# Patient Record
Sex: Female | Born: 1940 | Race: White | Hispanic: No | Smoking: Former smoker
Health system: Southern US, Community
[De-identification: ages and names within clinical notes are randomized; demographics above are authoritative.]

## PROBLEM LIST (undated history)

## (undated) DIAGNOSIS — Z9889 Other specified postprocedural states: Secondary | ICD-10-CM

## (undated) DIAGNOSIS — I951 Orthostatic hypotension: Secondary | ICD-10-CM

## (undated) DIAGNOSIS — I471 Supraventricular tachycardia, unspecified: Secondary | ICD-10-CM

## (undated) DIAGNOSIS — J449 Chronic obstructive pulmonary disease, unspecified: Secondary | ICD-10-CM

## (undated) DIAGNOSIS — M5410 Radiculopathy, site unspecified: Secondary | ICD-10-CM

## (undated) DIAGNOSIS — Z9289 Personal history of other medical treatment: Secondary | ICD-10-CM

## (undated) DIAGNOSIS — I10 Essential (primary) hypertension: Secondary | ICD-10-CM

## (undated) DIAGNOSIS — R51 Headache: Secondary | ICD-10-CM

## (undated) DIAGNOSIS — Z90710 Acquired absence of both cervix and uterus: Secondary | ICD-10-CM

## (undated) DIAGNOSIS — I671 Cerebral aneurysm, nonruptured: Secondary | ICD-10-CM

## (undated) DIAGNOSIS — R5383 Other fatigue: Secondary | ICD-10-CM

## (undated) DIAGNOSIS — I341 Nonrheumatic mitral (valve) prolapse: Secondary | ICD-10-CM

## (undated) DIAGNOSIS — F419 Anxiety disorder, unspecified: Secondary | ICD-10-CM

## (undated) HISTORY — DX: Chronic obstructive pulmonary disease, unspecified: J44.9

## (undated) HISTORY — PX: ABDOMINAL HYSTERECTOMY: SHX81

## (undated) HISTORY — DX: Essential (primary) hypertension: I10

## (undated) HISTORY — DX: Radiculopathy, site unspecified: M54.10

## (undated) HISTORY — DX: Supraventricular tachycardia: I47.1

## (undated) HISTORY — PX: NASAL SEPTUM SURGERY: SHX37

## (undated) HISTORY — DX: Nonrheumatic mitral (valve) prolapse: I34.1

## (undated) HISTORY — PX: CATARACT EXTRACTION: SUR2

## (undated) HISTORY — PX: CHOLECYSTECTOMY: SHX55

## (undated) HISTORY — DX: Acquired absence of both cervix and uterus: Z90.710

## (undated) HISTORY — PX: ROTATOR CUFF REPAIR: SHX139

## (undated) HISTORY — DX: Supraventricular tachycardia, unspecified: I47.10

## (undated) HISTORY — DX: Anxiety disorder, unspecified: F41.9

## (undated) HISTORY — PX: APPENDECTOMY: SHX54

## (undated) HISTORY — DX: Orthostatic hypotension: I95.1

## (undated) HISTORY — DX: Headache: R51

## (undated) HISTORY — DX: Personal history of other medical treatment: Z92.89

## (undated) HISTORY — DX: Other specified postprocedural states: Z98.890

## (undated) HISTORY — DX: Cerebral aneurysm, nonruptured: I67.1

## (undated) HISTORY — DX: Other fatigue: R53.83

---

## 1994-07-24 HISTORY — PX: CARDIAC CATHETERIZATION: SHX172

## 2000-08-23 ENCOUNTER — Ambulatory Visit (HOSPITAL_COMMUNITY): Admission: RE | Admit: 2000-08-23 | Discharge: 2000-08-23 | Payer: Self-pay | Admitting: Family Medicine

## 2001-05-25 ENCOUNTER — Encounter: Payer: Self-pay | Admitting: Emergency Medicine

## 2001-05-25 ENCOUNTER — Inpatient Hospital Stay (HOSPITAL_COMMUNITY): Admission: EM | Admit: 2001-05-25 | Discharge: 2001-05-26 | Payer: Self-pay | Admitting: Emergency Medicine

## 2002-04-14 ENCOUNTER — Encounter: Admission: RE | Admit: 2002-04-14 | Discharge: 2002-04-14 | Payer: Self-pay | Admitting: Orthopaedic Surgery

## 2002-04-14 ENCOUNTER — Encounter: Payer: Self-pay | Admitting: Orthopaedic Surgery

## 2003-05-18 ENCOUNTER — Ambulatory Visit (HOSPITAL_COMMUNITY): Admission: RE | Admit: 2003-05-18 | Discharge: 2003-05-18 | Payer: Self-pay | Admitting: Gastroenterology

## 2004-07-24 ENCOUNTER — Inpatient Hospital Stay (HOSPITAL_COMMUNITY): Admission: EM | Admit: 2004-07-24 | Discharge: 2004-07-25 | Payer: Self-pay | Admitting: Emergency Medicine

## 2006-10-13 ENCOUNTER — Emergency Department (HOSPITAL_COMMUNITY): Admission: EM | Admit: 2006-10-13 | Discharge: 2006-10-13 | Payer: Self-pay | Admitting: Emergency Medicine

## 2010-07-03 ENCOUNTER — Encounter: Admission: RE | Admit: 2010-07-03 | Discharge: 2010-07-03 | Payer: Self-pay | Admitting: Neurology

## 2011-01-09 NOTE — H&P (Signed)
[place]  Patient:    Barbara Hill, Barbara Hill Visit Number: [id number] MRN: [id number]          Service Type: MED Location: [phone] Attending Physician:  Devoria Albe Dictated by:   Marya Fossa, P.A. Admit Date:  05/25/2001   CC:         Richard A. Alanda Amass, M.D.   History and Physical  DATE OF BIRTH: 1941/02/14  [facility] MR#: [id number].  ADMISSION DIAGNOSES:  1. Chest pain, rule out myocardial infarction.  2. History of supraventricular tachycardia  3. Mitral valve prolapse.  4. History of normal coronary arteries, 1995.  5. Tobacco abuse.  6. Situational depression.  CHIEF COMPLAINT: Chest pain.  HISTORY OF PRESENT ILLNESS: This is a 70 year old widowed white female patient, with SVT and mitral valve prolapse and history of normal coronaries in 1995.  Around 8 a.m. today she developed substernal crushing chest pain through to her back and down to her waist.  No shortness of breath, nausea, or diaphoresis.  There was a warm sensation like indigestion and she tried Tums, Zantac, and Gas-X without relief.  This lasted about an hour.  She called EMS and was given one sublingual nitroglycerin with relief of her symptoms.  Her symptoms returned.  She was given a total of two more nitroglycerin and finally had relief.  She was at work when the symptoms started but she was not exerting herself at that time.  She has never felt this way before.  Of note, since Sunday she has been feeling like she is developing "flu-like symptoms," but has had no fever.  Her heart catheterization in 1995 was done secondary to new onset SVT/PAF.  ALLERGIES:  1. DARVOCET.  2. EPINEPHRINE.  MEDICATIONS:  1. Tambocor 100 mg b.i.d.  2. Toprol XL 50 mg q.d.  3. Premarin 0.9 mg q.d.  4. Prozac 20 mg q.d.  5. Allegra as needed.  6. No aspirin. (No reason).  PAST MEDICAL HISTORY:  1. SVT/PAF.  2. Catheterization December 1995,  with normal coronaries and normal LV.  3. A 2D echocardiogram in November 2001 with normal EF.  Mitral valve     prolapse, mild MR, left atrial enlargement, mild pulmonary hypertension.  4. Persantine Cardiolite August 2001 with no ischemia, EF normal.  5. SBE prophylaxis.  6. History of right rotator cuff repair, hysterectomy, appendectomy,     cholecystectomy.  FAMILY HISTORY: Mother died at age 67 of MI and TIA.  Father alive at age 34 and well.  She has three brothers who are alive and well.  One is deceased, he died four weeks ago of heart trouble.  One sister alive and well.  SOCIAL HISTORY: The patient is widowed x 4 weeks.  Her husband was disabled and sick for some time, and died approximately four weeks ago of heart disease.  She is the mother of two, grandmother of four.  She smokes over a pack a day, and lives with her 38 year old grandson.  She states she is coping with the death of her brother and husband, who died one day between each other.  She thinks she is still in shock.  REVIEW OF SYSTEMS: No fever.  Positive chills.  No cough.  Positive depression.  Positive indigestion, which has been worse lately.  No weight changes.  No melena or hematuria.  PHYSICAL EXAMINATION:  VITAL SIGNS: Blood pressure 121/48, pulse 62, respirations 24.  GENERAL: Alert and oriented x  3.  No acute distress.  HEENT: Normocephalic, atraumatic.  PERRL.  EOMI.  Nares patent.  Pharynx clear.  NECK: Supple without bruits or masses.  LUNGS: Clear to auscultation.  COR: Regular rate and rhythm without murmurs, rubs, or gallops.   Nontender to palpation.  ABDOMEN: Soft, nontender, nondistended.  Normoactive bowel sounds x 4 quadrants.  No HSM.  No bruits.  EXTREMITIES: Right femoral bruit, which is faint; 2+ femoral pulses; 2+ distal pulses; without edema.  NEUROLOGIC: Examination nonfocal.  LABORATORY DATA: EKG shows sinus rhythm with poor R wave progression in V leads.  Will  check old EKG.  Chest x-ray shows bibasilar atelectasis, no infiltrates, no CHF.  WBC 9.0, hemoglobin 13.0, platelets 339,000.  INR 1.1.  Potassium 3.6, BUN 12, creatinine 0.8, glucose 103.  Amylase 82, lipase 28.  UA negative.  CK 25, MB 0.3.  Troponin I less than 0.01.  IMPRESSION:  1. Chest pain, worrisome for angina.  2. Tobacco abuse.  3. History of supraventricular tachycardia.  4. Mitral valve prolapse.  5. Unknown lipid status.  6. Depression, situational.  7. Hypokalemia, borderline.  PLAN: Will admit the patient.  Will treat her with IV heparin, IV nitroglycerin, and IV fluids.  Beta-blocker, aspirin, Plavix.  Consider ACE inhibitor therapy, especially if she has blockages.  Will check a fasting lipid profile and follow serial cardiac enzymes.  I have discussed tobacco cessation and will start her on Wellbutrin.  Depression.  I have asked her if she would like to speak to somebody about her depression, and she states she is all right at this time.  She has some counselors at work that she is planning to see.  We will plan cardiac catheterization tomorrow. Dictated by:   Marya Fossa, P.A. Attending Physician:  Devoria Albe DD:  05/25/01 TD:  05/25/01 Job: [id number]

## 2011-01-09 NOTE — Discharge Summary (Signed)
NAMEANNELIES, COYT                ACCOUNT NO.:  [id number]   MEDICAL RECORD NO.:  [id number]          PATIENT TYPE:  INP   LOCATION:  3731                         FACILITY:  [facility]   PHYSICIAN:  Darlin Priestly, MD  DATE OF BIRTH:  [birth year]   DATE OF ADMISSION:  07/24/2004  DATE OF DISCHARGE:  07/25/2004                                 DISCHARGE SUMMARY   DISCHARGE DIAGNOSES:  1.  Chest pain, myocardial infarction ruled out.  2.  History of normal coronaries in October of 2002.  3.  Normal left ventricular function.  4.  Documented gastritis by prior endoscopy in September of 2004.  Seen by      Anselmo Rod, M.D., at that time.  5.  Smoking.  6.  History of supraventricular tachycardia treated with flecainide and      Toprol.   HISTORY OF PRESENT ILLNESS:  The patient is a 70 year old female followed by  Dr. Jenne Campus and Rema Fendt, F.N.P., with a history of SVT.  She was  admitted on July 24, 2004, with chest pain.  She apparently had not been  on a PPI.  She was recently started on an antibiotic for sinusitis.   HOSPITAL COURSE:  She was admitted to telemetry, started on heparin and  ruled out for an MI.  We feel that she can be discharged on July 25, 2004.  She has an outpatient Cardiolite study scheduled for next week,  Wednesday, July 30, 2004, at 12:30 p.m.  Helicobacter pylori was done  prior to discharge.  She also had a full TFT profile one prior to discharge.  Her TSH is 0.47.  Followup TFTs are pending.   DISCHARGE MEDICATIONS:  1.  Tambocor 100 mg twice a day.  2.  Toprol XL 50 mg a day.  3.  Amoxicillin 500 mg twice a day for six more days.  4.  Aspirin 81 mg a day.  5.  Protonix 40 mg twice a day for one month and then once a day.   LABORATORIES:  CK-MBs and troponins are negative.  INR is 1.3.  The TSH is  0.47.  Sodium 136, potassium 3.7, BUN 7, creatinine 0.6.  Liver functions  are normal.  Lipase is normal.  White count 6.3, hemoglobin  13.7, hematocrit  39.2, platelets 307.  The differential is unremarkable.  EKG revealed sinus  bradycardia with a rate of 55.  The QTC is 419.  CT of her chest revealed no  aortic dissection or aneurysm and small bilateral lung nodules most likely  granuloma.  CT of the abdomen showed no acute abnormality.   DISPOSITION:  The patient is discharged in stable condition.   FOLLOWUP:  Will follow up with Dr. Jenne Campus on Wednesday, August 06, 2004.  She will have a Cardiolite study done on Wednesday, July 30, 2004, at  12:30 p.m.      Antonieta Loveless  D:  07/25/2004  T:  07/26/2004  Job:  [id number]   cc:   Rema Fendt, F.N.P.  [facility]

## 2011-01-09 NOTE — Op Note (Signed)
NAME:  Barbara Hill, Barbara Hill                          ACCOUNT NO.:  [id number]   MEDICAL RECORD NO.:  [id number]                   PATIENT TYPE:  AMB   LOCATION:  ENDO                                 FACILITY:  [facility]   PHYSICIAN:  Anselmo Rod, M.D.               DATE OF BIRTH:  [birth year]   DATE OF PROCEDURE:  05/18/2003  DATE OF DISCHARGE:                                 OPERATIVE REPORT   PROCEDURE:  Screening colonoscopy.   ENDOSCOPIST:  Anselmo Rod, M.D.   INSTRUMENT USED:  Olympus video colonoscope.   INDICATIONS FOR PROCEDURE:  Abnormal weight loss with a family history of  colon cancer in a maternal aunt in a  70 year old white female rule out  colonic polyps, masses, etc.   PREPROCEDURE PREPARATION:  Informed consent was obtained from the patient  and the patient was  fasted for 8 hours prior to  the procedure and prepped  with a bottle of magnesium citrate and a gallon of GoLYTELY the  night prior  to the procedure.   PREPROCEDURE PHYSICAL:  The patient had stable vital signs. Neck supple.  Chest clear to auscultation, S1, S2 regular. Abdomen soft with normoactive  bowel sounds.   DESCRIPTION OF PROCEDURE:  The patient was placed in the left lateral  decubitus position and sedated with an additional 40  mg of Demerol and 4 mg  of Versed intravenously. Once sedation was adequate, the patient was  maintained on low flow oxygen and continuous cardiac monitoring.   The Olympus video colonoscope was advanced from the rectum to the cecum with  difficulty. The patient had a very tortuous colon. The patient's position  was changed from the left lateral  to the supine and the right lateral  position  with gentle application of abdominal  pressure to reach the cecal  base. No masses, polyps, erosions or ulcerations or diverticula were seen.  Small internal hemorrhoids were appreciated on retroflexion in the rectum.  The appendiceal orifice and ileocecal valve were  visualized and  photographed.   IMPRESSION:  1. Normal colonoscopy up to the cecum except for a tortuous colon.  2. Small nonbleeding internal hemorrhoids seen on retroflexion.  3. No masses or polyps seen, no evidence of diverticulosis.    RECOMMENDATIONS:  1. Continue a high fiber diet with liberal fluid intake.  2. Repeat colorectal cancer in the next 5 years unless the patient develops     any abnormal symptoms in the interim.  3. Outpatient follow up in the next 2 weeks for further workup of abnormal     weight loss.                                               Anselmo Rod, M.D.  JNM/MEDQ  D:  05/18/2003  T:  05/19/2003  Job:  [id number]   cc:   Teena Irani. Arlyce Dice, M.D.  [address]  [place]  [place] [address]  Fax: [phone]

## 2011-01-09 NOTE — Discharge Summary (Signed)
[place]. [facility]  Patient:    Barbara Hill, Barbara Hill Visit Number: [id number] MRN: [id number]          Service Type: MED Location: [phone] Attending Physician:  Berry, Jonathan [place] Dictated by:   Halford Decamp Delanna Ahmadi, R.N., N.P. Admit Date:  05/25/2001 Discharge Date: 05/26/2001   CC:         [facility]   Discharge Summary  HISTORY OF PRESENT ILLNESS:  Barbara Hill is a 70 year old white widowed female patient of Dr. Pearletha Furl. Alanda Amass, who came in to the hospital with an indigestion-type pain occurring at 8 oclock in the morning.  She took Tums and Zantac and Gas-X without any relief.  She had crushing substernal chest pain to her back and down to her waist.  She had no shortness of breath, no nausea, and no diaphoresis.  The duration lasted one hour.  She took a sublingual nitroglycerin by EMS and the pain was relieved.  She was given two more nitroglycerin with relief.  She apparently never had this pain in the past.  She did have a catheterization in 1995 where she had some SVT and the catheter was negative.  HOSPITAL COURSE:  She was admitted, put on IV heparin, IV nitroglycerin, a beta blocker, aspirin, and Plavix and she was seen by Dr. Madaline Savage who thought she should undergo cardiac catheterization.  This was performed on May 26, 2001 by Dr. Lennette Bihari.  Coronaries were clean.  She had no CAD.  She had mild MVP.  She had normal LV function.  Thus, it was decided that she could be discharged home later in the day.  Her discharge at this time is pending.  Also to note, she has had a lot of family stress.  Apparently, her husband died four weeks ago and a brother died the day after, which may be a playing symptomatology with her chest pain.  DISCHARGE MEDICATIONS: 1. Enteric-coated aspirin 325 mg 1 q.d. 2. Flecainide 100 mg b.i.d. 3. Prozac 20 mg 1 q.d. 4. Protonix 40 mg 1 q.d. 5. Toprol XL 50 mg 1  q.d.  LABORATORY DATA:  May 26, 2001, a hemoglobin was 11.6, hematocrit 34.2, wbcs 6.0, platelets were 274,000.  Sodium was 139, potassium was 3.5, glucose was 104, BUN was 7, creatinine was 0.6.  CK-MBs were negative x 3.  There is no chest x-ray in the chart at the time of this dictation.  The one on the computer says that she has bilateral basilar atelectasis, borderline cardiomegaly.  DISCHARGE DIAGNOSES: 1. Chest pain, not cardiac ischemia etiology with no cors by cardiac    catheterization May 26, 2001. 2. History of supraventricular tachycardia on flecainide. 3. Depression. 4. Grieving process. 5. Indigestion.  ACTIVITY:  Her activity at the time of discharge is to do no strenuous activity, no driving, no lifting for four days.  FOLLOW-UP:  She will follow up with Mancel Bale, P.A., at 10:20 on June 09, 2001. Dictated by:   Halford Decamp Delanna Ahmadi, R.N., N.P. Attending Physician:  Berry, Jonathan [place] DD:  05/26/01 TD:  05/26/01 Job: [id number]

## 2011-01-09 NOTE — Op Note (Signed)
NAME:  Barbara Hill, Barbara Hill                          ACCOUNT NO.:  [id number]   MEDICAL RECORD NO.:  [id number]                   PATIENT TYPE:  AMB   LOCATION:  ENDO                                 FACILITY:  [facility]   PHYSICIAN:  Anselmo Rod, M.D.               DATE OF BIRTH:  07-03-1941   DATE OF PROCEDURE:  05/18/2003  DATE OF DISCHARGE:                                 OPERATIVE REPORT   PROCEDURE:  Esophagogastroduodenoscopy.   ENDOSCOPIST:  Anselmo Rod, M.D.   INSTRUMENT USED:  Olympus video panendoscope.   INDICATIONS FOR PROCEDURE:  A 70 year old white female with a history of  abnormal weight loss, undergoing EGD to rule out peptic ulcer disease,  esophagitis, gastritis, etc. The patient has had a history of occasional  epigastric discomfort.   PREPROCEDURE PREPARATION:  Informed consent was obtained from the patient.  The patient was  fasted for 8 hours prior to the procedure and given Cipro  400 mg intravenously for MVP prophylaxis prior to the procedure.   PREPROCEDURE PHYSICAL:  The patient had stable vital signs, neck supple,  chest clear to auscultation, S1, S2 regular, abdomen soft with normal bowel  sounds.   DESCRIPTION OF PROCEDURE:  The patient was placed in the left lateral  decubitus position and sedated with 40 mg of Demerol, and 4 mg of Versed  intravenously. Once the patient was adequately sedated and maintained on low  flow oxygen and continuous cardiac monitoring, the Olympus video  panendoscope was advanced through the mouth piece over the tongue into the  esophagus under direct visualization.   The entire esophagus appeared normal with no evidence of rings, stricture,  masses, esophagitis or Barrett's mucosa. The scope was then advanced into  the stomach. Mild, diffuse gastritis was noted throughout the gastric mucosa  but no ulcers, erosions, masses or polyps were seen. Retroflexion in the  high cardia revealed  no abnormalities. The proximal  small bowel appeared  normal as well.   IMPRESSION:  Normal esophagogastroduodenoscopy except for mild diffuse  gastritis. No ulcers or masses seen.   RECOMMENDATIONS:  1. Trial of H2 blockers like Pepcid or Zantac has been recommended.  2. Avoid nonsteroidals including aspirin .  3.     Follow anti reflux measures.  4. Proceed with a colonoscopy at this time. Further  recommendations will be     made after the colonoscopy has been done.                                               Anselmo Rod, M.D.    JNM/MEDQ  D:  05/18/2003  T:  05/19/2003  Job:  [id number]   cc:   Teena Irani. Arlyce Dice, M.D.  [address]  [place]  [place] [address]  Fax: [phone]

## 2011-01-09 NOTE — Cardiovascular Report (Signed)
[place]. [facility]  Patient:    Barbara Hill, Barbara Hill Visit Number: [id number] MRN: [id number]          Service Type: MED Location: [phone] Attending Physician:  Berry, Jonathan [place] Dictated by:   Lennette Bihari, M.D., [facility] Proc. Date: 05/26/01 Admit Date:  05/25/2001   CC:         Richard A. Alanda Amass, M.D.  Cardiac Catheterization Lab  Janeece Riggers  [facility]   Cardiac Catheterization  PROCEDURE: 1. Left heart catheterization. 2. Cine coronary angiography. 3. Biplane cine left ventriculography. 4. Distal aortography.  CARDIOLOGIST:  Lennette Bihari, M.D.  INDICATIONS:  Barbara Hill is a 70 year old white female with a history of mitral valve prolapse and documented supraventricular tachycardia on Tambocor and Toprol.  She has ongoing tobacco use.  The patient was admitted to [facility] yesterday with a symptom complex worrisome for possible unstable angina.  She was started on anticoagulation and IV nitroglycerin.  She is now referred for definitive diagnostic catheterization.  HEMODYNAMIC DATA: 1. Central aortic pressure was 165/76. 2. Left ventricular pressure 165/24.  ANGIOGRAPHIC DATA:  Left main coronary artery was angiographically normal and bifurcated into an LAD and left circumflex system.  The LAD was angiographically normal and gave rise to a major proximal diagonal vessel and several small septal perforating arteries.  The circumflex vessel was angiographically normal and gave rise to a bifurcating obtuse marginal vessel.  The right coronary artery was angiographically normal and gave rise to a PDA and posterolateral vessel.  Biplane cine left ventriculography revealed normal LV function without focal segmental wall motion abnormalities.  There was a mild angiographic mitral valve prolapse.  Distal aortography did not demonstrate any significant aortoiliac  disease. There was no evidence for renal artery stenosis.  IMPRESSION: 1. Normal left ventricular function. 2. Mild angiographic mitral valve prolapse. 3. Normal coronary arteries. 4. Normal aortoiliac system. Dictated by:   Lennette Bihari, M.D., [facility] Attending Physician:  Berry, Jonathan [place] DD:  05/26/01 TD:  05/26/01 Job: [id number]

## 2011-04-17 ENCOUNTER — Encounter: Payer: Self-pay | Admitting: Internal Medicine

## 2011-04-20 ENCOUNTER — Encounter: Payer: Self-pay | Admitting: Internal Medicine

## 2011-04-20 ENCOUNTER — Ambulatory Visit (INDEPENDENT_AMBULATORY_CARE_PROVIDER_SITE_OTHER): Payer: Medicare PPO | Admitting: Internal Medicine

## 2011-04-20 DIAGNOSIS — I471 Supraventricular tachycardia: Secondary | ICD-10-CM

## 2011-04-20 DIAGNOSIS — I498 Other specified cardiac arrhythmias: Secondary | ICD-10-CM

## 2011-04-20 DIAGNOSIS — I059 Rheumatic mitral valve disease, unspecified: Secondary | ICD-10-CM

## 2011-04-20 DIAGNOSIS — I119 Hypertensive heart disease without heart failure: Secondary | ICD-10-CM

## 2011-04-20 DIAGNOSIS — F17200 Nicotine dependence, unspecified, uncomplicated: Secondary | ICD-10-CM

## 2011-04-20 MED ORDER — FLECAINIDE ACETATE 100 MG PO TABS: 100.0000 mg | ORAL_TABLET | Freq: Two times a day (BID) | ORAL | Status: DC

## 2011-04-20 NOTE — Patient Instructions (Signed)
Your physician has requested that you have a lexiscan myoview. For further information please visit [email or web address]. Please follow instruction sheet, as given.   

## 2011-04-20 NOTE — Progress Notes (Signed)
HPI Patient is a 70 year old who is referred for evaluation of palpitations.  Seen by Laurell Josephs.  Last seen in 2005. Has been on Tambocor rx'd by R. Alanda Amass.  Has done wonderfully.   Last spell of palpitations in 1996.    Occasaional beats a little fast if drinks caffeine.  Tries to avoid.  Last spell of it beating a little fast was over 1 year ago.  Shortlived. No chest pains.  Breathing ok.  Active.    Allergies  Allergen Reactions  . Biaxin   . Darvocet (Propoxyphene N-Acetaminophen)   . Doxycycline   . Epinephrine   . Penicillins   . Prednisone     Current Outpatient Prescriptions  Medication Sig Dispense Refill  . ALPRAZolam (XANAX) 0.5 MG tablet Take 0.5 mg by mouth 2 (two) times daily.       . butalbital-acetaminophen-caffeine (FIORICET WITH CODEINE) 50-325-40-30 MG per capsule Take 1 capsule by mouth every 4 (four) hours as needed.        . cyanocobalamin 1000 MCG tablet Take 100 mcg by mouth daily.        . fish oil-omega-3 fatty acids 1000 MG capsule Take 2 g by mouth daily.        . flecainide (TAMBOCOR) 100 MG tablet Take 100 mg by mouth 2 (two) times daily.        Marland Kitchen FLUoxetine (PROZAC) 40 MG capsule Take 40 mg by mouth daily.        Marland Kitchen lisinopril-hydrochlorothiazide (PRINZIDE,ZESTORETIC) 20-12.5 MG per tablet Take 1 tablet by mouth daily.        . metoprolol (TOPROL-XL) 50 MG 24 hr tablet Take 50 mg by mouth daily.        Marland Kitchen omeprazole (PRILOSEC) 40 MG capsule Take 40 mg by mouth daily.        . vitamin E 400 UNIT capsule Take 400 Units by mouth daily.          Past Medical History  Diagnosis Date  . Arrhythmia     palps  . Fatigue   . Dizziness   . Hypertension   . SVT (supraventricular tachycardia)   . Mitral valve prolapse   . Headache   . UTI (lower urinary tract infection)   . Anxiety   . COPD (chronic obstructive pulmonary disease)   . Radiculopathy     No past surgical history on file.  Family History  Problem Relation Age of Onset  . Heart  attack Other   . Asthma Other   . Heart failure Other   . Osteoporosis Other     History   Social History  . Marital Status: Divorced    Spouse Name: N/A    Number of Children: N/A  . Years of Education: N/A   Occupational History  . Not on file.   Social History Main Topics  . Smoking status: Current Everyday Smoker  . Smokeless tobacco: Not on file  . Alcohol Use:   . Drug Use:   . Sexually Active:    Other Topics Concern  . Not on file   Social History Narrative  . No narrative on file    Review of Systems:  All systems reviewed.  They are negative to the above problem except as previously stated. Still smoking   Trying to quit.   Less than a ppd. Dizziness with otitis media.  Picking up antibotics today. Having fevers and chills. Not dizzy prior.  Vital Signs: BP 92/60  Pulse 52  Ht 5\' 2"  (1.575 m)  Wt 130 lb (58.968 kg)  BMI 23.78 kg/m2  Physical Exam Patient a thin 70 yo in NAD  HEENT:  Normocephalic, atraumatic. EOMI, PERRLA.  Neck: JVP is normal. No thyromegaly. No bruits.  Lungs: clear to auscultation. No rales no wheezes.   Rhonchi that clear with cough. Heart: Regular rate and rhythm. Normal S1, S2. No S3.   No significant murmurs. PMI not displaced.  Abdomen:  Supple, nontender. Normal bowel sounds. No masses. No hepatomegaly.  Extremities:   Good distal pulses throughout. No lower extremity edema.  Musculoskeletal :moving all extremities.  Neuro:   alert and oriented x3.  CN II-XII grossly intact.  EKG: Sinus bradycardia.  59 bpm.  First degree AV block.   Assessment and Plan:

## 2011-04-21 DIAGNOSIS — I059 Rheumatic mitral valve disease, unspecified: Secondary | ICD-10-CM | POA: Insufficient documentation

## 2011-04-21 DIAGNOSIS — I471 Supraventricular tachycardia: Secondary | ICD-10-CM | POA: Insufficient documentation

## 2011-04-21 DIAGNOSIS — F17200 Nicotine dependence, unspecified, uncomplicated: Secondary | ICD-10-CM | POA: Insufficient documentation

## 2011-04-21 NOTE — Assessment & Plan Note (Signed)
No murmur or click on exam.  If has MVP must be intermitt and mild.  Will review outside records.

## 2011-04-21 NOTE — Assessment & Plan Note (Signed)
Patient doing well on current regimen.  I have asked her to sign a release of information to get records for [facility].  I have discussed with J Allred. Given her age I would recomm a stress myoview to rule out inducible ischemia.  Plan periodic f/u after.

## 2011-04-21 NOTE — Assessment & Plan Note (Signed)
Counselled on tobacco risks and quitting. Rec she have ABx filled

## 2011-04-29 ENCOUNTER — Encounter: Payer: Self-pay | Admitting: *Deleted

## 2011-05-04 ENCOUNTER — Encounter: Payer: Self-pay | Admitting: Internal Medicine

## 2011-05-06 ENCOUNTER — Other Ambulatory Visit (HOSPITAL_COMMUNITY): Payer: Self-pay | Admitting: Radiology

## 2011-05-07 ENCOUNTER — Ambulatory Visit (HOSPITAL_COMMUNITY): Payer: Medicare PPO | Attending: Internal Medicine | Admitting: Radiology

## 2011-05-07 VITALS — Ht 63.0 in | Wt 130.0 lb

## 2011-05-07 DIAGNOSIS — I10 Essential (primary) hypertension: Secondary | ICD-10-CM | POA: Insufficient documentation

## 2011-05-07 DIAGNOSIS — R002 Palpitations: Secondary | ICD-10-CM

## 2011-05-07 DIAGNOSIS — R0989 Other specified symptoms and signs involving the circulatory and respiratory systems: Secondary | ICD-10-CM

## 2011-05-07 MED ORDER — TECHNETIUM TC 99M TETROFOSMIN IV KIT
11.0000 | PACK | Freq: Once | INTRAVENOUS | Status: AC | PRN
  Administered 2011-05-07: 11 via INTRAVENOUS

## 2011-05-07 MED ORDER — ATROPINE SULFATE 0.1 MG/ML IJ SOLN
1.0000 mg | Freq: Once | INTRAMUSCULAR | Status: AC
  Administered 2011-05-07: 1 mg via INTRAVENOUS

## 2011-05-07 MED ORDER — SODIUM CHLORIDE 0.9 % IV SOLN
40.0000 ug/kg | Freq: Once | INTRAVENOUS | Status: AC
  Administered 2011-05-07: 40 ug/kg/min via INTRAVENOUS

## 2011-05-07 NOTE — Progress Notes (Signed)
[facility] SITE 3 NUCLEAR MED [address] [place] [address] [phone]  Cardiology Nuclear Med Study  ALYRIA KRACK is a 70 y.o. female [id number] 07-29-1941   Nuclear Med Background Indication for Stress Test:  Evaluation for Ischemia History:   Cardiac Risk Factors: Family History - CAD, Hypertension and Smoker  Symptoms:     Nuclear Pre-Procedure Caffeine/Decaff Intake:  5:00am NPO After: 11:30pm   Lungs:  *** IV 0.9% NS with Angio Cath:  20g  IV Site: R Antecubital  IV Started by:  Stanton Kidney, EMT-P  Chest Size (in):  34 Cup Size: B  Height: 5\' 3"  (1.6 m)  Weight:  130 lb (58.968 kg)  BMI:  Body mass index is 23.03 kg/(m^2). Tech Comments:  Toprol was taken yesterday pm . (Fioricet was taken this am @ 5am, per patient). Dobutamine infusion up to 40 mcg given with 1mg  Atropine, unable to achieve THR, patient symptomatic and infusion stopped.  Patient rescheduled for Lexiscan.    Nuclear Med Study 1 or 2 day study:   Stress Test Type:    Reading MD:   Order Authorizing Provider:  ***  Resting Radionuclide: Technetium 38m Tetrofosmin  Resting Radionuclide Dose: 11.0 mCi   Stress Radionuclide:  Technetium 38m Tetrofosmin  Stress Radionuclide Dose: 33.0 mCi           Stress Protocol Rest HR: *** Stress HR: ***  Rest BP: *** Stress BP: ***  Exercise Time (min):  METS:           Dose of Adenosine (mg):   Dose of Lexiscan:  mg  Dose of Atropine (mg):  Dose of Dobutamine:  mcg/kg/min (at max HR)  Stress Test Technologist:   Nuclear Technologist:  Domenic Polite, CNMT     Rest Procedure:  Myocardial perfusion imaging was performed at rest 45 minutes following the intravenous administration of Technetium 55m Tetrofosmin. Rest ECG:   Stress Procedure:   Stress ECG:   QPS Raw Data Images:   Stress Images:   Rest Images:   Subtraction (SDS):   Transient Ischemic Dilatation (Normal <1.22):  1.01 Lung/Heart Ratio (Normal <0.45):   0.31  Quantitative Gated Spect Images QGS EDV:  74 ml QGS ESV:  16 ml QGS cine images:   QGS EF: 78%  Impression Exercise Capacity:   BP Response:   Clinical Symptoms:   ECG Impression:   Comparison with Prior Nuclear Study:   Overall Impression:

## 2011-05-11 ENCOUNTER — Telehealth: Payer: Self-pay | Admitting: Internal Medicine

## 2011-05-11 NOTE — Telephone Encounter (Signed)
Pt has myoview scheduled for tomorrow, wants to talk to dr Tenny Craw

## 2011-05-12 ENCOUNTER — Other Ambulatory Visit (HOSPITAL_COMMUNITY): Payer: Medicare PPO | Admitting: Radiology

## 2011-05-12 NOTE — Telephone Encounter (Signed)
Mail box full. LM to call back on home phone.

## 2011-05-13 ENCOUNTER — Telehealth (HOSPITAL_COMMUNITY): Payer: Self-pay

## 2011-05-13 NOTE — Telephone Encounter (Signed)
Spoke with patient about rescheduling lexiscan myoview. The patient was changed from lexiscan to Dobutamine due to taking fioricet.The patient does not want Dobutamine again due to heart beating too hard, chest and neck pain, and weakness. Rescheduled [employer]Jeraline Marcinek,RN

## 2011-05-25 ENCOUNTER — Ambulatory Visit (HOSPITAL_COMMUNITY): Payer: Medicare PPO | Attending: Internal Medicine | Admitting: Radiology

## 2011-05-25 DIAGNOSIS — R0602 Shortness of breath: Secondary | ICD-10-CM

## 2011-05-25 DIAGNOSIS — R55 Syncope and collapse: Secondary | ICD-10-CM | POA: Insufficient documentation

## 2011-05-25 DIAGNOSIS — R0989 Other specified symptoms and signs involving the circulatory and respiratory systems: Secondary | ICD-10-CM

## 2011-05-25 DIAGNOSIS — R42 Dizziness and giddiness: Secondary | ICD-10-CM

## 2011-05-25 DIAGNOSIS — I10 Essential (primary) hypertension: Secondary | ICD-10-CM | POA: Insufficient documentation

## 2011-05-25 DIAGNOSIS — R9431 Abnormal electrocardiogram [ECG] [EKG]: Secondary | ICD-10-CM

## 2011-05-25 DIAGNOSIS — R5381 Other malaise: Secondary | ICD-10-CM | POA: Insufficient documentation

## 2011-05-25 DIAGNOSIS — Z8249 Family history of ischemic heart disease and other diseases of the circulatory system: Secondary | ICD-10-CM | POA: Insufficient documentation

## 2011-05-25 DIAGNOSIS — R0609 Other forms of dyspnea: Secondary | ICD-10-CM | POA: Insufficient documentation

## 2011-05-25 DIAGNOSIS — R0789 Other chest pain: Secondary | ICD-10-CM

## 2011-05-25 DIAGNOSIS — R002 Palpitations: Secondary | ICD-10-CM | POA: Insufficient documentation

## 2011-05-25 DIAGNOSIS — F17200 Nicotine dependence, unspecified, uncomplicated: Secondary | ICD-10-CM | POA: Insufficient documentation

## 2011-05-25 MED ORDER — REGADENOSON 0.4 MG/5ML IV SOLN
0.4000 mg | Freq: Once | INTRAVENOUS | Status: AC
  Administered 2011-05-25: 0.4 mg via INTRAVENOUS

## 2011-05-25 MED ORDER — TECHNETIUM TC 99M TETROFOSMIN IV KIT
33.0000 | PACK | Freq: Once | INTRAVENOUS | Status: AC | PRN
  Administered 2011-05-25: 33 via INTRAVENOUS

## 2011-05-25 NOTE — Progress Notes (Signed)
[facility] SITE 3 NUCLEAR MED [address] [place] [address] [phone]  Cardiology Nuclear Med Study  Barbara Hill is a 70 y.o. female [id number] [birth year]   Nuclear Med Background Indication for Stress Test:  Evaluation for Ischemia History:  COPD,02 Heart Catheterization:normal and 05 Myocardial Perfusion Study: normal per patient at [facility] Cardiac Risk Factors: Family History - CAD, Hypertension and Smoker  Symptoms:  Dizziness, DOE, Fatigue, Near Syncope and Palpitations   Nuclear Pre-Procedure Caffeine/Decaff Intake:  8:00pm NPO After: 7:00am   Lungs:  clear IV 0.9% NS with Angio Cath:  22g  IV Site: R Hand  IV Started by:  Doyne Keel, CNMT  Chest Size (in):  34 Cup Size: B  Height: 5\' 3"  (1.6 m)  Weight:  130 lb (58.968 kg)  BMI:  Body mass index is 23.03 kg/(m^2). Tech Comments: 05/07/11 Toprol was taken yesterday pm . (Fioricet was taken this am @ 5am, per patient). Dobutamine infusion up to 40 mcg given with 1mg  Atropine, unable to achieve THR, patient symptomatic and infusion stopped.  Patient rescheduled for Lexiscan. 05/25/11 Toprol held x 17 hrs.    Nuclear Med Study 1 or 2 day study: 1 day  Stress Test Type:  Lexiscan  Reading MD: Cassell Clement, MD  Order Authorizing Provider:  Gunnar Fusi Ross,MD  Resting Radionuclide: Technetium 73m Tetrofosmin  Resting Radionuclide Dose: 11.0 mCi-given 05/07/11   Stress Radionuclide:  Technetium 67m Tetrofosmin  Stress Radionuclide Dose: 33.0 mCi           Stress Protocol Rest HR: 54 Stress HR: 82  Rest BP: 171/85 Stress BP: 191/86  Exercise Time (min): n/a METS: n/a   Predicted Max HR: 151 bpm % Max HR: 54.3 bpm Rate Pressure Product: 95284   Dose of Adenosine (mg):  n/a Dose of Lexiscan: 0.4 mg  Dose of Atropine (mg): n/a Dose of Dobutamine: n/a mcg/kg/min (at max HR)  Stress Test Technologist: Cathlyn Parsons, RN  Nuclear Technologist:  Domenic Polite, CNMT     Rest Procedure:   Myocardial perfusion imaging was performed at rest 45 minutes following the intravenous administration of Technetium 75m Tetrofosmin. Rest ECG: Sinus Bradycardia with first degree AV block  Stress Procedure:  The patient received IV Lexiscan 0.4 mg over 15-seconds.  Technetium 45m Tetrofosmin injected at 30-seconds. Patient had chest pressure with infusion.  There were no significant changes with Lexiscan.  Quantitative spect images were obtained after a 45 minute delay. Stress ECG: No significant change from baseline ECG  QPS Raw Data Images:  Normal; no motion artifact; normal heart/lung ratio. Stress Images:  Normal homogeneous uptake in all areas of the myocardium. Rest Images:  Normal homogeneous uptake in all areas of the myocardium. Subtraction (SDS):  No evidence of ischemia. Transient Ischemic Dilatation (Normal <1.22):  1.01 Lung/Heart Ratio (Normal <0.45):  0.31  Quantitative Gated Spect Images QGS EDV:  74 ml QGS ESV:  16 ml QGS cine images:  NL LV Function; NL Wall Motion QGS EF: 78%  Impression Exercise Capacity:  Lexiscan with low level exercise. BP Response:  Normal blood pressure response. Clinical Symptoms:  Mild chest pain/dyspnea. ECG Impression:  No significant ST segment change suggestive of ischemia. Comparison with Prior Nuclear Study: No images to compare  Overall Impression:  Normal stress nuclear study.    Cassell Clement

## 2011-05-28 ENCOUNTER — Telehealth: Payer: Self-pay | Admitting: Internal Medicine

## 2011-05-28 NOTE — Telephone Encounter (Signed)
Pt calling for results of stress test °

## 2011-05-28 NOTE — Telephone Encounter (Signed)
Spoke with pt and gave her preliminary results of stress test. I told her we would call her back after Dr. Tenny Craw reviews.

## 2011-06-01 ENCOUNTER — Encounter: Payer: Self-pay | Admitting: Internal Medicine

## 2011-06-22 NOTE — Telephone Encounter (Signed)
Called patient and advised that Dr.Ross reviewed stress test and no changes are to be made.

## 2011-07-12 ENCOUNTER — Ambulatory Visit: Payer: Self-pay

## 2011-09-17 ENCOUNTER — Ambulatory Visit: Payer: Self-pay | Admitting: Internal Medicine

## 2012-02-21 ENCOUNTER — Emergency Department: Payer: Self-pay | Admitting: Emergency Medicine

## 2012-02-21 LAB — URINALYSIS, COMPLETE
Bilirubin,UR: NEGATIVE
Blood: NEGATIVE
Leukocyte Esterase: NEGATIVE
Nitrite: POSITIVE
Specific Gravity: 1.015 (ref 1.003–1.030)
Squamous Epithelial: 2
WBC UR: 1 /HPF (ref 0–5)

## 2012-09-07 ENCOUNTER — Emergency Department: Payer: Self-pay | Admitting: Emergency Medicine

## 2012-09-07 LAB — CBC
HCT: 37.8 % (ref 35.0–47.0)
MCH: 29.5 pg (ref 26.0–34.0)
MCHC: 32.6 g/dL (ref 32.0–36.0)
MCV: 91 fL (ref 80–100)
Platelet: 258 10*3/uL (ref 150–440)
RDW: 13.2 % (ref 11.5–14.5)
WBC: 12.1 10*3/uL — ABNORMAL HIGH (ref 3.6–11.0)

## 2012-09-08 LAB — COMPREHENSIVE METABOLIC PANEL
Alkaline Phosphatase: 95 U/L (ref 50–136)
Anion Gap: 7 (ref 7–16)
Calcium, Total: 8.5 mg/dL (ref 8.5–10.1)
Creatinine: 0.79 mg/dL (ref 0.60–1.30)
EGFR (African American): >60
Glucose: 105 mg/dL — ABNORMAL HIGH (ref 65–99)
Potassium: 3.6 mmol/L (ref 3.5–5.1)
SGOT(AST): 16 U/L (ref 15–37)
Sodium: 140 mmol/L (ref 136–145)
Total Protein: 7.4 g/dL (ref 6.4–8.2)

## 2012-09-08 LAB — URINALYSIS, COMPLETE
Bacteria: NONE SEEN
Bilirubin,UR: NEGATIVE
Ketone: NEGATIVE
Nitrite: NEGATIVE
Protein: NEGATIVE
Specific Gravity: 1.013 (ref 1.003–1.030)
Squamous Epithelial: <1

## 2012-09-08 LAB — CLOSTRIDIUM DIFFICILE BY PCR

## 2012-09-08 LAB — LIPASE, BLOOD: Lipase: 193 U/L (ref 73–393)

## 2013-05-22 ENCOUNTER — Encounter: Payer: Self-pay | Admitting: Internal Medicine

## 2013-06-19 ENCOUNTER — Ambulatory Visit: Payer: Medicare PPO | Admitting: Internal Medicine

## 2013-07-19 ENCOUNTER — Encounter: Payer: Self-pay | Admitting: *Deleted

## 2013-07-21 ENCOUNTER — Ambulatory Visit (INDEPENDENT_AMBULATORY_CARE_PROVIDER_SITE_OTHER): Payer: Medicare Other | Admitting: Internal Medicine

## 2013-07-21 ENCOUNTER — Encounter: Payer: Self-pay | Admitting: Internal Medicine

## 2013-07-21 VITALS — BP 165/71 | HR 54 | Ht 63.0 in | Wt 128.0 lb

## 2013-07-21 DIAGNOSIS — R002 Palpitations: Secondary | ICD-10-CM

## 2013-07-21 LAB — BASIC METABOLIC PANEL
BUN: 7 mg/dL (ref 6–23)
Creatinine, Ser: 0.8 mg/dL (ref 0.4–1.2)
GFR: 77.28 mL/min (ref >60.00)
Potassium: 3.9 mEq/L (ref 3.5–5.1)

## 2013-07-21 LAB — LIPID PANEL
Cholesterol: 199 mg/dL (ref 0–200)
VLDL: 20.8 mg/dL (ref 0.0–40.0)

## 2013-07-21 MED ORDER — LISINOPRIL-HYDROCHLOROTHIAZIDE 20-12.5 MG PO TABS: 0.5000 | ORAL_TABLET | Freq: Every day | ORAL | Status: DC

## 2013-07-21 NOTE — Patient Instructions (Addendum)
Your physician wants you to follow-up in: 1 YEAR WITH DR. Tenny Craw. You will receive a reminder letter in the mail two months in advance. If you don't receive a letter, please call our office to schedule the follow-up appointment.   LABS TODAY; BMET, LIPIDS ( FASTING)  DECREASE LISINOPRIL TO 1/2 TABLET DAILY

## 2013-07-21 NOTE — Progress Notes (Signed)
HPIHPI  Patient is a 72 year old who is referred for evaluation of palpitations. Seen by Laurell Josephs.  Has been on Tambocor rx'd by R. Alanda Amass.  Last spell of palpitations in 1996. Occasaional beats a little fast if drinks caffeine. Tries to avoid. Last spell of it beating a little fast was over 1 year ago. Shortlived.  No chest pains. Breathing ok. Active.  Last seen in 2012  Myoview was done  Normal  Since seen She has done well  No palpitations  No SOB NO CP Occasional diarrhea She is still smoking  Knows she has to quit.    She takes BP at home  90s to low 100s systolic  She feels bad.     Allergies  Allergen Reactions  . Clarithromycin   . Darvocet [Propoxyphene-Acetaminophen]   . Dobutamine Other (See Comments)    Heart beating hard with CP, neck pain, and weakness  . Doxycycline   . Epinephrine   . Penicillins   . Prednisone     Current Outpatient Prescriptions  Medication Sig Dispense Refill  . ALPRAZolam (XANAX) 0.5 MG tablet Take 0.5 mg by mouth 2 (two) times daily.       . butalbital-acetaminophen-caffeine (FIORICET WITH CODEINE) 50-325-40-30 MG per capsule Take 1 capsule by mouth every 4 (four) hours as needed.        . fish oil-omega-3 fatty acids 1000 MG capsule Take 2 g by mouth daily.        . flecainide (TAMBOCOR) 100 MG tablet Take 1 tablet (100 mg total) by mouth 2 (two) times daily.  60 tablet  6  . FLUoxetine (PROZAC) 40 MG capsule Take 40 mg by mouth daily.        . metoprolol (TOPROL-XL) 50 MG 24 hr tablet Take 50 mg by mouth daily.        Marland Kitchen omeprazole (PRILOSEC) 40 MG capsule Take 40 mg by mouth daily.        . vitamin B-12 (CYANOCOBALAMIN) 500 MCG tablet Take 500 mcg by mouth daily.      Marland Kitchen lisinopril-hydrochlorothiazide (PRINZIDE,ZESTORETIC) 20-12.5 MG per tablet Take 1 tablet by mouth daily.         No current facility-administered medications for this visit.    Past Medical History  Diagnosis Date  . Arrhythmia     palps  . Fatigue   .  Dizziness   . Hypertension   . SVT (supraventricular tachycardia)   . Mitral valve prolapse   . Headache(784.0)   . UTI (lower urinary tract infection)   . Anxiety   . COPD (chronic obstructive pulmonary disease)   . Radiculopathy   . H/O: hysterectomy     Past Surgical History  Procedure Laterality Date  . Cardiac catheterization  12/95  . Rotator cuff repair    . Appendectomy    . Cholecystectomy      Family History  Problem Relation Age of Onset  . Heart attack Other   . Asthma Other   . Heart failure Other   . Osteoporosis Other   . Heart attack Mother   . Heart Problems Brother     History   Social History  . Marital Status: Divorced    Spouse Name: N/A    Number of Children: N/A  . Years of Education: N/A   Occupational History  . Not on file.   Social History Main Topics  . Smoking status: Current Every Day Smoker  . Smokeless tobacco: Not on file  .  Alcohol Use:   . Drug Use:   . Sexual Activity:    Other Topics Concern  . Not on file   Social History Narrative  . No narrative on file    Review of Systems:  All systems reviewed.  They are negative to the above problem except as previously stated.  Vital Signs: BP 165/71  Pulse 54  Ht 5\' 3"  (1.6 m)  Wt 128 lb (58.06 kg)  BMI 22.68 kg/m2  Physical Exam Patient is in NAD HEENT:  Normocephalic, atraumatic. EOMI, PERRLA.  Neck: JVP is normal.  No bruits.  Lungs: clear to auscultation. No rales no wheezes.  Heart: Regular rate and rhythm. Normal S1, S2. No S3.   No significant murmurs. PMI not displaced.  Abdomen:  Supple, nontender. Normal bowel sounds. No masses. No hepatomegaly.  Extremities:   Good distal pulses throughout. No lower extremity edema.  Musculoskeletal :moving all extremities.  Neuro:   alert and oriented x3.  CN II-XII grossly intact.  EKG  SB 54 bpm.  First degree AV block 260 msec.  QTc 491 Assessment and Plan:  1.  Palpitations  Keep on current regimen for now  WIll  review with EP  2.  HTN  BP is increased  But it is low at home  Needs to have cuff calibrated  Since she is so symptomatic  I would recomm cutting lisinorpril in 1/2 to 5  Follow  3.  HL  Check lipids.   F

## 2013-07-28 ENCOUNTER — Telehealth: Payer: Self-pay | Admitting: Internal Medicine

## 2013-07-28 NOTE — Telephone Encounter (Signed)
New message ° ° ° ° °Returned a nurses call °

## 2013-07-28 NOTE — Telephone Encounter (Signed)
Spoke with pt, aware of lab results. 

## 2013-10-22 ENCOUNTER — Ambulatory Visit: Payer: Self-pay | Admitting: Internal Medicine

## 2013-10-22 ENCOUNTER — Observation Stay: Payer: Self-pay | Admitting: Student

## 2013-10-22 LAB — BASIC METABOLIC PANEL
Anion Gap: 4 — ABNORMAL LOW (ref 7–16)
BUN: 8 mg/dL (ref 7–18)
CHLORIDE: 104 mmol/L (ref 98–107)
Calcium, Total: 8.2 mg/dL — ABNORMAL LOW (ref 8.5–10.1)
Co2: 30 mmol/L (ref 21–32)
Creatinine: 0.75 mg/dL (ref 0.60–1.30)
GLUCOSE: 90 mg/dL (ref 65–99)
Osmolality: 274 (ref 275–301)
Potassium: 3.1 mmol/L — ABNORMAL LOW (ref 3.5–5.1)
SODIUM: 138 mmol/L (ref 136–145)

## 2013-10-22 LAB — CBC
HCT: 35.5 % (ref 35.0–47.0)
HGB: 11.7 g/dL — ABNORMAL LOW (ref 12.0–16.0)
MCH: 30.4 pg (ref 26.0–34.0)
MCHC: 33.1 g/dL (ref 32.0–36.0)
MCV: 92 fL (ref 80–100)
Platelet: 279 10*3/uL (ref 150–440)
RBC: 3.87 10*6/uL (ref 3.80–5.20)
RDW: 13.9 % (ref 11.5–14.5)
WBC: 7.8 10*3/uL (ref 3.6–11.0)

## 2013-10-22 LAB — CK-MB
CK-MB: 1.1 ng/mL (ref 0.5–3.6)
CK-MB: 0.9 ng/mL (ref 0.5–3.6)
CK-MB: 0.9 ng/mL (ref 0.5–3.6)

## 2013-10-22 LAB — TROPONIN I
TROPONIN-I: 0.04 ng/mL
Troponin-I: 0.04 ng/mL
Troponin-I: 0.05 ng/mL

## 2013-10-22 LAB — HEPATIC FUNCTION PANEL A (ARMC)
ALBUMIN: 3.1 g/dL — AB (ref 3.4–5.0)
ALK PHOS: 85 U/L
BILIRUBIN TOTAL: 0.1 mg/dL — AB (ref 0.2–1.0)
Bilirubin, Direct: <0.1 mg/dL (ref 0.00–0.20)
SGOT(AST): 22 U/L (ref 15–37)
SGPT (ALT): 15 U/L (ref 12–78)
Total Protein: 7.2 g/dL (ref 6.4–8.2)

## 2013-10-22 LAB — LIPASE, BLOOD: Lipase: 210 U/L (ref 73–393)

## 2013-10-22 LAB — PROTIME-INR
INR: 1.1
Prothrombin Time: 13.7 secs (ref 11.5–14.7)

## 2013-10-23 LAB — MAGNESIUM: MAGNESIUM: 1.7 mg/dL — AB

## 2013-10-23 LAB — CBC WITH DIFFERENTIAL/PLATELET
BASOS ABS: 0.1 10*3/uL (ref 0.0–0.1)
Basophil %: 1.4 %
EOS ABS: 0.3 10*3/uL (ref 0.0–0.7)
Eosinophil %: 4.1 %
HCT: 35 % (ref 35.0–47.0)
HGB: 11.7 g/dL — ABNORMAL LOW (ref 12.0–16.0)
Lymphocyte #: 2.4 10*3/uL (ref 1.0–3.6)
Lymphocyte %: 33.9 %
MCH: 30.9 pg (ref 26.0–34.0)
MCHC: 33.5 g/dL (ref 32.0–36.0)
MCV: 92 fL (ref 80–100)
Monocyte #: 0.7 x10 3/mm (ref 0.2–0.9)
Monocyte %: 9.2 %
NEUTROS ABS: 3.7 10*3/uL (ref 1.4–6.5)
NEUTROS PCT: 51.4 %
PLATELETS: 284 10*3/uL (ref 150–440)
RBC: 3.79 10*6/uL — ABNORMAL LOW (ref 3.80–5.20)
RDW: 14 % (ref 11.5–14.5)
WBC: 7.1 10*3/uL (ref 3.6–11.0)

## 2013-10-23 LAB — BASIC METABOLIC PANEL
ANION GAP: 5 — AB (ref 7–16)
BUN: 6 mg/dL — ABNORMAL LOW (ref 7–18)
CO2: 28 mmol/L (ref 21–32)
Calcium, Total: 8.8 mg/dL (ref 8.5–10.1)
Chloride: 107 mmol/L (ref 98–107)
Creatinine: 0.82 mg/dL (ref 0.60–1.30)
EGFR (Non-African Amer.): >60
Glucose: 87 mg/dL (ref 65–99)
Osmolality: 276 (ref 275–301)
POTASSIUM: 4.6 mmol/L (ref 3.5–5.1)
Sodium: 140 mmol/L (ref 136–145)

## 2013-10-25 LAB — PATHOLOGY REPORT

## 2014-04-09 ENCOUNTER — Emergency Department (HOSPITAL_COMMUNITY)
Admission: EM | Admit: 2014-04-09 | Discharge: 2014-04-09 | Disposition: A | Discharge status: Home / Self Care | Payer: Medicare Other | Attending: Emergency Medicine | Admitting: Emergency Medicine

## 2014-04-09 ENCOUNTER — Encounter (HOSPITAL_COMMUNITY): Payer: Self-pay | Admitting: Emergency Medicine

## 2014-04-09 DIAGNOSIS — F411 Generalized anxiety disorder: Secondary | ICD-10-CM | POA: Insufficient documentation

## 2014-04-09 DIAGNOSIS — R42 Dizziness and giddiness: Secondary | ICD-10-CM | POA: Diagnosis present

## 2014-04-09 DIAGNOSIS — Z880 Allergy status to penicillin: Secondary | ICD-10-CM | POA: Diagnosis not present

## 2014-04-09 DIAGNOSIS — R197 Diarrhea, unspecified: Secondary | ICD-10-CM | POA: Insufficient documentation

## 2014-04-09 DIAGNOSIS — I951 Orthostatic hypotension: Secondary | ICD-10-CM | POA: Diagnosis not present

## 2014-04-09 DIAGNOSIS — Z9089 Acquired absence of other organs: Secondary | ICD-10-CM | POA: Diagnosis not present

## 2014-04-09 DIAGNOSIS — F17200 Nicotine dependence, unspecified, uncomplicated: Secondary | ICD-10-CM | POA: Insufficient documentation

## 2014-04-09 DIAGNOSIS — Z8739 Personal history of other diseases of the musculoskeletal system and connective tissue: Secondary | ICD-10-CM | POA: Insufficient documentation

## 2014-04-09 DIAGNOSIS — R55 Syncope and collapse: Secondary | ICD-10-CM | POA: Insufficient documentation

## 2014-04-09 DIAGNOSIS — I10 Essential (primary) hypertension: Secondary | ICD-10-CM | POA: Insufficient documentation

## 2014-04-09 DIAGNOSIS — E860 Dehydration: Secondary | ICD-10-CM

## 2014-04-09 DIAGNOSIS — Z87440 Personal history of urinary (tract) infections: Secondary | ICD-10-CM | POA: Diagnosis not present

## 2014-04-09 DIAGNOSIS — Z79899 Other long term (current) drug therapy: Secondary | ICD-10-CM | POA: Insufficient documentation

## 2014-04-09 DIAGNOSIS — Z90710 Acquired absence of both cervix and uterus: Secondary | ICD-10-CM | POA: Diagnosis not present

## 2014-04-09 DIAGNOSIS — J449 Chronic obstructive pulmonary disease, unspecified: Secondary | ICD-10-CM | POA: Insufficient documentation

## 2014-04-09 DIAGNOSIS — Z9889 Other specified postprocedural states: Secondary | ICD-10-CM | POA: Diagnosis not present

## 2014-04-09 DIAGNOSIS — R630 Anorexia: Secondary | ICD-10-CM | POA: Diagnosis not present

## 2014-04-09 DIAGNOSIS — J4489 Other specified chronic obstructive pulmonary disease: Secondary | ICD-10-CM | POA: Insufficient documentation

## 2014-04-09 LAB — URINALYSIS, ROUTINE W REFLEX MICROSCOPIC
Bilirubin Urine: NEGATIVE
Glucose, UA: NEGATIVE mg/dL
Ketones, ur: NEGATIVE mg/dL
Leukocytes, UA: NEGATIVE
NITRITE: NEGATIVE
PH: 5 (ref 5.0–8.0)
Protein, ur: NEGATIVE mg/dL
SPECIFIC GRAVITY, URINE: 1.01 (ref 1.005–1.030)
UROBILINOGEN UA: 0.2 mg/dL (ref 0.0–1.0)

## 2014-04-09 LAB — URINE MICROSCOPIC-ADD ON

## 2014-04-09 LAB — BASIC METABOLIC PANEL
Anion gap: 14 (ref 5–15)
BUN: 14 mg/dL (ref 6–23)
CALCIUM: 9.8 mg/dL (ref 8.4–10.5)
CO2: 23 mEq/L (ref 19–32)
Chloride: 100 mEq/L (ref 96–112)
Creatinine, Ser: 1.12 mg/dL — ABNORMAL HIGH (ref 0.50–1.10)
GFR calc Af Amer: 55 mL/min — ABNORMAL LOW (ref >90)
GFR calc non Af Amer: 48 mL/min — ABNORMAL LOW (ref >90)
GLUCOSE: 109 mg/dL — AB (ref 70–99)
POTASSIUM: 4 meq/L (ref 3.7–5.3)
Sodium: 137 mEq/L (ref 137–147)

## 2014-04-09 LAB — CBC
HEMATOCRIT: 37.9 % (ref 36.0–46.0)
Hemoglobin: 13.1 g/dL (ref 12.0–15.0)
MCH: 30.2 pg (ref 26.0–34.0)
MCHC: 34.6 g/dL (ref 30.0–36.0)
MCV: 87.3 fL (ref 78.0–100.0)
PLATELETS: 387 10*3/uL (ref 150–400)
RBC: 4.34 MIL/uL (ref 3.87–5.11)
RDW: 12.3 % (ref 11.5–15.5)
WBC: 13.1 10*3/uL — AB (ref 4.0–10.5)

## 2014-04-09 LAB — I-STAT TROPONIN, ED: TROPONIN I, POC: 0 ng/mL (ref 0.00–0.08)

## 2014-04-09 MED ORDER — SODIUM CHLORIDE 0.9 % IV BOLUS (SEPSIS)
500.0000 mL | Freq: Once | INTRAVENOUS | Status: AC
  Administered 2014-04-09: 500 mL via INTRAVENOUS

## 2014-04-09 MED ORDER — SODIUM CHLORIDE 0.9 % IV BOLUS (SEPSIS)
1000.0000 mL | Freq: Once | INTRAVENOUS | Status: AC
  Administered 2014-04-09: 1000 mL via INTRAVENOUS

## 2014-04-09 MED ORDER — SODIUM CHLORIDE 0.9 % IV BOLUS (SEPSIS)
700.0000 mL | Freq: Once | INTRAVENOUS | Status: AC
  Administered 2014-04-09: 700 mL via INTRAVENOUS

## 2014-04-09 MED ORDER — SODIUM CHLORIDE 0.9 % IV SOLN
INTRAVENOUS | Status: DC
  Administered 2014-04-09: 13:00:00 via INTRAVENOUS

## 2014-04-09 MED ORDER — METOPROLOL SUCCINATE ER 25 MG PO TB24: 25.0000 mg | ORAL_TABLET | Freq: Every day | ORAL | Status: DC

## 2014-04-09 NOTE — ED Notes (Signed)
Pt given turkey sandwich

## 2014-04-09 NOTE — Discharge Instructions (Signed)
Dr Anne FuSkains, the cardiologist on call for Dr Tenny Crawoss wants you to stop the lisinopril/hydrochlorothiazide pills and to decrease your metoprolol to 25 mg a day. He is going to get you an appointment to be seen in Dr Charlott Rakesoss's office this week. Drink plenty of fluids and try to eat a regular diet. Return to the ED if you feel worse again.

## 2014-04-09 NOTE — ED Provider Notes (Addendum)
CSN: [id number]     Arrival date & time 04/09/14  1119 History   First MD Initiated Contact with Patient 04/09/14 1127     Chief Complaint  Patient presents with  . Dizziness     (Consider location/radiation/quality/duration/timing/severity/associated sxs/prior Treatment) HPI Patient reports her blood pressure has been hard to control the past 2-3 weeks. She states it will be high and it will be low. She saw her PCP last week and her labs were normal. She reports this morning she started having diarrhea which is a problem she's had for years. She states she's had about 5 episodes. She denies nausea, vomiting, or abdominal pain. She has a cough from postnasal drip but denies fever. She denies chest pain but her husband said she seemed short of breath earlier today. She states they were bringing her brother to the ED today to be seen and they had to walk a long way from the parking lot to get to the emergency department and she felt like she was going to pass out. She got very diaphoretic and her husband states however her color did not change. When she arrived in triage her blood pressure was 79/50. She states she felt like she was going to pass out. She states last night her blood pressure was 177/75. She normally takes her metoprolol at bedtime. However the past week her blood pressure has been low at night and she has not taken her lisinopril/hydrochlorothiazide until last night. She did not check her blood pressure this morning. She also reports decreased appetite for the past 3-4 weeks and then she has lost about 3 pounds.  PCP Dr Creta Levin at [facility] in [place] Cardiologist Dr Tenny Craw  Past Medical History  Diagnosis Date  . Arrhythmia     palps  . Fatigue   . Dizziness   . Hypertension   . SVT (supraventricular tachycardia)   . Mitral valve prolapse   . Headache(784.0)   . UTI (lower urinary tract infection)   . Anxiety   . COPD (chronic obstructive pulmonary disease)   .  Radiculopathy   . H/O: hysterectomy    Past Surgical History  Procedure Laterality Date  . Cardiac catheterization  12/95  . Rotator cuff repair    . Appendectomy    . Cholecystectomy     Family History  Problem Relation Age of Onset  . Heart attack Other   . Asthma Other   . Heart failure Other   . Osteoporosis Other   . Heart attack Mother   . Heart Problems Brother    History  Substance Use Topics  . Smoking status: Current Every Day Smoker  . Smokeless tobacco: Not on file  . Alcohol Use:    Lives at home Lives with spouse Smokes 1/2 - 1 ppd Denies ETOH  OB History   Grav Para Term Preterm Abortions TAB SAB Ect Mult Living                 Review of Systems  All other systems reviewed and are negative.     Allergies  Dobutamine; Epinephrine; Darvocet; Excedrin extra strength; Clarithromycin; Doxycycline; Penicillins; and Prednisone  Home Medications   Prior to Admission medications   Medication Sig Start Date End Date Taking? Authorizing Provider  ALPRAZolam [name]) 0.5 MG tablet Take 0.5 mg by mouth 3 (three) times daily.    Yes Historical Provider, MD  butalbital-acetaminophen-caffeine (FIORICET WITH CODEINE) 50-325-40-30 MG per capsule Take 1 capsule by mouth every 4 (four) hours  as needed for headache or migraine.    Yes Historical Provider, MD  cholecalciferol (VITAMIN D) 1000 UNITS tablet Take 1,000 Units by mouth daily.   Yes Historical Provider, MD  fexofenadine-pseudoephedrine (ALLEGRA-D) 60-120 MG per tablet Take 1 tablet by mouth daily as needed (for allergies).   Yes Historical Provider, MD  fish oil-omega-3 fatty acids 1000 MG capsule Take 1 g by mouth daily.    Yes Historical Provider, MD  flecainide (TAMBOCOR) 100 MG tablet Take 1 tablet (100 mg total) by mouth 2 (two) times daily. 04/20/11 04/09/14 Yes Pricilla Riffle, MD  FLUoxetine (PROZAC) 40 MG capsule Take 40 mg by mouth daily.     Yes Historical Provider, MD  lisinopril-hydrochlorothiazide  (PRINZIDE,ZESTORETIC) 10-12.5 MG per tablet Take 1 tablet by mouth daily.   Yes Historical Provider, MD  meclizine (ANTIVERT) 25 MG tablet Take 25 mg by mouth 3 (three) times daily. 04/04/14  Yes Historical Provider, MD  metoprolol (TOPROL-XL) 50 MG 24 hr tablet Take 50 mg by mouth daily.     Yes Historical Provider, MD  Multiple Vitamins-Minerals (MULTIVITAMIN PO) Take 1 tablet by mouth daily.   Yes Historical Provider, MD  omeprazole (PRILOSEC) 40 MG capsule Take 40 mg by mouth daily.     Yes Historical Provider, MD   ED Triage Vitals  Enc Vitals Group     BP 04/09/14 1124 79/50 mmHg     Pulse Rate 04/09/14 1124 81     Resp 04/09/14 1124 18     Temp 04/09/14 1124 98.1 F (36.7 C)     Temp src 04/09/14 1124 Oral     SpO2 04/09/14 1124 94 %     Weight 04/09/14 1124 126 lb (57.153 kg)     Height 04/09/14 1124 5\' 3"  (1.6 m)     Head Cir --      Peak Flow --      Pain Score --      Pain Loc --      Pain Edu? --      Excl. in GC? --      Vital signs normal except hypotension    12:17 Orthostatic Vital Signs   Orthostatic Lying - BP- Lying: 112/43 mmHg ; Pulse- Lying: 68  Orthostatic Sitting - BP- Sitting: 79/46 mmHg ; Pulse- Sitting: 62  Orthostatic Standing at 0 minutes - BP- Standing at 0 minutes: 64/39 mmHg (Patient started feeling very light headed while standing , she asked could she sit , patient was assisted back to sitting postion. Dr. Lynelle Doctor was informed.) ; Pulse- Standing at 0 minutes: 67      Physical Exam  Nursing note and vitals reviewed. Constitutional: She is oriented to person, place, and time.  Non-toxic appearance. She does not appear ill. No distress.  Frail elderly female, alert and cooperative, sitting up on her stretcher  HENT:  Head: Normocephalic and atraumatic.  Right Ear: External ear normal.  Left Ear: External ear normal.  Nose: Nose normal. No mucosal edema or rhinorrhea.  Mouth/Throat: Oropharynx is clear and moist and mucous membranes are  normal. No dental abscesses or uvula swelling.  Eyes: Conjunctivae and EOM are normal. Pupils are equal, round, and reactive to light.  Neck: Normal range of motion and full passive range of motion without pain. Neck supple.  Cardiovascular: Normal rate, regular rhythm and normal heart sounds.  Exam reveals no gallop and no friction rub.   No murmur heard. Pulmonary/Chest: Effort normal and breath sounds normal. No respiratory distress.  She has no wheezes. She has no rhonchi. She has no rales. She exhibits no tenderness and no crepitus.  Abdominal: Soft. Normal appearance and bowel sounds are normal. She exhibits no distension. There is no tenderness. There is no rebound and no guarding.  Musculoskeletal: Normal range of motion. She exhibits no edema and no tenderness.  Moves all extremities well.   Neurological: She is alert and oriented to person, place, and time. She has normal strength. No cranial nerve deficit.  Skin: Skin is warm, dry and intact. No rash noted. No erythema. No pallor.  Psychiatric: She has a normal mood and affect. Her speech is normal and behavior is normal. Her mood appears not anxious.    ED Course  Procedures (including critical care time)  Medications  0.9 %  sodium chloride infusion ( Intravenous Stopped 04/09/14 1621)  sodium chloride 0.9 % bolus 700 mL (0 mLs Intravenous Stopped 04/09/14 1325)  sodium chloride 0.9 % bolus 1,000 mL (0 mLs Intravenous Stopped 04/09/14 1621)  sodium chloride 0.9 % bolus 500 mL (500 mLs Intravenous Restarted 04/09/14 1621)   .  14:22 Orthostatic Vital Signs after first liter of NS Orthostatic Lying - BP- Lying: 137/53 mmHg ; Pulse- Lying: 69  Orthostatic Sitting - BP- Sitting: 119/64 mmHg ; Pulse- Sitting: 72  Orthostatic Standing at 0 minutes - BP- Standing at 0 minutes: 92/62 mmHg ; Pulse- Standing at 0 minutes: 77 Improved orthostasis after initial fluids. Pt feeling better, no urine output since she got the fluids.    12:35  Rosann Auerbach will have Dr Anne Fu call me back  14:43 Dr Anne Fu, suggests stopping the lisinopril/HCTZ, decrease her metoprolol to 25 mg a day and he will have her seen in the office soon.   15:58 Orthostatic Vital Signs JP Orthostatic Lying - BP- Lying: 129/52 mmHg ; Pulse- Lying: 70  Orthostatic Sitting - BP- Sitting: 129/56 mmHg ; Pulse- Sitting: 73  Orthostatic Standing at 0 minutes - BP- Standing at 0 minutes: 98/55 mmHg ; Pulse- Standing at 0 minutes: 75 Pt feeling less dizzy on standing, just starting to feel need to have UO. Will given 500 cc more bolus (has had almost 2000 cc).    Labs Review Results for orders placed during the hospital encounter of 04/09/14  CBC      Result Value Ref Range   WBC 13.1 (*) 4.0 - 10.5 K/uL   RBC 4.34  3.87 - 5.11 MIL/uL   Hemoglobin 13.1  12.0 - 15.0 g/dL   HCT 96.0  45.4 - 09.8 %   MCV 87.3  78.0 - 100.0 fL   MCH 30.2  26.0 - 34.0 pg   MCHC 34.6  30.0 - 36.0 g/dL   RDW 11.9  14.7 - 82.9 %   Platelets 387  150 - 400 K/uL  BASIC METABOLIC PANEL      Result Value Ref Range   Sodium 137  137 - 147 mEq/L   Potassium 4.0  3.7 - 5.3 mEq/L   Chloride 100  96 - 112 mEq/L   CO2 23  19 - 32 mEq/L   Glucose, Bld 109 (*) 70 - 99 mg/dL   BUN 14  6 - 23 mg/dL   Creatinine, Ser 5.62 (*) 0.50 - 1.10 mg/dL   Calcium 9.8  8.4 - 13.0 mg/dL   GFR calc non Af Amer 48 (*) >90 mL/min   GFR calc Af Amer 55 (*) >90 mL/min   Anion gap 14  5 - 15  URINALYSIS, ROUTINE W REFLEX MICROSCOPIC      Result Value Ref Range   Color, Urine YELLOW  YELLOW   APPearance CLEAR  CLEAR   Specific Gravity, Urine 1.010  1.005 - 1.030   pH 5.0  5.0 - 8.0   Glucose, UA NEGATIVE  NEGATIVE mg/dL   Hgb urine dipstick SMALL (*) NEGATIVE   Bilirubin Urine NEGATIVE  NEGATIVE   Ketones, ur NEGATIVE  NEGATIVE mg/dL   Protein, ur NEGATIVE  NEGATIVE mg/dL   Urobilinogen, UA 0.2  0.0 - 1.0 mg/dL   Nitrite NEGATIVE  NEGATIVE   Leukocytes, UA NEGATIVE  NEGATIVE  URINE MICROSCOPIC-ADD ON       Result Value Ref Range   Squamous Epithelial / LPF FEW (*) RARE   WBC, UA 0-2  <3 WBC/hpf   RBC / HPF 0-2  <3 RBC/hpf   Casts GRANULAR CAST (*) NEGATIVE   Urine-Other MUCOUS PRESENT    I-STAT TROPOININ, ED      Result Value Ref Range   Troponin i, poc 0.00  0.00 - 0.08 ng/mL   Comment 3            Laboratory interpretation all normal except leuykocytosis     Imaging Review No results found.   EKG Interpretation   Date/Time:  Monday April 09 2014 11:28:22 EDT Ventricular Rate:  80 PR Interval:  236 QRS Duration: 96 QT Interval:  428 QTC Calculation: 493 R Axis:   -72 Text Interpretation:  Sinus rhythm with 1st degree A-V block Left axis  deviation Pulmonary disease pattern Septal infarct , age undetermined  Electrode noise No significant change since last tracing 25 Jul 2004  Confirmed by Marcine Gadway  MD-I, Markala Sitts ([id number]) on 04/09/2014 11:48:29 AM      MDM   patient presents with uncontrolled blood pressure stating it varies from being too high to being too low. She also has had loss of appetite the last 3 weeks and today had 5 episodes of diarrhea. She presented hypotensive but responded to IV fluids. Of note she did not increase her heart rate and she said up and dropped her blood pressure because of her beta blockers. She also probably had underlying dehydration that got worse from her diarrhea. Patient improved with IV fluids. She is being discharged. She was discussed with cardiology who made recommendations on her blood pressure medications. They're going to follow her in the office later this week.    Final diagnoses:  Orthostatic hypotension  Near syncope  Diarrhea  Dehydration  Loss of appetite    New Prescriptions   METOPROLOL SUCCINATE (TOPROL-XL) 25 MG 24 HR TABLET    Take 1 tablet (25 mg total) by mouth daily.    Plan discharge  Devoria AlbeIva Alayssa Flinchum, MD, FACEP   CRITICAL CARE Performed by: Devoria AlbeKNAPP,Nickholas Goldston L Total critical care time: 33 min Critical care time was exclusive  of separately billable procedures and treating other patients. Critical care was necessary to treat or prevent imminent or life-threatening deterioration. Critical care was time spent personally by me on the following activities: development of treatment plan with patient and/or surrogate as well as nursing, discussions with consultants, evaluation of patient's response to treatment, examination of patient, obtaining history from patient or surrogate, ordering and performing treatments and interventions, ordering and review of laboratory studies, ordering and review of radiographic studies, pulse oximetry and re-evaluation of patient's condition.   Ward GivensIva L Stillman Buenger, MD 04/09/14 1638  Ward GivensIva L Kiwanna Spraker, MD 04/09/14 [id number]

## 2014-04-09 NOTE — ED Notes (Signed)
Pt reports being "swimmy headed" and dizzy x 2 weeks. bp is 79/50 at triage. Denies any pain. ekg done at triage and airway intact.

## 2014-04-20 ENCOUNTER — Ambulatory Visit: Payer: Medicare Other | Admitting: Internal Medicine

## 2014-05-10 NOTE — Progress Notes (Signed)
This encounter was created in error - please disregard.

## 2014-05-11 ENCOUNTER — Encounter: Payer: Medicare Other | Admitting: Internal Medicine

## 2014-06-15 ENCOUNTER — Ambulatory Visit: Payer: Medicare Other | Admitting: Internal Medicine

## 2014-07-13 ENCOUNTER — Ambulatory Visit (INDEPENDENT_AMBULATORY_CARE_PROVIDER_SITE_OTHER): Payer: Medicare Other | Admitting: Internal Medicine

## 2014-07-13 ENCOUNTER — Ambulatory Visit (INDEPENDENT_AMBULATORY_CARE_PROVIDER_SITE_OTHER): Payer: Medicare Other

## 2014-07-13 ENCOUNTER — Encounter: Payer: Self-pay | Admitting: Internal Medicine

## 2014-07-13 VITALS — BP 134/73 | HR 55 | Ht 63.0 in | Wt 127.1 lb

## 2014-07-13 DIAGNOSIS — I059 Rheumatic mitral valve disease, unspecified: Secondary | ICD-10-CM

## 2014-07-13 DIAGNOSIS — Z23 Encounter for immunization: Secondary | ICD-10-CM

## 2014-07-13 NOTE — Progress Notes (Signed)
HPIHPI  Patient is a 73 yo with a history of palpitations.  Previously followed by Barbara Hill.  I saw her in clinic once Hx myoview 2012 which was normal  l She was seen in the ER in August with dizziness Had diarrhea at time  Was orthostatic  Treated with IV fluids Since then she has felt very dizzy at times  Has to lean on things to keep up at times  She takes BP at home  90s to low 100s systolic  She feels bad.     Allergies  Allergen Reactions  . Dobutamine Other (See Comments)    Heart beating hard with CP, neck pain, and weakness  . Epinephrine Other (See Comments)    Fast heart beat  . Darvocet [Propoxyphene N-Acetaminophen] Nausea And Vomiting  . Excedrin Extra Strength [Aspirin-Acetaminophen-Caffeine] Nausea And Vomiting  . Clarithromycin Other (See Comments)    Yeast infection  . Doxycycline Other (See Comments)    Makes stomach hurt  . Penicillins Other (See Comments)    Yeast infection   . Prednisone Other (See Comments)    Shakes     Current Outpatient Prescriptions  Medication Sig Dispense Refill  . ALPRAZolam (XANAX) 0.5 MG tablet Take 0.5 mg by mouth 3 (three) times daily.     . butalbital-acetaminophen-caffeine (FIORICET, ESGIC) 50-325-40 MG per tablet   3  . cholecalciferol (VITAMIN D) 1000 UNITS tablet Take 1,000 Units by mouth daily.    . fexofenadine-pseudoephedrine (ALLEGRA-D) 60-120 MG per tablet Take 1 tablet by mouth daily as needed (for allergies).    . fish oil-omega-3 fatty acids 1000 MG capsule Take 1 g by mouth daily.     . flecainide (TAMBOCOR) 100 MG tablet Take 1 tablet (100 mg total) by mouth 2 (two) times daily. 60 tablet 6  . FLUoxetine (PROZAC) 40 MG capsule Take 40 mg by mouth daily.      Marland Kitchen. lisinopril-hydrochlorothiazide (PRINZIDE,ZESTORETIC) 10-12.5 MG per tablet Take 1 tablet by mouth daily.    . meclizine (ANTIVERT) 25 MG tablet Take 25 mg by mouth 3 (three) times daily.    . metoprolol (TOPROL-XL) 50 MG 24 hr tablet Take 50 mg by  mouth daily.      . Multiple Vitamins-Minerals (MULTIVITAMIN PO) Take 1 tablet by mouth daily.    Marland Kitchen. omeprazole (PRILOSEC) 40 MG capsule Take 40 mg by mouth daily.       No current facility-administered medications for this visit.    Past Medical History  Diagnosis Date  . Arrhythmia     palps  . Fatigue   . Dizziness   . Hypertension   . SVT (supraventricular tachycardia)   . Mitral valve prolapse   . Headache(784.0)   . UTI (lower urinary tract infection)   . Anxiety   . COPD (chronic obstructive pulmonary disease)   . Radiculopathy   . H/O: hysterectomy     Past Surgical History  Procedure Laterality Date  . Cardiac catheterization  12/95  . Rotator cuff repair    . Appendectomy    . Cholecystectomy      Family History  Problem Relation Age of Onset  . Heart attack Other   . Asthma Other   . Heart failure Other   . Osteoporosis Other   . Heart attack Mother   . Heart Problems Brother     History   Social History  . Marital Status: Divorced    Spouse Name: N/A    Number of Children: N/A  .  Years of Education: N/A   Occupational History  . Not on file.   Social History Main Topics  . Smoking status: Current Every Day Smoker  . Smokeless tobacco: Not on file  . Alcohol Use: Not on file  . Drug Use: Not on file  . Sexual Activity: Not on file   Other Topics Concern  . Not on file   Social History Narrative    Review of Systems:  All systems reviewed.  They are negative to the above problem except as previously stated.  Vital Signs: BP 140/80 mmHg  Pulse 61  Ht 5\' 3"  (1.6 m)  Wt 127 lb 1.9 oz (57.661 kg)  BMI 22.52 kg/m2  SpO2 99%  Physical Exam Patient is in NAD HEENT:  Normocephalic, atraumatic. EOMI, PERRLA.  Neck: JVP is normal.  No bruits.  Lungs: clear to auscultation. No rales no wheezes.  Heart: Regular rate and rhythm. Normal S1, S2. No S3.   No significant murmurs. PMI not displaced.  Abdomen:  Supple, nontender. Normal bowel  sounds. No masses. No hepatomegaly.  Extremities:   Good distal pulses throughout. No lower extremity edema.  Musculoskeletal :moving all extremities.  Neuro:   alert and oriented x3.  CN II-XII grossly intact.   Assessment and Plan:  1. Dizziness  Patients BP drops with standing  Would stop Prinizide and follow up in 2 wks  Continue other meds    2.  HTN   See above  3.  Palpitations.  Has been maintained on flecanide as initiated by [employer] [facility]  3.  HL  Need to follw  5.  Tob  Counselled on cessation    F

## 2014-07-13 NOTE — Patient Instructions (Addendum)
Your physician has recommended you make the following change in your medication: STOP Prinizide (Lisinopril HCT)  Your physician recommends that you schedule a follow-up appointment in: 2-3 WEEKS with Dr Tenny Crawoss for BP follow-up

## 2014-07-29 NOTE — Progress Notes (Signed)
HPIHPI  Patient is a 73 yo with a history of palpitations.  Previously followed by Jonette Eva [facility].  I saw her in clinic once Hx myoview 2012 which was normal  l She was seen in the ER in August with dizziness Had diarrhea at time  Was orthostatic  Treated with IV fluids Since then she has felt very dizzy at times  Has to lean on things to keep up at times  I saw there patinet on Nov 20 I cut back on her meds She still remains dizzy  No syncpe  Feels heart race  Weak    Allergies  Allergen Reactions  . Dobutamine Other (See Comments)    Heart beating hard with CP, neck pain, and weakness  . Epinephrine Other (See Comments)    Fast heart beat  . Darvocet [Propoxyphene N-Acetaminophen] Nausea And Vomiting  . Excedrin Extra Strength [Aspirin-Acetaminophen-Caffeine] Nausea And Vomiting  . Clarithromycin Other (See Comments)    Yeast infection  . Doxycycline Other (See Comments)    Makes stomach hurt  . Penicillins Other (See Comments)    Yeast infection   . Prednisone Other (See Comments)    Shakes     Current Outpatient Prescriptions  Medication Sig Dispense Refill  . ALPRAZolam (XANAX) 0.5 MG tablet Take 0.5 mg by mouth 3 (three) times daily.     . butalbital-acetaminophen-caffeine (FIORICET, ESGIC) 50-325-40 MG per tablet   3  . cholecalciferol (VITAMIN D) 1000 UNITS tablet Take 1,000 Units by mouth daily.    . fexofenadine-pseudoephedrine (ALLEGRA-D) 60-120 MG per tablet Take 1 tablet by mouth daily as needed (for allergies).    . fish oil-omega-3 fatty acids 1000 MG capsule Take 1 g by mouth daily.     Marland Kitchen. FLUoxetine (PROZAC) 40 MG capsule Take 40 mg by mouth daily.      . meclizine (ANTIVERT) 25 MG tablet Take 25 mg by mouth 3 (three) times daily.    . Multiple Vitamins-Minerals (MULTIVITAMIN PO) Take 1 tablet by mouth daily.    Marland Kitchen. omeprazole (PRILOSEC) 40 MG capsule Take 40 mg by mouth daily.      . flecainide (TAMBOCOR) 100 MG tablet Take 1 tablet (100 mg total) by mouth 2 (two)  times daily. 60 tablet 6  . metoprolol succinate (TOPROL XL) 25 MG 24 hr tablet Take 1 tablet (25 mg total) by mouth daily.    Marland Kitchen. pyridostigmine (MESTINON) 60 MG tablet Take 1 tablet (60 mg total) by mouth 2 (two) times daily. 60 tablet 6   No current facility-administered medications for this visit.    Past Medical History  Diagnosis Date  . Arrhythmia     palps  . Fatigue   . Dizziness   . Hypertension   . SVT (supraventricular tachycardia)   . Mitral valve prolapse   . Headache(784.0)   . UTI (lower urinary tract infection)   . Anxiety   . COPD (chronic obstructive pulmonary disease)   . Radiculopathy   . H/O: hysterectomy     Past Surgical History  Procedure Laterality Date  . Cardiac catheterization  12/95  . Rotator cuff repair    . Appendectomy    . Cholecystectomy      Family History  Problem Relation Age of Onset  . Heart attack Other   . Asthma Other   . Heart failure Other   . Osteoporosis Other   . Heart attack Mother   . Heart Problems Brother     History   Social History  .  Marital Status: Divorced    Spouse Name: N/A    Number of Children: N/A  . Years of Education: N/A   Occupational History  . Not on file.   Social History Main Topics  . Smoking status: Current Every Day Smoker  . Smokeless tobacco: Not on file  . Alcohol Use: Not on file  . Drug Use: Not on file  . Sexual Activity: Not on file   Other Topics Concern  . Not on file   Social History Narrative    Review of Systems:  All systems reviewed.  They are negative to the above problem except as previously stated.  Vital Signs: BP 150/84 mmHg  Pulse 61  Ht 5\' 3"  (1.6 m)  Wt 128 lb 9.6 oz (58.333 kg)  BMI 22.79 kg/m2 Orthostatics:  BP lying 150/84  P 61; Sitting:  120/80  P 66; Standing BP 86/58 P 68; Standing 3 min 90/58  P 54 I repeated with EKG machine  No ectopy  Pulse stayed in 60s   Physical Exam Patient is in NAD HEENT:  Normocephalic, atraumatic. EOMI,  PERRLA.  Neck: JVP is normal.  No bruits.  Lungs: clear to auscultation. No rales no wheezes.  Heart: Regular rate and rhythm. Normal S1, S2. No S3.   No significant murmurs. PMI not displaced.  Abdomen:  Supple, nontender. Normal bowel sounds. No masses. No hepatomegaly.  Extremities:   Good distal pulses throughout. No lower extremity edema.  Musculoskeletal :moving all extremities.  Neuro:   alert and oriented x3.  CN II-XII grossly intact.   Assessment and Plan:  1. Dizziness  Patient remains orthostatic  There is no pulse response  Consistent with autonomic failure   Reviewed with Odessa FlemingS Klein  Will Rx adominal binder and a trial of mestinon  Will [facility] to [facility]   2.  HTN   See above  3.  Palpitations.  Has been maintained on flecanide  No change for now.

## 2014-07-30 ENCOUNTER — Encounter: Payer: Self-pay | Admitting: Internal Medicine

## 2014-07-30 ENCOUNTER — Ambulatory Visit (INDEPENDENT_AMBULATORY_CARE_PROVIDER_SITE_OTHER): Payer: Medicare Other | Admitting: Internal Medicine

## 2014-07-30 VITALS — BP 150/84 | HR 61 | Ht 63.0 in | Wt 128.6 lb

## 2014-07-30 DIAGNOSIS — I951 Orthostatic hypotension: Secondary | ICD-10-CM

## 2014-07-30 MED ORDER — METOPROLOL SUCCINATE ER 25 MG PO TB24: 25.0000 mg | ORAL_TABLET | Freq: Every day | ORAL | Status: DC

## 2014-07-30 MED ORDER — PYRIDOSTIGMINE BROMIDE 60 MG PO TABS: 60.0000 mg | ORAL_TABLET | Freq: Two times a day (BID) | ORAL | Status: DC

## 2014-07-30 NOTE — Patient Instructions (Addendum)
Your physician has recommended you make the following change in your medication:  1.) mestinon 60 mg twice daily 2.) decrease Toprol XL to 25 mg  Your physician recommends that you return for lab work today (bmet, cortisol, cbc)

## 2014-07-31 LAB — CORTISOL: Cortisol, Plasma: 15.7 ug/dL

## 2014-07-31 LAB — BASIC METABOLIC PANEL
BUN: 13 mg/dL (ref 6–23)
CALCIUM: 9.4 mg/dL (ref 8.4–10.5)
CO2: 28 meq/L (ref 19–32)
Chloride: 95 mEq/L — ABNORMAL LOW (ref 96–112)
Creatinine, Ser: 1.1 mg/dL (ref 0.4–1.2)
GFR: 51.28 mL/min — ABNORMAL LOW (ref >60.00)
GLUCOSE: 93 mg/dL (ref 70–99)
Potassium: 4.9 mEq/L (ref 3.5–5.1)
Sodium: 131 mEq/L — ABNORMAL LOW (ref 135–145)

## 2014-07-31 LAB — CBC
HEMATOCRIT: 40.6 % (ref 36.0–46.0)
Hemoglobin: 13.4 g/dL (ref 12.0–15.0)
MCHC: 32.9 g/dL (ref 30.0–36.0)
MCV: 91.8 fl (ref 78.0–100.0)
Platelets: 356 10*3/uL (ref 150.0–400.0)
RBC: 4.43 Mil/uL (ref 3.87–5.11)
RDW: 12.6 % (ref 11.5–15.5)
WBC: 9.7 10*3/uL (ref 4.0–10.5)

## 2014-08-01 ENCOUNTER — Telehealth: Payer: Self-pay

## 2014-08-01 NOTE — Telephone Encounter (Signed)
Called patient to let her know her lab results. Per Dr. Tenny Crawoss, Cortisol normal CBC is normal, sodium is a little low. Make sure getting adequate salt. Patient had questions about her new medication Mestinon and was wondering about a future appointment. I told her that Mestinon was for peripheral nerve stimulation. Patient was concerned about her heart rate dropping too low, at present time her heart rate is in the 60's. Told patient I would refer her concerns about a future [facility].  Cindi CarbonPamela Pate RN

## 2014-08-01 NOTE — Telephone Encounter (Signed)
-----   Message from Dietrich PatesPaula Ross V, MD sent at 08/01/2014  8:34 AM EST ----- Cortisol normal  CBC is normal   Sodiume is a little low.  Make sure getting adequuate salt

## 2014-08-03 ENCOUNTER — Ambulatory Visit: Payer: Medicare Other | Admitting: Internal Medicine

## 2014-10-15 ENCOUNTER — Ambulatory Visit: Payer: Self-pay | Admitting: Internal Medicine

## 2014-10-22 ENCOUNTER — Ambulatory Visit (INDEPENDENT_AMBULATORY_CARE_PROVIDER_SITE_OTHER): Payer: Medicare HMO | Admitting: Internal Medicine

## 2014-10-22 ENCOUNTER — Encounter: Payer: Self-pay | Admitting: Internal Medicine

## 2014-10-22 VITALS — BP 114/78 | HR 63 | Ht 63.0 in | Wt 130.0 lb

## 2014-10-22 DIAGNOSIS — I10 Essential (primary) hypertension: Secondary | ICD-10-CM

## 2014-10-22 DIAGNOSIS — R42 Dizziness and giddiness: Secondary | ICD-10-CM

## 2014-10-22 MED ORDER — LISINOPRIL 5 MG PO TABS: 5.0000 mg | ORAL_TABLET | Freq: Every day | ORAL | Status: DC | PRN

## 2014-10-22 MED ORDER — PYRIDOSTIGMINE BROMIDE 60 MG PO TABS: 60.0000 mg | ORAL_TABLET | Freq: Two times a day (BID) | ORAL | Status: DC

## 2014-10-22 NOTE — Progress Notes (Signed)
Cardiology Office Note   Date:  10/22/2014   ID:  Barbara Hill, Barbara Hill 10-14-1940, MRN [id number]  PCP:  Lilia Argue  Cardiologist:   Dietrich Pates, MD   No chief complaint on file.     History of Present Illness: Barbara Hill is a 74 y.o. female with a history of palpitations   Previously followed by Barbara Hill. I saw her earlier this winter  [facility] also has a history of autonomic dysfunction.   When I saw her I recomm that she try mestinon  She did not know what this was for  Had it filled but did nto take it  Since seenshe has continued to have dizzy spells  No syncope  BP has been elevated at times  Husband has given her 1/2 of Lisnopril/HCTZ to take      Current Outpatient Prescriptions  Medication Sig Dispense Refill  . ALPRAZolam (XANAX) 0.5 MG tablet Take 0.5 mg by mouth 3 (three) times daily.     . butalbital-acetaminophen-caffeine (FIORICET, ESGIC) 50-325-40 MG per tablet   3  . cholecalciferol (VITAMIN D) 1000 UNITS tablet Take 1,000 Units by mouth daily.    . fexofenadine-pseudoephedrine (ALLEGRA-D) 60-120 MG per tablet Take 1 tablet by mouth daily as needed (for allergies).    . fish oil-omega-3 fatty acids 1000 MG capsule Take 1 g by mouth daily.     Marland Kitchen FLUoxetine (PROZAC) 40 MG capsule Take 40 mg by mouth daily.      . fluticasone (FLONASE) 50 MCG/ACT nasal spray   11  . meclizine (ANTIVERT) 25 MG tablet Take 25 mg by mouth 3 (three) times daily.    . metoprolol succinate (TOPROL XL) 25 MG 24 hr tablet Take 1 tablet (25 mg total) by mouth daily.    . Multiple Vitamins-Minerals (MULTIVITAMIN PO) Take 1 tablet by mouth daily.    Marland Kitchen omeprazole (PRILOSEC) 40 MG capsule Take 40 mg by mouth daily.      . flecainide (TAMBOCOR) 100 MG tablet Take 1 tablet (100 mg total) by mouth 2 (two) times daily. 60 tablet 6   No current facility-administered medications for this visit.    Allergies:   Dobutamine; Epinephrine; Darvocet; Excedrin extra strength;  Clarithromycin; Doxycycline; Penicillins; and Prednisone   Past Medical History  Diagnosis Date  . Arrhythmia     palps  . Fatigue   . Dizziness   . Hypertension   . SVT (supraventricular tachycardia)   . Mitral valve prolapse   . Headache(784.0)   . UTI (lower urinary tract infection)   . Anxiety   . COPD (chronic obstructive pulmonary disease)   . Radiculopathy   . H/O: hysterectomy     Past Surgical History  Procedure Laterality Date  . Cardiac catheterization  12/95  . Rotator cuff repair    . Appendectomy    . Cholecystectomy       Social History:  The patient  reports that she has been smoking.  She does not have any smokeless tobacco history on file.   Family History:  The patient's family history includes Asthma in her other; Heart Problems in her brother; Heart attack in her mother and other; Heart failure in her other; Osteoporosis in her other.    ROS:  Please see the history of present illness. All other systems are reviewed and  Negative to the above problem except as noted.    PHYSICAL EXAM: VS:  BP 114/78 mmHg  Pulse 63  Ht  (1.6  m)  Wt 130 lb (58.968 kg)  BMI 23.03 kg/m2   Orthostatics;  BP 155/78  P 56   ;  Sitting  122/71  P 59   Standing 98/72  P 62  3 min 87/54  P66 GEN: Thin 74 yo in no acute distress HEENT: normal Neck: no JVD, carotid bruits, or masses Cardiac: RRR; no murmurs, rubs, or gallops,no edema  Respiratory:  clear to auscultation bilaterally, normal work of breathing GI: soft, nontender, nondistended, + BS  No hepatomegaly  MS: no deformity Moving all extremities   Skin: warm and dry, no rash Neuro:  Strength and sensation are intact Psych: euthymic mood, full affect   EKG:  EKG is not ordered today.   Lipid Panel    Component Value Date/Time   CHOL 199 07/21/2013 1456   TRIG 104.0 07/21/2013 1456   HDL 63.60 07/21/2013 1456   CHOLHDL 3 07/21/2013 1456   VLDL 20.8 07/21/2013 1456   LDLCALC 115* 07/21/2013 1456       Wt Readings from Last 3 Encounters:  10/22/14 130 lb (58.968 kg)  07/30/14 128 lb 9.6 oz (58.333 kg)  07/13/14 127 lb 1.9 oz (57.661 kg)      ASSESSMENT AND PLAN:  1  Autonomic dysfunction  I would recomm that she try mestinon  Will call at end of wk  2.  HTN  Labile BP  Have given Rx for 5 mg lisinoprl prn  3.  Rhythm  [facility]  ( [facility])    Signed, Dietrich PatesPaula Gemayel Mascio, MD  10/22/2014 3:37 PM    [facility] [address], [place], [place]  [address] Phone: [phone]; Fax: [phone]

## 2014-10-22 NOTE — Patient Instructions (Signed)
Your physician has recommended you make the following change in your medication:  1.) lisinopril 5 mg --take one tablet daily as needed for blood pressure greater than 170/

## 2014-11-12 ENCOUNTER — Telehealth: Payer: Self-pay | Admitting: Internal Medicine

## 2014-11-12 NOTE — Telephone Encounter (Signed)
New Message        Pt calling stating that Dr. Tenny Crawoss was supposed to be finding pt another doctor for her bp issues. Pt states that she never heard anything from Dr. Tenny Crawoss after she saw her last month. Please call back and advise.

## 2014-11-14 NOTE — Telephone Encounter (Signed)
Follow up       Pt c/o BP issue: STAT if pt c/o blurred vision, one-sided weakness or slurred speech  1. What are your last 5 BP readings? 170/71 just now 2. Are you having any other symptoms (ex. Dizziness, headache, blurred vision, passed out)? no 3. What is your BP issue? bp is high.  Pt took a 5mg  lisinopril because her bp is high.  Please advise

## 2014-11-14 NOTE — Telephone Encounter (Signed)
Called patient got voicemail. It is after 5pm; pt cannot call back into the office until tomorrow.  Instructed on voicemail that I received her message and to relax/rest for rest of afternoon. Recheck BP around 8pm this evening. Call the office in the AM with reading, unless it  is higher than it is currently. In that case call the ON CALL provider for assistance tonight.  Forwarding to Dr. Tenny Crawoss to review.

## 2014-11-15 NOTE — Telephone Encounter (Signed)
Agree with recomm  Has lisinopril to take as needed  BP can be up and down

## 2014-11-20 NOTE — Telephone Encounter (Signed)
Patient woke this am with right eye bloodshot, thinks it was a vessel that broke. No vision changes. Had a headache this AM, "but headaches are nothing new to me"  9:30 --BP 190/77 took lisinopril 5 mg 11:30 -BP 182/ took 2nd dose of lisinopril and 1/2 xanax and 4 baby asa  Currently BP 170/71. Instructed pt to take her Toprol 25 mg (usually takes at bedtime)  Discussed with Dr. Eldridge DaceVaranasi (DOD)--no new orders.  States she could continue to take lisinopril in increments up to 3 xs daily as needed. Pt is aware I am forwarding to Dr. Tenny Crawoss for review.

## 2014-11-20 NOTE — Telephone Encounter (Signed)
° °  Pt c/o BP issue:  1. What are your last 5 BP readings? No readings  2. Are you having any other symptoms (ex. Dizziness, headache, blurred vision, passed out)? Pt states she woke up this morning and found that her eyes were filled with blood. She believes a vessedl behind her eye has busted.  3. What is your medication issue? No medication issue. Wants to know how to move forward.

## 2014-11-28 NOTE — Telephone Encounter (Signed)
Notified pt that Dr. Tenny Crawoss wants her to continue to take Lisinopril daily and to come in for BP check this week and bring her cuff with her.  She [facility] to eye Dr [facility].  States her (R) eye was bloodshot.  States the Dr. (can't remember her name) did xrays and put eye drops in her eye.  Told her that she had blood pooled in (R) eye and that a blood vessel had burst.  Did not give her any medications but told her that she would send Dr. Tenny Crawoss a copy of report and for her to get back in touch with Dr. Tenny Crawoss.  Set her up for a nurse visit 4/8 at 11:00 and for her to bring her BP cuff along with the BP readings. She verbalizes understanding and will come on Friday. Will forward to Dr. Tenny Crawoss for review.

## 2014-11-28 NOTE — Telephone Encounter (Signed)
Patient should take lisinopril daily Set up for BP check later this week or next Patient should bring cuff with her to confirm reliability

## 2014-12-03 NOTE — Telephone Encounter (Signed)
Patient cancelled nurse visit 4/8 and was rescheduled for today 4/11--cancelled--has the flu.  Currently rescheduled for BP check next Monday, 12/10/14.  Forwarding to Dr. Tenny Crawoss to inform.

## 2014-12-15 NOTE — Consult Note (Signed)
Pt seen and examined. Full consult to follow. Pt with epig pain on empty stomach, some odynophagia with food; no dysphagia;chronic heartburn; currently on nexium daily at home. Hx of ulcer disease in the past. Will plan EGD tomorrow. May check for H.pylori if ulcer present. Also, check for candidal esophagitis with hx of odynophagia. No oral thrush seen today. Will follow. Thanks.  Electronic Signatures: Lutricia Feilh, Anaeli Cornwall (MD)  (Signed on 01-Mar-15 10:44)  Authored  Last Updated: 01-Mar-15 10:44 by Lutricia Feilh, Jasiah Buntin (MD)

## 2014-12-15 NOTE — Discharge Summary (Signed)
PATIENT NAME:  Barbara Hill, Barbara Hill MR#:  [id number] DATE OF BIRTH:  09-15-1940  DATE OF ADMISSION:  10/22/2013 DATE OF DISCHARGE:  10/23/2013  CONSULTANTS: Dr. Bluford Kaufmannh from GI.   CHIEF COMPLAINT: Odynophagia, chest pain while swallowing.   PRIMARY CARE PHYSICIAN:  Gets followed up with [facility].   DISCHARGE DIAGNOSES: 1. Odynophagia with negative esophagogastroduodenoscopy, possible esophageal spasm.  2. History of chronic obstructive pulmonary disease.  3. Supraventricular ventricular tachycardia.  4. Hypertension.  5. Tobacco abuse.  6. History of anxiety.   DISCHARGE MEDICATIONS: Metoprolol succinate 50 mg once a day, Flecainide 100 mg every 12 hours, hydrochlorothiazide/lisinopril 25/20 mg 1 tab once a day, Xanax 0.5 mg 3 times a day as needed. Fioricet 1 tab 3 times a day as needed for headache, omeprazole 40 mg once a day,  tramadol 50 mg 1 to 2 tabs every six hours as needed, Flonase 50 mcg once a day.   DIET: Low sodium, low fat, low cholesterol.   ACTIVITY: As tolerated.   FOLLOWUP: Please follow with PCP and Dr. Bluford Kaufmannh within 1 to 2 weeks.   SIGNIFICANT LABS AND IMAGING: Troponins negative x 3 CK-MB negative x 3. Initial white count 7.8, hemoglobin 11.7. Platelets were 279. X-ray of the chest, one view, showing mild bibasilar atelectasis.  HISTORY OF PRESENT ILLNESS AND HOSPITAL COURSE: For full details of H and P, please see the dictation on 03/01 by Dr. Randol KernElgergawy, but briefly this is a 74 year old with chronic obstructive pulmonary disease, supraventricular tachycardia who comes in for odynophagia and chest pain while swallowing and some discomfort. She states that she has been having this worsening in the last week or so, but this has been ongoing for the past 6 to 8 months. She had negative troponins and was admitted to the hospitalist service. She was ruled out for acute coronary syndrome and GI was consulted. She was placed PPI for gastrointestinal prophylaxis and underwent  an EGD which per Dr. Bluford Kaufmannh was completely normal. Esophageal biopsies have been taken, and the patient might use Bentyl  as needed, if this is recurrent, but at this point, she has had no further episodes of this discomfort. It is possible that this is more of esophageal spasm or esophageal dysmotility and she should follow with GI for further work-up.   PHYSICAL EXAMINATION: VITAL SIGNS: On the day of discharge, temperature is 98.6, pulse rate 72, respiratory rate 18, blood pressure 132/72, oxygen saturation 94%.  GENERAL: The patient is a well-developed female lying in bed, in no obvious distress, talking in full sentences.  HEENT: Normocephalic, atraumatic. Moist mucous membranes.  NECK: Supple.  CARDIOVASCULAR: S1, S2 with no significant murmurs. LUNGS:  Clear without wheezing or rhonchi.  ABDOMEN: Soft, nontender.  EXTREMITIES: No pitting edema.  ____________________________ Krystal EatonShayiq Leone Putman, MD sa:sg D: 10/23/2013 16:41:00 ET T: 10/24/2013 10:27:24 ET JOB#: [id number]  cc: Serita ShellerErnest B. Maryellen PileEason, MD Krystal EatonShayiq Piera Downs, MD, <Dictator>   Krystal EatonSHAYIQ Tyjai Charbonnet MD ELECTRONICALLY SIGNED 10/27/2013 13:05

## 2014-12-15 NOTE — Consult Note (Signed)
PATIENT NAME:  Barbara Hill, Nayara M MR#:  [id number] DATE OF BIRTH:  10/24/1940  DATE OF CONSULTATION:  10/22/2013  REFERRING PHYSICIAN:   CONSULTING PHYSICIAN:  Ezzard StandingPaul Y. Bluford Kaufmannh, MD  REASON FOR REFERRAL: Odynophagia and chest pain.   DESCRIPTION: The patient is a 74 year old white female with a history of tobacco use and hypertension and COPD, who has been complaining of some chest pain with swallowing. She also has some epigastric pain on an empty stomach. She used to be on Prilosec daily but that was switched to Nexium daily, however, the patient still has symptoms as well. At times, the pain will go up to her throat from the epigastrium. There is no problem with dysphagia or heartburn symptoms. There is no cough and no shortness of breath. She does have a history reflux in the past. When she presented she took some baby aspirin at home, thinking this was cardiac, but the cardiac workup proved to be negative.   She recalls having a both upper and colonoscopy about 5 years ago. Upper endoscopy was normal then. Colonoscopy showed some diverticulosis.   PAST MEDICAL HISTORY: Notable for SVT, hypertension, COPD, anxiety.   PAST SURGICAL HISTORY:  Included a rotator cuff repair, appendectomy, cholecystectomy, cardiac catheterization and hysterectomy.   FAMILY HISTORY: Notable for coronary artery disease.   SOCIAL HISTORY: She still smokes 1/2 pack to 1 pack per day. There is no alcohol.   ALLERGIES: PREDNISONE AND EPINEPHRINE.   HOME MEDICATIONS: Include combination lisinopril/hydrochlorothiazide, alprazolam as needed, Fioricet, flecainide 100 mg twice a day, Prozac, metoprolol XL 50 mg daily, omeprazole 40 mg daily, B12 orally.   REVIEW OF SYSTEMS: There is no change from the initial review of symptoms that was dictated by the admitting doctor. Please refer to this.   PHYSICAL EXAMINATION: GENERAL: The patient is in no acute distress.  VITAL SIGNS: Stable.  HEENT: Normocephalic, atraumatic head.  Pupils are equally reactive. Throat was clear.  NECK: Supple.  CARDIAC: Regular rhythm and rate without murmurs.  LUNGS: Clear bilaterally.  ABDOMEN: Normoactive bowel sounds. There is a slight tenderness in the epigastrium. She has active bowel sounds. No hepatomegaly.  EXTREMITIES: No clubbing, cyanosis or edema.  NEUROLOGIC: Nonfocal.  SKIN: Negative.   LABORATORY, DIAGNOSTIC AND RADIOLOGICAL DATA: Hemoglobin 11.7. Potassium 3.1 sodium 138, glucose 90. INR is 1.1. Troponin level was 0.04. EKG was negative. Chest x-ray there was some atelectasis.   ASSESSMENT AND PLAN:  This is a patient with epigastric pain and some potential odynophagia. We need to think about reflux causes. Will plan on doing an upper endoscopy tomorrow. If there are ulcers present, then I may check for Helicobacter pylori  as well. Will also check for candidal esophagitis with her history of odynophagia and oral thrush in the past. I do not see any evidence of oral thrush today.   Thank you for the referral.   ____________________________ Ezzard StandingPaul Y. Bluford Kaufmannh, MD pyo:cs D: 10/23/2013 14:17:00 ET T: 10/23/2013 14:37:27 ET JOB#: [id number]  cc: Ezzard StandingPaul Y. Bluford Kaufmannh, MD, <Dictator> Ezzard StandingPAUL Y Berma Harts MD ELECTRONICALLY SIGNED 10/23/2013 19:05

## 2014-12-15 NOTE — Consult Note (Signed)
EGD completely normal. Esophageal bx taken to r/o esophagitis. Continue daily protonix. If sxs persist, consider bentyl prn since patient does have hx of anxiety. Will sign off. Pt can f/u with us in GI office if sxs persist. thanks.  Electronic Signatures: Lutricia Feilh, Prescilla Monger (MD)  (Signed on 02-Mar-15 14:19)  Authored  Last Updated: 02-Mar-15 14:19 by Lutricia Feilh, Janari Gagner (MD)

## 2014-12-15 NOTE — H&P (Signed)
PATIENT NAME:  Barbara Hill, Barbara Hill MR#:  [id number] DATE OF BIRTH:  [birth year]  DATE OF ADMISSION:  10/22/2013  REFERRING PHYSICIAN: Dr. Ina Kick.   PRIMARY CARE PHYSICIAN: Following at [facility].   PRIMARY CARDIOLOGIST: Dr. Tenny Craw at [facility].   CHIEF COMPLAINT: Odynophagia, chest pain while swallowing.   HISTORY OF PRESENT ILLNESS: This is a 74 year old female with past medical history of tobacco abuse, hypertension, SVT, COPD, who presents with complaint of chest pain during swallowing. The patient reports her symptoms started over the last week. Reports whenever she starts to swallow she has been having some chest discomfort and chest pain going all the way from throat all the way to her epigastric area. Reports it is mainly upon swallowing solids, but she denies any cough, any dysphagia or any aspiration symptoms. The patient took four baby aspirin at home, upon presentation there was a concern of acute coronary syndromes, so the patient was given subcutaneous Lovenox and had nitro paste started, which caused no change in her odynophagia. The patient's EKG did not have any changes from the last one compared with [facility] medical record. The patient reports that she had endoscopy and colonoscopy done before five years, reports endoscopy did not show any abnormalities. Colonoscopy only showed diverticulosis. Hospitalist service was requested to admit the patient for further evaluation.   PAST MEDICAL HISTORY: 1. Hypertension.  2. Supraventricular tachycardia.  3. Headache.  4. Chronic obstructive pulmonary disease.  5. Anxiety.   PAST SURGICAL HISTORY: 1. Cardiac catheter in 1995. 2. Rotator cuff repair.  3. Appendectomy.  4. Cholecystectomy.  5. Hysterectomy.   FAMILY HISTORY: Significant for coronary artery disease. Her father had an MI at the age of 54, otherwise, denies any coronary artery disease at young age in the family.   SOCIAL HISTORY: The patient smokes one half to  1 pack per day. No alcohol. No illicit drug use.   ALLERGIES:  1. EPINEPHRINE.  2. PREDNISONE.   HOME MEDICATIONS: 1. Lisinopril/hydrochlorothiazide 20/12.5, 1 tablet daily.  2. Alprazolam 0.5/2 times a day as needed.  3. Fioricet as needed.  4. Fish oil omega-3 fatty acid 1000 mg oral 2 grams daily. 5. Flecainide 100 mg oral 2 times a day. 6.  Prozac 40 mg oral daily.  7. Metoprolol XL 50 mg daily. 8. Omeprazole 40 mg daily.  9. B12 500 mcg oral daily.   REVIEW OF SYSTEMS:  CONSTITUTIONAL: Denies fever, chills, fatigue, weakness, weight gain, weight loss.   EYES: Denies blurry vision, double vision, inflammation, glaucoma.  ENT: Denies ear pain, hearing loss, epistaxis or discharge. RESPIRATORY: Denies cough, wheezing, hemoptysis, dyspnea.  CARDIOVASCULAR: Denies any edema, palpitations, syncope. Reports history of arrhythmia. Reports chest discomfort associated with swallowing.  GASTROINTESTINAL: Denies nausea, vomiting, diarrhea, abdominal pain, hematemesis, melena. Reports odynophagia.  GENITOURINARY: Denies dysuria, hematuria, renal colic.  ENDOCRINE: Denies polyuria, polydipsia, heat or cold intolerance.  HEMATOLOGY: Denies anemia, easy bruising, bleeding diathesis.  INTEGUMENTARY: Denies any acne, rash or skin lesion.  MUSCULOSKELETAL: Denies any joint effusion, erythema, any cramps, any gout.  NEUROLOGIC: Denies CVA, TIA, ataxia, vertigo, tremor.   PSYCHIATRIC: Denies insomnia. Reports history of anxiety and depression in the past.   PHYSICAL EXAMINATION: VITAL SIGNS: Pulse 51, respiratory rate 18, blood pressure 162/74, saturating 98% on room air.  GENERAL: Elderly female who looks comfortable in bed, in no apparent distress.  HEENT: Head atraumatic, normocephalic. Pupils equal, reactive to light. Pink conjunctivae. Anicteric sclerae. Moist oral mucosa.  NECK: Supple. No thyromegaly.  No JVD.  CHEST: Good air entry bilaterally. No wheezing, rales, rhonchi.   CARDIOVASCULAR: S1, S2 heard. No rubs, or gallops.  ABDOMEN: Soft, nontender, nondistended. Bowel sounds present.  EXTREMITIES: No edema. No clubbing. No cyanosis. Pedal pulses +2 bilaterally.  PSYCHIATRIC: Appropriate affect. Awake, alert x 3. Intact judgment and insight.  NEUROLOGIC: Cranial nerves grossly intact. Motor five out of five. No focal deficits.  SKIN: Normal skin turgor. Warm and dry.  MUSCULOSKELETAL: No joint effusion or erythema.   PERTINENT LABORATORIES: Glucose 90, BUN 8, creatinine 0.75 sodium 138, potassium 3.1, chloride 104, CO2 30, white blood cells 7.8, hemoglobin 11.7, hematocrit 35.5, platelets 279, INR 1.1. Troponin 0.04.   EKG showing normal sinus rhythm at 55 beats per minute. No significant changes from [facility].  Chest x-ray mild bibasilar atelectasis. Lungs otherwise clear.   ASSESSMENT AND PLAN: 1. Odynophagia. The patient presents with chest discomfort and tightness related to swallowing. This is more odynophagia more than cardiac chest pain. The patient will be kept on clear liquid diet. We will consult gastroenterology to see if she needs endoscopy given history of smoking, unclear if she has malignancy or not, out of concern of acute coronary syndrome  the patient was given one dose of Lovenox in the ED and she already took four baby aspirin at home. We will admit her to telemetry, continue to cycle her cardiac enzymes and follow the trend.  2. History of supraventricular tachycardia in the past. We will monitor the patient. We will continue her on flecainide and metoprolol. We will monitor on telemetry. We will correct her hypokalemia.  3. Hypokalemia. We will replace.  4. Hypertension: Blood pressure acceptable. Continue with home medicine.  5. Chronic obstructive pulmonary disease.  The patient has no wheezing at this point. We will have her on p.r.n. albuterol 6. Tobacco abuse: The patient was counseled.  7. Deep vein thrombosis  prophylaxis. The patient received one dose of Lovenox.. We will continue her on sequential compression device.  8. CODE STATUS: THE PATIENT REPORTS SHE IS A FULL CODE.   TOTAL TIME SPENT ON ADMISSION AND PATIENT CARE: 50 minutes.    ____________________________ Starleen Armsawood S. Lashe Oliveira, MD dse:sg D: 10/22/2013 04:24:06 ET T: 10/22/2013 06:53:58 ET JOB#: [id number]  cc: Starleen Armsawood S. Raiden Haydu, MD, <Dictator> Takiyah Bohnsack Teena IraniS Betsie Peckman MD ELECTRONICALLY SIGNED 10/24/2013 0:35

## 2014-12-22 ENCOUNTER — Telehealth: Payer: Self-pay | Admitting: Nurse Practitioner

## 2014-12-22 NOTE — Telephone Encounter (Signed)
   [name] called to report that her BP has been trending in the 180's and 190's this morning/early afternoon.  She has had labile bp in the past.  She took lisinopril 5 as usual this AM and then took another 5 mg around 10 AM when pressure was in 180's. She just repeated bp and it is now 197/74. She is asymptomatic but nervous and upset.  She has also taken a xanax.  I rec that she take another 5 mg of lisinopril now and repeat her BP later this afternoon.  Caller verbalized understanding and was grateful for the call back.  Barbara Duckinghristopher Jahking Lesser, NP 12/22/2014, 1:53 PM

## 2014-12-25 ENCOUNTER — Ambulatory Visit (INDEPENDENT_AMBULATORY_CARE_PROVIDER_SITE_OTHER): Payer: Medicare HMO | Admitting: *Deleted

## 2014-12-25 VITALS — Wt 130.8 lb

## 2014-12-25 DIAGNOSIS — Z0130 Encounter for examination of blood pressure without abnormal findings: Secondary | ICD-10-CM

## 2014-12-25 DIAGNOSIS — Z136 Encounter for screening for cardiovascular disorders: Secondary | ICD-10-CM | POA: Diagnosis not present

## 2014-12-25 NOTE — Progress Notes (Signed)
1.) Reason for visit: BP CHECK  2.) Name of MD requesting visit: Dr. Tenny Crawoss  3.) H&P: pt with labile blood pressures, was to bring home cuff today to correlate but pt forgot to bring.  4.) ROS related to problem: still having BPs elevated, but also BP drops with position changes and she is symptomatic.   Please review vitals for bp readings.  Pt was not laying during visit.--checked sitting, standing and standing at 3 min.  Please review phone note from 4/30.  Also review pt instructions for details.--pt has not started taking mestinon  5.) Assessment and Plan per MD: sent encounter to Dr. Tenny Crawoss for review.

## 2014-12-25 NOTE — Patient Instructions (Signed)
Check your blood pressure twice daily--in the morning and the evening Record your blood pressures. Use the lisinopril 5 mg as needed when your blood pressure is greater than 170. (as Dr. Tenny Craw had instructed previously) Record next to your blood pressure if you take lisinopril.  Continue all your other medications, including mestinon 60 mg twice daily, (as Dr. Tenny Craw had instructed previously)  As discussed, please take your blood pressure cuff to your doctor's office to compare readings as soon as possible to ensure your machine is accurate.  After you know your cuff is accurate, start recording the blood pressures.  Remember to change positions slowly and carefully because your blood pressure can drop with position changes.  Smoking Cessation Quitting smoking is important to your health and has many advantages. However, it is not always easy to quit since nicotine is a very addictive drug. Oftentimes, people try 3 times or more before being able to quit. This document explains the best ways for you to prepare to quit smoking. Quitting takes hard work and a lot of effort, but you can do it. ADVANTAGES OF QUITTING SMOKING  You will live longer, feel better, and live better.  Your body will feel the impact of quitting smoking almost immediately.  Within 20 minutes, blood pressure decreases. Your pulse returns to its normal level.  After 8 hours, carbon monoxide levels in the blood return to normal. Your oxygen level increases.  After 24 hours, the chance of having a heart attack starts to decrease. Your breath, hair, and body stop smelling like smoke.  After 48 hours, damaged nerve endings begin to recover. Your sense of taste and smell improve.  After 72 hours, the body is virtually free of nicotine. Your bronchial tubes relax and breathing becomes easier.  After 2 to 12 weeks, lungs can hold more air. Exercise becomes easier and circulation improves.  The risk of having a heart attack,  stroke, cancer, or lung disease is greatly reduced.  After 1 year, the risk of coronary heart disease is cut in half.  After 5 years, the risk of stroke falls to the same as a nonsmoker.  After 10 years, the risk of lung cancer is cut in half and the risk of other cancers decreases significantly.  After 15 years, the risk of coronary heart disease drops, usually to the level of a nonsmoker.  If you are pregnant, quitting smoking will improve your chances of having a healthy baby.  The people you live with, especially any children, will be healthier.  You will have extra money to spend on things other than cigarettes. QUESTIONS TO THINK ABOUT BEFORE ATTEMPTING TO QUIT You may want to talk about your answers with your health care provider.  Why do you want to quit?  If you tried to quit in the past, what helped and what did not?  What will be the most difficult situations for you after you quit? How will you plan to handle them?  Who can help you through the tough times? Your family? Friends? A health care provider?  What pleasures do you get from smoking? What ways can you still get pleasure if you quit? Here are some questions to ask your health care provider:  How can you help me to be successful at quitting?  What medicine do you think would be best for me and how should I take it?  What should I do if I need more help?  What is smoking withdrawal like? How can  I get information on withdrawal? GET READY  Set a quit date.  Change your environment by getting rid of all cigarettes, ashtrays, matches, and lighters in your home, car, or work. Do not let people smoke in your home.  Review your past attempts to quit. Think about what worked and what did not. GET SUPPORT AND ENCOURAGEMENT You have a better chance of being successful if you have help. You can get support in many ways.  Tell your family, friends, and coworkers that you are going to quit and need their support.  Ask them not to smoke around you.  Get individual, group, or telephone counseling and support. Programs are available at [facility] and health [facility]. Call your local health department for information about programs in your area.  Spiritual beliefs and practices may help some smokers quit.  Download a "quit meter" on your computer to keep track of quit statistics, such as how long you have gone without smoking, cigarettes not smoked, and money saved.  Get a self-help book about quitting smoking and staying off tobacco. LEARN NEW SKILLS AND BEHAVIORS  Distract yourself from urges to smoke. Talk to someone, go for a walk, or occupy your time with a task.  Change your normal routine. Take a different route to work. Drink tea instead of coffee. Eat breakfast in a different place.  Reduce your stress. Take a hot bath, exercise, or read a book.  Plan something enjoyable to do every day. Reward yourself for not smoking.  Explore interactive web-based programs that specialize in helping you quit. GET MEDICINE AND USE IT CORRECTLY Medicines can help you stop smoking and decrease the urge to smoke. Combining medicine with the above behavioral methods and support can greatly increase your chances of successfully quitting smoking.  Nicotine replacement therapy helps deliver nicotine to your body without the negative effects and risks of smoking. Nicotine replacement therapy includes nicotine gum, lozenges, inhalers, nasal sprays, and skin patches. Some may be available over-the-counter and others require a prescription.  Antidepressant medicine helps people abstain from smoking, but how this works is unknown. This medicine is available by prescription.  Nicotinic receptor partial agonist medicine simulates the effect of nicotine in your brain. This medicine is available by prescription. Ask your health care provider for advice about which medicines to use and how to use them based on your  health history. Your health care provider will tell you what side effects to look out for if you choose to be on a medicine or therapy. Carefully read the information on the package. Do not use any other product containing nicotine while using a nicotine replacement product.  RELAPSE OR DIFFICULT SITUATIONS Most relapses occur within the first 3 months after quitting. Do not be discouraged if you start smoking again. Remember, most people try several times before finally quitting. You may have symptoms of withdrawal because your body is used to nicotine. You may crave cigarettes, be irritable, feel very hungry, cough often, get headaches, or have difficulty concentrating. The withdrawal symptoms are only temporary. They are strongest when you first quit, but they will go away within 10-14 days. To reduce the chances of relapse, try to:  Avoid drinking alcohol. Drinking lowers your chances of successfully quitting.  Reduce the amount of caffeine you consume. Once you quit smoking, the amount of caffeine in your body increases and can give you symptoms, such as a rapid heartbeat, sweating, and anxiety.  Avoid smokers because they can make you want to  smoke.  Do not let weight gain distract you. Many smokers will gain weight when they quit, usually less than 10 pounds. Eat a healthy diet and stay active. You can always lose the weight gained after you quit.  Find ways to improve your mood other than smoking. FOR MORE INFORMATION  www.smokefree.gov  Document Released: 08/04/2001 Document Revised: 12/25/2013 Document Reviewed: 11/19/2011 [facility] Patient Information 2015 [place], [place]. This information is not intended to replace advice given to you by your health care provider. Make sure you discuss any questions you have with your health care provider.

## 2014-12-30 NOTE — Progress Notes (Signed)
Please get f/u appt for patient  Shee needs to bring BP cuff to clinic

## 2015-01-08 ENCOUNTER — Telehealth: Payer: Self-pay | Admitting: Internal Medicine

## 2015-01-08 NOTE — Telephone Encounter (Signed)
I spoke with the pt and made her aware that she needs to monitor her BP on a regular basis and keep a log of her readings for Dr Tenny Crawoss to review. The pt has a pending appointment on 01/24/15 with Dr Tenny Crawoss. I advised the pt to bring her BP cuff to appointment so that we can check it for accuracy. While speaking with the pt she states that she is frying chicken.  I made her aware that due to elevated BP she also needs decrease the salt in her diet.

## 2015-01-08 NOTE — Telephone Encounter (Signed)
New message      I called pt to schedule an bp appt for 6-2---[facility] [employer] [facility].  Pt want nurse to know tht her bp today was 196/??? Sitting and 145/70 standing.   She took two 5mg  lisinopril tablets and it was 150 something/????? She will keep her 6-2 appt and will bring her bp cuff with her

## 2015-01-17 ENCOUNTER — Telehealth: Payer: Self-pay | Admitting: Internal Medicine

## 2015-01-17 MED ORDER — LISINOPRIL 5 MG PO TABS: 5.0000 mg | ORAL_TABLET | Freq: Every day | ORAL | Status: DC

## 2015-01-17 NOTE — Telephone Encounter (Signed)
Patient reporting in that last night she had episode of high blood pressure that lasted throughout the evening. Readings as follows:  6:15p = 197/78  Took 1 tablet lisinopril  6:45p = 215/79  Took 2nd tablet lisinopril and 1 tablet xanax 6:55p = 203/85  Took 1 tablet ASA 7:20p = 191/86  Took 3rd tablet lisinopril 8:25p = 176/70  Tried to rest  This AM BP is currently 155/60. Patient states she felt anxious last night and almost called 911 because she felt "so bad and uneasy like I was going numb or something".  Provided education on appropriateness of calling 911 when she is experiencing cardiac symptoms, especially when also having elevated blood pressure readings. Reiterated that it is always better to call 911 and allow EMS to come out to check you out and help you decide whether you need to go to ED. Reviewed above information with Dr. Tenny Crawoss. Advisement rec'd for patient to maintain her prn doses of lisinopril, as is, but to add Lisinopril 5 mg daily to regimen. Patient to continue monitoring Blood Pressure and contact office on Monday to report how BP is with daily lisinopril. Patient is to keep June 2nd appt, as scheduled. Notified patient of all of above and she had me explain to her husband too so that they both understood directions on the Lisinopril and BP checks. Verbalized understanding and agreement. Patient will call back Monday with BP readings.

## 2015-01-17 NOTE — Telephone Encounter (Signed)
New message   Pt c/o BP issue: STAT if pt c/o blurred vision, one-sided weakness or slurred speech  1. What are your last 5 BP readings? 197/76, 215/79, 191/86, had taken lisinopril 176/70, 1 2. Are you having any other symptoms (ex. Dizziness, headache, blurred vision, passed out)? Felt like she was going to pass out and has had SOB , got numb all over.   3. What is your BP issue? High b/p

## 2015-01-18 ENCOUNTER — Emergency Department (HOSPITAL_COMMUNITY): Payer: Medicare HMO

## 2015-01-18 ENCOUNTER — Encounter (HOSPITAL_COMMUNITY): Payer: Self-pay | Admitting: *Deleted

## 2015-01-18 ENCOUNTER — Emergency Department (HOSPITAL_COMMUNITY)
Admission: EM | Admit: 2015-01-18 | Discharge: 2015-01-19 | Disposition: A | Discharge status: Home / Self Care | Payer: Medicare HMO | Attending: Emergency Medicine | Admitting: Emergency Medicine

## 2015-01-18 DIAGNOSIS — Z880 Allergy status to penicillin: Secondary | ICD-10-CM | POA: Insufficient documentation

## 2015-01-18 DIAGNOSIS — Z9889 Other specified postprocedural states: Secondary | ICD-10-CM | POA: Insufficient documentation

## 2015-01-18 DIAGNOSIS — Z79899 Other long term (current) drug therapy: Secondary | ICD-10-CM | POA: Diagnosis not present

## 2015-01-18 DIAGNOSIS — I471 Supraventricular tachycardia: Secondary | ICD-10-CM | POA: Diagnosis not present

## 2015-01-18 DIAGNOSIS — J449 Chronic obstructive pulmonary disease, unspecified: Secondary | ICD-10-CM | POA: Insufficient documentation

## 2015-01-18 DIAGNOSIS — I160 Hypertensive urgency: Secondary | ICD-10-CM

## 2015-01-18 DIAGNOSIS — Z87440 Personal history of urinary (tract) infections: Secondary | ICD-10-CM | POA: Diagnosis not present

## 2015-01-18 DIAGNOSIS — R110 Nausea: Secondary | ICD-10-CM | POA: Insufficient documentation

## 2015-01-18 DIAGNOSIS — Z720 Tobacco use: Secondary | ICD-10-CM | POA: Diagnosis not present

## 2015-01-18 DIAGNOSIS — I10 Essential (primary) hypertension: Secondary | ICD-10-CM | POA: Diagnosis not present

## 2015-01-18 DIAGNOSIS — R300 Dysuria: Secondary | ICD-10-CM | POA: Diagnosis not present

## 2015-01-18 DIAGNOSIS — F419 Anxiety disorder, unspecified: Secondary | ICD-10-CM | POA: Diagnosis not present

## 2015-01-18 LAB — URINALYSIS, ROUTINE W REFLEX MICROSCOPIC
BILIRUBIN URINE: NEGATIVE
GLUCOSE, UA: NEGATIVE mg/dL
Ketones, ur: NEGATIVE mg/dL
Leukocytes, UA: NEGATIVE
Nitrite: NEGATIVE
Protein, ur: NEGATIVE mg/dL
Specific Gravity, Urine: 1.004 — ABNORMAL LOW (ref 1.005–1.030)
Urobilinogen, UA: 0.2 mg/dL (ref 0.0–1.0)
pH: 6.5 (ref 5.0–8.0)

## 2015-01-18 LAB — CBC WITH DIFFERENTIAL/PLATELET
Basophils Absolute: 0 10*3/uL (ref 0.0–0.1)
Basophils Relative: 0 % (ref 0–1)
EOS ABS: 0.4 10*3/uL (ref 0.0–0.7)
Eosinophils Relative: 5 % (ref 0–5)
HCT: 35 % — ABNORMAL LOW (ref 36.0–46.0)
Hemoglobin: 11.3 g/dL — ABNORMAL LOW (ref 12.0–15.0)
LYMPHS ABS: 2.8 10*3/uL (ref 0.7–4.0)
Lymphocytes Relative: 34 % (ref 12–46)
MCH: 28.7 pg (ref 26.0–34.0)
MCHC: 32.3 g/dL (ref 30.0–36.0)
MCV: 88.8 fL (ref 78.0–100.0)
Monocytes Absolute: 0.9 10*3/uL (ref 0.1–1.0)
Monocytes Relative: 11 % (ref 3–12)
NEUTROS PCT: 50 % (ref 43–77)
Neutro Abs: 4.1 10*3/uL (ref 1.7–7.7)
PLATELETS: 326 10*3/uL (ref 150–400)
RBC: 3.94 MIL/uL (ref 3.87–5.11)
RDW: 13.2 % (ref 11.5–15.5)
WBC: 8.2 10*3/uL (ref 4.0–10.5)

## 2015-01-18 LAB — COMPREHENSIVE METABOLIC PANEL
ALBUMIN: 3.5 g/dL (ref 3.5–5.0)
ALT: 9 U/L — ABNORMAL LOW (ref 14–54)
ANION GAP: 11 (ref 5–15)
AST: 16 U/L (ref 15–41)
Alkaline Phosphatase: 73 U/L (ref 38–126)
BUN: 6 mg/dL (ref 6–20)
CO2: 28 mmol/L (ref 22–32)
Calcium: 9.3 mg/dL (ref 8.9–10.3)
Chloride: 101 mmol/L (ref 101–111)
Creatinine, Ser: 0.69 mg/dL (ref 0.44–1.00)
GFR calc Af Amer: >60 mL/min (ref >60)
GLUCOSE: 95 mg/dL (ref 65–99)
Potassium: 2.9 mmol/L — ABNORMAL LOW (ref 3.5–5.1)
Sodium: 140 mmol/L (ref 135–145)
Total Bilirubin: 0.7 mg/dL (ref 0.3–1.2)
Total Protein: 7.1 g/dL (ref 6.5–8.1)

## 2015-01-18 LAB — URINE MICROSCOPIC-ADD ON

## 2015-01-18 LAB — TROPONIN I: Troponin I: 0.03 ng/mL (ref <0.031)

## 2015-01-18 MED ORDER — METOPROLOL SUCCINATE ER 25 MG PO TB24
25.0000 mg | ORAL_TABLET | Freq: Every day | ORAL | Status: DC
  Administered 2015-01-18: 25 mg via ORAL
  Filled 2015-01-18: qty 1

## 2015-01-18 MED ORDER — FLECAINIDE ACETATE 100 MG PO TABS
100.0000 mg | ORAL_TABLET | Freq: Once | ORAL | Status: AC
  Administered 2015-01-18: 100 mg via ORAL
  Filled 2015-01-18: qty 1

## 2015-01-18 MED ORDER — POTASSIUM CHLORIDE CRYS ER 20 MEQ PO TBCR
40.0000 meq | EXTENDED_RELEASE_TABLET | Freq: Once | ORAL | Status: AC
  Administered 2015-01-18: 40 meq via ORAL
  Filled 2015-01-18: qty 2

## 2015-01-18 MED ORDER — CLONIDINE HCL 0.2 MG PO TABS
0.2000 mg | ORAL_TABLET | Freq: Once | ORAL | Status: AC
  Administered 2015-01-18: 0.2 mg via ORAL
  Filled 2015-01-18: qty 1

## 2015-01-18 NOTE — ED Notes (Signed)
Pt reports high blood pressure for a few days. Pt states she has intermittent full body numbness. Pt denies headache, seeing spots, blurry vision. Pt states she took extra blood pressure medication today. Pt denies any symptoms with her high blood pressure.

## 2015-01-18 NOTE — Telephone Encounter (Signed)
Called patient back. Patient complained of being extremely tired and not feeling well.  Informed patient that her BP is high and she needs to be evaluated at urgent care or ED. Informed patient that is she has any chest pain or signs and symptoms of a stroke to call 911. Informed patient of these signs and symptoms and what to watch for. Patient stated that she had some numbness in extremities yesterday and some this morning, but not at this present time. Patient seems to be worried about her BP and took 2 xanax already today. Patient also informed the office that she has taken up to at least three extra lisinopril 5 mg today. Patient agreed to plan and will head to urgent care or ED.

## 2015-01-18 NOTE — Telephone Encounter (Signed)
Pt c/o BP issue:  1. What are your last 5 BP readings? After taking Mg Lisinopril    @ 11am  133/61  @ 2:30pm 181/88  @ 3:10pm 195/81  @ 4P 179/79  @ 4:45 195/90  2. Are you having any other symptoms (ex. Dizziness, headache, blurred vision, passed out)? Feels Rotten. Just took another 5 mg of Lisinopril legs feel funny 3. What is your medication issue? Pt states that she was advised that if it got worse to call 911, however she doesn't feel as if it is that bad. Req a call back from the nurse to assist

## 2015-01-18 NOTE — ED Provider Notes (Signed)
CSN: [id number]     Arrival date & time 01/18/15  1850 History   First MD Initiated Contact with Patient 01/18/15 1900     Chief Complaint  Patient presents with  . Hypertension    (Consider location/radiation/quality/duration/timing/severity/associated sxs/prior Treatment) HPI Comments: Patient is a 74 year old female with a history of palpitations, hypertension, supraventricular tachycardia, mitral valve prolapse, anxiety, and COPD. She presents to the emergency department for further evaluation of high blood pressure. Patient states that she has had difficulty controlling her blood pressure since her regimen was changed in August 2015. She is a poor historian with regard to the number of changes her blood pressure regimen has undergone, but states that she currently takes 5 mg of Lisinopril QD, Flecainide BID, and 25 mg of Metoprolol QHS (prior to August, her Metoprolol dose was 50 mg daily and her Lisinopril dose was 20 mg QD). She has been noticing elevated blood pressure over the past 3 weeks. She states that she has had worsening nausea and lightheadedness as well. This has prompted her to take her blood pressure more frequently than normal. Patient also states, "I have the feeling of numbness in my whole body, but my blood felt hot". She also reports a fluttering sensation in her central chest which is sporadic without associated pain. Patient took  Lisinopril at 0720 today with additional  doses at 0845, 1430, and 1645, still with no improvement in her BP. BP on arrival 190/86. Patient denies LOC, vomiting, chest pain, shortness of breath, fever, extremity weakness, headache, and hematuria.  As an aside, patient c/o dysuria x "several weeks". She reports that her PCP put her on Keflex for this a few weeks ago.  PCP - Dr. Arlyce Dice Cardiologist - Dr. Tenny Craw  Patient is a 74 y.o. female presenting with hypertension. The history is provided by the patient. No language interpreter was used.   Hypertension Associated symptoms include fatigue and nausea. Pertinent negatives include no chest pain, fever, vomiting or weakness.    Past Medical History  Diagnosis Date  . Arrhythmia     palps  . Fatigue   . Dizziness   . Hypertension   . SVT (supraventricular tachycardia)   . Mitral valve prolapse   . Headache(784.0)   . UTI (lower urinary tract infection)   . Anxiety   . COPD (chronic obstructive pulmonary disease)   . Radiculopathy   . H/O: hysterectomy    Past Surgical History  Procedure Laterality Date  . Cardiac catheterization  12/95  . Rotator cuff repair    . Appendectomy    . Cholecystectomy     Family History  Problem Relation Age of Onset  . Heart attack Other   . Asthma Other   . Heart failure Other   . Osteoporosis Other   . Heart attack Mother   . Heart Problems Brother    History  Substance Use Topics  . Smoking status: Current Every Day Smoker  . Smokeless tobacco: Not on file  . Alcohol Use: Not on file   OB History    No data available      Review of Systems  Constitutional: Positive for fatigue. Negative for fever.  Respiratory: Negative for shortness of breath.   Cardiovascular: Positive for palpitations. Negative for chest pain.  Gastrointestinal: Positive for nausea. Negative for vomiting.  Genitourinary: Positive for dysuria.  Neurological: Positive for light-headedness. Negative for syncope and weakness.  All other systems reviewed and are negative.   Allergies  Dobutamine; Epinephrine; Darvocet;  Excedrin extra strength; Clarithromycin; Doxycycline; Penicillins; and Prednisone  Home Medications   Prior to Admission medications   Medication Sig Start Date End Date Taking? Authorizing Provider  ALPRAZolam [name]) 0.5 MG tablet Take 0.5 mg by mouth 3 (three) times daily.     Historical Provider, MD  amLODipine (NORVASC) 10 MG tablet Take 0.5 tablets (5 mg total) by mouth daily. 01/19/15   Antony Madura, PA-C   butalbital-acetaminophen-caffeine (FIORICET, ESGIC) [phone] MG per tablet  07/06/14   Historical Provider, MD  cholecalciferol (VITAMIN D) 1000 UNITS tablet Take 1,000 Units by mouth daily.    Historical Provider, MD  fexofenadine-pseudoephedrine (ALLEGRA-D) 60-120 MG per tablet Take 1 tablet by mouth daily as needed (for allergies).    Historical Provider, MD  fish oil-omega-3 fatty acids 1000 MG capsule Take 1 g by mouth daily.     Historical Provider, MD  flecainide (TAMBOCOR) 100 MG tablet Take 1 tablet (100 mg total) by mouth 2 (two) times daily. 04/20/11 07/13/14  Pricilla Riffle, MD  FLUoxetine (PROZAC) 40 MG capsule Take 40 mg by mouth daily.      Historical Provider, MD  fluticasone Aleda Grana) 50 MCG/ACT nasal spray  10/11/14   Historical Provider, MD  lisinopril (PRINIVIL,ZESTRIL) 10 MG tablet Take 2 tablets (20 mg total) by mouth daily. 01/19/15   Antony Madura, PA-C  meclizine (ANTIVERT) 25 MG tablet Take 25 mg by mouth 3 (three) times daily. 04/04/14   Historical Provider, MD  metoprolol succinate (TOPROL XL) 25 MG 24 hr tablet Take 1 tablet (25 mg total) by mouth daily. 07/30/14   Pricilla Riffle, MD  Multiple Vitamins-Minerals (MULTIVITAMIN PO) Take 1 tablet by mouth daily.    Historical Provider, MD  omeprazole (PRILOSEC) 40 MG capsule Take 40 mg by mouth daily.      Historical Provider, MD  pyridostigmine (MESTINON) 60 MG tablet Take 1 tablet (60 mg total) by mouth 2 (two) times daily. Patient not taking: Reported on 12/25/2014 10/22/14   Pricilla Riffle, MD   BP 177/69 mmHg  Pulse 57  Temp(Src) 98.7 F (37.1 C)  Resp 18  SpO2 95%   Physical Exam  Constitutional: She is oriented to person, place, and time. She appears well-developed and well-nourished. No distress.  Nontoxic/nonseptic appearing. Patient pleasant.  HENT:  Head: Normocephalic and atraumatic.  Eyes: Conjunctivae and EOM are normal. No scleral icterus.  Neck: Normal range of motion.  Cardiovascular: Normal rate, regular  rhythm and intact distal pulses.   Pulmonary/Chest: Effort normal. No respiratory distress. She has no wheezes. She has no rales.  Respirations even and unlabored  Abdominal: Soft. She exhibits no distension. There is no tenderness. There is no rebound and no guarding.  Soft, nontender  Musculoskeletal: Normal range of motion.  Neurological: She is alert and oriented to person, place, and time. She exhibits normal muscle tone. Coordination normal.  GCS 15. Speech is goal oriented. Patient moves extremities without ataxia.  Skin: Skin is warm and dry. No rash noted. She is not diaphoretic. No erythema. No pallor.  Psychiatric: She has a normal mood and affect. Her behavior is normal.  Nursing note and vitals reviewed.   ED Course  Procedures (including critical care time) Labs Review Labs Reviewed  COMPREHENSIVE METABOLIC PANEL - Abnormal; Notable for the following:    Potassium 2.9 (*)    ALT 9 (*)    All other components within normal limits  CBC WITH DIFFERENTIAL/PLATELET - Abnormal; Notable for the following:    Hemoglobin  11.3 (*)    HCT 35.0 (*)    All other components within normal limits  URINALYSIS, ROUTINE W REFLEX MICROSCOPIC (NOT AT [facility]) - Abnormal; Notable for the following:    Specific Gravity, Urine 1.004 (*)    Hgb urine dipstick SMALL (*)    All other components within normal limits  URINE CULTURE  URINE MICROSCOPIC-ADD ON  TROPONIN I    Imaging Review Dg Chest 2 View  01/18/2015   CLINICAL DATA:  Nausea and vomiting  EXAM: CHEST  2 VIEW  COMPARISON:  10/22/2013  FINDINGS: Cardiac shadow is within normal limits. The lungs are well aerated bilaterally. Minimal scarring is noted in the bases stable from the prior study. No acute bony abnormality is seen.  IMPRESSION: Minimal basilar scarring.   Electronically Signed   By: Alcide CleverMark  Lukens M.D.   On: 01/18/2015 21:07   Dg Lumbar Spine Complete  01/18/2015   CLINICAL DATA:  Low back pain, no known injury, initial  encounter  EXAM: LUMBAR SPINE - COMPLETE 4+ VIEW  COMPARISON:  None.  FINDINGS: Five lumbar type vertebral bodies are well visualized. Vertebral body height is well maintained. Mild osteophytic changes and facet hypertrophic changes are seen. Disc space narrowing is noted most marked at L2-3 but to a lesser degree at L3-4. No anterolisthesis is noted.  IMPRESSION: Degenerative changes without acute abnormality.   Electronically Signed   By: Alcide CleverMark  Lukens M.D.   On: 01/18/2015 20:46     EKG Interpretation   Date/Time:  Friday Jan 18 2015 19:50:51 EDT Ventricular Rate:  57 PR Interval:  226 QRS Duration: 106 QT Interval:  526 QTC Calculation: 511 R Axis:   -60 Text Interpretation:  Sinus bradycardia with 1st degree A-V block Left  axis deviation Incomplete left bundle branch block Nonspecific T wave  abnormality Prolonged QT Abnormal ECG Confirmed by Lincoln Brighamees, Liz [phone]([id number]) on  01/18/2015 8:07:06 PM      MDM   Final diagnoses:  Essential hypertension    74 year old female presents to the emergency department for further evaluation of hypertension. Patient denies associated chest pain, shortness of breath, headache, syncope, or visual changes. She reports vague complaints of nausea and fatigue. Blood pressure 200/82 on arrival. Patient has a nonischemic EKG today. Troponin is negative. Chest x-ray shows no mediastinal widening or other acute cardiopulmonary process. No evidence of kidney failure. No leukocytosis or anemia.  Symptoms treated in ED with clonidine. Blood pressure has improved to 177/69. I have a high suspicion that patient's hypertension is being worsened by her anxiety. Case discussed in passing with cardiology fellow, Dr. Loney Lohathore, who recommends increasing Lisinopril from 5mg  to 20mg . He also recommends adding 5mg  Amlodipine to daily regimen. Plan discussed with patient who verbalizes understanding. She has scheduled follow-up with her cardiologist on 01/24/2015. No indication for  further emergent workup or admission at this time. Patient stable for discharge with no unaddressed concerns. Return precautions given.   Filed Vitals:   01/18/15 2230 01/18/15 2300 01/18/15 2330 01/19/15 0000  BP: 206/89 187/101 191/88 177/69  Pulse: 57     Temp:      Resp: 17 19 12 18   SpO2: 95%        Antony MaduraKelly Alton Bouknight, PA-C 01/19/15 0026  Tilden FossaElizabeth Rees, MD 01/19/15 [phone]

## 2015-01-18 NOTE — ED Provider Notes (Signed)
Pt seen and medically screened in Pod F.  Initially placed in Pod F as Level 4 complaint, will be moved to main ED, level 603.    74 year old pt p/w uncontrolled hypertension for the past several weeks.  Has several measurements noted in her book from today, the highest was 185/90.  Had an episode of numbness in her bilateral lower extremities last night without weakness.  Over the past few weeks has developed lower back pain and dysuria.  Has also had increased episodes of palpitations over the past few days, hx SVT, arrhythmia.  Cardiologist Dr Tenny Crawoss.    Pt examined and appears stable to move to main ED for further evaluation and treatment.  CBC, CMP, UA, lumbar spine xray, EKG, cardiac monitoring ordered.   Other workup and evaluation deferred to provider in Main ED.    Trixie Dredgemily Keshawna Dix, PA-C 01/18/15 1932  Geoffery Lyonsouglas Delo, MD 01/19/15 [phone]

## 2015-01-19 MED ORDER — AMLODIPINE BESYLATE 10 MG PO TABS: 5.0000 mg | ORAL_TABLET | Freq: Every day | ORAL | Status: DC

## 2015-01-19 MED ORDER — LISINOPRIL 10 MG PO TABS: 20.0000 mg | ORAL_TABLET | Freq: Every day | ORAL | Status: DC

## 2015-01-19 NOTE — Discharge Instructions (Signed)
Take  of Amlodipine (Norvasc) and  Lisinopril in the morning. Continue taking  Metoprolol at nighttime. Take Xanax as prescribed to your for anxiety, or if you continue to notice high blood pressure. Follow up with Dr. Tenny Craw. Follow up with your primary care doctor as needed. Return to the ED as needed if symptoms worsen.  Hypertension Hypertension, commonly called high blood pressure, is when the force of blood pumping through your arteries is too strong. Your arteries are the blood vessels that carry blood from your heart throughout your body. A blood pressure reading consists of a higher number over a lower number, such as 110/72. The higher number (systolic) is the pressure inside your arteries when your heart pumps. The lower number (diastolic) is the pressure inside your arteries when your heart relaxes. Ideally you want your blood pressure below 120/80. Hypertension forces your heart to work harder to pump blood. Your arteries may become narrow or stiff. Having hypertension puts you at risk for heart disease, stroke, and other problems.  RISK FACTORS Some risk factors for high blood pressure are controllable. Others are not.  Risk factors you cannot control include:   Race. You may be at higher risk if you are African American.  Age. Risk increases with age.  Gender. Men are at higher risk than women before age 68 years. After age 30, women are at higher risk than men. Risk factors you can control include:  Not getting enough exercise or physical activity.  Being overweight.  Getting too much fat, sugar, calories, or salt in your diet.  Drinking too much alcohol. SIGNS AND SYMPTOMS Hypertension does not usually cause signs or symptoms. Extremely high blood pressure (hypertensive crisis) may cause headache, anxiety, shortness of breath, and nosebleed. DIAGNOSIS  To check if you have hypertension, your health care provider will measure your blood pressure while you are  seated, with your arm held at the level of your heart. It should be measured at least twice using the same arm. Certain conditions can cause a difference in blood pressure between your right and left arms. A blood pressure reading that is higher than normal on one occasion does not mean that you need treatment. If one blood pressure reading is high, ask your health care provider about having it checked again. TREATMENT  Treating high blood pressure includes making lifestyle changes and possibly taking medicine. Living a healthy lifestyle can help lower high blood pressure. You may need to change some of your habits. Lifestyle changes may include:  Following the DASH diet. This diet is high in fruits, vegetables, and whole grains. It is low in salt, red meat, and added sugars.  Getting at least 2 hours of brisk physical activity every week.  Losing weight if necessary.  Not smoking.  Limiting alcoholic beverages.  Learning ways to reduce stress. If lifestyle changes are not enough to get your blood pressure under control, your health care provider may prescribe medicine. You may need to take more than one. Work closely with your health care provider to understand the risks and benefits. HOME CARE INSTRUCTIONS  Have your blood pressure rechecked as directed by your health care provider.   Take medicines only as directed by your health care provider. Follow the directions carefully. Blood pressure medicines must be taken as prescribed. The medicine does not work as well when you skip doses. Skipping doses also puts you at risk for problems.   Do not smoke.   Monitor your blood pressure at home  as directed by your health care provider. SEEK MEDICAL CARE IF:   You think you are having a reaction to medicines taken.  You have recurrent headaches or feel dizzy.  You have swelling in your ankles.  You have trouble with your vision. SEEK IMMEDIATE MEDICAL CARE IF:  You develop a  severe headache or confusion.  You have unusual weakness, numbness, or feel faint.  You have severe chest or abdominal pain.  You vomit repeatedly.  You have trouble breathing. MAKE SURE YOU:   Understand these instructions.  Will watch your condition.  Will get help right away if you are not doing well or get worse. Document Released: 08/10/2005 Document Revised: 12/25/2013 Document Reviewed: 06/02/2013 [facility] Patient Information 2015 [place]. This information is not intended to replace advice given to you by your health care provider. Make sure you discuss any questions you have with your health care provider.

## 2015-01-20 LAB — URINE CULTURE
COLONY COUNT: NO GROWTH
CULTURE: NO GROWTH

## 2015-01-24 ENCOUNTER — Encounter: Payer: Self-pay | Admitting: *Deleted

## 2015-01-24 ENCOUNTER — Ambulatory Visit (INDEPENDENT_AMBULATORY_CARE_PROVIDER_SITE_OTHER): Payer: Medicare HMO | Admitting: Internal Medicine

## 2015-01-24 VITALS — BP 114/58 | HR 59 | Ht 63.0 in | Wt 131.0 lb

## 2015-01-24 DIAGNOSIS — I10 Essential (primary) hypertension: Secondary | ICD-10-CM | POA: Diagnosis not present

## 2015-01-24 NOTE — Patient Instructions (Signed)
Medication Instructions:  No changes today.  Labwork: BMET today  Testing/Procedures: None today  Follow-Up: Follow up with Dr Tenny Crawoss will be determined after testing has been done.

## 2015-01-24 NOTE — Progress Notes (Signed)
Cardiology Office Note   Date:  01/24/2015   ID:  [name], DOB Dec 15, 1940, MRN [id number]  PCP:  Lilia Argue  Cardiologist:   Dietrich Pates, MD   No chief complaint on file.     History of Present Illness: Barbara Hill is a 74 y.o. female with a history of  History of Present Illness: Barbara Hill is a 74 y.o. female with a history of palpitations Previously followed by Jonette Eva. I saw her earlier this winter  [facility] also has a history of autonomic dysfunction.  When I saw her I recomm that she try mestinon She did not know what this was for Had it filled but did nto take it  Since seenshe has continued to have dizzy spells No syncope BP has been elevated at times Husband has given her 1/2 of Lisnopril/HCTZ to take  I saw the pt in Feb  At that time I added mestinon.  Also added prn lisinopril (5 mg)    Patient called in with multiple high readings since  Have switched lisinoprl to 5 mg daily  On 5/27 went to ED>  In ER BP was 177/69  Lisinoprl was increased to 20 mg and amldipine was added 5 mg      Current Outpatient Prescriptions  Medication Sig Dispense Refill  . ALPRAZolam (XANAX) 0.5 MG tablet Take 0.5 mg by mouth 3 (three) times daily.     Marland Kitchen amLODipine (NORVASC) 10 MG tablet Take 0.5 tablets (5 mg total) by mouth daily. 15 tablet 0  . butalbital-acetaminophen-caffeine (FIORICET, ESGIC) 50-325-40 MG per tablet   3  . fexofenadine-pseudoephedrine (ALLEGRA-D) 60-120 MG per tablet Take 1 tablet by mouth daily as needed (for allergies).    . fish oil-omega-3 fatty acids 1000 MG capsule Take 1 g by mouth daily.     . flecainide (TAMBOCOR) 100 MG tablet Take 100 mg by mouth 2 (two) times daily.  1  . FLUoxetine (PROZAC) 40 MG capsule Take 40 mg by mouth daily.      . fluticasone (FLONASE) 50 MCG/ACT nasal spray   11  . lisinopril (PRINIVIL,ZESTRIL) 10 MG tablet Take 2 tablets (20 mg total) by mouth daily. (Patient taking differently: Take 10 mg by  mouth daily. ) 30 tablet 0  . loperamide (IMODIUM A-D) 2 MG tablet Take 2 mg by mouth daily as needed for diarrhea or loose stools.    . meclizine (ANTIVERT) 25 MG tablet Take 25 mg by mouth 3 (three) times daily.    . metoprolol succinate (TOPROL-XL) 50 MG 24 hr tablet Take 25 tablets by mouth at bedtime.     . Multiple Vitamins-Minerals (MULTIVITAMIN PO) Take 1 tablet by mouth daily.    Marland Kitchen omeprazole (PRILOSEC) 40 MG capsule Take 40 mg by mouth daily.      . Vitamin D, Ergocalciferol, (DRISDOL) 50000 UNITS CAPS capsule Take 1 capsule by mouth once a week.  0   No current facility-administered medications for this visit.    Allergies:   Dobutamine; Epinephrine; Darvocet; Excedrin extra strength; Clarithromycin; Doxycycline; Penicillins; and Prednisone   Past Medical History  Diagnosis Date  . Arrhythmia     palps  . Fatigue   . Dizziness   . Hypertension   . SVT (supraventricular tachycardia)   . Mitral valve prolapse   . Headache(784.0)   . UTI (lower urinary tract infection)   . Anxiety   . COPD (chronic obstructive pulmonary disease)   . Radiculopathy   .  H/O: hysterectomy     Past Surgical History  Procedure Laterality Date  . Cardiac catheterization  12/95  . Rotator cuff repair    . Appendectomy    . Cholecystectomy    . Abdominal hysterectomy       Social History:  The patient  reports that she has been smoking.  She does not have any smokeless tobacco history on file.   Family History:  The patient's family history includes Asthma in her other; CAD in an other family member; Heart Problems in her brother; Heart attack in her mother and other; Heart attack (age of onset: 6480) in her father; Heart failure in her other; Osteoporosis in her other; Transient ischemic attack in her mother.    ROS:  Please see the history of present illness. All other systems are reviewed and  Negative to the above problem except as noted.    PHYSICAL EXAM: VS:  BP 114/58 mmHg  Pulse  59  Ht 5\' 3"  (1.6 m)  Wt 131 lb (59.421 kg)  BMI 23.21 kg/m2  SpO2 95%  GEN:  Thin 74 yo , in no acute distress HEENT: normal Neck: no JVD, carotid bruits, or masses Cardiac: RRR; no murmurs, rubs, or gallops,no edema  Respiratory:  clear to auscultation bilaterally, normal work of breathing GI: soft, nontender, nondistended, + BS  No hepatomegaly  MS: no deformity Moving all extremities   Skin: warm and dry, no rash Neuro:  Strength and sensation are intact Psych: euthymic mood, full affect   EKG:  EKG is not  ordered today.   Lipid Panel    Component Value Date/Time   CHOL 199 07/21/2013 1456   TRIG 104.0 07/21/2013 1456   HDL 63.60 07/21/2013 1456   CHOLHDL 3 07/21/2013 1456   VLDL 20.8 07/21/2013 1456   LDLCALC 115* 07/21/2013 1456      Wt Readings from Last 3 Encounters:  01/24/15 131 lb (59.421 kg)  12/25/14 130 lb 12.8 oz (59.33 kg)  10/22/14 130 lb (58.968 kg)      ASSESSMENT AND PLAN: 1.  HTN  BP is better on current regimen  It is actually a little low  For her.   Symptom wise today she is not that symptomatic though she has been at home  [facility] (amazingly) even though BP in past has been very labile with standing.   I do not think she has started Mestinon  I need to confirm her meds with her   2.  Palpitations  Denies  Right now maintained on flecanide     F/U will be based on what Meds she is taking   Signed, Dietrich PatesPaula Emmarae Cowdery, MD  01/24/2015 4:05 PM    [facility] [address], [place], [place]  [address] Phone: [phone]; Fax: [phone]

## 2015-01-25 LAB — BASIC METABOLIC PANEL
BUN: 8 mg/dL (ref 6–23)
CO2: 29 meq/L (ref 19–32)
CREATININE: 0.98 mg/dL (ref 0.40–1.20)
Calcium: 9 mg/dL (ref 8.4–10.5)
Chloride: 101 mEq/L (ref 96–112)
GFR: 59.13 mL/min — ABNORMAL LOW (ref >60.00)
GLUCOSE: 86 mg/dL (ref 70–99)
Potassium: 3.7 mEq/L (ref 3.5–5.1)
Sodium: 135 mEq/L (ref 135–145)

## 2015-02-04 ENCOUNTER — Other Ambulatory Visit: Payer: Self-pay | Admitting: Internal Medicine

## 2015-02-11 ENCOUNTER — Telehealth: Payer: Self-pay | Admitting: Internal Medicine

## 2015-02-11 ENCOUNTER — Other Ambulatory Visit: Payer: Self-pay | Admitting: *Deleted

## 2015-02-11 MED ORDER — LISINOPRIL 10 MG PO TABS: 20.0000 mg | ORAL_TABLET | Freq: Every day | ORAL | Status: DC

## 2015-02-11 MED ORDER — AMLODIPINE BESYLATE 10 MG PO TABS: 10.0000 mg | ORAL_TABLET | Freq: Every day | ORAL | Status: DC

## 2015-02-11 NOTE — Telephone Encounter (Signed)
C/o blood pressure being elevated since 02/09/15 as documented by Stanton Kidney More.  Denies any chest pain, dyspnea, headache, visual changes, confused-foggy feeling or weakness.  Taking her medications as prescribed but stopped pyridostigmine 2 days ago do to diarrhea and upset stomach. Has been drinking a lot of Gatorade because they tell her she needs to drink more fluids.  Seen in ED on 01/18/15 for blood pressure being high with symptoms of numbness bilateral lower extremities.  Advised to change fluid type away from Gatorade.  Made an appointment for patient on 02/21/15 office visit. Patient will continue to monitor blood pressure twice a day and watch for any symptoms. Will route to her provider and nurse and follow up with APP for any instructions.   Discussed with LTyrone Sage NP/ Instructed patient to stop Allegra, increase Norvasc to 10 mg, stop salt and keep appointment

## 2015-02-11 NOTE — Telephone Encounter (Signed)
Pt c/o BP issue: STAT if pt c/o blurred vision, one-sided weakness or slurred speech  1. What are your last 5 BP readings?  6.20.16   3pm        161/69    6.19.16   6:30pm     185/76 6.18.16   4pm         197/76 6.18.16   8pm          183/65 6.18.16   11pm        166/60     2. Are you having any other symptoms (ex. Dizziness, headache, blurred vision, passed out)? Numb  3. What is your BP issue? High BP

## 2015-02-11 NOTE — Telephone Encounter (Signed)
Changed to 60/11 per pharm request.. Called and let pharm know, spoke to [place].Marland Kitchen

## 2015-02-14 ENCOUNTER — Telehealth: Payer: Self-pay | Admitting: Internal Medicine

## 2015-02-14 NOTE — Telephone Encounter (Signed)
New Message       Pt calling stating that she doesn't know if she can wait to come in on 02/21/15 for her appt. She thinks she needs to be seen today so she wants to know if she can come in today or if she needs to go to the ER. Pt states she had blood in stool 3x yesterday and diarrhea. Please call back and advise.

## 2015-02-14 NOTE — Telephone Encounter (Signed)
F/u   Pt called back stating she is waiting on a call from nurse.

## 2015-02-14 NOTE — Telephone Encounter (Signed)
I spoke with patient today (12:30 pm). "i was just heading to the ER, i need to know what to do about my stomach" States shes been having abdominal discomfort for a week or more, and yesterday there was blood in the toilet with 2 bowel movements yesterday.  Her BP today is 176/71, she has not taken the amlodipine yet today, and when she does she splits the 10 mg in half and takes it twice a day.  Advised patient to take the amlodipine 10mg  all at once, right now.   Advised her to contact her PCP, to discuss her abdominal pain/bleeding.  She verbalizes understanding and agreement with this plan. She will keep her already scheduled appointment with Dr. Tenny Craw on 6/30.

## 2015-02-15 ENCOUNTER — Encounter (HOSPITAL_COMMUNITY): Payer: Self-pay | Admitting: Physical Medicine and Rehabilitation

## 2015-02-15 ENCOUNTER — Emergency Department (HOSPITAL_COMMUNITY): Payer: Medicare HMO

## 2015-02-15 ENCOUNTER — Emergency Department (HOSPITAL_COMMUNITY)
Admission: EM | Admit: 2015-02-15 | Discharge: 2015-02-15 | Disposition: A | Discharge status: Home / Self Care | Payer: Medicare HMO | Attending: Emergency Medicine | Admitting: Emergency Medicine

## 2015-02-15 DIAGNOSIS — Z8739 Personal history of other diseases of the musculoskeletal system and connective tissue: Secondary | ICD-10-CM | POA: Insufficient documentation

## 2015-02-15 DIAGNOSIS — J449 Chronic obstructive pulmonary disease, unspecified: Secondary | ICD-10-CM | POA: Diagnosis not present

## 2015-02-15 DIAGNOSIS — R109 Unspecified abdominal pain: Secondary | ICD-10-CM | POA: Diagnosis present

## 2015-02-15 DIAGNOSIS — Z90710 Acquired absence of both cervix and uterus: Secondary | ICD-10-CM | POA: Diagnosis not present

## 2015-02-15 DIAGNOSIS — Z79899 Other long term (current) drug therapy: Secondary | ICD-10-CM | POA: Diagnosis not present

## 2015-02-15 DIAGNOSIS — Z880 Allergy status to penicillin: Secondary | ICD-10-CM | POA: Insufficient documentation

## 2015-02-15 DIAGNOSIS — I10 Essential (primary) hypertension: Secondary | ICD-10-CM | POA: Insufficient documentation

## 2015-02-15 DIAGNOSIS — Z9089 Acquired absence of other organs: Secondary | ICD-10-CM | POA: Diagnosis not present

## 2015-02-15 DIAGNOSIS — K625 Hemorrhage of anus and rectum: Secondary | ICD-10-CM | POA: Diagnosis not present

## 2015-02-15 DIAGNOSIS — Z720 Tobacco use: Secondary | ICD-10-CM | POA: Diagnosis not present

## 2015-02-15 DIAGNOSIS — Z87440 Personal history of urinary (tract) infections: Secondary | ICD-10-CM | POA: Diagnosis not present

## 2015-02-15 DIAGNOSIS — Z9889 Other specified postprocedural states: Secondary | ICD-10-CM | POA: Insufficient documentation

## 2015-02-15 DIAGNOSIS — Z9049 Acquired absence of other specified parts of digestive tract: Secondary | ICD-10-CM | POA: Insufficient documentation

## 2015-02-15 DIAGNOSIS — Z7951 Long term (current) use of inhaled steroids: Secondary | ICD-10-CM | POA: Diagnosis not present

## 2015-02-15 DIAGNOSIS — F419 Anxiety disorder, unspecified: Secondary | ICD-10-CM | POA: Diagnosis not present

## 2015-02-15 DIAGNOSIS — K529 Noninfective gastroenteritis and colitis, unspecified: Secondary | ICD-10-CM | POA: Insufficient documentation

## 2015-02-15 LAB — COMPREHENSIVE METABOLIC PANEL
ALT: 11 U/L — AB (ref 14–54)
AST: 19 U/L (ref 15–41)
Albumin: 3.7 g/dL (ref 3.5–5.0)
Alkaline Phosphatase: 81 U/L (ref 38–126)
Anion gap: 11 (ref 5–15)
BUN: <5 mg/dL — ABNORMAL LOW (ref 6–20)
CALCIUM: 9.7 mg/dL (ref 8.9–10.3)
CHLORIDE: 98 mmol/L — AB (ref 101–111)
CO2: 29 mmol/L (ref 22–32)
Creatinine, Ser: 0.76 mg/dL (ref 0.44–1.00)
GFR calc Af Amer: >60 mL/min (ref >60)
GFR calc non Af Amer: >60 mL/min (ref >60)
Glucose, Bld: 97 mg/dL (ref 65–99)
Potassium: 2.8 mmol/L — ABNORMAL LOW (ref 3.5–5.1)
SODIUM: 138 mmol/L (ref 135–145)
Total Bilirubin: 0.4 mg/dL (ref 0.3–1.2)
Total Protein: 7.7 g/dL (ref 6.5–8.1)

## 2015-02-15 LAB — CBC WITH DIFFERENTIAL/PLATELET
Basophils Absolute: 0 10*3/uL (ref 0.0–0.1)
Basophils Relative: 1 % (ref 0–1)
Eosinophils Absolute: 0.3 10*3/uL (ref 0.0–0.7)
Eosinophils Relative: 3 % (ref 0–5)
HCT: 40.2 % (ref 36.0–46.0)
Hemoglobin: 13.5 g/dL (ref 12.0–15.0)
LYMPHS ABS: 2.7 10*3/uL (ref 0.7–4.0)
LYMPHS PCT: 32 % (ref 12–46)
MCH: 29.3 pg (ref 26.0–34.0)
MCHC: 33.6 g/dL (ref 30.0–36.0)
MCV: 87.4 fL (ref 78.0–100.0)
MONOS PCT: 9 % (ref 3–12)
Monocytes Absolute: 0.7 10*3/uL (ref 0.1–1.0)
NEUTROS ABS: 4.7 10*3/uL (ref 1.7–7.7)
NEUTROS PCT: 55 % (ref 43–77)
Platelets: 350 10*3/uL (ref 150–400)
RBC: 4.6 MIL/uL (ref 3.87–5.11)
RDW: 13.1 % (ref 11.5–15.5)
WBC: 8.4 10*3/uL (ref 4.0–10.5)

## 2015-02-15 LAB — LIPASE, BLOOD: LIPASE: 37 U/L (ref 22–51)

## 2015-02-15 MED ORDER — IOHEXOL 300 MG/ML  SOLN: 25.0000 mL | Freq: Once | INTRAMUSCULAR | Status: DC | PRN

## 2015-02-15 MED ORDER — CIPROFLOXACIN HCL 500 MG PO TABS: 500.0000 mg | ORAL_TABLET | Freq: Two times a day (BID) | ORAL | Status: AC

## 2015-02-15 MED ORDER — METRONIDAZOLE IN NACL 5-0.79 MG/ML-% IV SOLN
500.0000 mg | Freq: Once | INTRAVENOUS | Status: AC
  Administered 2015-02-15: 500 mg via INTRAVENOUS
  Filled 2015-02-15: qty 100

## 2015-02-15 MED ORDER — POTASSIUM CHLORIDE CRYS ER 20 MEQ PO TBCR
60.0000 meq | EXTENDED_RELEASE_TABLET | Freq: Once | ORAL | Status: AC
  Administered 2015-02-15: 60 meq via ORAL
  Filled 2015-02-15: qty 3

## 2015-02-15 MED ORDER — CIPROFLOXACIN IN D5W 400 MG/200ML IV SOLN
400.0000 mg | Freq: Once | INTRAVENOUS | Status: AC
  Administered 2015-02-15: 400 mg via INTRAVENOUS
  Filled 2015-02-15: qty 200

## 2015-02-15 MED ORDER — IOHEXOL 300 MG/ML  SOLN
80.0000 mL | Freq: Once | INTRAMUSCULAR | Status: AC | PRN
  Administered 2015-02-15: 80 mL via INTRAVENOUS

## 2015-02-15 MED ORDER — SODIUM CHLORIDE 0.9 % IV BOLUS (SEPSIS)
500.0000 mL | Freq: Once | INTRAVENOUS | Status: AC
  Administered 2015-02-15: 500 mL via INTRAVENOUS

## 2015-02-15 NOTE — ED Notes (Signed)
Pt presents to department for evaluation of diffuse abdominal pain and rectal bleeding. Ongoing since Wednesday evening. 8/10 diffuse abdominal pain upon arrival to ED. Pt is alert and oriented x4.

## 2015-02-15 NOTE — ED Notes (Signed)
Patient transported to CT 

## 2015-02-15 NOTE — ED Provider Notes (Signed)
CSN: [id number]     Arrival date & time 02/15/15  1227 History   First MD Initiated Contact with Patient 02/15/15 1353     Chief Complaint  Patient presents with  . Rectal Bleeding  . Abdominal Pain     (Consider location/radiation/quality/duration/timing/severity/associated sxs/prior Treatment) HPI Patient presents with concern of abdominal pain and diarrhea, rectal bleeding.  Symptoms began about 2 days ago, initially with increased frequency of loose stool. Subsequent she developed blood in stool as well. Yesterday the patient saw primary care, was started on Flagyl. Over the past 24 hours patient has had reduced loose stool, but continues to pass blood with stool. Abdominal pain is diffuse, in the upper abdomen, sore, severe. There is no associated chest pain, dyspnea, lightheadedness, syncope, fever, chills. No history of diverticulitis or colitis.   Past Medical History  Diagnosis Date  . Arrhythmia     palps  . Fatigue   . Dizziness   . Hypertension   . SVT (supraventricular tachycardia)   . Mitral valve prolapse   . Headache(784.0)   . UTI (lower urinary tract infection)   . Anxiety   . COPD (chronic obstructive pulmonary disease)   . Radiculopathy   . H/O: hysterectomy    Past Surgical History  Procedure Laterality Date  . Cardiac catheterization  12/95  . Rotator cuff repair    . Appendectomy    . Cholecystectomy    . Abdominal hysterectomy     Family History  Problem Relation Age of Onset  . Heart attack Other   . Asthma Other   . Heart failure Other   . Osteoporosis Other   . Heart attack Mother   . Heart Problems Brother   . Transient ischemic attack Mother   . Heart attack Father 45  . CAD     History  Substance Use Topics  . Smoking status: Current Every Day Smoker  . Smokeless tobacco: Not on file  . Alcohol Use: No   OB History    No data available     Review of Systems  Constitutional:       Per HPI, otherwise negative  HENT:      Per HPI, otherwise negative  Respiratory:       Per HPI, otherwise negative  Cardiovascular:       Per HPI, otherwise negative  Gastrointestinal: Positive for nausea, abdominal pain and diarrhea. Negative for vomiting.  Endocrine:       Negative aside from HPI  Genitourinary:       Neg aside from HPI   Musculoskeletal:       Per HPI, otherwise negative  Skin: Negative.   Neurological: Negative for syncope.      Allergies  Dobutamine; Epinephrine; Darvocet; Excedrin extra strength; Clarithromycin; Doxycycline; Penicillins; and Prednisone  Home Medications   Prior to Admission medications   Medication Sig Start Date End Date Taking? Authorizing Provider  ALPRAZolam [name]) 0.5 MG tablet Take 0.5 mg by mouth 3 (three) times daily.     Historical Provider, MD  amLODipine (NORVASC) 10 MG tablet Take 1 tablet (10 mg total) by mouth daily. 02/11/15   Rosalio Macadamia, NP  butalbital-acetaminophen-caffeine (FIORICET, ESGIC) [phone] MG per tablet  07/06/14   Historical Provider, MD  fexofenadine-pseudoephedrine (ALLEGRA-D) 60-120 MG per tablet Take 1 tablet by mouth daily as needed (for allergies).    Historical Provider, MD  fish oil-omega-3 fatty acids 1000 MG capsule Take 1 g by mouth daily.     Historical  Provider, MD  flecainide (TAMBOCOR) 100 MG tablet Take 100 mg by mouth 2 (two) times daily. 01/07/15   Historical Provider, MD  FLUoxetine (PROZAC) 40 MG capsule Take 40 mg by mouth daily.      Historical Provider, MD  fluticasone Aleda Grana) 50 MCG/ACT nasal spray  10/11/14   Historical Provider, MD  lisinopril (PRINIVIL,ZESTRIL) 10 MG tablet Take 2 tablets (20 mg total) by mouth daily. 02/11/15   Pricilla Riffle, MD  loperamide (IMODIUM A-D) 2 MG tablet Take 2 mg by mouth daily as needed for diarrhea or loose stools.    Historical Provider, MD  meclizine (ANTIVERT) 25 MG tablet Take 25 mg by mouth 3 (three) times daily. 04/04/14   Historical Provider, MD  metoprolol succinate (TOPROL-XL)  50 MG 24 hr tablet Take 25 tablets by mouth at bedtime.  01/15/15   Historical Provider, MD  Multiple Vitamins-Minerals (MULTIVITAMIN PO) Take 1 tablet by mouth daily.    Historical Provider, MD  omeprazole (PRILOSEC) 40 MG capsule Take 40 mg by mouth daily.      Historical Provider, MD  Vitamin D, Ergocalciferol, (DRISDOL) 50000 UNITS CAPS capsule Take 1 capsule by mouth once a week. 10/29/14   Historical Provider, MD   BP 142/104 mmHg  Pulse 63  Temp(Src) 98.3 F (36.8 C) (Oral)  Resp 20  Ht  (1.6 m)  Wt 129 lb (58.514 kg)  BMI 22.86 kg/m2  SpO2 99% Physical Exam  Constitutional: She is oriented to person, place, and time. She appears well-developed and well-nourished. No distress.  HENT:  Head: Normocephalic and atraumatic.  Eyes: Conjunctivae and EOM are normal.  Cardiovascular: Normal rate and regular rhythm.   Pulmonary/Chest: Effort normal and breath sounds normal. No stridor. No respiratory distress.  Abdominal: She exhibits no distension.  Mild tenderness to palpation throughout the upper abdomen, no guarding, rebound  Musculoskeletal: She exhibits no edema.  Neurological: She is alert and oriented to person, place, and time. No cranial nerve deficit.  Skin: Skin is warm and dry.  Psychiatric: She has a normal mood and affect.  Nursing note and vitals reviewed.   ED Course  Procedures (including critical care time) Labs Review Labs Reviewed  COMPREHENSIVE METABOLIC PANEL - Abnormal; Notable for the following:    Potassium 2.8 (*)    Chloride 98 (*)    BUN <5 (*)    ALT 11 (*)    All other components within normal limits  CBC WITH DIFFERENTIAL/PLATELET  LIPASE, BLOOD    Imaging Review Ct Abdomen Pelvis W Contrast  02/15/2015   CLINICAL DATA:  Abdominal pain for several days with nausea and diarrhea  EXAM: CT ABDOMEN AND PELVIS WITH CONTRAST  TECHNIQUE: Multidetector CT imaging of the abdomen and pelvis was performed using the standard protocol following bolus  administration of intravenous contrast.  CONTRAST:  80mL OMNIPAQUE IOHEXOL 300 MG/ML  SOLN  COMPARISON:  09/08/2012  FINDINGS: The lung bases again demonstrate a small left lower lobe parenchymal nodule. It is stable in appearance from the prior exam and consistent with a benign etiology.  The gallbladder has been surgically removed. The liver, spleen, adrenal glands and pancreas are within normal limits. Kidneys demonstrate a normal enhancement pattern with normal excretion of contrast material. Scattered bilateral renal cysts are noted. No calculi or obstructive changes are seen.  The appendix is not visualized consistent with a prior surgical history. Scattered diverticular change of the colon is noted without significant diverticulitis. No free pelvic fluid is noted. The  osseous structures show degenerative change of the lumbar spine without acute abnormality.  IMPRESSION: Stable left lower lobe pulmonary nodule consistent with a benign etiology.  Diverticulosis without diverticulitis.  Renal cystic change without acute abnormality.   Electronically Signed   By: Alcide Clever M.D.   On: 02/15/2015 15:49   I reviewed the CT scan, agree with the interpretation.  Initial labs reassuring.   4:11 PM Patient awake, alert, blood pressure 150/90. We had a lengthy conversation about all findings, including the absence of diverticulitis, abscess, acute abdominal processes. Given the patient's ongoing discomfort, bloody stool, there is concern for colitis. Patient has no recent colonoscopy. Labs do not demonstrate acute drop in hemoglobin. No evidence for acute bacteremia or sepsis. Patient states that she can follow up with gastroenterology next week.  MDM  This patient presents with several days of abdominal pain, bloody stool. Notably, the patient has loose stool that has actually diminished since yesterday as she initiated Flagyl therapy. Patient's physical exam, description of symptoms suggests  colitis versus diverticulitis. No CT evidence for abscess or perforation. Given the patient's improvement with Flagyl, she'll continue this course medication, follow-up with gastroenterology next 3/5 days. Exposer return precautions provided.   Gerhard Munch, MD 02/15/15 [phone]

## 2015-02-15 NOTE — Discharge Instructions (Signed)
As discussed, today's evaluation for your abdominal pain and bloody stool suggests that you have colitis, or irritation of the large intestine. It is important to take all medication as directed, and be sure to follow-up with our gastroenterologists.  Return here for concerning changes.  As discussed, with continued passage of small amounts of blood is normal, as you recover from this illness. However if you develop new substantial bleeding, loss of consciousness, chest pain, dyspnea or other concerning changes, return here immediately.  Please take the previously prescribed Flagyl, as directed in addition to the newly prescribed Ciprofloxacin

## 2015-02-21 ENCOUNTER — Encounter (HOSPITAL_COMMUNITY): Payer: Self-pay | Admitting: Emergency Medicine

## 2015-02-21 ENCOUNTER — Ambulatory Visit: Payer: Self-pay | Admitting: Internal Medicine

## 2015-02-21 ENCOUNTER — Emergency Department (HOSPITAL_COMMUNITY): Payer: Medicare HMO

## 2015-02-21 ENCOUNTER — Emergency Department (HOSPITAL_COMMUNITY)
Admission: EM | Admit: 2015-02-21 | Discharge: 2015-02-21 | Disposition: A | Discharge status: Home / Self Care | Payer: Medicare HMO | Attending: Emergency Medicine | Admitting: Emergency Medicine

## 2015-02-21 DIAGNOSIS — Z880 Allergy status to penicillin: Secondary | ICD-10-CM | POA: Insufficient documentation

## 2015-02-21 DIAGNOSIS — Z720 Tobacco use: Secondary | ICD-10-CM | POA: Insufficient documentation

## 2015-02-21 DIAGNOSIS — F419 Anxiety disorder, unspecified: Secondary | ICD-10-CM | POA: Diagnosis not present

## 2015-02-21 DIAGNOSIS — J449 Chronic obstructive pulmonary disease, unspecified: Secondary | ICD-10-CM | POA: Insufficient documentation

## 2015-02-21 DIAGNOSIS — R1013 Epigastric pain: Secondary | ICD-10-CM | POA: Insufficient documentation

## 2015-02-21 DIAGNOSIS — Z9049 Acquired absence of other specified parts of digestive tract: Secondary | ICD-10-CM | POA: Insufficient documentation

## 2015-02-21 DIAGNOSIS — Z79899 Other long term (current) drug therapy: Secondary | ICD-10-CM | POA: Diagnosis not present

## 2015-02-21 DIAGNOSIS — Z8739 Personal history of other diseases of the musculoskeletal system and connective tissue: Secondary | ICD-10-CM | POA: Diagnosis not present

## 2015-02-21 DIAGNOSIS — I10 Essential (primary) hypertension: Secondary | ICD-10-CM | POA: Diagnosis not present

## 2015-02-21 DIAGNOSIS — Z9889 Other specified postprocedural states: Secondary | ICD-10-CM | POA: Insufficient documentation

## 2015-02-21 DIAGNOSIS — Z87440 Personal history of urinary (tract) infections: Secondary | ICD-10-CM | POA: Insufficient documentation

## 2015-02-21 DIAGNOSIS — Z90710 Acquired absence of both cervix and uterus: Secondary | ICD-10-CM | POA: Diagnosis not present

## 2015-02-21 DIAGNOSIS — R079 Chest pain, unspecified: Secondary | ICD-10-CM | POA: Diagnosis present

## 2015-02-21 LAB — COMPREHENSIVE METABOLIC PANEL
ALK PHOS: 81 U/L (ref 38–126)
ALT: 11 U/L — ABNORMAL LOW (ref 14–54)
AST: 21 U/L (ref 15–41)
Albumin: 3.8 g/dL (ref 3.5–5.0)
Anion gap: 9 (ref 5–15)
BUN: 6 mg/dL (ref 6–20)
CHLORIDE: 97 mmol/L — AB (ref 101–111)
CO2: 27 mmol/L (ref 22–32)
Calcium: 9.6 mg/dL (ref 8.9–10.3)
Creatinine, Ser: 1.02 mg/dL — ABNORMAL HIGH (ref 0.44–1.00)
GFR calc non Af Amer: 53 mL/min — ABNORMAL LOW (ref >60)
GLUCOSE: 108 mg/dL — AB (ref 65–99)
Potassium: 3.4 mmol/L — ABNORMAL LOW (ref 3.5–5.1)
Sodium: 133 mmol/L — ABNORMAL LOW (ref 135–145)
Total Bilirubin: 0.4 mg/dL (ref 0.3–1.2)
Total Protein: 8.1 g/dL (ref 6.5–8.1)

## 2015-02-21 LAB — CBC WITH DIFFERENTIAL/PLATELET
BASOS ABS: 0 10*3/uL (ref 0.0–0.1)
Basophils Relative: 1 % (ref 0–1)
Eosinophils Absolute: 0.3 10*3/uL (ref 0.0–0.7)
Eosinophils Relative: 4 % (ref 0–5)
HCT: 40.4 % (ref 36.0–46.0)
HEMOGLOBIN: 13.6 g/dL (ref 12.0–15.0)
Lymphocytes Relative: 25 % (ref 12–46)
Lymphs Abs: 2.1 10*3/uL (ref 0.7–4.0)
MCH: 29.5 pg (ref 26.0–34.0)
MCHC: 33.7 g/dL (ref 30.0–36.0)
MCV: 87.6 fL (ref 78.0–100.0)
MONO ABS: 0.7 10*3/uL (ref 0.1–1.0)
MONOS PCT: 8 % (ref 3–12)
Neutro Abs: 5.2 10*3/uL (ref 1.7–7.7)
Neutrophils Relative %: 62 % (ref 43–77)
Platelets: 338 10*3/uL (ref 150–400)
RBC: 4.61 MIL/uL (ref 3.87–5.11)
RDW: 13 % (ref 11.5–15.5)
WBC: 8.3 10*3/uL (ref 4.0–10.5)

## 2015-02-21 LAB — I-STAT TROPONIN, ED: Troponin i, poc: 0 ng/mL (ref 0.00–0.08)

## 2015-02-21 MED ORDER — RANITIDINE HCL 150 MG/10ML PO SYRP
150.0000 mg | ORAL_SOLUTION | Freq: Once | ORAL | Status: AC
  Administered 2015-02-21: 150 mg via ORAL
  Filled 2015-02-21: qty 10

## 2015-02-21 MED ORDER — GI COCKTAIL ~~LOC~~
30.0000 mL | Freq: Once | ORAL | Status: AC
  Administered 2015-02-21: 30 mL via ORAL
  Filled 2015-02-21: qty 30

## 2015-02-21 MED ORDER — SODIUM CHLORIDE 0.9 % IV BOLUS (SEPSIS)
1000.0000 mL | Freq: Once | INTRAVENOUS | Status: AC
  Administered 2015-02-21: 1000 mL via INTRAVENOUS

## 2015-02-21 MED ORDER — RANITIDINE HCL 150 MG PO TABS: 150.0000 mg | ORAL_TABLET | Freq: Two times a day (BID) | ORAL | Status: DC

## 2015-02-21 NOTE — ED Provider Notes (Signed)
CSN: [id number]     Arrival date & time 02/21/15  1644 History   First MD Initiated Contact with Patient 02/21/15 1835     Chief Complaint  Patient presents with  . Chest Pain     (Consider location/radiation/quality/duration/timing/severity/associated sxs/prior Treatment) HPI Barbara Hill is a 74 y.o. female history of SVT, mitral valve prolapse, comes in for evaluation of epigastric discomfort. This has been on going for the past several weeks. Patient states every time she eats she gets this same burning pain in her epigastrium. She reports trying to walk around the house to relieve the pain which sometimes helps, but does not fully resolve until she eats Tums or other antiacids. She denies any associated shortness of breath, diaphoresis. She does report becoming nauseated whenever she eats and experiences this discomfort. She denies ever experiencing this discomfort when she is walking or physically exerting herself. She does have a follow-up appointment with GI next Tuesday. She also reports follow-up with her cardiologist next week. No other aggravating or modifying factors.  Past Medical History  Diagnosis Date  . Arrhythmia     palps  . Fatigue   . Dizziness   . Hypertension   . SVT (supraventricular tachycardia)   . Mitral valve prolapse   . Headache(784.0)   . UTI (lower urinary tract infection)   . Anxiety   . COPD (chronic obstructive pulmonary disease)   . Radiculopathy   . H/O: hysterectomy    Past Surgical History  Procedure Laterality Date  . Cardiac catheterization  12/95  . Rotator cuff repair    . Appendectomy    . Cholecystectomy    . Abdominal hysterectomy     Family History  Problem Relation Age of Onset  . Heart attack Other   . Asthma Other   . Heart failure Other   . Osteoporosis Other   . Heart attack Mother   . Heart Problems Brother   . Transient ischemic attack Mother   . Heart attack Father 62  . CAD     History  Substance Use Topics   . Smoking status: Current Every Day Smoker  . Smokeless tobacco: Not on file  . Alcohol Use: No   OB History    No data available     Review of Systems A 10 point review of systems was completed and was negative except for pertinent positives and negatives as mentioned in the history of present illness     Allergies  Dobutamine; Epinephrine; Darvocet; Excedrin extra strength; Clarithromycin; Doxycycline; Penicillins; and Prednisone  Home Medications   Prior to Admission medications   Medication Sig Start Date End Date Taking? Authorizing Provider  acetaminophen (TYLENOL) 325 MG tablet Take 650 mg by mouth every 6 (six) hours as needed for mild pain, moderate pain or headache.   Yes Historical Provider, MD  ALPRAZolam [name]) 0.5 MG tablet Take 0.5 mg by mouth 3 (three) times daily.    Yes Historical Provider, MD  butalbital-acetaminophen-caffeine (FIORICET, ESGIC) 50-325-40 MG per tablet Take 1 tablet by mouth every 4 (four) hours as needed for headache or migraine.  07/06/14  Yes Historical Provider, MD  ciprofloxacin (CIPRO) 500 MG tablet Take 1 tablet (500 mg total) by mouth every 12 (twelve) hours. 02/15/15 02/22/15 Yes Gerhard Munch, MD  flecainide (TAMBOCOR) 100 MG tablet Take 100 mg by mouth 2 (two) times daily. 01/07/15  Yes Historical Provider, MD  FLUoxetine (PROZAC) 40 MG capsule Take 40 mg by mouth daily.  Yes Historical Provider, MD  fluticasone (FLONASE) 50 MCG/ACT nasal spray Place 1 spray into both nostrils daily as needed for allergies or rhinitis.  10/11/14  Yes Historical Provider, MD  lisinopril (PRINIVIL,ZESTRIL) 10 MG tablet Take 2 tablets (20 mg total) by mouth daily. Patient taking differently: Take 10 mg by mouth 2 (two) times daily.  02/11/15  Yes Pricilla Riffle, MD  loperamide (IMODIUM A-D) 2 MG tablet Take 2 mg by mouth 4 (four) times daily.    Yes Historical Provider, MD  meclizine (ANTIVERT) 25 MG tablet Take 12.5-25 mg by mouth 2 (two) times daily as  needed for dizziness or nausea.  04/04/14  Yes Historical Provider, MD  metoprolol succinate (TOPROL-XL) 50 MG 24 hr tablet Take 50 mg by mouth at bedtime.  01/15/15  Yes Historical Provider, MD  Multiple Vitamins-Minerals (MULTIVITAMIN PO) Take 1 tablet by mouth daily.   Yes Historical Provider, MD  omeprazole (PRILOSEC) 40 MG capsule Take 40 mg by mouth daily.     Yes Historical Provider, MD  Vitamin D, Ergocalciferol, (DRISDOL) 50000 UNITS CAPS capsule Take 1 capsule by mouth once a week. Take on Tuesdays 10/29/14  Yes Historical Provider, MD  amLODipine (NORVASC) 10 MG tablet Take 1 tablet (10 mg total) by mouth daily. Patient not taking: Reported on 02/21/2015 02/11/15   Rosalio Macadamia, NP  ranitidine (ZANTAC) 150 MG tablet Take 1 tablet (150 mg total) by mouth 2 (two) times daily. 02/21/15   Daisie Haft, PA-C   BP 168/73 mmHg  Pulse 63  Temp(Src) 98.3 F (36.8 C) (Oral)  Resp 18  Ht 5\' 3"  (1.6 m)  Wt 129 lb (58.514 kg)  BMI 22.86 kg/m2  SpO2 95% Physical Exam  Constitutional: She is oriented to person, place, and time. She appears well-developed and well-nourished.  HENT:  Head: Normocephalic and atraumatic.  Mouth/Throat: Oropharynx is clear and moist.  Eyes: Conjunctivae are normal. Pupils are equal, round, and reactive to light. Right eye exhibits no discharge. Left eye exhibits no discharge. No scleral icterus.  Neck: Neck supple.  Cardiovascular: Normal rate, regular rhythm and normal heart sounds.   Pulmonary/Chest: Effort normal and breath sounds normal. No respiratory distress. She has no wheezes. She has no rales.  Abdominal: Soft. There is no tenderness.  Musculoskeletal: She exhibits no tenderness.  Neurological: She is alert and oriented to person, place, and time.  Cranial Nerves II-XII grossly intact  Skin: Skin is warm and dry. No rash noted.  Psychiatric: She has a normal mood and affect.  Nursing note and vitals reviewed.   ED Course  Procedures (including  critical care time) Labs Review Labs Reviewed  COMPREHENSIVE METABOLIC PANEL - Abnormal; Notable for the following:    Sodium 133 (*)    Potassium 3.4 (*)    Chloride 97 (*)    Glucose, Bld 108 (*)    Creatinine, Ser 1.02 (*)    ALT 11 (*)    GFR calc non Af Amer 53 (*)    All other components within normal limits  CBC WITH DIFFERENTIAL/PLATELET  Rosezena Sensor, ED    Imaging Review Dg Chest 2 View  02/21/2015   CLINICAL DATA:  Mid chest pain and epigastric pain.  EXAM: CHEST  2 VIEW  COMPARISON:  01/18/2015  FINDINGS: The heart size and mediastinal contours are within normal limits. The lungs are hyperinflated but clear. Left basilar scarring noted. The visualized skeletal structures are unremarkable.  IMPRESSION: Hyperinflation and left basilar scarring.   Electronically  Signed   By: Signa Kellaylor  Stroud M.D.   On: 02/21/2015 19:25     EKG Interpretation None      ED ECG REPORT   Date: 02/21/2015  Rate: 67  Rhythm: normal sinus rhythm  QRS Axis: left  Intervals: PR prolonged  ST/T Wave abnormalities: normal  Conduction Disutrbances:first-degree A-V block   Narrative Interpretation:   Old EKG Reviewed: unchanged  I have personally reviewed the EKG tracing and agree with the computerized printout as noted.  Meds given in ED:  Medications  ranitidine (ZANTAC) 150 MG/10ML syrup 150 mg (not administered)  gi cocktail (Maalox,Lidocaine,Donnatal) (30 mLs Oral Given 02/21/15 1934)  sodium chloride 0.9 % bolus 1,000 mL (1,000 mLs Intravenous New Bag/Given 02/21/15 1955)    New Prescriptions   RANITIDINE (ZANTAC) 150 MG TABLET    Take 1 tablet (150 mg total) by mouth 2 (two) times daily.   Filed Vitals:   02/21/15 1957 02/21/15 2000 02/21/15 2015 02/21/15 2030  BP:  170/66 164/61 168/73  Pulse: 66 65 62 63  Temp:      TempSrc:      Resp: 16 14 18 18   Height:      Weight:      SpO2: 98% 96% 100% 95%    MDM  Vitals stable - WNL -afebrile Pt resting comfortably in ED.  reports she feels much better after administration of GI cocktail. PE--normal heart and lung exams. Physical exam is otherwise not concerning. Labwork--labs are noncontributory. Troponin negative. EKG is not concerning for any ischemic change.  Imaging-chest x-ray shows hyperinflation and left basilar scarring but no other cardiopulmonary pathology. I personally reviewed the imaging and agree with the results as interpreted by the radiologist.  DDX--patient presents with epigastric discomfort that she only experiences during eating and only relieved with walking and antiacids. Clinical presentation not consistent with ACS. Low suspicion for PE, mediastinitis, esophageal rupture. Symptoms likely secondary to reflux/GERD. Will initiate H2 RA and instruct patient to follow up with PCP. Also encouraged to maintain her appointment with her GI doctor next Tuesday.  I discussed all relevant lab findings and imaging results with pt and they verbalized understanding. Discussed f/u with PCP within 48 hrs and return precautions, pt very amenable to plan.  Final diagnoses:  Epigastric discomfort       Joycie PeekBenjamin Shahara Hartsfield, PA-C 02/21/15 2134  Mancel BaleElliott Wentz, MD 02/21/15 [phone]

## 2015-02-21 NOTE — ED Notes (Signed)
Pt c/o mid chest pain and epigastric pain onset 1 week ago.  Also c/o diarrhea.  Pt was seen here for same last week and has appt with gastro doctor but could not wait til then due to pain

## 2015-02-21 NOTE — Discharge Instructions (Signed)
You were evaluated in the ED today for your abdominal and chest discomfort. There is not appear to be an emergent cause her symptoms at this time. Your likely experiencing symptoms related to your reflux. Your exam, labs, EKG and chest x-ray are all reassuring. Please take your medications as prescribed to help with your symptoms. Please follow-up with PCP/gastroenterologist for further evaluation and management of your symptoms. Return to ED for worsening symptoms.

## 2015-02-21 NOTE — ED Notes (Signed)
Patient transported to X-ray 

## 2015-02-21 NOTE — ED Provider Notes (Signed)
  Face-to-face evaluation   History: She has had burning upper abdominal pain radiating to her chest for 1 week. It occurs on and off, and improves when she takes antiacids.  Physical exam: Alert, elderly female in mild distress. Abdomen has mild epigastric tenderness. There is no respiratory distress.  Medical screening examination/treatment/procedure(s) were conducted as a shared visit with non-physician practitioner(s) and myself.  I personally evaluated the patient during the encounter  Mancel BaleElliott Marabeth Melland, MD 02/23/15 [phone]

## 2015-04-26 ENCOUNTER — Ambulatory Visit: Payer: Self-pay | Admitting: Internal Medicine

## 2015-05-01 ENCOUNTER — Encounter: Payer: Self-pay | Admitting: Internal Medicine

## 2015-06-24 ENCOUNTER — Ambulatory Visit: Payer: Self-pay | Admitting: Internal Medicine

## 2015-09-06 ENCOUNTER — Encounter: Payer: Self-pay | Admitting: Internal Medicine

## 2015-09-06 ENCOUNTER — Ambulatory Visit (INDEPENDENT_AMBULATORY_CARE_PROVIDER_SITE_OTHER): Payer: Medicare HMO | Admitting: Internal Medicine

## 2015-09-06 VITALS — BP 144/88 | HR 62 | Ht 63.0 in | Wt 125.2 lb

## 2015-09-06 DIAGNOSIS — I471 Supraventricular tachycardia: Secondary | ICD-10-CM | POA: Diagnosis not present

## 2015-09-06 LAB — BASIC METABOLIC PANEL
BUN: 7 mg/dL (ref 7–25)
CALCIUM: 9.5 mg/dL (ref 8.6–10.4)
CO2: 30 mmol/L (ref 20–31)
Chloride: 101 mmol/L (ref 98–110)
Creat: 0.88 mg/dL (ref 0.60–0.93)
Glucose, Bld: 83 mg/dL (ref 65–99)
Potassium: 3.6 mmol/L (ref 3.5–5.3)
SODIUM: 140 mmol/L (ref 135–146)

## 2015-09-06 NOTE — Patient Instructions (Signed)
Medication Instructions:  Same-no changes  Labwork: BMET today   Testing/Procedures: None  Follow-Up: Your physician wants you to follow-up in: 7 months. You will receive a reminder letter in the mail two months in advance. If you don't receive a letter, please call our office to schedule the follow-up appointment.        If you need a refill on your cardiac medications before your next appointment, please call your pharmacy.

## 2015-09-06 NOTE — Progress Notes (Signed)
Cardiology Office Note   Date:  09/06/2015   ID:  Barbara HazelBrenda M Dambrosia, DOB 10/27/1940, MRN [id number]  PCP:  Lilia ArgueKAPLAN,KRISTEN, PA-C  Cardiologist:   Dietrich PatesPaula Modupe Shampine, MD    F/U of palpitations, dizziness and HTN  I saw her in June     History of Present Illness: Barbara Hill is a 75 y.o. female with a history of Palpitations  And dizziness and HTN   Since seen she denies syncope  Says dizziness is improved  Drinking more H2O   Still smoking    If she misses a flecanide she says her heart skips more      Current Outpatient Prescriptions  Medication Sig Dispense Refill  . acetaminophen (TYLENOL) 325 MG tablet Take 650 mg by mouth every 6 (six) hours as needed for mild pain, moderate pain or headache.    . ALPRAZolam (XANAX) 0.5 MG tablet Take 0.5 mg by mouth 3 (three) times daily.     Marland Kitchen. amLODipine (NORVASC) 10 MG tablet Take 1 tablet (10 mg total) by mouth daily. 30 tablet 0  . butalbital-acetaminophen-caffeine (FIORICET, ESGIC) 50-325-40 MG per tablet Take 1 tablet by mouth every 4 (four) hours as needed for headache or migraine.   3  . flecainide (TAMBOCOR) 100 MG tablet Take 100 mg by mouth 2 (two) times daily.  1  . FLUoxetine (PROZAC) 40 MG capsule Take 40 mg by mouth daily.      . fluticasone (FLONASE) 50 MCG/ACT nasal spray Place 1 spray into both nostrils daily as needed for allergies or rhinitis.   11  . lisinopril (PRINIVIL,ZESTRIL) 10 MG tablet Take 2 tablets (20 mg total) by mouth daily. (Patient taking differently: Take 10 mg by mouth 2 (two) times daily. ) 60 tablet 11  . meclizine (ANTIVERT) 25 MG tablet Take 12.5-25 mg by mouth 2 (two) times daily as needed for dizziness or nausea.     . metoprolol succinate (TOPROL-XL) 50 MG 24 hr tablet Take 50 mg by mouth at bedtime.     . Multiple Vitamins-Minerals (MULTIVITAMIN PO) Take 1 tablet by mouth daily.    Marland Kitchen. omeprazole (PRILOSEC) 40 MG capsule Take 40 mg by mouth daily.      . ranitidine (ZANTAC) 150 MG tablet Take 1 tablet (150  mg total) by mouth 2 (two) times daily. 60 tablet 0  . Vitamin D, Ergocalciferol, (DRISDOL) 50000 UNITS CAPS capsule Take 1 capsule by mouth once a week. Take on Tuesdays  0   No current facility-administered medications for this visit.    Allergies:   Dobutamine; Epinephrine; Darvocet; Excedrin extra strength; Clarithromycin; Doxycycline; Penicillins; and Prednisone   Past Medical History  Diagnosis Date  . Arrhythmia     palps  . Fatigue   . Dizziness   . Hypertension   . SVT (supraventricular tachycardia) (HCC)   . Mitral valve prolapse   . Headache(784.0)   . UTI (lower urinary tract infection)   . Anxiety   . COPD (chronic obstructive pulmonary disease) (HCC)   . Radiculopathy   . H/O: hysterectomy     Past Surgical History  Procedure Laterality Date  . Cardiac catheterization  12/95  . Rotator cuff repair    . Appendectomy    . Cholecystectomy    . Abdominal hysterectomy       Social History:  The patient  reports that she has been smoking.  She does not have any smokeless tobacco history on file. She reports that she does not drink alcohol.  Family History:  The patient's family history includes Asthma in her other; Heart Problems in her brother; Heart attack in her mother and other; Heart attack (age of onset: 67) in her father; Heart failure in her other; Osteoporosis in her other; Transient ischemic attack in her mother.    ROS:  Please see the history of present illness. All other systems are reviewed and  Negative to the above problem except as noted.    PHYSICAL EXAM: VS:  BP 144/88 mmHg  Pulse 62  Ht 5\' 3"  (1.6 m)  Wt 56.79 kg (125 lb 3.2 oz)  BMI 22.18 kg/m2  SpO2 96%  GEN: Well nourished, well developed, in no acute distress HEENT: normal Neck: no JVD, carotid bruits, or masses Cardiac: RRR; no murmurs, rubs, or gallops,no edema  Respiratory:  clear to auscultation bilaterally, normal work of breathing GI: soft, nontender, nondistended, + BS  No  hepatomegaly  MS: no deformity Moving all extremities   Skin: warm and dry, no rash Neuro:  Strength and sensation are intact Psych: euthymic mood, full affect   EKG:  EKG is not  ordered today.   Lipid Panel    Component Value Date/Time   CHOL 199 07/21/2013 1456   TRIG 104.0 07/21/2013 1456   HDL 63.60 07/21/2013 1456   CHOLHDL 3 07/21/2013 1456   VLDL 20.8 07/21/2013 1456   LDLCALC 115* 07/21/2013 1456      Wt Readings from Last 3 Encounters:  09/06/15 56.79 kg (125 lb 3.2 oz)  02/21/15 58.514 kg (129 lb)  02/15/15 58.514 kg (129 lb)      ASSESSMENT AND PLAN: 1  Dizziness  Improved with fluids  I have recomm taht she continue to keep up with fluid intake , increase salt intake    2  HTN  No change in meds     3  Palpitations  Keep on same regimen         Signed, Dietrich Pates, MD  09/06/2015 1:45 PM    [facility] [address] [place], [place], [place]  [address] Phone: [phone]; Fax: [phone]

## 2015-09-11 NOTE — Progress Notes (Signed)
Staff message sent to medical records for more information.

## 2015-10-23 ENCOUNTER — Telehealth: Payer: Self-pay | Admitting: Internal Medicine

## 2015-10-23 NOTE — Telephone Encounter (Signed)
Called stating she can't seem to get BP under control.  Sunday night was 202/ ; felt numb all over; lightheaded.  Yesterday BP was in the 190's.  She called her PCP, Dr. Arlyce Dice who started her on Norvasc 10 mg 1/2 tablet twice a day. She also is taking Lisinopril 10 mg BID; Metoprolol 25 mg BID.  This AM was 193/82; 2 hrs later 130/68; 2 hrs later 166/70.  States she is just scared she is going to have a stroke.  Advised that she should get back in touch with Dr. Arlyce Dice for BP control but she probably would want her to be on the added medication for several days.  Also advised not to take BP every two hours, take twice a day.  Also she has Xanax ordered and advised to try taking one of these pills to relax her.  She seems to be very anxious so tried to reassure her.  Advised that if Dr. Arlyce Dice wanted her to be seen by [place] then she can call us back but that Dr. Arlyce Dice can monitor her BP. She verbalizes understanding and seemed calmer after speaking with her.

## 2015-10-23 NOTE — Telephone Encounter (Signed)
New Message  Pt c/o BP issue:  1. What are your last 5 BP readings?  193/80 at 6 am  After taking medication it was 130/? Something thinking it was 68 After another 2 hours it was 163/71  2. Are you having any other symptoms (ex. Dizziness, headache, blurred vision, passed out)? Dizziness and headaches and she feels as if she will pass out. Pt is not sure how to get it stabilized

## 2016-02-27 ENCOUNTER — Ambulatory Visit (HOSPITAL_COMMUNITY)
Admission: EM | Admit: 2016-02-27 | Discharge: 2016-02-27 | Disposition: A | Discharge status: Home / Self Care | Payer: Medicare HMO

## 2016-03-04 NOTE — Progress Notes (Signed)
Cardiology Office Note:    Date:  03/04/2016   ID:  Barbara Hill, DOB 10/18/1940, MRN [id number]  PCP:  Lilia ArgueKAPLAN,KRISTEN, PA-C  Cardiologist:  Dr. Dietrich PatesPaula Ross   Electrophysiologist:  n/a  Referring MD: Richmond CampbellKaplan, Kristen W., PA-C   Chief Complaint  Patient presents with  . Follow-up    palpitations    History of Present Illness:     Barbara Hill is a 75 y.o. female with a hx of    Past Medical History  Diagnosis Date  . Arrhythmia     palps  . Fatigue   . Dizziness   . Hypertension   . SVT (supraventricular tachycardia) (HCC)   . Mitral valve prolapse   . Headache(784.0)   . UTI (lower urinary tract infection)   . Anxiety   . COPD (chronic obstructive pulmonary disease) (HCC)   . Radiculopathy   . H/O: hysterectomy     Past Surgical History  Procedure Laterality Date  . Cardiac catheterization  12/95  . Rotator cuff repair    . Appendectomy    . Cholecystectomy    . Abdominal hysterectomy      Current Medications: Outpatient Prescriptions Prior to Visit  Medication Sig Dispense Refill  . acetaminophen (TYLENOL) 325 MG tablet Take 650 mg by mouth every 6 (six) hours as needed for mild pain, moderate pain or headache.    . ALPRAZolam (XANAX) 0.5 MG tablet Take 0.5 mg by mouth 3 (three) times daily.     Marland Kitchen. amLODipine (NORVASC) 10 MG tablet Take 1 tablet (10 mg total) by mouth daily. 30 tablet 0  . butalbital-acetaminophen-caffeine (FIORICET, ESGIC) 50-325-40 MG per tablet Take 1 tablet by mouth every 4 (four) hours as needed for headache or migraine.   3  . flecainide (TAMBOCOR) 100 MG tablet Take 100 mg by mouth 2 (two) times daily.  1  . FLUoxetine (PROZAC) 40 MG capsule Take 40 mg by mouth daily.      . fluticasone (FLONASE) 50 MCG/ACT nasal spray Place 1 spray into both nostrils daily as needed for allergies or rhinitis.   11  . lisinopril (PRINIVIL,ZESTRIL) 10 MG tablet Take 2 tablets (20 mg total) by mouth daily. (Patient taking differently: Take 10 mg by  mouth 2 (two) times daily. ) 60 tablet 11  . meclizine (ANTIVERT) 25 MG tablet Take 12.5-25 mg by mouth 2 (two) times daily as needed for dizziness or nausea.     . metoprolol succinate (TOPROL-XL) 50 MG 24 hr tablet Take 50 mg by mouth at bedtime.     . Multiple Vitamins-Minerals (MULTIVITAMIN PO) Take 1 tablet by mouth daily.    Marland Kitchen. omeprazole (PRILOSEC) 40 MG capsule Take 40 mg by mouth daily.      . ranitidine (ZANTAC) 150 MG tablet Take 1 tablet (150 mg total) by mouth 2 (two) times daily. 60 tablet 0  . Vitamin D, Ergocalciferol, (DRISDOL) 50000 UNITS CAPS capsule Take 1 capsule by mouth once a week. Take on Tuesdays  0   No facility-administered medications prior to visit.      Allergies:   Dobutamine; Epinephrine; Darvocet; Excedrin extra strength; Clarithromycin; Doxycycline; Penicillins; and Prednisone   Social History   Social History  . Marital Status: Divorced    Spouse Name: N/A  . Number of Children: N/A  . Years of Education: N/A   Social History Main Topics  . Smoking status: Current Every Day Smoker  . Smokeless tobacco: Not on file  . Alcohol Use: No  .  Drug Use: Not on file  . Sexual Activity: Not on file   Other Topics Concern  . Not on file   Social History Narrative     Family History:  The patient's family history includes Asthma in her other; Heart Problems in her brother; Heart attack in her mother and other; Heart attack (age of onset: 19) in her father; Heart failure in her other; Osteoporosis in her other; Transient ischemic attack in her mother.   ROS:   Please see the history of present illness.    ROS All other systems reviewed and are negative.   Physical Exam:    VS:  There were no vitals taken for this visit.   Physical Exam  Wt Readings from Last 3 Encounters:  09/06/15 125 lb 3.2 oz (56.79 kg)  02/21/15 129 lb (58.514 kg)  02/15/15 129 lb (58.514 kg)      Studies/Labs Reviewed:     EKG:  EKG is  ordered today.  The ekg ordered  today demonstrates   Recent Labs: 09/06/2015: BUN 7; Creat 0.88; Potassium 3.6; Sodium 140   Recent Lipid Panel    Component Value Date/Time   CHOL 199 07/21/2013 1456   TRIG 104.0 07/21/2013 1456   HDL 63.60 07/21/2013 1456   CHOLHDL 3 07/21/2013 1456   VLDL 20.8 07/21/2013 1456   LDLCALC 115* 07/21/2013 1456    Additional studies/ records that were reviewed today include:    Myoview 10/12 Normal stress nuclear study.  LHC 10/02 Normal coronary arteries  ASSESSMENT:     No diagnosis found.  PLAN:     In order of problems listed above:  1.    Medication Adjustments/Labs and Tests Ordered: Current medicines are reviewed at length with the patient today.  Concerns regarding medicines are outlined above.  Medication changes, Labs and Tests ordered today are outlined in the Patient Instructions noted below. There are no Patient Instructions on file for this visit. Signed, Tereso Newcomer, PA-C  03/04/2016 4:55 PM    [facility] [address] [place], [place], [place]  [address] Phone: [phone]; Fax: [phone]     This encounter was created in error - please disregard.

## 2016-03-05 ENCOUNTER — Encounter: Payer: Self-pay | Admitting: Physician Assistant

## 2016-03-12 ENCOUNTER — Emergency Department (HOSPITAL_COMMUNITY)
Admission: EM | Admit: 2016-03-12 | Discharge: 2016-03-12 | Disposition: A | Discharge status: Home / Self Care | Payer: Medicare HMO | Attending: Dermatology | Admitting: Dermatology

## 2016-03-12 ENCOUNTER — Emergency Department (HOSPITAL_COMMUNITY): Payer: Medicare HMO

## 2016-03-12 ENCOUNTER — Encounter (HOSPITAL_COMMUNITY): Payer: Self-pay | Admitting: Emergency Medicine

## 2016-03-12 DIAGNOSIS — Z5321 Procedure and treatment not carried out due to patient leaving prior to being seen by health care provider: Secondary | ICD-10-CM | POA: Insufficient documentation

## 2016-03-12 DIAGNOSIS — M25511 Pain in right shoulder: Secondary | ICD-10-CM | POA: Diagnosis not present

## 2016-03-12 NOTE — ED Notes (Signed)
Pt. lost her balance and fell this evening at home , denies LOC /ambulatory , alert and oriented/respirations unlabored . Reports mild pain at right shoulder ,hypertensive at triage .

## 2016-03-12 NOTE — ED Notes (Signed)
Pt said that they needed to go home due to her and her husband not being able to drive well in the dark.

## 2016-03-30 NOTE — Progress Notes (Signed)
Cardiology Office Note:    Date:  03/31/2016   ID:  Barbara HazelBrenda M Gerwig, DOB 12/12/1940, MRN [id number]  PCP:  Lilia ArgueKAPLAN,KRISTEN, PA-C  Cardiologist:  Dr. Dietrich PatesPaula Ross   Electrophysiologist:  n/a  Referring MD: Richmond CampbellKaplan, Kristen W., PA-C   Chief Complaint  Patient presents with  . Follow-up  . Dizziness    History of Present Illness:    Barbara Hill is a 75 y.o. female with a hx of SVT, palpitations, dizziness and HTN. She has been on flecainide for years (started by previous cardiologist - Dr. Alanda AmassWeintraub). Last seen by Dr. Tenny Crawoss 1/17. She returns for follow-up. Over the past couple of months, she has had issues with dizziness. This seems to occur after standing. She also notes dyspnea with some activities. She continues to smoke. She denies chest pain. She denies orthopnea, PND or edema. She did fall on one occasion. She was dizzy prior to this. She denies frank syncope. Her palpitations are under control. As noted, she has been on flecainide for years. She has seen ENT. She was apparently told that she did not have vertigo. Brain MRI is pending.   Prior CV studies that were reviewed today include:    Myoview 10/12 Normal stress nuclear study.  LHC 10/02 Normal coronary arteries  Echo 11/01 EF 60%, mild anterior MVP with trivial MR, PASP 35-40 mmHg, LAE  Past Medical History:  Diagnosis Date  . Anxiety   . Arrhythmia    palps  . COPD (chronic obstructive pulmonary disease) (HCC)   . Dizziness   . Fatigue   . H/O: hysterectomy   . Headache(784.0)   . Hypertension   . Mitral valve prolapse   . Radiculopathy   . SVT (supraventricular tachycardia) (HCC)   . UTI (lower urinary tract infection)     Past Surgical History:  Procedure Laterality Date  . ABDOMINAL HYSTERECTOMY    . APPENDECTOMY    . CARDIAC CATHETERIZATION  12/95  . CHOLECYSTECTOMY    . ROTATOR CUFF REPAIR      Current Medications: Current Meds  Medication Sig  . acetaminophen (TYLENOL) 325 MG tablet Take 650  mg by mouth every 6 (six) hours as needed for mild pain, moderate pain or headache.  . ALPRAZolam (XANAX) 0.5 MG tablet Take 0.5 mg by mouth 3 (three) times daily.   Marland Kitchen. amLODipine (NORVASC) 10 MG tablet Take 1 tablet (10 mg total) by mouth daily.  . butalbital-acetaminophen-caffeine (FIORICET, ESGIC) 50-325-40 MG per tablet Take 1 tablet by mouth every 4 (four) hours as needed for headache or migraine.   . fexofenadine-pseudoephedrine (ALLEGRA-D) 60-120 MG 12 hr tablet Take 1 tablet by mouth daily.  . flecainide (TAMBOCOR) 100 MG tablet Take 1 tablet (100 mg total) by mouth 2 (two) times daily.  Marland Kitchen. FLUoxetine (PROZAC) 40 MG capsule Take 40 mg by mouth daily.    . fluticasone (FLONASE) 50 MCG/ACT nasal spray Place 1 spray into both nostrils daily as needed for allergies or rhinitis.   Marland Kitchen. lisinopril (PRINIVIL,ZESTRIL) 10 MG tablet Take 1 tablet (10 mg total) by mouth daily.  . meclizine (ANTIVERT) 25 MG tablet Take 12.5-25 mg by mouth 2 (two) times daily as needed for dizziness or nausea.   . metoprolol succinate (TOPROL-XL) 25 MG 24 hr tablet Take 1 tablet (25 mg total) by mouth at bedtime.  . Multiple Vitamins-Minerals (MULTIVITAMIN PO) Take 1 tablet by mouth daily.  Marland Kitchen. omeprazole (PRILOSEC) 40 MG capsule Take 40 mg by mouth daily.    .Marland Kitchen  ranitidine (ZANTAC) 150 MG tablet Take 1 tablet (150 mg total) by mouth 2 (two) times daily.  . Vitamin D, Ergocalciferol, (DRISDOL) 50000 UNITS CAPS capsule Take 1 capsule by mouth once a week. Take on Tuesdays  . [DISCONTINUED] flecainide (TAMBOCOR) 100 MG tablet Take 100 mg by mouth 2 (two) times daily.  . [DISCONTINUED] lisinopril (PRINIVIL,ZESTRIL) 10 MG tablet Take 10 mg by mouth 2 (two) times daily.  . [DISCONTINUED] metoprolol succinate (TOPROL-XL) 50 MG 24 hr tablet Take 50 mg by mouth at bedtime.        Allergies:   Dobutamine; Epinephrine; Darvocet [propoxyphene n-acetaminophen]; Excedrin extra strength [aspirin-acetaminophen-caffeine]; Propoxyphene;  Clarithromycin; Clarithromycin; Doxycycline; Penicillins; and Prednisone   Social History   Social History  . Marital status: Divorced    Spouse name: N/A  . Number of children: N/A  . Years of education: N/A   Social History Main Topics  . Smoking status: Current Every Day Smoker  . Smokeless tobacco: Current User  . Alcohol use No  . Drug use: Unknown  . Sexual activity: Not Asked   Other Topics Concern  . None   Social History Narrative  . None     Family History:  The patient's family history includes Asthma in her other; Heart Problems in her brother; Heart attack in her mother and other; Heart attack (age of onset: 69) in her father; Heart failure in her other; Osteoporosis in her other; Transient ischemic attack in her mother.   ROS:   Please see the history of present illness.    Review of Systems  Constitution: Positive for chills, malaise/fatigue and weight loss.  HENT: Positive for headaches.   Cardiovascular: Positive for irregular heartbeat.  Respiratory: Positive for cough.   Musculoskeletal: Positive for back pain.  Neurological: Positive for dizziness and loss of balance.  Psychiatric/Behavioral: Positive for depression.   All other systems reviewed and are negative.   EKGs/Labs/Other Test Reviewed:    EKG:  EKG is  ordered today.  The ekg ordered today demonstrates Sinus bradycardia, HR 55, LAD, IVCD, QRS 102 ms, QTC 470 ms, similar to prior tracings  Recent Labs: 09/06/2015: BUN 7; Creat 0.88; Potassium 3.6; Sodium 140   Recent Lipid Panel    Component Value Date/Time   CHOL 199 07/21/2013 1456   TRIG 104.0 07/21/2013 1456   HDL 63.60 07/21/2013 1456   CHOLHDL 3 07/21/2013 1456   VLDL 20.8 07/21/2013 1456   LDLCALC 115 (H) 07/21/2013 1456     Physical Exam:    VS:  BP 112/60   Pulse (!) 54   Ht 5\' 3"  (1.6 m)   Wt 117 lb 12.8 oz (53.4 kg)   BMI 20.87 kg/m     Orthostatic VS for the past 24 hrs (Last 3 readings):  BP- Lying Pulse-  Lying BP- Sitting Pulse- Sitting BP- Standing at 0 minutes Pulse- Standing at 0 minutes BP- Standing at 3 minutes Pulse- Standing at 3 minutes  03/31/16 1006 115/56 53 106/60 55 (!) 73/45 58 (!) 82/48 58    Wt Readings from Last 3 Encounters:  03/31/16 117 lb 12.8 oz (53.4 kg)  03/12/16 116 lb (52.6 kg)  09/06/15 125 lb 3.2 oz (56.8 kg)    Physical Exam  Constitutional: She is oriented to person, place, and time. She appears well-developed and well-nourished.  HENT:  Head: Normocephalic and atraumatic.  Eyes: No scleral icterus.  Neck: Normal range of motion. No JVD present.  Cardiovascular: Normal rate, regular rhythm, S1 normal and  S2 normal.  Exam reveals no gallop and no friction rub.   No murmur heard. Pulmonary/Chest: She has decreased breath sounds. She has no wheezes. She has no rhonchi. She has no rales.  Abdominal: Soft. There is no tenderness.  Musculoskeletal: She exhibits no edema.  Neurological: She is alert and oriented to person, place, and time.  Skin: Skin is warm and dry.  Psychiatric: She has a normal mood and affect.    ASSESSMENT:    1. Dizziness   2. Dyspnea   3. SVT (supraventricular tachycardia) (HCC)   4. Hypertension, essential   5. Orthostatic hypotension    PLAN:    In order of problems listed above:  1. Dizziness - She seems to describe orthostatic intolerance. She does describe a spinning sensation. However, ENT has told her that this is not vertigo. She does have a brain MRI pending. Her heart rate is somewhat low. Her blood pressure is somewhat low.  -  Check orthostatic vital signs today >> significant BP drop with standing (adjust medications as noted)  -  Decrease metoprolol succinate 25 mg daily  -  Decrease lisinopril to 10 mg daily  -  Obtain BMET, CBC, TSH  -  Consider reducing dose of flecainide if dizziness continues.  2. Dyspnea - This is likely related to ongoing tobacco abuse. She denies chest discomfort. ECG is unchanged. She  does have a history of mitral valve prolapse. She does not appear volume overloaded on exam. She had a normal cardiac catheterization in 2002 and low risk Myoview in 2012. She is certainly at risk for CAD given ongoing tobacco abuse.  -  Obtain echocardiogram  -  Check BMET, BNP  -  Adjust medications as noted  -  If dyspnea continues, consider nuclear stress test  3. SVT - Symptoms controlled on current therapy. She has been on this dose of flecainide for many years in combination with her other medications.  4. HTN - As noted blood pressure somewhat low. Adjust medications as outlined. Check labs as outlined.  5. Orthostatic hypotension - Reduce medications as noted above.  I have asked her to wear compression stockings and to be mindful of standing slowly to avoid rapid drop in her BP.  Check BMET, CBC today.     Medication Adjustments/Labs and Tests Ordered: Current medicines are reviewed at length with the patient today.  Concerns regarding medicines are outlined above.  Medication changes, Labs and Tests ordered today are outlined in the Patient Instructions noted below. Patient Instructions  Medication Instructions:  1. DECREASE TOPROL TO 25 MG DAILY 2. DECREASE LISINOPRIL TO 10 MG DAILY Labwork: 1. TODAY BMET, CBC, TSH, BNP Testing/Procedures: Your physician has requested that you have an echocardiogram. Echocardiography is a painless test that uses sound waves to create images of your heart. It provides your doctor with information about the size and shape of your heart and how well your heart's chambers and valves are working. This procedure takes approximately one hour. There are no restrictions for this procedure. Follow-Up: WITH Maebell Lyvers, [facility] ON 05/11/16 @ 9:45  Any Other Special Instructions Will Be Listed Below (If Applicable). 1. CHECK BLOOD PRESSURE DAILY FOR 2 WEEKS; AFTER 2 WEEKS PLEASE CALL OR SEND READINGS TO [place], PAC [phone] 2. PICK UP SOME OVER THE  COUNTER COMPRESSION STOCKINGS; TRY TO WEAR FROM THE TIME YOU WAKE UP UNTIL THE TIME YOU GO TO BED If you need a refill on your cardiac medications before your next appointment,  please call your pharmacy.  Signed, Tereso Newcomer, PA-C  03/31/2016 1:00 PM    [facility] [address] [place], [place], [place]  [address] Phone: [phone]; Fax: [phone]

## 2016-03-31 ENCOUNTER — Encounter: Payer: Self-pay | Admitting: Physician Assistant

## 2016-03-31 ENCOUNTER — Encounter (INDEPENDENT_AMBULATORY_CARE_PROVIDER_SITE_OTHER): Payer: Self-pay

## 2016-03-31 ENCOUNTER — Other Ambulatory Visit: Payer: Self-pay | Admitting: Otolaryngology

## 2016-03-31 ENCOUNTER — Telehealth: Payer: Self-pay | Admitting: *Deleted

## 2016-03-31 ENCOUNTER — Ambulatory Visit (INDEPENDENT_AMBULATORY_CARE_PROVIDER_SITE_OTHER): Payer: Medicare HMO | Admitting: Physician Assistant

## 2016-03-31 ENCOUNTER — Other Ambulatory Visit: Payer: Self-pay | Admitting: Physician Assistant

## 2016-03-31 VITALS — BP 112/60 | HR 54 | Ht 63.0 in | Wt 117.8 lb

## 2016-03-31 DIAGNOSIS — R0600 Dyspnea, unspecified: Secondary | ICD-10-CM | POA: Diagnosis not present

## 2016-03-31 DIAGNOSIS — I471 Supraventricular tachycardia: Secondary | ICD-10-CM

## 2016-03-31 DIAGNOSIS — R42 Dizziness and giddiness: Secondary | ICD-10-CM

## 2016-03-31 DIAGNOSIS — I10 Essential (primary) hypertension: Secondary | ICD-10-CM

## 2016-03-31 DIAGNOSIS — I951 Orthostatic hypotension: Secondary | ICD-10-CM

## 2016-03-31 LAB — CBC WITH DIFFERENTIAL/PLATELET
BASOS PCT: 0 %
Basophils Absolute: 0 cells/uL (ref 0–200)
Eosinophils Absolute: 372 cells/uL (ref 15–500)
Eosinophils Relative: 4 %
HEMATOCRIT: 36.8 % (ref 35.0–45.0)
HEMOGLOBIN: 12.2 g/dL (ref 11.7–15.5)
LYMPHS ABS: 3348 {cells}/uL (ref 850–3900)
Lymphocytes Relative: 36 %
MCH: 30.2 pg (ref 27.0–33.0)
MCHC: 33.2 g/dL (ref 32.0–36.0)
MCV: 91.1 fL (ref 80.0–100.0)
MONO ABS: 837 {cells}/uL (ref 200–950)
MPV: 9.3 fL (ref 7.5–12.5)
Monocytes Relative: 9 %
NEUTROS PCT: 51 %
Neutro Abs: 4743 cells/uL (ref 1500–7800)
Platelets: 362 10*3/uL (ref 140–400)
RBC: 4.04 MIL/uL (ref 3.80–5.10)
RDW: 13.2 % (ref 11.0–15.0)
WBC: 9.3 10*3/uL (ref 3.8–10.8)

## 2016-03-31 LAB — BASIC METABOLIC PANEL
BUN: 10 mg/dL (ref 7–25)
CO2: 28 mmol/L (ref 20–31)
Calcium: 9.1 mg/dL (ref 8.6–10.4)
Chloride: 101 mmol/L (ref 98–110)
Creat: 0.96 mg/dL — ABNORMAL HIGH (ref 0.60–0.93)
GLUCOSE: 93 mg/dL (ref 65–99)
POTASSIUM: 4.2 mmol/L (ref 3.5–5.3)
SODIUM: 136 mmol/L (ref 135–146)

## 2016-03-31 LAB — BRAIN NATRIURETIC PEPTIDE: Brain Natriuretic Peptide: 439.9 pg/mL — ABNORMAL HIGH (ref <100)

## 2016-03-31 LAB — TSH: TSH: 1.07 mIU/L

## 2016-03-31 MED ORDER — FLECAINIDE ACETATE 100 MG PO TABS: 100.0000 mg | ORAL_TABLET | Freq: Two times a day (BID) | ORAL | 11 refills | Status: DC

## 2016-03-31 MED ORDER — METOPROLOL SUCCINATE ER 25 MG PO TB24: 25.0000 mg | ORAL_TABLET | Freq: Every day | ORAL | 11 refills | Status: DC

## 2016-03-31 MED ORDER — LISINOPRIL 10 MG PO TABS: 10.0000 mg | ORAL_TABLET | Freq: Every day | ORAL | 11 refills | Status: DC

## 2016-03-31 NOTE — Patient Instructions (Addendum)
Medication Instructions:  1. DECREASE TOPROL TO 25 MG DAILY 2. DECREASE LISINOPRIL TO 10 MG DAILY Labwork: 1. TODAY BMET, CBC, TSH, BNP Testing/Procedures: Your physician has requested that you have an echocardiogram. Echocardiography is a painless test that uses sound waves to create images of your heart. It provides your doctor with information about the size and shape of your heart and how well your heart's chambers and valves are working. This procedure takes approximately one hour. There are no restrictions for this procedure. Follow-Up: WITH SCOTT WEAVER, [facility] ON 05/11/16 @ 9:45  Any Other Special Instructions Will Be Listed Below (If Applicable). 1. CHECK BLOOD PRESSURE DAILY FOR 2 WEEKS; AFTER 2 WEEKS PLEASE CALL OR SEND READINGS TO [facility], [facility] [phone] 2. PICK UP SOME OVER THE COUNTER COMPRESSION STOCKINGS; TRY TO WEAR FROM THE TIME YOU WAKE UP UNTIL THE TIME YOU GO TO BED If you need a refill on your cardiac medications before your next appointment, please call your pharmacy.

## 2016-03-31 NOTE — Telephone Encounter (Signed)
Pt notified of lab results and findings by phone with verbal understanding. Pt agreeable to weigh daily and call office [phone] if wt up 3 lb's x 1 day or breath becomes worse.

## 2016-04-09 ENCOUNTER — Emergency Department (HOSPITAL_COMMUNITY)
Admission: EM | Admit: 2016-04-09 | Discharge: 2016-04-09 | Disposition: A | Discharge status: Home / Self Care | Payer: Medicare HMO | Attending: Dermatology | Admitting: Dermatology

## 2016-04-09 ENCOUNTER — Encounter (HOSPITAL_COMMUNITY): Payer: Self-pay | Admitting: *Deleted

## 2016-04-09 ENCOUNTER — Emergency Department (HOSPITAL_COMMUNITY): Payer: Medicare HMO

## 2016-04-09 DIAGNOSIS — Y939 Activity, unspecified: Secondary | ICD-10-CM | POA: Insufficient documentation

## 2016-04-09 DIAGNOSIS — Y999 Unspecified external cause status: Secondary | ICD-10-CM | POA: Insufficient documentation

## 2016-04-09 DIAGNOSIS — Z5321 Procedure and treatment not carried out due to patient leaving prior to being seen by health care provider: Secondary | ICD-10-CM | POA: Diagnosis not present

## 2016-04-09 DIAGNOSIS — M25552 Pain in left hip: Secondary | ICD-10-CM | POA: Diagnosis not present

## 2016-04-09 DIAGNOSIS — M25551 Pain in right hip: Secondary | ICD-10-CM | POA: Diagnosis present

## 2016-04-09 DIAGNOSIS — Y929 Unspecified place or not applicable: Secondary | ICD-10-CM | POA: Diagnosis not present

## 2016-04-09 DIAGNOSIS — W19XXXA Unspecified fall, initial encounter: Secondary | ICD-10-CM | POA: Insufficient documentation

## 2016-04-09 NOTE — ED Triage Notes (Signed)
Pt reports falling one week ago and still having bilateral hip pain and difficulty ambulating.

## 2016-04-10 ENCOUNTER — Emergency Department (HOSPITAL_COMMUNITY): Payer: Medicare HMO

## 2016-04-10 ENCOUNTER — Observation Stay (HOSPITAL_COMMUNITY): Payer: Medicare HMO

## 2016-04-10 ENCOUNTER — Observation Stay (HOSPITAL_COMMUNITY)
Admission: EM | Admit: 2016-04-10 | Discharge: 2016-04-11 | Disposition: A | Discharge status: Home / Self Care | Payer: Medicare HMO | Attending: Oncology | Admitting: Oncology

## 2016-04-10 ENCOUNTER — Encounter (HOSPITAL_COMMUNITY): Payer: Self-pay

## 2016-04-10 DIAGNOSIS — R296 Repeated falls: Secondary | ICD-10-CM | POA: Insufficient documentation

## 2016-04-10 DIAGNOSIS — R269 Unspecified abnormalities of gait and mobility: Secondary | ICD-10-CM | POA: Insufficient documentation

## 2016-04-10 DIAGNOSIS — I440 Atrioventricular block, first degree: Secondary | ICD-10-CM

## 2016-04-10 DIAGNOSIS — F419 Anxiety disorder, unspecified: Secondary | ICD-10-CM | POA: Insufficient documentation

## 2016-04-10 DIAGNOSIS — Z880 Allergy status to penicillin: Secondary | ICD-10-CM | POA: Insufficient documentation

## 2016-04-10 DIAGNOSIS — Z66 Do not resuscitate: Secondary | ICD-10-CM | POA: Insufficient documentation

## 2016-04-10 DIAGNOSIS — W19XXXA Unspecified fall, initial encounter: Secondary | ICD-10-CM | POA: Diagnosis not present

## 2016-04-10 DIAGNOSIS — S300XXA Contusion of lower back and pelvis, initial encounter: Secondary | ICD-10-CM | POA: Diagnosis not present

## 2016-04-10 DIAGNOSIS — Z90710 Acquired absence of both cervix and uterus: Secondary | ICD-10-CM | POA: Insufficient documentation

## 2016-04-10 DIAGNOSIS — E876 Hypokalemia: Secondary | ICD-10-CM | POA: Diagnosis not present

## 2016-04-10 DIAGNOSIS — I10 Essential (primary) hypertension: Secondary | ICD-10-CM | POA: Diagnosis not present

## 2016-04-10 DIAGNOSIS — Z8249 Family history of ischemic heart disease and other diseases of the circulatory system: Secondary | ICD-10-CM | POA: Insufficient documentation

## 2016-04-10 DIAGNOSIS — N179 Acute kidney failure, unspecified: Secondary | ICD-10-CM | POA: Diagnosis not present

## 2016-04-10 DIAGNOSIS — I058 Other rheumatic mitral valve diseases: Secondary | ICD-10-CM | POA: Diagnosis not present

## 2016-04-10 DIAGNOSIS — M25552 Pain in left hip: Secondary | ICD-10-CM | POA: Insufficient documentation

## 2016-04-10 DIAGNOSIS — H9209 Otalgia, unspecified ear: Secondary | ICD-10-CM

## 2016-04-10 DIAGNOSIS — J449 Chronic obstructive pulmonary disease, unspecified: Secondary | ICD-10-CM | POA: Diagnosis not present

## 2016-04-10 DIAGNOSIS — I951 Orthostatic hypotension: Principal | ICD-10-CM

## 2016-04-10 DIAGNOSIS — R42 Dizziness and giddiness: Secondary | ICD-10-CM | POA: Diagnosis not present

## 2016-04-10 DIAGNOSIS — R55 Syncope and collapse: Secondary | ICD-10-CM | POA: Diagnosis present

## 2016-04-10 DIAGNOSIS — R51 Headache: Secondary | ICD-10-CM | POA: Diagnosis not present

## 2016-04-10 DIAGNOSIS — F17210 Nicotine dependence, cigarettes, uncomplicated: Secondary | ICD-10-CM | POA: Insufficient documentation

## 2016-04-10 DIAGNOSIS — M25551 Pain in right hip: Secondary | ICD-10-CM | POA: Insufficient documentation

## 2016-04-10 DIAGNOSIS — M5136 Other intervertebral disc degeneration, lumbar region: Secondary | ICD-10-CM | POA: Diagnosis not present

## 2016-04-10 DIAGNOSIS — B9620 Unspecified Escherichia coli [E. coli] as the cause of diseases classified elsewhere: Secondary | ICD-10-CM | POA: Diagnosis not present

## 2016-04-10 LAB — URINALYSIS, ROUTINE W REFLEX MICROSCOPIC
BILIRUBIN URINE: NEGATIVE
GLUCOSE, UA: NEGATIVE mg/dL
KETONES UR: NEGATIVE mg/dL
NITRITE: NEGATIVE
PROTEIN: NEGATIVE mg/dL
Specific Gravity, Urine: 1.015 (ref 1.005–1.030)
pH: 5.5 (ref 5.0–8.0)

## 2016-04-10 LAB — COMPREHENSIVE METABOLIC PANEL
ALK PHOS: 83 U/L (ref 38–126)
ALT: 7 U/L — ABNORMAL LOW (ref 14–54)
ANION GAP: 5 (ref 5–15)
AST: 15 U/L (ref 15–41)
Albumin: 3.4 g/dL — ABNORMAL LOW (ref 3.5–5.0)
BILIRUBIN TOTAL: 0.4 mg/dL (ref 0.3–1.2)
BUN: 7 mg/dL (ref 6–20)
CALCIUM: 9.3 mg/dL (ref 8.9–10.3)
CO2: 29 mmol/L (ref 22–32)
Chloride: 102 mmol/L (ref 101–111)
Creatinine, Ser: 1.02 mg/dL — ABNORMAL HIGH (ref 0.44–1.00)
GFR calc Af Amer: >60 mL/min (ref >60)
GFR, EST NON AFRICAN AMERICAN: 53 mL/min — AB (ref >60)
Glucose, Bld: 98 mg/dL (ref 65–99)
POTASSIUM: 3.4 mmol/L — AB (ref 3.5–5.1)
Sodium: 136 mmol/L (ref 135–145)
TOTAL PROTEIN: 7.2 g/dL (ref 6.5–8.1)

## 2016-04-10 LAB — CBC WITH DIFFERENTIAL/PLATELET
Basophils Absolute: 0 10*3/uL (ref 0.0–0.1)
Basophils Relative: 0 %
Eosinophils Absolute: 0.2 10*3/uL (ref 0.0–0.7)
Eosinophils Relative: 3 %
HEMATOCRIT: 36 % (ref 36.0–46.0)
Hemoglobin: 11.8 g/dL — ABNORMAL LOW (ref 12.0–15.0)
LYMPHS ABS: 2.5 10*3/uL (ref 0.7–4.0)
LYMPHS PCT: 32 %
MCH: 30.6 pg (ref 26.0–34.0)
MCHC: 32.8 g/dL (ref 30.0–36.0)
MCV: 93.5 fL (ref 78.0–100.0)
MONO ABS: 0.5 10*3/uL (ref 0.1–1.0)
MONOS PCT: 6 %
NEUTROS ABS: 4.6 10*3/uL (ref 1.7–7.7)
Neutrophils Relative %: 59 %
Platelets: 358 10*3/uL (ref 150–400)
RBC: 3.85 MIL/uL — ABNORMAL LOW (ref 3.87–5.11)
RDW: 12.9 % (ref 11.5–15.5)
WBC: 7.8 10*3/uL (ref 4.0–10.5)

## 2016-04-10 LAB — URINE MICROSCOPIC-ADD ON

## 2016-04-10 LAB — TROPONIN I

## 2016-04-10 MED ORDER — ACETAMINOPHEN 325 MG PO TABS
650.0000 mg | ORAL_TABLET | Freq: Four times a day (QID) | ORAL | Status: DC | PRN
  Administered 2016-04-10 – 2016-04-11 (×3): 650 mg via ORAL
  Filled 2016-04-10 (×3): qty 2

## 2016-04-10 MED ORDER — FLECAINIDE ACETATE 50 MG PO TABS
50.0000 mg | ORAL_TABLET | Freq: Two times a day (BID) | ORAL | Status: DC
  Administered 2016-04-10 – 2016-04-11 (×2): 50 mg via ORAL
  Filled 2016-04-10 (×3): qty 1

## 2016-04-10 MED ORDER — PANTOPRAZOLE SODIUM 40 MG PO TBEC
40.0000 mg | DELAYED_RELEASE_TABLET | Freq: Every day | ORAL | Status: DC
  Administered 2016-04-10 – 2016-04-11 (×2): 40 mg via ORAL
  Filled 2016-04-10 (×2): qty 1

## 2016-04-10 MED ORDER — ENOXAPARIN SODIUM 40 MG/0.4ML ~~LOC~~ SOLN
40.0000 mg | SUBCUTANEOUS | Status: DC
  Administered 2016-04-10: 40 mg via SUBCUTANEOUS
  Filled 2016-04-10: qty 0.4

## 2016-04-10 MED ORDER — FLUTICASONE PROPIONATE 50 MCG/ACT NA SUSP
1.0000 | Freq: Every day | NASAL | Status: DC | PRN
  Filled 2016-04-10: qty 16

## 2016-04-10 MED ORDER — GADOBENATE DIMEGLUMINE 529 MG/ML IV SOLN
10.0000 mL | Freq: Once | INTRAVENOUS | Status: AC
  Administered 2016-04-10: 10 mL via INTRAVENOUS

## 2016-04-10 MED ORDER — SODIUM CHLORIDE 0.9 % IV BOLUS (SEPSIS)
1000.0000 mL | Freq: Once | INTRAVENOUS | Status: AC
  Administered 2016-04-10: 1000 mL via INTRAVENOUS

## 2016-04-10 MED ORDER — SODIUM CHLORIDE 0.9 % IV SOLN: INTRAVENOUS | Status: DC

## 2016-04-10 MED ORDER — SENNOSIDES-DOCUSATE SODIUM 8.6-50 MG PO TABS: 1.0000 | ORAL_TABLET | Freq: Every evening | ORAL | Status: DC | PRN

## 2016-04-10 MED ORDER — SODIUM CHLORIDE 0.9% FLUSH
3.0000 mL | Freq: Two times a day (BID) | INTRAVENOUS | Status: DC
  Administered 2016-04-10 (×2): 3 mL via INTRAVENOUS

## 2016-04-10 MED ORDER — HYDROCODONE-ACETAMINOPHEN 5-325 MG PO TABS
1.0000 | ORAL_TABLET | Freq: Once | ORAL | Status: AC
  Administered 2016-04-10: 1 via ORAL
  Filled 2016-04-10: qty 1

## 2016-04-10 MED ORDER — POTASSIUM CHLORIDE CRYS ER 20 MEQ PO TBCR
20.0000 meq | EXTENDED_RELEASE_TABLET | Freq: Once | ORAL | Status: AC
  Administered 2016-04-10: 20 meq via ORAL
  Filled 2016-04-10: qty 1

## 2016-04-10 MED ORDER — LORATADINE 10 MG PO TABS
10.0000 mg | ORAL_TABLET | Freq: Every day | ORAL | Status: DC
  Administered 2016-04-10 – 2016-04-11 (×2): 10 mg via ORAL
  Filled 2016-04-10 (×2): qty 1

## 2016-04-10 MED ORDER — SODIUM CHLORIDE 0.9 % IV SOLN
INTRAVENOUS | Status: AC
  Administered 2016-04-10: 18:00:00 via INTRAVENOUS

## 2016-04-10 MED ORDER — ACETAMINOPHEN 650 MG RE SUPP: 650.0000 mg | Freq: Four times a day (QID) | RECTAL | Status: DC | PRN

## 2016-04-10 MED ORDER — ALPRAZOLAM 0.25 MG PO TABS
0.5000 mg | ORAL_TABLET | Freq: Three times a day (TID) | ORAL | Status: DC | PRN
  Administered 2016-04-11: 0.5 mg via ORAL
  Filled 2016-04-10: qty 2

## 2016-04-10 NOTE — H&P (Signed)
Date: 04/10/2016               Patient Name:  Barbara Hill MRN: [id number]  DOB: 1941/03/29 Age / Sex: 75 y.o., female   PCP: Richmond Campbell, PA-C         Medical Service: Internal Medicine Teaching Service         Attending Physician: Dr. Levert Feinstein, MD    First Contact: Dr. Eulah Pont  Pager: [phone]  Second Contact: Dr. Heywood Iles   Pager: [phone]       After Hours (After 5p/  First Contact Pager: [phone]  weekends / holidays): Second Contact Pager: [phone]   Chief Complaint: dizzy spells   History of Present Illness: Barbara Hill is a 75 y.o. female with a PMH of supraventricular tachycardia, COPD, HTN, Mitral valve prolapse, anxiety who presents with dizzy spells. Over the past 2 months Barbara Hill has been experiencing dizzy spells when she stands up too quickly. The most recent was last Thursday when she was sleeping on the couch, she stood up quickly and the next thing she remembers is feeling her head hit the floor. The next morning she woke up with a headache and pain right over the area where she had hit her head, she also had pain in her buttocks and back to the point where she didn't want to get out of bed. Since that point she has continued to get dizzy spells when she bends over and then straightens back up. She has low fluid intake- drinks sips of liquids throughout the day. She has lost weight, she states she weighed 132 lbs at the beginning of the summer and weighs 114 lbs now. She denies blurry vision, ringing in her ears, weakness, changes in urination, constipation or diarrhea, and chest pain.   Meds:  Prescriptions Prior to Admission  Medication Sig Dispense Refill Last Dose  . acetaminophen (TYLENOL) 325 MG tablet Take 650 mg by mouth every 6 (six) hours as needed for mild pain, moderate pain or headache.   Past Week at Unknown time  . acidophilus (RISAQUAD) CAPS capsule Take 1 capsule by mouth daily.   04/09/2016 at Unknown time  . ALPRAZolam  (XANAX) 0.5 MG tablet Take 0.5 mg by mouth 3 (three) times daily.    04/10/2016 at Unknown time  . amLODipine (NORVASC) 10 MG tablet Take 1 tablet (10 mg total) by mouth daily. (Patient taking differently: Take 5 mg by mouth daily. ) 30 tablet 0 04/09/2016 at Unknown time  . butalbital-acetaminophen-caffeine (FIORICET, ESGIC) 50-325-40 MG per tablet Take 1 tablet by mouth every 4 (four) hours as needed for headache or migraine.   3 04/10/2016 at Unknown time  . fexofenadine-pseudoephedrine (ALLEGRA-D) 60-120 MG 12 hr tablet Take 1 tablet by mouth daily.   04/09/2016 at Unknown time  . flecainide (TAMBOCOR) 100 MG tablet Take 1 tablet (100 mg total) by mouth 2 (two) times daily. 60 tablet 11 04/10/2016 at Unknown time  . FLUoxetine (PROZAC) 40 MG capsule Take 20 mg by mouth 2 (two) times daily.    04/10/2016 at Unknown time  . fluticasone (FLONASE) 50 MCG/ACT nasal spray Place 1 spray into both nostrils daily as needed for allergies or rhinitis.   11 Past Month at Unknown time  . lisinopril (PRINIVIL,ZESTRIL) 10 MG tablet Take 1 tablet (10 mg total) by mouth daily. 30 tablet 11 04/10/2016 at Unknown time  . meclizine (ANTIVERT) 25 MG tablet Take 12.5-25 mg by  mouth 2 (two) times daily as needed for dizziness or nausea.    Past Week at Unknown time  . metoprolol succinate (TOPROL-XL) 25 MG 24 hr tablet TAKE 1 TABLET BY MOUTH EVERY NIGHT AT BEDTIME 90 tablet 3 04/09/2016 at 8a  . Multiple Vitamins-Minerals (MULTIVITAMIN PO) Take 1 tablet by mouth daily.   Past Month at Unknown time  . omeprazole (PRILOSEC) 40 MG capsule Take 40 mg by mouth daily.     04/09/2016 at Unknown time  . ranitidine (ZANTAC) 150 MG tablet Take 1 tablet (150 mg total) by mouth 2 (two) times daily. 60 tablet 0 04/09/2016 at Unknown time  . Vitamin D, Ergocalciferol, (DRISDOL) 50000 UNITS CAPS capsule Take 1 capsule by mouth once a week. Take on Tuesdays  0 Past Week at Unknown time   Allergies: Allergies as of 04/10/2016 - Review Complete  04/10/2016  Allergen Reaction Noted  . Dobutamine Other (See Comments) 05/13/2011  . Epinephrine Other (See Comments) 04/20/2011  . Darvocet [propoxyphene n-acetaminophen] Nausea And Vomiting 04/20/2011  . Excedrin extra strength [aspirin-acetaminophen-caffeine] Nausea And Vomiting 04/09/2014  . Propoxyphene Nausea And Vomiting 04/20/2011  . Clarithromycin Other (See Comments) 04/20/2011  . Clarithromycin Other (See Comments) 04/20/2011  . Doxycycline Other (See Comments) 04/20/2011  . Penicillins Other (See Comments) 04/20/2011  . Prednisone Other (See Comments) 04/20/2011   Past Medical History:  Diagnosis Date  . Anxiety   . Arrhythmia    palps  . COPD (chronic obstructive pulmonary disease) (HCC)   . Dizziness   . Fatigue   . H/O: hysterectomy   . Headache(784.0)   . Hypertension   . Mitral valve prolapse   . Radiculopathy   . SVT (supraventricular tachycardia) (HCC)   . UTI (lower urinary tract infection)    Family History:  Mother- passed from an MI at 56 yo  Father- passed in an accident  Brother - TIA leading to permanent aphasia  Brother- Cancer Sister- multiple sclerosis   Social History:  She has smoked 1 pack of cigarettes per day for 50 years. Denies alcohol or illegal drug use.   Review of Systems: A complete ROS was negative except as per HPI.   Physical Exam: Vitals:   04/10/16 1500 04/10/16 1600 04/10/16 1736 04/10/16 2040  BP: 134/60 155/69 127/75 (!) 137/59  Pulse: 69 64 70 63  Resp: 18 17 16 17   Temp:   97.8 F (36.6 C) 97.5 F (36.4 C)  TempSrc:   Oral Oral  SpO2: 96% 97% 96% 96%  Weight:   118 lb 1.6 oz (53.6 kg)   Height:   5\' 3"  (1.6 m)    Physical Exam  Constitutional: She appears well-developed and well-nourished. No distress.  HENT:  Mouth/Throat: Mucous membranes are not pale and not dry.  Eyes: Conjunctivae and EOM are normal. Pupils are equal, round, and reactive to light.  Cardiovascular: Normal rate and regular rhythm.   No  murmur heard. Pulmonary/Chest: No respiratory distress. She has no wheezes.  Abdominal: Soft. She exhibits no distension. There is no tenderness. There is no guarding.  Musculoskeletal:  Paraspinal tenderness and muscle spasm over lumbar and sacral spine.   Neurological: She is alert. She has normal strength. No cranial nerve deficit or sensory deficit.  Reflex Scores:      Patellar reflexes are 2+ on the right side and 2+ on the left side. Skin:  Tenting of skin   Extremities: no peripheral edema, no calf pain   Labs: CBC:  Recent Labs  Lab 04/10/16 1030  WBC 7.8  NEUTROABS 4.6  HGB 11.8*  HCT 36.0  MCV 93.5  PLT 358   Basic Metabolic Panel:  Recent Labs Lab 04/10/16 1030  NA 136  K 3.4*  CL 102  CO2 29  GLUCOSE 98  BUN 7  CREATININE 1.02*  CALCIUM 9.3   Cardiac Enzymes:  Recent Labs Lab 04/10/16 1030  TROPONINI <0.03   Liver Function Tests:  Recent Labs Lab 04/10/16 1030  AST 15  ALT 7*  ALKPHOS 83  BILITOT 0.4  PROT 7.2  ALBUMIN 3.4*   Urine Studies: Urinalysis    Component Value Date/Time   COLORURINE YELLOW 04/10/2016 1200   APPEARANCEUR CLEAR 04/10/2016 1200   APPEARANCEUR Clear 09/08/2012 0030   LABSPEC 1.015 04/10/2016 1200   LABSPEC 1.013 09/08/2012 0030   PHURINE 5.5 04/10/2016 1200   GLUCOSEU NEGATIVE 04/10/2016 1200   GLUCOSEU Negative 09/08/2012 0030   HGBUR TRACE (A) 04/10/2016 1200   BILIRUBINUR NEGATIVE 04/10/2016 1200   BILIRUBINUR Negative 09/08/2012 0030   KETONESUR NEGATIVE 04/10/2016 1200   PROTEINUR NEGATIVE 04/10/2016 1200   UROBILINOGEN 0.2 01/18/2015 2006   NITRITE NEGATIVE 04/10/2016 1200   LEUKOCYTESUR SMALL (A) 04/10/2016 1200   LEUKOCYTESUR Negative 09/08/2012 0030  Bacteria- FEW  WBC 6-30  EKG: EKG: Normal sinus rhythm, 1st degree block, L axis deviation    Imaging: Dg Lumbar Spine Complete  Result Date: 04/10/2016 CLINICAL DATA:  Post fall last week with persistent generalized low back pain. EXAM:  LUMBAR SPINE - COMPLETE 4+ VIEW COMPARISON:  CT abdomen pelvis - 02/15/2015 FINDINGS: There are 5 non rib-bearing lumbar type vertebral bodies. There is mild straightening of the expected lumbar lordosis. No anterolisthesis or retrolisthesis. No definite pars defects. Lumbar vertebral body heights are preserved. Mild to moderate multilevel lumbar spine DDD, worse at L2-L3 with disc space height loss, endplate irregularity and sclerosis, similar to the 02/15/2015 examination. Limited visualization of the bilateral SI joints is normal. Calcified atherosclerotic plaque within the abdominal aorta. Post cholecystectomy. Regional bowel gas pattern is normal. IMPRESSION: 1. Mild straightening of the expected lumbar lordosis, nonspecific though could be seen in the setting of muscle spasm. Otherwise, no acute findings. 2. Mild-to-moderate multilevel lumbar spine DDD, worse at L2-L3, similar to the 01/2015 examination. 3.  Aortic Atherosclerosis (ICD10-170.0) Electronically Signed   By: Simonne Come M.D.   On: 04/10/2016 11:21   Ct Head Wo Contrast  Result Date: 04/10/2016 CLINICAL DATA:  Continued dizziness and multiple falls for the past 2 months. History hypertension. EXAM: CT HEAD WITHOUT CONTRAST TECHNIQUE: Contiguous axial images were obtained from the base of the skull through the vertex without intravenous contrast. COMPARISON:  None. FINDINGS: Brain: Mild, likely age-appropriate atrophy with sulcal prominence. Scattered minimal periventricular hypodensities compatible microvascular ischemic disease. The gray-white differentiation is otherwise well maintained without CT evidence of acute large territory infarct. No intraparenchymal or extra-axial mass or hemorrhage. Normal size a configuration of the ventricles and basilar cisterns. No midline shift. Vascular: Intracranial atherosclerosis Skull: No displaced calvarial fracture. Sinuses/Orbits: Limited visualization the paranasal sinuses and mastoid air cells is  normal. No air-fluid levels. Other: Regional soft tissues appear normal. IMPRESSION: Mild, likely age-appropriate atrophy and microvascular ischemic disease without acute intracranial process. Electronically Signed   By: Simonne Come M.D.   On: 04/10/2016 11:19   Mr Laqueta Jean ZO Contrast  Result Date: 04/10/2016 CLINICAL DATA:  Dizziness and multiple falls. Symptoms for 2 months. Otalgia. EXAM: MRI HEAD WITHOUT AND WITH CONTRAST TECHNIQUE:  Multiplanar, multiecho pulse sequences of the brain and surrounding structures were obtained without and with intravenous contrast. CONTRAST:  10mL MULTIHANCE GADOBENATE DIMEGLUMINE 529 MG/ML IV SOLN COMPARISON:  04/10/2016. FINDINGS: No evidence for acute infarction, hemorrhage, mass lesion, hydrocephalus, or extra-axial fluid. Moderate cerebral and cerebellar atrophy. Mild to moderate subcortical and periventricular T2 and FLAIR hyperintensities, likely chronic microvascular ischemic change. Flow voids are maintained throughout the carotid, basilar, and vertebral arteries. There are no areas of chronic hemorrhage. Pituitary, pineal, and cerebellar tonsils unremarkable. No upper cervical lesions. Visualized calvarium, skull base, and upper cervical osseous structures unremarkable. Scalp and extracranial soft tissues, orbits, sinuses, and mastoids show no acute process. Post infusion, no abnormal enhancement of the brain or meninges. At the RIGHT MCA bifurcation, there appears to be a 6 mm saccular aneurysm, incompletely evaluated on non MRA study. No Associated subarachnoid blood on CT nor on MR, the therefore likely unruptured. IMPRESSION: Mild to moderate atrophy and small vessel disease. No acute intracranial findings. 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 MRA intracranial could be performed after for 24 hours but cannot be performed RIGHT away due to intravascular blood pool of gadolinium. CTA intracranial could be performed at any time but is not necessarily  indicated on emergent basis. Electronically Signed   By: Elsie StainJohn T Curnes M.D.   On: 04/10/2016 17:39   Dg Hips Bilat With Pelvis 3-4 Views  Result Date: 04/09/2016 CLINICAL DATA:  Larey SeatFell 1 week ago, hip pain, both hips hurt EXAM: DG HIP (WITH OR WITHOUT PELVIS) 3-4V BILAT COMPARISON:  None FINDINGS: Osseous demineralization. Symmetric SI joints. RIGHT hip joint space fairly well preserved. LEFT hip joint space narrowing and spur formation. No acute fracture, dislocation, or bone destruction. Sacral foramina appear symmetric. Facet degenerative changes lower lumbar spine. Scattered atherosclerotic calcifications. IMPRESSION: Osseous demineralization with degenerative changes of LEFT hip joint. No definite acute bony abnormalities. Electronically Signed   By: Ulyses SouthwardMark  Boles M.D.   On: 04/09/2016 15:49   CT Head Mild, likely age-appropriate atrophy and microvascular ischemic disease without acute intracranial process. Xray Lumbar: Mild straightening of the expected lumbar lordosis, nonspecific though could be seen in the setting of muscle spasm. Otherwise, no acute findings. Mild-to-moderate multilevel lumbar spine DDD, worse at L2-L3, similar to the 01/2015 examination. Xray Hip: Osseous demineralization with degenerative changes of LEFT hip joint. No definite acute bony abnormalities.  Assessment & Plan by Problem: Barbara Hill is a 75 y.o. female with PMH orthostatic hypotension, anxiety, HTN, Mitral Valve prolapse, and supraventricular tachycardia. Presented today with dizziness, falls, and orthostatic hypotension.   Active Problems:   Syncope  1. Syncope - Orthostatic hypotension 123/65 supine to 77/52 standing, Negative CT head. ENT said she does not have vertigo. Cardiology decreased her metoprolol and lisinopril Rx. Held amlodipine and metoprolol for now, will monitor for SVT and may reduce flecainide is she remains dizzy.  -follow up echo   2. Back pain- Xrays are negative for fracture or  acute process. She has muscle spasm on exam. Ordered tylenol PRN for now and will continue to monitor.   3. AKI  Elevation in SCr from baseline of 0.8-1.02 and abnormal urinalysis.  -follow up urine culture   4. Hypokalemia- repleated with potassium chloride 20 mEq - follow up BMET  F Normal saline  E Hypokalemia N regular diet   DVT Ppx Lovenox  Code Status DNR   Dispo: Admit patient to Observation with expected length of stay less than 2 midnights.  Signed: Coralee Northina  Obie DredgeBlum, MD 04/10/2016, 10:17 PM  Pager: [phone]

## 2016-04-10 NOTE — ED Triage Notes (Signed)
Pt presents with continued dizziness and multiple falls x 2 months.  Pt reports since last fall was 1 week ago, pt reports getting up from sofa, and woke up on the floor.  Pt was seen by PCP, has MRI scheduled on head next Thursday.  Pt reports she was orthostatic at Dr.s office with BP in 70s when she stood.  Pt c/o pain to coccyx area.

## 2016-04-10 NOTE — ED Provider Notes (Signed)
[facility]PT Provider Note   CSN: [id number] Arrival date & time: 04/10/16  [id number]     History   Chief Complaint Chief Complaint  Patient presents with  . Fall    HPI Barbara Hill is a 75 y.o. female.  Patient complains of low back pain after a fall. She states she's had recurrent falls over the past 2 months due to dizziness when standing up. She was started on Midrin by her PCP without effect. She endorses her prodrome of dizziness and room spinning and lightheadedness when she tries to stand up. Last fall was one week ago syncopal episode. Workup on the floor. She is scheduled for an MRI of her brain. She denies any chest pain or shortness of breath. She denies any nausea, vomiting or diarrhea. She denies any focal weakness, numbness or tingling. No incontinence. She reports she was orthostatic in the office last week. She complains of pain to her low back now. No focal weakness, numbness or tingling. No headache or neck pain now.   The history is provided by the patient and a relative.  Fall  Pertinent negatives include no chest pain, no abdominal pain, no headaches and no shortness of breath.    Past Medical History:  Diagnosis Date  . Anxiety   . Arrhythmia    palps  . COPD (chronic obstructive pulmonary disease) (HCC)   . Dizziness   . Fatigue   . H/O: hysterectomy   . Headache(784.0)   . Hypertension   . Mitral valve prolapse   . Radiculopathy   . SVT (supraventricular tachycardia) (HCC)   . UTI (lower urinary tract infection)     Patient Active Problem List   Diagnosis Date Noted  . SVT (supraventricular tachycardia) (HCC) 04/21/2011  . 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 Tobacco use disorder 04/21/2011    Past Surgical History:  Procedure Laterality Date  . ABDOMINAL HYSTERECTOMY    . APPENDECTOMY    . CARDIAC CATHETERIZATION  12/95  . CHOLECYSTECTOMY    . ROTATOR CUFF REPAIR      OB History    No data available       Home Medications     Prior to Admission medications   Medication Sig Start Date End Date Taking? Authorizing Provider  acetaminophen (TYLENOL) 325 MG tablet Take 650 mg by mouth every 6 (six) hours as needed for mild pain, moderate pain or headache.    Historical Provider, MD  ALPRAZolam [name](XANAX) 0.5 MG tablet Take 0.5 mg by mouth 3 (three) times daily.     Historical Provider, MD  amLODipine (NORVASC) 10 MG tablet Take 1 tablet (10 mg total) by mouth daily. 02/11/15   Rosalio MacadamiaLori C Gerhardt, NP  butalbital-acetaminophen-caffeine (FIORICET, ESGIC) [phone] MG per tablet Take 1 tablet by mouth every 4 (four) hours as needed for headache or migraine.  07/06/14   Historical Provider, MD  fexofenadine-pseudoephedrine (ALLEGRA-D) 60-120 MG 12 hr tablet Take 1 tablet by mouth daily.    Historical Provider, MD  flecainide (TAMBOCOR) 100 MG tablet Take 1 tablet (100 mg total) by mouth 2 (two) times daily. 03/31/16   Beatrice LecherScott T Weaver, PA-C  FLUoxetine (PROZAC) 40 MG capsule Take 40 mg by mouth daily.      Historical Provider, MD  fluticasone (FLONASE) 50 MCG/ACT nasal spray Place 1 spray into both nostrils daily as needed for allergies or rhinitis.  10/11/14   Historical Provider, MD  lisinopril (PRINIVIL,ZESTRIL) 10 MG tablet Take 1 tablet (10 mg total) by  mouth daily. 03/31/16   Beatrice LecherScott T Weaver, PA-C  meclizine (ANTIVERT) 25 MG tablet Take 12.5-25 mg by mouth 2 (two) times daily as needed for dizziness or nausea.  04/04/14   Historical Provider, MD  metoprolol succinate (TOPROL-XL) 25 MG 24 hr tablet TAKE 1 TABLET BY MOUTH EVERY NIGHT AT BEDTIME 04/01/16   Beatrice LecherScott T Weaver, PA-C  Multiple Vitamins-Minerals (MULTIVITAMIN PO) Take 1 tablet by mouth daily.    Historical Provider, MD  omeprazole (PRILOSEC) 40 MG capsule Take 40 mg by mouth daily.      Historical Provider, MD  ranitidine (ZANTAC) 150 MG tablet Take 1 tablet (150 mg total) by mouth 2 (two) times daily. 02/21/15   Joycie PeekBenjamin Cartner, PA-C  Vitamin D, Ergocalciferol, (DRISDOL) 50000 UNITS  CAPS capsule Take 1 capsule by mouth once a week. Take on Tuesdays 10/29/14   Historical Provider, MD    Family History Family History  Problem Relation Age of Onset  . Heart attack Mother   . Transient ischemic attack Mother   . Heart attack Father 2780  . Heart attack Other   . Asthma Other   . Heart failure Other   . Osteoporosis Other   . Heart Problems Brother   . CAD      Social History Social History  Substance Use Topics  . Smoking status: Current Every Day Smoker  . Smokeless tobacco: Current User  . Alcohol use No     Allergies   Dobutamine; Epinephrine; Darvocet [propoxyphene n-acetaminophen]; Excedrin extra strength [aspirin-acetaminophen-caffeine]; Propoxyphene; Clarithromycin; Clarithromycin; Doxycycline; Penicillins; and Prednisone   Review of Systems Review of Systems  Constitutional: Positive for fatigue. Negative for activity change, appetite change and fever.  HENT: Negative for congestion and postnasal drip.   Respiratory: Negative for cough, chest tightness and shortness of breath.   Cardiovascular: Negative for chest pain and leg swelling.  Gastrointestinal: Negative for abdominal pain, nausea and vomiting.  Genitourinary: Negative for dysuria and hematuria.  Musculoskeletal: Positive for arthralgias, back pain and myalgias.  Skin: Negative for rash.  Neurological: Positive for dizziness, weakness and light-headedness. Negative for headaches.  A complete 10 system review of systems was obtained and all systems are negative except as noted in the HPI and PMH.    Physical Exam Updated Vital Signs BP 101/66   Pulse 66   Temp 98.4 F (36.9 C) (Oral)   Resp 14   SpO2 98%   Physical Exam  Constitutional: She is oriented to person, place, and time. She appears well-developed and well-nourished. No distress.  HENT:  Head: Normocephalic and atraumatic.  Mouth/Throat: Oropharynx is clear and moist. No oropharyngeal exudate.  Eyes: Conjunctivae and EOM  are normal. Pupils are equal, round, and reactive to light.  Neck: Normal range of motion. Neck supple.  No C spine pain.  Cardiovascular: Normal rate, regular rhythm, normal heart sounds and intact distal pulses.   No murmur heard. Pulmonary/Chest: Effort normal and breath sounds normal. No respiratory distress.  Abdominal: Soft. There is no tenderness. There is no rebound and no guarding.  Musculoskeletal: Normal range of motion. She exhibits tenderness. She exhibits no edema.  TTP Lumbar spine without stepoff FROM hips without pain.  Neurological: She is alert and oriented to person, place, and time. No cranial nerve deficit. She exhibits normal muscle tone. Coordination normal.   5/5 strength throughout. CN 2-12 intact.Equal grip strength.  No ataxia on finger to nose, no nystagmus. Positive Romberg, gait not tested  Skin: Skin is warm.  Psychiatric: She  has a normal mood and affect. Her behavior is normal.  Nursing note and vitals reviewed.    ED Treatments / Results  Labs (all labs ordered are listed, but only abnormal results are displayed) Labs Reviewed  CBC WITH DIFFERENTIAL/PLATELET - Abnormal; Notable for the following:       Result Value   RBC 3.85 (*)    Hemoglobin 11.8 (*)    All other components within normal limits  COMPREHENSIVE METABOLIC PANEL - Abnormal; Notable for the following:    Potassium 3.4 (*)    Creatinine, Ser 1.02 (*)    Albumin 3.4 (*)    ALT 7 (*)    GFR calc non Af Amer 53 (*)    All other components within normal limits  URINALYSIS, ROUTINE W REFLEX MICROSCOPIC (NOT AT [facility]) - Abnormal; Notable for the following:    Hgb urine dipstick TRACE (*)    Leukocytes, UA SMALL (*)    All other components within normal limits  URINE MICROSCOPIC-ADD ON - Abnormal; Notable for the following:    Squamous Epithelial / LPF 0-5 (*)    Bacteria, UA FEW (*)    All other components within normal limits  URINE CULTURE  TROPONIN I  BASIC METABOLIC PANEL    CBC    EKG  EKG Interpretation  Date/Time:  Friday April 10 2016 10:01:12 EDT Ventricular Rate:  66 PR Interval:    QRS Duration: 110 QT Interval:  462 QTC Calculation: 485 R Axis:   -59 Text Interpretation:  Sinus rhythm Prolonged PR interval Left anterior fascicular block Anterior infarct, old No significant change was found Confirmed by Manus Gunning  MD, Keishawn Rajewski [phone]) on 04/10/2016 10:05:00 AM       Radiology Dg Hips Bilat With Pelvis 3-4 Views  Result Date: 04/09/2016 CLINICAL DATA:  Larey Seat 1 week ago, hip pain, both hips hurt EXAM: DG HIP (WITH OR WITHOUT PELVIS) 3-4V BILAT COMPARISON:  None FINDINGS: Osseous demineralization. Symmetric SI joints. RIGHT hip joint space fairly well preserved. LEFT hip joint space narrowing and spur formation. No acute fracture, dislocation, or bone destruction. Sacral foramina appear symmetric. Facet degenerative changes lower lumbar spine. Scattered atherosclerotic calcifications. IMPRESSION: Osseous demineralization with degenerative changes of LEFT hip joint. No definite acute bony abnormalities. Electronically Signed   By: Ulyses Southward M.D.   On: 04/09/2016 15:49    Procedures Procedures (including critical care time)  Medications Ordered in ED Medications  sodium chloride 0.9 % bolus 1,000 mL (1,000 mLs Intravenous New Bag/Given 04/10/16 1031)     Initial Impression / Assessment and Plan / ED Course  I have reviewed the triage vital signs and the nursing notes.  Pertinent labs & imaging results that were available during my care of the patient were reviewed by me and considered in my medical decision making (see chart for details).  Clinical Course   Patient presents with low back pain and head pain after fall 1 week ago. Has had recurrent falls due to dizziness and syncopal episodes and lightheadedness. Nonfocal neurological exam now but becomes dizzy with standing with positive Romberg.  She is profoundly orthostatic with blood  pressure 77 systolic when she stands up. IVF and PO fluids given.  Patient labs are reassuring. Urinalysis is negative. EKG is unchanged. CT head negative. LS xray negative.  Plan admission for recurrent syncope and severe orthostasis.  D/w Dr. Isabella Bowens. Final Clinical Impressions(s) / ED Diagnoses   Final diagnoses:  Syncope, unspecified syncope type  Orthostatic hypotension  Lumbar contusion,  initial encounter  Otalgia    New Prescriptions New Prescriptions   No medications on file     Glynn Octave, MD 04/10/16 1723

## 2016-04-10 NOTE — ED Notes (Signed)
Ambulated patient to bathroom and back to bed; tolerated well.

## 2016-04-11 ENCOUNTER — Observation Stay (HOSPITAL_BASED_OUTPATIENT_CLINIC_OR_DEPARTMENT_OTHER): Payer: Medicare HMO

## 2016-04-11 DIAGNOSIS — S300XXA Contusion of lower back and pelvis, initial encounter: Secondary | ICD-10-CM

## 2016-04-11 DIAGNOSIS — R42 Dizziness and giddiness: Secondary | ICD-10-CM

## 2016-04-11 DIAGNOSIS — F17210 Nicotine dependence, cigarettes, uncomplicated: Secondary | ICD-10-CM

## 2016-04-11 DIAGNOSIS — I10 Essential (primary) hypertension: Secondary | ICD-10-CM | POA: Diagnosis not present

## 2016-04-11 DIAGNOSIS — N179 Acute kidney failure, unspecified: Secondary | ICD-10-CM | POA: Diagnosis not present

## 2016-04-11 DIAGNOSIS — I951 Orthostatic hypotension: Secondary | ICD-10-CM

## 2016-04-11 DIAGNOSIS — R55 Syncope and collapse: Secondary | ICD-10-CM | POA: Diagnosis not present

## 2016-04-11 DIAGNOSIS — E876 Hypokalemia: Secondary | ICD-10-CM

## 2016-04-11 DIAGNOSIS — I471 Supraventricular tachycardia: Secondary | ICD-10-CM | POA: Diagnosis not present

## 2016-04-11 DIAGNOSIS — I440 Atrioventricular block, first degree: Secondary | ICD-10-CM | POA: Diagnosis not present

## 2016-04-11 DIAGNOSIS — M545 Low back pain: Secondary | ICD-10-CM

## 2016-04-11 LAB — ECHOCARDIOGRAM COMPLETE
HEIGHTINCHES: 63 in
Weight: 1910.4 oz

## 2016-04-11 LAB — BASIC METABOLIC PANEL
Anion gap: 6 (ref 5–15)
BUN: <5 mg/dL — ABNORMAL LOW (ref 6–20)
CALCIUM: 7.8 mg/dL — AB (ref 8.9–10.3)
CHLORIDE: 107 mmol/L (ref 101–111)
CO2: 26 mmol/L (ref 22–32)
Creatinine, Ser: 0.73 mg/dL (ref 0.44–1.00)
GFR calc Af Amer: >60 mL/min (ref >60)
Glucose, Bld: 91 mg/dL (ref 65–99)
POTASSIUM: 2.8 mmol/L — AB (ref 3.5–5.1)
Sodium: 139 mmol/L (ref 135–145)

## 2016-04-11 LAB — CBC
HEMATOCRIT: 30.4 % — AB (ref 36.0–46.0)
HEMOGLOBIN: 9.7 g/dL — AB (ref 12.0–15.0)
MCH: 29.7 pg (ref 26.0–34.0)
MCHC: 31.9 g/dL (ref 30.0–36.0)
MCV: 93 fL (ref 78.0–100.0)
Platelets: 311 10*3/uL (ref 150–400)
RBC: 3.27 MIL/uL — AB (ref 3.87–5.11)
RDW: 12.9 % (ref 11.5–15.5)
WBC: 6.5 10*3/uL (ref 4.0–10.5)

## 2016-04-11 LAB — MAGNESIUM: MAGNESIUM: 1.3 mg/dL — AB (ref 1.7–2.4)

## 2016-04-11 LAB — GLUCOSE, CAPILLARY: GLUCOSE-CAPILLARY: 99 mg/dL (ref 65–99)

## 2016-04-11 MED ORDER — POTASSIUM CHLORIDE CRYS ER 20 MEQ PO TBCR
40.0000 meq | EXTENDED_RELEASE_TABLET | Freq: Two times a day (BID) | ORAL | Status: DC
  Administered 2016-04-11: 40 meq via ORAL
  Filled 2016-04-11: qty 2

## 2016-04-11 NOTE — Progress Notes (Signed)
Subjective: Today [name] states she is feeling good and ready to go home. She tells us that her orthostatic blood pressures where improved this morning and she feels like the fluids have helped her to feel better. We counseled her on taking time to stand up from a seated or lying position and wearing tight stockings.   Objective:  Vital signs in last 24 hours: Vitals:   04/10/16 1736 04/10/16 2040 04/11/16 0020 04/11/16 0545  BP: 127/75 (!) 137/59 (!) 155/56   Pulse: 70 63 (!) 58 62  Resp: 16 17 18 18   Temp: 97.8 F (36.6 C) 97.5 F (36.4 C) 97.6 F (36.4 C) 98.4 F (36.9 C)  TempSrc: Oral Oral Oral Oral  SpO2: 96% 96% 95% 96%  Weight: 118 lb 1.6 oz (53.6 kg)   119 lb 6.4 oz (54.2 kg)  Height: 5\' 3"  (1.6 m)      Physical Exam  Constitutional: She appears well-developed and well-nourished. No distress.  Eyes: EOM are normal. Pupils are equal, round, and reactive to light.  Cardiovascular: Normal rate and regular rhythm.   Pulmonary/Chest: She has no wheezes. She has no rales.  Abdominal: Bowel sounds are normal.  Neurological: She is alert. No cranial nerve deficit.  Extremities: knee high TED stalkings, no peripheral edema, no calf tenderness   Labs: CBC:  Recent Labs Lab 04/10/16 1030 04/11/16 0221  WBC 7.8 6.5  NEUTROABS 4.6  --   HGB 11.8* 9.7*  HCT 36.0 30.4*  MCV 93.5 93.0  PLT 358 311   Metabolic Panel:  Recent Labs Lab 04/10/16 1030 04/11/16 0221  NA 136 139  K 3.4* 2.8*  CL 102 107  CO2 29 26  GLUCOSE 98 91  BUN 7 <5*  CREATININE 1.02* 0.73  CALCIUM 9.3 7.8*  ALT 7*  --   ALKPHOS 83  --   BILITOT 0.4  --   PROT 7.2  --   ALBUMIN 3.4*  --   BG:  Recent Labs Lab 04/11/16 0531  GLUCAP 99    Imaging: CT Head 8/18: Mild, likely age-appropriate atrophy and microvascular ischemic disease without acute intracranial process. Xray Lumbar 8/18: Mild straightening of the expected lumbar lordosis, nonspecific though could be seen in the setting  of muscle spasm. Otherwise, no acute findings. Mild-to-moderate multilevel lumbar spine DDD, worse at L2-L3, similar to the 01/2015 examination. Xray Hip 8/18: Osseous demineralization with degenerative changes of LEFT hip joint. No definite acute bony abnormalities Echo 8/19 EF 60-65%, mild LVH, no wall motion abnormality.   Medications:   Scheduled Medications: . enoxaparin (LOVENOX) injection  40 mg Subcutaneous Q24H  . flecainide  50 mg Oral BID  . loratadine  10 mg Oral Daily  . pantoprazole  40 mg Oral Daily  . sodium chloride flush  3 mL Intravenous Q12H   PRN Medications: acetaminophen **OR** acetaminophen, ALPRAZolam, fluticasone, senna-docusate  Assessment/Plan: Pt is a 75 y.o. yo female with a PMHx of orthostatic hypotension, anxiety, HTN, Mitral Valve prolapse, and supraventricular tachycardia who was admitted on 04/10/2016 with symptoms of dizziness and falls which was determined to be secondary to orthostatic hypotension and vestibular deficit.   Active Problems:   Syncope  Syncope-  Cardiology consult recommended discharge on home medication regiment. Discontinuation of Flecanide should only be considered if orthostatics improve but she continues to experience dizziness. Physical therapy evaluated and  recommended vestibular PT.  PT recommended outpatient Vestibular rehab.   3. AKI  Elevation in SCr from baseline of 0.8-1.02  4. Abnormal Urinalysis with few bacteria, small leukocytes and squamous epithelium -will follow up urine culture  Dispo: Anticipated discharge in approximately today   LOS: 0 days   Eulah PontNina Eithel Ryall, MD 04/11/2016, 6:52 AM Pager: [phone]

## 2016-04-11 NOTE — Progress Notes (Signed)
  Echocardiogram 2D Echocardiogram has been performed.  Barbara Hill, Barbara Hill M 04/11/2016, 2:08 PM

## 2016-04-11 NOTE — Care Management Note (Signed)
Case Management Note  Patient Details  Name: Barbara Hill MRN: [id number] Date of Birth: 12/03/1940  Subjective/Objective:                  Fall Action/Plan: Discharge  planning Expected Discharge Date:                  Expected Discharge Plan:  Home/Self Care  In-House Referral:     Discharge planning Services  CM Consult  Post Acute Care Choice:  NA Choice offered to:  NA  DME Arranged:    DME Agency:  NA  HH Arranged:  NA HH Agency:     Status of Service:  Completed, signed off  If discussed at Long Length of Stay Meetings, dates discussed:    Additional Comments: CM met with pt in room to give her and her spouse directions and [facility].  Per MD order, CM has faxed referral for [facility] Rehab [facility] with request they call pt to schedule an appointment. No other CM needs were communicated. ,  Christine, RN 04/11/2016, 4:16 PM  

## 2016-04-11 NOTE — Consult Note (Signed)
CARDIOLOGY CONSULT NOTE     Primary Care Physician: Lilia ArgueKAPLAN,KRISTEN, PA-C Referring Physician:  Dr Cyndie ChimeGranfortuna  Admit Date: 04/10/2016  Reason for consultation:  Orthostatic hypotension with dizziness  Barbara Hill is a 75 y.o. female with a h/o prior SVT and HTN who presents with worsening orthostasis.  She was recently seen in the cardiology clinic by Tereso NewcomerScott Weaver.  Her medicines were adjusted at that time.  She continues to have worsening symptoms of postural dizziness.  [facility] she is found to have profound orthostasis.  She has chronic SOB also. She has had prior SVT for which she was placed on flecainide by Dr Alanda AmassWeintraub.  She has done well for years without sustained events.  Today, she denies symptoms of palpitations, chest pain, orthopnea, PND, lower extremity edema, syncope, or other neurologic sequela. The patient is tolerating medications without difficulties and is otherwise without complaint today.   Past Medical History:  Diagnosis Date  . Anxiety   . Arrhythmia    palps  . COPD (chronic obstructive pulmonary disease) (HCC)   . Dizziness   . Fatigue   . H/O: hysterectomy   . Headache(784.0)   . Hypertension   . Mitral valve prolapse   . Radiculopathy   . SVT (supraventricular tachycardia) (HCC)   . UTI (lower urinary tract infection)    Past Surgical History:  Procedure Laterality Date  . ABDOMINAL HYSTERECTOMY    . APPENDECTOMY    . CARDIAC CATHETERIZATION  12/95  . CHOLECYSTECTOMY    . ROTATOR CUFF REPAIR      . enoxaparin (LOVENOX) injection  40 mg Subcutaneous Q24H  . flecainide  50 mg Oral BID  . loratadine  10 mg Oral Daily  . pantoprazole  40 mg Oral Daily  . potassium chloride  40 mEq Oral BID WC  . sodium chloride flush  3 mL Intravenous Q12H      Allergies  Allergen Reactions  . Dobutamine Other (See Comments)    Heart beating hard with CP, neck pain, and weakness  . Epinephrine Other (See Comments)    Fast heart  beat  . Darvocet [Propoxyphene N-Acetaminophen] Nausea And Vomiting  . Excedrin Extra Strength [Aspirin-Acetaminophen-Caffeine] Nausea And Vomiting  . Propoxyphene Nausea And Vomiting  . Clarithromycin Other (See Comments)    Yeast infection  . Clarithromycin Other (See Comments)    Yeast infection  . Doxycycline Other (See Comments)    Makes stomach hurt  . Penicillins Other (See Comments)    Yeast infection   . Prednisone Other (See Comments)    Shakes     Social History   Social History  . Marital status: Divorced    Spouse name: N/A  . Number of children: N/A  . Years of education: N/A   Occupational History  . Not on file.   Social History Main Topics  . Smoking status: Current Every Day Smoker  . Smokeless tobacco: Current User  . Alcohol use No  . Drug use: Unknown  . Sexual activity: Not on file   Other Topics Concern  . Not on file   Social History Narrative  . No narrative on file    Family History  Problem Relation Age of Onset  . Heart attack Mother   . Transient ischemic attack Mother   . Heart attack Father 7980  . Heart attack Other   . Asthma Other   . Heart failure Other   . Osteoporosis Other   . Heart  Problems Brother   . CAD      ROS- All systems are reviewed and negative except as per the HPI above  Physical Exam: Telemetry: sinus rhythm Vitals:   04/10/16 2040 04/11/16 0020 04/11/16 0545 04/11/16 1215  BP: (!) 137/59 (!) 155/56  (!) 178/69  Pulse: 63 (!) 58 62 66  Resp: 17 18 18 20   Temp: 97.5 F (36.4 C) 97.6 F (36.4 C) 98.4 F (36.9 C) 98.9 F (37.2 C)  TempSrc: Oral Oral Oral Oral  SpO2: 96% 95% 96% 100%  Weight:   119 lb 6.4 oz (54.2 kg)   Height:        GEN- The patient is elderly and thin appearing, alert and oriented x 3 today.   Head- normocephalic, atraumatic Eyes-  Sclera clear, conjunctiva pink Ears- hearing intact Oropharynx- clear with dry MM Neck- supple, no JVP Lymph- no cervical  lymphadenopathy Lungs- Clear to ausculation bilaterally, normal work of breathing Heart- Regular rate and rhythm, no murmurs, rubs or gallops, PMI not laterally displaced GI- soft, NT, ND, + BS  Extremities- no clubbing, cyanosis, or edema MS- no significant deformity or atrophy Skin- no rash or lesion Psych- euthymic mood, full affect Neuro- strength and sensation are intact  EKG: reveals sinus rhythm 66 bp, first degree AV block, LAHB  Labs:   Lab Results  Component Value Date   WBC 6.5 04/11/2016   HGB 9.7 (L) 04/11/2016   HCT 30.4 (L) 04/11/2016   MCV 93.0 04/11/2016   PLT 311 04/11/2016    Recent Labs Lab 04/10/16 1030 04/11/16 0221  NA 136 139  K 3.4* 2.8*  CL 102 107  CO2 29 26  BUN 7 <5*  CREATININE 1.02* 0.73  CALCIUM 9.3 7.8*  PROT 7.2  --   BILITOT 0.4  --   ALKPHOS 83  --   ALT 7*  --   AST 15  --   GLUCOSE 98 91   Lab Results  Component Value Date   CKMB 0.9 10/22/2013   TROPONINI <0.03 04/10/2016    Lab Results  Component Value Date   CHOL 199 07/21/2013   Lab Results  Component Value Date   HDL 63.60 07/21/2013   Lab Results  Component Value Date   LDLCALC 115 (H) 07/21/2013   Lab Results  Component Value Date   TRIG 104.0 07/21/2013   Lab Results  Component Value Date   CHOLHDL 3 07/21/2013   No results found for: LDLDIRECT    Echo:  Preserved EF, no significant structural abnormality  ASSESSMENT AND PLAN:    1. Orthostatic hypotension Her dizziness appears to be due to orthostasis.  She was demonstrated to be markedly dehydrated upon initial evaluation and was also orthostatic in the office recently. I would advise IV hydration at this time.  Support hose may also be helpful.  As she is typically hypertensive, I would not advise midodrine or florinef at this time.  Typically dizziness associated with flecainide is not associated with orthostasis.  If however despite optimization of volume she remains orthostatic, would  consider stopping flecainide at that time.  2. SVT Well controlled Could consider stopping flecainide as above if needed   Hillis RangeJames Edelmira Gallogly, MD 04/11/2016  3:32 PM

## 2016-04-11 NOTE — Progress Notes (Signed)
Pt d/c'd home with friend. D/c instructions given to and d/w pt. Pt verbalizes understanding

## 2016-04-11 NOTE — Discharge Instructions (Signed)
Thank you for trusting us with your medical care!  You were hospitalized for dizziness (syncope) and treated with rehydration and stopping some of your heart medications.   Please take note of the following changes to your medications: none  To make sure you are getting better, please make it to the follow-up appointments listed on the first page.  If you have any questions, please call [phone].

## 2016-04-11 NOTE — Progress Notes (Signed)
Physical Therapy Vestibular Assessment    04/11/16 1241  Vestibular Assessment  General Observation Patient reports dizziness as lightheaded, off balance feeling.  Occurs after sitting up for about 30 seconds, and with ambulation after about 15 seconds.  Head turns and leaning forward also cause dizziness.  Symptom Behavior  Type of Dizziness Imbalance  Frequency of Dizziness throughout day  Duration of Dizziness 30-60 seconds  Aggravating Factors Turning head quickly;Turning body quickly;Supine to sit;Sit to stand  Relieving Factors Head stationary;Slow movements  Occulomotor Exam  Occulomotor Alignment Normal  Spontaneous Absent  Gaze-induced Absent  Head shaking Horizontal Comment  Head Shaking Vertical Absent  Smooth Pursuits Intact  Saccades Intact  Comment With horizontal head shaking, no nystagmus, but patient reports dizziness.  Vestibulo-Occular Reflex  VOR 1 Head Only (x 1 viewing) Noted positive head thrust to right - repeatable x3.  Auditory  Comments No hearing loss  Positional Testing  Horizontal Canal Testing Horizontal Canal Right;Horizontal Canal Left  Horizontal Canal Right  Horizontal Canal Right Duration Negative  Horizontal Canal Left  Horizontal Canal Left Duration Negative  Cognition  Cognition Orientation Level Oriented x 4  Barbara HurtSusan H. Renaldo Hill, PT, [facility] Acute Rehab Services Pager [phone]

## 2016-04-11 NOTE — Progress Notes (Signed)
Physical Therapy Treatment Patient Details Name: Barbara Hill MRN: [id number] DOB: 08/11/1941 Today's Date: 04/11/2016    History of Present Illness Patient is a 75 yo female admitted 04/10/16 following syncope with fall, hitting head and back.  Patient c/o dizziness.   PMH:  migraines, SVT, COPD, HTN, MVP, anxiety, dizzy spells    PT Comments    Provided patient with education on x1 exercises to be done horizontally and vertically 3x/day to address Rt hypofunction.  Continue to recommend f/u OP PT for vestibular rehab and balance training.  Follow Up Recommendations  Supervision for mobility/OOB;Outpatient PT (OP PT for Vestibular Rehab)     Equipment Recommendations  None recommended by PT    Recommendations for Other Services       Precautions / Restrictions Precautions Precautions: Fall Restrictions Weight Bearing Restrictions: No    Mobility  Bed Mobility Overal bed mobility: Independent             General bed mobility comments: Patient sat EOB for x1 exercises  Transfers          Ambulation/Gait   Stairs            Wheelchair Mobility    Modified Rankin (Stroke Patients Only)       Balance Overall balance assessment: Needs assistance Sitting-balance support: No upper extremity supported;Feet supported Sitting balance-Leahy Scale: Good Sitting balance - Comments: Minimizes head movement and bending forward - fear of dizziness                      Cognition Arousal/Alertness: Awake/alert Behavior During Therapy: WFL for tasks assessed/performed Overall Cognitive Status: Within Functional Limits for tasks assessed                      Exercises Other Exercises Other Exercises: Vestibular Exercises:  Instructed patient and significant other on x1 exercises, horizontal and vertical.  Patient able to perform.  Instructed to perform 3x/day at home.    General Comments        Pertinent Vitals/Pain Pain Assessment:  0-10 Pain Score: 3  Pain Location: Headache Pain Descriptors / Indicators: Headache Pain Intervention(s): Monitored during session    Home Living Family/patient expects to be discharged to:: Private residence Living Arrangements: Spouse/significant other Available Help at Discharge: Family;Available 24 hours/day (Significant other) Type of Home: House Home Access: Stairs to enter Entrance Stairs-Rails: None Home Layout: One level Home Equipment: Environmental consultantWalker - 2 wheels;Shower seat      Prior Function Level of Independence: Independent      Comments: Drives   PT Goals (current goals can now be found in the care plan section) Acute Rehab PT Goals Patient Stated Goal: To stop the dizziness PT Goal Formulation: With patient Time For Goal Achievement: 04/18/16 Potential to Achieve Goals: Good Progress towards PT goals: Progressing toward goals    Frequency  Min 4X/week    PT Plan Current plan remains appropriate    Co-evaluation             End of Session   Activity Tolerance: Other (comment) (Limited by dizziness) Patient left: in bed;with call bell/phone within reach;with family/visitor present (sitting EOB)     Time: [id number] PT Time Calculation (min) (ACUTE ONLY): 13 min  Charges:  $Therapeutic Exercise: 8-22 mins                    G Codes:  Functional Assessment Tool Used: Clinical judgement Functional Limitation: Mobility: Walking  and moving around Mobility: Walking and Moving Around Goal Status [phone](G8979): 0 percent impaired, limited or restricted Mobility: Walking and Moving Around Discharge Status [phone](G8980): At least 1 percent but less than 20 percent impaired, limited or restricted   Vena AustriaDavis, Beyounce Dickens H 04/11/2016, 2:47 PM Durenda HurtSusan H. Renaldo Fiddleravis, PT, [facility] Acute Rehab Services Pager [phone]

## 2016-04-11 NOTE — Evaluation (Signed)
Physical Therapy Evaluation Patient Details Name: Barbara Hill MRN: [id number] DOB: 03/07/1941 Today's Date: 04/11/2016   History of Present Illness  Patient is a 75 yo female admitted 04/10/16 following syncope with fall, hitting head and back.  Patient c/o dizziness.   PMH:  migraines, SVT, COPD, HTN, MVP, anxiety, dizzy spells    Clinical Impression  Patient presents with problems listed below.  Patient with dizziness impacting balance and mobility.  Patient had positive VOR head thrust to right, indicating unilateral hypofunction.  See Vestibular Assessment note below.  Will return for treatment session this pm.  Recommend f/u OP PT for vestibular rehab and balance training.    Follow Up Recommendations Supervision for mobility/OOB;Outpatient PT (for Vestibular Rehab)    Equipment Recommendations  None recommended by PT    Recommendations for Other Services       Precautions / Restrictions Precautions Precautions: Fall Restrictions Weight Bearing Restrictions: No      Mobility  Bed Mobility Overal bed mobility: Independent                Transfers Overall transfer level: Needs assistance Equipment used: None Transfers: Sit to/from Stand Sit to Stand: Supervision         General transfer comment: Supervision for safety only.  No dizziness on initial stance.  Ambulation/Gait Ambulation/Gait assistance: Supervision Ambulation Distance (Feet): 80 Feet Assistive device: None Gait Pattern/deviations: Step-through pattern;Decreased stride length;Drifts right/left Gait velocity: decreased Gait velocity interpretation: Below normal speed for age/gender General Gait Details: Patient with slow gait speed, with slight imbalance noted during turns.  Patient reports slight dizziness with turns and at end of gait.  Stairs            Wheelchair Mobility    Modified Rankin (Stroke Patients Only)       Balance Overall balance assessment: Needs  assistance Sitting-balance support: No upper extremity supported;Feet supported Sitting balance-Leahy Scale: Good Sitting balance - Comments: Minimizes head movement and bending forward - fear of dizziness   Standing balance support: No upper extremity supported Standing balance-Leahy Scale: Good Standing balance comment: Slight imbalance with gait.  Dizziness after ambulating 70'.                             Pertinent Vitals/Pain Pain Assessment: 0-10 Pain Score: 7  Pain Location: Headache Pain Descriptors / Indicators: Headache Pain Intervention(s): Monitored during session;Premedicated before session;Repositioned    Home Living Family/patient expects to be discharged to:: Private residence Living Arrangements: Spouse/significant other Available Help at Discharge: Family;Available 24 hours/day (Significant other) Type of Home: House Home Access: Stairs to enter Entrance Stairs-Rails: None Entrance Stairs-Number of Steps: 3 Home Layout: One level Home Equipment: Walker - 2 wheels;Shower seat      Prior Function Level of Independence: Independent         Comments: Drives     Hand Dominance        Extremity/Trunk Assessment   Upper Extremity Assessment: Overall WFL for tasks assessed           Lower Extremity Assessment: Overall WFL for tasks assessed         Communication   Communication: No difficulties  Cognition Arousal/Alertness: Awake/alert Behavior During Therapy: WFL for tasks assessed/performed Overall Cognitive Status: Within Functional Limits for tasks assessed                      General Comments  Exercises        Assessment/Plan    PT Assessment Patient needs continued PT services  PT Diagnosis Abnormality of gait (Dizziness)   PT Problem List Decreased activity tolerance;Decreased balance;Decreased mobility;Decreased knowledge of precautions  PT Treatment Interventions Gait training;Stair  training;Functional mobility training;Therapeutic activities;Therapeutic exercise;Balance training;Patient/family education   PT Goals (Current goals can be found in the Care Plan section) Acute Rehab PT Goals Patient Stated Goal: To stop the dizziness PT Goal Formulation: With patient Time For Goal Achievement: 04/18/16 Potential to Achieve Goals: Good    Frequency Min 4X/week   Barriers to discharge        Co-evaluation               End of Session   Activity Tolerance: Patient limited by pain (Limited by dizziness) Patient left: in bed;with call bell/phone within reach (sitting EOB for lunch) Nurse Communication: Mobility status    Functional Assessment Tool Used: Clinical judgement Functional Limitation: Mobility: Walking and moving around Mobility: Walking and Moving Around Current Status ([id number](G8978): At least 1 percent but less than 20 percent impaired, limited or restricted Mobility: Walking and Moving Around Goal Status [phone](G8979): 0 percent impaired, limited or restricted Mobility: Walking and Moving Around Discharge Status [phone](G8980): At least 1 percent but less than 20 percent impaired, limited or restricted    Time: 1215-1241 PT Time Calculation (min) (ACUTE ONLY): 26 min   Charges:   PT Evaluation $PT Eval Moderate Complexity: 1 Procedure PT Treatments $Therapeutic Activity: 8-22 mins   PT G Codes:   PT G-Codes **NOT FOR INPATIENT CLASS** Functional Assessment Tool Used: Clinical judgement Functional Limitation: Mobility: Walking and moving around Mobility: Walking and Moving Around Current Status ([id number](G8978): At least 1 percent but less than 20 percent impaired, limited or restricted Mobility: Walking and Moving Around Goal Status [phone](G8979): 0 percent impaired, limited or restricted Mobility: Walking and Moving Around Discharge Status [phone](G8980): At least 1 percent but less than 20 percent impaired, limited or restricted    Vena AustriaDavis, Abrianna Sidman H 04/11/2016, 2:32 PM Durenda HurtSusan H.  Renaldo Fiddleravis, PT, [facility] Acute Rehab Services Pager [phone]

## 2016-04-11 NOTE — Discharge Summary (Signed)
Name: Barbara Hill MRN: [id number] DOB: 01/01/1941 75 y.o. PCP: Richmond CampbellKristen W. Kaplan, PA-C  Date of Admission: 04/10/2016  9:51 AM Date of Discharge: 04/11/2016 Attending Physician: Dr Cephas DarbyJames Granfortuna   Discharge Diagnosis: 1. Syncope  Positive orthostatics on admission which improved with IV fluids    PT found vestibular abnormalities  2. Back pain   Lumbar and hip xrays negative for fracture or acute process   Exam found muscle spasm, likely related to her recent falls 3. Ecoli bacteriuria   Some urgency but denies dysuria Active Problems:   Syncope   Lumbar contusion   Orthostatic hypotension   First degree AV block   Hypokalemia  Discharge Medications:   Medication List    TAKE these medications   acetaminophen 325 MG tablet Commonly known as:  TYLENOL Take 650 mg by mouth every 6 (six) hours as needed for mild pain, moderate pain or headache.   acidophilus Caps capsule Take 1 capsule by mouth daily.   ALPRAZolam 0.5 MG tablet Commonly known as:  XANAX Take 0.5 mg by mouth 3 (three) times daily.   amLODipine 10 MG tablet Commonly known as:  NORVASC Take 1 tablet (10 mg total) by mouth daily. What changed:  how much to take   butalbital-acetaminophen-caffeine 50-325-40 MG tablet Commonly known as:  FIORICET, ESGIC Take 1 tablet by mouth every 4 (four) hours as needed for headache or migraine.   fexofenadine-pseudoephedrine 60-120 MG 12 hr tablet Commonly known as:  ALLEGRA-D Take 1 tablet by mouth daily.   flecainide 100 MG tablet Commonly known as:  TAMBOCOR Take 1 tablet (100 mg total) by mouth 2 (two) times daily.   FLUoxetine 40 MG capsule Commonly known as:  PROZAC Take 20 mg by mouth 2 (two) times daily.   fluticasone 50 MCG/ACT nasal spray Commonly known as:  FLONASE Place 1 spray into both nostrils daily as needed for allergies or rhinitis.   lisinopril 10 MG tablet Commonly known as:  PRINIVIL,ZESTRIL Take 1 tablet (10 mg total) by mouth  daily.   meclizine 25 MG tablet Commonly known as:  ANTIVERT Take 12.5-25 mg by mouth 2 (two) times daily as needed for dizziness or nausea.   metoprolol succinate 25 MG 24 hr tablet Commonly known as:  TOPROL-XL TAKE 1 TABLET BY MOUTH EVERY NIGHT AT BEDTIME   MULTIVITAMIN PO Take 1 tablet by mouth daily.   omeprazole 40 MG capsule Commonly known as:  PRILOSEC Take 40 mg by mouth daily.   ranitidine 150 MG tablet Commonly known as:  ZANTAC Take 1 tablet (150 mg total) by mouth 2 (two) times daily.   Vitamin D (Ergocalciferol) 50000 units Caps capsule Commonly known as:  DRISDOL Take 1 capsule by mouth once a week. Take on Tuesdays      Disposition and follow-up:   Barbara Hill [facility].  At the hospital follow up visit please address:  1.  Syncope - Has she had follow up with vestibular PT? Recheck orthostatic BP. Can any of her blood pressure medications be lowered? Reinforce hydration, compression stockings, and rising slowly from a seated position.   Ecoli bacteriuria- Check for signs symptoms of UTI Hypokalemia- recheck BMET  2.  Labs / imaging needed at time of follow-up: BMET   3.  Pending labs/ test needing follow-up: none  Follow-up Appointments: Follow-up Information    Dietrich PatesPaula Ross, MD. Call today.   Specialty:  Cardiology Why:  they will  call you will an appointment for sometime next week  Contact information: [address] [place] [address] [phone]        [facility] .   Specialty:  Rehabilitation Why:  you will receive a call Monday to schedule your Vestibular Rehab Contact information: [address] [id number] [place] [phone]          Hospital Course by problem list: Active Problems:   Syncope   Lumbar contusion   Orthostatic hypotension   First degree AV block    Hypokalemia   Barbara Hill is a 75 y.o. female with a PMH of supraventricular tachycardia, COPD, HTN, Mitral valve prolapse, anxiety who presents with dizzy spells. Over the past 2 months Barbara Hill has been experiencing dizzy spells when she stands up too quickly. The most recent was last Thursday when she was sleeping on the couch, she stood up quickly and the next thing she remembers is feeling her head hit the floor. The next morning she woke up with a headache and pain right over the area where she had hit her head, she also had pain in her buttocks and back to the point where she didn't want to get out of bed. Since that point she has continued to get dizzy spells when she bends over and then straightens back up. Cardiology had recently decreased her metoprolol and lisinopril prescriptions when she was found to have orthostatic hypotension in their office.   Syncope - Orthostatic hypotension 123/65 supine to 77/52 standing in the ED. CT head was negative for acute bleed. For the admission mlodipine and metoprolol were held and Flecainide 100 mg was reduced to 50 mg. Physical therapy evaluation revealed a need for vestibular PT.   Back pain- Xrays were negative for fracture or acute process. She had muscle spasm on exam and was given tylenol.   AKI  Slight elevation in SCr 1.02 found on admission. Chart review showed baseline SCr  0.8-1.02. Her SCr improved following IV fluid administration so it may have been related to dehydration.   Hypokalemia- K 3.1 found on admission so she was given potassium chloride 20 mEq.   Ecoli bacteriuria Urine cultures resulted with Ecoli after Barbara Hill had left the hospital. She had mentioned some increased urgency recently on admission. A follow up call was placed she denied any dysuria or fever and opted to monitor herself for any symptoms and contact her primary care doctor if she started to notice anything new.   Discharge Vitals:   BP (!) 178/69 (BP  Location: Right Arm)   Pulse 66   Temp 98.9 F (37.2 C) (Oral)   Resp 20   Ht 5\' 3"  (1.6 m)   Wt 119 lb 6.4 oz (54.2 kg) Comment: scale a  SpO2 100%   BMI 21.15 kg/m   Pertinent Labs, Studies, and Procedures: Potassium 8/18 3.4 > 8/19 2.8  EKG: 1st degree A-V block  Procedures Performed:  Dg Lumbar Spine Complete  Result Date: 04/10/2016 CLINICAL DATA:  Post fall last week with persistent generalized low back pain. EXAM: LUMBAR SPINE - COMPLETE 4+ VIEW COMPARISON:  CT abdomen pelvis - 02/15/2015 FINDINGS: There are 5 non rib-bearing lumbar type vertebral bodies. There is mild straightening of the expected lumbar lordosis. No anterolisthesis or retrolisthesis. No definite pars defects. Lumbar vertebral body heights are preserved. Mild to moderate multilevel lumbar spine DDD, worse at L2-L3 with disc space height loss, endplate  irregularity and sclerosis, similar to the 02/15/2015 examination. Limited visualization of the bilateral SI joints is normal. Calcified atherosclerotic plaque within the abdominal aorta. Post cholecystectomy. Regional bowel gas pattern is normal. IMPRESSION: 1. Mild straightening of the expected lumbar lordosis, nonspecific though could be seen in the setting of muscle spasm. Otherwise, no acute findings. 2. Mild-to-moderate multilevel lumbar spine DDD, worse at L2-L3, similar to the 01/2015 examination. 3.  Aortic Atherosclerosis (ICD10-170.0) Electronically Signed   By: Simonne Come M.D.   On: 04/10/2016 11:21   Ct Head Wo Contrast  Result Date: 04/10/2016 CLINICAL DATA:  Continued dizziness and multiple falls for the past 2 months. History hypertension. EXAM: CT HEAD WITHOUT CONTRAST TECHNIQUE: Contiguous axial images were obtained from the base of the skull through the vertex without intravenous contrast. COMPARISON:  None. FINDINGS: Brain: Mild, likely age-appropriate atrophy with sulcal prominence. Scattered minimal periventricular hypodensities compatible  microvascular ischemic disease. The gray-white differentiation is otherwise well maintained without CT evidence of acute large territory infarct. No intraparenchymal or extra-axial mass or hemorrhage. Normal size a configuration of the ventricles and basilar cisterns. No midline shift. Vascular: Intracranial atherosclerosis Skull: No displaced calvarial fracture. Sinuses/Orbits: Limited visualization the paranasal sinuses and mastoid air cells is normal. No air-fluid levels. Other: Regional soft tissues appear normal. IMPRESSION: Mild, likely age-appropriate atrophy and microvascular ischemic disease without acute intracranial process. Electronically Signed   By: Simonne Come M.D.   On: 04/10/2016 11:19   Mr Laqueta Jean ZD Contrast  Result Date: 04/10/2016 CLINICAL DATA:  Dizziness and multiple falls. Symptoms for 2 months. Otalgia. EXAM: MRI HEAD WITHOUT AND WITH CONTRAST TECHNIQUE: Multiplanar, multiecho pulse sequences of the brain and surrounding structures were obtained without and with intravenous contrast. CONTRAST:  10mL MULTIHANCE GADOBENATE DIMEGLUMINE 529 MG/ML IV SOLN COMPARISON:  04/10/2016. FINDINGS: No evidence for acute infarction, hemorrhage, mass lesion, hydrocephalus, or extra-axial fluid. Moderate cerebral and cerebellar atrophy. Mild to moderate subcortical and periventricular T2 and FLAIR hyperintensities, likely chronic microvascular ischemic change. Flow voids are maintained throughout the carotid, basilar, and vertebral arteries. There are no areas of chronic hemorrhage. Pituitary, pineal, and cerebellar tonsils unremarkable. No upper cervical lesions. Visualized calvarium, skull base, and upper cervical osseous structures unremarkable. Scalp and extracranial soft tissues, orbits, sinuses, and mastoids show no acute process. Post infusion, no abnormal enhancement of the brain or meninges. At the RIGHT MCA bifurcation, there appears to be a 6 mm saccular aneurysm, incompletely evaluated on non  MRA study. No Associated subarachnoid blood on CT nor on MR, the therefore likely unruptured. IMPRESSION: Mild to moderate atrophy and small vessel disease. No acute intracranial findings. 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 MRA intracranial could be performed after for 24 hours but cannot be performed RIGHT away due to intravascular blood pool of gadolinium. CTA intracranial could be performed at any time but is not necessarily indicated on emergent basis. Electronically Signed   By: Elsie Stain M.D.   On: 04/10/2016 17:39   Dg Hips Bilat With Pelvis 3-4 Views  Result Date: 04/09/2016 CLINICAL DATA:  Larey Seat 1 week ago, hip pain, both hips hurt EXAM: DG HIP (WITH OR WITHOUT PELVIS) 3-4V BILAT COMPARISON:  None FINDINGS: Osseous demineralization. Symmetric SI joints. RIGHT hip joint space fairly well preserved. LEFT hip joint space narrowing and spur formation. No acute fracture, dislocation, or bone destruction. Sacral foramina appear symmetric. Facet degenerative changes lower lumbar spine. Scattered atherosclerotic calcifications. IMPRESSION: Osseous demineralization with degenerative changes of LEFT hip joint. No definite  acute bony abnormalities. Electronically Signed   By: Ulyses Southward M.D.   On: 04/09/2016 15:49   2D Echo 04/13/16: Mild LVH with LVEF 60-65%. Grade 2 diastolic dysfunction with   increased LV filling pressure. Mild mitral regurgitation. Left   atrial enlargement. Mild aortic regurgitation. Trivial tricuspid   regurgitation.  Consultations: Cardiology  Treatment Team:  Hillis Range, MD  Discharge Instructions: Discharge Instructions    Call MD for:  difficulty breathing, headache or visual disturbances    Complete by:  As directed   Call MD for:  extreme fatigue    Complete by:  As directed   Call MD for:  persistant dizziness or light-headedness    Complete by:  As directed   Diet - low sodium heart healthy    Complete by:  As directed   Increase activity slowly     Complete by:  As directed      Signed: Eulah Pont, MD 04/13/2016, 2:27 PM   Pager: [phone]

## 2016-04-12 LAB — URINE CULTURE: Culture: 40000 — AB

## 2016-04-15 ENCOUNTER — Ambulatory Visit: Payer: Medicare HMO | Attending: Oncology | Admitting: Physical Therapy

## 2016-04-15 ENCOUNTER — Encounter: Payer: Self-pay | Admitting: Physical Therapy

## 2016-04-15 ENCOUNTER — Inpatient Hospital Stay: Admission: RE | Admit: 2016-04-15 | Payer: Self-pay | Source: Ambulatory Visit

## 2016-04-15 DIAGNOSIS — R2681 Unsteadiness on feet: Secondary | ICD-10-CM | POA: Insufficient documentation

## 2016-04-15 DIAGNOSIS — H8111 Benign paroxysmal vertigo, right ear: Secondary | ICD-10-CM | POA: Insufficient documentation

## 2016-04-15 DIAGNOSIS — R2689 Other abnormalities of gait and mobility: Secondary | ICD-10-CM | POA: Insufficient documentation

## 2016-04-15 DIAGNOSIS — R42 Dizziness and giddiness: Secondary | ICD-10-CM | POA: Diagnosis present

## 2016-04-15 NOTE — Therapy (Signed)
[facility] [address] [place], [place], [address] Phone: [phone]   Fax:  [phone]  Physical Therapy Evaluation  Patient Details  Name: Barbara Hill MRN: [id number] Date of Birth: 16-Mar-1941 Referring Provider: Richmond Campbell, PA-C (referred by hospitalist; pt requesting to send notes to PCP)  Encounter Date: 04/15/2016      PT End of Session - 04/15/16 1308    Visit Number 1   Number of Visits 7  eval + 6 visits   Date for PT Re-Evaluation 05/15/16   Authorization Type [employer] MCR   Authorization Time Period G Codes required   PT Start Time 1106   PT Stop Time 1157   PT Time Calculation (min) 51 min   Activity Tolerance Patient tolerated treatment well   Behavior During Therapy [facility] for tasks assessed/performed      Past Medical History:  Diagnosis Date  . Anxiety   . Arrhythmia    palps  . COPD (chronic obstructive pulmonary disease) (HCC)   . Dizziness   . Fatigue   . H/O: hysterectomy   . Headache(784.0)   . Hypertension   . Mitral valve prolapse   . Radiculopathy   . SVT (supraventricular tachycardia) (HCC)   . UTI (lower urinary tract infection)     Past Surgical History:  Procedure Laterality Date  . ABDOMINAL HYSTERECTOMY    . APPENDECTOMY    . CARDIAC CATHETERIZATION  12/95  . CHOLECYSTECTOMY    . ROTATOR CUFF REPAIR      There were no vitals filed for this visit.       Subjective Assessment - 04/15/16 1115    Subjective "I was lying on the couch, went to get up.Marland KitchenMarland KitchenI got twirled around and went backwards. My head hit the floor so bad. Barbara Hill wanted me to go to the hospital, but I didn't want to." Pt reports she had fallen 2 prior times prior to this. Pt was hospitalized from 8/18-8/19/17; determined to have orthostatic hypotension. PT also noted (+) R Head Thrust Test, per documentation.    Patient is accompained by: Family member  spouse, Barbara Hill   Pertinent History PMH  significant for: orthostatic hypotension, syncope, SVT, mitral valve disorder, anxiety, arrhythmia, COPD, lumbar contusion   Patient Stated Goals "I want to get back to normal...walking without feeling so dizzy."   Currently in Pain? No/denies            [facility] PT Assessment - 04/15/16 0001      Assessment   Medical Diagnosis orthostatic hypotension with dizziness   Referring Provider Barbara Campbell, PA-C  referred by hospitalist; pt requesting to send notes to PCP   Onset Date/Surgical Date 04/10/16     Precautions   Precautions Fall     Restrictions   Weight Bearing Restrictions No     Balance Screen   Has the patient fallen in the past 6 months Yes   How many times? 3   Has the patient had a decrease in activity level because of a fear of falling?  Yes   Is the patient reluctant to leave their home because of a fear of falling?  Yes     Home Environment   Living Environment Private residence   Living Arrangements Spouse/significant other   Type of Home House   Home Access Stairs to enter   Entrance Stairs-Number of Steps 2   Entrance Stairs-Rails None   Home Layout One level   Home Equipment [employer] -  2 wheels     Prior Function   Level of Independence Independent   Vocation Retired   Leisure Likes to listen to blue grass music     Cognition   Overall Cognitive Status Within Functional Limits for tasks assessed            Vestibular Assessment - 04/15/16 0001      Vestibular Assessment   General Observation Per acute PT assessment, (+) R Head Thrust Test during recent hospital admission.      Symptom Behavior   Type of Dizziness Imbalance   Frequency of Dizziness daily; every day since pt sustained fall   Duration of Dizziness seconds; constant while pt moving head/body   Aggravating Factors Turning head quickly;Turning body quickly;Supine to sit;Sit to stand   Relieving Factors Head stationary;Slow movements     Occulomotor Exam   Occulomotor  Alignment Normal   Spontaneous Absent   Gaze-induced Absent   Smooth Pursuits Intact   Saccades Intact   Comment (+) and symptomatic R Head Thrust Test     Vestibulo-Occular Reflex   VOR 1 Head Only (x 1 viewing) Difficulty maintaining gaze on visual target during VOR with horizontal head movement; reports blurred vision and dizziness   Comment Convergence appears WNL.     Positional Testing   Dix-Hallpike Dix-Hallpike Right   Sidelying Test Sidelying Right;Sidelying Left     Dix-Hallpike Right   Dix-Hallpike Right Duration Approx. 30 seconds   Dix-Hallpike Right Symptoms Upbeat, right rotatory nystagmus     Sidelying Right   Sidelying Right Duration Approx 25-30 seconds   Sidelying Right Symptoms Upbeat, right rotatory nystagmus     Sidelying Left   Sidelying Left Duration NA   Sidelying Left Symptoms No nystagmus               [facility] Adult PT Treatment/Exercise - 04/15/16 0001      Transfers   Transfers Sit to Stand;Stand to Sit   Sit to Stand 5: Supervision   Stand to Sit 5: Supervision     Ambulation/Gait   Ambulation/Gait Yes   Ambulation/Gait Assistance 4: Min guard;4: Min assist   Ambulation/Gait Assistance Details Required min guard-min A (single HHA of spouse/therapist) due to pt staggering to R/L during gait   Ambulation Distance (Feet) 160 Feet   Assistive device None   Gait Pattern --  minimal head movement; turns en bloc; staggering   Ambulation Surface Level;Indoor   Gait Comments Noted improved gait stability following treatment today; however, pt did require close (S) to min guard for linear gait, even post-session.         Vestibular Treatment/Exercise - 04/15/16 0001      Vestibular Treatment/Exercise   Vestibular Treatment Provided Canalith Repositioning   Canalith Repositioning Epley Manuever Right      EPLEY MANUEVER RIGHT   Number of Reps  1   Overall Response Improved Symptoms   Response Details  Following R Epley, reassessment of  R Sidelying Test reveals no nystagmus, asymptomatic               PT Education - 04/15/16 1306    Education provided Yes   Education Details PT eval findings, goals, and POC. Explained BPPV, treatment, and what to expect after this session. Per pt/spouse questions, explained postural hypotension, including prevention/management (compression garments, postural adjustment) and strategies for fall prevention.   Person(s) Educated Patient;Spouse   Methods Explanation;Demonstration   Comprehension Verbalized understanding  PT Short Term Goals - 04/15/16 1738      PT SHORT TERM GOAL #1   Title STG's = LTG's           PT Long Term Goals - 04/15/16 1738      PT LONG TERM GOAL #1   Title Positional vertigo testing will be negative to indicate resolved BPPV.   (Target: 05/13/16)     PT LONG TERM GOAL #2   Title Pt will independently perform vestibular HEP to maximize functional gains made in PT.  (05/13/16)     PT LONG TERM GOAL #3   Title Pt will decrease DHI score from 60 to < / = 50 to indicate significant decrease in pt-perceived disability due to dizziness.  (05/13/16)     PT LONG TERM GOAL #4   Title Assess strength and balance, if indicated, after vertigo clears.  (05/13/16)               Plan - 04/15/16 1309    Clinical Impression Statement Pt is a 75 y/o F referred to vestibular PT to address functional impairments associated with dizziness, orthostatic hypotension. PMH significant for: orthostatic hypotension, syncope, SVT, mitral valve disorder, anxiety, arrhythmia, COPD, lumbar contusion. PT evaluation reveals the following: gait instability; R Dix-Hallpike with R upbeating, torsional nystagmus x30 seconds; (+) and symptomatic R Head Thrust Test. Based on findings, unable to rule out R posterior canalithiasis, vestibular hypofunction. Treated with R Epley Maneuver x1, after which R Sidelying Test was (-) and asymptomatic. Pt will benefit from skilled  vestibular PT 2x/week for 2 weeks followed by 1x/week for 2 weeks to address said impairments.     Rehab Potential Good   PT Frequency Other (comment)  2x/week for 2 weeks, then 1x/week for 2 weeks   PT Duration Other (comment)  see above   PT Treatment/Interventions ADLs/Self Care Home Management;Canalith Repostioning;DME Instruction;Gait training;Stair training;Patient/family education;Therapeutic activities;Neuromuscular re-education;Functional mobility training;Balance training;Therapeutic exercise;Manual techniques;Vestibular   PT Next Visit Plan Reassess for BPPV (R PC) and treat prn. Initiate HEP for motion sensitivity and gaze, if indicated. [facility] out [facility].   Consulted and Agree with Plan of Care Patient;Family member/caregiver   Family Member Consulted spouse, Bethann BerkshireJohnny      Patient will benefit from skilled therapeutic intervention in order to improve the following deficits and impairments:  Abnormal gait, Decreased balance, Dizziness  Visit Diagnosis: BPPV (benign paroxysmal positional vertigo), right - Plan: PT plan of care cert/re-cert  Dizziness and giddiness - Plan: PT plan of care cert/re-cert  Other abnormalities of gait and mobility - Plan: PT plan of care cert/re-cert  Unsteadiness on feet - Plan: PT plan of care cert/re-cert      G-Codes - 04/15/16 1741    Functional Assessment Tool Used DHI = 68   Functional Limitation Self care   Self Care Current Status (I6270(G8987) At least 60 percent but less than 80 percent impaired, limited or restricted   Self Care Goal Status (J5009(G8988) At least 40 percent but less than 60 percent impaired, limited or restricted       Problem List Patient Active Problem List   Diagnosis Date Noted  . Lumbar contusion   . Orthostatic hypotension   . First degree AV block   . Hypokalemia   . Syncope 04/10/2016  . Paroxysmal SVT (supraventricular tachycardia) (HCC) 04/21/2011  . 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 Tobacco use disorder  04/21/2011    Jorje GuildBlair Nastashia Gallo, PT, DPT [facility] [phone]  [address] [place], [place], [address] Phone: [phone]   Fax:  [phone] 04/15/16, 5:46 PM  Name: Barbara Hill MRN: [id number] Date of Birth: 06/22/1941

## 2016-04-15 NOTE — Progress Notes (Signed)
]      ROS ] 

## 2016-04-16 ENCOUNTER — Ambulatory Visit (INDEPENDENT_AMBULATORY_CARE_PROVIDER_SITE_OTHER): Payer: Medicare HMO | Admitting: Physician Assistant

## 2016-04-16 ENCOUNTER — Encounter: Payer: Self-pay | Admitting: Physician Assistant

## 2016-04-16 VITALS — BP 136/78 | HR 67 | Ht 63.0 in | Wt 115.0 lb

## 2016-04-16 DIAGNOSIS — I471 Supraventricular tachycardia: Secondary | ICD-10-CM

## 2016-04-16 DIAGNOSIS — E876 Hypokalemia: Secondary | ICD-10-CM

## 2016-04-16 DIAGNOSIS — I10 Essential (primary) hypertension: Secondary | ICD-10-CM

## 2016-04-16 DIAGNOSIS — M5136 Other intervertebral disc degeneration, lumbar region: Secondary | ICD-10-CM

## 2016-04-16 DIAGNOSIS — R0600 Dyspnea, unspecified: Secondary | ICD-10-CM

## 2016-04-16 DIAGNOSIS — I951 Orthostatic hypotension: Secondary | ICD-10-CM | POA: Diagnosis not present

## 2016-04-16 MED ORDER — AMLODIPINE BESYLATE 10 MG PO TABS: 10.0000 mg | ORAL_TABLET | Freq: Every day | ORAL | 6 refills | Status: DC

## 2016-04-16 NOTE — Patient Instructions (Signed)
Medication Instructions:   START TAKING AMLODIPINE 10 MG ONCE A DAY   If you need a refill on your cardiac medications before your next appointment, please call your pharmacy.  Labwork: NONE ORDER TODAY    Testing/Procedures: NONE ORDER TODAY    Follow-Up:  WITH DR ROSS IN 3  MONTHS    Any Other Special Instructions Will Be Listed Below (If Applicable).

## 2016-04-16 NOTE — Progress Notes (Signed)
Cardiology Office Note    Date:  04/16/2016   ID:  Barbara Hill, DOB 04/03/1941, MRN [id number]  PCP:  Lilia ArgueKAPLAN,KRISTEN, PA-C  Cardiologist:  Dr. Dietrich PatesPaula Ross   Electrophysiologist: Dr. Johney FrameAllred  Chief Complaint: Hospital follow up   History of Present Illness:   Barbara Hill is a 75 y.o. female with a hx of SVT, palpitations, dizziness, COPD, HTN and recent admission for syncope who presented for hospital follow up.   She has had prior SVT for which she was placed on flecainide by Dr Alanda AmassWeintraub.  She has done well for years without sustained events. She was recently seen in the cardiology clinic 03/31/16 by Tereso NewcomerScott Weaver for dizziness and noted to be orthostatic intolerance.  ENT did not felt vertigo. Her medicines were adjusted at that time. Dyspnea felt related to tobacco abuse.   She presented to ER 04/10/16 with syncope and found to be profound orthostasis. She was demonstrated to be markedly dehydrated upon initial evaluation. The patient was seen by Dr. Johney FrameAllred who recommended IV hydration and support stocking. She is hypertensive typically and did not recommended midodrine or florinef. Typically dizziness associated with flecainide is not associated with orthostasis.  If however despite optimization of volume she remains orthostatic, would consider stopping flecainide at that time. 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 Also treated for Ecoli Bacteriuria. Her symptoms improved with IV hydration and discharged.   Today she presented for follow up. Yesterday she had vestibular PT session. Initially she felt dizziness but her symptoms improved later. No syncope, chest pain, palpitations, melena, blood in his stool or urine.    Past Medical History:  Diagnosis Date  . Anxiety   . Arrhythmia    palps  . COPD (chronic obstructive pulmonary disease) (HCC)   . Dizziness   . Fatigue   . H/O: hysterectomy   . Headache(784.0)   .  Hypertension   . Mitral valve prolapse   . Radiculopathy   . SVT (supraventricular tachycardia) (HCC)   . UTI (lower urinary tract infection)     Past Surgical History:  Procedure Laterality Date  . ABDOMINAL HYSTERECTOMY    . APPENDECTOMY    . CARDIAC CATHETERIZATION  12/95  . CHOLECYSTECTOMY    . ROTATOR CUFF REPAIR      Current Medications: Prior to Admission medications   Medication Sig Start Date End Date Taking? Authorizing Provider  amLODipine (NORVASC) 10 MG tablet Take 10 mg by mouth daily. Pt takes 5 mg daily   Yes Historical Provider, MD  acetaminophen (TYLENOL) 325 MG tablet Take 650 mg by mouth every 6 (six) hours as needed for mild pain, moderate pain or headache.    Historical Provider, MD  acidophilus (RISAQUAD) CAPS capsule Take 1 capsule by mouth daily.    Historical Provider, MD  ALPRAZolam [name](XANAX) 0.5 MG tablet Take 0.5 mg by mouth 3 (three) times daily.     Historical Provider, MD  butalbital-acetaminophen-caffeine (FIORICET, ESGIC) 50-325-40 MG per tablet Take 1 tablet by mouth every 4 (four) hours as needed for headache or migraine.  07/06/14   Historical Provider, MD  fexofenadine-pseudoephedrine (ALLEGRA-D) 60-120 MG 12 hr tablet Take 1 tablet by mouth daily.    Historical Provider, MD  flecainide (TAMBOCOR) 100 MG tablet Take 1 tablet (100 mg total) by mouth 2 (two) times daily. 03/31/16   Beatrice LecherScott T Weaver, PA-C  FLUoxetine (PROZAC) 40 MG capsule Take 20 mg by mouth 2 (two) times  daily.     Historical Provider, MD  fluticasone (FLONASE) 50 MCG/ACT nasal spray Place 1 spray into both nostrils daily as needed for allergies or rhinitis.  10/11/14   Historical Provider, MD  lisinopril (PRINIVIL,ZESTRIL) 10 MG tablet Take 1 tablet (10 mg total) by mouth daily. 03/31/16   Beatrice Lecher, PA-C  meclizine (ANTIVERT) 25 MG tablet Take 12.5-25 mg by mouth 2 (two) times daily as needed for dizziness or nausea.  04/04/14   Historical Provider, MD  metoprolol succinate (TOPROL-XL)  25 MG 24 hr tablet TAKE 1 TABLET BY MOUTH EVERY NIGHT AT BEDTIME 04/01/16   Beatrice Lecher, PA-C  Multiple Vitamins-Minerals (MULTIVITAMIN PO) Take 1 tablet by mouth daily.    Historical Provider, MD  omeprazole (PRILOSEC) 40 MG capsule Take 40 mg by mouth daily.      Historical Provider, MD  Vitamin D, Ergocalciferol, (DRISDOL) 50000 UNITS CAPS capsule Take 1 capsule by mouth once a week. Take on Tuesdays 10/29/14   Historical Provider, MD    Allergies:   Dobutamine; Epinephrine; Darvocet [propoxyphene n-acetaminophen]; Excedrin extra strength [aspirin-acetaminophen-caffeine]; Propoxyphene; Clarithromycin; Clarithromycin; Doxycycline; Penicillins; and Prednisone   Social History   Social History  . Marital status: Widowed    Spouse name: N/A  . Number of children: N/A  . Years of education: N/A   Social History Main Topics  . Smoking status: Current Every Day Smoker  . Smokeless tobacco: Current User  . Alcohol use No  . Drug use: Unknown  . Sexual activity: Not on file   Other Topics Concern  . Not on file   Social History Narrative  . No narrative on file     Family History:  The patient's family history includes Asthma in her other; Heart Problems in her brother; Heart attack in her mother and other; Heart attack (age of onset: 67) in her father; Heart failure in her other; Osteoporosis in her other; Transient ischemic attack in her mother.   ROS:   Please see the history of present illness.    ROS All other systems reviewed and are negative.   PHYSICAL EXAM:   VS:  There were no vitals taken for this visit.   GEN: Well nourished, well developed, in no acute distress  HEENT: normal  Neck: no JVD, carotid bruits, or masses Cardiac: RRR; no murmurs, rubs, or gallops,no edema  Respiratory:  clear to auscultation bilaterally, normal work of breathing GI: soft, nontender, nondistended, + BS MS: no deformity or atrophy  Skin: warm and dry, no rash Neuro:  Alert and Oriented x  3, Strength and sensation are intact Psych: euthymic mood, full affect  Wt Readings from Last 3 Encounters:  04/11/16 119 lb 6.4 oz (54.2 kg)  04/09/16 117 lb (53.1 kg)  03/31/16 117 lb 12.8 oz (53.4 kg)      Studies/Labs Reviewed:   EKG:  EKG is not ordered today.    Recent Labs: 03/31/2016: Brain Natriuretic Peptide 439.9; TSH 1.07 04/10/2016: ALT 7 04/11/2016: BUN <5; Creatinine, Ser 0.73; Hemoglobin 9.7; Magnesium 1.3; Platelets 311; Potassium 2.8; Sodium 139   Lipid Panel    Component Value Date/Time   CHOL 199 07/21/2013 1456   TRIG 104.0 07/21/2013 1456   HDL 63.60 07/21/2013 1456   CHOLHDL 3 07/21/2013 1456   VLDL 20.8 07/21/2013 1456   LDLCALC 115 (H) 07/21/2013 1456    Additional studies/ records that were reviewed today include:   Echocardiogram: 04/11/16 LV EF: 60% -   65%  -------------------------------------------------------------------  Indications:      Syncope 780.2.  ------------------------------------------------------------------- History:   PMH:  Supraventricular Tachycardia.  Mitral valve prolapse.  Chronic obstructive pulmonary disease.  Risk factors: Hypertension.  ------------------------------------------------------------------- Study Conclusions  - Left ventricle: The cavity size was normal. Wall thickness was   increased in a pattern of mild LVH. Systolic function was normal.   The estimated ejection fraction was in the range of 60% to 65%.   Wall motion was normal; there were no regional wall motion   abnormalities. Features are consistent with a pseudonormal left   ventricular filling pattern, with concomitant abnormal relaxation   and increased filling pressure (grade 2 diastolic dysfunction). - Aortic valve: There was mild regurgitation. - Mitral valve: There was mild regurgitation. - Left atrium: The atrium was mildly dilated. - Right atrium: Central venous pressure (est): 3 mm Hg. - Tricuspid valve: There was trivial  regurgitation. - Pulmonary arteries: Systolic pressure could not be accurately   estimated. - Pericardium, extracardiac: There was no pericardial effusion.  Impressions:  - Mild LVH with LVEF 60-65%. Grade 2 diastolic dysfunction with   increased LV filling pressure. Mild mitral regurgitation. Left   atrial enlargement. Mild aortic regurgitation. Trivial tricuspid   regurgitation.    ASSESSMENT & PLAN:   1. Orthostatic hypotension with dizziness - leading to recent syncope. Symptoms improved with IV hydration during admission. Continue vestibular PT as dizziness improved. Continue stocking.   2.  SVT - Could consider stopping flecainide if persistent dizziness as advised by Dr. Johney FrameAllred  3. Dyspnea - Advised smoking cessation. She is willing to quit. Will let us know if required medication assistant.  Echo 04/11/16 showed LV Ef of 60-65%,  Mild LVH with LVEF 60-65%. Grade 2 diastolic dysfunction with ncreased LV filling pressure. Mild mitral regurgitation. Left atrial enlargement. Mild aortic regurgitation. Trivial tricuspid regurgitation. Consider Myoview if ongoing dyspnea. No chest pain.   4. Incidental finding of 6 mm right MCA bifurcation aneurysm. - Per PCP  5. Multilevel lumber DDD - Per PCP  6. Hypokalemia - During admission. She states that she will follow up BMET during PCP office next week.   7. HTN - Stable. Continue current regimen.   Medication Adjustments/Labs and Tests Ordered: Current medicines are reviewed at length with the patient today.  Concerns regarding medicines are outlined above.  Medication changes, Labs and Tests ordered today are listed in the Patient Instructions below. There are no Patient Instructions on file for this visit.   Lorelei PontSigned, Yuliza Cara, [place]PA  04/16/2016 10:35 AM    [facility] [address], [place], [place]  [address] Phone: [phone]; Fax: [phone]

## 2016-04-20 ENCOUNTER — Ambulatory Visit: Payer: Medicare HMO | Admitting: Physical Therapy

## 2016-04-20 DIAGNOSIS — H8111 Benign paroxysmal vertigo, right ear: Secondary | ICD-10-CM | POA: Diagnosis not present

## 2016-04-20 DIAGNOSIS — R2689 Other abnormalities of gait and mobility: Secondary | ICD-10-CM

## 2016-04-20 NOTE — Therapy (Signed)
[facility] [address] [place], [place], [address] Phone: [phone]   Fax:  [phone]  Physical Therapy Treatment and Discharge Summary  Patient Details  Name: Barbara Hill MRN: [id number] Date of Birth: 04/02/1941 Referring Provider: Aletha Halim, PA-C (referred by hospitalist; pt requesting to send notes to PCP)  Encounter Date: 04/20/2016      PT End of Session - 04/20/16 0907    Visit Number 2   Number of Visits 7   Date for PT Re-Evaluation 05/15/16   Authorization Type [employer] [facility]   Authorization Time Period G Codes required   PT Start Time 0815   PT Stop Time 0853   PT Time Calculation (min) 38 min   Activity Tolerance Patient tolerated treatment well   Behavior During Therapy [facility] for tasks assessed/performed      Past Medical History:  Diagnosis Date  . Anxiety   . Arrhythmia    palps  . COPD (chronic obstructive pulmonary disease) ([place])   . Dizziness   . Fatigue   . H/O: hysterectomy   . Headache(784.0)   . Hypertension   . Mitral valve prolapse   . Radiculopathy   . SVT (supraventricular tachycardia) ([place])   . UTI (lower urinary tract infection)     Past Surgical History:  Procedure Laterality Date  . ABDOMINAL HYSTERECTOMY    . APPENDECTOMY    . CARDIAC CATHETERIZATION  12/95  . CHOLECYSTECTOMY    . ROTATOR CUFF REPAIR      There were no vitals filed for this visit.      Subjective Assessment - 04/20/16 0818    Subjective "I feel a hundred percent better. I can't tell you how much this has helped me." Upon further questioning, pt reports "a few dizzy spells" over the weekend, which pt attributes to having "gotten up too fast". Pt saw cardiologist Friday. Pt reports provider recently made her aware of incidental finding of cerebral aneurysm. Pt with many questions about this.   Patient is accompained by: Family member  spouse, [name]   Pertinent History PMH significant for:  orthostatic hypotension, syncope, SVT, mitral valve disorder, anxiety, arrhythmia, COPD, lumbar contusion   Patient Stated Goals "I want to get back to normal...walking without feeling so dizzy."   Currently in Pain? No/denies                Vestibular Assessment - 04/20/16 0001      Occulomotor Exam   Comment (+) R Head Thrust Test     Positional Testing   Dix-Hallpike Dix-Hallpike Right   Sidelying Test Sidelying Right     Dix-Hallpike Right   Dix-Hallpike Right Duration NA   Dix-Hallpike Right Symptoms No nystagmus     Sidelying Right   Sidelying Right Duration NA   Sidelying Right Symptoms No nystagmus     Sidelying Left   Sidelying Left Duration NA   Sidelying Left Symptoms No nystagmus                 [facility] Adult PT Treatment/Exercise - 04/20/16 0001      Transfers   Transfers Sit to Stand;Stand to Sit   Sit to Stand 6: Modified independent (Device/Increase time)   Stand to Sit 6: Modified independent (Device/Increase time)     Ambulation/Gait   Ambulation/Gait Yes   Ambulation/Gait Assistance 6: Modified independent (Device/Increase time)   Ambulation Distance (Feet) 100 Feet  x2   Assistive device None   Gait Pattern  Within Functional Limits   Ambulation Surface Level;Indoor         Vestibular Treatment/Exercise - 04/20/16 0001      Vestibular Treatment/Exercise   Vestibular Treatment Provided Habituation;Gaze   Habituation Exercises Nestor Lewandowsky   Gaze Exercises X1 Viewing Horizontal;X1 Viewing Vertical     Nestor Lewandowsky   Number of Reps  2   Symptom Description  With PT verbal/demo cueing, pt gave effective return demo of Nestor Lewandowsky for habituation.      X1 Viewing Horizontal   Foot Position standing; feet shoulder width apart   Reps 2   Comments 3 x30-second trials with cueing for technique, speed/amplitude of head movement     X1 Viewing Vertical   Foot Position standing; feet shoulder width apart   Reps 1   Comments  30 seconds; cueing as described above.            Balance Exercises - 04/20/16 0902      Balance Exercises: Standing   Standing Eyes Closed Wide (BOA);Head turns;Foam/compliant surface;5 reps;Other (comment)  1 pillow; horiz, vert head turns w/o postural instability           PT Education - 04/20/16 0905    Education provided Yes   Education Details PT goals, findings, progress, and DC plan. HEP for gaze stabilization. Educated pt on safe self-management of BPPV in future. Emphasized situations in which to seek immediate medical attention rather than attempting to self-manage. To address pt questions regarding incidental finding of cerebral aneurysm, recommended pt contact MD who ordered imaging to discuss if further workup is indicated.    Person(s) Educated Patient;Spouse   Methods Explanation;Demonstration;Handout;Verbal cues   Comprehension Verbalized understanding;Returned demonstration          PT Short Term Goals - 04/15/16 1738      PT SHORT TERM GOAL #1   Title STG's = LTG's           PT Long Term Goals - 04/20/16 0908      PT LONG TERM GOAL #1   Title Positional vertigo testing will be negative to indicate resolved BPPV.   (Target: 05/13/16)   Baseline Met 8/28.   Status Achieved     PT LONG TERM GOAL #2   Title Pt will independently perform vestibular HEP to maximize functional gains made in PT.  (05/13/16)   Baseline Met 8/28.   Status Achieved     PT LONG TERM GOAL #3   Title Pt will decrease DHI score from 60 to < / = 50 to indicate significant decrease in pt-perceived disability due to dizziness.  (05/13/16)   Baseline 8/28: DHI score = 18   Status Achieved     PT LONG TERM GOAL #4   Title Assess strength and balance, if indicated, after vertigo clears.  (05/13/16)   Baseline Met 8/28.   Status Achieved               Plan - 04/20/16 0908    Clinical Impression Statement Positional testing (-) and asymptomatic and all goals met. Pt  reports no further dizziness or disequilibrium and exhibits significantly improved gait stability. Pt will therefore be discharged from outpatient PT at this time. Pt/spouse verbalized understanding and were in full agreement with DC plan.    Consulted and Agree with Plan of Care Patient;Family member/caregiver   Family Member Consulted spouse, Charlotte Crumb      Patient will benefit from skilled therapeutic intervention in order to improve the following deficits and  impairments:     Visit Diagnosis: Other abnormalities of gait and mobility       G-Codes - 05/07/16 0900    Functional Assessment Tool Used DHI = 18   Functional Limitation Self care   Self Care Goal Status (D0301) At least 40 percent but less than 60 percent impaired, limited or restricted   Self Care Discharge Status [phone]) At least 1 percent but less than 20 percent impaired, limited or restricted      Problem List Patient Active Problem List   Diagnosis Date Noted  . Lumbar contusion   . Orthostatic hypotension   . First degree AV block   . Hypokalemia   . Syncope 04/10/2016  . Paroxysmal SVT (supraventricular tachycardia) ([place]) 08-May-2011  . Mitral valve disorder May 08, 2011  . Tobacco use disorder 05-08-11    PHYSICAL THERAPY DISCHARGE SUMMARY  Visits from Start of Care: 2  Current functional level related to goals / functional outcomes: See above.   Remaining deficits: See above. (+) Head Thrust Test, impaired VOR   Education / Equipment: See Pt Instructions above.  Plan: Patient agrees to discharge.  Patient goals were met. Patient is being discharged due to meeting the stated rehab goals.  ?????        Billie Ruddy, PT, DPT [facility] [address] [place], [place], [address] Phone: [phone]   Fax:  [phone] 07-May-2016, 9:11 AM  Name: Barbara Hill MRN: [id number] Date of Birth: 18-Mar-1941

## 2016-04-20 NOTE — Patient Instructions (Signed)
  Gaze Stabilization: Tip Card 1.Target must remain in focus, not blurry, and appear stationary while head is in motion. 2.Perform exercises with small head movements (45 to either side of midline). 3.Increase speed of head motion so long as target is in focus. 4.If you wear eyeglasses, be sure you can see target through lens (therapist will give specific instructions for bifocal / progressive lenses). 5.These exercises may provoke dizziness or nausea. Work through these symptoms. If too dizzy, slow head movement slightly. Rest between each exercise. 6.Exercises demand concentration; avoid distractions. 7.For safety, perform standing exercises close to a counter, wall, corner, or next to someone.  Copyright  VHI. All rights reserved.  Gaze Stabilization: Standing Feet Apart   Feet shoulder width apart, keeping eyes on target ("A") on wall 3 feet away, tilt head down slightly and move head side to side for 30 seconds. Repeat while moving head up and down for 30 seconds. Do 2 sessions per day.   Steward DroneBrenda, you can try this exercise if you feel like you have positional vertigo in the future. If you ever experience vertigo that is constant, occurs at rest, or does not change with head/body movement (getting in and out of bed), seek immediate medical attention.  Tip Card 1.The goal of habituation training is to assist in decreasing symptoms of vertigo, dizziness, or nausea provoked by specific head and body motions. 2.These exercises may initially increase symptoms; however, be persistent and work through symptoms. With repetition and time, the exercises will assist in reducing or eliminating symptoms. 3.Exercises should be stopped and discussed with the therapist if you experience any of the following: - Sudden change or fluctuation in hearing - New onset of ringing in the ears, or increase in current intensity - Any fluid discharge from the ear - Severe pain in neck or back - Extreme nausea      Sit to Side-Lying   Sit on edge of bed. Lie down onto the right side and hold until dizziness stops, plus 20 seconds.  Return to sitting and wait until dizziness stops, plus 20 seconds.  Repeat to the left side. Repeat sequence 5 times per session. Do 2 sessions per day. Copyright  VHI. All rights reserved.

## 2016-04-23 ENCOUNTER — Other Ambulatory Visit: Payer: Self-pay | Admitting: Family Medicine

## 2016-04-23 DIAGNOSIS — I671 Cerebral aneurysm, nonruptured: Secondary | ICD-10-CM

## 2016-04-23 DIAGNOSIS — R519 Headache, unspecified: Secondary | ICD-10-CM

## 2016-04-23 DIAGNOSIS — R51 Headache: Secondary | ICD-10-CM

## 2016-04-23 DIAGNOSIS — R42 Dizziness and giddiness: Secondary | ICD-10-CM

## 2016-04-24 ENCOUNTER — Encounter: Payer: Self-pay | Admitting: Physical Therapy

## 2016-04-30 ENCOUNTER — Telehealth: Payer: Self-pay | Admitting: Physician Assistant

## 2016-04-30 NOTE — Telephone Encounter (Signed)
New message     Pt c/o medication issue:  1. Name of Medication: amlodipine   2. How are you currently taking this medication (dosage and times per day)? 10mg    1x day  3. Are you having a reaction (difficulty breathing--STAT)? No  4. What is your medication issue? Pt verbalized that its dropping her bp to 80/50

## 2016-05-01 MED ORDER — AMLODIPINE BESYLATE 10 MG PO TABS: 10.0000 mg | ORAL_TABLET | Freq: Every day | ORAL | 6 refills | Status: DC

## 2016-05-01 NOTE — Telephone Encounter (Signed)
Patient reports feeling much better today.  BP better.  She cut her amlodipine in half to 5 mg once a day.  She will continue to record her BPs and let us know if it gets too high or continues to be low.  She is appreciative for the call back.  Forwarding to Dr. Tenny Crawoss to inform.   She is scheduled in clinic on 07/24/16.

## 2016-05-06 ENCOUNTER — Ambulatory Visit
Admission: RE | Admit: 2016-05-06 | Discharge: 2016-05-06 | Disposition: A | Discharge status: Home / Self Care | Payer: Medicare HMO | Source: Ambulatory Visit | Attending: Family Medicine | Admitting: Family Medicine

## 2016-05-06 ENCOUNTER — Other Ambulatory Visit (HOSPITAL_COMMUNITY): Payer: Self-pay

## 2016-05-06 ENCOUNTER — Telehealth: Payer: Self-pay | Admitting: Internal Medicine

## 2016-05-06 ENCOUNTER — Ambulatory Visit: Payer: Medicare HMO | Admitting: Rehabilitative and Restorative Service Providers"

## 2016-05-06 DIAGNOSIS — R51 Headache: Secondary | ICD-10-CM

## 2016-05-06 DIAGNOSIS — R519 Headache, unspecified: Secondary | ICD-10-CM

## 2016-05-06 DIAGNOSIS — R42 Dizziness and giddiness: Secondary | ICD-10-CM

## 2016-05-06 DIAGNOSIS — I671 Cerebral aneurysm, nonruptured: Secondary | ICD-10-CM

## 2016-05-06 MED ORDER — GADOBENATE DIMEGLUMINE 529 MG/ML IV SOLN
10.0000 mL | Freq: Once | INTRAVENOUS | Status: AC | PRN
  Administered 2016-05-06: 10 mL via INTRAVENOUS

## 2016-05-06 NOTE — Telephone Encounter (Signed)
New message ° ° ° ° ° ° °Pt returning nurse call  °

## 2016-05-07 NOTE — Telephone Encounter (Signed)
Called patient to see if she needed anything, I did not call her yesterday but received this message.   Patient stated she was returning a call to [phone]. [facility].

## 2016-05-11 ENCOUNTER — Ambulatory Visit: Payer: Self-pay | Admitting: Physician Assistant

## 2016-05-11 ENCOUNTER — Encounter: Payer: Self-pay | Admitting: Physical Therapy

## 2016-05-13 ENCOUNTER — Encounter: Payer: Self-pay | Admitting: Physical Therapy

## 2016-05-18 ENCOUNTER — Encounter: Payer: Self-pay | Admitting: Physical Therapy

## 2016-05-25 ENCOUNTER — Encounter: Payer: Self-pay | Admitting: Physical Therapy

## 2016-05-27 ENCOUNTER — Ambulatory Visit (INDEPENDENT_AMBULATORY_CARE_PROVIDER_SITE_OTHER): Payer: Medicare HMO | Admitting: Neurology

## 2016-05-27 ENCOUNTER — Encounter: Payer: Self-pay | Admitting: Neurology

## 2016-05-27 VITALS — BP 127/67 | HR 61 | Ht 63.0 in | Wt 115.4 lb

## 2016-05-27 DIAGNOSIS — R42 Dizziness and giddiness: Secondary | ICD-10-CM

## 2016-05-27 DIAGNOSIS — I671 Cerebral aneurysm, nonruptured: Secondary | ICD-10-CM

## 2016-05-27 NOTE — Patient Instructions (Addendum)
Remember to drink plenty of fluid, eat healthy meals and do not skip any meals. Try to eat protein with a every meal and eat a healthy snack such as fruit or nuts in between meals. Try to keep a regular sleep-wake schedule and try to exercise daily, particularly in the form of walking, 20-30 minutes a day, if you can.   As far as diagnostic testing: Referral for aneurysm  I would like to see you back as needed, sooner if we need to. Please call us with any interim questions, concerns, problems, updates or refill requests.   Our phone number is [phone]. We also have an after hours call service for urgent matters and there is a physician on-call for urgent questions. For any emergencies you know to call 911 or go to the nearest emergency room  Orthostatic Hypotension Orthostatic hypotension is a sudden drop in blood pressure. It happens when you quickly stand up from a seated or lying position. You may feel dizzy or light-headed. This can last for just a few seconds or for up to a few minutes. It is usually not a serious problem. However, if this happens frequently or gets worse, it can be a sign of something more serious. CAUSES  Different things can cause orthostatic hypotension, including:   Loss of body fluids (dehydration).  Medicines that lower blood pressure.  Sudden changes in posture, such as standing up quickly after you have been sitting or lying down.  Taking too much of your medicine. SIGNS AND SYMPTOMS   Light-headedness or dizziness.   Fainting or near-fainting.   A fast heart rate.   Weakness.   Feeling tired (fatigue).  DIAGNOSIS  Your health care provider may do several things to help diagnose your condition and identify the cause. These may include:   Taking a medical history and doing a physical exam.  Checking your blood pressure. Your health care provider will check your blood pressure when you are:  Lying down.  Sitting.  Standing.  Using  tilt table testing. In this test, you lie down on a table that moves from a lying position to a standing position. You will be strapped onto the table. This test monitors your blood pressure and heart rate when you are in different positions. TREATMENT  Treatment will vary depending on the cause. Possible treatments include:   Changing the dosage of your medicines.  Wearing compression stockings on your lower legs.  Standing up slowly after sitting or lying down.  Eating more salt.  Eating frequent, small meals.  In some cases, getting IV fluids.  Taking medicine to enhance fluid retention. HOME CARE INSTRUCTIONS  Only take over-the-counter or prescription medicines as directed by your health care provider.  Follow your health care provider's instructions for changing the dosage of your current medicines.  Do not stop or adjust your medicine on your own.  Stand up slowly after sitting or lying down. This allows your body to adjust to the different position.  Wear compression stockings as directed.  Eat extra salt as directed.  Do not add extra salt to your diet unless directed to by your health care provider.  Eat frequent, small meals.  Avoid standing suddenly after eating.  Avoid hot showers or excessive heat as directed by your health care provider.  Keep all follow-up appointments. SEEK MEDICAL CARE IF:  You continue to feel dizzy or light-headed after standing.  You feel groggy or confused.  You feel cold, clammy, or sick to your  stomach (nauseous).  You have blurred vision.  You feel short of breath. SEEK IMMEDIATE MEDICAL CARE IF:   You faint after standing.  You have chest pain.  You have difficulty breathing.   You lose feeling or movement in your arms or legs.   You have slurred speech or difficulty talking, or you are unable to talk.  MAKE SURE YOU:   Understand these instructions.  Will watch your condition.  Will get help right  away if you are not doing well or get worse.   This information is not intended to replace advice given to you by your health care provider. Make sure you discuss any questions you have with your health care provider.   Document Released: 07/31/2002 Document Revised: 08/15/2013 Document Reviewed: 06/02/2013 Elsevier Interactive Patient Education 2016 [employer]    Cerebral Aneurysm An aneurysm is the bulging or ballooning out of part of the weakened wall of a vein or artery. An aneurysm in the vein or artery of the brain is called a brain aneurysm, or cerebral aneurysm.  Aneurysms are a risk to your health because they may leak or rupture. Once the aneurysm leaks or ruptures, bleeding occurs. If the bleeding occurs within the brain tissue, the condition is called an intracerebral hemorrhage. An intracerebral hemorrhage can result in a hemorrhagic stroke. If the bleeding occurs in the area between the brain and the thin tissues that cover the brain, the condition is called a subarachnoid hemorrhage. This increases the pressure on the brain and causes some areas of the brain to not get the necessary blood flow. The blood from the ruptured aneurysm collects and presses on the surrounding brain tissue. A subarachnoid hemorrhage can cause a stroke. A ruptured cerebral aneurysm is a medical emergency. This can cause permanent damage and loss of brain function. CAUSES A cerebral aneurysm is caused when a weakened part of the blood vessel expands. The blood vessel expands due to the constant pressure from the flow of blood through the weakened blood vessel. Usually the aneurysm expands slowly. As the weakened aneurysm expands, the walls of the aneurysm become weaker. Aneurysms may be associated with diseases that weaken and damage the walls of your blood vessels or blood vessels that develop abnormally. Some known causes for cerebral aneurysms are:  Head trauma.  Infection.  Use of "recreational  drugs" such as cocaine or amphetamines. RISK FACTORS People at risk for a cerebral aneurysm or hemorrhagic stroke usually have one or more risk factors, which include:  Having high blood pressure (hypertension).  Abusing alcohol.  Having abnormal blood vessels present since birth.  Having certain bleeding disorders, such as hemophilia, sickle cell disease, or liver disease.  Taking blood thinners (anticoagulants).  Smoking.  Having a family history of aneurysm. SIGNS AND SYMPTOMS  The signs and symptoms of an unruptured cerebral aneurysm will partly depend on its size and rate of growth. A small, unchanging aneurysm generally does not produce symptoms. A larger aneurysm that is steadily growing can increase pressure on the brain or nerves. That increased pressure from the unruptured cerebral aneurysm can cause:  A headache.  Problems with your vision.  Numbness or weakness in an arm or leg.  Problems with memory.  Problems speaking.  Seizures. If an aneurysm leaks or bursts, it can cause a stroke and be life-threatening. Symptoms may include:  A sudden, severe headache with no known cause. The headache is often described as the worst headache ever experienced.  Nausea or vomiting, especially  when combined with other symptoms such as a headache.  Sudden weakness or numbness of the face, arm, or leg, especially on one side of the body.  Sudden trouble walking or difficulty moving arms or legs.  Sudden confusion.  Sudden personality changes.  Trouble speaking (aphasia) or understanding.  Difficulty swallowing.  Sudden trouble seeing in one or both eyes.  Double vision.  Dizziness.  Loss of balance or coordination.  Intolerance to light.  Stiff neck. DIAGNOSIS  Your health care provider may use one of the following tests to diagnose your aneurysm:  Computed tomographic angiography (CTA). This test uses dye and a scanner to produce images of your blood  vessels.  Magnetic resonance angiography (MRA). This test uses an MRI machine to produce images of your blood vessels.  Digital subtraction angiography (DSA). This test uses dye and X-rays to take images of your blood vessels. Your health care provider may use this test to help determine the best course of treatment. TREATMENT  Unruptured Aneurysms Treatment is complex when an aneurysm is found and it is not causing problems. Treatment is very individualized, as each case is different. Many things must be considered, such as the size and exact location of your aneurysm, your age, your overall health, and your feelings and preferences. Small aneurysms in certain locations of the brain have a very low chance of bleeding or rupturing. These small aneurysms may not be treated. However, depending on the size and location of the aneurysm, treatments may be recommended and include:  Coiling. During this procedure, a catheter is inserted and advanced through a blood vessel. Once the catheter reaches the aneurysm, tiny coils are used to block blood flow into the aneurysm.  Surgical clipping. During surgery, a clip is placed at the base of the aneurysm. The clip prevents blood from continuing to enter the aneurysm.  Flow diversion. This procedure is used to divert blood flow around the aneurysm. Ruptured Aneurysms Immediate emergency surgery may be needed to help prevent damage to the brain and to reduce the risk of rebleeding. Timing of treatment is an important factor in the prevention of complications. Successful early treatment of a ruptured aneurysm (within the first 3 days of a bleed) helps to prevent rebleeding and blood vessel spasm. In some cases, there may be a reason to treat later (10-14 days after a rupture). Many things are considered when making this decision, and each case is handled individually. HOME CARE INSTRUCTIONS  Take medicines only as instructed by your health care provider.  Eat  healthy foods. It is recommended that you eat 5 or more servings of fruits and vegetables each day. Foods may need to be a special consistency (soft or pureed), or small bites may need to be taken if you have had a ruptured aneurysm or stroke. Certain dietary changes may be advised to address high blood pressure, high cholesterol, diabetes, or obesity.  Food choices that are low in salt (sodium), saturated fat, trans fat, and cholesterol are recommended to manage high blood pressure.  Food choices that are high in fiber and low in saturated fat, trans fat, and cholesterol are recommended to control cholesterol levels.  Controlling carbohydrate and sugar intake is recommended to manage diabetes.  Reducing calorie intake and making food choices that are low in sodium, saturated fat, trans fat, and cholesterol are recommended to manage obesity.  Maintain a healthy weight.  Stay physically active. It is recommended that you get at least 30 minutes of activity on  most or all days.  Do not smoke.  Limit alcohol use. Moderate alcohol use is considered to be:  No more than 2 drinks each day for men.  No more than 1 drink each day for nonpregnant women.  Stop drug abuse.  A safe home environment is important to reduce the risk of falls. Your health care provider may arrange for specialists to evaluate your home. Having grab bars in the bedroom and bathroom is often important. Your health care provider may arrange for special equipment to be used at home, such as raised toilets and a seat for the shower.  Physical, occupational, and speech therapy. Ongoing therapy may be needed to maximize your recovery after a ruptured aneurysm or stroke. If you have been advised to use a walker or a cane, use it at all times. Be sure to keep your therapy appointments.  Follow all instructions for follow-up with your health care provider. This is very important. This includes any referrals, physical therapy,  rehabilitation, and laboratory tests. Proper follow-up may prevent an aneurysm rupture or a stroke. SEEK IMMEDIATE MEDICAL CARE IF:  You have a sudden, severe headache with no known cause.  You have sudden nausea or vomiting with a severe headache.  You have sudden weakness or numbness of the face, arm, or leg, especially on one side of the body.  You have sudden trouble walking or difficulty moving arms or legs.  You have sudden confusion.  You have trouble speaking or understanding.  You have sudden trouble seeing in one or both eyes.  You have a sudden loss of balance or coordination.  You have a stiff neck.  You have difficulty breathing.  You have a partial or total loss of consciousness. Any of these symptoms may represent a serious problem that is an emergency. Do not wait to see if the symptoms will go away. Get medical help at once. Call your local emergency services (911 in [place] Do not drive yourself to the hospital.   This information is not intended to replace advice given to you by your health care provider. Make sure you discuss any questions you have with your health care provider.   Document Released: 05/02/2002 Document Revised: 08/31/2014 Document Reviewed: 01/26/2013 Elsevier Interactive Patient Education [employer]

## 2016-05-27 NOTE — Progress Notes (Signed)
[facility]    Provider:  Dr Lucia GaskinsAhern Referring Provider: Richmond CampbellKaplan, Kristen W., PA-C Primary Care Physician:  Lilia ArgueKAPLAN,KRISTEN, PA-C  CC: Orthostatic   HPI:  Barbara Hill is a 75 y.o. female here as a referral from Dr. Arlyce DiceKaplan for vertigo and aneurysm. Past medical history of migraine and atrial fibrillation. She has COPD and continues to smoke, she smells heavily of smoke today. She gets dizzy when she stands up. She has been diagnosed with orthostatic hypotension. She only drinks 2 glasses of water a day the rest is soda. She does not like water. She has been told to drink more water and she has not. She has been diagnosed with orthostatic hypotension as recently as August when she was inpatient(Orthostatic hypotension 123/65 supine to 77/52 standing in the ED). She gets dizzy when she stands up. She stood up quickly and she got dizzy and her head hurt and she fell in the past. Always on standing especially when too quick. Denies vertigo.  The dizziness has been ongoing for years at least several years.. She denies room spinning. Always when she stands up. No room spinning. Does not happen in bed when she rolls over. Not when sitting or standing. She is not wearing her compression stockings. Dizziness happens daily. Yesterday drank 3 glasses of water with improvement. She drinks pepsi during the day 2 and then 2 glasses of water at most. She is also here to discuss aneurysm seen on imaging recently.   Reviewed notes, labs and imaging from outside physicians, which showed:  Personally reviewed MRI brain and MRA head images and agree with the following:   TSH 1.07 nml,  : MRI brain: Mild to moderate atrophy and small vessel disease. No acute intracranial findings.  MRA NECK FINDINGS  Branching pattern of the brachiocephalic vessels from the arch is normal. No origin stenoses. Both common carotid arteries are widely patent to the bifurcation. No carotid bifurcation  atherosclerotic disease. No stenosis or irregularity. Both cervical internal carotid arteries are normal.  Both vertebral arteries are patent, but show some atherosclerotic disease. 30-50% stenosis of both vertebral artery origins and mild atherosclerotic irregularity of the proximal vertebral arteries. Both vertebral arteries are patent through the neck to the basilar.  MRA HEAD FINDINGS  Both internal carotid arteries are widely patent through the skullbase. The anterior and middle cerebral vessels are patent bilaterally without proximal stenosis. There is an aneurysm at the right MCA bifurcation measuring up to 8 mm in diameter. No other anterior circulation aneurysm.  Both vertebral arteries are patent to the basilar. No basilar stenosis. Posterior circulation branch vessels are patent. No posterior circulation aneurysm.  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 No other intracranial aneurysm.  No carotid bifurcation atherosclerotic disease.  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   BUN 7, creatinine 0.73, TSH 1.30 March 2016. Review of Systems: Patient complains of symptoms per HPI as well as the following symptoms: Headache, dizziness, depression, decreased energy, disinterest in activities. Pertinent negatives per HPI. All others negative.   Social History   Social History  . Marital status: Widowed    Spouse name: N/A  . Number of children: 2  . Years of education: 12   Occupational History  . Retired    Social History Main Topics  . Smoking status: Current Every Day Smoker    Packs/day: 0.50  . Smokeless tobacco: Current User  . Alcohol use  No  . Drug use: No  . Sexual activity: Not on file   Other Topics Concern  . Not on file   Social History Narrative   Lives with significant other, [name]    Caffeine use: none    Family History  Problem  Relation Age of Onset  . Heart attack Mother   . Transient ischemic attack Mother   . Heart attack Father 36  . Heart attack Other   . Asthma Other   . Heart failure Other   . Osteoporosis Other   . Heart Problems Brother   . CAD      Past Medical History:  Diagnosis Date  . Anxiety   . Arrhythmia    palps  . COPD (chronic obstructive pulmonary disease) (HCC)   . Dizziness   . Fatigue   . H/O: hysterectomy   . Headache(784.0)   . Hypertension   . Mitral valve prolapse   . Radiculopathy   . SVT (supraventricular tachycardia) (HCC)   . UTI (lower urinary tract infection)     Past Surgical History:  Procedure Laterality Date  . ABDOMINAL HYSTERECTOMY    . APPENDECTOMY    . CARDIAC CATHETERIZATION  12/95  . CHOLECYSTECTOMY    . ROTATOR CUFF REPAIR      Current Outpatient Prescriptions  Medication Sig Dispense Refill  . acetaminophen (TYLENOL) 325 MG tablet Take 650 mg by mouth every 6 (six) hours as needed for mild pain, moderate pain or headache.    Marland Kitchen acidophilus (RISAQUAD) CAPS capsule Take 1 capsule by mouth daily.    Marland Kitchen ALPRAZolam (XANAX) 0.5 MG tablet Take 0.5 mg by mouth 3 (three) times daily.     Marland Kitchen amLODipine (NORVASC) 10 MG tablet Take 1 tablet (10 mg total) by mouth daily. 30 tablet 6  . butalbital-acetaminophen-caffeine (FIORICET, ESGIC) 50-325-40 MG per tablet Take 1 tablet by mouth every 4 (four) hours as needed for headache or migraine.   3  . fexofenadine-pseudoephedrine (ALLEGRA-D) 60-120 MG 12 hr tablet Take 1 tablet by mouth daily.    . flecainide (TAMBOCOR) 100 MG tablet Take 1 tablet (100 mg total) by mouth 2 (two) times daily. 60 tablet 11  . FLUoxetine (PROZAC) 40 MG capsule Take 20 mg by mouth 2 (two) times daily.     . fluticasone (FLONASE) 50 MCG/ACT nasal spray Place 1 spray into both nostrils daily as needed for allergies or rhinitis.   11  . lisinopril (PRINIVIL,ZESTRIL) 10 MG tablet Take 1 tablet (10 mg total) by mouth daily. 30 tablet 11  .  meclizine (ANTIVERT) 25 MG tablet Take 12.5-25 mg by mouth 2 (two) times daily as needed for dizziness or nausea.     . metoprolol succinate (TOPROL-XL) 25 MG 24 hr tablet TAKE 1 TABLET BY MOUTH EVERY NIGHT AT BEDTIME 90 tablet 3  . Multiple Vitamins-Minerals (MULTIVITAMIN PO) Take 1 tablet by mouth daily.    Marland Kitchen omeprazole (PRILOSEC) 40 MG capsule Take 40 mg by mouth daily.      . Vitamin D, Ergocalciferol, (DRISDOL) 50000 UNITS CAPS capsule Take 1 capsule by mouth once a week. Take on Tuesdays  0   No current facility-administered medications for this visit.     Allergies as of 05/27/2016 - Review Complete 05/27/2016  Allergen Reaction Noted  . Dobutamine Other (See Comments) 05/13/2011  . Epinephrine Other (See Comments) 04/20/2011  . Darvocet [propoxyphene n-acetaminophen] Nausea And Vomiting 04/20/2011  . Excedrin extra strength [aspirin-acetaminophen-caffeine] Nausea And Vomiting 04/09/2014  . Propoxyphene  Nausea And Vomiting 04/20/2011  . Clarithromycin Other (See Comments) 04/20/2011  . Clarithromycin Other (See Comments) 04/20/2011  . Doxycycline Other (See Comments) 04/20/2011  . Penicillins Other (See Comments) 04/20/2011  . Prednisone Other (See Comments) 04/20/2011    Vitals: BP 127/67 (BP Location: Right Arm, Patient Position: Sitting, Cuff Size: Normal)   Pulse 61   Ht 5\' 3"  (1.6 m)   Wt 115 lb 6.4 oz (52.3 kg)   BMI 20.44 kg/m  Last Weight:  Wt Readings from Last 1 Encounters:  05/27/16 115 lb 6.4 oz (52.3 kg)   Last Height:   Ht Readings from Last 1 Encounters:  05/27/16 5\' 3"  (1.6 m)   Physical exam: Exam: Gen: NAD, conversant, thin , poorly groomed smells heavily of smoke                    CV: RRR, no MRG. No Carotid Bruits. No peripheral edema, warm, nontender Eyes: Conjunctivae clear without exudates or hemorrhage  Neuro: Detailed Neurologic Exam  Speech:    Speech is normal; fluent and spontaneous with normal comprehension.  Cognition:    The  patient is oriented to person, place, and time;     recent and remote memory intact;     language fluent;     normal attention, concentration,     fund of knowledge Cranial Nerves:    The pupils are equal, round, and reactive to light. Attempted fundoscopic exam coul dnot visualize due to small pupils. . Visual fields are full to finger confrontation. Extraocular movements are intact. Trigeminal sensation is intact and the muscles of mastication are normal. The face is symmetric. The palate elevates in the midline. Hearing intact. Voice is normal. Shoulder shrug is normal. The tongue has normal motion without fasciculations.   Coordination:    Normal finger to nose and heel to shin.   Gait:    Heel-toe and tandem gait are normal.   Motor Observation:    no involuntary movements noted. Tone:    Normal muscle tone.    Posture:    Posture is normal. normal erect    Strength:    Strength is V/V in the upper and lower limbs.      Sensation: intact to LT     Reflex Exam:  DTR's:    Deep tendon reflexes in the upper and lower extremities are symmetrical bilaterally.   Toes:    The toes are downgoing bilaterally.   Clonus:    Clonus is absent.       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 Completed vestibular rehabilitation.   Aneurysm: 8mm. Needs to evaluated by an interventional radiologist. Will refer to Dr. Titus Dubin Dizziness: Orthostatic and vestibular. She was recently discharged with orthostatic hypotension in August from hospital and follows with pcp and has a cardiologist  (Orthostatic hypotension 123/65 supine to 77/52 standing). Dizziness only when standing. Not when sitting or laying. She felt better yesterday after drinking 3 glasses of water but she continues not hydrate enough, she is not wearing her compression stockings. Reinforced hydration, compression  stockings, and rising slowly from a seated position. (there is not much more we can do for her dizziness in neurology, mri of the brain was unremarkable and she has completed vestibular therapy. If she continues to have dizziness and vestibular causes thought to be contributing she needs repeat vestibular therapy or a specialty vestibular clinic)  Sarina Ill, MD  [facility] [address] [place], [place] [address]  Phone [phone] Fax [phone]

## 2016-05-28 ENCOUNTER — Other Ambulatory Visit (HOSPITAL_COMMUNITY): Payer: Self-pay | Admitting: Interventional Radiology

## 2016-05-28 DIAGNOSIS — I671 Cerebral aneurysm, nonruptured: Secondary | ICD-10-CM

## 2016-06-02 ENCOUNTER — Ambulatory Visit (HOSPITAL_COMMUNITY)
Admission: RE | Admit: 2016-06-02 | Discharge: 2016-06-02 | Disposition: A | Discharge status: Home / Self Care | Payer: Medicare HMO | Source: Ambulatory Visit | Attending: Interventional Radiology | Admitting: Interventional Radiology

## 2016-06-02 DIAGNOSIS — I671 Cerebral aneurysm, nonruptured: Secondary | ICD-10-CM

## 2016-06-02 HISTORY — PX: IR GENERIC HISTORICAL: IMG1180011

## 2016-06-04 ENCOUNTER — Encounter (HOSPITAL_COMMUNITY): Payer: Self-pay | Admitting: Interventional Radiology

## 2016-06-05 ENCOUNTER — Other Ambulatory Visit (HOSPITAL_COMMUNITY): Payer: Self-pay | Admitting: Interventional Radiology

## 2016-06-05 DIAGNOSIS — I671 Cerebral aneurysm, nonruptured: Secondary | ICD-10-CM

## 2016-06-19 NOTE — Pre-Procedure Instructions (Signed)
    Barbara Hill  06/19/2016      [id number] - [place], [place] - [address] [place] [address] Phone: [phone] Fax: [phone]  [facility] - [place], [place] - [address] [facility] [address] [place] [address] Phone: [phone] Fax: [phone]    Your procedure is scheduled on 06/29/16.  [facility] 630 [facility].M.  Call this number if you have problems the morning of surgery:  [phone]   Remember:  Do not eat food or drink liquids after midnight.  Take these medicines the morning of surgery with A SIP OF WATER --tylenol,xanax,amlodipine,prozac,metoprolol,prilosec   Do not wear jewelry, make-up or nail polish.  Do not wear lotions, powders, or perfumes, or deoderant.  Do not shave 48 hours prior to surgery.  Men may shave face and neck.  Do not bring valuables to the hospital.  [facility] is not responsible for any belongings or valuables.  Contacts, dentures or bridgework may not be worn into surgery.  Leave your suitcase in the car.  After surgery it may be brought to your room.  For patients admitted to the hospital, discharge time will be determined by your treatment team.  Patients discharged the day of surgery will not be allowed to drive home.   Name and phone number of your driver:    Special instructions:    Please read over the following fact sheets that you were given.

## 2016-06-22 ENCOUNTER — Encounter (HOSPITAL_COMMUNITY): Payer: Self-pay

## 2016-06-22 ENCOUNTER — Other Ambulatory Visit: Payer: Self-pay | Admitting: Radiology

## 2016-06-22 ENCOUNTER — Encounter (HOSPITAL_COMMUNITY)
Admission: RE | Admit: 2016-06-22 | Discharge: 2016-06-22 | Disposition: A | Discharge status: Home / Self Care | Payer: Medicare HMO | Source: Ambulatory Visit | Attending: Interventional Radiology | Admitting: Interventional Radiology

## 2016-06-22 DIAGNOSIS — Z01812 Encounter for preprocedural laboratory examination: Secondary | ICD-10-CM | POA: Diagnosis not present

## 2016-06-22 DIAGNOSIS — Z01818 Encounter for other preprocedural examination: Secondary | ICD-10-CM | POA: Diagnosis not present

## 2016-06-22 DIAGNOSIS — I671 Cerebral aneurysm, nonruptured: Secondary | ICD-10-CM | POA: Diagnosis not present

## 2016-06-22 LAB — COMPREHENSIVE METABOLIC PANEL
ALBUMIN: 3.9 g/dL (ref 3.5–5.0)
ALK PHOS: 67 U/L (ref 38–126)
ALT: 12 U/L — ABNORMAL LOW (ref 14–54)
AST: 21 U/L (ref 15–41)
Anion gap: 9 (ref 5–15)
BILIRUBIN TOTAL: 0.5 mg/dL (ref 0.3–1.2)
BUN: 9 mg/dL (ref 6–20)
CALCIUM: 9.8 mg/dL (ref 8.9–10.3)
CO2: 26 mmol/L (ref 22–32)
Chloride: 102 mmol/L (ref 101–111)
Creatinine, Ser: 0.93 mg/dL (ref 0.44–1.00)
GFR calc Af Amer: >60 mL/min (ref >60)
GFR calc non Af Amer: 59 mL/min — ABNORMAL LOW (ref >60)
GLUCOSE: 85 mg/dL (ref 65–99)
Potassium: 3.5 mmol/L (ref 3.5–5.1)
SODIUM: 137 mmol/L (ref 135–145)
TOTAL PROTEIN: 7.3 g/dL (ref 6.5–8.1)

## 2016-06-22 LAB — CBC WITH DIFFERENTIAL/PLATELET
BASOS ABS: 0 10*3/uL (ref 0.0–0.1)
BASOS PCT: 0 %
Eosinophils Absolute: 0.4 10*3/uL (ref 0.0–0.7)
Eosinophils Relative: 5 %
HEMATOCRIT: 39 % (ref 36.0–46.0)
HEMOGLOBIN: 12.7 g/dL (ref 12.0–15.0)
Lymphocytes Relative: 36 %
Lymphs Abs: 2.9 10*3/uL (ref 0.7–4.0)
MCH: 30.1 pg (ref 26.0–34.0)
MCHC: 32.6 g/dL (ref 30.0–36.0)
MCV: 92.4 fL (ref 78.0–100.0)
Monocytes Absolute: 0.7 10*3/uL (ref 0.1–1.0)
Monocytes Relative: 9 %
NEUTROS ABS: 4 10*3/uL (ref 1.7–7.7)
NEUTROS PCT: 50 %
Platelets: 332 10*3/uL (ref 150–400)
RBC: 4.22 MIL/uL (ref 3.87–5.11)
RDW: 13 % (ref 11.5–15.5)
WBC: 8 10*3/uL (ref 4.0–10.5)

## 2016-06-22 LAB — APTT: APTT: 31 s (ref 24–36)

## 2016-06-22 LAB — PROTIME-INR
INR: 1.1
Prothrombin Time: 14.2 seconds (ref 11.4–15.2)

## 2016-06-23 ENCOUNTER — Other Ambulatory Visit: Payer: Self-pay | Admitting: Radiology

## 2016-06-26 ENCOUNTER — Telehealth (HOSPITAL_COMMUNITY): Payer: Self-pay

## 2016-06-26 NOTE — Telephone Encounter (Signed)
Called to remind pt of procedure on 06/29/16. Left message. AW

## 2016-06-29 ENCOUNTER — Ambulatory Visit (HOSPITAL_COMMUNITY): Payer: Medicare HMO | Admitting: Certified Registered"

## 2016-06-29 ENCOUNTER — Inpatient Hospital Stay (HOSPITAL_COMMUNITY)
Admission: AD | Admit: 2016-06-29 | Discharge: 2016-07-01 | DRG: 024 | Disposition: A | Discharge status: Home / Self Care | Payer: Medicare HMO | Source: Ambulatory Visit | Attending: Interventional Radiology | Admitting: Interventional Radiology

## 2016-06-29 ENCOUNTER — Ambulatory Visit (HOSPITAL_COMMUNITY)
Admission: RE | Admit: 2016-06-29 | Discharge: 2016-06-29 | Disposition: A | Discharge status: Home / Self Care | Payer: Medicare HMO | Source: Ambulatory Visit | Attending: Interventional Radiology | Admitting: Interventional Radiology

## 2016-06-29 ENCOUNTER — Encounter (HOSPITAL_COMMUNITY): Payer: Self-pay | Admitting: *Deleted

## 2016-06-29 ENCOUNTER — Encounter (HOSPITAL_COMMUNITY)
Admission: AD | Disposition: A | Discharge status: Home / Self Care | Payer: Self-pay | Source: Ambulatory Visit | Attending: Interventional Radiology

## 2016-06-29 DIAGNOSIS — F419 Anxiety disorder, unspecified: Secondary | ICD-10-CM | POA: Diagnosis present

## 2016-06-29 DIAGNOSIS — Z9181 History of falling: Secondary | ICD-10-CM

## 2016-06-29 DIAGNOSIS — D62 Acute posthemorrhagic anemia: Secondary | ICD-10-CM | POA: Diagnosis not present

## 2016-06-29 DIAGNOSIS — I10 Essential (primary) hypertension: Secondary | ICD-10-CM | POA: Diagnosis present

## 2016-06-29 DIAGNOSIS — F17200 Nicotine dependence, unspecified, uncomplicated: Secondary | ICD-10-CM | POA: Diagnosis present

## 2016-06-29 DIAGNOSIS — I671 Cerebral aneurysm, nonruptured: Secondary | ICD-10-CM | POA: Diagnosis present

## 2016-06-29 DIAGNOSIS — R29898 Other symptoms and signs involving the musculoskeletal system: Secondary | ICD-10-CM

## 2016-06-29 DIAGNOSIS — I639 Cerebral infarction, unspecified: Secondary | ICD-10-CM

## 2016-06-29 DIAGNOSIS — Z79899 Other long term (current) drug therapy: Secondary | ICD-10-CM

## 2016-06-29 DIAGNOSIS — I6340 Cerebral infarction due to embolism of unspecified cerebral artery: Secondary | ICD-10-CM | POA: Insufficient documentation

## 2016-06-29 DIAGNOSIS — I63411 Cerebral infarction due to embolism of right middle cerebral artery: Secondary | ICD-10-CM

## 2016-06-29 DIAGNOSIS — I341 Nonrheumatic mitral (valve) prolapse: Secondary | ICD-10-CM | POA: Diagnosis present

## 2016-06-29 DIAGNOSIS — J449 Chronic obstructive pulmonary disease, unspecified: Secondary | ICD-10-CM | POA: Diagnosis present

## 2016-06-29 DIAGNOSIS — R42 Dizziness and giddiness: Secondary | ICD-10-CM | POA: Diagnosis present

## 2016-06-29 DIAGNOSIS — I959 Hypotension, unspecified: Secondary | ICD-10-CM | POA: Diagnosis present

## 2016-06-29 DIAGNOSIS — M79609 Pain in unspecified limb: Secondary | ICD-10-CM | POA: Diagnosis not present

## 2016-06-29 DIAGNOSIS — I951 Orthostatic hypotension: Secondary | ICD-10-CM | POA: Diagnosis not present

## 2016-06-29 HISTORY — PX: IR GENERIC HISTORICAL: IMG1180011

## 2016-06-29 HISTORY — PX: RADIOLOGY WITH ANESTHESIA: SHX6223

## 2016-06-29 LAB — POCT ACTIVATED CLOTTING TIME
ACTIVATED CLOTTING TIME: 219 s
ACTIVATED CLOTTING TIME: 252 s
Activated Clotting Time: 208 seconds

## 2016-06-29 LAB — PLATELET INHIBITION P2Y12: Platelet Function  P2Y12: 127 [PRU] — ABNORMAL LOW (ref 194–418)

## 2016-06-29 LAB — MRSA PCR SCREENING: MRSA BY PCR: NEGATIVE

## 2016-06-29 SURGERY — RADIOLOGY WITH ANESTHESIA
Anesthesia: General

## 2016-06-29 MED ORDER — VANCOMYCIN HCL IN DEXTROSE 1-5 GM/200ML-% IV SOLN
INTRAVENOUS | Status: AC
  Filled 2016-06-29: qty 200

## 2016-06-29 MED ORDER — EPTIFIBATIDE 20 MG/10ML IV SOLN
INTRAVENOUS | Status: AC
  Filled 2016-06-29: qty 10

## 2016-06-29 MED ORDER — CLOPIDOGREL BISULFATE 75 MG PO TABS
75.0000 mg | ORAL_TABLET | Freq: Every day | ORAL | Status: DC
  Administered 2016-06-30 – 2016-07-01 (×2): 75 mg via ORAL
  Filled 2016-06-29 (×2): qty 1

## 2016-06-29 MED ORDER — LACTATED RINGERS IV SOLN: INTRAVENOUS | Status: DC

## 2016-06-29 MED ORDER — SODIUM CHLORIDE 0.9 % IV SOLN
INTRAVENOUS | Status: DC
  Administered 2016-06-29 (×2): via INTRAVENOUS

## 2016-06-29 MED ORDER — RISAQUAD PO CAPS
1.0000 | ORAL_CAPSULE | Freq: Every day | ORAL | Status: DC
  Administered 2016-06-30 – 2016-07-01 (×2): 1 via ORAL
  Filled 2016-06-29 (×3): qty 1

## 2016-06-29 MED ORDER — CLOPIDOGREL BISULFATE 75 MG PO TABS
ORAL_TABLET | ORAL | Status: AC
  Filled 2016-06-29: qty 1

## 2016-06-29 MED ORDER — NITROGLYCERIN 1 MG/10 ML FOR IR/CATH LAB
INTRA_ARTERIAL | Status: AC
  Filled 2016-06-29: qty 10

## 2016-06-29 MED ORDER — IOPAMIDOL (ISOVUE-300) INJECTION 61%
INTRAVENOUS | Status: AC
  Administered 2016-06-29: 20 mL
  Filled 2016-06-29: qty 100

## 2016-06-29 MED ORDER — ABCIXIMAB 2 MG/ML IV SOLN
4.0000 mg | INTRAVENOUS | Status: AC
  Administered 2016-06-29: 4 mg via INTRAVENOUS
  Filled 2016-06-29: qty 5

## 2016-06-29 MED ORDER — EPHEDRINE SULFATE 50 MG/ML IJ SOLN
INTRAMUSCULAR | Status: DC | PRN
  Administered 2016-06-29 (×2): 5 mg via INTRAVENOUS

## 2016-06-29 MED ORDER — FLECAINIDE ACETATE 100 MG PO TABS
100.0000 mg | ORAL_TABLET | Freq: Two times a day (BID) | ORAL | Status: DC
  Administered 2016-06-29 – 2016-07-01 (×4): 100 mg via ORAL
  Filled 2016-06-29 (×5): qty 1

## 2016-06-29 MED ORDER — ROCURONIUM BROMIDE 100 MG/10ML IV SOLN
INTRAVENOUS | Status: DC | PRN
  Administered 2016-06-29: 50 mg via INTRAVENOUS
  Administered 2016-06-29: 10 mg via INTRAVENOUS

## 2016-06-29 MED ORDER — FLUOXETINE HCL 20 MG PO CAPS
20.0000 mg | ORAL_CAPSULE | Freq: Two times a day (BID) | ORAL | Status: DC
  Administered 2016-06-29 – 2016-07-01 (×4): 20 mg via ORAL
  Filled 2016-06-29 (×4): qty 1

## 2016-06-29 MED ORDER — ALPRAZOLAM 0.25 MG PO TABS
0.2500 mg | ORAL_TABLET | Freq: Three times a day (TID) | ORAL | Status: DC
  Administered 2016-06-29 – 2016-07-01 (×5): 0.5 mg via ORAL
  Filled 2016-06-29: qty 1
  Filled 2016-06-29: qty 2
  Filled 2016-06-29: qty 1
  Filled 2016-06-29 (×3): qty 2

## 2016-06-29 MED ORDER — PANTOPRAZOLE SODIUM 40 MG PO TBEC
40.0000 mg | DELAYED_RELEASE_TABLET | Freq: Every day | ORAL | Status: DC
  Administered 2016-06-30 – 2016-07-01 (×2): 40 mg via ORAL
  Filled 2016-06-29 (×2): qty 1

## 2016-06-29 MED ORDER — LISINOPRIL 10 MG PO TABS
10.0000 mg | ORAL_TABLET | Freq: Every day | ORAL | Status: DC
  Administered 2016-06-30 – 2016-07-01 (×2): 10 mg via ORAL
  Filled 2016-06-29 (×2): qty 1

## 2016-06-29 MED ORDER — AMLODIPINE BESYLATE 10 MG PO TABS
10.0000 mg | ORAL_TABLET | Freq: Every day | ORAL | Status: DC
  Administered 2016-06-30 – 2016-07-01 (×2): 10 mg via ORAL
  Filled 2016-06-29 (×2): qty 1

## 2016-06-29 MED ORDER — SODIUM CHLORIDE 0.9 % IV BOLUS (SEPSIS)
250.0000 mL | Freq: Once | INTRAVENOUS | Status: AC
  Administered 2016-06-29: 250 mL via INTRAVENOUS

## 2016-06-29 MED ORDER — SODIUM CHLORIDE 0.9 % IV SOLN
INTRAVENOUS | Status: DC
  Administered 2016-06-29 – 2016-07-01 (×4): via INTRAVENOUS

## 2016-06-29 MED ORDER — MIDAZOLAM HCL 5 MG/5ML IJ SOLN
INTRAMUSCULAR | Status: DC | PRN
  Administered 2016-06-29: 0.5 mg via INTRAVENOUS

## 2016-06-29 MED ORDER — VANCOMYCIN HCL 1000 MG IV SOLR
1000.0000 mg | INTRAVENOUS | Status: AC
  Administered 2016-06-29: 1000 mg via INTRAVENOUS
  Filled 2016-06-29: qty 1000

## 2016-06-29 MED ORDER — METOPROLOL SUCCINATE ER 25 MG PO TB24: 25.0000 mg | ORAL_TABLET | Freq: Every day | ORAL | Status: DC

## 2016-06-29 MED ORDER — IOPAMIDOL (ISOVUE-300) INJECTION 61%
INTRAVENOUS | Status: AC
  Administered 2016-06-29: 100 mL
  Filled 2016-06-29: qty 150

## 2016-06-29 MED ORDER — LIDOCAINE HCL 1 % IJ SOLN
INTRAMUSCULAR | Status: DC | PRN
Start: 2016-06-29 — End: 2016-06-30
  Administered 2016-06-29: 8 mL

## 2016-06-29 MED ORDER — ARTIFICIAL TEARS OP OINT
TOPICAL_OINTMENT | OPHTHALMIC | Status: DC | PRN
  Administered 2016-06-29: 1 via OPHTHALMIC

## 2016-06-29 MED ORDER — HEPARIN (PORCINE) IN NACL 100-0.45 UNIT/ML-% IJ SOLN
INTRAMUSCULAR | Status: AC
  Filled 2016-06-29: qty 250

## 2016-06-29 MED ORDER — EPTIFIBATIDE 20 MG/10ML IV SOLN
INTRAVENOUS | Status: DC | PRN
  Administered 2016-06-29 (×7): 1.8 mg via INTRAVENOUS

## 2016-06-29 MED ORDER — SUGAMMADEX SODIUM 200 MG/2ML IV SOLN
INTRAVENOUS | Status: DC | PRN
  Administered 2016-06-29: 150 mg via INTRAVENOUS

## 2016-06-29 MED ORDER — ONDANSETRON HCL 4 MG/2ML IJ SOLN
INTRAMUSCULAR | Status: DC | PRN
  Administered 2016-06-29: 4 mg via INTRAVENOUS

## 2016-06-29 MED ORDER — PHENYLEPHRINE HCL 10 MG/ML IJ SOLN
INTRAVENOUS | Status: DC | PRN
  Administered 2016-06-29: 40 ug/min via INTRAVENOUS

## 2016-06-29 MED ORDER — BOOST PO LIQD
237.0000 mL | Freq: Every day | ORAL | Status: DC
  Administered 2016-06-29 – 2016-07-01 (×2): 237 mL via ORAL
  Filled 2016-06-29 (×4): qty 237

## 2016-06-29 MED ORDER — LORATADINE 10 MG PO TABS
10.0000 mg | ORAL_TABLET | Freq: Every day | ORAL | Status: DC
  Administered 2016-06-29 – 2016-07-01 (×3): 10 mg via ORAL
  Filled 2016-06-29 (×3): qty 1

## 2016-06-29 MED ORDER — VITAMIN D 1000 UNITS PO TABS
1000.0000 [IU] | ORAL_TABLET | Freq: Every day | ORAL | Status: DC
  Administered 2016-06-30 – 2016-07-01 (×2): 1000 [IU] via ORAL
  Filled 2016-06-29 (×3): qty 1

## 2016-06-29 MED ORDER — PROTAMINE SULFATE 10 MG/ML IV SOLN
INTRAVENOUS | Status: DC | PRN
  Administered 2016-06-29: 5 mg via INTRAVENOUS

## 2016-06-29 MED ORDER — LIDOCAINE HCL (CARDIAC) 20 MG/ML IV SOLN
INTRAVENOUS | Status: DC | PRN
  Administered 2016-06-29: 100 mg via INTRAVENOUS

## 2016-06-29 MED ORDER — NICARDIPINE HCL IN NACL 20-0.86 MG/200ML-% IV SOLN: 5.0000 mg/h | INTRAVENOUS | Status: DC

## 2016-06-29 MED ORDER — BUTALBITAL-APAP-CAFFEINE 50-325-40 MG PO TABS
1.0000 | ORAL_TABLET | ORAL | Status: DC | PRN
  Administered 2016-06-29 – 2016-06-30 (×2): 2 via ORAL
  Administered 2016-06-30 – 2016-07-01 (×2): 1 via ORAL
  Administered 2016-07-01: 2 via ORAL
  Filled 2016-06-29: qty 2
  Filled 2016-06-29 (×2): qty 1
  Filled 2016-06-29 (×2): qty 2

## 2016-06-29 MED ORDER — HEPARIN (PORCINE) IN NACL 100-0.45 UNIT/ML-% IJ SOLN
500.0000 [IU]/h | INTRAMUSCULAR | Status: DC
  Filled 2016-06-29: qty 250

## 2016-06-29 MED ORDER — CLOPIDOGREL BISULFATE 75 MG PO TABS
75.0000 mg | ORAL_TABLET | ORAL | Status: AC
  Administered 2016-06-29: 75 mg via ORAL

## 2016-06-29 MED ORDER — LIDOCAINE HCL 1 % IJ SOLN
INTRAMUSCULAR | Status: AC
  Filled 2016-06-29: qty 20

## 2016-06-29 MED ORDER — ONDANSETRON HCL 4 MG/2ML IJ SOLN: 4.0000 mg | Freq: Once | INTRAMUSCULAR | Status: DC | PRN

## 2016-06-29 MED ORDER — HYDROMORPHONE HCL 1 MG/ML IJ SOLN
0.2500 mg | INTRAMUSCULAR | Status: DC | PRN
  Administered 2016-06-29: 0.5 mg via INTRAVENOUS

## 2016-06-29 MED ORDER — MECLIZINE HCL 12.5 MG PO TABS
12.5000 mg | ORAL_TABLET | Freq: Two times a day (BID) | ORAL | Status: DC | PRN
  Filled 2016-06-29: qty 2

## 2016-06-29 MED ORDER — PHENYLEPHRINE HCL 10 MG/ML IJ SOLN
INTRAMUSCULAR | Status: DC | PRN
  Administered 2016-06-29 (×3): 40 ug via INTRAVENOUS

## 2016-06-29 MED ORDER — OXYCODONE HCL 5 MG PO TABS: 5.0000 mg | ORAL_TABLET | Freq: Once | ORAL | Status: DC | PRN

## 2016-06-29 MED ORDER — FENTANYL CITRATE (PF) 100 MCG/2ML IJ SOLN
INTRAMUSCULAR | Status: DC | PRN
  Administered 2016-06-29: 25 ug via INTRAVENOUS
  Administered 2016-06-29: 175 ug via INTRAVENOUS

## 2016-06-29 MED ORDER — ONDANSETRON HCL 4 MG/2ML IJ SOLN: 4.0000 mg | Freq: Four times a day (QID) | INTRAMUSCULAR | Status: DC | PRN

## 2016-06-29 MED ORDER — HEPARIN (PORCINE) IN NACL 100-0.45 UNIT/ML-% IJ SOLN
500.0000 [IU]/h | INTRAMUSCULAR | Status: DC
  Administered 2016-06-29: 500 [IU]/h via INTRAVENOUS
  Filled 2016-06-29: qty 250

## 2016-06-29 MED ORDER — HEPARIN SODIUM (PORCINE) 1000 UNIT/ML IJ SOLN
INTRAMUSCULAR | Status: DC | PRN
  Administered 2016-06-29: 3000 [IU] via INTRAVENOUS
  Administered 2016-06-29: 1000 [IU] via INTRAVENOUS

## 2016-06-29 MED ORDER — HYDROMORPHONE HCL 2 MG/ML IJ SOLN
INTRAMUSCULAR | Status: AC
  Filled 2016-06-29: qty 1

## 2016-06-29 MED ORDER — OXYCODONE HCL 5 MG/5ML PO SOLN: 5.0000 mg | Freq: Once | ORAL | Status: DC | PRN

## 2016-06-29 MED ORDER — CEFAZOLIN SODIUM-DEXTROSE 2-4 GM/100ML-% IV SOLN
INTRAVENOUS | Status: AC
  Filled 2016-06-29: qty 100

## 2016-06-29 MED ORDER — NIMODIPINE 30 MG PO CAPS: 0.0000 mg | ORAL_CAPSULE | ORAL | Status: DC

## 2016-06-29 MED ORDER — SODIUM CHLORIDE 0.9 % IJ SOLN
INTRAVENOUS | Status: DC | PRN
  Administered 2016-06-29 (×2): 25 ug via INTRA_ARTERIAL

## 2016-06-29 MED ORDER — FLUTICASONE PROPIONATE 50 MCG/ACT NA SUSP: 1.0000 | Freq: Every day | NASAL | Status: DC | PRN

## 2016-06-29 MED ORDER — ASPIRIN 325 MG PO TABS
325.0000 mg | ORAL_TABLET | Freq: Every day | ORAL | Status: DC
  Administered 2016-06-30 – 2016-07-01 (×2): 325 mg via ORAL
  Filled 2016-06-29 (×2): qty 1

## 2016-06-29 MED ORDER — PROPOFOL 10 MG/ML IV BOLUS
INTRAVENOUS | Status: DC | PRN
Start: 2016-06-29 — End: 2016-06-29
  Administered 2016-06-29: 120 mg via INTRAVENOUS

## 2016-06-29 NOTE — Progress Notes (Signed)
ANTICOAGULATION CONSULT NOTE - Initial Consult  Pharmacy Consult for Heparin Indication: VTE prophylaxis  Allergies  Allergen Reactions  . Dobutamine Other (See Comments)    Heart beating hard with CP, neck pain, and weakness  . Darvocet [Propoxyphene N-Acetaminophen] Nausea And Vomiting  . Excedrin Extra Strength [Aspirin-Acetaminophen-Caffeine] Nausea And Vomiting  . Clarithromycin Other (See Comments)    Yeast infection  . Doxycycline Other (See Comments)    Makes stomach hurt  . Penicillins Other (See Comments)    Yeast infection  Has patient had a PCN reaction causing immediate rash, facial/tongue/throat swelling, SOB or lightheadedness with hypotension: No Has patient had a PCN reaction causing severe rash involving mucus membranes or skin necrosis: No Has patient had a PCN reaction that required hospitalization No Has patient had a PCN reaction occurring within the last 10 years: No If all of the above answers are "NO", then may proceed with Cephalosporin use.   . Prednisone Other (See Comments)    Shakes   . Propoxyphene Nausea And Vomiting   Patient Measurements: Weight: 115 lb (52.2 kg) Heparin Dosing Weight: 52.2 kg  Vital Signs: Temp: 98 F (36.7 C) (11/06 1426) Temp Source: Oral (11/06 0707) BP: 149/45 (11/06 0707) Pulse Rate: 61 (11/06 1545)  Labs: No results for input(s): HGB, HCT, PLT, APTT, LABPROT, INR, HEPARINUNFRC, HEPRLOWMOCWT, CREATININE, CKTOTAL, CKMB, TROPONINI in the last 72 hours.  Estimated Creatinine Clearance: 43.7 mL/min (by C-G formula based on SCr of 0.93 mg/dL).  Medical History: Past Medical History:  Diagnosis Date  . Anxiety   . Arrhythmia    palps  . COPD (chronic obstructive pulmonary disease) (HCC)   . Dizziness   . Fatigue   . H/O: hysterectomy   . Headache(784.0)   . Hypertension   . Mitral valve prolapse   . Radiculopathy   . SVT (supraventricular tachycardia) (HCC)   . UTI (lower urinary tract infection)     Medications:  Prescriptions Prior to Admission  Medication Sig Dispense Refill Last Dose  . acetaminophen (TYLENOL) 650 MG CR tablet Take 1,300 mg by mouth every 8 (eight) hours as needed for pain.     Marland Kitchen. acidophilus (RISAQUAD) CAPS capsule Take 1 capsule by mouth daily.   Taking  . ALPRAZolam (XANAX) 0.5 MG tablet Take 0.25-0.5 mg by mouth 3 (three) times daily.    Taking  . amLODipine (NORVASC) 10 MG tablet Take 1 tablet (10 mg total) by mouth daily. 30 tablet 6 Taking  . butalbital-acetaminophen-caffeine (FIORICET, ESGIC) 50-325-40 MG per tablet Take 1-2 tablets by mouth every 4 (four) hours as needed for headache or migraine.   3 Taking  . cholecalciferol (VITAMIN D) 1000 units tablet Take 1,000 Units by mouth daily.     . fexofenadine-pseudoephedrine (ALLEGRA-D) 60-120 MG 12 hr tablet Take 1 tablet by mouth daily as needed (allergies).    Taking  . flecainide (TAMBOCOR) 100 MG tablet Take 1 tablet (100 mg total) by mouth 2 (two) times daily. 60 tablet 11 Taking  . FLUoxetine (PROZAC) 20 MG capsule Take 20 mg by mouth 2 (two) times daily.  11   . fluticasone (FLONASE) 50 MCG/ACT nasal spray Place 1 spray into both nostrils daily as needed for allergies or rhinitis.   11 Taking  . lactose free nutrition (BOOST) LIQD Take 237 mLs by mouth daily.     Marland Kitchen. lisinopril (PRINIVIL,ZESTRIL) 10 MG tablet Take 1 tablet (10 mg total) by mouth daily. 30 tablet 11 Taking  . meclizine (ANTIVERT) 25 MG tablet  Take 12.5-25 mg by mouth 2 (two) times daily as needed for dizziness.    Taking  . metoprolol succinate (TOPROL-XL) 25 MG 24 hr tablet TAKE 1 TABLET BY MOUTH EVERY NIGHT AT BEDTIME 90 tablet 3 Taking  . omeprazole (PRILOSEC) 40 MG capsule Take 40 mg by mouth daily.     Taking   Assessment: Barbara Hill is a 75 y.o. female who underwent cerebral arteriogram with embolization of right MCA aneurysm by Dr. Corliss Skainseveshwar. Pharmacy has been consulted to [facility] procedure. No  anticoagulants prior to admission. HgB 12.7 and Plt 332.  Goal of Therapy:  Heparin level 0.1-0.25 units/ml Monitor platelets by anticoagulation protocol: Yes   Plan:  Start heparin infusion at 500 units/hr Check anti-Xa level in 8 hours and daily while on heparin Continue to monitor H&H and platelets  Norberta Stobaugh L Leodan Bolyard 06/29/2016,3:56 PM

## 2016-06-29 NOTE — Progress Notes (Signed)
Referring Physician(s): Dr Azell DerA Ahern  Supervising Physician: Julieanne Cottoneveshwar, Sanjeev  Patient Status:  [facility]  Chief Complaint:  R MCA aneurysm Pipeline stent placement  Subjective:  CEREBRAL ANGIOGRAM [[id number] (Custom)]       S/p RT Vert artery angiogram and RT Common carotid arteriogram,follwed by  endovascular treateatment of wide neck RT MCA aneurysm with pipeline flex flow diverter      Doing well post op Moving all 4s No complaints Speech and vision without issue  Allergies: Dobutamine; Darvocet [propoxyphene n-acetaminophen]; Excedrin extra strength [aspirin-acetaminophen-caffeine]; Clarithromycin; Doxycycline; Penicillins; Prednisone; and Propoxyphene  Medications: Prior to Admission medications   Medication Sig Start Date End Date Taking? Authorizing Provider  acetaminophen (TYLENOL) 650 MG CR tablet Take 1,300 mg by mouth every 8 (eight) hours as needed for pain.   Yes Historical Provider, MD  acidophilus (RISAQUAD) CAPS capsule Take 1 capsule by mouth daily.   Yes Historical Provider, MD  ALPRAZolam [name](XANAX) 0.5 MG tablet Take 0.25-0.5 mg by mouth 3 (three) times daily.    Yes Historical Provider, MD  amLODipine (NORVASC) 10 MG tablet Take 1 tablet (10 mg total) by mouth daily. 05/01/16 07/30/16 Yes Pricilla RifflePaula V Ross, MD  butalbital-acetaminophen-caffeine (FIORICET, ESGIC) [phone] MG per tablet Take 1-2 tablets by mouth every 4 (four) hours as needed for headache or migraine.  07/06/14  Yes Historical Provider, MD  cholecalciferol (VITAMIN D) 1000 units tablet Take 1,000 Units by mouth daily.   Yes Historical Provider, MD  fexofenadine-pseudoephedrine (ALLEGRA-D) 60-120 MG 12 hr tablet Take 1 tablet by mouth daily as needed (allergies).    Yes Historical Provider, MD  flecainide (TAMBOCOR) 100 MG tablet Take 1 tablet (100 mg total) by mouth 2 (two) times daily. 03/31/16  Yes Scott T Alben SpittleWeaver, PA-C  FLUoxetine (PROZAC) 20 MG capsule Take 20 mg by mouth 2 (two) times daily.  06/07/16  Yes Historical Provider, MD  fluticasone (FLONASE) 50 MCG/ACT nasal spray Place 1 spray into both nostrils daily as needed for allergies or rhinitis.  10/11/14  Yes Historical Provider, MD  lactose free nutrition (BOOST) LIQD Take 237 mLs by mouth daily.   Yes Historical Provider, MD  lisinopril (PRINIVIL,ZESTRIL) 10 MG tablet Take 1 tablet (10 mg total) by mouth daily. 03/31/16  Yes Scott Moishe Spice Weaver, PA-C  meclizine (ANTIVERT) 25 MG tablet Take 12.5-25 mg by mouth 2 (two) times daily as needed for dizziness.  04/04/14  Yes Historical Provider, MD  metoprolol succinate (TOPROL-XL) 25 MG 24 hr tablet TAKE 1 TABLET BY MOUTH EVERY NIGHT AT BEDTIME 04/01/16  Yes Scott T Weaver, PA-C  omeprazole (PRILOSEC) 40 MG capsule Take 40 mg by mouth daily.     Yes Historical Provider, MD     Vital Signs: BP (!) 149/45   Pulse (!) 59   Temp 98 F (36.7 C)   Resp 14   Wt 115 lb (52.2 kg)   SpO2 94%   BMI 20.37 kg/m   Physical Exam  HENT:  Head: Atraumatic.  Face symmetrical Smile = Tongue midline  Eyes: EOM are normal.  Neck: Neck supple.  Abdominal: Soft.  Musculoskeletal: Normal range of motion.  Neurological: She is alert.  Skin: Skin is warm and dry.  Rt groin NT no bleeding no hematoma Rtt foot 2+ pulses  Nursing note and vitals reviewed.   Imaging: No results found.  Labs:  CBC:  Recent Labs  03/31/16 1036 04/10/16 1030 04/11/16 0221 06/22/16 0949  WBC 9.3 7.8 6.5 8.0  HGB 12.2  11.8* 9.7* 12.7  HCT 36.8 36.0 30.4* 39.0  PLT 362 358 311 332    COAGS:  Recent Labs  06/22/16 0949  INR 1.10  APTT 31    BMP:  Recent Labs  03/31/16 1036 04/10/16 1030 04/11/16 0221 06/22/16 0949  NA 136 136 139 137  K 4.2 3.4* 2.8* 3.5  CL 101 102 107 102  CO2 28 29 26 26   GLUCOSE 93 98 91 85  BUN 10 7 <5* 9  CALCIUM 9.1 9.3 7.8* 9.8  CREATININE 0.96* 1.02* 0.73 0.93  GFRNONAA  --  53* >60 59*  GFRAA  --  >60 >60 >60    LIVER FUNCTION TESTS:  Recent Labs   04/10/16 1030 06/22/16 0949  BILITOT 0.4 0.5  AST 15 21  ALT 7* 12*  ALKPHOS 83 67  PROT 7.2 7.3  ALBUMIN 3.4* 3.9    Assessment and Plan:  Right middle cerebral artery aneurysm embolization Pipeline stent Admit to ICU - Neuro Plan for dc in am if stable  Electronically Signed: Curtina Grills A 06/29/2016, 3:38 PM   I spent a total of 15 Minutes at the the patient's bedside AND on the patient's hospital floor or unit, greater than 50% of which was counseling/coordinating care for R MCA aneurysm embo

## 2016-06-29 NOTE — Sedation Documentation (Signed)
Right femoral artery accessed

## 2016-06-29 NOTE — Anesthesia Postprocedure Evaluation (Signed)
Anesthesia Post Note  Patient: Barbara Hill  Procedure(s) Performed: Procedure(s) (LRB): EMBOLIZATION (N/A)  Patient location during evaluation: PACU Anesthesia Type: General Level of consciousness: awake, awake and alert and oriented Pain management: pain level controlled Vital Signs Assessment: post-procedure vital signs reviewed and stable Respiratory status: spontaneous breathing, nonlabored ventilation and respiratory function stable Cardiovascular status: blood pressure returned to baseline Anesthetic complications: no    Last Vitals:  Vitals:   06/29/16 1515 06/29/16 1545  BP:    Pulse: (!) 59 61  Resp: 14 17  Temp:      Last Pain:  Vitals:   06/29/16 0707  TempSrc: Oral                 Annetta Deiss COKER

## 2016-06-29 NOTE — Anesthesia Preprocedure Evaluation (Signed)
Anesthesia Evaluation  Patient identified by MRN, date of birth, ID band Patient awake    Reviewed: Allergy & Precautions, NPO status , Patient's Chart, lab work & pertinent test results  Airway Mallampati: I  TM Distance: >3 FB Neck ROM: Full    Dental   Pulmonary COPD, Current Smoker,    Pulmonary exam normal        Cardiovascular hypertension, Normal cardiovascular exam     Neuro/Psych Anxiety    GI/Hepatic   Endo/Other    Renal/GU      Musculoskeletal   Abdominal   Peds  Hematology   Anesthesia Other Findings   Reproductive/Obstetrics                             Anesthesia Physical Anesthesia Plan  ASA: III  Anesthesia Plan: MAC   Post-op Pain Management:    Induction: Intravenous  Airway Management Planned: Simple Face Mask  Additional Equipment: Arterial line  Intra-op Plan:   Post-operative Plan:   Informed Consent: I have reviewed the patients History and Physical, chart, labs and discussed the procedure including the risks, benefits and alternatives for the proposed anesthesia with the patient or authorized representative who has indicated his/her understanding and acceptance.     Plan Discussed with: CRNA and Surgeon  Anesthesia Plan Comments:         Anesthesia Quick Evaluation

## 2016-06-29 NOTE — Procedures (Signed)
S/p RT Vert artery angiogram and RT Common carotid arteriogram,follwed by  endovascular treateatment of wide neck RT MCA aneurysm with pipeline flex flow diverter.

## 2016-06-29 NOTE — Anesthesia Procedure Notes (Signed)
Procedure Name: Intubation Date/Time: 06/29/2016 11:20 AM Performed by: Gaylene Brooks Pre-anesthesia Checklist: Patient identified, Emergency Drugs available, Suction available and Patient being monitored Patient Re-evaluated:Patient Re-evaluated prior to inductionOxygen Delivery Method: Circle System Utilized Preoxygenation: Pre-oxygenation with 100% oxygen Intubation Type: IV induction Ventilation: Mask ventilation without difficulty Laryngoscope Size: Miller and 2 Grade View: Grade I Tube type: Oral Tube size: 7.0 mm Number of attempts: 1 Airway Equipment and Method: Stylet and LTA kit utilized Placement Confirmation: ETT inserted through vocal cords under direct vision,  positive ETCO2 and breath sounds checked- equal and bilateral Secured at: 21 cm Tube secured with: Tape Dental Injury: Teeth and Oropharynx as per pre-operative assessment

## 2016-06-29 NOTE — Transfer of Care (Signed)
Immediate Anesthesia Transfer of Care Note  Patient: Barbara Hill  Procedure(s) Performed: Procedure(s): EMBOLIZATION (N/A)  Patient Location: PACU  Anesthesia Type:General  Level of Consciousness: awake, alert  and oriented  Airway & Oxygen Therapy: Patient Spontanous Breathing and Patient connected to face mask oxygen  Post-op Assessment: Report given to RN, Post -op Vital signs reviewed and stable and Patient moving all extremities X 4  Post vital signs: Reviewed and stable  Last Vitals:  Vitals:   06/29/16 0707  BP: (!) 149/45  Pulse: 62  Resp: 18  Temp: 36.7 C    Last Pain:  Vitals:   06/29/16 0707  TempSrc: Oral         Complications: No apparent anesthesia complications

## 2016-06-29 NOTE — Sedation Documentation (Signed)
Family in, Dr Corliss Skainseveshwar explained findings and possible treatment options.  Agreeable to proceed with embolization.  Anesthesia preparing for general anesthesia.  Patient and family agreeable.

## 2016-06-29 NOTE — H&P (Signed)
Chief Complaint: Patient was seen in consultation today for cerebral arteriogram with Right middle cerebral artery aneurysm embolization at the request of Dr Naomie DeanAntonia Ahern  Referring Physician(s): Dr Frances MaywoodAntonia Ahern [facility]  Supervising Physician: Julieanne Cottoneveshwar, Sanjeev  [facility]  History of Present Illness: Barbara Hill is a 75 y.o. 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 No other intracranial aneurysm. No carotid bifurcation atherosclerotic disease. 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  Referred to Dr Corliss Skainseveshwar for evaluation and possible treatment Now scheduled for cerebral arteriogram with possible embolization of R MCA aneurysm   Past Medical History:  Diagnosis Date  . Anxiety   . Arrhythmia    palps  . COPD (chronic obstructive pulmonary disease) (HCC)   . Dizziness   . Fatigue   . H/O: hysterectomy   . Headache(784.0)   . Hypertension   . Mitral valve prolapse   . Radiculopathy   . SVT (supraventricular tachycardia) (HCC)   . UTI (lower urinary tract infection)     Past Surgical History:  Procedure Laterality Date  . ABDOMINAL HYSTERECTOMY    . APPENDECTOMY    . CARDIAC CATHETERIZATION  12/95  . CHOLECYSTECTOMY    . IR GENERIC HISTORICAL  06/02/2016   IR RADIOLOGIST EVAL & MGMT 06/02/2016 [facility]-INTERV RAD  . NASAL SEPTUM SURGERY    . ROTATOR CUFF REPAIR      Allergies: Dobutamine; Darvocet [propoxyphene n-acetaminophen]; Excedrin extra strength [aspirin-acetaminophen-caffeine];  Clarithromycin; Doxycycline; Penicillins; Prednisone; and Propoxyphene  Medications: Prior to Admission medications   Medication Sig Start Date End Date Taking? Authorizing Provider  acetaminophen (TYLENOL) 650 MG CR tablet Take 1,300 mg by mouth every 8 (eight) hours as needed for pain.   Yes Historical Provider, MD  acidophilus (RISAQUAD) CAPS capsule Take 1 capsule by mouth daily.   Yes Historical Provider, MD  ALPRAZolam [name](XANAX) 0.5 MG tablet Take 0.25-0.5 mg by mouth 3 (three) times daily.    Yes Historical Provider, MD  amLODipine (NORVASC) 10 MG tablet Take 1 tablet (10 mg total) by mouth daily. 05/01/16 07/30/16 Yes Pricilla RifflePaula V Ross, MD  butalbital-acetaminophen-caffeine (FIORICET, ESGIC) [phone] MG per tablet Take 1-2 tablets by mouth every 4 (four) hours as needed for headache or migraine.  07/06/14  Yes Historical Provider, MD  cholecalciferol (VITAMIN D) 1000 units tablet Take 1,000 Units by mouth daily.   Yes Historical Provider, MD  fexofenadine-pseudoephedrine (ALLEGRA-D) 60-120 MG 12 hr tablet Take 1 tablet by mouth daily as needed (allergies).    Yes Historical Provider, MD  flecainide (TAMBOCOR) 100 MG tablet Take 1 tablet (100 mg total) by mouth 2 (two) times daily. 03/31/16  Yes Scott T Alben SpittleWeaver, PA-C  FLUoxetine (PROZAC) 20 MG capsule Take 20 mg by mouth 2 (two) times daily. 06/07/16  Yes Historical Provider, MD  fluticasone (FLONASE) 50 MCG/ACT nasal spray Place 1 spray into both nostrils daily as needed for allergies or rhinitis.  10/11/14  Yes Historical Provider, MD  lactose free nutrition (BOOST) LIQD Take 237 mLs by mouth daily.  Yes Historical Provider, MD  lisinopril (PRINIVIL,ZESTRIL) 10 MG tablet Take 1 tablet (10 mg total) by mouth daily. 03/31/16  Yes Scott Moishe Spice, PA-C  meclizine (ANTIVERT) 25 MG tablet Take 12.5-25 mg by mouth 2 (two) times daily as needed for dizziness.  04/04/14  Yes Historical Provider, MD  metoprolol succinate (TOPROL-XL) 25 MG 24 hr tablet TAKE 1  TABLET BY MOUTH EVERY NIGHT AT BEDTIME 04/01/16  Yes Scott T Weaver, PA-C  omeprazole (PRILOSEC) 40 MG capsule Take 40 mg by mouth daily.     Yes Historical Provider, MD     Family History  Problem Relation Age of Onset  . Heart attack Mother   . Transient ischemic attack Mother   . Heart attack Father 38  . Heart attack Other   . Asthma Other   . Heart failure Other   . Osteoporosis Other   . Heart Problems Brother   . CAD      Social History   Social History  . Marital status: Widowed    Spouse name: N/A  . Number of children: 2  . Years of education: 12   Occupational History  . Retired    Social History Main Topics  . Smoking status: Current Every Day Smoker    Packs/day: 1.00    Years: 50.00  . Smokeless tobacco: Current User  . Alcohol use No  . Drug use: No  . Sexual activity: Not Asked   Other Topics Concern  . None   Social History Narrative   Lives with significant other, [name]    Caffeine use: none     Review of Systems: A 12 point ROS discussed and pertinent positives are indicated in the HPI above.  All other systems are negative.  Review of Systems  Constitutional: Positive for activity change and fatigue. Negative for appetite change and fever.  HENT: Negative for tinnitus, trouble swallowing and voice change.   Eyes: Negative for visual disturbance.  Respiratory: Positive for cough.   Cardiovascular: Negative for chest pain.  Gastrointestinal: Negative for abdominal pain.  Neurological: Positive for dizziness, weakness and headaches. Negative for tremors, seizures, syncope, facial asymmetry, speech difficulty, light-headedness and numbness.  Psychiatric/Behavioral: Negative for behavioral problems and confusion.    Vital Signs: BP (!) 149/45   Pulse 62   Temp 98.1 F (36.7 C) (Oral)   Resp 18   Wt 115 lb (52.2 kg)   SpO2 100%   BMI 20.37 kg/m   Physical Exam  Constitutional: She is oriented to person, place, and time.   Thin/frail Groggy from Xanax po this am  HENT:  Head: Atraumatic.  Eyes: EOM are normal.  Neck: Neck supple.  Cardiovascular: Normal rate, regular rhythm and normal heart sounds.   No murmur heard. Pulmonary/Chest: Effort normal and breath sounds normal. She has no wheezes.  Abdominal: Soft. Bowel sounds are normal. There is no tenderness.  Musculoskeletal: Normal range of motion.  Neurological: She is alert and oriented to person, place, and time.  Skin: Skin is warm and dry.  Psychiatric: She has a normal mood and affect. Her behavior is normal. Judgment and thought content normal.  Nursing note and vitals reviewed.   Mallampati Score:  MD Evaluation Airway: WNL Heart: WNL Abdomen: WNL Chest/ Lungs: WNL ASA  Classification: 3 Mallampati/Airway Score: Two  Imaging: Ir Radiologist Eval & Mgmt  Result Date: 06/04/2016 EXAM: NEW PATIENT OFFICE VISIT CHIEF COMPLAINT: Worsening headaches.  History of a recent fall. Current Pain Level: 1-10 HISTORY OF  PRESENT ILLNESS: The patient is a 75 year old right-handed lady who has been referred for evaluation of management of a recently discovered intracranial aneurysm. The patient is accompanied by a close friend of hers. Basically the patient reports that she fell in August of this year and seemingly hit her head. This was not associated with loss of consciousness. The patient did not have any seizures. The patient subsequently had a workup which entailed a CT of the brain, an MRI MRA of the brain and neck. The MRI MRA of the brain demonstrated the presence of an 8 mm right middle cerebral artery region intracranial aneurysm. During her workup at that time she also had symptoms of dizziness and vertigo. This reportedly has been attributed to orthostatic hypotension. However, the patient did not volunteer these symptoms during this interview. She denies any visual symptoms of diplopia, amaurosis fugax, blurring vision, hearing difficulties,  tinnitus, swallowing difficulties. She also denies any motor, sensory, speech or coordination or station and gait abnormalities. She denies any recent seizures or loss of consciousness spells. Past Medical History: Significant for anxiety, arrhythmias especially palpitations. History of COPD on account of her smoking history. Past history of hysterectomy, headaches as mentioned above. She also carries a history of hypertension, mitral valve prolapse, supraventricular tachycardia and a UTI. Past Surgical History: Abdominal hysterectomy, appendectomy, cardiac catheterization, cholecystectomy and rotator cuff repair. Current Medications: Acetaminophen as needed for headache, acidophilus, alprazolam, amlodipine, Fioricet, Allegra D, flecainide, Prozac, Flonase, lisinopril, meclizine, metoprolol, multi vitamins, Prilosec, vitamin-D, ergocalciferol. Allergies: Patient reports some form of allergy to the following medications: Dobutamine. Epinephrine causes severe palpitations and SVT. Darvocet causes nausea, anxiety and vomiting. Excedrin extra strength causes nausea and vomiting. Propoxyphene also causes nausea and vomiting. Clarithromycin, doxycycline, penicillin which causes nausea and vomiting. Prednisone which causes shaking and insomnia. Social History: Patient is widowed, has two children alive and well. Patient smokes up to a pack of cigarettes per day and has done so for many years. She denies drinking alcohol or using illicit chemicals. Family History: Mother deceased at age 74 with MI, however, had heart problems. A brother died of gastric cancer and involvement of the bile duct at age 74. One sister had multiple sclerosis. Mom also had a stroke. Questionable history of ruptured intracranial aneurysm in her grandmother. REVIEW OF SYSTEMS: Essentially negative for pathologic symptomatology except as mentioned above. PHYSICAL EXAMINATION: Appears in no acute distress.  Affect normal. Neurologically grossly  intact. ASSESSMENT AND PLAN: The patient's recent MRI MRA of brain was reviewed and discussed. This reveals the probability of an approximately 8 mm right middle cerebral artery bifurcation region aneurysm. The natural history of unruptured intracranial aneurysms was reviewed in detail. The risks of rupture of 1-2% per year with increased potential risks in females, smokers, hypertension, hyperlipidemia and also in patients with family history of intracranial aneurysms. The attendant morbidity and mortality with rupture were all reviewed in detail. Options discussed regarding treatment were those of elimination of the aneurysm from the circulation by endovascular treatment, versus consideration of surgical clipping with craniotomy, versus continued close surveillance with MRI MRA of the brain at 6 months to yearly intervals. The patient has expressed her desire to have her aneurysm treated endovascularly. The procedure, the risks, the benefits were all reviewed in detail. The endovascular options would be dictated by the actual angioarchitecture of the aneurysm at the time of formal catheter angiography of the cerebral vessels. Potential options may include primary coiling versus coiling with stent assistance, versus placement of a  flow diverter device. The patient would have to be started on aspirin and Plavix approximately 7 days prior to the procedure. A diagnostic catheter angiogram would be performed with anesthesia ready to put her under general anesthesia for the aneurysm to be treated endovascularly at the same setting. Risk of thromboembolic stroke of 1-2 percent, a remote possibility of intra procedural rupture with need for emergent neurosurgical intervention and death were all discussed in detail. Questions were answered to their satisfaction. Patient would like to proceed with the diagnostic catheter angiogram with intent to treat at the same setting. This will be arranged in the first week of November  probably November 6. The patient leaves with good understanding and agreement with the above management plan. She has been asked to call should she have any concerns or questions. Electronically Signed   By: [name] M.D.   On: 06/02/2016 18:41    Labs:  CBC:  Recent Labs  03/31/16 1036 04/10/16 1030 04/11/16 0221 06/22/16 0949  WBC 9.3 7.8 6.5 8.0  HGB 12.2 11.8* 9.7* 12.7  HCT 36.8 36.0 30.4* 39.0  PLT 362 358 311 332    COAGS:  Recent Labs  06/22/16 0949  INR 1.10  APTT 31    BMP:  Recent Labs  03/31/16 1036 04/10/16 1030 04/11/16 0221 06/22/16 0949  NA 136 136 139 137  K 4.2 3.4* 2.8* 3.5  CL 101 102 107 102  CO2 28 29 26 26   GLUCOSE 93 98 91 85  BUN 10 7 <5* 9  CALCIUM 9.1 9.3 7.8* 9.8  CREATININE 0.96* 1.02* 0.73 0.93  GFRNONAA  --  53* >60 59*  GFRAA  --  >60 >60 >60    LIVER FUNCTION TESTS:  Recent Labs  04/10/16 1030 06/22/16 0949  BILITOT 0.4 0.5  AST 15 21  ALT 7* 12*  ALKPHOS 83 67  PROT 7.2 7.3  ALBUMIN 3.4* 3.9    TUMOR MARKERS: No results for input(s): AFPTM, CEA, CA199, CHROMGRNA in the last 8760 hours.  Assessment and Plan:  Right middle cerebral artery aneurysm Pt with dizziness and hx of falls at home Now scheduled for cerebral arteriogram with possible embolization of aneurysm Risks and Benefits discussed with the patient including, but not limited to bleeding, infection, vascular injury, contrast induced renal failure, stroke or even death. All of the patient's questions were answered, patient is agreeable to proceed. Consent signed and in chart. Pt and daughter aware if intervention is performed, pt will be admitted into Neuro ICU overnight. Plan for discharge in am  Thank you for this interesting consult.  I greatly enjoyed meeting Barbara Hill and look forward to participating in their care.  A copy of this report was sent to the requesting provider on this date.  Electronically Signed: Anishka Bushard  A 06/29/2016, 8:19 AM   I spent a total of  30 Minutes   in face to face in clinical consultation, greater than 50% of which was counseling/coordinating care for cerebral arteriogram with poss R MCA aneurysm embolization

## 2016-06-29 NOTE — Sedation Documentation (Signed)
Procedure resumed.

## 2016-06-29 NOTE — Progress Notes (Addendum)
Dr. Corliss Skainseveshwar called to check on pt. Updated on Pt's vitals (dayshift and nightshift) by A-line and cuff. Informed MD on dampen A-line and off readings. No change in pt neuro status or groin status. Informed MD of Left arm soreness from Aline area. Discussed medications administered. Order to give 250cc NS bolus x1 for BP and keep cuff SBP>100. If BP gets below limit, to stimulate pt to raise BP. Will continue to monitor.  ~9604~2345: MD called back. Completed neuro assessment with MD on phone. No change in neuro status. Pt continues to c/o Left wrist to forearm soreness from A-Line placement and unable to move as well as Right arm. No complaints of numbness or tingling. No further orders. Will continue to monitor closely.

## 2016-06-29 NOTE — Sedation Documentation (Signed)
29F Exoseal deployed. H Falls,IR Tech holding pressure to right groin at this time. Site is unremarkable at this time.

## 2016-06-30 ENCOUNTER — Inpatient Hospital Stay (HOSPITAL_COMMUNITY): Payer: Medicare HMO

## 2016-06-30 ENCOUNTER — Encounter (HOSPITAL_COMMUNITY): Payer: Self-pay | Admitting: Radiology

## 2016-06-30 DIAGNOSIS — F17200 Nicotine dependence, unspecified, uncomplicated: Secondary | ICD-10-CM

## 2016-06-30 DIAGNOSIS — M79609 Pain in unspecified limb: Secondary | ICD-10-CM

## 2016-06-30 DIAGNOSIS — I671 Cerebral aneurysm, nonruptured: Secondary | ICD-10-CM

## 2016-06-30 DIAGNOSIS — D62 Acute posthemorrhagic anemia: Secondary | ICD-10-CM

## 2016-06-30 DIAGNOSIS — I951 Orthostatic hypotension: Secondary | ICD-10-CM

## 2016-06-30 LAB — CBC WITH DIFFERENTIAL/PLATELET
BASOS ABS: 0 10*3/uL (ref 0.0–0.1)
BASOS PCT: 0 %
EOS PCT: 1 %
Eosinophils Absolute: 0.1 10*3/uL (ref 0.0–0.7)
HEMATOCRIT: 19.7 % — AB (ref 36.0–46.0)
Hemoglobin: 6.5 g/dL — CL (ref 12.0–15.0)
Lymphocytes Relative: 27 %
Lymphs Abs: 2.6 10*3/uL (ref 0.7–4.0)
MCH: 29.8 pg (ref 26.0–34.0)
MCHC: 33 g/dL (ref 30.0–36.0)
MCV: 90.4 fL (ref 78.0–100.0)
MONO ABS: 0.5 10*3/uL (ref 0.1–1.0)
MONOS PCT: 5 %
NEUTROS ABS: 6.3 10*3/uL (ref 1.7–7.7)
Neutrophils Relative %: 66 %
PLATELETS: 205 10*3/uL (ref 150–400)
RBC: 2.18 MIL/uL — ABNORMAL LOW (ref 3.87–5.11)
RDW: 13 % (ref 11.5–15.5)
WBC: 9.6 10*3/uL (ref 4.0–10.5)

## 2016-06-30 LAB — POCT I-STAT, CHEM 8
BUN: 8 mg/dL (ref 6–20)
CHLORIDE: 111 mmol/L (ref 101–111)
CREATININE: 0.7 mg/dL (ref 0.44–1.00)
Calcium, Ion: 1.13 mmol/L — ABNORMAL LOW (ref 1.15–1.40)
GLUCOSE: 106 mg/dL — AB (ref 65–99)
HCT: 18 % — ABNORMAL LOW (ref 36.0–46.0)
Hemoglobin: 6.1 g/dL — CL (ref 12.0–15.0)
POTASSIUM: 4 mmol/L (ref 3.5–5.1)
Sodium: 141 mmol/L (ref 135–145)
TCO2: 22 mmol/L (ref 0–100)

## 2016-06-30 LAB — BASIC METABOLIC PANEL
Anion gap: 5 (ref 5–15)
BUN: 8 mg/dL (ref 6–20)
CALCIUM: 7.5 mg/dL — AB (ref 8.9–10.3)
CO2: 21 mmol/L — AB (ref 22–32)
CREATININE: 0.75 mg/dL (ref 0.44–1.00)
Chloride: 113 mmol/L — ABNORMAL HIGH (ref 101–111)
GFR calc Af Amer: >60 mL/min (ref >60)
GLUCOSE: 103 mg/dL — AB (ref 65–99)
Potassium: 3.9 mmol/L (ref 3.5–5.1)
Sodium: 139 mmol/L (ref 135–145)

## 2016-06-30 LAB — CBC
HCT: 19.7 % — ABNORMAL LOW (ref 36.0–46.0)
HEMOGLOBIN: 6.6 g/dL — AB (ref 12.0–15.0)
MCH: 30.4 pg (ref 26.0–34.0)
MCHC: 33.5 g/dL (ref 30.0–36.0)
MCV: 90.8 fL (ref 78.0–100.0)
PLATELETS: 202 10*3/uL (ref 150–400)
RBC: 2.17 MIL/uL — AB (ref 3.87–5.11)
RDW: 13 % (ref 11.5–15.5)
WBC: 9.5 10*3/uL (ref 4.0–10.5)

## 2016-06-30 LAB — GLUCOSE, CAPILLARY
GLUCOSE-CAPILLARY: 82 mg/dL (ref 65–99)
GLUCOSE-CAPILLARY: 94 mg/dL (ref 65–99)

## 2016-06-30 LAB — ABO/RH: ABO/RH(D): AB POS

## 2016-06-30 LAB — LIPID PANEL
CHOLESTEROL: 97 mg/dL (ref 0–200)
HDL: 33 mg/dL — AB (ref >40)
LDL Cholesterol: 45 mg/dL (ref 0–99)
TRIGLYCERIDES: 97 mg/dL (ref <150)
Total CHOL/HDL Ratio: 2.9 RATIO
VLDL: 19 mg/dL (ref 0–40)

## 2016-06-30 LAB — PREPARE RBC (CROSSMATCH)

## 2016-06-30 LAB — HEPARIN LEVEL (UNFRACTIONATED): Heparin Unfractionated: 0.24 IU/mL — ABNORMAL LOW (ref 0.30–0.70)

## 2016-06-30 LAB — HEMOGLOBIN AND HEMATOCRIT, BLOOD
HEMATOCRIT: 24.6 % — AB (ref 36.0–46.0)
Hemoglobin: 8.2 g/dL — ABNORMAL LOW (ref 12.0–15.0)

## 2016-06-30 LAB — PLATELET INHIBITION P2Y12: Platelet Function  P2Y12: 146 [PRU] — ABNORMAL LOW (ref 194–418)

## 2016-06-30 MED ORDER — CLOPIDOGREL BISULFATE 75 MG PO TABS
75.0000 mg | ORAL_TABLET | Freq: Once | ORAL | Status: AC
  Administered 2016-06-30: 75 mg via ORAL
  Filled 2016-06-30: qty 1

## 2016-06-30 MED ORDER — SODIUM CHLORIDE 0.9 % IV BOLUS (SEPSIS)
250.0000 mL | Freq: Once | INTRAVENOUS | Status: AC
  Administered 2016-06-30: 250 mL via INTRAVENOUS

## 2016-06-30 MED ORDER — IOPAMIDOL (ISOVUE-370) INJECTION 76%
INTRAVENOUS | Status: AC
  Administered 2016-06-30: 50 mL
  Filled 2016-06-30: qty 50

## 2016-06-30 MED ORDER — SODIUM CHLORIDE 0.9 % IV SOLN
Freq: Once | INTRAVENOUS | Status: AC
  Administered 2016-06-30: 06:00:00 via INTRAVENOUS

## 2016-06-30 MED ORDER — CLOPIDOGREL BISULFATE 75 MG PO TABS: 75.0000 mg | ORAL_TABLET | Freq: Every day | ORAL | Status: DC

## 2016-06-30 NOTE — Progress Notes (Signed)
ANTICOAGULATION CONSULT NOTE - Follow-up Consult  Pharmacy Consult for Heparin Indication: s/p R MCA aneurysm embolization  Allergies  Allergen Reactions  . Dobutamine Other (See Comments)    Heart beating hard with CP, neck pain, and weakness  . Darvocet [Propoxyphene N-Acetaminophen] Nausea And Vomiting  . Excedrin Extra Strength [Aspirin-Acetaminophen-Caffeine] Nausea And Vomiting  . Clarithromycin Other (See Comments)    Yeast infection  . Doxycycline Other (See Comments)    Makes stomach hurt  . Penicillins Other (See Comments)    Yeast infection  Has patient had a PCN reaction causing immediate rash, facial/tongue/throat swelling, SOB or lightheadedness with hypotension: No Has patient had a PCN reaction causing severe rash involving mucus membranes or skin necrosis: No Has patient had a PCN reaction that required hospitalization No Has patient had a PCN reaction occurring within the last 10 years: No If all of the above answers are "NO", then may proceed with Cephalosporin use.   . Prednisone Other (See Comments)    Shakes   . Propoxyphene Nausea And Vomiting   Patient Measurements: Height: 5\' 3"  (160 cm) Weight: 132 lb 7.9 oz (60.1 kg) IBW/kg (Calculated) : 52.4  Vital Signs: Temp: 97.8 F (36.6 C) (11/07 0000) Temp Source: Oral (11/07 0000) BP: 100/48 (11/06 2300) Pulse Rate: 66 (11/06 2300)  Labs:  Recent Labs  06/30/16 0030  HEPARINUNFRC 0.24*    Estimated Creatinine Clearance: 43.9 mL/min (by C-G formula based on SCr of 0.93 mg/dL).   Assessment: Barbara Hill is a 75 y.o. female who underwent cerebral arteriogram with embolization of right MCA aneurysm by Dr. Corliss Skainseveshwar. Pt on heparin following procedure. Heparin level 0.24 (therapeutic) on gtt at 500 units/hr. No bleeding noted. Heparin to be off at 0700.  Goal of Therapy:  Heparin level 0.1-0.25 units/ml Monitor platelets by anticoagulation protocol: Yes   Plan:  Continue heparin at 500  units/hr  Heparin off at 0700 - no further levels needed  Christoper Fabianaron Napoleon Monacelli, PharmD, BCPS Clinical pharmacist, pager [phone] 06/30/2016,1:32 AM

## 2016-06-30 NOTE — Progress Notes (Signed)
CRITICAL VALUE ALERT  Critical value received:  Hemoglobin: 6.6  Date of notification:  06/30/2016  Time of notification:  0415  Critical value read back:No.  Nurse who received alert:  Fransisco HertzJones, Arshawn Valdez D, RN  MD notified (1st page):  Dr. Corliss Skainseveshwar  Time of first page:  [phone]  MD notified (2nd page):  Time of second page:  Responding MD:  Dr. Corliss Skainseveshwar  Time MD responded:  [phone]  Critical value called at 0320 of Hemoglobin 6.5. Reordered CBC to recheck value.  Dr. Corliss Skainseveshwar notified of code stroke cancel, CT (-) per Dr. Otelia LimesLindzen, vitals, and Dr. Otelia LimesLindzen recommendation of increasing Normal saline to 13425ml/hr to help support BP.  Order to type and screen pt, transfuse 1unit PRBC, and increase NS to 90cc/hr.  Pt updated, will continue to monitor.

## 2016-06-30 NOTE — Progress Notes (Signed)
**  Preliminary report by tech**  Right arterial pseudoaneurysm surveillance.  The study was technically limited due to edema, depth of vessels, patient pain tolerance, and acoustic shadow.  There is no evidence of pseudoaneurysm involving the right common femoral artery, and superficial femoral artery. There is evidence of low velocity unidirectional collateral flow of unknown etiology in the right femoral artery in the area of the groin.   06/30/16 3:11 PM Olen CordialGreg Shanea Karney RVT

## 2016-06-30 NOTE — Progress Notes (Signed)
~   0138: Noted BP to continue to run soft after 250cc NS bolus. In to stimulate and assess pt. Noted Left side slightly weaker than previous assessment. BP re-taken, noted to be 96/46. Pt only has complaint of slight pain to Left 2nd digit. She also states slight weakness to left side. No other deficits noticed.  MD called, notified of RN's concerns of BP. Instructed to call Code stroke on pt and administer 250cc NS bolus for BP of 96/46.  Code stroke called.  Dr. Otelia LimesLindzen called. Updated on pt's hospital course, assessment, vitals, and Dr. Fatima Sangereveshwar's orders.  Order for CT head, CT angi of head and neck and will meet pt and RN in CT scanner. Noted pt's NIH to be 0. No complaints. In CT, left side appears to be back to baseline strength (beginning of shift).

## 2016-06-30 NOTE — Progress Notes (Signed)
Foley catheter removed at 0836. Pt due to void by 1430. Will continue to monitor.Kimberla Driskill, [place] ScrapeSarah E, RN

## 2016-06-30 NOTE — Progress Notes (Signed)
Patient ID: Barbara Hill, female   DOB: 12/10/1940, 75 y.o.   MRN: [id number] INR . Asmptomatic. Stable clinically. No new symptoms of weakness or speech difficulties. Eating.Ambulated with assistence. VS stable BP 130s/70s HR 70s SR. Plan .advance diet.Encourage oral liquids. IV saline to continue. Will give extra x1 plavix 75 mgFatima Sanger.. S. Tychelle Purkey MD

## 2016-06-30 NOTE — Progress Notes (Signed)
STROKE TEAM PROGRESS NOTE   HISTORY OF PRESENT ILLNESS (per record) Barbara Hill is an 75 y.o. female who initially presented for headache and bilateral hip pain following fall at home approximately 3 months ago. Her falls were attributed to dizziness and of note, BPs have been low during this admission. Work up of pain included MRI brain, which revealed a right MCA bifurcation aneurysm, 8 mm diameter. 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 She was referred to Dr. Corliss Skainseveshwar for possible endovascular treatment. On 11/6 she underwent a right vertebral artery angiogram and right common carotid arteriogram, followed by endovascular treatment of wide neck right MCA aneurysm with pipeline flex flow diverter. Will be on ASA and Plavix. Post-procedure on heparin drip. Early this AM 06/24/2016, she experienced mild left sided weakness in the context of low BP. Code stroke was initiated. Patient was not administered IV t-PA.    SUBJECTIVE (INTERVAL HISTORY) Pt RN is at bedside. She converse well. However, she complains of left shoulder pain which makes her left arm weak. She denies any should pain before procedure yesterday. Overnight has episodes of left arm weakness but was told resolved. She has pipeline stent yesterday for right MCA aneurysm.    OBJECTIVE Temp:  [97.8 F (36.6 C)-98.5 F (36.9 C)] 98.2 F (36.8 C) (11/07 0800) Pulse Rate:  [58-69] 69 (11/07 0815) Cardiac Rhythm: Sinus bradycardia (11/07 0000) Resp:  [7-21] 21 (11/07 0815) BP: (70-132)/(42-87) 132/53 (11/07 0800) SpO2:  [85 %-99 %] 97 % (11/07 0815) Arterial Line BP: (79-163)/(37-74) 161/57 (11/07 0815) Weight:  [60.1 kg (132 lb 7.9 oz)] 60.1 kg (132 lb 7.9 oz) (11/06 1745)  CBC:  Recent Labs Lab 06/30/16 0230 06/30/16 0320  WBC 9.6 9.5  NEUTROABS 6.3  --   HGB 6.5* 6.6*  HCT 19.7* 19.7*  MCV 90.4 90.8  PLT 205 202    Basic Metabolic Panel:  Recent Labs Lab 06/30/16 0230  NA 139  K 3.9  CL 113*  CO2 21*   GLUCOSE 103*  BUN 8  CREATININE 0.75  CALCIUM 7.5*   Lipid Panel     Component Value Date/Time   CHOL 199 07/21/2013 1456   TRIG 104.0 07/21/2013 1456   HDL 63.60 07/21/2013 1456   CHOLHDL 3 07/21/2013 1456   VLDL 20.8 07/21/2013 1456   LDLCALC 115 (H) 07/21/2013 1456     IMAGING I have personally reviewed the radiological images below and agree with the radiology interpretations.  Ct Angio Head W Or Wo Contrast Ct Angio Neck W Or Wo Contrast 06/30/2016 1. No intracranial arterial occlusion. 2. Flow diverting stent within the distal M1 segment right MCA. Persistent opacification of the MCA bifurcation aneurysm sac, which is not unexpected at this time. 3. Normal cervical carotid and vertebral arteries. 4. Moderate right subclavian artery stenosis, distal to the right vertebral artery origin, measuring approximately 60%.   Ct Head Code Stroke Wo Contrast` 06/30/2016  1. No acute intracranial abnormality. Status post placement of right MCA stent. 2. ASPECTS is 10.   MRI pending    PHYSICAL EXAM  Temp:  [97.8 F (36.6 C)-98.5 F (36.9 C)] 97.9 F (36.6 C) (11/07 1200) Pulse Rate:  [58-71] 66 (11/07 1600) Resp:  [7-21] 18 (11/07 1600) BP: (70-162)/(42-127) 132/53 (11/07 1600) SpO2:  [85 %-98 %] 96 % (11/07 1600) Arterial Line BP: (79-163)/(37-74) 131/60 (11/07 0900) Weight:  [132 lb 7.9 oz (60.1 kg)] 132 lb 7.9 oz (60.1 kg) (11/06 1745)  General - Well nourished, well developed,  in no apparent distress.  Ophthalmologic - Fundi not visualized due to noncooperation.  Cardiovascular - Regular rate and rhythm.  Mental Status -  Level of arousal and orientation to time, place, and person were intact. Language including expression, naming, repetition, comprehension was assessed and found intact. Fund of Knowledge was assessed and was intact.  Cranial Nerves II - XII - II - Visual field intact OU. III, IV, VI - Extraocular movements intact. V - Facial sensation intact  bilaterally. VII - Facial movement intact bilaterally. VIII - Hearing & vestibular intact bilaterally. X - Palate elevates symmetrically. XI - Chin turning & shoulder shrug intact bilaterally. XII - Tongue protrusion intact.  Motor Strength - The patient's strength was normal in all extremities except left UE proximal 4-/5 due to left shoulder pain as per pt and pronator drift was present on the left.  Bulk was normal and fasciculations were absent.   Motor Tone - Muscle tone was assessed at the neck and appendages and was normal.  Reflexes - The patient's reflexes were 1+ in all extremities and she had no pathological reflexes.  Sensory - Light touch, temperature/pinprick were assessed and were symmetrical.    Coordination - The patient had normal movements in the hands with no ataxia or dysmetria.  Tremor was absent.  Gait and Station - deferred.   ASSESSMENT/PLAN Ms. Barbara HazelBrenda M Mormile is a 75 y.o. 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 Post op, developed transient L sided weakness in setting of hypotension.   R brain TIA in setting of hypotension and blood loss s/p R MCA pipeline stent placement.   Resultant - left arm weakness due to left shoulder pain  CTA head & neck - No LVO, flow diverting stent in R M1 MCA, right MCA aneurysm  MRI  pending  TTE EF 60-65% in 03/2016 for fall work up  LDL 45  SCDs for VTE prophylaxis  Diet Heart Room service appropriate? Yes; Fluid consistency: Thin  aspirin 81 mg daily and clopidogrel 75 mg daily prior to admission, treated with IV heparin post op, now on aspirin 325 mg daily and clopidogrel 75 mg daily. Continue DAPT on discharge.  Patient counseled to be compliant with her antithrombotic medications  Ongoing aggressive stroke risk factor management  Therapy recommendations:  pending   Disposition:  pending   Hypotension Hx orthostatic hypotension  As low as 70/45  Improved  this am at 132/53  Need close follow up with PCP  Acute blood loss anemia may contribute to hypotension  Acute blood loss anemia  Hgb 12.7->6.6->8.2  Related to procedure  S/p PRBC  Tobacco abuse  Current smoker  Smoking cessation counseling provided  Pt is willing to quit  Other Stroke Risk Factors  Advanced age  Hx of SVT  Other Active Problems    Hospital day # 1  This patient is critically ill due to aneurysm s/p pipeline stent, hypotension, acute blood loss, severe anemia and at significant risk of neurological worsening, death form recurrent stroke, shock, cerebral hemorrhage. This patient's care requires constant monitoring of vital signs, hemodynamics, respiratory and cardiac monitoring, review of multiple databases, neurological assessment, discussion with family, other specialists and medical decision making of high complexity. I spent 35 minutes of neurocritical care time in the care of this patient.  Marvel PlanJindong Jiselle Sheu, MD PhD Stroke Neurology 06/30/2016 4:50 PM    To contact Stroke Continuity provider, please refer to [email or web address]. After hours, contact General Neurology

## 2016-06-30 NOTE — H&P (Signed)
NEURO HOSPITALIST CONSULT NOTE   Requestig physician: Dr. Titus Dubin   Reason for Consult: Acute onset of left sided weakness   History obtained from:   Patient and Chart     HPI:                                                                                                                                          Barbara Hill is an 75 y.o. female who initially presented for headache and bilateral hip pain following fall at home approximately 3 months ago. Her falls were attributed to dizziness and of note, BPs have been low during this admission. Work up of pain included MRI brain, which revealed a right MCA bifurcation aneurysm, 8 mm diameter. 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 She was referred to Dr. [name] for possible endovascular treatment. On 11/6 she underwent a right vertebral artery angiogram and right common carotid arteriogram, followed by endovascular treatment of wide neck right MCA aneurysm with pipeline flex flow diverter. Will be on ASA and Plavix starting tomorrow. Currently on post-procedure heparin drip. Early this AM, she experienced mild left sided weakness in the context of low BP. Code stroke was initiated.   Past Medical History:  Diagnosis Date  . Anxiety   . Arrhythmia    palps  . COPD (chronic obstructive pulmonary disease) (HCC)   . Dizziness   . Fatigue   . H/O: hysterectomy   . Headache(784.0)   . Hypertension   . Mitral valve prolapse   . Radiculopathy   . SVT (supraventricular tachycardia) (HCC)   . UTI (lower urinary tract infection)     Past Surgical History:  Procedure Laterality Date  . ABDOMINAL HYSTERECTOMY    . APPENDECTOMY    . CARDIAC CATHETERIZATION  12/95  . CHOLECYSTECTOMY    . IR GENERIC HISTORICAL  06/02/2016   IR RADIOLOGIST EVAL & MGMT 06/02/2016 [facility]-INTER[facility] RAD  . NASAL SEPTUM SURGERY    . ROTATOR CUFF REPAIR      Family History  Problem Relation Age of Onset  . Heart attack Mother    . Transient ischemic attack Mother   . Heart attack Father 49  . Heart attack Other   . Asthma Other   . Heart failure Other   . Osteoporosis Other   . Heart Problems Brother   . CAD      Social History:  reports that she has been smoking.  She has a 50.00 pack-year smoking history. She uses smokeless tobacco. She reports that she does not drink alcohol or use drugs. Correction: She states that she does not use smokeless tobacco.   Allergies  Allergen Reactions  . Dobutamine Other (See Comments)    Heart beating hard with CP, neck pain, and weakness  .  Darvocet [Propoxyphene N-Acetaminophen] Nausea And Vomiting  . Excedrin Extra Strength [Aspirin-Acetaminophen-Caffeine] Nausea And Vomiting  . Clarithromycin Other (See Comments)    Yeast infection  . Doxycycline Other (See Comments)    Makes stomach hurt  . Penicillins Other (See Comments)    Yeast infection  Has patient had a PCN reaction causing immediate rash, facial/tongue/throat swelling, SOB or lightheadedness with hypotension: No Has patient had a PCN reaction causing severe rash involving mucus membranes or skin necrosis: No Has patient had a PCN reaction that required hospitalization No Has patient had a PCN reaction occurring within the last 10 years: No If all of the above answers are "NO", then may proceed with Cephalosporin use.   . Prednisone Other (See Comments)    Shakes   . Propoxyphene Nausea And Vomiting    MEDICATIONS:                                                                                                                     No current facility-administered medications on file prior to encounter.    Current Outpatient Prescriptions on File Prior to Encounter  Medication Sig Dispense Refill  . acidophilus (RISAQUAD) CAPS capsule Take 1 capsule by mouth daily.    Marland Kitchen ALPRAZolam (XANAX) 0.5 MG tablet Take 0.5 mg by mouth 3 (three) times daily.     Marland Kitchen amLODipine (NORVASC) 10 MG tablet Take 1 tablet  (10 mg total) by mouth daily. 30 tablet 6  . butalbital-acetaminophen-caffeine (FIORICET, ESGIC) 50-325-40 MG per tablet Take 1-2 tablets by mouth every 4 (four) hours as needed for headache or migraine.   3  . fexofenadine-pseudoephedrine (ALLEGRA-D) 60-120 MG 12 hr tablet Take 1 tablet by mouth daily as needed (allergies).     . flecainide (TAMBOCOR) 100 MG tablet Take 1 tablet (100 mg total) by mouth 2 (two) times daily. 60 tablet 11  . fluticasone (FLONASE) 50 MCG/ACT nasal spray Place 1 spray into both nostrils daily as needed for allergies or rhinitis.   11  . lisinopril (PRINIVIL,ZESTRIL) 10 MG tablet Take 1 tablet (10 mg total) by mouth daily. (Patient taking differently: Take 10 mg by mouth 2 (two) times daily. ) 30 tablet 11  . metoprolol succinate (TOPROL-XL) 25 MG 24 hr tablet TAKE 1 TABLET BY MOUTH EVERY NIGHT AT BEDTIME 90 tablet 3  . omeprazole (PRILOSEC) 40 MG capsule Take 40 mg by mouth daily.         Current Facility-Administered Medications:  .  0.9 %  sodium chloride infusion, , Intravenous, Continuous, [name], MD, Last Rate: 75 mL/hr at 06/30/16 0009 .  acidophilus (RISAQUAD) capsule 1 capsule, 1 capsule, Oral, Daily, Ralene Muskrat, PA-C .  ALPRAZolam [name]) tablet 0.25-0.5 mg, 0.25-0.5 mg, Oral, TID, Ralene Muskrat, PA-C, 0.5 mg at 06/29/16 2158 .  amLODipine (NORVASC) tablet 10 mg, 10 mg, Oral, Daily, Ralene Muskrat, PA-C .  aspirin tablet 325 mg, 325 mg, Oral, Q breakfast, [name], MD .  butalbital-acetaminophen-caffeine (FIORICET, ESGIC) 50-325-40 MG per tablet  1-2 tablet, 1-2 tablet, Oral, Q4H PRN, Ralene MuskratPamela Turpin, PA-C, 2 tablet at 06/29/16 2027 .  cholecalciferol (VITAMIN D) tablet 1,000 Units, 1,000 Units, Oral, Daily, Ralene MuskratPamela Turpin, PA-C .  clopidogrel (PLAVIX) tablet 75 mg, 75 mg, Oral, Q breakfast, Julieanne CottonSanjeev Deveshwar, MD .  flecainide (TAMBOCOR) tablet 100 mg, 100 mg, Oral, BID, Ralene MuskratPamela Turpin, PA-C, 100 mg at 06/29/16 2158 .  FLUoxetine (PROZAC)  capsule 20 mg, 20 mg, Oral, BID, Ralene MuskratPamela Turpin, PA-C, 20 mg at 06/29/16 2158 .  fluticasone (FLONASE) 50 MCG/ACT nasal spray 1 spray, 1 spray, Each Nare, Daily PRN, Ralene MuskratPamela Turpin, PA-C .  heparin 100-0.45 UNIT/ML-% infusion, , , ,  .  heparin ADULT infusion 100 units/mL (25000 units/25050mL sodium chloride 0.45%), 500 Units/hr, Intravenous, Continuous **AND** heparin per pharmacy consult, , , Until Discontinued, Julieanne CottonSanjeev Deveshwar, MD .  HYDROmorphone (DILAUDID) 2 MG/ML injection, , , ,  .  lactated ringers infusion, , Intravenous, Continuous, Arta BruceKevin Ossey, MD .  lactose free nutrition (Boost) liquid 237 mL, 237 mL, Oral, Daily, Ralene MuskratPamela Turpin, PA-C, 237 mL at 06/29/16 2200 .  lisinopril (PRINIVIL,ZESTRIL) tablet 10 mg, 10 mg, Oral, Daily, Ralene MuskratPamela Turpin, PA-C .  loratadine (CLARITIN) tablet 10 mg, 10 mg, Oral, Daily, Ralene MuskratPamela Turpin, PA-C, 10 mg at 06/29/16 2158 .  meclizine (ANTIVERT) tablet 12.5-25 mg, 12.5-25 mg, Oral, BID PRN, Ralene MuskratPamela Turpin, PA-C .  metoprolol succinate (TOPROL-XL) 24 hr tablet 25 mg, 25 mg, Oral, QHS, Ralene MuskratPamela Turpin, PA-C .  nicardipine (CARDENE) 20mg  in 0.86% saline 200ml IV infusion (0.1 mg/ml), 5-15 mg/hr, Intravenous, Continuous, Julieanne CottonSanjeev Deveshwar, MD .  niMODipine (NIMOTOP) capsule 0-60 mg, 0-60 mg, Oral, 60 min Pre-Op, Ralene MuskratPamela Turpin, PA-C .  ondansetron (ZOFRAN) injection 4 mg, 4 mg, Intravenous, Q6H PRN, Julieanne CottonSanjeev Deveshwar, MD .  pantoprazole (PROTONIX) EC tablet 40 mg, 40 mg, Oral, Daily, Ralene MuskratPamela Turpin, PA-C .  sodium chloride 0.9 % bolus 250 mL, 250 mL, Intravenous, Once, Sanjeev Deveshwar, MD .  sodium chloride 0.9 % bolus 250 mL, 250 mL, Intravenous, Once, Julieanne CottonSanjeev Deveshwar, MD  Facility-Administered Medications Ordered in Other Encounters:  .  0.9 %  sodium chloride infusion, , Intravenous, Continuous, Ralene MuskratPamela Turpin, PA-C .  eptifibatide (INTEGRILIN) injection, , , PRN, Julieanne CottonSanjeev Deveshwar, MD, 1.8 mg at 06/29/16 1312 .  lidocaine (XYLOCAINE) 1 % (with pres) injection, , ,  PRN, Julieanne CottonSanjeev Deveshwar, MD, 8 mL at 06/29/16 1234 .  nitroGLYCERIN 25 mcg in sodium chloride 0.9 % 59.71 mL (25 mcg/mL) syringe, , Intra-arterial, PRN, Julieanne CottonSanjeev Deveshwar, MD, 25 mcg at 06/29/16 1240   ROS:                                                                                                                                       History obtained from patient. Some left foot pain today. Denies weakness currently.    Blood pressure (!) 96/46, pulse 65, temperature 97.8 F (36.6 C), temperature source Oral, resp. rate  13, height 5\' 3"  (1.6 m), weight 60.1 kg (132 lb 7.9 oz), SpO2 97 %.   Neurologic Examination:                                                                                                      HEENT-  Normocephalic, atraumatic. Lungs- No gross wheezes, respirations unlabored.  Extremities- Warm and well-perfused  Neurological Examination Mental Status: Alert, oriented, thought content appropriate.  Speech fluent without evidence of aphasia.  Able to follow all commands without difficulty. Cranial Nerves: II: Visual fields grossly normal, pupils equal, round, reactive to light III,IV, VI: ptosis not present, extra-ocular motions intact bilaterally with saccadic visual pursuits noted. V,VII: smile symmetric, facial light touch sensation normal bilaterally VIII: hearing intact to conversation bilaterally IX,X: Some hoarseness noted.  XI: Symmetric XII: midline tongue extension Motor:  Right : Upper extremity   5/5    Left:     Upper extremity   5/5  Lower extremity   5/5     Lower extremity   5/5 Tone and bulk:normal tone throughout. No asymmetry noted.  Sensory: Light touch intact throughout, bilaterally without extinction.  Deep Tendon Reflexes: 2+ and symmetric throughout Cerebellar: Finger tapping is rhythmic without evidence for ataxia.  Gait: Not assessed in context of acute stroke protocol.    Lab Results: Basic Metabolic Panel: No results for  input(s): NA, K, CL, CO2, GLUCOSE, BUN, CREATININE, CALCIUM, MG, PHOS in the last 168 hours.  Liver Function Tests: No results for input(s): AST, ALT, ALKPHOS, BILITOT, PROT, ALBUMIN in the last 168 hours. No results for input(s): LIPASE, AMYLASE in the last 168 hours. No results for input(s): AMMONIA in the last 168 hours.  CBC: No results for input(s): WBC, NEUTROABS, HGB, HCT, MCV, PLT in the last 168 hours.  Cardiac Enzymes: No results for input(s): CKTOTAL, CKMB, CKMBINDEX, TROPONINI in the last 168 hours.  Lipid Panel: No results for input(s): CHOL, TRIG, HDL, CHOLHDL, VLDL, LDLCALC in the last 168 hours.  CBG: No results for input(s): GLUCAP in the last 168 hours.  Microbiology: Results for orders placed or performed during the hospital encounter of 06/29/16  MRSA PCR Screening     Status: None   Collection Time: 06/29/16  6:27 PM  Result Value Ref Range Status   MRSA by PCR NEGATIVE NEGATIVE Final    Comment:        The GeneXpert MRSA Assay (FDA approved for NASAL specimens only), is one component of a comprehensive MRSA colonization surveillance program. It is not intended to diagnose MRSA infection nor to guide or monitor treatment for MRSA infections.     Coagulation Studies: No results for input(s): LABPROT, INR in the last 72 hours.  Imaging: No results found.  Assessment: 1. Acute onset of mild left sided weakness, now resolved. CT head negative for acute changes. CTA shows no LVO; right MCA aneurysm fills with contrast and is seen adjacent to the recently deployed pipeline flex flow diverter.  2. Status post endovascular treatment of wide neck right MCA aneurysm with pipeline flex flow diverter. 3.  Recurrent hypotension. Her transient left sided weakness could have been secondary to hypoperfusion within the right MCA/ACA watershed territory. Her weakness resolved during transport to CT, which correlated with improvement of her blood pressure.  Plan: 1.  Maintain BP with a SBP goal of 110-130. Fluid boluses and IVNS infusion are recommended.  2. Continue IV heparin.  3. Frequent neuro checks.   06/30/2016, 1:52 AM

## 2016-06-30 NOTE — Progress Notes (Signed)
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 Authorizing Provider  acetaminophen (TYLENOL) 650 MG CR tablet Take 1,300 mg by mouth every 8 (eight) hours as needed for pain.   Yes Historical Provider, MD  acidophilus (RISAQUAD) CAPS capsule Take 1 capsule by mouth daily.   Yes Historical Provider, MD  ALPRAZolam [name]) 0.5 MG tablet Take 0.5 mg by mouth 3 (three) times daily.    Yes Historical Provider, MD  amLODipine (NORVASC) 10 MG tablet Take 1 tablet (10 mg total) by mouth daily. 05/01/16 07/30/16 Yes Pricilla Riffle, MD  aspirin EC 81 MG tablet Take 81 mg by mouth daily.   Yes Historical Provider, MD  butalbital-acetaminophen-caffeine (FIORICET, ESGIC) 50-325-40 MG per tablet Take 1-2 tablets by mouth every 4 (four) hours as needed for headache or migraine.  07/06/14  Yes Historical Provider, MD  cholecalciferol (VITAMIN D) 1000 units tablet Take 1,000 Units by mouth daily.   Yes Historical Provider, MD  clopidogrel (PLAVIX) 75 MG tablet Take 75 mg by mouth daily. 06/18/16  Yes Historical Provider, MD  fexofenadine-pseudoephedrine (ALLEGRA-D) 60-120 MG 12 hr tablet Take 1 tablet by mouth daily as needed (allergies).    Yes Historical Provider, MD  flecainide (TAMBOCOR) 100 MG tablet Take 1 tablet (100 mg total) by mouth 2 (two) times daily. 03/31/16  Yes  Scott T Alben Spittle, PA-C  FLUoxetine (PROZAC) 20 MG capsule Take 20 mg by mouth 2 (two) times daily. 06/07/16  Yes Historical Provider, MD  fluticasone (FLONASE) 50 MCG/ACT nasal spray Place 1 spray into both nostrils daily as needed for allergies or rhinitis.  10/11/14  Yes Historical Provider, MD  lactose free nutrition (BOOST) LIQD Take 237 mLs by mouth daily.   Yes Historical Provider, MD  lisinopril (PRINIVIL,ZESTRIL) 10 MG tablet Take 1 tablet (10 mg total) by mouth daily. Patient taking differently: Take 10 mg by mouth 2 (two) times daily.  03/31/16  Yes Scott Moishe Spice, PA-C  meclizine (ANTIVERT) 12.5 MG tablet Take 12.5 mg by mouth 3 (three) times daily as needed for dizziness.   Yes Historical Provider, MD  metoprolol succinate (TOPROL-XL) 25 MG 24 hr tablet TAKE 1 TABLET BY MOUTH EVERY NIGHT AT BEDTIME 04/01/16  Yes Scott T Weaver, PA-C  omeprazole (PRILOSEC) 40 MG capsule Take 40 mg by mouth daily.     Yes Historical Provider, MD     Vital Signs: BP (!) 132/53   Pulse 69   Temp 98.2 F (36.8 C) (Oral)   Resp (!) 21   Ht 5\' 3"  (1.6 m)   Wt 132 lb 7.9 oz (60.1 kg)   SpO2 97%   BMI 23.47 kg/m   Physical Exam  Constitutional: She is oriented to person, place, and time.  HENT:  Tongue midline Face symmetrical Smile =  Eyes: EOM are normal.  Abdominal: Soft. Bowel sounds are normal.  Musculoskeletal: Normal range of motion. She exhibits tenderness.  Rt groin tender to palpate No hematoma appreciated  No bleeding Rt foot 2+ pulses  Neurological: She is alert and oriented to person, place, and time.  Skin: Skin is warm and dry.  Psychiatric: She has a normal mood and affect. Her behavior is normal. Judgment and thought content normal.  Nursing note and vitals reviewed.   Imaging: Ct Angio Head W Or Wo Contrast  Result Date: 06/30/2016 CLINICAL DATA:  Status post endovascular stent placement for exclusion of right MCA aneurysm. Left-sided deficits. EXAM: CT ANGIOGRAPHY HEAD AND  NECK TECHNIQUE: Multidetector CT imaging of the head and neck was performed using the standard protocol during bolus administration of intravenous contrast. Multiplanar CT image reconstructions and MIPs were obtained to evaluate the vascular anatomy. Carotid stenosis measurements (when applicable) are obtained utilizing NASCET criteria, using the distal internal carotid diameter as the denominator. CONTRAST:  50 mL Isovue 350 COMPARISON:  None. FINDINGS: CTA NECK FINDINGS Aortic arch: Normal three-vessel branch pattern. No dissection or aneurysm. There is atherosclerotic plaque narrowing both proximal subclavian arteries, resulting N approximately 60% stenosis on the right. Right carotid system: No evidence of dissection, stenosis (50% or greater) or occlusion. Left carotid system: No evidence of dissection, stenosis (50% or greater) or occlusion. Vertebral arteries: Both vertebral artery origins are widely patent. The vertebral system is left dominant. Both vertebral arteries are normal to their confluence with the basilar artery. Skeleton: No bony spinal canal stenosis. Multilevel facet hypertrophy. Other neck: Negative Upper chest: Mild biapical emphysema. Review of the MIP images confirms the above findings CTA HEAD FINDINGS Anterior circulation: --Intracranial internal carotid arteries: Mild atherosclerotic calcification of the internal carotid arteries at the skullbase. Otherwise normal. --Middle cerebral arteries: There is a flow-diverting endovascular stent device within the right MCA, at the site of the bifurcation aneurysm. There is persistent opacification of the aneurysm sac, which measures 7 x 5 mm. It is difficult to assess the lumen of the stent, however, there is patency of the vessels immediately proximal and distal to the stent. The left MCA is normal. --Anterior cerebral arteries: Normal. --Posterior communicating arteries: A small P-comm is present on the right. None is seen on the left.  Posterior circulation: --Posterior cerebral arteries: Normal. --Superior cerebellar arteries: Normal. --Basilar artery: Normal. --Anterior inferior cerebellar arteries: Normal. --Posterior inferior cerebellar arteries: Normal. Venous sinuses: As permitted by contrast timing, patent. Anatomic variants: None Delayed phase: No abnormal intracranial enhancement. Review of the MIP images confirms the above findings IMPRESSION: 1. No intracranial arterial occlusion. 2. Flow diverting stent within the distal M1 segment right MCA. Persistent opacification of the MCA bifurcation aneurysm sac, which is not unexpected at this time. 3. Normal cervical carotid and vertebral arteries. 4. Moderate right subclavian artery stenosis, distal to the right vertebral artery origin, measuring approximately 60%. These results were called by telephone at the time of interpretation on 06/30/2016 at 3:19 am to Dr. Caryl PinaERIC LINDZEN , who verbally acknowledged these results. Electronically Signed   By: Deatra RobinsonKevin  Herman M.D.   On: 06/30/2016 03:29   Ct Angio Neck W Or Wo Contrast  Result Date: 06/30/2016 CLINICAL DATA:  Status post endovascular stent placement for exclusion of right MCA aneurysm. Left-sided deficits. EXAM: CT ANGIOGRAPHY HEAD AND NECK TECHNIQUE: Multidetector CT imaging of the head and neck was performed using the standard protocol during bolus administration of intravenous contrast. Multiplanar CT image reconstructions and MIPs were obtained to evaluate the vascular anatomy. Carotid stenosis measurements (when applicable) are obtained utilizing NASCET criteria, using the distal internal carotid diameter as the denominator. CONTRAST:  50 mL Isovue 350 COMPARISON:  None. FINDINGS: CTA NECK FINDINGS Aortic arch: Normal three-vessel branch pattern. No dissection or aneurysm. There is atherosclerotic plaque narrowing both proximal subclavian arteries, resulting N approximately 60% stenosis on the right. Right carotid system: No evidence  of dissection, stenosis (50% or greater) or occlusion. Left carotid system: No evidence of dissection, stenosis (50% or greater) or occlusion. Vertebral arteries: Both vertebral artery origins are widely patent. The vertebral system is left dominant. Both vertebral arteries are normal to their confluence with the basilar artery. Skeleton: No bony spinal canal stenosis. Multilevel facet hypertrophy. Other neck: Negative Upper chest: Mild biapical emphysema. Review of the MIP images confirms the above findings CTA HEAD FINDINGS Anterior circulation: --Intracranial internal carotid arteries: Mild atherosclerotic calcification of the internal carotid arteries at the skullbase. Otherwise normal. --Middle cerebral arteries: There is a flow-diverting endovascular stent device within the right MCA, at the site of the bifurcation aneurysm. There is persistent opacification of the aneurysm sac, which measures 7 x 5 mm. It is difficult to assess the lumen of the stent, however, there is patency of the vessels immediately proximal and distal to the stent. The left MCA is normal. --Anterior cerebral arteries: Normal. --Posterior communicating arteries: A small P-comm is present on the right. None is seen on the left. Posterior circulation: --Posterior cerebral arteries: Normal. --Superior cerebellar arteries: Normal. --Basilar artery: Normal. --Anterior inferior cerebellar arteries: Normal. --Posterior inferior cerebellar arteries: Normal. Venous sinuses: As permitted by contrast timing, patent. Anatomic variants: None Delayed phase: No abnormal intracranial enhancement. Review of the MIP images confirms the above findings IMPRESSION: 1. No intracranial arterial occlusion. 2. Flow diverting stent within the distal M1 segment right MCA. Persistent opacification of the MCA bifurcation aneurysm sac, which is not unexpected at this time. 3. Normal cervical carotid and vertebral arteries. 4. Moderate right subclavian artery stenosis,  distal to the right vertebral artery origin, measuring approximately 60%. These results were called by telephone at the time of interpretation on 06/30/2016 at 3:19 am to Dr. Caryl PinaERIC LINDZEN , who verbally acknowledged these results. Electronically Signed   By: Deatra RobinsonKevin  Herman M.D.   On: 06/30/2016 03:29   Ct Head Code Stroke Wo Contrast`  Result Date: 06/30/2016 CLINICAL DATA:  Code stroke. Left-sided weakness. Status post placement of right MCA stent. EXAM: CT HEAD WITHOUT CONTRAST TECHNIQUE: Contiguous axial images were obtained from the base of the skull through the vertex without intravenous contrast. COMPARISON:  Head CT 04/10/2016 and cerebral angiogram 06/29/2016 FINDINGS: Brain: No mass lesion, intraparenchymal hemorrhage or extra-axial collection. No evidence of acute cortical infarct. Brain parenchyma and CSF-containing spaces are normal for age. Vascular: There is a right MCA stent present. Skull: Normal visualized skull base, calvarium and extracranial soft tissues. Sinuses/Orbits: No sinus fluid levels or advanced mucosal thickening. No mastoid effusion. Normal orbits. ASPECTS [facility]([place] Stroke Program Early CT Score) - Ganglionic level infarction (caudate, lentiform nuclei, internal capsule, insula, M1-M3 cortex): Set - Supraganglionic infarction (M4-M6 cortex): 3 Total score (0-10 with 10 being normal): 10 IMPRESSION: 1. No acute intracranial abnormality. Status post placement of right MCA stent. 2. ASPECTS is 10. These results were called by telephone at the time of interpretation on 06/30/2016 at 2:25 am to Dr. Caryl PinaERIC LINDZEN , who verbally acknowledged these results. Electronically Signed   By: Deatra RobinsonKevin  Herman M.D.   On: 06/30/2016 02:25    Labs:  CBC:  Recent Labs  04/11/16 0221 06/22/16 0949 06/30/16 0230 06/30/16 0237 06/30/16 0320  WBC 6.5 8.0  9.6  --  9.5  HGB 9.7* 12.7 6.5* 6.1* 6.6*  HCT 30.4* 39.0 19.7* 18.0* 19.7*  PLT 311 332 205  --  202    COAGS:  Recent Labs   06/22/16 0949  INR 1.10  APTT 31    BMP:  Recent Labs  04/10/16 1030 04/11/16 0221 06/22/16 0949 06/30/16 0230 06/30/16 0237  NA 136 139 137 139 141  K 3.4* 2.8* 3.5 3.9 4.0  CL 102 107 102 113* 111  CO2 29 26 26  21*  --   GLUCOSE 98 91 85 103* 106*  BUN 7 <5* 9 8 8   CALCIUM 9.3 7.8* 9.8 7.5*  --   CREATININE 1.02* 0.73 0.93 0.75 0.70  GFRNONAA 53* >60 59* >60  --   GFRAA >60 >60 >60 >60  --     LIVER FUNCTION TESTS:  Recent Labs  04/10/16 1030 06/22/16 0949  BILITOT 0.4 0.5  AST 15 21  ALT 7* 12*  ALKPHOS 83 67  PROT 7.2 7.3  ALBUMIN 3.4* 3.9    Assessment and Plan:  R MCA aneurysm flow diverter stent placed 11/6 in IR Rt groin tenderness this am---US doppler ordered  Hg 6.6---1 unit tx RBCs Check P2y12 Check cbc We will recheck later today    Electronically Signed: Johnie Makki A 06/30/2016, 9:09 AM   I spent a total of 15 Minutes at the the patient's bedside AND on the patient's [facility] floor or unit, greater than 50% of which was counseling/coordinating care for R MCA embolization

## 2016-07-01 ENCOUNTER — Inpatient Hospital Stay (HOSPITAL_COMMUNITY): Payer: Medicare HMO

## 2016-07-01 DIAGNOSIS — I6340 Cerebral infarction due to embolism of unspecified cerebral artery: Secondary | ICD-10-CM | POA: Insufficient documentation

## 2016-07-01 DIAGNOSIS — I63411 Cerebral infarction due to embolism of right middle cerebral artery: Principal | ICD-10-CM

## 2016-07-01 LAB — TYPE AND SCREEN
ABO/RH(D): AB POS
ANTIBODY SCREEN: NEGATIVE
Unit division: 0

## 2016-07-01 NOTE — Progress Notes (Signed)
Patient ID: Barbara Hill, female   DOB: 09/15/1940, 75 y.o.   MRN: [id number] INR . Clinically stable. Devoid of any complaints. Eating  And ambulating. C/O RT groin soreness distal pulses intact. Plan. D/C to home with instructions to stop smoking,and maintain hydration. Avoid stooping,bending or lifting more than 10 lbs for 2 weeks. Patient expressed understaing. Fatima SangerS. Fairy Ashlock MD

## 2016-07-01 NOTE — Progress Notes (Signed)
STROKE TEAM PROGRESS NOTE   SUBJECTIVE (INTERVAL HISTORY) Pt RN is at bedside. She is doing well. Still has left shoulder sore, likely due to the fall. Her MRI showed right MCA several punctate infarcts, likely due to procedure related. Plan to d/c home today.     OBJECTIVE Temp:  [97.8 F (36.6 C)-100.2 F (37.9 C)] 98.3 F (36.8 C) (11/08 0800) Pulse Rate:  [62-73] 70 (11/08 1000) Cardiac Rhythm: Normal sinus rhythm (11/08 0800) Resp:  [13-18] 18 (11/08 1000) BP: (103-163)/(44-92) 132/55 (11/08 1000) SpO2:  [93 %-100 %] 98 % (11/08 1000)  CBC:  Recent Labs Lab 06/30/16 0230  06/30/16 0320 06/30/16 0955  WBC 9.6  --  9.5  --   NEUTROABS 6.3  --   --   --   HGB 6.5*  < > 6.6* 8.2*  HCT 19.7*  < > 19.7* 24.6*  MCV 90.4  --  90.8  --   PLT 205  --  202  --   < > = values in this interval not displayed.  Basic Metabolic Panel:   Recent Labs Lab 06/30/16 0230 06/30/16 0237  NA 139 141  K 3.9 4.0  CL 113* 111  CO2 21*  --   GLUCOSE 103* 106*  BUN 8 8  CREATININE 0.75 0.70  CALCIUM 7.5*  --    Lipid Panel     Component Value Date/Time   CHOL 97 06/30/2016 0230   TRIG 97 06/30/2016 0230   HDL 33 (L) 06/30/2016 0230   CHOLHDL 2.9 06/30/2016 0230   VLDL 19 06/30/2016 0230   LDLCALC 45 06/30/2016 0230     IMAGING I have personally reviewed the radiological images below and agree with the radiology interpretations.  Ct Angio Head W Or Wo Contrast Ct Angio Neck W Or Wo Contrast 06/30/2016 1. No intracranial arterial occlusion. 2. Flow diverting stent within the distal M1 segment right MCA. Persistent opacification of the MCA bifurcation aneurysm sac, which is not unexpected at this time. 3. Normal cervical carotid and vertebral arteries. 4. Moderate right subclavian artery stenosis, distal to the right vertebral artery origin, measuring approximately 60%.   Ct Head Code Stroke Wo Contrast` 06/30/2016  1. No acute intracranial abnormality. Status post placement of  right MCA stent. 2. ASPECTS is 10.   Mr Brain Wo Contrast 07/01/2016 IMPRESSION: 1. Multiple (approximately 10) punctate foci of acute ischemia scattered within the right MCA distribution. 2. No acute hemorrhage or mass effect. 3. Flow diverting endovascular stent within the right MCA, at the site of treated aneurysm.   TTE 04/11/16 - Left ventricle: The cavity size was normal. Wall thickness was   increased in a pattern of mild LVH. Systolic function was normal.   The estimated ejection fraction was in the range of 60% to 65%.   Wall motion was normal; there were no regional wall motion   abnormalities. Features are consistent with a pseudonormal left   ventricular filling pattern, with concomitant abnormal relaxation   and increased filling pressure (grade 2 diastolic dysfunction). - Aortic valve: There was mild regurgitation. - Mitral valve: There was mild regurgitation. - Left atrium: The atrium was mildly dilated. - Right atrium: Central venous pressure (est): 3 mm Hg. - Tricuspid valve: There was trivial regurgitation. - Pulmonary arteries: Systolic pressure could not be accurately   estimated. - Pericardium, extracardiac: There was no pericardial effusion. Impressions: - Mild LVH with LVEF 60-65%. Grade 2 diastolic dysfunction with   increased LV filling pressure. Mild  mitral regurgitation. Left   atrial enlargement. Mild aortic regurgitation. Trivial tricuspid   regurgitation.   PHYSICAL EXAM  Temp:  [97.8 F (36.6 C)-100.2 F (37.9 C)] 98.3 F (36.8 C) (11/08 0800) Pulse Rate:  [62-73] 70 (11/08 1000) Resp:  [13-18] 18 (11/08 1000) BP: (103-163)/(44-92) 132/55 (11/08 1000) SpO2:  [93 %-100 %] 98 % (11/08 1000)  General - Well nourished, well developed, in no apparent distress.  Ophthalmologic - Fundi not visualized due to noncooperation.  Cardiovascular - Regular rate and rhythm.  Mental Status -  Level of arousal and orientation to time, place, and person were  intact. Language including expression, naming, repetition, comprehension was assessed and found intact. Fund of Knowledge was assessed and was intact.  Cranial Nerves II - XII - II - Visual field intact OU. III, IV, VI - Extraocular movements intact. V - Facial sensation intact bilaterally. VII - Facial movement intact bilaterally. VIII - Hearing & vestibular intact bilaterally. X - Palate elevates symmetrically. XI - Chin turning & shoulder shrug intact bilaterally. XII - Tongue protrusion intact.  Motor Strength - The patient's strength was normal in all extremities except left UE proximal 4-/5 due to left shoulder pain as per pt and pronator drift was present on the left.  Bulk was normal and fasciculations were absent.   Motor Tone - Muscle tone was assessed at the neck and appendages and was normal.  Reflexes - The patient's reflexes were 1+ in all extremities and she had no pathological reflexes.  Sensory - Light touch, temperature/pinprick were assessed and were symmetrical.    Coordination - The patient had normal movements in the hands with no ataxia or dysmetria.  Tremor was absent.  Gait and Station - deferred.   ASSESSMENT/PLAN Barbara Hill is a 75 y.o. 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 Post op, developed transient L sided weakness in setting of hypotension.   Stroke: right MCA punctate infarcts s/p R MCA pipeline stent placement, likely related to procedure. However, hypotension due to blood loss is also possible.  Resultant - left arm weakness due to left shoulder pain after fall  CTA head & neck - No LVO, flow diverting stent in R M1 MCA, right MCA aneurysm  MRI  Right MCA scattered punctate infarcts likely due to procedure  TTE EF 60-65% in 03/2016 for fall work up  LDL 45  SCDs for VTE prophylaxis Diet Heart Room service appropriate? Yes; Fluid consistency: Thin  aspirin 81 mg daily and  clopidogrel 75 mg daily prior to admission, treated with IV heparin post op, now on aspirin 325 mg daily and clopidogrel 75 mg daily. Continue DAPT on discharge.  Patient counseled to be compliant with her antithrombotic medications  Ongoing aggressive stroke risk factor management  Disposition:  Likely home today  Hypotension Hx orthostatic hypotension  As low as 70/45  Improved yesterday and today, BP in am 132/55  Need close follow up with PCP  Acute blood loss anemia may contribute to hypotension  Acute blood loss anemia  Hgb 12.7->6.6->8.2  Related to procedure  S/p PRBC  Tobacco abuse  Current smoker  Smoking cessation counseling provided  Pt is willing to quit  Other Stroke Risk Factors  Advanced age  Hx of SVT  Other Active Problems    Hospital day # 2  Neurology will sign off. Please call with questions. No neuro follow up needed. Thanks for the consult.   Marvel PlanJindong Jaqua Ching, MD  PhD Stroke Neurology 07/01/2016 10:34 AM    To contact Stroke Continuity provider, please refer to [email or web address]. After hours, contact General Neurology

## 2016-07-01 NOTE — Progress Notes (Signed)
Patient walked around the unit twice with standby assist. She is voiding and doing well. Patient was educated on smoking cessation. Will continue to monitor. Barbara Hill, [place] ScrapeSarah E, RN

## 2016-07-01 NOTE — Progress Notes (Signed)
Discharge paperwork discussed with patient and her fiance. Belongings packed up IV's removed. Pt voided before leaving. Waiting for wheelchair to escort pt out. Meryle Pugmire, [place] ScrapeSarah E, RN

## 2016-07-01 NOTE — Discharge Summary (Signed)
Patient ID: Barbara Hill MRN: [id number] DOB/AGE: 04-06-1941 75 y.o.  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 Work up of headache and back/hip pain included MRI Brain. 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 Referred to Dr [name] and procedure of embolization with pipeline flow diverter stent was scheduled for 06/29/16. Procedure was performed without complication. 11/6 pm; Developed minimal Left sided weakness; hypotension. Code Stroke called. Evaluated by Neurology and CTA performed and No New Stroke identified. 11/7 MRI: Ischemia noted; no hemorrhage and stent present. Today pt doing quite well. Eating well; slept well. Denies headache; denies speech or vision issues. Has urinated; +BM Dr [name] has seen and evaluated pt.   Consults: neurology                    Dr Marvel Plan  Significant Diagnostic Studies: Cerebral arteriogram  Treatments:  CEREBRAL ANGIOGRAM [[id number] (Custom)]       S/p RT Vert artery angiogram and RT Common carotid arteriogram,follwed by  endovascular treateatment of wide neck RT MCA aneurysm with pipeline flex flow diverter.       Discharge Exam: Blood pressure (!) 132/55, pulse 70, temperature 98.3 F (36.8 C), temperature source Oral, resp. rate 18, height 5\' 3"  (1.6 m), weight 132 lb 7.9 oz (60.1 kg), SpO2 98 %.  PE: A/O Pleasant Face symmetrical Smile = Tongue midline Puffs cheeks = Heart RRR Lungs: CTA Abd: soft, +BS, NT Extr: FROM; ambulating in hall Rt groin: tender at site; no hematoma ++ ecchymosis from site to inner knee Rt groin  doppler 11/7: Summary: There is no evidence of pseudoaneurysm involving the right common femoral artery, and superficial femoral artery. There is evidence of low velocity unidirectional collateral flow of unknown etiology in the right femoral artery in the area of the groin. Rt foot 2+ pulses  Results for orders placed or performed during the hospital encounter of 06/29/16  MRSA PCR Screening  Result Value Ref Range   MRSA by PCR NEGATIVE NEGATIVE  Platelet inhibition p2y12 (not at [facility])  Result Value Ref Range   Platelet Function  P2Y12 127 (L) 194 - 418 PRU  Heparin level (unfractionated)  Result Value Ref Range   Heparin Unfractionated 0.24 (L) 0.30 - 0.70 IU/mL  Basic metabolic panel  Result Value Ref Range   Sodium 139 135 - 145 mmol/L   Potassium 3.9 3.5 - 5.1 mmol/L   Chloride 113 (H) 101 - 111 mmol/L   CO2 21 (L) 22 - 32 mmol/L   Glucose, Bld 103 (H) 65 - 99 mg/dL   BUN 8 6 - 20 mg/dL   Creatinine, Ser 1.30 0.44 - 1.00 mg/dL   Calcium 7.5 (L) 8.9 - 10.3 mg/dL   GFR calc non Af Amer >60 >60 mL/min   GFR calc Af Amer >60 >60 mL/min   Anion gap 5 5 - 15  CBC WITH DIFFERENTIAL  Result Value Ref Range   WBC 9.6 4.0 - 10.5 K/uL   RBC 2.18 (L) 3.87 - 5.11 MIL/uL   Hemoglobin 6.5 (LL) 12.0 - 15.0 g/dL   HCT 86.5 (L) 78.4 - 69.6 %   MCV 90.4 78.0 - 100.0 fL  MCH 29.8 26.0 - 34.0 pg   MCHC 33.0 30.0 - 36.0 g/dL   RDW 91.413.0 78.211.5 - 95.615.5 %   Platelets 205 150 - 400 K/uL   Neutrophils Relative % 66 %   Neutro Abs 6.3 1.7 - 7.7 K/uL   Lymphocytes Relative 27 %   Lymphs Abs 2.6 0.7 - 4.0 K/uL   Monocytes Relative 5 %   Monocytes Absolute 0.5 0.1 - 1.0 K/uL   Eosinophils Relative 1 %   Eosinophils Absolute 0.1 0.0 - 0.7 K/uL   Basophils Relative 0 %   Basophils Absolute 0.0 0.0 - 0.1 K/uL  CBC  Result Value Ref Range   WBC 9.5 4.0 - 10.5 K/uL   RBC 2.17 (L) 3.87 - 5.11 MIL/uL   Hemoglobin 6.6 (LL) 12.0 - 15.0 g/dL   HCT 21.319.7 (L) 08.636.0 - 57.846.0 %   MCV 90.8 78.0 - 100.0  fL   MCH 30.4 26.0 - 34.0 pg   MCHC 33.5 30.0 - 36.0 g/dL   RDW 46.913.0 62.911.5 - 52.815.5 %   Platelets 202 150 - 400 K/uL  Lipid panel  Result Value Ref Range   Cholesterol 97 0 - 200 mg/dL   Triglycerides 97 <413<150 mg/dL   HDL 33 (L) >24>40 mg/dL   Total CHOL/HDL Ratio 2.9 RATIO   VLDL 19 0 - 40 mg/dL   LDL Cholesterol 45 0 - 99 mg/dL  Platelet inhibition M0N02p2y12 (Not at [facility])  Result Value Ref Range   Platelet Function  P2Y12 146 (L) 194 - 418 PRU  Hemoglobin and hematocrit, blood  Result Value Ref Range   Hemoglobin 8.2 (L) 12.0 - 15.0 g/dL   HCT 72.524.6 (L) 36.636.0 - 44.046.0 %  Glucose, capillary  Result Value Ref Range   Glucose-Capillary 82 65 - 99 mg/dL   Comment 1 Notify RN    Comment 2 Document in Chart   Glucose, capillary  Result Value Ref Range   Glucose-Capillary 94 65 - 99 mg/dL  I-STAT, chem 8  Result Value Ref Range   Sodium 141 135 - 145 mmol/L   Potassium 4.0 3.5 - 5.1 mmol/L   Chloride 111 101 - 111 mmol/L   BUN 8 6 - 20 mg/dL   Creatinine, Ser 3.470.70 0.44 - 1.00 mg/dL   Glucose, Bld 425106 (H) 65 - 99 mg/dL   Calcium, Ion 9.561.13 (L) 1.15 - 1.40 mmol/L   TCO2 22 0 - 100 mmol/L   Hemoglobin 6.1 (LL) 12.0 - 15.0 g/dL   HCT 38.718.0 (L) 56.436.0 - 33.246.0 %   Comment NOTIFIED PHYSICIAN   [facility] and screen [facility]  Result Value Ref Range   ABO/RH(D) AB POS    Antibody Screen NEG    Sample Expiration 07/03/2016    Unit Number [id number]    Blood Component Type RED CELLS,LR    Unit division 00    Status of Unit ISSUED,FINAL    Transfusion Status OK TO TRANSFUSE    Crossmatch Result Compatible   Prepare RBC  Result Value Ref Range   Order Confirmation ORDER PROCESSED BY BLOOD BANK   ABO/Rh  Result Value Ref Range   ABO/RH(D) AB POS      Disposition: R Middle cerebral artery embolization 11/6 in IR with Dr Corliss Skainseveshwar Ready for discharge to home today Dr Corliss Skainseveshwar  2 week follow up appt---will hear from scheduler for appt time and date Continue all home meds ASA  81 mg daily; Plavix 75 mg daily She has good  understanding of discharge instructions    Discharge Instructions    Call MD for:  difficulty breathing, headache or visual disturbances    Complete by:  As directed    Call MD for:  extreme fatigue    Complete by:  As directed    Call MD for:  hives    Complete by:  As directed    Call MD for:  persistant dizziness or light-headedness    Complete by:  As directed    Call MD for:  persistant nausea and vomiting    Complete by:  As directed    Call MD for:  redness, tenderness, or signs of infection (pain, swelling, redness, odor or green/yellow discharge around incision site)    Complete by:  As directed    Call MD for:  severe uncontrolled pain    Complete by:  As directed    Call MD for:  temperature >100.4    Complete by:  As directed    Diet - low sodium heart healthy    Complete by:  As directed    Discharge instructions    Complete by:  As directed    2 week follow up with Dr Corliss Skainseveshwar - Scheduler will call pt with time and date; call [phone] if questions or concerns; continue all meds---ASA and Plavix daily   Discharge wound care:    Complete by:  As directed    May shower today; keep clean band aid on Rt groin site daily for 1 week   Driving Restrictions    Complete by:  As directed    No driving x 2 weeks   Increase activity slowly    Complete by:  As directed    Lifting restrictions    Complete by:  As directed    No lifting over 10 lbs x 2 weeks   Other Restrictions    Complete by:  As directed    STOP SMOKING       Medication List    TAKE these medications   acetaminophen 650 MG CR tablet Commonly known as:  TYLENOL Take 1,300 mg by mouth every 8 (eight) hours as needed for pain.   acidophilus Caps capsule Take 1 capsule by mouth daily.   ALPRAZolam 0.5 MG tablet Commonly known as:  XANAX Take 0.5 mg by mouth 3 (three) times daily.   amLODipine 10 MG tablet Commonly known as:  NORVASC Take 1  tablet (10 mg total) by mouth daily.   aspirin EC 81 MG tablet Take 81 mg by mouth daily.   butalbital-acetaminophen-caffeine 50-325-40 MG tablet Commonly known as:  FIORICET, ESGIC Take 1-2 tablets by mouth every 4 (four) hours as needed for headache or migraine.   cholecalciferol 1000 units tablet Commonly known as:  VITAMIN D Take 1,000 Units by mouth daily.   clopidogrel 75 MG tablet Commonly known as:  PLAVIX Take 75 mg by mouth daily.   fexofenadine-pseudoephedrine 60-120 MG 12 hr tablet Commonly known as:  ALLEGRA-D Take 1 tablet by mouth daily as needed (allergies).   flecainide 100 MG tablet Commonly known as:  TAMBOCOR Take 1 tablet (100 mg total) by mouth 2 (two) times daily.   FLUoxetine 20 MG capsule Commonly known as:  PROZAC Take 20 mg by mouth 2 (two) times daily.   fluticasone 50 MCG/ACT nasal spray Commonly known as:  FLONASE Place 1 spray into both nostrils daily as needed for allergies or rhinitis.   lactose free nutrition Liqd Take 237 mLs by mouth  daily.   lisinopril 10 MG tablet Commonly known as:  PRINIVIL,ZESTRIL Take 1 tablet (10 mg total) by mouth daily. What changed:  when to take this   meclizine 12.5 MG tablet Commonly known as:  ANTIVERT Take 12.5 mg by mouth 3 (three) times daily as needed for dizziness.   metoprolol succinate 25 MG 24 hr tablet Commonly known as:  TOPROL-XL TAKE 1 TABLET BY MOUTH EVERY NIGHT AT BEDTIME   omeprazole 40 MG capsule Commonly known as:  PRILOSEC Take 40 mg by mouth daily.      Follow-up Information    DEVESHWAR, Grandville Silos, MD Follow up.   Specialty:  Interventional Radiology Why:  scheduler will call pt with time and date of 2 week appt; call [phone] if questions or concerns Contact information: [address] [place] [address] [phone]            Electronically Signed: Ralene Muskrat A 07/01/2016, 10:40 AM   I have spent Greater Than 30 Minutes discharging  [name].

## 2016-07-02 ENCOUNTER — Telehealth (HOSPITAL_COMMUNITY): Payer: Self-pay | Admitting: Radiology

## 2016-07-02 NOTE — Telephone Encounter (Signed)
Returned pt's call, left VM for her to call back. JM

## 2016-07-03 ENCOUNTER — Encounter (HOSPITAL_COMMUNITY): Payer: Self-pay | Admitting: Interventional Radiology

## 2016-07-15 ENCOUNTER — Encounter: Payer: Self-pay | Admitting: *Deleted

## 2016-07-23 NOTE — Progress Notes (Signed)
Cardiology Office Note   Date:  07/30/2016   ID:  Barbara HazelBrenda M Devoto, DOB 01/07/1941, MRN [id number]  PCP:  Lilia ArgueKAPLAN,KRISTEN, PA-C  Cardiologist:   Dietrich PatesPaula Vineet Kinney, MD   F/U of dizziness and syncope   History of Present Illness: Barbara Hill is a 75 y.o. female with a history of SVT, palpitations, dizziness with orthostatic intolerance COPD, HTN  Syncope admission in summer 2017  (dehydrated).    She saw V Bhagat in AUg 2017  Since seen she continues to be dizzy at times and near syncope  Says she is trying to drink fluids   She has had loose bowels today   Outpatient Medications Prior to Visit  Medication Sig Dispense Refill  . acetaminophen (TYLENOL) 650 MG CR tablet Take 1,300 mg by mouth every 8 (eight) hours as needed for pain.    Marland Kitchen. acidophilus (RISAQUAD) CAPS capsule Take 1 capsule by mouth daily.    Marland Kitchen. ALPRAZolam (XANAX) 0.5 MG tablet Take 0.5 mg by mouth 3 (three) times daily.     Marland Kitchen. amLODipine (NORVASC) 10 MG tablet Take 1 tablet (10 mg total) by mouth daily. 30 tablet 6  . aspirin EC 81 MG tablet Take 81 mg by mouth daily.    . butalbital-acetaminophen-caffeine (FIORICET, ESGIC) 50-325-40 MG per tablet Take 1-2 tablets by mouth every 4 (four) hours as needed for headache or migraine.   3  . cholecalciferol (VITAMIN D) 1000 units tablet Take 1,000 Units by mouth daily.    . clopidogrel (PLAVIX) 75 MG tablet Take 75 mg by mouth daily.  3  . fexofenadine-pseudoephedrine (ALLEGRA-D) 60-120 MG 12 hr tablet Take 1 tablet by mouth daily as needed (allergies).     . flecainide (TAMBOCOR) 100 MG tablet Take 1 tablet (100 mg total) by mouth 2 (two) times daily. 60 tablet 11  . FLUoxetine (PROZAC) 20 MG capsule Take 20 mg by mouth 2 (two) times daily.  11  . fluticasone (FLONASE) 50 MCG/ACT nasal spray Place 1 spray into both nostrils daily as needed for allergies or rhinitis.   11  . lactose free nutrition (BOOST) LIQD Take 237 mLs by mouth daily.    Marland Kitchen. lisinopril (PRINIVIL,ZESTRIL) 10 MG  tablet Take 1 tablet (10 mg total) by mouth daily. (Patient taking differently: Take 10 mg by mouth 2 (two) times daily. ) 30 tablet 11  . meclizine (ANTIVERT) 12.5 MG tablet Take 12.5 mg by mouth 3 (three) times daily as needed for dizziness.    . metoprolol succinate (TOPROL-XL) 25 MG 24 hr tablet TAKE 1 TABLET BY MOUTH EVERY NIGHT AT BEDTIME 90 tablet 3  . omeprazole (PRILOSEC) 40 MG capsule Take 40 mg by mouth daily.       No facility-administered medications prior to visit.      Allergies:   Dobutamine; Darvocet [propoxyphene n-acetaminophen]; Excedrin extra strength [aspirin-acetaminophen-caffeine]; Clarithromycin; Doxycycline; Penicillins; Prednisone; and Propoxyphene   Past Medical History:  Diagnosis Date  . Anxiety   . Arrhythmia    palps  . COPD (chronic obstructive pulmonary disease) (HCC)   . Dizziness   . Fatigue   . H/O: hysterectomy   . Headache(784.0)   . Hypertension   . Mitral valve prolapse   . Radiculopathy   . SVT (supraventricular tachycardia) (HCC)   . UTI (lower urinary tract infection)     Past Surgical History:  Procedure Laterality Date  . ABDOMINAL HYSTERECTOMY    . APPENDECTOMY    . CARDIAC CATHETERIZATION  12/95  . CHOLECYSTECTOMY    .  IR GENERIC HISTORICAL  06/02/2016   IR RADIOLOGIST EVAL & MGMT 06/02/2016 [facility]  . IR GENERIC HISTORICAL  06/29/2016   IR ANGIO INTRA EXTRACRAN SEL INTERNAL CAROTID UNI R MOD SED 06/29/2016 Julieanne CottonSanjeev Deveshwar, MD [facility]  . IR GENERIC HISTORICAL  06/29/2016   IR ANGIOGRAM FOLLOW UP STUDY 06/29/2016 Julieanne CottonSanjeev Deveshwar, MD [facility]  . IR GENERIC HISTORICAL  06/29/2016   IR NEURO EACH ADD'L AFTER BASIC UNI RIGHT (MS) 06/29/2016 Julieanne CottonSanjeev Deveshwar, MD [facility]  . IR GENERIC HISTORICAL  06/29/2016   IR 3D INDEPENDENT WKST 06/29/2016 Julieanne CottonSanjeev Deveshwar, MD [facility]  . IR GENERIC HISTORICAL  06/29/2016   IR TRANSCATH/EMBOLIZ 06/29/2016 Julieanne CottonSanjeev Deveshwar, MD [facility]  . IR GENERIC HISTORICAL   06/29/2016   IR ANGIO VERTEBRAL SEL SUBCLAVIAN INNOMINATE UNI R MOD SED 06/29/2016 Julieanne CottonSanjeev Deveshwar, MD [facility]  . IR GENERIC HISTORICAL  07/28/2016   IR RADIOLOGIST EVAL & MGMT 07/28/2016 [facility]  . NASAL SEPTUM SURGERY    . RADIOLOGY WITH ANESTHESIA N/A 06/29/2016   Procedure: EMBOLIZATION;  Surgeon: Julieanne CottonSanjeev Deveshwar, MD;  Location: [facility];  Service: Radiology;  Laterality: N/A;  . ROTATOR CUFF REPAIR       Social History:  The patient  reports that she has been smoking.  She has a 50.00 pack-year smoking history. She uses smokeless tobacco. She reports that she does not drink alcohol or use drugs.   Family History:  The patient's family history includes Asthma in her other; Heart Problems in her brother; Heart attack in her mother and other; Heart attack (age of onset: 4080) in her father; Heart failure in her other; Osteoporosis in her other; Transient ischemic attack in her mother.    ROS:  Please see the history of present illness. All other systems are reviewed and  Negative to the above problem except as noted.    PHYSICAL EXAM: VS:  BP 138/84   Pulse 63   Ht 5\' 3"  (1.6 m)   Wt 115 lb 6.4 oz (52.3 kg)   SpO2 96%   BMI 20.44 kg/m   GEN: Well nourished, well developed, in no acute distress  HEENT: normal  Neck: no JVD, carotid bruits, or masses Cardiac: RRR; no murmurs, rubs, or gallops,no edema  Respiratory:  clear to auscultation bilaterally, normal work of breathing GI: soft, nontender, nondistended, + BS  No hepatomegaly  MS: no deformity Moving all extremities   Skin: warm and dry, no rash Neuro:  Strength and sensation are intact Psych: euthymic mood, full affect   EKG:  EKG is ordered today.   Lipid Panel    Component Value Date/Time   CHOL 188 07/24/2016 1125   TRIG 120 07/24/2016 1125   HDL 65 07/24/2016 1125   CHOLHDL 2.9 07/24/2016 1125   VLDL 24 07/24/2016 1125   LDLCALC 99 07/24/2016 1125      Wt Readings from Last 3 Encounters:    07/24/16 115 lb 6.4 oz (52.3 kg)  06/29/16 132 lb 7.9 oz (60.1 kg)  06/22/16 115 lb 9.6 oz (52.4 kg)      ASSESSMENT AND PLAN:  1  Dizziness/ syncope  Encouraged her to stay hydrated Will check labs today  2.  Palpitations  Pt denies signif     Current medicines are reviewed at length with the patient today.  The patient does not have concerns regarding medicines.  Signed, Dietrich PatesPaula Adreanne Yono, MD  07/30/2016 11:54 PM    [facility] [address]  [address], [place], [place]  [address] Phone: [phone]; Fax: [phone]

## 2016-07-24 ENCOUNTER — Ambulatory Visit (INDEPENDENT_AMBULATORY_CARE_PROVIDER_SITE_OTHER): Payer: Medicare HMO | Admitting: Internal Medicine

## 2016-07-24 ENCOUNTER — Encounter: Payer: Self-pay | Admitting: Internal Medicine

## 2016-07-24 ENCOUNTER — Encounter (INDEPENDENT_AMBULATORY_CARE_PROVIDER_SITE_OTHER): Payer: Self-pay

## 2016-07-24 VITALS — BP 138/84 | HR 63 | Ht 63.0 in | Wt 115.4 lb

## 2016-07-24 DIAGNOSIS — I059 Rheumatic mitral valve disease, unspecified: Secondary | ICD-10-CM

## 2016-07-24 DIAGNOSIS — I951 Orthostatic hypotension: Secondary | ICD-10-CM

## 2016-07-24 LAB — CBC
HCT: 38.8 % (ref 35.0–45.0)
HEMOGLOBIN: 12.9 g/dL (ref 11.7–15.5)
MCH: 31 pg (ref 27.0–33.0)
MCHC: 33.2 g/dL (ref 32.0–36.0)
MCV: 93.3 fL (ref 80.0–100.0)
MPV: 10.2 fL (ref 7.5–12.5)
PLATELETS: 347 10*3/uL (ref 140–400)
RBC: 4.16 MIL/uL (ref 3.80–5.10)
RDW: 15.3 % — ABNORMAL HIGH (ref 11.0–15.0)
WBC: 9.2 10*3/uL (ref 3.8–10.8)

## 2016-07-24 LAB — LIPID PANEL
Cholesterol: 188 mg/dL (ref <200)
HDL: 65 mg/dL (ref >50)
LDL CALC: 99 mg/dL (ref <100)
TRIGLYCERIDES: 120 mg/dL (ref <150)
Total CHOL/HDL Ratio: 2.9 Ratio (ref <5.0)
VLDL: 24 mg/dL (ref <30)

## 2016-07-24 LAB — BASIC METABOLIC PANEL
BUN: 9 mg/dL (ref 7–25)
CALCIUM: 9.2 mg/dL (ref 8.6–10.4)
CHLORIDE: 97 mmol/L — AB (ref 98–110)
CO2: 31 mmol/L (ref 20–31)
Creat: 0.66 mg/dL (ref 0.60–0.93)
GLUCOSE: 130 mg/dL — AB (ref 65–99)
POTASSIUM: 3.2 mmol/L — AB (ref 3.5–5.3)
SODIUM: 138 mmol/L (ref 135–146)

## 2016-07-24 NOTE — Patient Instructions (Signed)
Your physician recommends that you continue on your current medications as directed. Please refer to the Current Medication list given to you today. Your physician recommends that you return for lab work today BMET, CBC, LIPIDS

## 2016-07-28 ENCOUNTER — Ambulatory Visit (HOSPITAL_COMMUNITY)
Admission: RE | Admit: 2016-07-28 | Discharge: 2016-07-28 | Disposition: A | Discharge status: Home / Self Care | Payer: Medicare HMO | Source: Ambulatory Visit | Attending: Radiology | Admitting: Radiology

## 2016-07-28 DIAGNOSIS — I63411 Cerebral infarction due to embolism of right middle cerebral artery: Secondary | ICD-10-CM

## 2016-07-28 DIAGNOSIS — F17200 Nicotine dependence, unspecified, uncomplicated: Secondary | ICD-10-CM

## 2016-07-28 HISTORY — PX: IR GENERIC HISTORICAL: IMG1180011

## 2016-07-29 ENCOUNTER — Telehealth: Payer: Self-pay | Admitting: Internal Medicine

## 2016-07-29 ENCOUNTER — Encounter (HOSPITAL_COMMUNITY): Payer: Self-pay | Admitting: Interventional Radiology

## 2016-07-29 DIAGNOSIS — E876 Hypokalemia: Secondary | ICD-10-CM

## 2016-07-29 NOTE — Telephone Encounter (Signed)
NEw Message  Pt call requesting to speak with RN about lab results. Please call back to discuss

## 2016-07-29 NOTE — Telephone Encounter (Signed)
Notes Recorded by Pricilla RifflePaula V Ross, MD on 07/28/2016 at 9:29 AM EST CBC is normal  Potassium is a little low Eat bananas, orange juice, Drink something like gatorade  Lipids are excellent Repeat BMET in 1 month   Notified the pt of her lab results and recommendations per Dr Tenny Crawoss, as mentioned above.  Provided pt education on how to obtain K through her diet, by eating fruits and drinking gatorade.  Scheduled the pt a lab appt for one month out to reassess a bmet on 08/28/16.  Pt verbalized understanding and agrees with this plan.

## 2016-08-03 ENCOUNTER — Telehealth: Payer: Self-pay | Admitting: Internal Medicine

## 2016-08-03 NOTE — Telephone Encounter (Signed)
New message ° °Pt is returning call  ° °Please call back °

## 2016-08-03 NOTE — Telephone Encounter (Signed)
Routing to triage

## 2016-08-06 NOTE — Telephone Encounter (Signed)
Call was for lab work.  Pt has already been informed.

## 2016-08-28 ENCOUNTER — Other Ambulatory Visit: Payer: Self-pay

## 2016-09-07 ENCOUNTER — Other Ambulatory Visit: Payer: Medicare HMO

## 2016-09-10 NOTE — Progress Notes (Signed)
Please make sure pt has f/u in Feb

## 2016-09-29 ENCOUNTER — Other Ambulatory Visit (HOSPITAL_COMMUNITY): Payer: Self-pay | Admitting: Interventional Radiology

## 2016-09-29 DIAGNOSIS — I771 Stricture of artery: Secondary | ICD-10-CM

## 2016-09-30 NOTE — Progress Notes (Signed)
Called and scheduled patient with Dr. Tenny Crawoss on 10/26/16 3:15 pm.

## 2016-10-08 ENCOUNTER — Ambulatory Visit (HOSPITAL_COMMUNITY): Payer: Medicare HMO

## 2016-10-08 ENCOUNTER — Ambulatory Visit (HOSPITAL_COMMUNITY)
Admission: RE | Admit: 2016-10-08 | Discharge: 2016-10-08 | Disposition: A | Discharge status: Home / Self Care | Payer: Medicare HMO | Source: Ambulatory Visit | Attending: Interventional Radiology | Admitting: Interventional Radiology

## 2016-10-15 ENCOUNTER — Encounter: Payer: Self-pay | Admitting: Internal Medicine

## 2016-10-19 ENCOUNTER — Other Ambulatory Visit (HOSPITAL_COMMUNITY): Payer: Self-pay | Admitting: Interventional Radiology

## 2016-10-20 ENCOUNTER — Other Ambulatory Visit: Payer: Self-pay

## 2016-10-20 ENCOUNTER — Other Ambulatory Visit (HOSPITAL_COMMUNITY): Payer: Self-pay | Admitting: Interventional Radiology

## 2016-10-20 ENCOUNTER — Inpatient Hospital Stay: Admission: RE | Admit: 2016-10-20 | Payer: Self-pay | Source: Ambulatory Visit

## 2016-10-25 NOTE — Progress Notes (Signed)
Cardiology Office Note   Date:  11/03/2016   ID:  Barbara Hill, DOB 05/30/1941, MRN [id number]  PCP:  Lilia ArgueKAPLAN,KRISTEN, PA-C  Cardiologist:   Dietrich PatesPaula Tyne Banta, MD   F/U of dizziness     History of Present Illness: Barbara Hill is a 76 y.o. female with a history of orthostatic hypotension, SVT, palpitatsoin, COPDand HTN  She had a syncopal spell in summer 2017 (dehydrated)  I saw her in December     She still has signif problems with dzziness  Has had syncopal spells   Got weak  Very weak, tired  In [employer] st[employer]   Happened twice  Last was a couple months ago      No outpatient prescriptions have been marked as taking for the 10/26/16 encounter (Office Visit) with Pricilla RifflePaula Kitt Ledet V, MD.     Allergies:   Dobutamine; Epinephrine; Darvocet [propoxyphene n-acetaminophen]; Excedrin extra strength [aspirin-acetaminophen-caffeine]; Clarithromycin; Doxycycline; Penicillins; Prednisone; and Propoxyphene   Past Medical History:  Diagnosis Date  . Anxiety   . Arrhythmia    palps  . COPD (chronic obstructive pulmonary disease) (HCC)   . Dizziness   . Fatigue   . H/O: hysterectomy   . Headache(784.0)   . Hypertension   . Mitral valve prolapse   . Radiculopathy   . SVT (supraventricular tachycardia) (HCC)   . UTI (lower urinary tract infection)     Past Surgical History:  Procedure Laterality Date  . ABDOMINAL HYSTERECTOMY    . APPENDECTOMY    . CARDIAC CATHETERIZATION  12/95  . CHOLECYSTECTOMY    . IR GENERIC HISTORICAL  06/02/2016   IR RADIOLOGIST EVAL & MGMT 06/02/2016 [facility]  . IR GENERIC HISTORICAL  06/29/2016   IR ANGIO INTRA EXTRACRAN SEL INTERNAL CAROTID UNI R MOD SED 06/29/2016 Julieanne CottonSanjeev Deveshwar, MD [facility]  . IR GENERIC HISTORICAL  06/29/2016   IR ANGIOGRAM FOLLOW UP STUDY 06/29/2016 Julieanne CottonSanjeev Deveshwar, MD [facility]  . IR GENERIC HISTORICAL  06/29/2016   IR NEURO EACH ADD'L AFTER BASIC UNI RIGHT (MS) 06/29/2016 Julieanne CottonSanjeev Deveshwar, MD [facility]  . IR GENERIC  HISTORICAL  06/29/2016   IR 3D INDEPENDENT WKST 06/29/2016 Julieanne CottonSanjeev Deveshwar, MD [facility]  . IR GENERIC HISTORICAL  06/29/2016   IR TRANSCATH/EMBOLIZ 06/29/2016 Julieanne CottonSanjeev Deveshwar, MD [facility]  . IR GENERIC HISTORICAL  06/29/2016   IR ANGIO VERTEBRAL SEL SUBCLAVIAN INNOMINATE UNI R MOD SED 06/29/2016 Julieanne CottonSanjeev Deveshwar, MD [facility]  . IR GENERIC HISTORICAL  07/28/2016   IR RADIOLOGIST EVAL & MGMT 07/28/2016 [facility]  . NASAL SEPTUM SURGERY    . RADIOLOGY WITH ANESTHESIA N/A 06/29/2016   Procedure: EMBOLIZATION;  Surgeon: Julieanne CottonSanjeev Deveshwar, MD;  Location: [facility];  Service: Radiology;  Laterality: N/A;  . ROTATOR CUFF REPAIR       Social History:  The patient  reports that she has been smoking.  She has a 50.00 pack-year smoking history. She uses smokeless tobacco. She reports that she does not drink alcohol or use drugs.   Family History:  The patient's family history includes Asthma in her other; Heart Problems in her brother; Heart attack in her mother and other; Heart attack (age of onset: 9780) in her father; Heart failure in her other; Osteoporosis in her other; Transient ischemic attack in her mother.    ROS:  Please see the history of present illness. All other systems are reviewed and  Negative to the above problem except as noted.    PHYSICAL EXAM: VS:  There  were no vitals taken for this visit.  GEN: Well nourished, well developed, in no acute distress  HEENT: normal  Neck: no JVD, carotid bruits, or masses Cardiac: RRR; no murmurs, rubs, or gallops,no edema  Respiratory:  clear to auscultation bilaterally, normal work of breathing GI: soft, nontender, nondistended, + BS  No hepatomegaly  MS: no deformity Moving all extremities   Skin: warm and dry, no rash Neuro:  Strength and sensation are intact Psych: euthymic mood, full affect   EKG:  EKG is not  ordered today.   Lipid Panel    Component Value Date/Time   CHOL 188 07/24/2016 1125   TRIG 120 07/24/2016  1125   HDL 65 07/24/2016 1125   CHOLHDL 2.9 07/24/2016 1125   VLDL 24 07/24/2016 1125   LDLCALC 99 07/24/2016 1125      Wt Readings from Last 3 Encounters:  07/24/16 115 lb 6.4 oz (52.3 kg)  06/29/16 132 lb 7.9 oz (60.1 kg)  06/22/16 115 lb 9.6 oz (52.4 kg)      ASSESSMENT AND PLAN:  1  Dizziness/orthostasis  Pt's BP dropped with standing  Had to sit For now I would recomm stopping amlodipine  Let BP run up  Need to see back in a couple wks for recheck  Stay hydrated  2.  HTN  As noted above     Current medicines are reviewed at length with the patient today.  The patient does not have concerns regarding medicines.  Signed, Dietrich Pates, MD  11/03/2016 2:30 PM    [facility] [address] [place], [place], [place]  [address] Phone: [phone]; Fax: [phone]

## 2016-10-26 ENCOUNTER — Ambulatory Visit (INDEPENDENT_AMBULATORY_CARE_PROVIDER_SITE_OTHER): Payer: Medicare HMO | Admitting: Internal Medicine

## 2016-10-26 DIAGNOSIS — I471 Supraventricular tachycardia: Secondary | ICD-10-CM | POA: Diagnosis not present

## 2016-10-26 DIAGNOSIS — R42 Dizziness and giddiness: Secondary | ICD-10-CM

## 2016-10-26 DIAGNOSIS — I10 Essential (primary) hypertension: Secondary | ICD-10-CM | POA: Diagnosis not present

## 2016-10-26 NOTE — Patient Instructions (Signed)
Your physician has recommended you make the following change in your medication:  1.) stop amlodipine 10 mg  Your physician recommends that you schedule a follow-up appointment in: 2 weeks with APP.

## 2016-11-03 ENCOUNTER — Other Ambulatory Visit (HOSPITAL_COMMUNITY): Payer: Self-pay | Admitting: Interventional Radiology

## 2016-11-03 DIAGNOSIS — I771 Stricture of artery: Secondary | ICD-10-CM

## 2016-11-03 DIAGNOSIS — R42 Dizziness and giddiness: Secondary | ICD-10-CM

## 2016-11-06 ENCOUNTER — Other Ambulatory Visit (HOSPITAL_COMMUNITY): Payer: Self-pay | Admitting: Interventional Radiology

## 2016-11-06 DIAGNOSIS — I771 Stricture of artery: Secondary | ICD-10-CM

## 2016-11-11 ENCOUNTER — Ambulatory Visit (HOSPITAL_COMMUNITY): Admission: RE | Admit: 2016-11-11 | Payer: Medicare HMO | Source: Ambulatory Visit

## 2016-11-11 ENCOUNTER — Other Ambulatory Visit: Payer: Self-pay | Admitting: Radiology

## 2016-11-11 ENCOUNTER — Other Ambulatory Visit: Payer: Self-pay | Admitting: General Surgery

## 2016-11-12 ENCOUNTER — Encounter (HOSPITAL_COMMUNITY): Payer: Self-pay

## 2016-11-12 ENCOUNTER — Ambulatory Visit: Payer: Self-pay | Admitting: Physician Assistant

## 2016-11-12 ENCOUNTER — Other Ambulatory Visit (HOSPITAL_COMMUNITY): Payer: Self-pay | Admitting: Interventional Radiology

## 2016-11-12 ENCOUNTER — Ambulatory Visit (HOSPITAL_COMMUNITY)
Admission: RE | Admit: 2016-11-12 | Discharge: 2016-11-12 | Disposition: A | Discharge status: Home / Self Care | Payer: Medicare HMO | Source: Ambulatory Visit | Attending: Interventional Radiology | Admitting: Interventional Radiology

## 2016-11-12 DIAGNOSIS — I10 Essential (primary) hypertension: Secondary | ICD-10-CM | POA: Insufficient documentation

## 2016-11-12 DIAGNOSIS — R42 Dizziness and giddiness: Secondary | ICD-10-CM | POA: Insufficient documentation

## 2016-11-12 DIAGNOSIS — I471 Supraventricular tachycardia: Secondary | ICD-10-CM | POA: Diagnosis not present

## 2016-11-12 DIAGNOSIS — Z8249 Family history of ischemic heart disease and other diseases of the circulatory system: Secondary | ICD-10-CM | POA: Insufficient documentation

## 2016-11-12 DIAGNOSIS — Z7951 Long term (current) use of inhaled steroids: Secondary | ICD-10-CM | POA: Diagnosis not present

## 2016-11-12 DIAGNOSIS — Z7902 Long term (current) use of antithrombotics/antiplatelets: Secondary | ICD-10-CM | POA: Insufficient documentation

## 2016-11-12 DIAGNOSIS — Z7982 Long term (current) use of aspirin: Secondary | ICD-10-CM | POA: Insufficient documentation

## 2016-11-12 DIAGNOSIS — Z8774 Personal history of (corrected) congenital malformations of heart and circulatory system: Secondary | ICD-10-CM | POA: Insufficient documentation

## 2016-11-12 DIAGNOSIS — F419 Anxiety disorder, unspecified: Secondary | ICD-10-CM | POA: Insufficient documentation

## 2016-11-12 DIAGNOSIS — Z880 Allergy status to penicillin: Secondary | ICD-10-CM | POA: Insufficient documentation

## 2016-11-12 DIAGNOSIS — J449 Chronic obstructive pulmonary disease, unspecified: Secondary | ICD-10-CM | POA: Diagnosis not present

## 2016-11-12 DIAGNOSIS — Z87440 Personal history of urinary (tract) infections: Secondary | ICD-10-CM | POA: Diagnosis not present

## 2016-11-12 DIAGNOSIS — F17210 Nicotine dependence, cigarettes, uncomplicated: Secondary | ICD-10-CM | POA: Diagnosis not present

## 2016-11-12 DIAGNOSIS — I341 Nonrheumatic mitral (valve) prolapse: Secondary | ICD-10-CM | POA: Diagnosis not present

## 2016-11-12 DIAGNOSIS — I771 Stricture of artery: Secondary | ICD-10-CM

## 2016-11-12 DIAGNOSIS — I6501 Occlusion and stenosis of right vertebral artery: Secondary | ICD-10-CM | POA: Insufficient documentation

## 2016-11-12 HISTORY — PX: IR GENERIC HISTORICAL: IMG1180011

## 2016-11-12 LAB — BASIC METABOLIC PANEL
Anion gap: 9 (ref 5–15)
BUN: 8 mg/dL (ref 6–20)
CALCIUM: 9.6 mg/dL (ref 8.9–10.3)
CO2: 25 mmol/L (ref 22–32)
Chloride: 101 mmol/L (ref 101–111)
Creatinine, Ser: 1 mg/dL (ref 0.44–1.00)
GFR calc Af Amer: >60 mL/min (ref >60)
GFR calc non Af Amer: 54 mL/min — ABNORMAL LOW (ref >60)
GLUCOSE: 91 mg/dL (ref 65–99)
Potassium: 4.6 mmol/L (ref 3.5–5.1)
Sodium: 135 mmol/L (ref 135–145)

## 2016-11-12 LAB — PROTIME-INR
INR: 1.11
PROTHROMBIN TIME: 14.4 s (ref 11.4–15.2)

## 2016-11-12 LAB — CBC
HCT: 36.4 % (ref 36.0–46.0)
Hemoglobin: 11.9 g/dL — ABNORMAL LOW (ref 12.0–15.0)
MCH: 29.2 pg (ref 26.0–34.0)
MCHC: 32.7 g/dL (ref 30.0–36.0)
MCV: 89.4 fL (ref 78.0–100.0)
Platelets: 319 10*3/uL (ref 150–400)
RBC: 4.07 MIL/uL (ref 3.87–5.11)
RDW: 13 % (ref 11.5–15.5)
WBC: 7.3 10*3/uL (ref 4.0–10.5)

## 2016-11-12 LAB — APTT: APTT: 30 s (ref 24–36)

## 2016-11-12 MED ORDER — LIDOCAINE HCL (PF) 1 % IJ SOLN
INTRAMUSCULAR | Status: AC
  Filled 2016-11-12: qty 30

## 2016-11-12 MED ORDER — SODIUM CHLORIDE 0.9 % IV SOLN
Freq: Once | INTRAVENOUS | Status: AC
  Administered 2016-11-12: 10:00:00 via INTRAVENOUS

## 2016-11-12 MED ORDER — MIDAZOLAM HCL 2 MG/2ML IJ SOLN
INTRAMUSCULAR | Status: AC | PRN
  Administered 2016-11-12: 0.5 mg via INTRAVENOUS

## 2016-11-12 MED ORDER — HEPARIN SODIUM (PORCINE) 1000 UNIT/ML IJ SOLN
INTRAMUSCULAR | Status: AC | PRN
  Administered 2016-11-12: 1000 [IU] via INTRAVENOUS

## 2016-11-12 MED ORDER — IOPAMIDOL (ISOVUE-300) INJECTION 61%
INTRAVENOUS | Status: AC
  Administered 2016-11-12: 80 mL
  Filled 2016-11-12: qty 150

## 2016-11-12 MED ORDER — SODIUM CHLORIDE 0.9 % IV SOLN: INTRAVENOUS | Status: AC

## 2016-11-12 MED ORDER — MIDAZOLAM HCL 2 MG/2ML IJ SOLN
INTRAMUSCULAR | Status: AC
  Filled 2016-11-12: qty 2

## 2016-11-12 MED ORDER — LIDOCAINE HCL 1 % IJ SOLN
INTRAMUSCULAR | Status: AC | PRN
  Administered 2016-11-12: 10 mL

## 2016-11-12 MED ORDER — HEPARIN SODIUM (PORCINE) 1000 UNIT/ML IJ SOLN
INTRAMUSCULAR | Status: AC
  Filled 2016-11-12: qty 2

## 2016-11-12 MED ORDER — FENTANYL CITRATE (PF) 100 MCG/2ML IJ SOLN
INTRAMUSCULAR | Status: AC
  Filled 2016-11-12: qty 2

## 2016-11-12 MED ORDER — FENTANYL CITRATE (PF) 100 MCG/2ML IJ SOLN
INTRAMUSCULAR | Status: AC | PRN
  Administered 2016-11-12 (×2): 12.5 ug via INTRAVENOUS

## 2016-11-12 NOTE — Progress Notes (Signed)
[facility]. Pulses checked and WDL. Groin checked with RN and line was marked of the bleed shadow during report. IR tech aware.

## 2016-11-12 NOTE — H&P (Signed)
Chief Complaint: Patient was seen in consultation today for cerebral arteriogram at the request of Dr Azell Der  Referring Physician(s): Dr Azell Der  Supervising Physician: [name]  Patient Status: [facility] - Out-pt  History of Present Illness: Barbara Hill is a 76 y.o. 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  Pt VERY drowsy from 0.5 mg Xanax this am before coming to hospital   Past Medical History:  Diagnosis Date  . Anxiety   . Arrhythmia    palps  . COPD (chronic obstructive pulmonary disease) (HCC)   . Dizziness   . Fatigue   . H/O: hysterectomy   . Headache(784.0)   . Hypertension   . Mitral valve prolapse   . Radiculopathy   . SVT (supraventricular tachycardia) (HCC)   . UTI (lower urinary tract infection)     Past Surgical History:  Procedure Laterality Date  . ABDOMINAL HYSTERECTOMY    . APPENDECTOMY    . CARDIAC CATHETERIZATION  12/95  . CHOLECYSTECTOMY    . IR GENERIC HISTORICAL  06/02/2016   IR RADIOLOGIST EVAL & MGMT 06/02/2016 [facility]  . IR GENERIC HISTORICAL  06/29/2016   IR ANGIO INTRA EXTRACRAN SEL INTERNAL CAROTID UNI R MOD SED 06/29/2016 [name], MD [facility]  . IR GENERIC HISTORICAL  06/29/2016   IR ANGIOGRAM FOLLOW UP STUDY 06/29/2016 [name], MD [facility]  . IR GENERIC HISTORICAL  06/29/2016   IR NEURO EACH ADD'L AFTER BASIC UNI RIGHT (MS) 06/29/2016 [name], MD [facility]  . IR GENERIC HISTORICAL  06/29/2016   IR 3D INDEPENDENT WKST 06/29/2016 [name], MD  [facility]  . IR GENERIC HISTORICAL  06/29/2016   IR TRANSCATH/EMBOLIZ 06/29/2016 [name], MD [facility]  . IR GENERIC HISTORICAL  06/29/2016   IR ANGIO VERTEBRAL SEL SUBCLAVIAN INNOMINATE UNI R MOD SED 06/29/2016 [name], MD [facility]  . IR GENERIC HISTORICAL  07/28/2016   IR RADIOLOGIST EVAL & MGMT 07/28/2016 [facility]  . NASAL SEPTUM SURGERY    . RADIOLOGY WITH ANESTHESIA N/A 06/29/2016   Procedure: EMBOLIZATION;  Surgeon: [name], MD;  Location: [facility] OR;  Service: Radiology;  Laterality: N/A;  . ROTATOR CUFF REPAIR      Allergies: Dobutamine; Epinephrine; Darvocet [propoxyphene n-acetaminophen]; Excedrin extra strength [aspirin-acetaminophen-caffeine]; Clarithromycin; Doxycycline; Penicillins; Prednisone; and Propoxyphene  Medications: Prior to Admission medications   Medication Sig Start Date End Date Taking? Authorizing Provider  acidophilus (RISAQUAD) CAPS capsule Take 1 capsule by mouth daily.   Yes Historical Provider, MD  ALPRAZolam [name]) 0.5 MG tablet Take 0.5 mg by mouth 3 (three) times daily.    Yes Historical Provider, MD  aspirin EC 81 MG tablet Take 81 mg by mouth daily.   Yes Historical Provider, MD  butalbital-acetaminophen-caffeine (FIORICET, ESGIC) 50-325-40 MG per tablet Take 1-2 tablets by mouth every 4 (four) hours as needed for headache or migraine.  07/06/14  Yes Historical Provider, MD  cholecalciferol (VITAMIN D) 1000 units tablet Take 1,000 Units by mouth daily.   Yes Historical Provider, MD  clopidogrel (PLAVIX) 75 MG tablet Take 75 mg by  mouth daily. 06/18/16  Yes Historical Provider, MD  fexofenadine-pseudoephedrine (ALLEGRA-D) 60-120 MG 12 hr tablet Take 1 tablet by mouth daily as needed (allergies).    Yes Historical Provider, MD  flecainide (TAMBOCOR) 100 MG tablet Take 1 tablet (100 mg total) by mouth 2 (two) times daily. 03/31/16  Yes Scott T Alben Spittle, PA-C  FLUoxetine (PROZAC) 20 MG capsule Take 20 mg by mouth 2 (two)  times daily. 06/07/16  Yes Historical Provider, MD  fluticasone (FLONASE) 50 MCG/ACT nasal spray Place 1 spray into both nostrils daily as needed for allergies or rhinitis.  10/11/14  Yes Historical Provider, MD  lactose free nutrition (BOOST) LIQD Take 237 mLs by mouth daily as needed.    Yes Historical Provider, MD  lisinopril (PRINIVIL,ZESTRIL) 10 MG tablet Take 1 tablet (10 mg total) by mouth daily. 03/31/16  Yes Scott Moishe Spice, PA-C  meclizine (ANTIVERT) 12.5 MG tablet Take 12.5 mg by mouth 3 (three) times daily as needed for dizziness.   Yes Historical Provider, MD  metoprolol succinate (TOPROL-XL) 25 MG 24 hr tablet TAKE 1 TABLET BY MOUTH EVERY NIGHT AT BEDTIME 04/01/16  Yes Scott T Weaver, PA-C  omeprazole (PRILOSEC) 40 MG capsule Take 40 mg by mouth daily.     Yes Historical Provider, MD  potassium chloride SA (K-DUR,KLOR-CON) 20 MEQ tablet Take 1 tablet by mouth daily. 08/04/16  Yes Historical Provider, MD  acetaminophen (TYLENOL) 650 MG CR tablet Take 1,300 mg by mouth every 8 (eight) hours as needed for pain.    Historical Provider, MD     Family History  Problem Relation Age of Onset  . Heart attack Mother   . Transient ischemic attack Mother   . Heart attack Father 38  . Heart attack Other   . Asthma Other   . Heart failure Other   . Osteoporosis Other   . Heart Problems Brother   . CAD      Social History   Social History  . Marital status: Widowed    Spouse name: N/A  . Number of children: 2  . Years of education: 12   Occupational History  . Retired    Social History Main Topics  . Smoking status: Current Every Day Smoker    Packs/day: 1.00    Years: 50.00  . Smokeless tobacco: Current User  . Alcohol use No  . Drug use: No  . Sexual activity: Not Asked   Other Topics Concern  . None   Social History Narrative   Lives with significant other, [name]    Caffeine use: none    Review of Systems: A 12 point ROS discussed and pertinent positives are indicated  in the HPI above.  All other systems are negative.  Review of Systems  Constitutional: Positive for fatigue. Negative for activity change, appetite change and fever.  HENT: Negative for tinnitus.   Eyes: Negative for visual disturbance.  Respiratory: Positive for cough. Negative for shortness of breath.   Cardiovascular: Negative for chest pain.  Musculoskeletal: Negative for gait problem.  Neurological: Positive for dizziness, syncope, weakness and light-headedness. Negative for tremors, seizures, facial asymmetry, speech difficulty, numbness and headaches.  Psychiatric/Behavioral: Negative for behavioral problems and confusion.    Vital Signs: BP (!) 103/48   Pulse 64   Temp 98.1 F (36.7 C)   Resp 18   Ht 5\' 3"  (1.6 m)   Wt 114 lb (51.7 kg)   SpO2 94%   BMI 20.19 kg/m   Physical Exam  Constitutional:  She is oriented to person, place, and time.  Very thin  HENT:  Head: Atraumatic.  Eyes: EOM are normal.  Neck: Neck supple.  Cardiovascular:  No murmur heard. Irregular  Pulmonary/Chest: Effort normal and breath sounds normal. She has no wheezes.  Abdominal: Soft. Bowel sounds are normal. There is no tenderness.  Musculoskeletal: Normal range of motion.  Moves all 4s Follows all commands  Neurological: She is alert and oriented to person, place, and time.  Skin: Skin is warm and dry.  Psychiatric: She has a normal mood and affect. Her behavior is normal. Judgment and thought content normal.  Pt answers all questions correctly ; appropriately But so drowsy from po Xanax from home Unable to keep patient's attention during consent discussion Daughter signed consent with pts permission  Nursing note and vitals reviewed.   Mallampati Score:  MD Evaluation Airway: WNL Heart: WNL Abdomen: WNL Chest/ Lungs: WNL ASA  Classification: 3 Mallampati/Airway Score: One  Imaging: No results found.  Labs:  CBC:  Recent Labs  06/30/16 0230  06/30/16 0320  06/30/16 0955 07/24/16 1125 11/12/16 0953  WBC 9.6  --  9.5  --  9.2 7.3  HGB 6.5*  < > 6.6* 8.2* 12.9 11.9*  HCT 19.7*  < > 19.7* 24.6* 38.8 36.4  PLT 205  --  202  --  347 319  < > = values in this interval not displayed.  COAGS:  Recent Labs  06/22/16 0949  INR 1.10  APTT 31    BMP:  Recent Labs  04/10/16 1030 04/11/16 0221 06/22/16 0949 06/30/16 0230 06/30/16 0237 07/24/16 1125  NA 136 139 137 139 141 138  K 3.4* 2.8* 3.5 3.9 4.0 3.2*  CL 102 107 102 113* 111 97*  CO2 29 26 26  21*  --  31  GLUCOSE 98 91 85 103* 106* 130*  BUN 7 <5* 9 8 8 9   CALCIUM 9.3 7.8* 9.8 7.5*  --  9.2  CREATININE 1.02* 0.73 0.93 0.75 0.70 0.66  GFRNONAA 53* >60 59* >60  --   --   GFRAA >60 >60 >60 >60  --   --     LIVER FUNCTION TESTS:  Recent Labs  04/10/16 1030 06/22/16 0949  BILITOT 0.4 0.5  AST 15 21  ALT 7* 12*  ALKPHOS 83 67  PROT 7.2 7.3  ALBUMIN 3.4* 3.9    TUMOR MARKERS: No results for input(s): AFPTM, CEA, CA199, CHROMGRNA in the last 8760 hours.  Assessment and Plan:  Hx R MCA aneurysm pipeline stent 06/2016 Increasing dizziness Now scheduled for cerebral arteriogram for evaluation Risks and Benefits discussed with the patient including, but not limited to bleeding, infection, vascular injury, contrast induced renal failure, stroke or even death. All of the patient's questions were answered, patient is agreeable to proceed. Consent signed and in chart.  Thank you for this interesting consult.  I greatly enjoyed meeting Barbara Hill and look forward to participating in their care.  A copy of this report was sent to the requesting provider on this date.  Electronically Signed: Ralene MuskratURPIN,Teea Ducey A 11/12/2016, 10:53 AM   I spent a total of  30 Minutes   in face to face in clinical consultation, greater than 50% of which was counseling/coordinating care for cerebral arteriogram

## 2016-11-12 NOTE — Discharge Instructions (Signed)
Angiogram, Care After °This sheet gives you information about how to care for yourself after your procedure. Your health care provider may also give you more specific instructions. If you have problems or questions, contact your health care provider. °What can I expect after the procedure? °After the procedure, it is common to have bruising and tenderness at the catheter insertion area. °Follow these instructions at home: °Insertion site care  °· Follow instructions from your health care provider about how to take care of your insertion site. Make sure you: °¨ Wash your hands with soap and water before you change your bandage (dressing). If soap and water are not available, use hand sanitizer. °¨ Change your dressing as told by your health care provider. °¨ Leave stitches (sutures), skin glue, or adhesive strips in place. These skin closures may need to stay in place for 2 weeks or longer. If adhesive strip edges start to loosen and curl up, you may trim the loose edges. Do not remove adhesive strips completely unless your health care provider tells you to do that. °· Do not take baths, swim, or use a hot tub until your health care provider approves. °· You may shower 24-48 hours after the procedure or as told by your health care provider. °¨ Gently wash the site with plain soap and water. °¨ Pat the area dry with a clean towel. °¨ Do not rub the site. This may cause bleeding. °· Do not apply powder or lotion to the site. Keep the site clean and dry. °· Check your insertion site every day for signs of infection. Check for: °¨ Redness, swelling, or pain. °¨ Fluid or blood. °¨ Warmth. °¨ Pus or a bad smell. °Activity  °· Rest as told by your health care provider, usually for 1-2 days. °· Do not lift anything that is heavier than 10 lbs. (4.5 kg) or as told by your health care provider. °· Do not drive for 24 hours if you were given a medicine to help you relax (sedative). °· Do not drive or use heavy machinery while  taking prescription pain medicine. °General instructions  °· Return to your normal activities as told by your health care provider, usually in about a week. Ask your health care provider what activities are safe for you. °· If the catheter site starts bleeding, lie flat and put pressure on the site. If the bleeding does not stop, get help right away. This is a medical emergency. °· Drink enough fluid to keep your urine clear or pale yellow. This helps flush the contrast dye from your body. °· Take over-the-counter and prescription medicines only as told by your health care provider. °· Keep all follow-up visits as told by your health care provider. This is important. °Contact a health care provider if: °· You have a fever or chills. °· You have redness, swelling, or pain around your insertion site. °· You have fluid or blood coming from your insertion site. °· The insertion site feels warm to the touch. °· You have pus or a bad smell coming from your insertion site. °· You have bruising around the insertion site. °· You notice blood collecting in the tissue around the catheter site (hematoma). The hematoma may be painful to the touch. °Get help right away if: °· You have severe pain at the catheter insertion area. °· The catheter insertion area swells very fast. °· The catheter insertion area is bleeding, and the bleeding does not stop when you hold steady pressure on   the area. °· The area near or just beyond the catheter insertion site becomes pale, cool, tingly, or numb. °These symptoms may represent a serious problem that is an emergency. Do not wait to see if the symptoms will go away. Get medical help right away. Call your local emergency services (911 in the [place] Do not drive yourself to the hospital. °Summary °· After the procedure, it is common to have bruising and tenderness at the catheter insertion area. °· After the procedure, it is important to rest and drink plenty of fluids. °· Do not take baths,  swim, or use a hot tub until your health care provider says it is okay to do so. You may shower 24-48 hours after the procedure or as told by your health care provider. °· If the catheter site starts bleeding, lie flat and put pressure on the site. If the bleeding does not stop, get help right away. This is a medical emergency. °This information is not intended to replace advice given to you by your health care provider. Make sure you discuss any questions you have with your health care provider. °Document Released: 02/26/2005 Document Revised: 07/15/2016 Document Reviewed: 07/15/2016 °Elsevier Interactive Patient Education © 2017 [employer] °Moderate Conscious Sedation, Adult, Care After °These instructions provide you with information about caring for yourself after your procedure. Your health care provider may also give you more specific instructions. Your treatment has been planned according to current medical practices, but problems sometimes occur. Call your health care provider if you have any problems or questions after your procedure. °What can I expect after the procedure? °After your procedure, it is common: °· To feel sleepy for several hours. °· To feel clumsy and have poor balance for several hours. °· To have poor judgment for several hours. °· To vomit if you eat too soon. °Follow these instructions at home: °For at least 24 hours after the procedure:  ° °· Do not: °¨ Participate in activities where you could fall or become injured. °¨ Drive. °¨ Use heavy machinery. °¨ Drink alcohol. °¨ Take sleeping pills or medicines that cause drowsiness. °¨ Make important decisions or sign legal documents. °¨ Take care of children on your own. °· Rest. °Eating and drinking  °· Follow the diet recommended by your health care provider. °· If you vomit: °¨ Drink water, juice, or soup when you can drink without vomiting. °¨ Make sure you have little or no nausea before eating solid foods. °General instructions   °· Have a responsible adult stay with you until you are awake and alert. °· Take over-the-counter and prescription medicines only as told by your health care provider. °· If you smoke, do not smoke without supervision. °· Keep all follow-up visits as told by your health care provider. This is important. °Contact a health care provider if: °· You keep feeling nauseous or you keep vomiting. °· You feel light-headed. °· You develop a rash. °· You have a fever. °Get help right away if: °· You have trouble breathing. °This information is not intended to replace advice given to you by your health care provider. Make sure you discuss any questions you have with your health care provider. °Document Released: 05/31/2013 Document Revised: 01/13/2016 Document Reviewed: 11/30/2015 °Elsevier Interactive Patient Education © 2017 [employer] ° °

## 2016-11-12 NOTE — Procedures (Signed)
S/P 4 vessek cerebral arteriogram RT CFA approach. Findings. 1.completely obliterated previously embolized rt MCA aneurysm with pipeline flex flow diverter. 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

## 2016-11-12 NOTE — Progress Notes (Signed)
Bleeding increased at marked sites on dsg.  Dsg removed. Site bleeding slow oozing. Pressure held x Oozing slower but continues. IR notified. Chris IR tech up to room. Pressure held and pressure dsg applied. Pt stable and has no complaints.

## 2016-11-16 ENCOUNTER — Encounter (HOSPITAL_COMMUNITY): Payer: Self-pay | Admitting: Interventional Radiology

## 2016-11-25 ENCOUNTER — Encounter: Payer: Self-pay | Admitting: Physician Assistant

## 2016-11-25 NOTE — Progress Notes (Signed)
Cardiology Office Note    Date:  11/26/2016  ID:  MASHA ORBACH, DOB 09/14/1940, MRN [id number] PCP:  Lilia Argue  Cardiologist:  Tenny Craw   Chief Complaint: f/u dizziness  History of Present Illness:  Barbara Hill is a 76 y.o. female with history of orthostatic hypotension, SVT, palpitations, COPD, HTN, MVP, cerebral aneurysm pipeline stent 2017 who is here to f/u dizziness and syncope. She has been on flecainide for years for her SVT, started by previous cardiologist Dr. Alanda Amass. She has history of syncopal spell in summer 2017 when dehydrated. More recently she saw Dr. Tenny Craw for two episodes of dizziness and syncope, the last occuring several months prior. She was noted to be orthostatic in the office. Amlodipine was stopped. Last echo 03/2016: EF 60-65%, grade 2 DD, mild LVH, mild AI/MR/LAE. 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 Do not see further recommendations regarding this but the patient states she was told she would have a f/u MRI in 6months.  She presents back to clinic continuing to report dizziness upon standing. The typical scenario is that she stands up and begins walking then feels woozy within a few seconds and has to sit down. No recurrent syncope. No CP, palpitations, SOB.  Orthostatics were quite positive today in clinic: Lying 120/54, pulse 57 Sitting 111/61, pulse 60 Standing 18m 70/48, 64/48, pulse 64 Standing 67m 68/50, 69/48, pulse 65    Past Medical History:  Diagnosis Date  . Anxiety   . Arrhythmia    palps  . Brain aneurysm    pipeline stent 2017   . COPD (chronic obstructive pulmonary disease) (HCC)   . Dizziness   . Fatigue   . H/O: hysterectomy   . Headache(784.0)   . Hypertension   . Mitral valve prolapse   .  Orthostasis   . Radiculopathy   . SVT (supraventricular tachycardia) (HCC)   . UTI (lower urinary tract infection)     Past Surgical History:  Procedure Laterality Date  . ABDOMINAL HYSTERECTOMY    . APPENDECTOMY    . CARDIAC CATHETERIZATION  12/95  . CHOLECYSTECTOMY    . IR GENERIC HISTORICAL  06/02/2016   IR RADIOLOGIST EVAL & MGMT 06/02/2016 [facility]  . IR GENERIC HISTORICAL  06/29/2016   IR ANGIO INTRA EXTRACRAN SEL INTERNAL CAROTID UNI R MOD SED 06/29/2016 [name], MD [facility]  . IR GENERIC HISTORICAL  06/29/2016   IR ANGIOGRAM FOLLOW UP STUDY 06/29/2016 [name], MD [facility]  . IR GENERIC HISTORICAL  06/29/2016   IR NEURO EACH ADD'L AFTER BASIC UNI RIGHT (MS) 06/29/2016 [name], MD [facility]  . IR GENERIC HISTORICAL  06/29/2016   IR 3D INDEPENDENT WKST 06/29/2016 [name], MD [facility]  . IR GENERIC HISTORICAL  06/29/2016   IR TRANSCATH/EMBOLIZ 06/29/2016 [name], MD [facility]  . IR GENERIC HISTORICAL  06/29/2016   IR ANGIO VERTEBRAL SEL SUBCLAVIAN INNOMINATE UNI R MOD SED 06/29/2016 [name], MD [facility]  . IR GENERIC HISTORICAL  07/28/2016   IR RADIOLOGIST EVAL & MGMT 07/28/2016 [facility]  . IR GENERIC HISTORICAL  11/12/2016   IR ANGIO INTRA EXTRACRAN SEL COM CAROTID INNOMINATE BILAT MOD SED 11/12/2016 [name], MD [facility]  . IR GENERIC HISTORICAL  11/12/2016  IR ANGIO VERTEBRAL SEL VERTEBRAL UNI L MOD SED 11/12/2016 [name], MD [facility]  . IR GENERIC HISTORICAL  11/12/2016   IR ANGIO VERTEBRAL SEL SUBCLAVIAN INNOMINATE UNI R MOD SED 11/12/2016 [name], MD [facility]  . NASAL SEPTUM SURGERY    . RADIOLOGY WITH ANESTHESIA N/A 06/29/2016   Procedure: EMBOLIZATION;  Surgeon: [name], MD;  Location: [facility];  Service: Radiology;  Laterality: N/A;  . ROTATOR CUFF REPAIR      Current Medications: Current Outpatient Prescriptions  Medication Sig  Dispense Refill  . acetaminophen (TYLENOL) 650 MG CR tablet Take 1,300 mg by mouth every 8 (eight) hours as needed for pain.    Marland Kitchen acidophilus (RISAQUAD) CAPS capsule Take 1 capsule by mouth daily.    Marland Kitchen ALPRAZolam (XANAX) 0.5 MG tablet Take 0.5 mg by mouth 3 (three) times daily.     Marland Kitchen aspirin EC 81 MG tablet Take 81 mg by mouth daily.    . butalbital-acetaminophen-caffeine (FIORICET, ESGIC) 50-325-40 MG per tablet Take 1-2 tablets by mouth every 4 (four) hours as needed for headache or migraine.   3  . cholecalciferol (VITAMIN D) 1000 units tablet Take 1,000 Units by mouth daily.    . clopidogrel (PLAVIX) 75 MG tablet Take 75 mg by mouth daily.  3  . fexofenadine-pseudoephedrine (ALLEGRA-D) 60-120 MG 12 hr tablet Take 1 tablet by mouth daily as needed (allergies).     . flecainide (TAMBOCOR) 100 MG tablet Take 1 tablet (100 mg total) by mouth 2 (two) times daily. 60 tablet 11  . FLUoxetine (PROZAC) 20 MG capsule Take 20 mg by mouth 2 (two) times daily.  11  . fluticasone (FLONASE) 50 MCG/ACT nasal spray Place 1 spray into both nostrils daily as needed for allergies or rhinitis.   11  . lactose free nutrition (BOOST) LIQD Take 237 mLs by mouth daily as needed (nutrional supplement).     Marland Kitchen lisinopril (PRINIVIL,ZESTRIL) 10 MG tablet Take 1 tablet (10 mg total) by mouth daily. 30 tablet 11  . metoprolol succinate (TOPROL-XL) 25 MG 24 hr tablet TAKE 1 TABLET BY MOUTH EVERY NIGHT AT BEDTIME 90 tablet 3  . omeprazole (PRILOSEC) 40 MG capsule Take 40 mg by mouth daily.      . potassium chloride SA (K-DUR,KLOR-CON) 20 MEQ tablet Take 1 tablet by mouth daily.  0   No current facility-administered medications for this visit.      Allergies:   Dobutamine; Epinephrine; Darvocet [propoxyphene n-acetaminophen]; Excedrin extra strength [aspirin-acetaminophen-caffeine]; Clarithromycin; Doxycycline; Penicillins; Prednisone; and Propoxyphene   Social History   Social History  . Marital status: Widowed     Spouse name: N/A  . Number of children: 2  . Years of education: 12   Occupational History  . Retired    Social History Main Topics  . Smoking status: Current Every Day Smoker    Packs/day: 1.00    Years: 50.00  . Smokeless tobacco: Current User  . Alcohol use No  . Drug use: No  . Sexual activity: Not Asked   Other Topics Concern  . None   Social History Narrative   Lives with significant other, [name]    Caffeine use: none     Family History:  Family History  Problem Relation Age of Onset  . Heart attack Mother   . Transient ischemic attack Mother   . Heart attack Father 57  . Heart attack Other   . Asthma Other   . Heart failure Other   .  Osteoporosis Other   . Heart Problems Brother   . CAD      ROS:   Please see the history of present illness. Has experienced 3lb weight loss recently, unintentional. All other systems are reviewed and otherwise negative.    PHYSICAL EXAM:   VS:  BP 140/90   Pulse 60   Ht  (1.6 m)   Wt 111 lb (50.3 kg)   SpO2 98%   BMI 19.66 kg/m   BMI: Body mass index is 19.66 kg/m. GEN: Well developed thin WF, in no acute distress  HEENT: normocephalic, atraumatic Neck: no JVD, carotid bruits, or masses Cardiac: RRR; no murmurs, rubs, or gallops, no edema  Respiratory:  clear to auscultation bilaterally, normal work of breathing GI: soft, nontender, nondistended, + BS MS: no deformity or atrophy  Skin: warm and dry, no rash Neuro:  Alert and Oriented x 3, Strength and sensation are intact, follows commands Psych: euthymic mood, full affect  Wt Readings from Last 3 Encounters:  11/26/16 111 lb (50.3 kg)  11/12/16 114 lb (51.7 kg)  07/24/16 115 lb 6.4 oz (52.3 kg)      Studies/Labs Reviewed:   EKG:  EKG was not ordered today  Recent Labs: 03/31/2016: Brain Natriuretic Peptide 439.9; TSH 1.07 04/11/2016: Magnesium 1.3 06/22/2016: ALT 12 11/12/2016: BUN 8; Creatinine, Ser 1.00; Hemoglobin 11.9; Platelets 319; Potassium  4.6; Sodium 135   Lipid Panel    Component Value Date/Time   CHOL 188 07/24/2016 1125   TRIG 120 07/24/2016 1125   HDL 65 07/24/2016 1125   CHOLHDL 2.9 07/24/2016 1125   VLDL 24 07/24/2016 1125   LDLCALC 99 07/24/2016 1125    Additional studies/ records that were reviewed today include: Summarized above.    ASSESSMENT & PLAN:   1. Dizziness/syncope with continued orthostatic hypotension - will stop lisinopril. She admits she does not drink much fluid at all. Although she has had diastolic dysfunction on prior echo, she has no evidence of clinical heart failure on exam. I've encouraged her to drink 64oz of non-caffeinated fluid per day (water, Gatorade, Propel). She uses salt freely in her diet already. I have advised she wear compression hose daily and to put them on before she even gets out of bed in the AM. She thinks she has some at home that she was previously advised to wear. If not she will pick some up today. Orthostatic precautions reviewed (isometric exercises, standing in one place before moving forward, etc). Next step would be dropping metoprolol dose to 12.5mg  daily but so far the combination of BB/flecainide has afforded her good control of her SVT. Will have her come back in 1 week for a recheck. If BP still dropping upon standing, would consider abdominal binder, reduction of beta blocker dose or addition of low-dose midodrine. 2. HTN - may need to tolerate higher level of blood pressure to prevent such pronounced fall. 3. H/o SVT - quiescent on flecainide/metoprolol. 4. Weight loss - I have also asked her to f/u with her PCP for unintentional weight loss.  Disposition: F/u with APP in 1 week.    Medication Adjustments/Labs and Tests Ordered: Current medicines are reviewed at length with the patient today.  Concerns regarding medicines are outlined above. Medication changes, Labs and Tests ordered today are summarized above and listed in the Patient Instructions  accessible in Encounters.   Thomasene Mohair PA-C  11/26/2016 10:05 AM    [facility] [address] [place], [place],  [place]  [address] Phone: [phone]; Fax: [phone]

## 2016-11-26 ENCOUNTER — Ambulatory Visit (INDEPENDENT_AMBULATORY_CARE_PROVIDER_SITE_OTHER): Payer: Medicare HMO | Admitting: Physician Assistant

## 2016-11-26 ENCOUNTER — Encounter: Payer: Self-pay | Admitting: Physician Assistant

## 2016-11-26 VITALS — BP 140/90 | HR 60 | Ht 63.0 in | Wt 111.0 lb

## 2016-11-26 DIAGNOSIS — I471 Supraventricular tachycardia: Secondary | ICD-10-CM

## 2016-11-26 DIAGNOSIS — I10 Essential (primary) hypertension: Secondary | ICD-10-CM | POA: Diagnosis not present

## 2016-11-26 DIAGNOSIS — R634 Abnormal weight loss: Secondary | ICD-10-CM

## 2016-11-26 DIAGNOSIS — R55 Syncope and collapse: Secondary | ICD-10-CM | POA: Diagnosis not present

## 2016-11-26 DIAGNOSIS — I951 Orthostatic hypotension: Secondary | ICD-10-CM

## 2016-11-26 DIAGNOSIS — R42 Dizziness and giddiness: Secondary | ICD-10-CM | POA: Diagnosis not present

## 2016-11-26 NOTE — Patient Instructions (Addendum)
Medication Instructions:  Your physician has recommended you make the following change in your medication:  1.  STOP the Lisinopril   Labwork: None ordered  Testing/Procedures: None ordered  Follow-Up: Your physician recommends that you schedule a follow-up appointment in: 1 WEEK WITH DAYNA DUNN, PA-C, VIN BHAGHAT, PA-C, OR SCOTT WEAVER, PA-C   Any Other Special Instructions Will Be Listed Below (If Applicable). 1.  WEAR YOUR COMPRESSION HOSE 2.  INCREASE YOUR NON-CAFFEINATED FLUID INTAKE TO 64 OUNCES A DAY  If you need a refill on your cardiac medications before your next appointment, please call your pharmacy.

## 2016-12-01 ENCOUNTER — Encounter: Payer: Self-pay | Admitting: Physician Assistant

## 2016-12-02 ENCOUNTER — Encounter: Payer: Self-pay | Admitting: Physician Assistant

## 2016-12-02 ENCOUNTER — Ambulatory Visit: Payer: Self-pay | Admitting: Physician Assistant

## 2016-12-02 ENCOUNTER — Ambulatory Visit (INDEPENDENT_AMBULATORY_CARE_PROVIDER_SITE_OTHER): Payer: Medicare HMO | Admitting: Physician Assistant

## 2016-12-02 VITALS — Ht 63.0 in | Wt 113.0 lb

## 2016-12-02 DIAGNOSIS — I951 Orthostatic hypotension: Secondary | ICD-10-CM | POA: Diagnosis not present

## 2016-12-02 DIAGNOSIS — F17200 Nicotine dependence, unspecified, uncomplicated: Secondary | ICD-10-CM | POA: Diagnosis not present

## 2016-12-02 DIAGNOSIS — I471 Supraventricular tachycardia: Secondary | ICD-10-CM | POA: Diagnosis not present

## 2016-12-02 NOTE — Patient Instructions (Addendum)
Medication Instructions:  No changes. Call if you have more dizziness with standing so that we can start a new medication.   Labwork: None   Testing/Procedures: None   Follow-Up: [employer], [facility] 01/11/17 @ 10:45 SAME DAY DR. Tenny Craw IS IN THE OFFICE  Any Other Special Instructions Will Be Listed Below (If Applicable).  If you need a refill on your cardiac medications before your next appointment, please call your pharmacy.

## 2016-12-02 NOTE — Progress Notes (Signed)
Cardiology Office Note:    Date:  12/02/2016   ID:  Barbara Hill, DOB 1940/12/11, MRN [id number]  PCP:  Lilia Argue  Cardiologist:  Dr. Dietrich Pates   Electrophysiologist:  n/a  Referring MD: Richmond Campbell., PA-C    Chief Complaint  Patient presents with  . Follow-up    Orthostatic Hypotension    History of Present Illness:    Barbara Hill is a 76 y.o. female with a hx of SVT, palpitations, dizziness, HTN, Orthostatic hypotension. She has been on flecainide for years (started by previous cardiologist - Dr. Alanda Amass). 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   She was seen by Dr. Tenny Craw in March with significant symptomatically orthostatic hypotension. Amlodipine was stopped.  She then saw Ronie Spies, PA-C 11/26/16 in follow up.  Blood pressure continued to drop significantly from lying to standing (120/54 >> 68/50). Lisinopril was stopped.   She returns for close follow-up. She is here today with her husband. Over the past several days, she has felt the best she is felt in several years. She denies dizziness, syncope or near syncope. She denies palpitations. She denies chest pain, significant dyspnea, orthopnea, PND or edema. She had a lot of hip pain when she got out of the car today and felt like she was going to fall but denies dizziness.    Prior CV studies:   The following studies were reviewed today:  Echo 04/11/16 Mild LVH, EF 60-65, normal wall motion, grade 2 diastolic dysfunction, mild AI, mild MR, mild LAE  Myoview 10/12 Normal stress nuclear study.   LHC 10/02 Normal coronary arteries   Echo 11/01 EF 60%, mild anterior MVP with trivial MR, PASP 35-40 mmHg, LAE   Past Medical History:  Diagnosis Date  . Anxiety   . Brain aneurysm    pipeline stent 2017   . COPD (chronic obstructive pulmonary  disease) (HCC)   . Fatigue   . H/O: hysterectomy   . Headache(784.0)   . History of cardiac catheterization    LHC 10/02: Normal coronary arteries  . History of nuclear stress test    Myoview 10/12: Normal stress nuclear study.  . Hypertension   . Mitral valve prolapse    Echo 8/17:Mild LVH, EF 60-65, normal wall motion, grade 2 diastolic dysfunction, mild AI, mild MR, mild LAE // echo 11/01: EF 60%, mild anterior MVP with trivial MR, PASP 35-40 mmHg, LAE  . Orthostasis   . PSVT (paroxysmal supraventricular tachycardia) (HCC)    palps  . Radiculopathy     Past Surgical History:  Procedure Laterality Date  . ABDOMINAL HYSTERECTOMY    . APPENDECTOMY    . CARDIAC CATHETERIZATION  12/95  . CHOLECYSTECTOMY    . IR GENERIC HISTORICAL  06/02/2016   IR RADIOLOGIST EVAL & MGMT 06/02/2016 [facility]  . IR GENERIC HISTORICAL  06/29/2016   IR ANGIO INTRA EXTRACRAN SEL INTERNAL CAROTID UNI R MOD SED 06/29/2016 [name], MD [facility]  . IR GENERIC HISTORICAL  06/29/2016   IR ANGIOGRAM FOLLOW UP STUDY 06/29/2016 [name], MD [facility]  . IR GENERIC HISTORICAL  06/29/2016   IR NEURO EACH ADD'L AFTER BASIC UNI RIGHT (MS) 06/29/2016 [name], MD [facility]  . IR GENERIC HISTORICAL  06/29/2016   IR 3D INDEPENDENT WKST 06/29/2016 [name], MD [facility]  . IR GENERIC HISTORICAL  06/29/2016   IR TRANSCATH/EMBOLIZ 06/29/2016 [name], MD [facility]  . IR GENERIC HISTORICAL  06/29/2016   IR ANGIO VERTEBRAL SEL SUBCLAVIAN INNOMINATE UNI R MOD SED 06/29/2016 [name], MD [facility]  . IR GENERIC HISTORICAL  07/28/2016   IR RADIOLOGIST EVAL & MGMT 07/28/2016 [facility]  . IR GENERIC HISTORICAL  11/12/2016   IR ANGIO INTRA EXTRACRAN SEL COM CAROTID INNOMINATE BILAT MOD SED 11/12/2016 [name], MD [facility]  . IR GENERIC HISTORICAL  11/12/2016   IR ANGIO VERTEBRAL SEL VERTEBRAL UNI L MOD SED 11/12/2016 [name], MD  [facility]  . IR GENERIC HISTORICAL  11/12/2016   IR ANGIO VERTEBRAL SEL SUBCLAVIAN INNOMINATE UNI R MOD SED 11/12/2016 [name], MD [facility]  . NASAL SEPTUM SURGERY    . RADIOLOGY WITH ANESTHESIA N/A 06/29/2016   Procedure: EMBOLIZATION;  Surgeon: [name], MD;  Location[facility];  Service: Radiology;  Laterality: N/A;  . ROTATOR CUFF REPAIR      Current Medications: Current Meds  Medication Sig  . acetaminophen (TYLENOL) 650 MG CR tablet Take 1,300 mg by mouth every 8 (eight) hours as needed for pain.  Marland Kitchen acidophilus (RISAQUAD) CAPS capsule Take 1 capsule by mouth daily.  Marland Kitchen ALPRAZolam (XANAX) 0.5 MG tablet Take 0.5 mg by mouth 3 (three) times daily.   Marland Kitchen aspirin EC 81 MG tablet Take 81 mg by mouth daily.  . butalbital-acetaminophen-caffeine (FIORICET, ESGIC) 50-325-40 MG per tablet Take 1-2 tablets by mouth every 4 (four) hours as needed for headache or migraine.   . cholecalciferol (VITAMIN D) 1000 units tablet Take 1,000 Units by mouth daily.  . clopidogrel (PLAVIX) 75 MG tablet Take 75 mg by mouth daily.  . fexofenadine-pseudoephedrine (ALLEGRA-D) 60-120 MG 12 hr tablet Take 1 tablet by mouth daily as needed (allergies).   . flecainide (TAMBOCOR) 100 MG tablet Take 1 tablet (100 mg total) by mouth 2 (two) times daily.  Marland Kitchen FLUoxetine (PROZAC) 20 MG capsule Take 20 mg by mouth 2 (two) times daily.  . fluticasone (FLONASE) 50 MCG/ACT nasal spray Place 1 spray into both nostrils daily as needed for allergies or rhinitis.   Marland Kitchen lactose free nutrition (BOOST) LIQD Take 237 mLs by mouth daily as needed (nutrional supplement).   . metoprolol succinate (TOPROL-XL) 25 MG 24 hr tablet TAKE 1 TABLET BY MOUTH EVERY NIGHT AT BEDTIME  . omeprazole (PRILOSEC) 40 MG capsule Take 40 mg by mouth daily.    . potassium chloride SA (K-DUR,KLOR-CON) 20 MEQ tablet Take 1 tablet by mouth daily.     Allergies:   Dobutamine; Epinephrine; Darvocet [propoxyphene n-acetaminophen]; Excedrin extra  strength [aspirin-acetaminophen-caffeine]; Clarithromycin; Doxycycline; Penicillins; Prednisone; and Propoxyphene   Social History   Social History  . Marital status: Widowed    Spouse name: N/A  . Number of children: 2  . Years of education: 12   Occupational History  . Retired    Social History Main Topics  . Smoking status: Current Every Day Smoker    Packs/day: 1.00    Years: 50.00  . Smokeless tobacco: Current User  . Alcohol use No  . Drug use: No  . Sexual activity: Not Asked   Other Topics Concern  . None   Social History Narrative   Lives with significant other, [name]    Caffeine use: none     Family History  Problem Relation Age of Onset  . Heart attack Mother   . Transient ischemic attack Mother   . Heart attack  Father 49  . Heart attack Other   . Asthma Other   . Heart failure Other   . Osteoporosis Other   . Heart Problems Brother   . CAD       ROS:   Please see the history of present illness.    Review of Systems  Cardiovascular: Negative for syncope.  Musculoskeletal: Positive for back pain.  Genitourinary: Positive for incomplete emptying.  Neurological: Positive for headaches and loss of balance.  Psychiatric/Behavioral: The patient is nervous/anxious.    All other systems reviewed and are negative.   EKGs/Labs/Other Test Reviewed:    EKG:  EKG is  ordered today.  The ekg ordered today demonstrates NSR, HR 64, first-degree AV block, PR 258 ms, left axis deviation, nonspecific ST-T wave changes, QTc 499 ms, no change from prior tracings  Recent Labs: 03/31/2016: Brain Natriuretic Peptide 439.9; TSH 1.07 04/11/2016: Magnesium 1.3 06/22/2016: ALT 12 11/12/2016: BUN 8; Creatinine, Ser 1.00; Hemoglobin 11.9; Platelets 319; Potassium 4.6; Sodium 135   Recent Lipid Panel    Component Value Date/Time   CHOL 188 07/24/2016 1125   TRIG 120 07/24/2016 1125   HDL 65 07/24/2016 1125   CHOLHDL 2.9 07/24/2016 1125   VLDL 24 07/24/2016 1125    LDLCALC 99 07/24/2016 1125     Physical Exam:    VS:  Ht  (1.6 m)   Wt 113 lb (51.3 kg)   BMI 20.02 kg/m     Orthostatic VS for the past 24 hrs (Last 3 readings):  BP- Lying Pulse- Lying BP- Sitting Pulse- Sitting BP- Standing at 0 minutes Pulse- Standing at 0 minutes BP- Standing at 3 minutes Pulse- Standing at 3 minutes  12/02/16 1115 163/79 60 144/73 60 133/65 61 110/66 62    Wt Readings from Last 3 Encounters:  12/02/16 113 lb (51.3 kg)  11/26/16 111 lb (50.3 kg)  11/12/16 114 lb (51.7 kg)    Physical Exam  Constitutional: She is oriented to person, place, and time. She appears well-developed. No distress.  Thin, frail-appearing  HENT:  Head: Normocephalic and atraumatic.  Eyes: No scleral icterus.  Neck: Normal range of motion. No JVD present.  Cardiovascular: Normal rate, regular rhythm, S1 normal, S2 normal and normal heart sounds.   No murmur heard. Pulmonary/Chest: She has decreased breath sounds. She has no wheezes. She has no rhonchi. She has no rales.  Abdominal: Soft. There is no tenderness.  Musculoskeletal: She exhibits no edema.  Neurological: She is alert and oriented to person, place, and time.  Skin: Skin is warm and dry.  Psychiatric: She has a normal mood and affect.    ASSESSMENT:    1. Orthostatic hypotension   2. Paroxysmal SVT (supraventricular tachycardia) (HCC)   3. Tobacco use disorder    PLAN:    In order of problems listed above:  1. Orthostatic hypotension - She still has a significant blood pressure drop from lying to standing with supine hypertension. However, she is currently not symptomatic. At this point, I am not convinced that we need to place her on medical therapy. I think conservative measures should be continued for now with compression stockings, liberalization of salt in her diet and keeping the head of her bed elevated. Considerations for medical therapy include midodrine or pyridostigmine.  If she has orthostatic  intolerance over the next couple of weeks, she will contact our office to initiate therapy. Otherwise, we will see her back in 4-6 weeks.  2. Paroxysmal SVT (supraventricular tachycardia) (  HCC) -  Quiescent on Flecainide, beta-blocker.   3. Tobacco use disorder - I have recommended cessation.   Dispo:  Return in about 4 weeks (around 12/30/2016) for w/ Dr. Tenny Craw or Tereso Newcomer, PA-C, Close Follow Up.   Medication Adjustments/Labs and Tests Ordered: Current medicines are reviewed at length with the patient today.  Concerns regarding medicines are outlined above.  Medication changes, Labs and Tests ordered today are outlined in the Patient Instructions noted below. Patient Instructions  Medication Instructions:  No changes. Call if you have more dizziness with standing so that we can start a new medication.   Labwork: None   Testing/Procedures: None   Follow-Up: [employer], [facility] 01/11/17 @ 10:45 SAME DAY DR. Tenny Craw IS IN THE OFFICE  Any Other Special Instructions Will Be Listed Below (If Applicable).  If you need a refill on your cardiac medications before your next appointment, please call your pharmacy.   Signed, Tereso Newcomer, PA-C  12/02/2016 4:45 PM    [facility] [address] [place], [place], [place]  [address] Phone: [phone]; Fax: [phone]

## 2016-12-24 ENCOUNTER — Encounter: Payer: Self-pay | Admitting: *Deleted

## 2017-01-11 ENCOUNTER — Ambulatory Visit: Payer: Self-pay | Admitting: Physician Assistant

## 2017-01-20 ENCOUNTER — Ambulatory Visit: Payer: Self-pay | Admitting: Physician Assistant

## 2017-01-26 ENCOUNTER — Ambulatory Visit: Payer: Self-pay | Admitting: Physician Assistant

## 2017-02-09 ENCOUNTER — Ambulatory Visit: Payer: Self-pay | Admitting: Physician Assistant

## 2017-02-12 ENCOUNTER — Ambulatory Visit (INDEPENDENT_AMBULATORY_CARE_PROVIDER_SITE_OTHER): Payer: Medicare HMO | Admitting: Physician Assistant

## 2017-02-12 ENCOUNTER — Encounter: Payer: Self-pay | Admitting: Physician Assistant

## 2017-02-12 VITALS — Ht 63.0 in | Wt 110.8 lb

## 2017-02-12 DIAGNOSIS — I471 Supraventricular tachycardia: Secondary | ICD-10-CM | POA: Diagnosis not present

## 2017-02-12 DIAGNOSIS — F17200 Nicotine dependence, unspecified, uncomplicated: Secondary | ICD-10-CM

## 2017-02-12 DIAGNOSIS — I951 Orthostatic hypotension: Secondary | ICD-10-CM

## 2017-02-12 MED ORDER — PYRIDOSTIGMINE BROMIDE 60 MG PO TABS: 30.0000 mg | ORAL_TABLET | Freq: Three times a day (TID) | ORAL | 11 refills | Status: DC

## 2017-02-12 NOTE — Progress Notes (Signed)
Cardiology Office Note:    Date:  02/12/2017   ID:  Barbara Hill, DOB 1940/11/24, MRN [id number]  PCP:  Richmond Campbell., PA-C  Cardiologist:  Dr. Dietrich Pates    Referring MD: Richmond Campbell., PA-C   Chief Complaint  Patient presents with  . Follow-up    orthostatic hypotension    History of Present Illness:    Barbara Hill is a 76 y.o. female with a hx of SVT, palpitations, dizziness, HTN, rthostatic hypotension. She has been on flecainide for years (started by previous cardiologist - Dr. Alanda Amass). 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  She has been seen recently with symptomatic orthostatic hypotension.  Her Amlodipine and Lisinopril were stopped.  She was seen in follow up 4/18 and was feeling much better.   Barbara Hill returns for follow up  She is here with her husband.  She is still having dizzy spells and feels like she will fall.  This happens only when standing. She denies syncope.  She has not been c/w using compression stockings.  She thinks she feels better when using them.  She was here with her husband yesterday to get a Holter placed.  She tells me she almost fell and had to get the RN help her sit down.  She denies orthopnea, paroxysmal nocturnal dyspnea, edema.  She denies chest pain.  Prior CV studies:   The following studies were reviewed today:  Echo 04/11/16 Mild LVH, EF 60-65, normal wall motion, grade 2 diastolic dysfunction, mild AI, mild MR, mild LAE  Myoview 10/12 Normal stress nuclear study.  LHC 10/02 Normal coronary arteries  Echo 11/01 EF 60%, mild anterior MVP with trivial MR, PASP 35-40 mmHg, LAE  Past Medical History:  Diagnosis Date  . Anxiety   . Brain aneurysm    pipeline stent 2017   . COPD (chronic obstructive pulmonary disease) (HCC)   . Fatigue   . H/O: hysterectomy   .  Headache(784.0)   . History of cardiac catheterization    LHC 10/02: Normal coronary arteries  . History of nuclear stress test    Myoview 10/12: Normal stress nuclear study.  . Hypertension   . Mitral valve prolapse    Echo 8/17:Mild LVH, EF 60-65, normal wall motion, grade 2 diastolic dysfunction, mild AI, mild MR, mild LAE // echo 11/01: EF 60%, mild anterior MVP with trivial MR, PASP 35-40 mmHg, LAE  . Orthostasis   . PSVT (paroxysmal supraventricular tachycardia) (HCC)    palps  . Radiculopathy     Past Surgical History:  Procedure Laterality Date  . ABDOMINAL HYSTERECTOMY    . APPENDECTOMY    . CARDIAC CATHETERIZATION  12/95  . CHOLECYSTECTOMY    . IR GENERIC HISTORICAL  06/02/2016   IR RADIOLOGIST EVAL & MGMT 06/02/2016 [facility]  . IR GENERIC HISTORICAL  06/29/2016   IR ANGIO INTRA EXTRACRAN SEL INTERNAL CAROTID UNI R MOD SED 06/29/2016 [name], MD [facility]  . IR GENERIC HISTORICAL  06/29/2016   IR ANGIOGRAM FOLLOW UP STUDY 06/29/2016 [name], MD [facility]  . IR GENERIC HISTORICAL  06/29/2016   IR NEURO EACH ADD'L AFTER BASIC UNI RIGHT (MS) 06/29/2016 [name], MD [facility]  . IR GENERIC HISTORICAL  06/29/2016   IR 3D INDEPENDENT WKST 06/29/2016 [name], MD [facility]  . IR GENERIC HISTORICAL  06/29/2016   IR TRANSCATH/EMBOLIZ 06/29/2016 [name], MD [facility]  . IR GENERIC HISTORICAL  06/29/2016   IR ANGIO VERTEBRAL SEL SUBCLAVIAN INNOMINATE UNI R MOD SED 06/29/2016 [name], MD [facility]  . IR GENERIC HISTORICAL  07/28/2016   IR RADIOLOGIST EVAL & MGMT 07/28/2016 [facility]  . IR GENERIC HISTORICAL  11/12/2016   IR ANGIO INTRA EXTRACRAN SEL COM CAROTID INNOMINATE BILAT MOD SED 11/12/2016 [name], MD [facility]  . IR GENERIC HISTORICAL  11/12/2016   IR ANGIO VERTEBRAL SEL VERTEBRAL UNI L MOD SED 11/12/2016 [name], MD [facility]  . IR GENERIC HISTORICAL  11/12/2016   IR  ANGIO VERTEBRAL SEL SUBCLAVIAN INNOMINATE UNI R MOD SED 11/12/2016 [name], MD [facility]  . NASAL SEPTUM SURGERY    . RADIOLOGY WITH ANESTHESIA N/A 06/29/2016   Procedure: EMBOLIZATION;  Surgeon: [name], MD;  Location: [facility];  Service: Radiology;  Laterality: N/A;  . ROTATOR CUFF REPAIR      Current Medications: Current Meds  Medication Sig  . acetaminophen (TYLENOL) 650 MG CR tablet Take 1,300 mg by mouth every 8 (eight) hours as needed for pain.  Marland Kitchen acidophilus (RISAQUAD) CAPS capsule Take 1 capsule by mouth daily.  Marland Kitchen ALPRAZolam (XANAX) 0.5 MG tablet Take 0.5 mg by mouth 3 (three) times daily.   Marland Kitchen aspirin EC 81 MG tablet Take 81 mg by mouth daily.  . butalbital-acetaminophen-caffeine (FIORICET, ESGIC) 50-325-40 MG per tablet Take 1-2 tablets by mouth every 4 (four) hours as needed for headache or migraine.   . cholecalciferol (VITAMIN D) 1000 units tablet Take 1,000 Units by mouth daily.  . clopidogrel (PLAVIX) 75 MG tablet Take 75 mg by mouth daily.  . fexofenadine-pseudoephedrine (ALLEGRA-D) 60-120 MG 12 hr tablet Take 1 tablet by mouth daily as needed (allergies).   . flecainide (TAMBOCOR) 100 MG tablet Take 1 tablet (100 mg total) by mouth 2 (two) times daily.  Marland Kitchen FLUoxetine (PROZAC) 20 MG capsule Take 20 mg by mouth 2 (two) times daily.  . fluticasone (FLONASE) 50 MCG/ACT nasal spray Place 1 spray into both nostrils daily as needed for allergies or rhinitis.   Marland Kitchen lactose free nutrition (BOOST) LIQD Take 237 mLs by mouth daily as needed (nutrional supplement).   . metoprolol succinate (TOPROL-XL) 25 MG 24 hr tablet TAKE 1 TABLET BY MOUTH EVERY NIGHT AT BEDTIME  . omeprazole (PRILOSEC) 40 MG capsule Take 40 mg by mouth daily.    . potassium chloride SA (K-DUR,KLOR-CON) 20 MEQ tablet Take 1 tablet by mouth daily.     Allergies:   Dobutamine; Epinephrine; Darvocet [propoxyphene n-acetaminophen]; Excedrin extra strength [aspirin-acetaminophen-caffeine];  Clarithromycin; Doxycycline; Penicillins; Prednisone; and Propoxyphene   Social History   Social History  . Marital status: Widowed    Spouse name: N/A  . Number of children: 2  . Years of education: 12   Occupational History  . Retired    Social History Main Topics  . Smoking status: Current Every Day Smoker    Packs/day: 1.00    Years: 50.00  . Smokeless tobacco: Current User  . Alcohol use No  . Drug use: No  . Sexual activity: Not Asked   Other Topics Concern  . None   Social History Narrative   Lives with significant other, [name]    Caffeine use: none     Family Hx: The patient's family history includes Asthma in her other; Heart Problems in her brother; Heart attack in her mother and other; Heart attack (  age of onset: 8280) in her father; Heart failure in her other; Osteoporosis in her other; Transient ischemic attack in her mother.  ROS:   Please see the history of present illness.    ROS All other systems reviewed and are negative.   EKGs/Labs/Other Test Reviewed:    EKG:  EKG is not ordered today.  The ekg ordered today demonstrates n/a  Recent Labs: 03/31/2016: Brain Natriuretic Peptide 439.9; TSH 1.07 04/11/2016: Magnesium 1.3 06/22/2016: ALT 12 11/12/2016: BUN 8; Creatinine, Ser 1.00; Hemoglobin 11.9; Platelets 319; Potassium 4.6; Sodium 135   Recent Lipid Panel Lab Results  Component Value Date/Time   CHOL 188 07/24/2016 11:25 AM   TRIG 120 07/24/2016 11:25 AM   HDL 65 07/24/2016 11:25 AM   CHOLHDL 2.9 07/24/2016 11:25 AM   LDLCALC 99 07/24/2016 11:25 AM    Physical Exam:    VS:  Ht 5\' 3"  (1.6 m)   Wt 110 lb 12.8 oz (50.3 kg)   BMI 19.63 kg/m     Orthostatic VS for the past 24 hrs (Last 3 readings):  BP- Lying Pulse- Lying BP- Sitting Pulse- Sitting BP- Standing at 0 minutes Pulse- Standing at 0 minutes BP- Standing at 3 minutes Pulse- Standing at 3 minutes  02/12/17 1209 148/72 54 128/68 53 (!) 82/49 57 (!) 89/52 59    Wt Readings from  Last 3 Encounters:  02/12/17 110 lb 12.8 oz (50.3 kg)  12/02/16 113 lb (51.3 kg)  11/26/16 111 lb (50.3 kg)     Physical Exam  Constitutional: She is oriented to person, place, and time. She appears well-developed and well-nourished.  HENT:  Head: Normocephalic and atraumatic.  Eyes: No scleral icterus.  Neck: No JVD present.  Cardiovascular: Normal rate and regular rhythm.   No murmur heard. Pulmonary/Chest: Effort normal. She has no rales.  Abdominal: There is no tenderness.  Musculoskeletal: She exhibits no edema.  Neurological: She is alert and oriented to person, place, and time.  Skin: Skin is warm and dry.  Psychiatric: She has a normal mood and affect.    ASSESSMENT:    1. Orthostatic hypotension   2. Paroxysmal SVT (supraventricular tachycardia) (HCC)   3. Tobacco use disorder    PLAN:    In order of problems listed above:  1. Orthostatic hypotension She continues to have issues with dizziness with standing.  Her BP continues to drop significantly from lying to standing.  I reviewed her case with Dr. Dietrich PatesPaula Ross today.  We considered midodrine.  However, to prevent supine HTN, we have decided to try Pyridostigmine to attenuate her BP drop.    -  Pyridostigmine 30 mg Three times a day   -  Continue compression stockings.   -  FU ~ 1 month   2. Paroxysmal SVT (supraventricular tachycardia) (HCC) - No recurrence. Continue Flecainide, Metoprolol succinate.   3. Tobacco use disorder - She continues to smoke.   Dispo:  Return in about 1 month (around 03/14/2017) for Close Follow Up, w/ Dr. Tenny Crawoss, or Tereso NewcomerScott Nikoletta Varma, PA-C.   Medication Adjustments/Labs and Tests Ordered: Current medicines are reviewed at length with the patient today.  Concerns regarding medicines are outlined above.  Orders/Tests:  No orders of the defined types were placed in this encounter.  Medication changes: Meds ordered this encounter  Medications  . pyridostigmine (MESTINON) 60 MG tablet     Sig: Take 0.5 tablets (30 mg total) by mouth 3 (three) times daily.    Dispense:  45 tablet  Refill:  11    Order Specific Question:   Supervising Provider    Answer:   END, CHRISTOPHER [[id number]]   Signed, Tereso Newcomer, PA-C  02/12/2017 12:45 PM    [facility] [address] [place], [place], [place]  [address] Phone: [phone]; Fax: [phone]

## 2017-02-12 NOTE — Patient Instructions (Addendum)
Medication Instructions:  1. START PYRIDOSTIGMINE 60 MG TABLET WITH THE DIRECTIONS ON BOTTLE TO TAKE 1/2 TABLET 3 TIMES A DAY  Labwork: NONE ORDERED  Testing/Procedures: NONE ORDERED  Follow-Up: 03/05/17 @ 9:45 WITH SCOTT WEAVER, PAC SAME DAY DR. Tenny CrawOSS IS IN THE OFFICE.  Any Other Special Instructions Will Be Listed Below (If Applicable).     If you need a refill on your cardiac medications before your next appointment, please call your pharmacy.

## 2017-02-19 ENCOUNTER — Telehealth: Payer: Self-pay | Admitting: Physician Assistant

## 2017-02-19 NOTE — Telephone Encounter (Signed)
Called pt back  She is having short lived spells of palpitations   Lies down  Takes 1/2 Xanax and better Says her dizziness is much better since seen in cinic by [name] and meds changed  I recomm she try 1/2 metoprolol in AM   Continue other meds  Call if gets worse

## 2017-02-19 NOTE — Telephone Encounter (Signed)
Pt calling saying her hear is out of rhythm. This call should had been routed to Triage. I will route message to Triage for  Further evaluation of pt's symptoms.

## 2017-02-19 NOTE — Telephone Encounter (Signed)
Returned call to patient-patient reports she is having palpitations x 1 week.   Reports she has had multiple episodes this week, very short but is making her very tired.  Reports she feels as if her heart is "skipping" beats and speeds up. Reports episode earlier today, reports she took 1/2 xanex and laid down and it resolved quickly.  Denies dizziness, lightheadedness, CP, SOB.  Only reports fatigue after episodes.   Reports HR reading of 60 with "20 skipped beats".   No BP readings to record.  Reports she had appt on 7/13 but wanted to make sure she didn't need to move this up.   Reports before brain surgery she was taking metoprolol 50mg  BID and now is taking it once daily.  Wondering if this should be BID again.  Still taking flecainide 100mg  BID.   Not taking Allegra D (listed on med list).  Advised to continue to monitor, will route to Dr. Tenny Crawoss for recommendations.    Aware if she becomes symptomatic to go to ER to be evaluated.  Patient aware and verbalized understanding.

## 2017-02-19 NOTE — Telephone Encounter (Signed)
New message    Pt is calling. She states that her heart is been out of rhythm. She said she feels tired. Please call.

## 2017-02-26 ENCOUNTER — Other Ambulatory Visit: Payer: Self-pay | Admitting: Physician Assistant

## 2017-02-26 DIAGNOSIS — I10 Essential (primary) hypertension: Secondary | ICD-10-CM

## 2017-02-26 DIAGNOSIS — I471 Supraventricular tachycardia: Secondary | ICD-10-CM

## 2017-03-02 NOTE — Telephone Encounter (Signed)
I called patient to verify medications since she reported she was taking Toprol 50 daily but her medicine list indicates Toprol 25 mg.  Patient's bottle is 25 mg (she checked during the call). She takes 25 mg at bedtime, and was instructed by Dr. Tenny Crawoss to take 12.5 every AM, but her palpitations were so bothersome that she just started taking a whole tablet in AM too.   So currently she takes Toprol XL 25 mg BID.  Also, she stopped the Mestinon "4-5 days ago" because her blood pressure one day was 184/60s.  She was sitting down and did not take any readings standing up.  I reviewed with her why she is on this medication and advised to restart it today and take as directed until she comes for her appointment on 7/13.  She wears the compression stockings intermittently for short periods because they are too hot.   Advised if she checks any more blood pressures at home--to take one sitting and then stand up quietly for 1-2 minutes and take another one and bring those readings with her.  Pt verbalizes understanding and agreement with this plan.

## 2017-03-02 NOTE — Telephone Encounter (Signed)
I tried to reach pt tonight though no answer or machine to lmom. Called to go over recommendations per Norma FredricksonLori Gerhardt, NP. Recommendations are for pt to continue on Toprol 25 mg BID as well as she is in agreement with the restart of Mestinon, monitor BP sitting and standing and to bring BP readings to her appt with Tereso NewcomerScott Weaver, PA 03/05/17.

## 2017-03-02 NOTE — Telephone Encounter (Signed)
Reviewed for Tereso NewcomerScott Weaver, PA  Would stay on the BID dosing of Toprol. Agree with recommendations to restart Mestinon and monitor BP sitting and standing and bring in for review. Needs to keep her follow up with Scott.

## 2017-03-03 ENCOUNTER — Telehealth: Payer: Self-pay | Admitting: Internal Medicine

## 2017-03-03 NOTE — Telephone Encounter (Signed)
Patient called to report that she took the medicines like I advised --Toprol in the am and has taken mestinon as prescribed.    Her blood pressure sitting down was 194/75, she stood and after 2 min the BP was 109/52.  I advised to continue with this same medication schedule tomorrow and check BP the same way and bring all readings to appointment on Friday.  She is in agreement.

## 2017-03-03 NOTE — Telephone Encounter (Signed)
Follow Up:   Pt said she talked tou you yesterday,she says she need to ask you another question please.

## 2017-03-05 ENCOUNTER — Ambulatory Visit (INDEPENDENT_AMBULATORY_CARE_PROVIDER_SITE_OTHER): Payer: Medicare HMO | Admitting: Physician Assistant

## 2017-03-05 ENCOUNTER — Encounter: Payer: Self-pay | Admitting: Physician Assistant

## 2017-03-05 VITALS — Ht 63.0 in | Wt 113.0 lb

## 2017-03-05 DIAGNOSIS — I10 Essential (primary) hypertension: Secondary | ICD-10-CM

## 2017-03-05 DIAGNOSIS — F17200 Nicotine dependence, unspecified, uncomplicated: Secondary | ICD-10-CM | POA: Diagnosis not present

## 2017-03-05 DIAGNOSIS — I951 Orthostatic hypotension: Secondary | ICD-10-CM | POA: Diagnosis not present

## 2017-03-05 DIAGNOSIS — I471 Supraventricular tachycardia: Secondary | ICD-10-CM

## 2017-03-05 MED ORDER — LOSARTAN POTASSIUM 25 MG PO TABS: 25.0000 mg | ORAL_TABLET | Freq: Every day | ORAL | 3 refills | Status: DC

## 2017-03-05 NOTE — Patient Instructions (Signed)
Medication Instructions:  Your physician has recommended you make the following change in your medication:  1. Decrease toprol (25 mg ) daily. 2. Start Losartan ( 25 mg ) daily. Sent in today to patient's requested pharmacy.    Labwork: Your physician recommends that you return for lab work on July 26 between 8-5.   Testing/Procedures: -None  Follow-Up: Your physician recommends that you keep your scheduled  follow-up appointment with Dr. Tenny Crawoss.    Any Other Special Instructions Will Be Listed Below (If Applicable).     If you need a refill on your cardiac medications before your next appointment, please call your pharmacy.

## 2017-03-05 NOTE — Progress Notes (Addendum)
Cardiology Office Note:    Date:  03/05/2017   ID:  Barbara Hill, DOB 11/07/1940, MRN [id number]  PCP:  Richmond Campbell., PA-C  Cardiologist:  Dr. Dietrich Pates    Referring MD: Richmond Campbell., PA-C   Chief Complaint  Patient presents with  . Follow-up    Orthostatic hypotension    History of Present Illness:    Barbara Hill is a 76 y.o. female with a hx of SVT, palpitations, dizziness, HTN, rthostatic hypotension. She has been on flecainide for years (started by previous cardiologist - Dr. Alanda Amass). 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  She has been seen recently with symptomatic orthostatic hypotension.  Her Amlodipine and Lisinopril were stopped.  She was last seen in 6/18.  She was still having symptomatic orthostatic hypotension.  Dr. Tenny Craw and I put her on Pyridostigmine to help attenuate her BP drop with standing.   [name] returns for follow-up with her significant other [employer] [facility]).  She denies any further dizziness. She denies chest pain or shortness of breath. She does have occasional palpitations. These are fairly brief but seem to be more frequent recently. She denies syncope. She continues to smoke.  Prior CV studies:   The following studies were reviewed today:  Echo 04/11/16 Mild LVH, EF 60-65, normal wall motion, grade 2 diastolic dysfunction, mild AI, mild MR, mild LAE   Myoview 10/12 Normal stress nuclear study.   LHC 10/02 Normal coronary arteries   Echo 11/01 EF 60%, mild anterior MVP with trivial MR, PASP 35-40 mmHg, LAE  Past Medical History:  Diagnosis Date  . Anxiety   . Brain aneurysm    pipeline stent 2017   . COPD (chronic obstructive pulmonary disease) (HCC)   . Fatigue   . H/O: hysterectomy   . Headache(784.0)   . History of cardiac catheterization    LHC 10/02: Normal  coronary arteries  . History of nuclear stress test    Myoview 10/12: Normal stress nuclear study.  . Hypertension   . Mitral valve prolapse    Echo 8/17:Mild LVH, EF 60-65, normal wall motion, grade 2 diastolic dysfunction, mild AI, mild MR, mild LAE // echo 11/01: EF 60%, mild anterior MVP with trivial MR, PASP 35-40 mmHg, LAE  . Orthostasis   . PSVT (paroxysmal supraventricular tachycardia) (HCC)    palps  . Radiculopathy     Past Surgical History:  Procedure Laterality Date  . ABDOMINAL HYSTERECTOMY    . APPENDECTOMY    . CARDIAC CATHETERIZATION  12/95  . CHOLECYSTECTOMY    . IR GENERIC HISTORICAL  06/02/2016   IR RADIOLOGIST EVAL & MGMT 06/02/2016 [facility]  . IR GENERIC HISTORICAL  06/29/2016   IR ANGIO INTRA EXTRACRAN SEL INTERNAL CAROTID UNI R MOD SED 06/29/2016 [name], MD [facility]  . IR GENERIC HISTORICAL  06/29/2016   IR ANGIOGRAM FOLLOW UP STUDY 06/29/2016 [name], MD [facility]  . IR GENERIC HISTORICAL  06/29/2016   IR NEURO EACH ADD'L AFTER BASIC UNI RIGHT (MS) 06/29/2016 [name], MD [facility]  . IR GENERIC HISTORICAL  06/29/2016   IR 3D INDEPENDENT WKST 06/29/2016 [name], MD [facility]  . IR GENERIC HISTORICAL  06/29/2016   IR TRANSCATH/EMBOLIZ 06/29/2016 [name], MD [facility]  . IR GENERIC HISTORICAL  06/29/2016   IR ANGIO VERTEBRAL SEL SUBCLAVIAN INNOMINATE  UNI R MOD SED 06/29/2016 [name], MD [facility]  . IR GENERIC HISTORICAL  07/28/2016   IR RADIOLOGIST EVAL & MGMT 07/28/2016 [facility]  . IR GENERIC HISTORICAL  11/12/2016   IR ANGIO INTRA EXTRACRAN SEL COM CAROTID INNOMINATE BILAT MOD SED 11/12/2016 [name], MD [facility]  . IR GENERIC HISTORICAL  11/12/2016   IR ANGIO VERTEBRAL SEL VERTEBRAL UNI L MOD SED 11/12/2016 [name], MD [facility]  . IR GENERIC HISTORICAL  11/12/2016   IR ANGIO VERTEBRAL SEL SUBCLAVIAN INNOMINATE UNI R MOD SED 11/12/2016 [name], MD [facility]  . NASAL SEPTUM SURGERY    . RADIOLOGY WITH ANESTHESIA N/A 06/29/2016   Procedure: EMBOLIZATION;  Surgeon: [name], MD;  Location: [facility];  Service: Radiology;  Laterality: N/A;  . ROTATOR CUFF REPAIR      Current Medications: Current Meds  Medication Sig  . acetaminophen (TYLENOL) 650 MG CR tablet Take 1,300 mg by mouth every 8 (eight) hours as needed for pain.  Marland Kitchen acidophilus (RISAQUAD) CAPS capsule Take 1 capsule by mouth daily.  Marland Kitchen ALPRAZolam (XANAX) 0.5 MG tablet Take 0.5 mg by mouth 3 (three) times daily.   Marland Kitchen aspirin EC 81 MG tablet Take 81 mg by mouth daily.  . butalbital-acetaminophen-caffeine (FIORICET, ESGIC) 50-325-40 MG per tablet Take 1-2 tablets by mouth every 4 (four) hours as needed for headache or migraine.   . cholecalciferol (VITAMIN D) 1000 units tablet Take 1,000 Units by mouth daily.  . clopidogrel (PLAVIX) 75 MG tablet Take 75 mg by mouth daily.  . fexofenadine-pseudoephedrine (ALLEGRA-D) 60-120 MG 12 hr tablet Take 1 tablet by mouth daily as needed (allergies).   . flecainide (TAMBOCOR) 100 MG tablet Take 1 tablet (100 mg total) by mouth 2 (two) times daily.  Marland Kitchen FLUoxetine (PROZAC) 20 MG capsule Take 20 mg by mouth 2 (two) times daily.  . fluticasone (FLONASE) 50 MCG/ACT nasal spray Place 1 spray into both nostrils daily as needed for allergies or rhinitis.   Marland Kitchen lactose free nutrition (BOOST) LIQD Take 237 mLs by mouth daily as needed (nutrional supplement).   . metoprolol succinate (TOPROL-XL) 25 MG 24 hr tablet Take 25 mg by mouth daily.  Marland Kitchen omeprazole (PRILOSEC) 40 MG capsule Take 40 mg by mouth daily.    . potassium chloride SA (K-DUR,KLOR-CON) 20 MEQ tablet Take 1 tablet by mouth daily.  Marland Kitchen pyridostigmine (MESTINON) 60 MG tablet Take 0.5 tablets (30 mg total) by mouth 3 (three) times daily.     Allergies:   Dobutamine; Epinephrine; Darvocet [propoxyphene n-acetaminophen]; Excedrin extra strength [aspirin-acetaminophen-caffeine];  Clarithromycin; Doxycycline; Penicillins; Prednisone; and Propoxyphene   Social History   Social History  . Marital status: Widowed    Spouse name: N/A  . Number of children: 2  . Years of education: 12   Occupational History  . Retired    Social History Main Topics  . Smoking status: Current Every Day Smoker    Packs/day: 1.00    Years: 50.00  . Smokeless tobacco: Current User  . Alcohol use No  . Drug use: No  . Sexual activity: Not Asked   Other Topics Concern  . None   Social History Narrative   Lives with significant other, [name]    Caffeine use: none     Family Hx: The patient's family history includes Asthma in her other; CAD in her unknown relative; Heart Problems in her brother; Heart attack in her mother and other; Heart attack (age of onset: 62) in  her father; Heart failure in her other; Osteoporosis in her other; Transient ischemic attack in her mother.  ROS:   Please see the history of present illness.    Review of Systems  Cardiovascular: Positive for irregular heartbeat.  Musculoskeletal: Positive for back pain.  Gastrointestinal: Positive for constipation.  Neurological: Positive for headaches.   All other systems reviewed and are negative.   EKGs/Labs/Other Test Reviewed:    EKG:  EKG is  ordered today.  The ekg ordered today demonstrates Sinus bradycardia, HR 50, left axis deviation, IVCD, QTc 486  Recent Labs: 03/31/2016: Brain Natriuretic Peptide 439.9; TSH 1.07 04/11/2016: Magnesium 1.3 06/22/2016: ALT 12 11/12/2016: BUN 8; Creatinine, Ser 1.00; Hemoglobin 11.9; Platelets 319; Potassium 4.6; Sodium 135   Recent Lipid Panel Lab Results  Component Value Date/Time   CHOL 188 07/24/2016 11:25 AM   TRIG 120 07/24/2016 11:25 AM   HDL 65 07/24/2016 11:25 AM   CHOLHDL 2.9 07/24/2016 11:25 AM   LDLCALC 99 07/24/2016 11:25 AM    Physical Exam:    VS:  Ht 5\' 3"  (1.6 m)   Wt 113 lb (51.3 kg)   BMI 20.02 kg/m    Orthostatic VS for the past 24  hrs (Last 3 readings):  BP- Lying Pulse- Lying BP- Sitting Pulse- Sitting BP- Standing at 0 minutes Pulse- Standing at 0 minutes BP- Standing at 3 minutes Pulse- Standing at 3 minutes  03/05/17 1050 167/78 50 186/79 (!) 49 172/71 (!) 49 159/71 (!) 49     Wt Readings from Last 3 Encounters:  03/05/17 113 lb (51.3 kg)  02/12/17 110 lb 12.8 oz (50.3 kg)  12/02/16 113 lb (51.3 kg)     Physical Exam  Constitutional: She is oriented to person, place, and time. She appears well-developed and well-nourished. No distress.  HENT:  Head: Normocephalic and atraumatic.  Eyes: No scleral icterus.  Neck: No JVD present.  Cardiovascular: Normal rate, regular rhythm and normal heart sounds.   No murmur heard. Pulmonary/Chest: She has no wheezes. She has no rales.  Abdominal: There is no tenderness.  Musculoskeletal: She exhibits no edema (compression stockings in place).  Neurological: She is alert and oriented to person, place, and time.  Skin: Skin is warm and dry.  Psychiatric: She has a normal mood and affect.    ASSESSMENT:    1. Orthostatic hypotension   2. Essential hypertension   3. Paroxysmal SVT (supraventricular tachycardia) (HCC)   4. Tobacco use disorder    PLAN:    In order of problems listed above:  1. Orthostatic hypotension Improved. She does not have significant blood pressure drop from lying to standing today on orthostatic vital signs. Continue pyridostigmine, compression stockings.   2. Essential hypertension -  Her blood pressure is now uncontrolled. She does admit to dietary indiscretion with salt.  I have asked her to cut back on her salt.  I d/w Dr. Tenny Craw today. We adjust her BP regimen to keep her HTN controlled.   -  Start Losartan 25 mg QD  -  BMET in 2 weeks.   3. Paroxysmal SVT (supraventricular tachycardia) (HCC) -  She has had some symptoms of palpitations recently.  But, these were not sustained.  She is bradycardic which is likely worsened by the  Pyridostigmine.  I will decrease her Toprol to 25 mg QD b/c of her bradycardia.    4. Tobacco use disorder - I again recommended cessation.   Dispo:  Return in about 4 weeks (around 04/02/2017) for  Close Follow Up, w/ Dr. Tenny Crawoss, or Tereso NewcomerScott Bora Broner, PA-C.   Medication Adjustments/Labs and Tests Ordered: Current medicines are reviewed at length with the patient today.  Concerns regarding medicines are outlined above.  Tests Ordered: Orders Placed This Encounter  Procedures  . Basic Metabolic Panel (BMET)  . EKG 12-Lead   Medication Changes: Meds ordered this encounter  Medications  . losartan (COZAAR) 25 MG tablet    Sig: Take 1 tablet (25 mg total) by mouth daily.    Dispense:  30 tablet    Refill:  3    Signed, Tereso NewcomerScott Delmer Kowalski, PA-C  03/05/2017 1:25 PM    [facility] [address], [place], [place]  [address] Phone: [phone]; Fax: [phone]

## 2017-03-07 IMAGING — MR MR MRA HEAD W/O CM
4 series · 27 of 48 positions shown · IV contrast (multihance)
Comparison: MRI 04/10/2016

CLINICAL DATA: Recent fall with head trauma. Incidental MCA
aneurysm detected.

EXAM:
MRA NECK WITHOUT AND WITH CONTRAST
MRA HEAD WITHOUT CONTRAST
TECHNIQUE: Multiplanar and multiecho pulse sequences of the neck were obtained
without and with intravenous contrast. Angiographic images of the
neck were obtained using MRA technique without and with intravenous
contast.; Angiographic images of the Circle of Willis were obtained
using MRA technique without intravenous contrast.
CONTRAST:  10mL MULTIHANCE GADOBENATE DIMEGLUMINE 529 MG/ML IV SOLN

[Series 3: tof_3d_multi-slab · axial · 0.7mm · 0.35mm/px · z∈[-44,+39]mm · 9 of 136 slices shown]
[im 8/136]
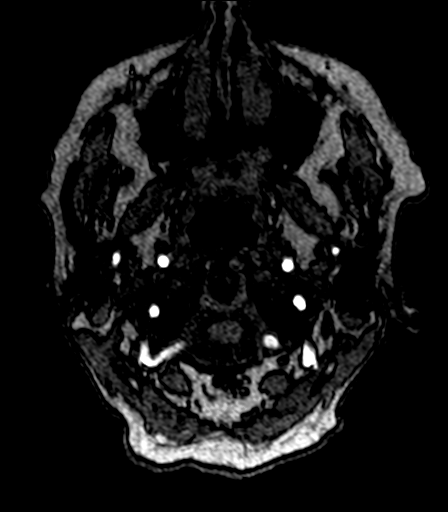
[im 23/136]
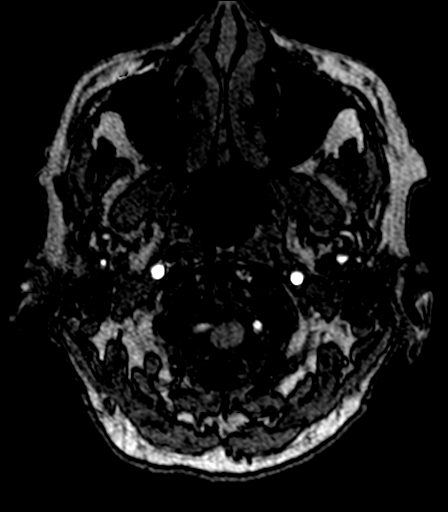
[im 38/136]
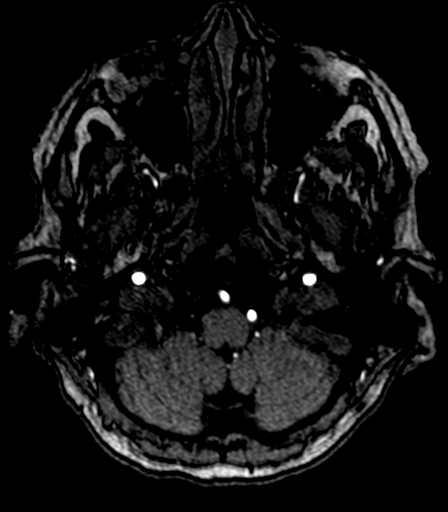
[im 61/136]
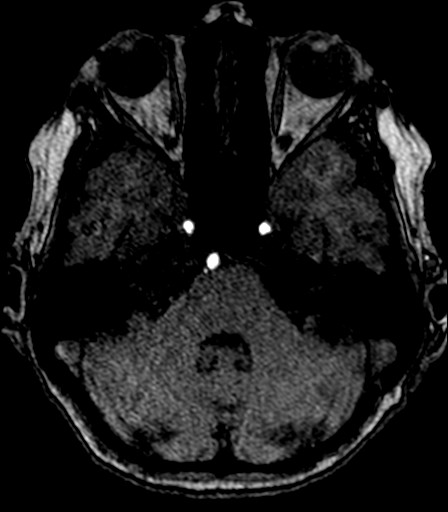
[im 68/136]
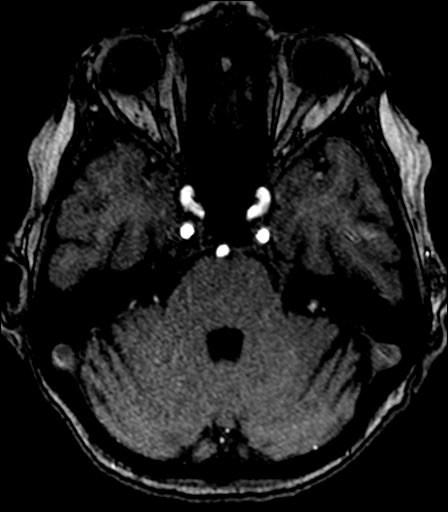
[im 76/136]
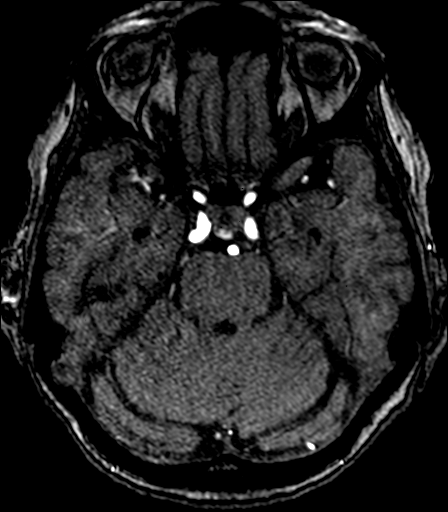
[im 98/136]
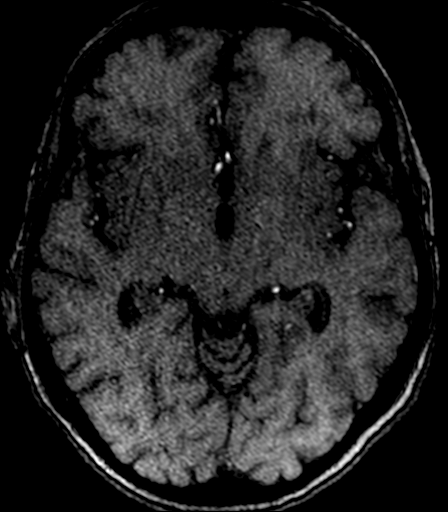
[im 113/136]
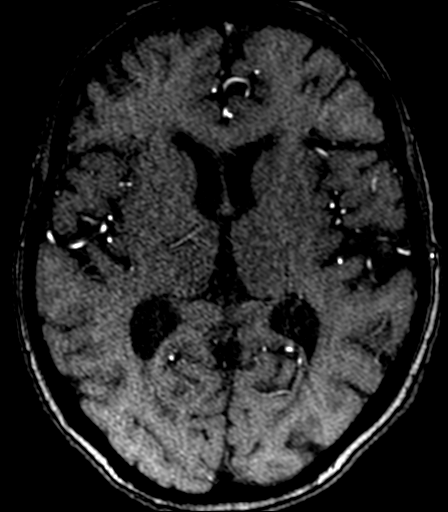
[im 128/136]
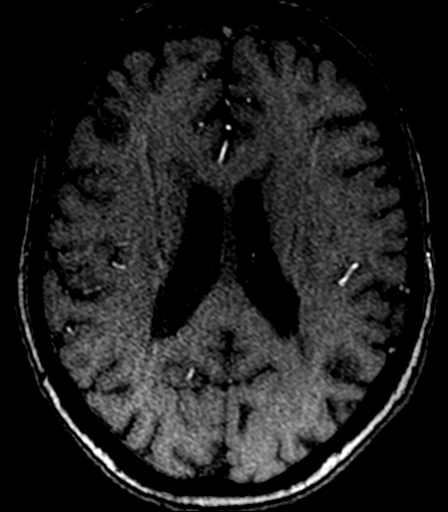

[Series 10: fl_tof_2d · axial · 3.0mm · 0.39mm/px · z∈[-142,-43]mm · 7 of 50 slices shown]
[im 1/50]
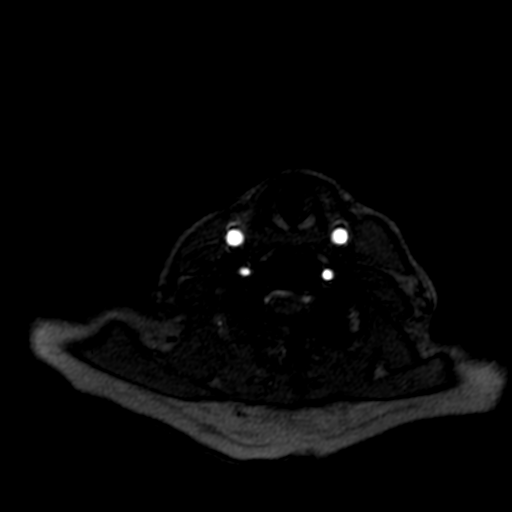
[im 9/50]
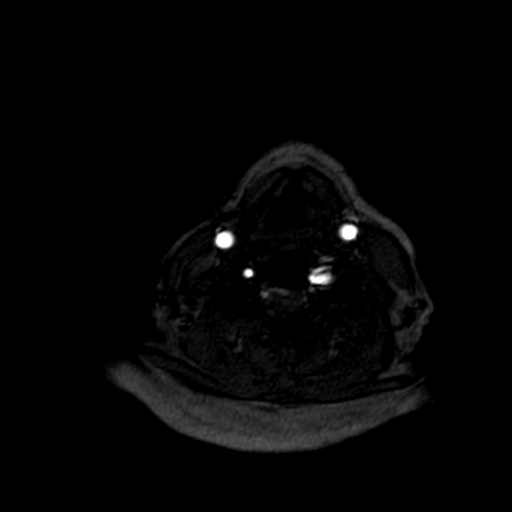
[im 17/50]
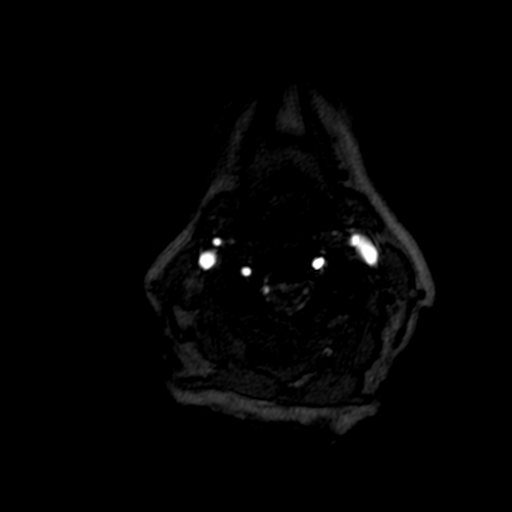
[im 25/50]
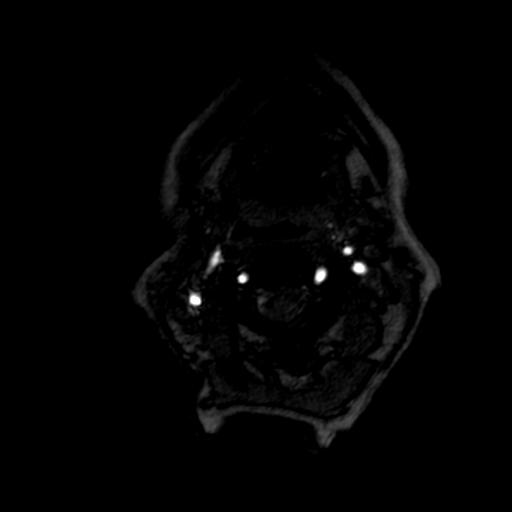
[im 33/50]
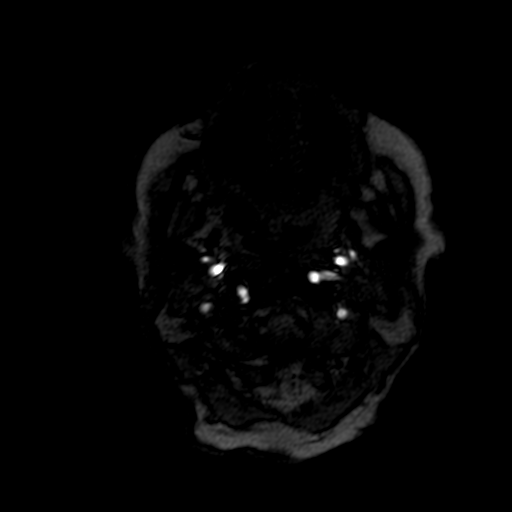
[im 41/50]
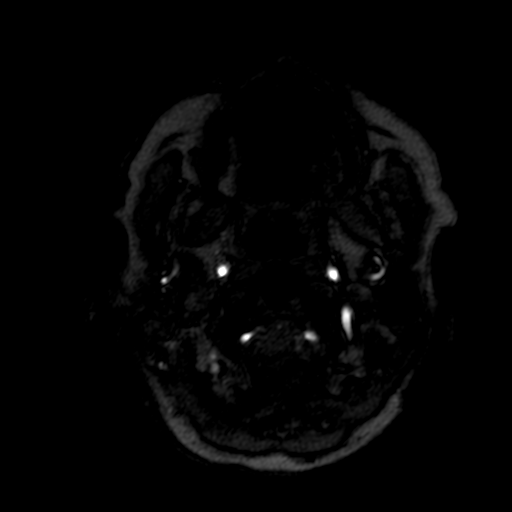
[im 50/50]
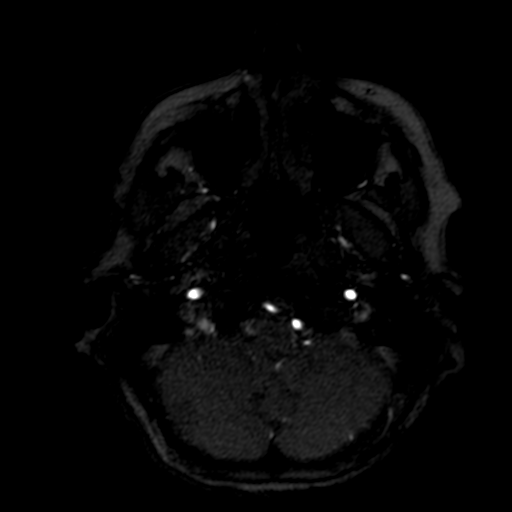

[Series 16: (id)_tt=1.0s · coronal · 0.8mm · 0.78mm/px · 8 of 79 slices shown]
[im 1/79]
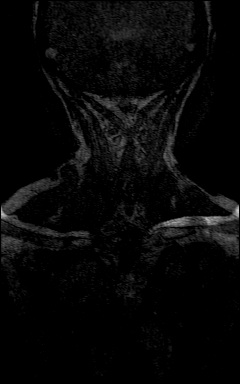
[im 8/79]
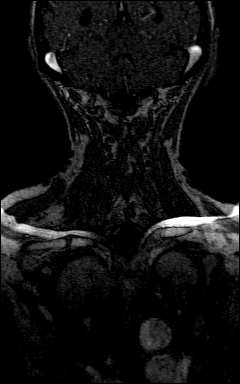
[im 16/79]
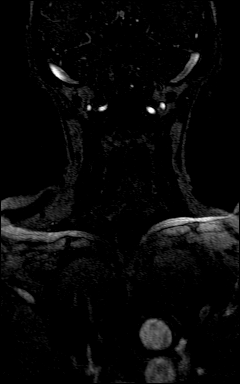
[im 24/79]
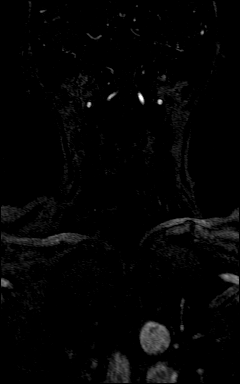
[im 32/79]
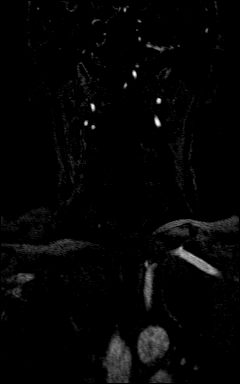
[im 40/79]
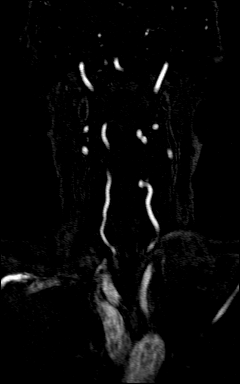
[im 47/79]
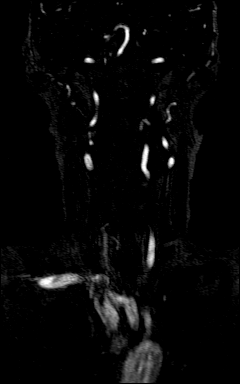
[im 71/79]
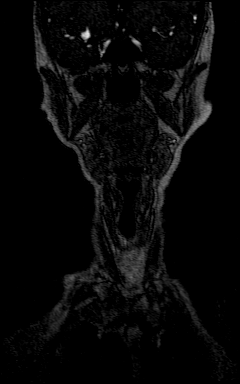

[Series 17: (id)_tt=1.0s_sub · coronal · 0.8mm · 0.78mm/px · 3 of 82 slices shown]
[im 9/82]
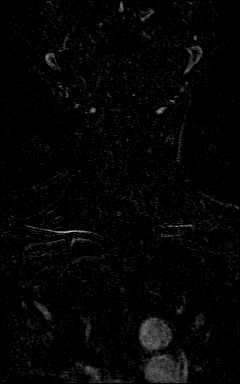
[im 41/82]
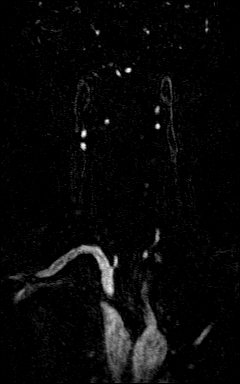
[im 73/82]
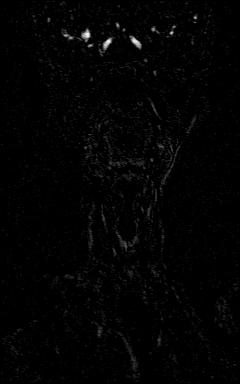

[27 of 48 positions shown; findings below may reference images not displayed]

FINDINGS: MRA NECK FINDINGS

Branching pattern of the brachiocephalic vessels from the arch is
normal. No origin stenoses. Both common carotid arteries are widely
patent to the bifurcation. No carotid bifurcation atherosclerotic
disease. No stenosis or irregularity. Both cervical internal carotid
arteries are normal.

Both vertebral arteries are patent, but show some atherosclerotic
disease. 30-50% stenosis of both vertebral artery origins and mild
atherosclerotic irregularity of the proximal vertebral arteries.
Both vertebral arteries are patent through the neck to the basilar.

MRA HEAD FINDINGS

Both internal carotid arteries are widely patent through the
skullbase. The anterior and middle cerebral vessels are patent
bilaterally without proximal stenosis. There is an aneurysm at the
right MCA bifurcation measuring up to 8 mm in diameter. No other
anterior circulation aneurysm.

Both vertebral arteries are patent to the basilar. No basilar
stenosis. Posterior circulation branch vessels are patent. No
posterior circulation aneurysm.
IMPRESSION: Study confirms the presence of an 8 mm aneurysm at the right MCA
bifurcation. No other intracranial aneurysm.

No carotid bifurcation atherosclerotic disease.

Atherosclerotic change affecting the proximal basilar arteries with
maximal stenosis 30-50%.

## 2017-03-09 NOTE — Telephone Encounter (Signed)
Pt saw Tereso NewcomerScott Weaver, [place] 03/05/17. PA decreased Toprol to 25 mg daily and started Losartan 25 mg daily, with BMET to be done on 7/26. Pt was advised to keep her f/u with DR. Ross as already scheduled.

## 2017-03-10 ENCOUNTER — Telehealth: Payer: Self-pay | Admitting: Internal Medicine

## 2017-03-10 ENCOUNTER — Encounter: Payer: Self-pay | Admitting: Internal Medicine

## 2017-03-10 DIAGNOSIS — I471 Supraventricular tachycardia: Secondary | ICD-10-CM

## 2017-03-10 DIAGNOSIS — R002 Palpitations: Secondary | ICD-10-CM

## 2017-03-10 NOTE — Telephone Encounter (Signed)
Patient c/o palpitations that were worse when she got up this morning but now has calmed down. Patient said that she took her medications and the palpitations improved. Patient said after she took 1/2 tablet of her xanax, the palpitations stopped. Palpitations are better now than when she came in to be seen on 03/05/17. No c/o dizziness, chest pain or sob. Patient is asking if she needed to wait until August to be seen for her palpitations. Nurse advised patient that since her symptoms had improved that she didn't need a sooner appointment. Patient encouraged to take her medications on time daily and that she should use her xanax as prescribed. Patient encouraged to refrain from caffeine products, reduce any stressors and stop smoking. Patient informed that this message would be sent to her provider for advice and if he felt she needed an earlier appointment that she would be contacted. Patient verbalized understanding of plan.

## 2017-03-10 NOTE — Telephone Encounter (Signed)
New message   Patient c/o Palpitations:  High priority if patient c/o lightheadedness and shortness of breath.  1. How long have you been having palpitations? About a week  2. Are you currently experiencing lightheadedness and shortness of breath? no  3. Have you checked your BP and heart rate? (document readings) no  4. Are you experiencing any other symptoms? No but pt can feel heart jumping

## 2017-03-10 NOTE — Telephone Encounter (Signed)
Let's have her get a 48 Hour Holter. Tereso NewcomerScott Khrista Braun, PA-C    03/10/2017 3:31 PM

## 2017-03-10 NOTE — Telephone Encounter (Signed)
[name] (significant other) was informed and verbalized understanding of plan.

## 2017-03-16 ENCOUNTER — Other Ambulatory Visit: Payer: Self-pay | Admitting: Physician Assistant

## 2017-03-16 MED ORDER — METOPROLOL SUCCINATE ER 25 MG PO TB24: 25.0000 mg | ORAL_TABLET | Freq: Every day | ORAL | 11 refills | Status: DC

## 2017-03-18 ENCOUNTER — Telehealth: Payer: Self-pay | Admitting: Physician Assistant

## 2017-03-18 ENCOUNTER — Other Ambulatory Visit: Payer: Medicare HMO | Admitting: *Deleted

## 2017-03-18 ENCOUNTER — Telehealth: Payer: Self-pay | Admitting: *Deleted

## 2017-03-18 DIAGNOSIS — I10 Essential (primary) hypertension: Secondary | ICD-10-CM

## 2017-03-18 NOTE — Telephone Encounter (Signed)
Pt here for lab while here c/o heart skipping around has not taken metoprolol for couple of days thought was not suppose to take .  Reviewed last office note appears metoprolol was decreased to  25 mg and start losartan 25 mg pt aware scripts are available for pick up at pharmacy heart rate was 72. Instructed pt to take med this is why heart feels irreg pt agrees and will take med on way home [place]

## 2017-03-18 NOTE — Telephone Encounter (Signed)
Called pt to inform her per LOV 03/05/17, with Tereso NewcomerScott Weaver, PA, that pt's Metoprolol was decrease to pt taking 1 tablet daily and that her medication was at her pharmacy, ready to be picked up and if she has any other problems, questions or concerns to call the office. Pt verbalized understanding.

## 2017-03-19 ENCOUNTER — Other Ambulatory Visit: Payer: Self-pay | Admitting: *Deleted

## 2017-03-19 DIAGNOSIS — E876 Hypokalemia: Secondary | ICD-10-CM

## 2017-03-19 LAB — BASIC METABOLIC PANEL
BUN/Creatinine Ratio: 9 — ABNORMAL LOW (ref 12–28)
BUN: 8 mg/dL (ref 8–27)
CALCIUM: 8.7 mg/dL (ref 8.7–10.3)
CHLORIDE: 97 mmol/L (ref 96–106)
CO2: 30 mmol/L — AB (ref 20–29)
Creatinine, Ser: 0.86 mg/dL (ref 0.57–1.00)
GFR calc Af Amer: 76 mL/min/{1.73_m2} (ref >59)
GFR calc non Af Amer: 66 mL/min/{1.73_m2} (ref >59)
GLUCOSE: 96 mg/dL (ref 65–99)
POTASSIUM: 2.6 mmol/L — AB (ref 3.5–5.2)
Sodium: 142 mmol/L (ref 134–144)

## 2017-03-19 MED ORDER — POTASSIUM CHLORIDE CRYS ER 20 MEQ PO TBCR: EXTENDED_RELEASE_TABLET | ORAL | 11 refills | Status: DC

## 2017-03-19 NOTE — Telephone Encounter (Signed)
Agree If palpitations continue, she should call.  We will need to have her wear a Holter monitor at that point. Tereso NewcomerScott Tashai Catino, PA-C    03/19/2017 8:29 AM

## 2017-03-19 NOTE — Telephone Encounter (Signed)
Spoke with pt feels much better today has appt for 48 hour holter on Thursday 03-25-17 ./cy

## 2017-04-12 ENCOUNTER — Other Ambulatory Visit: Payer: Self-pay | Admitting: Physician Assistant

## 2017-04-12 DIAGNOSIS — I471 Supraventricular tachycardia: Secondary | ICD-10-CM

## 2017-04-16 ENCOUNTER — Ambulatory Visit (INDEPENDENT_AMBULATORY_CARE_PROVIDER_SITE_OTHER): Payer: Medicare HMO

## 2017-04-16 ENCOUNTER — Ambulatory Visit: Payer: Self-pay | Admitting: Internal Medicine

## 2017-04-16 DIAGNOSIS — I471 Supraventricular tachycardia: Secondary | ICD-10-CM | POA: Diagnosis not present

## 2017-04-16 DIAGNOSIS — R002 Palpitations: Secondary | ICD-10-CM

## 2017-05-03 ENCOUNTER — Ambulatory Visit (INDEPENDENT_AMBULATORY_CARE_PROVIDER_SITE_OTHER): Payer: Medicare HMO | Admitting: Internal Medicine

## 2017-05-03 ENCOUNTER — Encounter: Payer: Self-pay | Admitting: Internal Medicine

## 2017-05-03 VITALS — BP 152/84 | HR 63 | Ht 63.0 in | Wt 108.0 lb

## 2017-05-03 DIAGNOSIS — I10 Essential (primary) hypertension: Secondary | ICD-10-CM

## 2017-05-03 DIAGNOSIS — R002 Palpitations: Secondary | ICD-10-CM | POA: Diagnosis not present

## 2017-05-03 DIAGNOSIS — I951 Orthostatic hypotension: Secondary | ICD-10-CM

## 2017-05-03 DIAGNOSIS — I471 Supraventricular tachycardia: Secondary | ICD-10-CM

## 2017-05-03 NOTE — Progress Notes (Signed)
Cardiology Office Note   Date:  05/03/2017   ID:  Barbara Hill, DOB Nov 08, 1940, MRN [id number]  PCP:  Richmond Campbell., PA-C  Cardiologist:   Dietrich Pates, MD   F/U of orthostasis and HTN    History of Present Illness: Barbara Hill is a 76 y.o. female with a history of orthostatic intolerance and hypertension, SVT, palpitations.  Syncopal spell in 2017  I asw her in March  Since the she has been seen by Wende Mott several times   She was lasat seen on 7/13  Losartan started  (25 mg)  Since seen she says that she has been doing pretty good  Has some dizzines but not bad  No syncope   BPs at home in 150s       Current Meds  Medication Sig  . acetaminophen (TYLENOL) 650 MG CR tablet Take 1,300 mg by mouth every 8 (eight) hours as needed for pain.  Marland Kitchen acidophilus (RISAQUAD) CAPS capsule Take 1 capsule by mouth daily.  Marland Kitchen ALPRAZolam (XANAX) 0.5 MG tablet Take 0.5 mg by mouth 3 (three) times daily.   Marland Kitchen aspirin EC 81 MG tablet Take 81 mg by mouth daily.  . butalbital-acetaminophen-caffeine (FIORICET, ESGIC) 50-325-40 MG per tablet Take 1-2 tablets by mouth every 4 (four) hours as needed for headache or migraine.   . cholecalciferol (VITAMIN D) 1000 units tablet Take 1,000 Units by mouth daily.  . clopidogrel (PLAVIX) 75 MG tablet Take 75 mg by mouth daily.  . fexofenadine-pseudoephedrine (ALLEGRA-D) 60-120 MG 12 hr tablet Take 1 tablet by mouth daily as needed (allergies).   . flecainide (TAMBOCOR) 100 MG tablet TAKE 1 TABLET BY MOUTH TWICE A DAY  . FLUoxetine (PROZAC) 20 MG capsule Take 20 mg by mouth 2 (two) times daily.  . fluticasone (FLONASE) 50 MCG/ACT nasal spray Place 1 spray into both nostrils daily as needed for allergies or rhinitis.   Marland Kitchen lactose free nutrition (BOOST) LIQD Take 237 mLs by mouth daily as needed (nutrional supplement).   . losartan (COZAAR) 25 MG tablet Take 1 tablet (25 mg total) by mouth daily.  . metoprolol succinate (TOPROL-XL) 25 MG 24 hr tablet Take 1  tablet (25 mg total) by mouth daily.  Marland Kitchen omeprazole (PRILOSEC) 40 MG capsule Take 40 mg by mouth daily.    . potassium chloride SA (K-DUR,KLOR-CON) 20 MEQ tablet Take  60 meq today and then 40 meq there after  . pyridostigmine (MESTINON) 60 MG tablet Take 0.5 tablets (30 mg total) by mouth 3 (three) times daily.     Allergies:   Dobutamine; Epinephrine; Darvocet [propoxyphene n-acetaminophen]; Excedrin extra strength [aspirin-acetaminophen-caffeine]; Clarithromycin; Doxycycline; Penicillins; Prednisone; and Propoxyphene   Past Medical History:  Diagnosis Date  . Anxiety   . Brain aneurysm    pipeline stent 2017   . COPD (chronic obstructive pulmonary disease) (HCC)   . Fatigue   . H/O: hysterectomy   . Headache(784.0)   . History of cardiac catheterization    LHC 10/02: Normal coronary arteries  . History of nuclear stress test    Myoview 10/12: Normal stress nuclear study.  . Hypertension   . Mitral valve prolapse    Echo 8/17:Mild LVH, EF 60-65, normal wall motion, grade 2 diastolic dysfunction, mild AI, mild MR, mild LAE // echo 11/01: EF 60%, mild anterior MVP with trivial MR, PASP 35-40 mmHg, LAE  . Orthostasis   . PSVT (paroxysmal supraventricular tachycardia) (HCC)    palps  . Radiculopathy  Past Surgical History:  Procedure Laterality Date  . ABDOMINAL HYSTERECTOMY    . APPENDECTOMY    . CARDIAC CATHETERIZATION  12/95  . CHOLECYSTECTOMY    . IR GENERIC HISTORICAL  06/02/2016   IR RADIOLOGIST EVAL & MGMT 06/02/2016 [facility]  . IR GENERIC HISTORICAL  06/29/2016   IR ANGIO INTRA EXTRACRAN SEL INTERNAL CAROTID UNI R MOD SED 06/29/2016 Julieanne CottonSanjeev Deveshwar, MD [facility]  . IR GENERIC HISTORICAL  06/29/2016   IR ANGIOGRAM FOLLOW UP STUDY 06/29/2016 Julieanne CottonSanjeev Deveshwar, MD [facility]  . IR GENERIC HISTORICAL  06/29/2016   IR NEURO EACH ADD'L AFTER BASIC UNI RIGHT (MS) 06/29/2016 Julieanne CottonSanjeev Deveshwar, MD [facility]  . IR GENERIC HISTORICAL  06/29/2016   IR 3D  INDEPENDENT WKST 06/29/2016 Julieanne CottonSanjeev Deveshwar, MD [facility]  . IR GENERIC HISTORICAL  06/29/2016   IR TRANSCATH/EMBOLIZ 06/29/2016 Julieanne CottonSanjeev Deveshwar, MD [facility]  . IR GENERIC HISTORICAL  06/29/2016   IR ANGIO VERTEBRAL SEL SUBCLAVIAN INNOMINATE UNI R MOD SED 06/29/2016 Julieanne CottonSanjeev Deveshwar, MD [facility]  . IR GENERIC HISTORICAL  07/28/2016   IR RADIOLOGIST EVAL & MGMT 07/28/2016 [facility]  . IR GENERIC HISTORICAL  11/12/2016   IR ANGIO INTRA EXTRACRAN SEL COM CAROTID INNOMINATE BILAT MOD SED 11/12/2016 Julieanne CottonSanjeev Deveshwar, MD [facility]  . IR GENERIC HISTORICAL  11/12/2016   IR ANGIO VERTEBRAL SEL VERTEBRAL UNI L MOD SED 11/12/2016 Julieanne CottonSanjeev Deveshwar, MD [facility]  . IR GENERIC HISTORICAL  11/12/2016   IR ANGIO VERTEBRAL SEL SUBCLAVIAN INNOMINATE UNI R MOD SED 11/12/2016 Julieanne CottonSanjeev Deveshwar, MD [facility]  . NASAL SEPTUM SURGERY    . RADIOLOGY WITH ANESTHESIA N/A 06/29/2016   Procedure: EMBOLIZATION;  Surgeon: Julieanne CottonSanjeev Deveshwar, MD;  Location: [facility];  Service: Radiology;  Laterality: N/A;  . ROTATOR CUFF REPAIR       Social History:  The patient  reports that she has been smoking.  She has a 50.00 pack-year smoking history. She uses smokeless tobacco. She reports that she does not drink alcohol or use drugs.   Family History:  The patient's family history includes Asthma in her other; CAD in her unknown relative; Heart Problems in her brother; Heart attack in her mother and other; Heart attack (age of onset: 6780) in her father; Heart failure in her other; Osteoporosis in her other; Transient ischemic attack in her mother.    ROS:  Please see the history of present illness. All other systems are reviewed and  Negative to the above problem except as noted.    PHYSICAL EXAM: VS:  BP (!) 152/84   Pulse 63   Ht 5\' 3"  (1.6 m)   Wt 108 lb (49 kg)   SpO2 97%   BMI 19.13 kg/m   GEN: Well nourished, well developed, in no acute distress  HEENT: normal  Neck: no JVD, carotid bruits, or  masses Cardiac: RRR; no murmurs, rubs, or gallops,no edema  Respiratory:  clear to auscultation bilaterally, normal work of breathing GI: soft, nontender, nondistended, + BS  No hepatomegaly  MS: no deformity Moving all extremities   Skin: warm and dry, no rash Neuro:  Strength and sensation are intact Psych: euthymic mood, full affect   EKG:  EKG is not ordered today.   Lipid Panel    Component Value Date/Time   CHOL 188 07/24/2016 1125   TRIG 120 07/24/2016 1125   HDL 65 07/24/2016 1125   CHOLHDL 2.9 07/24/2016 1125   VLDL 24 07/24/2016 1125   LDLCALC 99 07/24/2016  1125      Wt Readings from Last 3 Encounters:  05/03/17 108 lb (49 kg)  03/05/17 113 lb (51.3 kg)  02/12/17 110 lb 12.8 oz (50.3 kg)      ASSESSMENT AND PLAN:  1  HTN  BP is a little high in 150s  But with orthostatic intolerance I would not push lower  Keep on current regimen  Check BMET    2  Orthostatic intolerance  Symptoms improved signif on mestinon and backing down antihypertensives   Continue to follow  She is trying to drink more fluids which has helped  3  Palpitations  The pt has been on flecanide for years  Initiated by Jonette Eva  She says that without it she has many more   Dose is a little high  WIll get trough level and may adust    F/U later this winter   Current medicines are reviewed at length with the patient today.  The patient does not have concerns regarding medicines.  Signed, Dietrich Pates, MD  05/03/2017 9:33 AM    [facility] [address] [place], [place], [place]  [address] Phone: [phone]; Fax: [phone]

## 2017-05-03 NOTE — Patient Instructions (Addendum)
Your physician recommends that you continue on your current medications as directed. Please refer to the Current Medication list given to you today.  Your physician recommends that you return for lab work today (BMET) and flecainide level.  Come around 4 pm for this blood work.  Please call the day before you come in so we can schedule an appointment.  Please bring your holter monitor with you that day so it can be processed.  Your physician wants you to follow-up in: January 2019 with Dr. Tenny Crawoss. You will receive a reminder letter in the mail two months in advance. If you don't receive a letter, please call our office to schedule the follow-up appointment.

## 2017-05-10 ENCOUNTER — Other Ambulatory Visit: Payer: Medicare HMO

## 2017-05-12 ENCOUNTER — Telehealth: Payer: Self-pay | Admitting: *Deleted

## 2017-05-12 NOTE — Telephone Encounter (Signed)
Called to request holter monitor to be returned to our office. Informed patient 48 hour holter was applied on 04/16/17 and due back at our office 04/18/17.  Patient states she was going to return it a [place] appointment, however, he was hospitalized.  Patient does not drive.  Patient has an appointment in [place] on 05/18/17 for a CT scan, which she will need to arrange a ride for.  She stated she will return the monitor to our office on that date.

## 2017-05-18 ENCOUNTER — Ambulatory Visit (HOSPITAL_COMMUNITY): Payer: Medicare HMO

## 2017-05-31 ENCOUNTER — Ambulatory Visit (HOSPITAL_COMMUNITY)
Admission: RE | Admit: 2017-05-31 | Discharge: 2017-05-31 | Disposition: A | Discharge status: Home / Self Care | Payer: Medicare HMO | Source: Ambulatory Visit | Attending: Interventional Radiology | Admitting: Interventional Radiology

## 2017-05-31 DIAGNOSIS — Q2739 Arteriovenous malformation, other site: Secondary | ICD-10-CM | POA: Diagnosis not present

## 2017-05-31 DIAGNOSIS — I771 Stricture of artery: Secondary | ICD-10-CM

## 2017-05-31 DIAGNOSIS — I708 Atherosclerosis of other arteries: Secondary | ICD-10-CM | POA: Diagnosis not present

## 2017-05-31 LAB — POCT I-STAT CREATININE: CREATININE: 0.9 mg/dL (ref 0.44–1.00)

## 2017-05-31 MED ORDER — IOPAMIDOL (ISOVUE-370) INJECTION 76%: 50.0000 mL | Freq: Once | INTRAVENOUS | Status: DC | PRN

## 2017-06-08 ENCOUNTER — Telehealth (HOSPITAL_COMMUNITY): Payer: Self-pay

## 2017-06-08 NOTE — Telephone Encounter (Signed)
Pt agreed to f/u in 6 months with cta head and neck. AW 

## 2017-06-22 ENCOUNTER — Telehealth (HOSPITAL_COMMUNITY): Payer: Self-pay | Admitting: *Deleted

## 2017-06-22 NOTE — Telephone Encounter (Signed)
Per Beckey DowningPam Turpin PA-C called refill in for Plavix 75mg  1 tab daily qty#30.  Last P2y12 06/30/16 166.  Will discuss with Dr. Corliss Skainseveshwar.  LM for pharmacy that included refill and callback #

## 2017-06-28 ENCOUNTER — Other Ambulatory Visit: Payer: Self-pay | Admitting: Physician Assistant

## 2017-06-28 DIAGNOSIS — I10 Essential (primary) hypertension: Secondary | ICD-10-CM

## 2017-07-20 ENCOUNTER — Encounter: Payer: Self-pay | Admitting: Internal Medicine

## 2017-07-27 ENCOUNTER — Ambulatory Visit: Payer: Medicare HMO | Admitting: Physician Assistant

## 2017-07-28 NOTE — Progress Notes (Signed)
Cardiology Office Note    Date:  07/29/2017   ID:  Barbara Hill, DOB 1940/10/06, MRN [id number]  PCP:  Richmond Campbell., PA-C  Cardiologist: Dr. Tenny Craw  Chief Complaint: Palpitations   History of Present Illness:   Barbara Hill is a 76 y.o. female  with a hx of SVT, palpitations, dizziness, HTN, orthostatic hypotension and COPD presents for follow up.   She has been on flecainide for years (started by previous cardiologist - Dr. Alanda Amass). 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   48 hours holter monitor showed sinus rhythm, Avg HR of 60 without significant pause.   Last seen by Dr. Tenny Craw 05/03/17.  Patient had an episode of palpitation about a week ago.  This was her first episode in many years.  Felt like intermittent skipping however, similar to prior SVT episodes.  At that day, patient had at least half a box of chocolate.  No caffeinated drink.  She does have a history of chocolate/caffeine use palpitation.  Currently smokes half a pack of cigarettes per day.  No chest pain, shortness of breath, orthopnea, PND, syncope (recent), dizziness or melena.  No alcohol intake.  Past Medical History:  Diagnosis Date  . Anxiety   . Brain aneurysm    pipeline stent 2017   . COPD (chronic obstructive pulmonary disease) (HCC)   . Fatigue   . H/O: hysterectomy   . Headache(784.0)   . History of cardiac catheterization    LHC 10/02: Normal coronary arteries  . History of nuclear stress test    Myoview 10/12: Normal stress nuclear study.  . Hypertension   . Mitral valve prolapse    Echo 8/17:Mild LVH, EF 60-65, normal wall motion, grade 2 diastolic dysfunction, mild AI, mild MR, mild LAE // echo 11/01: EF 60%, mild anterior MVP with trivial MR, PASP 35-40 mmHg, LAE  . Orthostasis   . PSVT (paroxysmal supraventricular tachycardia)  (HCC)    palps  . Radiculopathy     Past Surgical History:  Procedure Laterality Date  . ABDOMINAL HYSTERECTOMY    . APPENDECTOMY    . CARDIAC CATHETERIZATION  12/95  . CHOLECYSTECTOMY    . IR GENERIC HISTORICAL  06/02/2016   IR RADIOLOGIST EVAL & MGMT 06/02/2016 [facility]  . IR GENERIC HISTORICAL  06/29/2016   IR ANGIO INTRA EXTRACRAN SEL INTERNAL CAROTID UNI R MOD SED 06/29/2016 [name], MD [facility]  . IR GENERIC HISTORICAL  06/29/2016   IR ANGIOGRAM FOLLOW UP STUDY 06/29/2016 [name], MD [facility]  . IR GENERIC HISTORICAL  06/29/2016   IR NEURO EACH ADD'L AFTER BASIC UNI RIGHT (MS) 06/29/2016 [name], MD [facility]  . IR GENERIC HISTORICAL  06/29/2016   IR 3D INDEPENDENT WKST 06/29/2016 [name], MD [facility]  . IR GENERIC HISTORICAL  06/29/2016   IR TRANSCATH/EMBOLIZ 06/29/2016 [name], MD [facility]  . IR GENERIC HISTORICAL  06/29/2016   IR ANGIO VERTEBRAL SEL SUBCLAVIAN INNOMINATE UNI R MOD SED 06/29/2016 [name], MD [facility]  . IR GENERIC HISTORICAL  07/28/2016   IR RADIOLOGIST EVAL & MGMT 07/28/2016 [facility]  . IR GENERIC HISTORICAL  11/12/2016   IR ANGIO INTRA EXTRACRAN SEL COM CAROTID INNOMINATE BILAT MOD SED 11/12/2016 [name], MD [facility]  . IR GENERIC HISTORICAL  11/12/2016   IR ANGIO VERTEBRAL SEL VERTEBRAL  UNI L MOD SED 11/12/2016 Julieanne CottonSanjeev Deveshwar, MD [facility]  . IR GENERIC HISTORICAL  11/12/2016   IR ANGIO VERTEBRAL SEL SUBCLAVIAN INNOMINATE UNI R MOD SED 11/12/2016 Julieanne CottonSanjeev Deveshwar, MD [facility]  . NASAL SEPTUM SURGERY    . RADIOLOGY WITH ANESTHESIA N/A 06/29/2016   Procedure: EMBOLIZATION;  Surgeon: Julieanne CottonSanjeev Deveshwar, MD;  Location[facility];  Service: Radiology;  Laterality: N/A;  . ROTATOR CUFF REPAIR      Current Medications: Prior to Admission medications   Medication Sig Start Date End Date Taking? Authorizing Provider  acetaminophen (TYLENOL) 650 MG CR tablet  Take 1,300 mg by mouth every 8 (eight) hours as needed for pain.    [provider]  acidophilus (RISAQUAD) CAPS capsule Take 1 capsule by mouth daily.    [provider]  ALPRAZolam [name](XANAX) 0.5 MG tablet Take 0.5 mg by mouth 3 (three) times daily.     [provider]  aspirin EC 81 MG tablet Take 81 mg by mouth daily.    [provider]  butalbital-acetaminophen-caffeine (FIORICET, ESGIC) 50-325-40 MG per tablet Take 1-2 tablets by mouth every 4 (four) hours as needed for headache or migraine.  07/06/14   [provider]  cholecalciferol (VITAMIN D) 1000 units tablet Take 1,000 Units by mouth daily.    [provider]  clopidogrel (PLAVIX) 75 MG tablet Take 75 mg by mouth daily. 06/18/16   [provider]  fexofenadine-pseudoephedrine (ALLEGRA-D) 60-120 MG 12 hr tablet Take 1 tablet by mouth daily as needed (allergies).     [provider]  flecainide (TAMBOCOR) 100 MG tablet TAKE 1 TABLET BY MOUTH TWICE A DAY 04/12/17   Tereso NewcomerWeaver, Scott T, PA-C  FLUoxetine (PROZAC) 20 MG capsule Take 20 mg by mouth 2 (two) times daily. 06/07/16   [provider]  fluticasone (FLONASE) 50 MCG/ACT nasal spray Place 1 spray into both nostrils daily as needed for allergies or rhinitis.  10/11/14   [provider]  lactose free nutrition (BOOST) LIQD Take 237 mLs by mouth daily as needed (nutrional supplement).     [provider]  losartan (COZAAR) 25 MG tablet TAKE 1 TABLET BY MOUTH EVERY DAY 06/29/17   Tereso NewcomerWeaver, Scott T, PA-C  metoprolol succinate (TOPROL-XL) 25 MG 24 hr tablet Take 1 tablet (25 mg total) by mouth daily. 03/16/17   Tereso NewcomerWeaver, Scott T, PA-C  omeprazole (PRILOSEC) 40 MG capsule Take 40 mg by mouth daily.      [provider]  potassium chloride SA (K-DUR,KLOR-CON) 20 MEQ tablet Take  60 meq today and then 40 meq there after 03/19/17   Tereso NewcomerWeaver, Scott T, PA-C  pyridostigmine (MESTINON) 60 MG tablet Take 0.5  tablets (30 mg total) by mouth 3 (three) times daily. 02/12/17 02/12/18  Tereso NewcomerWeaver, Scott T, PA-C    Allergies:   Dobutamine; Epinephrine; Darvocet [propoxyphene n-acetaminophen]; Excedrin extra strength [aspirin-acetaminophen-caffeine]; Clarithromycin; Doxycycline; Penicillins; Prednisone; and Propoxyphene   Social History   Socioeconomic History  . Marital status: Widowed    Spouse name: None  . Number of children: 2  . Years of education: 8012  . Highest education level: None  Social Needs  . Financial resource strain: None  . Food insecurity - worry: None  . Food insecurity - inability: None  . Transportation needs - medical: None  . Transportation needs - non-medical: None  Occupational History  . Occupation: Retired  Tobacco Use  . Smoking status: Current Every Day Smoker    Packs/day: 1.00  Years: 50.00    Pack years: 50.00  . Smokeless tobacco: Current User  Substance and Sexual Activity  . Alcohol use: No  . Drug use: No  . Sexual activity: None  Other Topics Concern  . None  Social History Narrative   Lives with significant other, [name]    Caffeine use: none     Family History:  The patient's family history includes Asthma in her other; CAD in her unknown relative; Heart Problems in her brother; Heart attack in her mother and other; Heart attack (age of onset: 48) in her father; Heart failure in her other; Osteoporosis in her other; Transient ischemic attack in her mother.   ROS:   Please see the history of present illness.    ROS All other systems reviewed and are negative.   PHYSICAL EXAM:   VS:  BP (!) 160/90   Pulse (!) 53   Ht 5\' 3"  (1.6 m)   Wt 109 lb (49.4 kg)   BMI 19.31 kg/m    GEN: Well nourished, well developed, in no acute distress  HEENT: normal  Neck: no JVD, carotid bruits, or masses Cardiac: RRR; no murmurs, rubs, or gallops,no edema  Respiratory:  clear to auscultation bilaterally, normal work of breathing GI: soft, nontender,  nondistended, + BS MS: no deformity or atrophy  Skin: warm and dry, no rash Neuro:  Alert and Oriented x 3, Strength and sensation are intact Psych: euthymic mood, full affect  Wt Readings from Last 3 Encounters:  07/29/17 109 lb (49.4 kg)  05/03/17 108 lb (49 kg)  03/05/17 113 lb (51.3 kg)      Studies/Labs Reviewed:   EKG:  EKG is ordered today.  The ekg ordered today demonstrates sinus rhythm at rate of 53 bpm  Recent Labs: 11/12/2016: Hemoglobin 11.9; Platelets 319 03/18/2017: BUN 8; Potassium 2.6; Sodium 142 05/31/2017: Creatinine, Ser 0.90   Lipid Panel    Component Value Date/Time   CHOL 188 07/24/2016 1125   TRIG 120 07/24/2016 1125   HDL 65 07/24/2016 1125   CHOLHDL 2.9 07/24/2016 1125   VLDL 24 07/24/2016 1125   LDLCALC 99 07/24/2016 1125    Additional studies/ records that were reviewed today include:   48 hours holter 04/16/17 SInus rhythm  Rates 49 to 75 bpm  Average HR 60 bpm   Rare PAC No significant pauses  Echo 04/11/16 Mild LVH, EF 60-65, normal wall motion, grade 2 diastolic dysfunction, mild AI, mild MR, mild LAE  Myoview 10/12 Normal stress nuclear study.  LHC 10/02 Normal coronary arteries  Echo 11/01 EF 60%, mild anterior MVP with trivial MR, PASP 35-40 mmHg, LAE  ASSESSMENT & PLAN:    1. Palpitation -Triggered by excessive chocolate intake.  Advised to avoid any caffeinated products.  Intermittent "Skipping" while episode.  However, similar to prior SVT.  Discussed 30-day monitor to rule out atrial fibrillation.  However patient wants to delay for now.  Agreed.  She will abstain from caffeine intake.  If recurrent symptoms consider monitor for further evaluation.  2.  SVT -As above.  On flecainide therapy.  Check electrolytes today.  3.  Hypertension with history of orthostatic hypotension -Stable.  No dizziness.  No change.  4. Tobacco abuse - Advised cessation. Education given.   Medication Adjustments/Labs and Tests  Ordered: Current medicines are reviewed at length with the patient today.  Concerns regarding medicines are outlined above.  Medication changes, Labs and Tests ordered today are listed in the Patient Instructions below.  Patient Instructions  Medication Instructions: Your physician recommends that you continue on your current medications as directed. Please refer to the Current Medication list given to you today.  Labwork: Your physician has recommended that you have lab work today: BMET and Magnesium Panel  Procedures/Testing: None Ordered  Follow-Up: Your physician recommends that you keep your scheduled follow up with Dr. Tenny Crawoss.  If you need a refill on your cardiac medications before your next appointment, please call your pharmacy.      Barbara PontSigned, Jianna Drabik, [place]  07/29/2017 9:44 AM    [facility] [address], [place], [place]  [address] Phone: [phone]; Fax: [phone]

## 2017-07-29 ENCOUNTER — Ambulatory Visit: Payer: Medicare HMO | Admitting: Physician Assistant

## 2017-07-29 ENCOUNTER — Encounter (INDEPENDENT_AMBULATORY_CARE_PROVIDER_SITE_OTHER): Payer: Self-pay

## 2017-07-29 ENCOUNTER — Encounter: Payer: Self-pay | Admitting: Physician Assistant

## 2017-07-29 ENCOUNTER — Other Ambulatory Visit: Payer: Self-pay | Admitting: Physician Assistant

## 2017-07-29 VITALS — BP 160/90 | HR 53 | Ht 63.0 in | Wt 109.0 lb

## 2017-07-29 DIAGNOSIS — Z79899 Other long term (current) drug therapy: Secondary | ICD-10-CM | POA: Diagnosis not present

## 2017-07-29 DIAGNOSIS — I10 Essential (primary) hypertension: Secondary | ICD-10-CM | POA: Diagnosis not present

## 2017-07-29 DIAGNOSIS — F17200 Nicotine dependence, unspecified, uncomplicated: Secondary | ICD-10-CM | POA: Diagnosis not present

## 2017-07-29 DIAGNOSIS — I471 Supraventricular tachycardia, unspecified: Secondary | ICD-10-CM

## 2017-07-29 DIAGNOSIS — I951 Orthostatic hypotension: Secondary | ICD-10-CM

## 2017-07-29 DIAGNOSIS — R002 Palpitations: Secondary | ICD-10-CM

## 2017-07-29 LAB — BASIC METABOLIC PANEL
BUN/Creatinine Ratio: 11 — ABNORMAL LOW (ref 12–28)
BUN: 10 mg/dL (ref 8–27)
CALCIUM: 9.1 mg/dL (ref 8.7–10.3)
CHLORIDE: 100 mmol/L (ref 96–106)
CO2: 27 mmol/L (ref 20–29)
Creatinine, Ser: 0.95 mg/dL (ref 0.57–1.00)
GFR calc Af Amer: 68 mL/min/{1.73_m2} (ref >59)
GFR calc non Af Amer: 59 mL/min/{1.73_m2} — ABNORMAL LOW (ref >59)
GLUCOSE: 87 mg/dL (ref 65–99)
POTASSIUM: 3.6 mmol/L (ref 3.5–5.2)
SODIUM: 140 mmol/L (ref 134–144)

## 2017-07-29 LAB — MAGNESIUM: MAGNESIUM: 1.3 mg/dL — AB (ref 1.6–2.3)

## 2017-07-29 NOTE — Progress Notes (Signed)
Done tc 07/29/17

## 2017-07-29 NOTE — Patient Instructions (Addendum)
Medication Instructions: Your physician recommends that you continue on your current medications as directed. Please refer to the Current Medication list given to you today.  Labwork: Your physician has recommended that you have lab work today: BMET and Magnesium Panel  Procedures/Testing: None Ordered  Follow-Up: Your physician recommends that you keep your scheduled follow up with Dr. Tenny Crawoss.  If you need a refill on your cardiac medications before your next appointment, please call your pharmacy.

## 2017-09-23 ENCOUNTER — Ambulatory Visit: Payer: Medicare HMO | Admitting: Internal Medicine

## 2017-10-13 ENCOUNTER — Other Ambulatory Visit: Payer: Self-pay | Admitting: Internal Medicine

## 2017-10-13 DIAGNOSIS — I471 Supraventricular tachycardia: Secondary | ICD-10-CM

## 2017-10-13 MED ORDER — METOPROLOL SUCCINATE ER 25 MG PO TB24: 25.0000 mg | ORAL_TABLET | Freq: Every day | ORAL | 3 refills | Status: DC

## 2017-10-13 MED ORDER — FLECAINIDE ACETATE 100 MG PO TABS: 100.0000 mg | ORAL_TABLET | Freq: Two times a day (BID) | ORAL | 3 refills | Status: DC

## 2017-10-21 ENCOUNTER — Ambulatory Visit: Payer: Self-pay | Admitting: Internal Medicine

## 2017-10-21 ENCOUNTER — Ambulatory Visit: Payer: Medicare HMO | Admitting: Internal Medicine

## 2017-10-21 ENCOUNTER — Encounter: Payer: Self-pay | Admitting: Internal Medicine

## 2017-10-21 DIAGNOSIS — I10 Essential (primary) hypertension: Secondary | ICD-10-CM

## 2017-10-21 DIAGNOSIS — I471 Supraventricular tachycardia, unspecified: Secondary | ICD-10-CM

## 2017-10-21 DIAGNOSIS — R002 Palpitations: Secondary | ICD-10-CM | POA: Diagnosis not present

## 2017-10-21 DIAGNOSIS — I951 Orthostatic hypotension: Secondary | ICD-10-CM | POA: Diagnosis not present

## 2017-10-21 MED ORDER — FLECAINIDE ACETATE 50 MG PO TABS: 75.0000 mg | ORAL_TABLET | Freq: Two times a day (BID) | ORAL | 11 refills | Status: DC

## 2017-10-21 MED ORDER — FLECAINIDE ACETATE 50 MG PO TABS: 50.0000 mg | ORAL_TABLET | Freq: Two times a day (BID) | ORAL | 3 refills | Status: DC

## 2017-10-21 NOTE — Patient Instructions (Signed)
Your physician has recommended you make the following change in your medication:  1.) decrease flecainide to 75 mg twice a day  Your physician wants you to follow-up in: August, 2019 with Dr. Tenny Crawoss. You will receive a reminder letter in the mail two months in advance. If you don't receive a letter, please call our office to schedule the follow-up appointment.

## 2017-10-21 NOTE — Progress Notes (Signed)
Cardiology Office Note   Date:  10/21/2017   ID:  Barbara, Hill 1941/03/25, MRN [id number]  PCP:  Richmond Campbell., PA-C  Cardiologist:   Dietrich Pates, MD   F/U of orthostasis and HTN    History of Present Illness: Barbara Hill is a 77 y.o. female with a history of orthostatic intolerance and hypertension, SVT, palpitations.  Syncopal spell in 2017  I asw her in March  Since the she has been seen by Wende Mott several times    Since I saw her she has been seen by B Bhagat   Patien says dizziness comes and goes  Not as bad   Usually when gts up quick  1 to 2 x per day When she is not having spells she feels OK   Denies palpitations    Recovrering for URI    Current Meds  Medication Sig  . acetaminophen (TYLENOL) 650 MG CR tablet Take 1,300 mg by mouth every 8 (eight) hours as needed for pain.  Marland Kitchen acidophilus (RISAQUAD) CAPS capsule Take 1 capsule by mouth daily.  Marland Kitchen ALPRAZolam (XANAX) 0.5 MG tablet Take 0.5 mg by mouth 3 (three) times daily.   Marland Kitchen aspirin EC 81 MG tablet Take 81 mg by mouth daily.  . butalbital-acetaminophen-caffeine (FIORICET, ESGIC) 50-325-40 MG per tablet Take 1-2 tablets by mouth every 4 (four) hours as needed for headache or migraine.   . cholecalciferol (VITAMIN D) 1000 units tablet Take 1,000 Units by mouth daily.  . clopidogrel (PLAVIX) 75 MG tablet Take 75 mg by mouth daily.  . flecainide (TAMBOCOR) 100 MG tablet Take 1 tablet (100 mg total) by mouth 2 (two) times daily.  Marland Kitchen FLUoxetine (PROZAC) 20 MG capsule Take 20 mg by mouth 2 (two) times daily.  . fluticasone (FLONASE) 50 MCG/ACT nasal spray Place 1 spray into both nostrils daily as needed for allergies or rhinitis.   Marland Kitchen lactose free nutrition (BOOST) LIQD Take 237 mLs by mouth daily as needed (nutrional supplement).   . losartan (COZAAR) 25 MG tablet TAKE 1 TABLET BY MOUTH EVERY DAY  . metoprolol succinate (TOPROL-XL) 25 MG 24 hr tablet Take 1 tablet (25 mg total) by mouth daily.  Marland Kitchen omeprazole  (PRILOSEC) 40 MG capsule Take 40 mg by mouth daily.    . potassium chloride SA (K-DUR,KLOR-CON) 20 MEQ tablet Take  60 meq today and then 40 meq there after     Allergies:   Dobutamine; Epinephrine; Darvocet [propoxyphene n-acetaminophen]; Excedrin extra strength [aspirin-acetaminophen-caffeine]; Clarithromycin; Doxycycline; Penicillins; Prednisone; and Propoxyphene   Past Medical History:  Diagnosis Date  . Anxiety   . Brain aneurysm    pipeline stent 2017   . COPD (chronic obstructive pulmonary disease) (HCC)   . Fatigue   . H/O: hysterectomy   . Headache(784.0)   . History of cardiac catheterization    LHC 10/02: Normal coronary arteries  . History of nuclear stress test    Myoview 10/12: Normal stress nuclear study.  . Hypertension   . Mitral valve prolapse    Echo 8/17:Mild LVH, EF 60-65, normal wall motion, grade 2 diastolic dysfunction, mild AI, mild MR, mild LAE // echo 11/01: EF 60%, mild anterior MVP with trivial MR, PASP 35-40 mmHg, LAE  . Orthostasis   . PSVT (paroxysmal supraventricular tachycardia) (HCC)    palps  . Radiculopathy     Past Surgical History:  Procedure Laterality Date  . ABDOMINAL HYSTERECTOMY    . APPENDECTOMY    . CARDIAC  CATHETERIZATION  12/95  . CHOLECYSTECTOMY    . IR GENERIC HISTORICAL  06/02/2016   IR RADIOLOGIST EVAL & MGMT 06/02/2016 [facility]  . IR GENERIC HISTORICAL  06/29/2016   IR ANGIO INTRA EXTRACRAN SEL INTERNAL CAROTID UNI R MOD SED 06/29/2016 Barbara Cotton, MD [facility]  . IR GENERIC HISTORICAL  06/29/2016   IR ANGIOGRAM FOLLOW UP STUDY 06/29/2016 Barbara Cotton, MD [facility]  . IR GENERIC HISTORICAL  06/29/2016   IR NEURO EACH ADD'L AFTER BASIC UNI RIGHT (MS) 06/29/2016 Barbara Cotton, MD [facility]  . IR GENERIC HISTORICAL  06/29/2016   IR 3D INDEPENDENT WKST 06/29/2016 Barbara Cotton, MD [facility]  . IR GENERIC HISTORICAL  06/29/2016   IR TRANSCATH/EMBOLIZ 06/29/2016 Barbara Cotton, MD [facility]  . IR GENERIC HISTORICAL  06/29/2016   IR ANGIO VERTEBRAL SEL SUBCLAVIAN INNOMINATE UNI R MOD SED 06/29/2016 Barbara Cotton, MD [facility]  . IR GENERIC HISTORICAL  07/28/2016   IR RADIOLOGIST EVAL & MGMT 07/28/2016 [facility]  . IR GENERIC HISTORICAL  11/12/2016   IR ANGIO INTRA EXTRACRAN SEL COM CAROTID INNOMINATE BILAT MOD SED 11/12/2016 Barbara Cotton, MD [facility]  . IR GENERIC HISTORICAL  11/12/2016   IR ANGIO VERTEBRAL SEL VERTEBRAL UNI L MOD SED 11/12/2016 Barbara Cotton, MD [facility]  . IR GENERIC HISTORICAL  11/12/2016   IR ANGIO VERTEBRAL SEL SUBCLAVIAN INNOMINATE UNI R MOD SED 11/12/2016 Barbara Cotton, MD [facility]  . NASAL SEPTUM SURGERY    . RADIOLOGY WITH ANESTHESIA N/A 06/29/2016   Procedure: EMBOLIZATION;  Surgeon: Barbara Cotton, MD;  Location: [facility];  Service: Radiology;  Laterality: N/A;  . ROTATOR CUFF REPAIR       Social History:  The patient  reports that she has been smoking.  She has a 50.00 pack-year smoking history. She uses smokeless tobacco. She reports that she does not drink alcohol or use drugs.   Family History:  The patient's family history includes Asthma in her other; CAD in her unknown relative; Heart Problems in her brother; Heart attack in her mother and other; Heart attack (age of onset: 47) in her father; Heart failure in her other; Osteoporosis in her other; Transient ischemic attack in her mother.    ROS:  Please see the history of present illness. All other systems are reviewed and  Negative to the above problem except as noted.    PHYSICAL EXAM: VS:  BP (!) 146/74   Pulse (!) 59   Ht 5\' 3"  (1.6 m)   Wt 107 lb 6.4 oz (48.7 kg)   SpO2 99%   BMI 19.03 kg/m   GEN: Thin 77 yo, in no acute distress  HEENT: normal  Neck: no JVD, carotid bruits, or masses Cardiac: RRR; no murmurs, rubs, or gallops,no edema  Respiratory:  clear to auscultation bilaterally, normal work of breathing GI: soft, nontender, nondistended,  + BS  No hepatomegaly  MS: no deformity Moving all extremities   Skin: warm and dry, no rash Neuro:  Strength and sensation are intact Psych: euthymic mood, full affect   EKG:  EKG is not ordered today.   Lipid Panel    Component Value Date/Time   CHOL 188 07/24/2016 1125   TRIG 120 07/24/2016 1125   HDL 65 07/24/2016 1125   CHOLHDL 2.9 07/24/2016 1125   VLDL 24 07/24/2016 1125   LDLCALC 99 07/24/2016 1125      Wt Readings from Last 3 Encounters:  10/21/17 107 lb 6.4 oz (48.7  kg)  07/29/17 109 lb (49.4 kg)  05/03/17 108 lb (49 kg)      ASSESSMENT AND PLAN:  1  HTN  BP is OK   I would keep on same regimen   2  Orthostatic intolerance  Symptoms are pretty good   Spells short  When not having he feels OK  3  Palpitations  Denies    I have reviewed with EP   I would put back to 75 bid flecanide   F/U later this August   Current medicines are reviewed at length with the patient today.  The patient does not have concerns regarding medicines.  Signed, Dietrich PatesPaula Taesha Goodell, MD  10/21/2017 3:05 PM    [facility] [address], [place], [place]  [address] Phone: [phone]; Fax: [phone]

## 2017-11-17 ENCOUNTER — Ambulatory Visit
Admission: RE | Admit: 2017-11-17 | Discharge: 2017-11-17 | Disposition: A | Discharge status: Home / Self Care | Payer: Medicare HMO | Source: Ambulatory Visit | Attending: Family Medicine | Admitting: Family Medicine

## 2017-11-17 ENCOUNTER — Other Ambulatory Visit: Payer: Self-pay | Admitting: Family Medicine

## 2017-11-17 DIAGNOSIS — M545 Low back pain, unspecified: Secondary | ICD-10-CM

## 2017-12-20 ENCOUNTER — Other Ambulatory Visit (HOSPITAL_COMMUNITY): Payer: Self-pay | Admitting: Interventional Radiology

## 2017-12-20 DIAGNOSIS — I771 Stricture of artery: Secondary | ICD-10-CM

## 2018-01-07 ENCOUNTER — Ambulatory Visit (HOSPITAL_COMMUNITY): Payer: Medicare HMO

## 2018-01-07 ENCOUNTER — Ambulatory Visit (HOSPITAL_COMMUNITY)
Admission: RE | Admit: 2018-01-07 | Discharge: 2018-01-07 | Disposition: A | Discharge status: Home / Self Care | Payer: Medicare HMO | Source: Ambulatory Visit | Attending: Interventional Radiology | Admitting: Interventional Radiology

## 2018-01-07 DIAGNOSIS — I771 Stricture of artery: Secondary | ICD-10-CM

## 2018-01-07 DIAGNOSIS — I671 Cerebral aneurysm, nonruptured: Secondary | ICD-10-CM | POA: Insufficient documentation

## 2018-01-07 DIAGNOSIS — I672 Cerebral atherosclerosis: Secondary | ICD-10-CM | POA: Diagnosis not present

## 2018-01-07 MED ORDER — IOPAMIDOL (ISOVUE-370) INJECTION 76%
50.0000 mL | Freq: Once | INTRAVENOUS | Status: AC | PRN
  Administered 2018-01-07: 50 mL via INTRAVENOUS

## 2018-01-07 MED ORDER — IOPAMIDOL (ISOVUE-370) INJECTION 76%
INTRAVENOUS | Status: AC
  Filled 2018-01-07: qty 50

## 2018-01-08 LAB — POCT I-STAT CREATININE: CREATININE: 1.3 mg/dL — AB (ref 0.44–1.00)

## 2018-01-12 ENCOUNTER — Telehealth (HOSPITAL_COMMUNITY): Payer: Self-pay

## 2018-01-12 NOTE — Telephone Encounter (Signed)
Pt agreed to f/u in 6 months with CTA head/neck. AW 

## 2018-03-23 ENCOUNTER — Other Ambulatory Visit: Payer: Self-pay | Admitting: Internal Medicine

## 2018-03-23 DIAGNOSIS — I10 Essential (primary) hypertension: Secondary | ICD-10-CM

## 2018-03-23 DIAGNOSIS — I471 Supraventricular tachycardia: Secondary | ICD-10-CM

## 2018-04-10 ENCOUNTER — Other Ambulatory Visit: Payer: Self-pay | Admitting: Physician Assistant

## 2018-04-10 DIAGNOSIS — I10 Essential (primary) hypertension: Secondary | ICD-10-CM

## 2018-04-15 ENCOUNTER — Ambulatory Visit: Payer: Medicare HMO | Admitting: Internal Medicine

## 2018-05-17 ENCOUNTER — Ambulatory Visit: Payer: Medicare HMO | Admitting: Cardiology

## 2018-05-17 ENCOUNTER — Encounter: Payer: Self-pay | Admitting: Cardiology

## 2018-05-17 VITALS — BP 124/58 | HR 57 | Ht 63.0 in | Wt 109.8 lb

## 2018-05-17 DIAGNOSIS — I10 Essential (primary) hypertension: Secondary | ICD-10-CM

## 2018-05-17 DIAGNOSIS — F17200 Nicotine dependence, unspecified, uncomplicated: Secondary | ICD-10-CM

## 2018-05-17 DIAGNOSIS — E876 Hypokalemia: Secondary | ICD-10-CM | POA: Diagnosis not present

## 2018-05-17 DIAGNOSIS — I951 Orthostatic hypotension: Secondary | ICD-10-CM

## 2018-05-17 DIAGNOSIS — R002 Palpitations: Secondary | ICD-10-CM

## 2018-05-17 DIAGNOSIS — I471 Supraventricular tachycardia: Secondary | ICD-10-CM | POA: Diagnosis not present

## 2018-05-17 LAB — BASIC METABOLIC PANEL
BUN / CREAT RATIO: 11 — AB (ref 12–28)
BUN: 10 mg/dL (ref 8–27)
CO2: 25 mmol/L (ref 20–29)
CREATININE: 0.91 mg/dL (ref 0.57–1.00)
Calcium: 9.1 mg/dL (ref 8.7–10.3)
Chloride: 101 mmol/L (ref 96–106)
GFR calc Af Amer: 71 mL/min/{1.73_m2} (ref >59)
GFR, EST NON AFRICAN AMERICAN: 61 mL/min/{1.73_m2} (ref >59)
Glucose: 92 mg/dL (ref 65–99)
Potassium: 3.4 mmol/L — ABNORMAL LOW (ref 3.5–5.2)
SODIUM: 143 mmol/L (ref 134–144)

## 2018-05-17 MED ORDER — POTASSIUM CHLORIDE CRYS ER 20 MEQ PO TBCR: 20.0000 meq | EXTENDED_RELEASE_TABLET | Freq: Two times a day (BID) | ORAL | 12 refills | Status: DC

## 2018-05-17 NOTE — Progress Notes (Signed)
Cardiology Office Note:    Date:  05/17/2018   ID:  Barbara Hill, DOB Jul 06, 1941, MRN [id number]  PCP:  Richmond Campbell., PA-C  Cardiologist:  Dietrich Pates, MD  Referring MD: Richmond Campbell., PA-C   Chief Complaint  Patient presents with  . Follow-up    History of Present Illness:    Barbara Hill is a 77 y.o. female with a past medical history significant for orthostatic intolerance and hypertension, SVT, palpitations.  Syncopal episode in 2017.  She was last seen in the office on 10/21/17 by Dr. Tenny Craw at which time she was noted to continue to have dizziness coming and going, but not as bad as in the past.   She is here today for 6 month follow up with her daughter and husband. She has been feel a little better. Is taking Super B complex and she thinks that is helping. She states that she still has intermittent times of being off balance. She has had some episodes of feeling like her heart is flying and then "it gets back on track"  lasting less than a minute. Occurs not daily, more in the last couple of weeks.   Breathing is good, no chest pain/pressure, orthopnea, PND or edema. She had lost wt but now has gained some back.   Past Medical History:  Diagnosis Date  . Anxiety   . Brain aneurysm    pipeline stent 2017   . COPD (chronic obstructive pulmonary disease) (HCC)   . Fatigue   . H/O: hysterectomy   . Headache(784.0)   . History of cardiac catheterization    LHC 10/02: Normal coronary arteries  . History of nuclear stress test    Myoview 10/12: Normal stress nuclear study.  . Hypertension   . Mitral valve prolapse    Echo 8/17:Mild LVH, EF 60-65, normal wall motion, grade 2 diastolic dysfunction, mild AI, mild MR, mild LAE // echo 11/01: EF 60%, mild anterior MVP with trivial MR, PASP 35-40 mmHg, LAE  . Orthostasis   . PSVT (paroxysmal supraventricular tachycardia) (HCC)    palps  . Radiculopathy     Past Surgical History:  Procedure Laterality Date  .  ABDOMINAL HYSTERECTOMY    . APPENDECTOMY    . CARDIAC CATHETERIZATION  12/95  . CHOLECYSTECTOMY    . IR GENERIC HISTORICAL  06/02/2016   IR RADIOLOGIST EVAL & MGMT 06/02/2016 [facility]  . IR GENERIC HISTORICAL  06/29/2016   IR ANGIO INTRA EXTRACRAN SEL INTERNAL CAROTID UNI R MOD SED 06/29/2016 [name], MD [facility]  . IR GENERIC HISTORICAL  06/29/2016   IR ANGIOGRAM FOLLOW UP STUDY 06/29/2016 [name], MD [facility]  . IR GENERIC HISTORICAL  06/29/2016   IR NEURO EACH ADD'L AFTER BASIC UNI RIGHT (MS) 06/29/2016 [name], MD [facility]  . IR GENERIC HISTORICAL  06/29/2016   IR 3D INDEPENDENT WKST 06/29/2016 [name], MD [facility]  . IR GENERIC HISTORICAL  06/29/2016   IR TRANSCATH/EMBOLIZ 06/29/2016 [name], MD [facility]  . IR GENERIC HISTORICAL  06/29/2016   IR ANGIO VERTEBRAL SEL SUBCLAVIAN INNOMINATE UNI R MOD SED 06/29/2016 [name], MD [facility]  . IR GENERIC HISTORICAL  07/28/2016   IR RADIOLOGIST EVAL & MGMT 07/28/2016 [facility]  . IR GENERIC HISTORICAL  11/12/2016   IR ANGIO INTRA EXTRACRAN SEL COM CAROTID INNOMINATE BILAT MOD SED 11/12/2016 [name], MD [facility]  . IR GENERIC HISTORICAL  11/12/2016   IR ANGIO VERTEBRAL  SEL VERTEBRAL UNI L MOD SED 11/12/2016 [name], MD [facility]  . IR GENERIC HISTORICAL  11/12/2016   IR ANGIO VERTEBRAL SEL SUBCLAVIAN INNOMINATE UNI R MOD SED 11/12/2016 [name], MD [facility]  . NASAL SEPTUM SURGERY    . RADIOLOGY WITH ANESTHESIA N/A 06/29/2016   Procedure: EMBOLIZATION;  Surgeon: [name], MD;  Location: [facility];  Service: Radiology;  Laterality: N/A;  . ROTATOR CUFF REPAIR      Current Medications: Current Meds  Medication Sig  . acetaminophen (TYLENOL) 650 MG CR tablet Take 1,300 mg by mouth every 8 (eight) hours as needed for pain.  Marland Kitchen acidophilus (RISAQUAD) CAPS capsule Take 1 capsule by mouth daily.  Marland Kitchen ALPRAZolam (XANAX)  0.5 MG tablet Take 0.5 mg by mouth 3 (three) times daily.   . butalbital-acetaminophen-caffeine (FIORICET, ESGIC) 50-325-40 MG per tablet Take 1-2 tablets by mouth every 4 (four) hours as needed for headache or migraine.   . cholecalciferol (VITAMIN D) 1000 units tablet Take 1,000 Units by mouth daily.  . clopidogrel (PLAVIX) 75 MG tablet Take 75 mg by mouth daily.  . flecainide (TAMBOCOR) 50 MG tablet Take 1.5 tablets (75 mg total) by mouth 2 (two) times daily.  Marland Kitchen FLUoxetine (PROZAC) 20 MG capsule Take 20 mg by mouth 2 (two) times daily.  . fluticasone (FLONASE) 50 MCG/ACT nasal spray Place 1 spray into both nostrils daily as needed for allergies or rhinitis.   Marland Kitchen lactose free nutrition (BOOST) LIQD Take 237 mLs by mouth daily as needed (nutrional supplement).   . losartan (COZAAR) 25 MG tablet TAKE 1 TABLET BY MOUTH EVERY DAY  . metoprolol succinate (TOPROL-XL) 25 MG 24 hr tablet TAKE 1 TABLET BY MOUTH AT BEDTIME  . omeprazole (PRILOSEC) 40 MG capsule Take 40 mg by mouth daily.    . potassium chloride SA (K-DUR,KLOR-CON) 20 MEQ tablet Take 1 tablet (20 mEq total) by mouth 2 (two) times daily.  . [DISCONTINUED] potassium chloride SA (K-DUR,KLOR-CON) 20 MEQ tablet Take  60 meq today and then 40 meq there after     Allergies:   Dobutamine; Epinephrine; Darvocet [propoxyphene n-acetaminophen]; Excedrin extra strength [aspirin-acetaminophen-caffeine]; Clarithromycin; Doxycycline; Penicillins; Prednisone; and Propoxyphene   Social History   Socioeconomic History  . Marital status: Widowed    Spouse name: Not on file  . Number of children: 2  . Years of education: 57  . Highest education level: Not on file  Occupational History  . Occupation: Retired  Engineer, production  . Financial resource strain: Not on file  . Food insecurity:    Worry: Not on file    Inability: Not on file  . Transportation needs:    Medical: Not on file    Non-medical: Not on file  Tobacco Use  . Smoking status: Current  Every Day Smoker    Packs/day: 1.00    Years: 50.00    Pack years: 50.00  . Smokeless tobacco: Current User  Substance and Sexual Activity  . Alcohol use: No  . Drug use: No  . Sexual activity: Not on file  Lifestyle  . Physical activity:    Days per week: Not on file    Minutes per session: Not on file  . Stress: Not on file  Relationships  . Social connections:    Talks on phone: Not on file    Gets together: Not on file    Attends religious service: Not on file    Active member of club or organization: Not on file  Attends meetings of clubs or organizations: Not on file    Relationship status: Not on file  Other Topics Concern  . Not on file  Social History Narrative   Lives with significant other, [name]    Caffeine use: none     Family History: The patient's family history includes Asthma in her other; CAD in her unknown relative; Heart Problems in her brother; Heart attack in her mother and other; Heart attack (age of onset: 4780) in her father; Heart failure in her other; Osteoporosis in her other; Transient ischemic attack in her mother. ROS:   Please see the history of present illness.     All other systems reviewed and are negative.  EKGs/Labs/Other Studies Reviewed:    The following studies were reviewed today:  48 hours holter 04/16/17 SInus rhythm Rates 49 to 75 bpm Average HR 60 bpm  Rare PAC No significant pauses  Echo 04/11/16 Mild LVH, EF 60-65, normal wall motion, grade 2 diastolic dysfunction, mild AI, mild MR, mild LAE  Myoview 10/12 Normal stress nuclear study.  LHC 10/02 Normal coronary arteries  Echo 11/01 EF 60%, mild anterior MVP with trivial MR, PASP 35-40 mmHg, LAE  EKG:  EKG is ordered today.  The ekg ordered today demonstrates sinus bradycardia, 57 bpm, no significant change from prior EKGs  Recent Labs: 07/29/2017: Magnesium 1.3 05/17/2018: BUN 10; Creatinine, Ser 0.91; Potassium 3.4; Sodium 143   Recent Lipid Panel      Component Value Date/Time   CHOL 188 07/24/2016 1125   TRIG 120 07/24/2016 1125   HDL 65 07/24/2016 1125   CHOLHDL 2.9 07/24/2016 1125   VLDL 24 07/24/2016 1125   LDLCALC 99 07/24/2016 1125    Physical Exam:    VS:  BP (!) 124/58   Pulse (!) 57   Ht 5\' 3"  (1.6 m)   Wt 109 lb 12.8 oz (49.8 kg)   SpO2 97%   BMI 19.45 kg/m     Wt Readings from Last 3 Encounters:  05/17/18 109 lb 12.8 oz (49.8 kg)  10/21/17 107 lb 6.4 oz (48.7 kg)  07/29/17 109 lb (49.4 kg)     Physical Exam  Constitutional: She is oriented to person, place, and time. No distress.  Thin female  HENT:  Head: Normocephalic and atraumatic.  Neck: Normal range of motion. Neck supple. No JVD present.  Cardiovascular: Normal rate, regular rhythm, normal heart sounds and intact distal pulses. Exam reveals no gallop and no friction rub.  No murmur heard. Pulmonary/Chest: Effort normal and breath sounds normal. No respiratory distress. She has no wheezes. She has no rales.  Abdominal: Soft. Bowel sounds are normal.  Musculoskeletal: Normal range of motion. She exhibits no edema.  Neurological: She is alert and oriented to person, place, and time.  Skin: Skin is warm and dry.  Psychiatric: She has a normal mood and affect. Her behavior is normal. Judgment and thought content normal.  Vitals reviewed.    ASSESSMENT:    1. Essential (primary) hypertension   2. Orthostatic hypotension   3. Palpitations   4. Paroxysmal SVT (supraventricular tachycardia) (HCC)   5. Tobacco use disorder   6. Hypokalemia    PLAN:    In order of problems listed above:  Hypertension: BP elevated on arrival. Recheck after rest  124/58  Orthostatic intolerance: pt reports her symptoms are better than they have been in the past. Still a little off balance occasionally.  No recent falls or syncope.  Palpitations: hx of symptoms  related to chocolate/caffeine use.  She has had some periods of feeling brief fast heartbeat over the  past couple of weeks.  She has been without her potasium supplementation.  She denies any recent caffeine use although she does take Fioricet with caffeine but she says her palpitations are not related to the dosing of Fioricet.  Will check electrolytes today and reorder her potassium supplementation.  SVT: on Flecainide.  Patient is on 75 mg daily and has to cut her pill which she is to been doing with scissors and feels that she may have been losing a good bit of her dose.  We discussed getting a pill cutter at the pharmacy and she is going to do this.   Tobacco abuse: Down to 3 cigarettes [per day, actively working on cessation.    Medication Adjustments/Labs and Tests Ordered: Current medicines are reviewed at length with the patient today.  Concerns regarding medicines are outlined above. Labs and tests ordered and medication changes are outlined in the patient instructions below:  Patient Instructions  Medication Instructions: Your physician recommends that you continue on your current medications as directed. Please refer to the Current Medication list given to you today.   Labwork: TODAY: BMET  Procedures/Testing: None  Follow-Up: Your physician recommends that you schedule a follow-up appointment in: 3-4 months with Dr. Tenny Craw    Any Additional Special Instructions Will Be Listed Below (If Applicable).     If you need a refill on your cardiac medications before your next appointment, please call your pharmacy.      Signed, Berton Bon, NP  05/17/2018 5:56 PM    [facility]

## 2018-05-17 NOTE — Patient Instructions (Signed)
Medication Instructions: Your physician recommends that you continue on your current medications as directed. Please refer to the Current Medication list given to you today.   Labwork: TODAY: BMET  Procedures/Testing: None  Follow-Up: Your physician recommends that you schedule a follow-up appointment in: 3-4 months with Dr. Tenny Crawoss    Any Additional Special Instructions Will Be Listed Below (If Applicable).     If you need a refill on your cardiac medications before your next appointment, please call your pharmacy.

## 2018-06-12 ENCOUNTER — Emergency Department (HOSPITAL_BASED_OUTPATIENT_CLINIC_OR_DEPARTMENT_OTHER)
Admission: EM | Admit: 2018-06-12 | Discharge: 2018-06-12 | Disposition: A | Discharge status: Home / Self Care | Payer: Medicare HMO | Attending: Emergency Medicine | Admitting: Emergency Medicine

## 2018-06-12 ENCOUNTER — Other Ambulatory Visit: Payer: Self-pay

## 2018-06-12 ENCOUNTER — Encounter (HOSPITAL_BASED_OUTPATIENT_CLINIC_OR_DEPARTMENT_OTHER): Payer: Self-pay | Admitting: *Deleted

## 2018-06-12 DIAGNOSIS — F17210 Nicotine dependence, cigarettes, uncomplicated: Secondary | ICD-10-CM | POA: Diagnosis not present

## 2018-06-12 DIAGNOSIS — K5900 Constipation, unspecified: Secondary | ICD-10-CM | POA: Insufficient documentation

## 2018-06-12 DIAGNOSIS — R110 Nausea: Secondary | ICD-10-CM | POA: Diagnosis not present

## 2018-06-12 DIAGNOSIS — R5383 Other fatigue: Secondary | ICD-10-CM | POA: Diagnosis present

## 2018-06-12 DIAGNOSIS — J449 Chronic obstructive pulmonary disease, unspecified: Secondary | ICD-10-CM | POA: Diagnosis not present

## 2018-06-12 DIAGNOSIS — I10 Essential (primary) hypertension: Secondary | ICD-10-CM | POA: Insufficient documentation

## 2018-06-12 DIAGNOSIS — Z7902 Long term (current) use of antithrombotics/antiplatelets: Secondary | ICD-10-CM | POA: Diagnosis not present

## 2018-06-12 DIAGNOSIS — Z79899 Other long term (current) drug therapy: Secondary | ICD-10-CM | POA: Diagnosis not present

## 2018-06-12 DIAGNOSIS — R142 Eructation: Secondary | ICD-10-CM | POA: Insufficient documentation

## 2018-06-12 DIAGNOSIS — R1013 Epigastric pain: Secondary | ICD-10-CM | POA: Diagnosis not present

## 2018-06-12 DIAGNOSIS — E875 Hyperkalemia: Secondary | ICD-10-CM | POA: Diagnosis not present

## 2018-06-12 DIAGNOSIS — R531 Weakness: Secondary | ICD-10-CM | POA: Diagnosis not present

## 2018-06-12 LAB — CBC WITH DIFFERENTIAL/PLATELET
Abs Immature Granulocytes: 0.01 10*3/uL (ref 0.00–0.07)
BASOS ABS: 0 10*3/uL (ref 0.0–0.1)
BASOS PCT: 0 %
EOS PCT: 1 %
Eosinophils Absolute: 0.1 10*3/uL (ref 0.0–0.5)
HCT: 34.4 % — ABNORMAL LOW (ref 36.0–46.0)
HEMOGLOBIN: 11.1 g/dL — AB (ref 12.0–15.0)
Immature Granulocytes: 0 %
LYMPHS PCT: 21 %
Lymphs Abs: 1.5 10*3/uL (ref 0.7–4.0)
MCH: 29.2 pg (ref 26.0–34.0)
MCHC: 32.3 g/dL (ref 30.0–36.0)
MCV: 90.5 fL (ref 80.0–100.0)
Monocytes Absolute: 0.5 10*3/uL (ref 0.1–1.0)
Monocytes Relative: 7 %
NRBC: 0 % (ref 0.0–0.2)
Neutro Abs: 4.8 10*3/uL (ref 1.7–7.7)
Neutrophils Relative %: 71 %
PLATELETS: 258 10*3/uL (ref 150–400)
RBC: 3.8 MIL/uL — AB (ref 3.87–5.11)
RDW: 11.9 % (ref 11.5–15.5)
WBC: 6.9 10*3/uL (ref 4.0–10.5)

## 2018-06-12 LAB — COMPREHENSIVE METABOLIC PANEL
ALBUMIN: 3.6 g/dL (ref 3.5–5.0)
ALT: 6 U/L (ref 0–44)
ANION GAP: 9 (ref 5–15)
AST: 16 U/L (ref 15–41)
Alkaline Phosphatase: 66 U/L (ref 38–126)
BUN: 7 mg/dL — ABNORMAL LOW (ref 8–23)
CHLORIDE: 96 mmol/L — AB (ref 98–111)
CO2: 32 mmol/L (ref 22–32)
Calcium: 9.2 mg/dL (ref 8.9–10.3)
Creatinine, Ser: 0.78 mg/dL (ref 0.44–1.00)
GFR calc Af Amer: >60 mL/min (ref >60)
GFR calc non Af Amer: >60 mL/min (ref >60)
GLUCOSE: 118 mg/dL — AB (ref 70–99)
POTASSIUM: 2.6 mmol/L — AB (ref 3.5–5.1)
SODIUM: 137 mmol/L (ref 135–145)
TOTAL PROTEIN: 7.4 g/dL (ref 6.5–8.1)
Total Bilirubin: 0.4 mg/dL (ref 0.3–1.2)

## 2018-06-12 LAB — URINALYSIS, ROUTINE W REFLEX MICROSCOPIC
BILIRUBIN URINE: NEGATIVE
Glucose, UA: NEGATIVE mg/dL
Ketones, ur: NEGATIVE mg/dL
Leukocytes, UA: NEGATIVE
Nitrite: NEGATIVE
Protein, ur: NEGATIVE mg/dL
SPECIFIC GRAVITY, URINE: 1.01 (ref 1.005–1.030)
pH: 6.5 (ref 5.0–8.0)

## 2018-06-12 LAB — URINALYSIS, MICROSCOPIC (REFLEX)

## 2018-06-12 LAB — LIPASE, BLOOD: Lipase: 30 U/L (ref 11–51)

## 2018-06-12 LAB — TROPONIN I: Troponin I: <0.03 ng/mL (ref <0.03)

## 2018-06-12 MED ORDER — POTASSIUM CHLORIDE 10 MEQ/100ML IV SOLN
10.0000 meq | INTRAVENOUS | Status: AC
  Administered 2018-06-12 (×2): 10 meq via INTRAVENOUS
  Filled 2018-06-12: qty 100

## 2018-06-12 MED ORDER — PROMETHAZINE HCL 25 MG/ML IJ SOLN
12.5000 mg | Freq: Once | INTRAMUSCULAR | Status: AC
Start: 2018-06-12 — End: 2018-06-12
  Administered 2018-06-12: 12.5 mg via INTRAVENOUS
  Filled 2018-06-12: qty 1

## 2018-06-12 MED ORDER — POTASSIUM CHLORIDE CRYS ER 20 MEQ PO TBCR
40.0000 meq | EXTENDED_RELEASE_TABLET | Freq: Once | ORAL | Status: AC
  Administered 2018-06-12: 40 meq via ORAL
  Filled 2018-06-12: qty 2

## 2018-06-12 MED ORDER — SODIUM CHLORIDE 0.9 % IV BOLUS
1000.0000 mL | Freq: Once | INTRAVENOUS | Status: AC
  Administered 2018-06-12: 1000 mL via INTRAVENOUS

## 2018-06-12 MED ORDER — ONDANSETRON HCL 4 MG PO TABS: 4.0000 mg | ORAL_TABLET | Freq: Four times a day (QID) | ORAL | 0 refills | Status: DC

## 2018-06-12 MED ORDER — ONDANSETRON HCL 4 MG/2ML IJ SOLN
4.0000 mg | Freq: Once | INTRAMUSCULAR | Status: AC
  Administered 2018-06-12: 4 mg via INTRAVENOUS
  Filled 2018-06-12: qty 2

## 2018-06-12 NOTE — ED Notes (Signed)
Dr. Charm Barges aware of the potassium level of 2.6.

## 2018-06-12 NOTE — ED Notes (Signed)
ED Provider at bedside. 

## 2018-06-12 NOTE — ED Notes (Signed)
Patient up to bedside commode with assistance

## 2018-06-12 NOTE — ED Triage Notes (Addendum)
Pt reports feeling weak and having no appetite since Friday. States she "can't poop". Also reports having cataract surgery last week. Pt alert and oriented. She c/o nausea and states she feels like she has gas pains

## 2018-06-12 NOTE — ED Provider Notes (Signed)
[facility] EMERGENCY DEPARTMENT Provider Note   CSN: [id number] Arrival date & time: 06/12/18  1722     History   Chief Complaint Chief Complaint  Patient presents with  . Fatigue    HPI Barbara Hill is a 77 y.o. female.  She is complaining of feeling nauseous and weak since Friday.  She has a discomfort in her upper abdomen that just does not go away.  She is caused her to not feel like she wants to eat.  She is also been feeling more constipated last 2 days.  sHe had cataract surgery last week but does not feel that has anything to do with this.  No fevers no chills no chest pain no shortness of breath no urinary symptoms.  There is been no vomiting.  No cough.  No new medications.  The history is provided by the patient.  Abdominal Pain   This is a new problem. The current episode started 2 days ago. The problem occurs constantly. The problem has not changed since onset.The pain is associated with an unknown factor. The pain is located in the epigastric region. The quality of the pain is aching. The pain is moderate. Associated symptoms include belching, nausea and constipation. Pertinent negatives include fever, diarrhea, hematochezia, melena, vomiting, dysuria, frequency, hematuria and headaches. Nothing aggravates the symptoms. Nothing relieves the symptoms.    Past Medical History:  Diagnosis Date  . Anxiety   . Brain aneurysm    pipeline stent 2017   . COPD (chronic obstructive pulmonary disease) (HCC)   . Fatigue   . H/O: hysterectomy   . Headache(784.0)   . History of cardiac catheterization    LHC 10/02: Normal coronary arteries  . History of nuclear stress test    Myoview 10/12: Normal stress nuclear study.  . Hypertension   . Mitral valve prolapse    Echo 8/17:Mild LVH, EF 60-65, normal wall motion, grade 2 diastolic dysfunction, mild AI, mild MR, mild LAE // echo 11/01: EF 60%, mild anterior MVP with trivial MR, PASP 35-40 mmHg, LAE  . Orthostasis     . PSVT (paroxysmal supraventricular tachycardia) (HCC)    palps  . Radiculopathy     Patient Active Problem List   Diagnosis Date Noted  . 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 Brain aneurysm 06/29/2016  . Lumbar contusion   . Orthostatic hypotension   . First degree AV block   . Hypokalemia   . Syncope 04/10/2016  . Paroxysmal SVT (supraventricular tachycardia) (HCC) 04/21/2011  . 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 Tobacco use disorder 04/21/2011    Past Surgical History:  Procedure Laterality Date  . ABDOMINAL HYSTERECTOMY    . APPENDECTOMY    . CARDIAC CATHETERIZATION  12/95  . CATARACT EXTRACTION    . CHOLECYSTECTOMY    . IR GENERIC HISTORICAL  06/02/2016   IR RADIOLOGIST EVAL & MGMT 06/02/2016 [facility]  . IR GENERIC HISTORICAL  06/29/2016   IR ANGIO INTRA EXTRACRAN SEL INTERNAL CAROTID UNI R MOD SED 06/29/2016 [name], MD [facility]  . IR GENERIC HISTORICAL  06/29/2016   IR ANGIOGRAM FOLLOW UP STUDY 06/29/2016 [name], MD [facility]  . IR GENERIC HISTORICAL  06/29/2016   IR NEURO EACH ADD'L AFTER BASIC UNI RIGHT (MS) 06/29/2016 [name], MD [facility]  . IR GENERIC HISTORICAL  06/29/2016   IR 3D INDEPENDENT WKST 06/29/2016 [name], MD [facility]  . IR GENERIC HISTORICAL  06/29/2016   IR  TRANSCATH/EMBOLIZ 06/29/2016 [name], MD [facility]  . IR GENERIC HISTORICAL  06/29/2016   IR ANGIO VERTEBRAL SEL SUBCLAVIAN INNOMINATE UNI R MOD SED 06/29/2016 [name], MD [facility]  . IR GENERIC HISTORICAL  07/28/2016   IR RADIOLOGIST EVAL & MGMT 07/28/2016 [facility]  . IR GENERIC HISTORICAL  11/12/2016   IR ANGIO INTRA EXTRACRAN SEL COM CAROTID INNOMINATE BILAT MOD SED 11/12/2016 [name], MD [facility]  . IR GENERIC HISTORICAL  11/12/2016   IR ANGIO VERTEBRAL SEL VERTEBRAL UNI L MOD SED 11/12/2016 [name], MD [facility]  . IR GENERIC HISTORICAL  11/12/2016   IR  ANGIO VERTEBRAL SEL SUBCLAVIAN INNOMINATE UNI R MOD SED 11/12/2016 [name], MD [facility]  . NASAL SEPTUM SURGERY    . RADIOLOGY WITH ANESTHESIA N/A 06/29/2016   Procedure: EMBOLIZATION;  Surgeon: [name], MD;  Location: [facility] OR;  Service: Radiology;  Laterality: N/A;  . ROTATOR CUFF REPAIR       OB History   None      Home Medications    Prior to Admission medications   Medication Sig Start Date End Date Taking? Authorizing Provider  acetaminophen (TYLENOL) 650 MG CR tablet Take 1,300 mg by mouth every 8 (eight) hours as needed for pain.    [provider]  acidophilus (RISAQUAD) CAPS capsule Take 1 capsule by mouth daily.    [provider]  ALPRAZolam [name]) 0.5 MG tablet Take 0.5 mg by mouth 3 (three) times daily.     [provider]  butalbital-acetaminophen-caffeine (FIORICET, ESGIC) 50-325-40 MG per tablet Take 1-2 tablets by mouth every 4 (four) hours as needed for headache or migraine.  07/06/14   [provider]  cholecalciferol (VITAMIN D) 1000 units tablet Take 1,000 Units by mouth daily.    [provider]  clopidogrel (PLAVIX) 75 MG tablet Take 75 mg by mouth daily. 06/18/16   [provider]  flecainide (TAMBOCOR) 50 MG tablet Take 1.5 tablets (75 mg total) by mouth 2 (two) times daily. 10/21/17   Pricilla Riffle, MD  FLUoxetine (PROZAC) 20 MG capsule Take 20 mg by mouth 2 (two) times daily. 06/07/16   [provider]  fluticasone (FLONASE) 50 MCG/ACT nasal spray Place 1 spray into both nostrils daily as needed for allergies or rhinitis.  10/11/14   [provider]  lactose free nutrition (BOOST) LIQD Take 237 mLs by mouth daily as needed (nutrional supplement).     [provider]  losartan (COZAAR) 25 MG tablet TAKE 1 TABLET BY MOUTH EVERY DAY 04/11/18   Tereso Newcomer T, PA-C  metoprolol succinate (TOPROL-XL) 25 MG 24 hr tablet TAKE 1 TABLET BY MOUTH AT BEDTIME 03/23/18   Pricilla Riffle, MD  omeprazole (PRILOSEC) 40 MG capsule Take 40 mg by mouth daily.      [provider]  potassium chloride SA (K-DUR,KLOR-CON) 20 MEQ tablet Take 1 tablet (20 mEq total) by mouth 2 (two) times daily. 05/17/18   Berton Bon, NP    Family History Family History  Problem Relation Age of Onset  . Heart attack Mother   . Transient ischemic attack Mother   . Heart attack Father 18  . Heart attack Other   . Asthma Other   . Heart failure Other   . Osteoporosis Other   . Heart Problems Brother   . CAD Unknown     Social History Social History   Tobacco Use  . Smoking status: Current Every Day  Smoker    Packs/day: 1.00    Years: 50.00    Pack years: 50.00    Types: Cigarettes  . Smokeless tobacco: Current User  Substance Use Topics  . Alcohol use: No  . Drug use: No     Allergies   Dobutamine; Epinephrine; Darvocet [propoxyphene n-acetaminophen]; Excedrin extra strength [aspirin-acetaminophen-caffeine]; Clarithromycin; Doxycycline; Penicillins; Prednisone; and Propoxyphene   Review of Systems Review of Systems  Constitutional: Negative for fever.  HENT: Negative for sore throat.   Eyes: Negative for visual disturbance.  Respiratory: Negative for shortness of breath.   Cardiovascular: Negative for chest pain.  Gastrointestinal: Positive for abdominal pain, constipation and nausea. Negative for diarrhea, hematochezia, melena and vomiting.  Genitourinary: Negative for dysuria, frequency and hematuria.  Musculoskeletal: Negative for back pain.  Skin: Negative for rash.  Neurological: Positive for weakness. Negative for headaches.     Physical Exam Updated Vital Signs BP (!) 192/85 (BP Location: Right Arm)   Pulse 68   Temp 98.7 F (37.1 C) (Oral)   Resp 18   Ht 5\' 3"  (1.6 m)   Wt 49 kg   SpO2 95%   BMI 19.13 kg/m   Physical Exam  Constitutional: She appears well-developed and well-nourished. No distress.  HENT:  Head: Normocephalic and  atraumatic.  Eyes: Conjunctivae are normal.  Neck: Neck supple.  Cardiovascular: Normal rate, regular rhythm and normal heart sounds.  No murmur heard. Pulmonary/Chest: Effort normal and breath sounds normal. No respiratory distress. She has no wheezes.  Abdominal: Soft. She exhibits no mass. There is no tenderness. There is no guarding.  Musculoskeletal: Normal range of motion. She exhibits no edema or deformity.  Neurological: She is alert.  Skin: Skin is warm and dry. Capillary refill takes less than 2 seconds.  Psychiatric: She has a normal mood and affect.  Nursing note and vitals reviewed.    ED Treatments / Results  Labs (all labs ordered are listed, but only abnormal results are displayed) Labs Reviewed  COMPREHENSIVE METABOLIC PANEL - Abnormal; Notable for the following components:      Result Value   Potassium 2.6 (*)    Chloride 96 (*)    Glucose, Bld 118 (*)    BUN 7 (*)    All other components within normal limits  URINALYSIS, ROUTINE W REFLEX MICROSCOPIC - Abnormal; Notable for the following components:   Hgb urine dipstick SMALL (*)    All other components within normal limits  CBC WITH DIFFERENTIAL/PLATELET - Abnormal; Notable for the following components:   RBC 3.80 (*)    Hemoglobin 11.1 (*)    HCT 34.4 (*)    All other components within normal limits  URINALYSIS, MICROSCOPIC (REFLEX) - Abnormal; Notable for the following components:   Bacteria, UA FEW (*)    All other components within normal limits  LIPASE, BLOOD  TROPONIN I  CBC WITH DIFFERENTIAL/PLATELET    EKG EKG Interpretation  Date/Time:  Sunday June 12 2018 19:31:49 EDT Ventricular Rate:  71 PR Interval:    QRS Duration: 134 QT Interval:  459 QTC Calculation: 499 R Axis:   -69 Text Interpretation:  sinus with 1st degree block Nonspecific IVCD with LAD LVH with secondary repolarization abnormality Baseline wander in lead(s) V2 similar to prior 8/17 Confirmed by Meridee Score [phone])  on 06/12/2018 7:39:23 PM   Radiology No results found.  Procedures Procedures (including critical care time)  Medications Ordered in ED Medications  sodium chloride 0.9 % bolus 1,000 mL (0 mLs Intravenous Stopped  06/12/18 2020)  ondansetron (ZOFRAN) injection 4 mg (4 mg Intravenous Given 06/12/18 1843)  potassium chloride 10 mEq in 100 mL IVPB (0 mEq Intravenous Stopped 06/12/18 2234)  potassium chloride SA (K-DUR,KLOR-CON) CR tablet 40 mEq (40 mEq Oral Given 06/12/18 1952)  promethazine (PHENERGAN) injection 12.5 mg (12.5 mg Intravenous Given 06/12/18 1946)     Initial Impression / Assessment and Plan / ED Course  I have reviewed the triage vital signs and the nursing notes.  Pertinent labs & imaging results that were available during my care of the patient were reviewed by me and considered in my medical decision making (see chart for details).  Clinical Course as of Jun 12 2310  Sun Jun 12, 2018  1776 77 year old female here with some vague upper abdominal discomfort and nausea.  Differential includes ACS, gastritis, pneumonia, urinary tract infection.  She is getting some screening labs and EKG chest x-ray.   [MB]  2106 I reviewed the patient and updated family about results of far.  She had some increased nausea after the Zofran so we gave her some Phenergan.  After that the patient became much more loopy.  Her nausea symptoms have improved though.  We are giving her IV and p.o. repletion of her low potassium.  Ultimately I think we would treat her for some gastritis and send her home with some Zofran.   [MB]  2230 Patient is received her potassium and her mental status seems to be back to normal.  Her daughter is comfortable taking her home and they will follow-up with her physician.  Daughter states the patient is on potassium supplements and she is supposed to take her potassium but she has not been taking it because she does not want to.    [MB]    Clinical Course User  Index [MB] Terrilee Files, MD     Final Clinical Impressions(s) / ED Diagnoses   Final diagnoses:  Hyperkalemia  Epigastric pain    ED Discharge Orders         Ordered    ondansetron (ZOFRAN) 4 MG tablet  Every 6 hours     06/12/18 2115           Terrilee Files, MD 06/12/18 2312

## 2018-06-12 NOTE — Discharge Instructions (Addendum)
You were evaluated for some upper abdominal pain and nausea.  You had lab work that showed that your potassium was very low.  You were given some potassium.  We are sending you home with some nausea medicine because that is been your main symptom.  It may be related to some stomach irritation or reflux.  Continue your acid medications and follow-up with your doctor.  Please return if any worsening symptoms.

## 2018-07-06 ENCOUNTER — Encounter: Payer: Self-pay | Admitting: Internal Medicine

## 2018-07-12 ENCOUNTER — Telehealth: Payer: Self-pay | Admitting: Student

## 2018-07-12 NOTE — Progress Notes (Signed)
Received request for prescription refill. Patient is currently taking Plavix 75 mg tablets once daily.  Faxed prescription refill request to [facility] [phone]) at [phone]- Plavix 75 mg tablets, take one tablet by mouth once daily, dispense 30 tablets with 3 refills.  Waylan Bogalexandra M Abbey Veith, PA-C 07/12/2018, 10:43 AM

## 2018-08-05 ENCOUNTER — Ambulatory Visit: Payer: Medicare HMO | Admitting: Internal Medicine

## 2018-08-05 DIAGNOSIS — R0989 Other specified symptoms and signs involving the circulatory and respiratory systems: Secondary | ICD-10-CM

## 2018-08-08 ENCOUNTER — Encounter: Payer: Self-pay | Admitting: Internal Medicine

## 2018-08-25 ENCOUNTER — Other Ambulatory Visit: Payer: Self-pay | Admitting: Family Medicine

## 2018-08-25 DIAGNOSIS — M546 Pain in thoracic spine: Secondary | ICD-10-CM

## 2018-09-07 ENCOUNTER — Ambulatory Visit
Admission: RE | Admit: 2018-09-07 | Discharge: 2018-09-07 | Disposition: A | Discharge status: Home / Self Care | Payer: Medicare HMO | Source: Ambulatory Visit | Attending: Family Medicine | Admitting: Family Medicine

## 2018-09-07 DIAGNOSIS — M546 Pain in thoracic spine: Secondary | ICD-10-CM

## 2018-10-12 ENCOUNTER — Other Ambulatory Visit: Payer: Self-pay | Admitting: Internal Medicine

## 2018-10-12 DIAGNOSIS — I10 Essential (primary) hypertension: Secondary | ICD-10-CM

## 2018-10-12 DIAGNOSIS — I471 Supraventricular tachycardia: Secondary | ICD-10-CM

## 2018-10-23 ENCOUNTER — Other Ambulatory Visit: Payer: Self-pay | Admitting: Internal Medicine

## 2018-10-31 ENCOUNTER — Telehealth: Payer: Self-pay | Admitting: Student

## 2018-10-31 NOTE — Telephone Encounter (Signed)
Faxed prescription refill request to [facility] [phone]) at [phone]- Plavix 75 mg tablets, take one tablet by mouth once daily, dispense 30 tablets with 3 refills.  Waylan Boga Rorey Bisson, PA-C 10/31/2018, 11:16 AM

## 2018-11-10 ENCOUNTER — Encounter: Payer: Self-pay | Admitting: Internal Medicine

## 2018-11-16 ENCOUNTER — Telehealth: Payer: Self-pay | Admitting: Internal Medicine

## 2018-11-16 NOTE — Telephone Encounter (Signed)
Contacted patient    She is doing good Given corona virus pandemic will reschedule at end of summer   Call with problems

## 2018-11-17 NOTE — Telephone Encounter (Signed)
Cancelled upcoming appointment, will send to C19 r/s pool to plan follow up 

## 2018-11-21 ENCOUNTER — Ambulatory Visit: Payer: Medicare HMO | Admitting: Internal Medicine

## 2018-11-28 ENCOUNTER — Other Ambulatory Visit: Payer: Self-pay | Admitting: Physician Assistant

## 2018-11-28 DIAGNOSIS — I10 Essential (primary) hypertension: Secondary | ICD-10-CM

## 2018-12-01 ENCOUNTER — Telehealth: Payer: Self-pay

## 2018-12-01 NOTE — Telephone Encounter (Signed)
Pts March appt canceled d/t COVID. Rescheduled pt for Aug 17 OV with Dr. Tenny Craw.

## 2018-12-05 ENCOUNTER — Telehealth (HOSPITAL_COMMUNITY): Payer: Self-pay

## 2018-12-05 ENCOUNTER — Telehealth: Payer: Self-pay | Admitting: Student

## 2018-12-05 NOTE — Telephone Encounter (Signed)
-----   Message from Anderson Malta sent at 12/05/2018  3:00 PM EDT ----- Regarding: Plavix refill Lannette Donath,   Patient called, she is out of her Plavix. She said the pharmacy has been trying to get in touch but i'm not sure who they've called. I told her I would have you call in a refill. [facility] [place]. I've confirmed this with her already.   Thanks,  Fara Boros

## 2018-12-05 NOTE — Telephone Encounter (Signed)
Pt called for a refill on her Plavix. She is almost out. I have confirmed the pharmacy info and given it to our PA to call in. Pt agreed with this plan. AW

## 2018-12-05 NOTE — Telephone Encounter (Signed)
Received request for Plavix refill.  Prescription was sent in for patient 3/9 with 3 refills.  Called pharmacy to discuss and update prescription.  Attempted to call patient to inform her prescription had been called in, however call would not go through at home phone and cell phone number listed was not accurate.   Loyce Dys, MS RD PA-C 4:29 PM

## 2019-01-12 ENCOUNTER — Telehealth (HOSPITAL_COMMUNITY): Payer: Self-pay

## 2019-01-12 NOTE — Telephone Encounter (Signed)
Called to schedule f/u cta head/neck, no answer, left vm. AW 

## 2019-02-16 ENCOUNTER — Telehealth (HOSPITAL_COMMUNITY): Payer: Self-pay

## 2019-02-16 NOTE — Telephone Encounter (Signed)
Called to schedule f/u cta, no answer, left vm. AW  

## 2019-02-20 ENCOUNTER — Other Ambulatory Visit: Payer: Self-pay | Admitting: Internal Medicine

## 2019-02-20 DIAGNOSIS — I10 Essential (primary) hypertension: Secondary | ICD-10-CM

## 2019-02-20 DIAGNOSIS — I471 Supraventricular tachycardia: Secondary | ICD-10-CM

## 2019-03-09 ENCOUNTER — Other Ambulatory Visit: Payer: Self-pay | Admitting: Internal Medicine

## 2019-03-23 ENCOUNTER — Telehealth (HOSPITAL_COMMUNITY): Payer: Self-pay

## 2019-03-23 NOTE — Telephone Encounter (Signed)
Called to schedule f/u cta. Pt does not want to schedule at this time because her daughter is sick and she will not have a ride. She will give [place] a call back when she is able to come in. AW

## 2019-04-09 NOTE — Progress Notes (Signed)
Cardiology Office Note   Date:  04/10/2019   ID:  Barbara Hill, DOB Mar 21, 1941, MRN [id number]  PCP:  Aletha Halim., PA-C  Cardiologist:   Dorris Carnes, MD   F/U of orthostasis and HTN    History of Present Illness: Barbara Hill is a 78 y.o. female with a history of orthostatic intolerance and hypertension, SVT, palpitations.  Syncopal spell in 2017  She was seen by Kathleen Argue in 2019   Started on Mestinon which helped her symptoms  She was last seen in cardiology by Buren Kos in 2019   The pt says she has not been very dizzy   Denies syncope She denies palpitations Breathing is OK  The patient is unclear on her medicines   Not sure what Mestinon is    Current Meds  Medication Sig  . acetaminophen (TYLENOL) 650 MG CR tablet Take 1,300 mg by mouth every 8 (eight) hours as needed for pain.  Marland Kitchen acidophilus (RISAQUAD) CAPS capsule Take 1 capsule by mouth daily.  Marland Kitchen ALPRAZolam (XANAX) 0.5 MG tablet Take 0.5 mg by mouth 3 (three) times daily.   . butalbital-acetaminophen-caffeine (FIORICET, ESGIC) 50-325-40 MG per tablet Take 1-2 tablets by mouth every 4 (four) hours as needed for headache or migraine.   . cholecalciferol (VITAMIN D) 1000 units tablet Take 1,000 Units by mouth daily.  . clopidogrel (PLAVIX) 75 MG tablet Take 75 mg by mouth daily.  Marland Kitchen FLUoxetine (PROZAC) 20 MG capsule Take 20 mg by mouth 2 (two) times daily.  . fluticasone (FLONASE) 50 MCG/ACT nasal spray Place 1 spray into both nostrils daily as needed for allergies or rhinitis.   Marland Kitchen lactose free nutrition (BOOST) LIQD Take 237 mLs by mouth daily as needed (nutrional supplement).   . losartan (COZAAR) 25 MG tablet TAKE 1 TABLET BY MOUTH EVERY DAY  . metoprolol succinate (TOPROL-XL) 25 MG 24 hr tablet TAKE 1 TABLET DAILY.  Marland Kitchen omeprazole (PRILOSEC) 40 MG capsule Take 40 mg by mouth daily.    . potassium chloride SA (K-DUR,KLOR-CON) 20 MEQ tablet Take 1 tablet (20 mEq total) by mouth 2 (two) times daily.     Allergies:    Dobutamine, Epinephrine, Darvocet [propoxyphene n-acetaminophen], Excedrin extra strength [aspirin-acetaminophen-caffeine], Aspirin, Clarithromycin, Doxycycline, Penicillins, Prednisone, and Propoxyphene   Past Medical History:  Diagnosis Date  . Anxiety   . Brain aneurysm    pipeline stent 2017   . COPD (chronic obstructive pulmonary disease) ([place])   . Fatigue   . H/O: hysterectomy   . Headache(784.0)   . History of cardiac catheterization    LHC 10/02: Normal coronary arteries  . History of nuclear stress test    Myoview 10/12: Normal stress nuclear study.  . Hypertension   . Mitral valve prolapse    Echo 8/17:Mild LVH, EF 60-65, normal wall motion, grade 2 diastolic dysfunction, mild AI, mild MR, mild LAE // echo 11/01: EF 60%, mild anterior MVP with trivial MR, PASP 35-40 mmHg, LAE  . Orthostasis   . PSVT (paroxysmal supraventricular tachycardia) (HCC)    palps  . Radiculopathy     Past Surgical History:  Procedure Laterality Date  . ABDOMINAL HYSTERECTOMY    . APPENDECTOMY    . CARDIAC CATHETERIZATION  12/95  . CATARACT EXTRACTION    . CHOLECYSTECTOMY    . IR GENERIC HISTORICAL  06/02/2016   IR RADIOLOGIST EVAL & MGMT 06/02/2016 [facility]  . IR GENERIC HISTORICAL  06/29/2016   IR ANGIO INTRA EXTRACRAN SEL INTERNAL  CAROTID UNI R MOD SED 06/29/2016 Julieanne CottonSanjeev Deveshwar, MD [facility]  . IR GENERIC HISTORICAL  06/29/2016   IR ANGIOGRAM FOLLOW UP STUDY 06/29/2016 Julieanne CottonSanjeev Deveshwar, MD [facility]  . IR GENERIC HISTORICAL  06/29/2016   IR NEURO EACH ADD'L AFTER BASIC UNI RIGHT (MS) 06/29/2016 Julieanne CottonSanjeev Deveshwar, MD [facility]  . IR GENERIC HISTORICAL  06/29/2016   IR 3D INDEPENDENT WKST 06/29/2016 Julieanne CottonSanjeev Deveshwar, MD [facility]  . IR GENERIC HISTORICAL  06/29/2016   IR TRANSCATH/EMBOLIZ 06/29/2016 Julieanne CottonSanjeev Deveshwar, MD [facility]  . IR GENERIC HISTORICAL  06/29/2016   IR ANGIO VERTEBRAL SEL SUBCLAVIAN INNOMINATE UNI R MOD SED 06/29/2016 Julieanne CottonSanjeev Deveshwar, MD  [facility]  . IR GENERIC HISTORICAL  07/28/2016   IR RADIOLOGIST EVAL & MGMT 07/28/2016 [facility]  . IR GENERIC HISTORICAL  11/12/2016   IR ANGIO INTRA EXTRACRAN SEL COM CAROTID INNOMINATE BILAT MOD SED 11/12/2016 Julieanne CottonSanjeev Deveshwar, MD [facility]  . IR GENERIC HISTORICAL  11/12/2016   IR ANGIO VERTEBRAL SEL VERTEBRAL UNI L MOD SED 11/12/2016 Julieanne CottonSanjeev Deveshwar, MD [facility]  . IR GENERIC HISTORICAL  11/12/2016   IR ANGIO VERTEBRAL SEL SUBCLAVIAN INNOMINATE UNI R MOD SED 11/12/2016 Julieanne CottonSanjeev Deveshwar, MD [facility]  . NASAL SEPTUM SURGERY    . RADIOLOGY WITH ANESTHESIA N/A 06/29/2016   Procedure: EMBOLIZATION;  Surgeon: Julieanne CottonSanjeev Deveshwar, MD;  Location: [facility] OR;  Service: Radiology;  Laterality: N/A;  . ROTATOR CUFF REPAIR       Social History:  The patient  reports that she has been smoking cigarettes. She has a 50.00 pack-year smoking history. She uses smokeless tobacco. She reports that she does not drink alcohol or use drugs.   Family History:  The patient's family history includes Asthma in an other family member; CAD in an other family member; Heart Problems in her brother; Heart attack in her mother and another family member; Heart attack (age of onset: 6180) in her father; Heart failure in an other family member; Osteoporosis in an other family member; Transient ischemic attack in her mother.    ROS:  Please see the history of present illness. All other systems are reviewed and  Negative to the above problem except as noted.    PHYSICAL EXAM: VS:  BP 106/64   Pulse (!) 103   Ht 5\' 3"  (1.6 m)   Wt 105 lb 1.9 oz (47.7 kg)   BMI 18.62 kg/m   GEN: Thin 78 yo, in no acute distress  HEENT: normal  Neck: no JVD, carotid bruits, or masses Cardiac: RRR; no murmurs, rubs, or gallops,no edema  Respiratory:  clear to auscultation bilaterally, normal work of breathing GI: soft, nontender, nondistended, + BS  No hepatomegaly  MS: no deformity Moving all extremities   Skin: warm and  dry, no rash Neuro:  Strength and sensation are intact Psych: euthymic mood, full affect   EKG:  EKG is not ordered today.   Lipid Panel    Component Value Date/Time   CHOL 188 07/24/2016 1125   TRIG 120 07/24/2016 1125   HDL 65 07/24/2016 1125   CHOLHDL 2.9 07/24/2016 1125   VLDL 24 07/24/2016 1125   LDLCALC 99 07/24/2016 1125      Wt Readings from Last 3 Encounters:  04/10/19 105 lb 1.9 oz (47.7 kg)  06/12/18 108 lb (49 kg)  05/17/18 109 lb 12.8 oz (49.8 kg)      ASSESSMENT AND PLAN:  1  HTN  BP is 106 today  2  Orthostatic intolerance  She denies dizziness   Not clear of meds   WIll check with pharmacy   WIll check labs    3  Palpitations  Hx SVT   Has been on flecanide  Denies plapitations   Plan for f/u in 9 months     Current medicines are reviewed at length with the patient today.  The patient does not have concerns regarding medicines.  Signed, Dietrich PatesPaula Jaliza Seifried, MD  04/10/2019 10:02 AM    [facility] [address], [place], [place]  [address] Phone: [phone]; Fax: [phone]

## 2019-04-10 ENCOUNTER — Ambulatory Visit (INDEPENDENT_AMBULATORY_CARE_PROVIDER_SITE_OTHER): Payer: Medicare HMO | Admitting: Internal Medicine

## 2019-04-10 ENCOUNTER — Other Ambulatory Visit: Payer: Self-pay

## 2019-04-10 ENCOUNTER — Encounter: Payer: Self-pay | Admitting: Internal Medicine

## 2019-04-10 VITALS — BP 106/64 | HR 103 | Ht 63.0 in | Wt 105.1 lb

## 2019-04-10 DIAGNOSIS — R002 Palpitations: Secondary | ICD-10-CM

## 2019-04-10 DIAGNOSIS — I10 Essential (primary) hypertension: Secondary | ICD-10-CM | POA: Diagnosis not present

## 2019-04-10 DIAGNOSIS — Z79899 Other long term (current) drug therapy: Secondary | ICD-10-CM | POA: Diagnosis not present

## 2019-04-10 LAB — BASIC METABOLIC PANEL
BUN/Creatinine Ratio: 15 (ref 12–28)
BUN: 19 mg/dL (ref 8–27)
CO2: 24 mmol/L (ref 20–29)
Calcium: 9.5 mg/dL (ref 8.7–10.3)
Chloride: 102 mmol/L (ref 96–106)
Creatinine, Ser: 1.29 mg/dL — ABNORMAL HIGH (ref 0.57–1.00)
GFR calc Af Amer: 46 mL/min/{1.73_m2} — ABNORMAL LOW (ref >59)
GFR calc non Af Amer: 40 mL/min/{1.73_m2} — ABNORMAL LOW (ref >59)
Glucose: 83 mg/dL (ref 65–99)
Potassium: 4.7 mmol/L (ref 3.5–5.2)
Sodium: 139 mmol/L (ref 134–144)

## 2019-04-10 LAB — LIPID PANEL
Chol/HDL Ratio: 2.2 ratio (ref 0.0–4.4)
Cholesterol, Total: 148 mg/dL (ref 100–199)
HDL: 68 mg/dL (ref >39)
LDL Calculated: 66 mg/dL (ref 0–99)
Triglycerides: 71 mg/dL (ref 0–149)
VLDL Cholesterol Cal: 14 mg/dL (ref 5–40)

## 2019-04-10 LAB — CBC
Hematocrit: 31.9 % — ABNORMAL LOW (ref 34.0–46.6)
Hemoglobin: 10.6 g/dL — ABNORMAL LOW (ref 11.1–15.9)
MCH: 30.1 pg (ref 26.6–33.0)
MCHC: 33.2 g/dL (ref 31.5–35.7)
MCV: 91 fL (ref 79–97)
Platelets: 308 10*3/uL (ref 150–450)
RBC: 3.52 x10E6/uL — ABNORMAL LOW (ref 3.77–5.28)
RDW: 11.3 % — ABNORMAL LOW (ref 11.7–15.4)
WBC: 6.6 10*3/uL (ref 3.4–10.8)

## 2019-04-10 NOTE — Patient Instructions (Signed)
Medication Instructions:  No changes today If you need a refill on your cardiac medications before your next appointment, please call your pharmacy.   Lab work: Today: cbc, lipids, bmet If you have labs (blood work) drawn today and your tests are completely normal, you will receive your results only by: Marland Kitchen MyChart Message (if you have MyChart) OR . A paper copy in the mail If you have any lab test that is abnormal or we need to change your treatment, we will call you to review the results.  Testing/Procedures: none  Follow-Up: At [facility], you and your health needs are our priority.  As part of our continuing mission to provide you with exceptional heart care, we have created designated Provider Care Teams.  These Care Teams include your primary Cardiologist (physician) and Advanced Practice Providers (APPs -  Physician Assistants and Nurse Practitioners) who all work together to provide you with the care you need, when you need it. You will need a follow up appointment in:  9 months.  Please call our office 2 months in advance to schedule this appointment.  You may see Dorris Carnes, MD or one of the following Advanced Practice Providers on your designated Care Team: Richardson Dopp, PA-C [place], [place] . Daune Perch, NP  Any Other Special Instructions Will Be Listed Below (If Applicable).

## 2019-04-24 ENCOUNTER — Other Ambulatory Visit: Payer: Self-pay | Admitting: Internal Medicine

## 2019-05-04 ENCOUNTER — Other Ambulatory Visit: Payer: Self-pay | Admitting: Internal Medicine

## 2019-05-05 ENCOUNTER — Telehealth: Payer: Self-pay | Admitting: Internal Medicine

## 2019-05-05 NOTE — Telephone Encounter (Signed)
Pt calling requesting a refill on flecanide. This medication was D/C. I do not see where Dr. Harrington Challenger D/C this medication, but it is no longer on pt's medication list. Pt states that she has been taken this medication for a long time and does not know why Dr. Harrington Challenger will not refill her medication. Please address

## 2019-05-05 NOTE — Telephone Encounter (Signed)
I spoke to the patient who called to get a refill on Flecainide.  According to the [facility] [place] on 8/17, the Flecainide was D/C (patient preference).    Need to know if patient should continue, somehow was taken off the list.  Patient has enough for 4 days and states " she cannot go without it."  Thank you

## 2019-05-08 MED ORDER — FLECAINIDE ACETATE 50 MG PO TABS: 75.0000 mg | ORAL_TABLET | Freq: Two times a day (BID) | ORAL | 3 refills | Status: DC

## 2019-05-08 NOTE — Telephone Encounter (Signed)
No changes to medications were made at last office visit. Reviewed with Dr. Harrington Challenger.  Pt to continue flecainide 75 mg twice daily as she has been taking. Called patient and informed. She will pick up.

## 2019-05-15 ENCOUNTER — Telehealth: Payer: Self-pay | Admitting: Student

## 2019-05-15 NOTE — Telephone Encounter (Signed)
NIR.  Received message from patient requesting Plavix refill.  Called [facility] in [place], [place] [phone]) at [phone] to fill prescription- Plavix 75 mg tablets, take one tablet by mouth once daily, dispense 30 tablets with 3 refills.   Bea Graff Louk, PA-C 05/15/2019, 12:38 PM

## 2019-05-30 ENCOUNTER — Other Ambulatory Visit: Payer: Self-pay | Admitting: Internal Medicine

## 2019-05-30 DIAGNOSIS — I10 Essential (primary) hypertension: Secondary | ICD-10-CM

## 2019-05-30 DIAGNOSIS — I471 Supraventricular tachycardia: Secondary | ICD-10-CM

## 2019-06-28 ENCOUNTER — Telehealth (HOSPITAL_COMMUNITY): Payer: Self-pay

## 2019-06-28 NOTE — Telephone Encounter (Signed)
Pt called wanting to know when she had her aneurysm treated. Informed her that her procedure was 06/29/16. She was very thankful. She will call back to schedule a f/u at a later date. AW

## 2019-07-03 ENCOUNTER — Telehealth: Payer: Self-pay | Admitting: Student

## 2019-07-03 NOTE — Telephone Encounter (Signed)
NIR.  Received message from patient requesting Plavix refill.  Called [facility] in [place], [place] [phone]) at [phone] to fill prescription- Plavix 75 mg tablets, take one tablet by mouth once daily, dispense 30 tablets with 3 refills.   Bea Graff Louk, PA-C 07/03/2019, 3:59 PM

## 2019-10-02 ENCOUNTER — Telehealth: Payer: Self-pay | Admitting: Internal Medicine

## 2019-10-02 DIAGNOSIS — I471 Supraventricular tachycardia: Secondary | ICD-10-CM

## 2019-10-02 DIAGNOSIS — I10 Essential (primary) hypertension: Secondary | ICD-10-CM

## 2019-10-02 MED ORDER — METOPROLOL SUCCINATE ER 25 MG PO TB24: ORAL_TABLET | ORAL | 2 refills | Status: DC

## 2019-10-02 NOTE — Telephone Encounter (Signed)
Pt's medication was sent to pt's pharmacy as requested. Confirmation received.  °

## 2019-10-02 NOTE — Telephone Encounter (Signed)
*  STAT* If patient is at the pharmacy, call can be transferred to refill team.   1. Which medications need to be refilled? (please list name of each medication and dose if known) metoprolol succinate (TOPROL-XL) 25 MG 24 hr tablet  2. Which pharmacy/location (including street and city if local pharmacy) is medication to be sent to? [id number] - [place], [place] - [address]  3. Do they need a 30 day or 90 day supply? 30 day

## 2019-10-04 ENCOUNTER — Telehealth: Payer: Self-pay | Admitting: Student

## 2019-10-04 NOTE — Telephone Encounter (Signed)
NIR.  Received prescription refill request for Plavix. Faxed filled prescription to [facility] [phone]) at [phone]- Plavix 75 mg tablets, take one tablet by mouth once daily, dispense 30 tablets with 3 refills.   Waylan Boga Myron Lona, PA-C 10/04/2019, 10:09 AM

## 2019-10-11 ENCOUNTER — Telehealth: Payer: Self-pay | Admitting: Internal Medicine

## 2019-10-11 NOTE — Telephone Encounter (Signed)
  Patient c/o Palpitations:  High priority if patient c/o lightheadedness, shortness of breath, or chest pain  1) How long have you had palpitations/irregular HR/ Afib? Are you having the symptoms now? Heart skipping for the past 3 or 4 days, no  2) Are you currently experiencing lightheadedness, SOB or CP? no  3) Do you have a history of afib (atrial fibrillation) or irregular heart rhythm? yes  4) Have you checked your BP or HR? (document readings if available): no  5) Are you experiencing any other symptoms? Left shoulder hurts  Patient states for the past 3 or 4 days her heart has been skipping beats. She states she is not having any chest pain or SOB, but her left shoulder hurts. Please advise.

## 2019-10-11 NOTE — Telephone Encounter (Signed)
Last night laying down could feel heart skipping and then got dull aching down left arm.  Lasted about 20 min.  No chest pain.  She swallowed 4 baby aspirn  And 1/4 of xanax 0.5 mg and the skipping and the left arm pain resolved.   Had already taken metoprolol and flecainide for the evening.  There was no SOB or nausea, no clamminess,  It made her anxious.     She stopped potassium several months ago because it gave her bad heartburn.    Her significant other, also Dr. Charlott Rakes patient has COPD and is requiring a lot of care, attention.  I scheduled her for a telephone visit with Dr. Tenny Craw tomorrow morning and gave instructions for calling EMS if symptoms recur.      Virtual Visit Pre-Appointment Phone Call  "(Name), I am calling you today to discuss your upcoming appointment. We are currently trying to limit exposure to the virus that causes COVID-19 by seeing patients at home rather than in the office."  1. "What is the BEST phone number to call the day of the visit?" - include this in appointment notes  2. "Do you have or have access to (through a family member/friend) a smartphone with video capability that we can use for your visit?" a. If yes - list this number in appt notes as "cell" (if different from BEST phone #) and list the appointment type as a VIDEO visit in appointment notes b. If no - list the appointment type as a PHONE visit in appointment notes  3. Confirm consent - "In the setting of the current Covid19 crisis, you are scheduled for a (phone or video) visit with your provider on (date) at (time).  Just as we do with many in-office visits, in order for you to participate in this visit, we must obtain consent.  If you'd like, I can send this to your mychart (if signed up) or email for you to review.  Otherwise, I can obtain your verbal consent now.  All virtual visits are billed to your insurance company just like a normal visit would be.  By agreeing to a virtual visit, we'd  like you to understand that the technology does not allow for your provider to perform an examination, and thus may limit your provider's ability to fully assess your condition. If your provider identifies any concerns that need to be evaluated in person, we will make arrangements to do so.  Finally, though the technology is pretty good, we cannot assure that it will always work on either your or our end, and in the setting of a video visit, we may have to convert it to a phone-only visit.  In either situation, we cannot ensure that we have a secure connection.  Are you willing to proceed?" STAFF: Did the patient verbally acknowledge consent to telehealth visit? Document YES/NO here: YES  4. Advise patient to be prepared - "Two hours prior to your appointment, go ahead and check your blood pressure, pulse, oxygen saturation, and your weight (if you have the equipment to check those) and write them all down. When your visit starts, your provider will ask you for this information. If you have an Apple Watch or Kardia device, please plan to have heart rate information ready on the day of your appointment. Please have a pen and paper handy nearby the day of the visit as well."  5. Give patient instructions for MyChart download to smartphone OR Doximity/Doxy.me as below if video  visit (depending on what platform provider is using)  6. Inform patient they will receive a phone call 15 minutes prior to their appointment time (may be from unknown caller ID) so they should be prepared to answer    TELEPHONE CALL NOTE  TRESSA MALDONADO has been deemed a candidate for a follow-up tele-health visit to limit community exposure during the Covid-19 pandemic. I spoke with the patient via phone to ensure availability of phone/video source, confirm preferred email & phone number, and discuss instructions and expectations.  I reminded Barbara Hill to be prepared with any vital sign and/or heart rhythm information that could  potentially be obtained via home monitoring, at the time of her visit. I reminded Barbara Hill to expect a phone call prior to her visit.  Rodman Key, RN 10/11/2019 4:27 PM   INSTRUCTIONS FOR DOWNLOADING THE MYCHART APP TO SMARTPHONE  - The patient must first make sure to have activated MyChart and know their login information - If Apple, go to [employer] and type in MyChart in the search bar and download the app. If Android, ask patient to go to [employer] and type in [place] in the search bar and download the app. The app is free but as with any other app downloads, their phone may require them to verify saved payment information or Apple/Android password.  - The patient will need to then log into the app with their MyChart username and password, and select [facility] as their healthcare provider to link the account. When it is time for your visit, go to the MyChart app, find appointments, and click Begin Video Visit. Be sure to Select Allow for your device to access the Microphone and Camera for your visit. You will then be connected, and your provider will be with you shortly.  **If they have any issues connecting, or need assistance please contact MyChart service desk [phone])**  **If using a computer, in order to ensure the best quality for their visit they will need to use either of the following Internet Browsers: [employer], or Google Chrome**  IF USING DOXIMITY or DOXY.ME - The patient will receive a link just prior to their visit by text.     FULL LENGTH CONSENT FOR TELE-HEALTH VISIT   I hereby voluntarily request, consent and authorize [facility] and its employed or contracted physicians, physician assistants, nurse practitioners or other licensed health care professionals (the Practitioner), to provide me with telemedicine health care services (the "Services") as deemed necessary by the treating Practitioner. I acknowledge and consent to  receive the Services by the Practitioner via telemedicine. I understand that the telemedicine visit will involve communicating with the Practitioner through live audiovisual communication technology and the disclosure of certain medical information by electronic transmission. I acknowledge that I have been given the opportunity to request an in-person assessment or other available alternative prior to the telemedicine visit and am voluntarily participating in the telemedicine visit.  I understand that I have the right to withhold or withdraw my consent to the use of telemedicine in the course of my care at any time, without affecting my right to future care or treatment, and that the Practitioner or I may terminate the telemedicine visit at any time. I understand that I have the right to inspect all information obtained and/or recorded in the course of the telemedicine visit and may receive copies of available information for a reasonable fee.  I understand that some of the  potential risks of receiving the Services via telemedicine include:  Marland Kitchen Delay or interruption in medical evaluation due to technological equipment failure or disruption; . Information transmitted may not be sufficient (e.g. poor resolution of images) to allow for appropriate medical decision making by the Practitioner; and/or  . In rare instances, security protocols could fail, causing a breach of personal health information.  Furthermore, I acknowledge that it is my responsibility to provide information about my medical history, conditions and care that is complete and accurate to the best of my ability. I acknowledge that Practitioner's advice, recommendations, and/or decision may be based on factors not within their control, such as incomplete or inaccurate data provided by me or distortions of diagnostic images or specimens that may result from electronic transmissions. I understand that the practice of medicine is not an exact science  and that Practitioner makes no warranties or guarantees regarding treatment outcomes. I acknowledge that I will receive a copy of this consent concurrently upon execution via email to the email address I last provided but may also request a printed copy by calling the office of [facility].    I understand that my insurance will be billed for this visit.   I have read or had this consent read to me. . I understand the contents of this consent, which adequately explains the benefits and risks of the Services being provided via telemedicine.  . I have been provided ample opportunity to ask questions regarding this consent and the Services and have had my questions answered to my satisfaction. . I give my informed consent for the services to be provided through the use of telemedicine in my medical care  By participating in this telemedicine visit I agree to the above.

## 2019-10-12 ENCOUNTER — Telehealth: Payer: Self-pay | Admitting: *Deleted

## 2019-10-12 ENCOUNTER — Telehealth (INDEPENDENT_AMBULATORY_CARE_PROVIDER_SITE_OTHER): Payer: Medicare Other | Admitting: Internal Medicine

## 2019-10-12 ENCOUNTER — Other Ambulatory Visit: Payer: Self-pay

## 2019-10-12 DIAGNOSIS — R002 Palpitations: Secondary | ICD-10-CM

## 2019-10-12 MED ORDER — FLECAINIDE ACETATE 50 MG PO TABS: 50.0000 mg | ORAL_TABLET | Freq: Two times a day (BID) | ORAL | 3 refills | Status: DC

## 2019-10-12 NOTE — Progress Notes (Signed)
Virtual Visit via Telephone Note   This visit type was conducted due to national recommendations for restrictions regarding the COVID-19 Pandemic (e.g. social distancing) in an effort to limit this patient's exposure and mitigate transmission in our community.  Due to her co-morbid illnesses, this patient is at least at moderate risk for complications without adequate follow up.  This format is felt to be most appropriate for this patient at this time.  The patient did not have access to video technology/had technical difficulties with video requiring transitioning to audio format only (telephone).  All issues noted in this document were discussed and addressed.  No physical exam could be performed with this format.  Please refer to the patient's chart for her  consent to telehealth for [facility].   Date:  10/12/2019   ID:  Barbara Hill, DOB Jul 08, 1941, MRN [id number]  Patient Location: Home Provider Location: Home  PCP:  Richmond Campbell., PA-C  Cardiologist:  Dietrich Pates, MD    Evaluation Performed:  Follow-Up Visit  Chief Complaint:  Palpitations and arm discomfort  History of Present Illness:    Barbara Hill is a 79 y.o. female with a hx of orthostatic intolerance, HTN, SVT, palpitations   Syncopal spell in 2017   I saw her in clinic in August 2020  The pt called in yesterday   She had a spell Wed where she felt her heart skipping and then developed an aching in L arm  No CP   Lasted about 20 min  TOok ASA and Xanax. She called in yesterday to review   Has not had an episode since Wed.     Breathing is OK  No CP   ANxious about weather  Note she also says that hse was taking Pseudophed last week  The patient does not have symptoms concerning for COVID-19 infection (fever, chills, cough, or new shortness of breath).    Past Medical History:  Diagnosis Date  . Anxiety   . Brain aneurysm    pipeline stent 2017   . COPD (chronic obstructive pulmonary disease) (HCC)   .  Fatigue   . H/O: hysterectomy   . Headache(784.0)   . History of cardiac catheterization    LHC 10/02: Normal coronary arteries  . History of nuclear stress test    Myoview 10/12: Normal stress nuclear study.  . Hypertension   . Mitral valve prolapse    Echo 8/17:Mild LVH, EF 60-65, normal wall motion, grade 2 diastolic dysfunction, mild AI, mild MR, mild LAE // echo 11/01: EF 60%, mild anterior MVP with trivial MR, PASP 35-40 mmHg, LAE  . Orthostasis   . PSVT (paroxysmal supraventricular tachycardia) (HCC)    palps  . Radiculopathy    Past Surgical History:  Procedure Laterality Date  . ABDOMINAL HYSTERECTOMY    . APPENDECTOMY    . CARDIAC CATHETERIZATION  12/95  . CATARACT EXTRACTION    . CHOLECYSTECTOMY    . IR GENERIC HISTORICAL  06/02/2016   IR RADIOLOGIST EVAL & MGMT 06/02/2016 [facility]  . IR GENERIC HISTORICAL  06/29/2016   IR ANGIO INTRA EXTRACRAN SEL INTERNAL CAROTID UNI R MOD SED 06/29/2016 [name], MD [facility]  . IR GENERIC HISTORICAL  06/29/2016   IR ANGIOGRAM FOLLOW UP STUDY 06/29/2016 [name], MD [facility]  . IR GENERIC HISTORICAL  06/29/2016   IR NEURO EACH ADD'L AFTER BASIC UNI RIGHT (MS) 06/29/2016 [name], MD [facility]  . IR GENERIC HISTORICAL  06/29/2016  IR 3D INDEPENDENT WKST 06/29/2016 [name], MD [facility]  . IR GENERIC HISTORICAL  06/29/2016   IR TRANSCATH/EMBOLIZ 06/29/2016 [name], MD [facility]  . IR GENERIC HISTORICAL  06/29/2016   IR ANGIO VERTEBRAL SEL SUBCLAVIAN INNOMINATE UNI R MOD SED 06/29/2016 [name], MD [facility]  . IR GENERIC HISTORICAL  07/28/2016   IR RADIOLOGIST EVAL & MGMT 07/28/2016 [facility]  . IR GENERIC HISTORICAL  11/12/2016   IR ANGIO INTRA EXTRACRAN SEL COM CAROTID INNOMINATE BILAT MOD SED 11/12/2016 [name], MD [facility]  . IR GENERIC HISTORICAL  11/12/2016   IR ANGIO VERTEBRAL SEL VERTEBRAL UNI L MOD SED 11/12/2016 [name], MD  [facility]  . IR GENERIC HISTORICAL  11/12/2016   IR ANGIO VERTEBRAL SEL SUBCLAVIAN INNOMINATE UNI R MOD SED 11/12/2016 [name], MD [facility]  . NASAL SEPTUM SURGERY    . RADIOLOGY WITH ANESTHESIA N/A 06/29/2016   Procedure: EMBOLIZATION;  Surgeon: [name], MD;  Location: [facility];  Service: Radiology;  Laterality: N/A;  . ROTATOR CUFF REPAIR       Current Meds  Medication Sig  . acetaminophen (TYLENOL) 650 MG CR tablet Take 1,300 mg by mouth every 8 (eight) hours as needed for pain.  Marland Kitchen ALPRAZolam (XANAX) 0.5 MG tablet Take 0.5 mg by mouth 3 (three) times daily.   . butalbital-acetaminophen-caffeine (FIORICET, ESGIC) 50-325-40 MG per tablet Take 1-2 tablets by mouth every 4 (four) hours as needed for headache or migraine.   . cholecalciferol (VITAMIN D) 1000 units tablet Take 1,000 Units by mouth daily.  . clopidogrel (PLAVIX) 75 MG tablet Take 75 mg by mouth daily.  . flecainide (TAMBOCOR) 50 MG tablet Take 1.5 tablets (75 mg total) by mouth 2 (two) times daily.  Marland Kitchen FLUoxetine (PROZAC) 20 MG capsule Take 20 mg by mouth 2 (two) times daily.  . fluticasone (FLONASE) 50 MCG/ACT nasal spray Place 1 spray into both nostrils daily as needed for allergies or rhinitis.   Marland Kitchen lactose free nutrition (BOOST) LIQD Take 237 mLs by mouth daily as needed (nutrional supplement).   . losartan (COZAAR) 25 MG tablet TAKE 1 TABLET BY MOUTH EVERY DAY  . metoprolol succinate (TOPROL-XL) 25 MG 24 hr tablet TAKE 1 TABLET BY MOUTH EVERYDAY AT BEDTIME  . omeprazole (PRILOSEC) 40 MG capsule Take 40 mg by mouth daily.       Allergies:   Dobutamine, Epinephrine, Darvocet [propoxyphene n-acetaminophen], Excedrin extra strength [aspirin-acetaminophen-caffeine], Aspirin, Clarithromycin, Doxycycline, Penicillins, Prednisone, and Propoxyphene   Social History   Tobacco Use  . Smoking status: Current Every Day Smoker    Packs/day: 1.00    Years: 50.00    Pack years: 50.00    Types: Cigarettes  .  Smokeless tobacco: Current User  Substance Use Topics  . Alcohol use: No  . Drug use: No     Family Hx: The patient's family history includes Asthma in an other family member; CAD in an other family member; Heart Problems in her brother; Heart attack in her mother and another family member; Heart attack (age of onset: 36) in her father; Heart failure in an other family member; Osteoporosis in an other family member; Transient ischemic attack in her mother.  ROS:   Please see the history of present illness.     All other systems reviewed and are negative.   Prior CV studies:   The following studies were reviewed today:  Echo: 03/2016   - Left ventricle: The cavity size was normal.  Wall thickness was  increased in a pattern of mild LVH. Systolic function was normal.  The estimated ejection fraction was in the range of 60% to 65%.  Wall motion was normal; there were no regional wall motion  abnormalities. Features are consistent with a pseudonormal left  ventricular filling pattern, with concomitant abnormal relaxation  and increased filling pressure (grade 2 diastolic dysfunction).  - Aortic valve: There was mild regurgitation.  - Mitral valve: There was mild regurgitation.  - Left atrium: The atrium was mildly dilated.  - Right atrium: Central venous pressure (est): 3 mm Hg.  - Tricuspid valve: There was trivial regurgitation.  - Pulmonary arteries: Systolic pressure could not be accurately  estimated.  - Pericardium, extracardiac: There was no pericardial effusion  Labs/Other Tests and Data Reviewed:    EKG:  No ECG reviewed.  Recent Labs: 04/10/2019: BUN 19; Creatinine, Ser 1.29; Hemoglobin 10.6; Platelets 308; Potassium 4.7; Sodium 139   Recent Lipid Panel Lab Results  Component Value Date/Time   CHOL 148 04/10/2019 10:47 AM   TRIG 71 04/10/2019 10:47 AM   HDL 68 04/10/2019 10:47 AM   CHOLHDL 2.2 04/10/2019 10:47 AM   CHOLHDL 2.9 07/24/2016 11:25 AM    LDLCALC 66 04/10/2019 10:47 AM    Wt Readings from Last 3 Encounters:  04/10/19 105 lb 1.9 oz (47.7 kg)  06/12/18 108 lb (49 kg)  05/17/18 109 lb 12.8 oz (49.8 kg)     Objective:    Vital Signs:  There were no vitals taken for this visit.   No vital signs to review  ASSESSMENT & PLAN:    1. "Spell"   Concerning.   She has had no recurrence.SHe has a hx of SVT and is on flecanide and metoprolol   Came off flecanide in the past and felt more palpitaitons      I would recomm cutting back on flecanide to 50 bid   Continue metoprolol    Stop pseudophed. Has not had an EKG Recently but will  set up for Zio patch  Arm pain ? If due to palpitations   Follow   If recurs consider further eval  2  Hx of SVT   The pt was followed by Marella Chimes in past   Placed on Flecanide.  She has stopped in past but felt more   Resumed   I have not seen arrhythmias on EKG   I would recomm cutting back on dose to 50 bid     3  Hx orthostatic hypotension   Pt doing much better than she was in past   Need to confirm if she is on Mestinon still  4  Hx HTN   Keep on current regimen for now    5.  COVID-19 Education: The signs and symptoms of COVID-19 were discussed with the patient and how to seek care for testing (follow up with PCP or arrange E-visit). The importance of social distancing was discussed today.  Time:   Today, I have spent 20 minutes with the patient with telehealth technology discussing the above problems.     Medication Adjustments/Labs and Tests Ordered: Current medicines are reviewed at length with the patient today.  Concerns regarding medicines are outlined above.   Tests Ordered: No orders of the defined types were placed in this encounter.   Medication Changes: No orders of the defined types were placed in this encounter.   Follow Up:  Will be based on test results   Signed, Nevin Bloodgood  Tenny Craw, MD  10/12/2019 9:49 AM    [facility]

## 2019-10-12 NOTE — Telephone Encounter (Signed)
Patient enrolled for [employer] to mail a 14 day ZIO XT long term holter monitor to her home.  Instructions reviewed briefly as they are included in the monitor kit. 

## 2019-10-12 NOTE — Telephone Encounter (Signed)
Left message for patient to call back. Pt had virtual visit with Dr. Tenny Craw today. She should schedule a nurse visit for an EKG to be done on a day Dr. Tenny Craw is in clinic. Her daughter will need to arrange to bring her.

## 2019-10-12 NOTE — Patient Instructions (Signed)
Medication Instructions:  1.) Decrease flecainide to 50 mg twice a day  *If you need a refill on your cardiac medications before your next appointment, please call your pharmacy*  Lab Work: None  Testing/Procedures: Your physician has recommended that you wear a ZIO heart monitor. Heart monitors are medical devices that record the heart's electrical activity. Doctors most often use these monitors to diagnose arrhythmias. Arrhythmias are problems with the speed or rhythm of the heartbeat. The monitor is a small, portable device. You can wear one while you do your normal daily activities. This is usually used to diagnose what is causing palpitations/syncope (passing out).  Follow-Up: Please call and schedule a NURSE VISIT FOR AN EKG ON A DAY DR. Tenny Craw IS IN THE OFFICE.

## 2019-10-27 ENCOUNTER — Other Ambulatory Visit (INDEPENDENT_AMBULATORY_CARE_PROVIDER_SITE_OTHER): Payer: Medicare Other

## 2019-10-27 DIAGNOSIS — R002 Palpitations: Secondary | ICD-10-CM | POA: Diagnosis not present

## 2019-11-23 NOTE — Telephone Encounter (Signed)
Contacted patient again re: needing EKG. She said she just has not had the chance. She doesn't drive anymore and things have been hectic. She will see when her daughter is off again and will call and make appointment for EKG.  She put monitor back in mail 2 days ago. Aware we will call her w results.

## 2020-01-02 NOTE — Progress Notes (Signed)
Cardiology Office Note:    Date:  01/03/2020   ID:  Duffy Bruce, DOB [birth year], MRN [id number]  PCP:  Aletha Halim., PA-C  Cardiologist:  Dorris Carnes, MD  Electrophysiologist:  None   Referring MD: Aletha Halim., PA-C   Chief Complaint:  Follow-up (SVT)    Patient Profile:    BRYTTANY TORTORELLI is a 79 y.o. 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  LHC 10/02 Normal coronary arteries  Echo 11/01 EF 60%, mild anterior MVP with trivial MR, PASP 35-40 mmHg, LAE  History of Present Illness:    Ms. Heckel was last seen by Dr. Harrington Challenger via telemedicine in February 2021.  Her flecainide dose was reduced to 50 mg twice daily.  The patient had symptoms of palpitations and a cardiac monitor was arranged.  This did not demonstrate any significant arrhythmias.   She is here today with her daughter.  She continues to have palpitations.  She describes a skipping sensation.  Her husband is very sick and she often gets upset.  She does take Xanax with relief.  She has not had any rapid palpitations.  She has not had syncope, chest pain or shortness of breath.  She has not had orthopnea or lower extremity swelling.  She does note poor balance as well as dizziness when she stands.  Past Medical History:  Diagnosis Date  . Anxiety   . Brain aneurysm    pipeline stent 2017   . COPD (chronic obstructive pulmonary disease) ([place])   . Fatigue   . H/O: hysterectomy   . Headache(784.0)   . History of cardiac  catheterization    LHC 10/02: Normal coronary arteries  . History of nuclear stress test    Myoview 10/12: Normal stress nuclear study.  . Hypertension   . Mitral valve prolapse    Echo 8/17:Mild LVH, EF 60-65, normal wall motion, grade 2 diastolic dysfunction, mild AI, mild MR, mild LAE // echo 11/01: EF 60%, mild anterior MVP with trivial MR, PASP 35-40 mmHg, LAE  . Orthostasis   . PSVT (paroxysmal supraventricular tachycardia) (HCC)    palps  . Radiculopathy     Current Medications: Current Meds  Medication Sig  . acetaminophen (TYLENOL) 650 MG CR tablet Take 1,300 mg by mouth every 8 (eight) hours as needed for pain.  Marland Kitchen ALPRAZolam (XANAX) 0.5 MG tablet Take 0.5 mg by mouth 3 (three) times daily.   . butalbital-acetaminophen-caffeine (FIORICET, ESGIC) 50-325-40 MG per tablet Take 1-2 tablets by mouth every 4 (four) hours as needed for headache or migraine.   . cholecalciferol (VITAMIN D) 1000 units tablet Take 1,000 Units by mouth daily.  . clopidogrel (PLAVIX) 75 MG tablet Take 75 mg by mouth daily.  . flecainide (TAMBOCOR) 50 MG tablet Take 1 tablet (50 mg total) by mouth 2 (two) times daily.  Marland Kitchen FLUoxetine (PROZAC) 20 MG capsule Take 20 mg by mouth 2 (two) times daily.  . fluticasone (FLONASE) 50 MCG/ACT nasal spray Place 1  spray into both nostrils daily as needed for allergies or rhinitis.   Marland Kitchen lactose free nutrition (BOOST) LIQD Take 237 mLs by mouth daily as needed (nutrional supplement).   . losartan (COZAAR) 25 MG tablet TAKE 1 TABLET BY MOUTH EVERY DAY  . metoprolol succinate (TOPROL-XL) 25 MG 24 hr tablet TAKE 1 TABLET BY MOUTH EVERYDAY AT BEDTIME  . omeprazole (PRILOSEC) 40 MG capsule Take 40 mg by mouth daily.    . Probiotic Product (PROBIOTIC PO) Take liquid probiotic by mouth twice daily (Activia)      Allergies:   Dobutamine, Epinephrine, Darvocet [propoxyphene n-acetaminophen], Excedrin extra strength [aspirin-acetaminophen-caffeine], Aspirin, Clarithromycin,  Doxycycline, Penicillins, Prednisone, and Propoxyphene   Social History   Tobacco Use  . Smoking status: Current Every Day Smoker    Packs/day: 1.00    Years: 50.00    Pack years: 50.00    Types: Cigarettes  . Smokeless tobacco: Current User  Substance Use Topics  . Alcohol use: No  . Drug use: No     Family Hx: The patient's family history includes Asthma in an other family member; CAD in an other family member; Heart Problems in her brother; Heart attack in her mother and another family member; Heart attack (age of onset: 31) in her father; Heart failure in an other family member; Osteoporosis in an other family member; Transient ischemic attack in her mother.  Review of Systems  Neurological: Positive for loss of balance and tremors.     EKGs/Labs/Other Test Reviewed:    EKG:  EKG is  ordered today.  The ekg ordered today demonstrates sinus bradycardia, HR 53, left axis deviation, septal Q waves, QTC 480, no change from prior tracing  Recent Labs: 04/10/2019: BUN 19; Creatinine, Ser 1.29; Hemoglobin 10.6; Platelets 308; Potassium 4.7; Sodium 139   Recent Lipid Panel Lab Results  Component Value Date/Time   CHOL 148 04/10/2019 10:47 AM   TRIG 71 04/10/2019 10:47 AM   HDL 68 04/10/2019 10:47 AM   CHOLHDL 2.2 04/10/2019 10:47 AM   CHOLHDL 2.9 07/24/2016 11:25 AM   LDLCALC 66 04/10/2019 10:47 AM    Physical Exam:    VS:  BP 124/70   Pulse (!) 53   Ht 5\' 3"  (1.6 m)   Wt 107 lb 6.4 oz (48.7 kg)   SpO2 96%   BMI 19.03 kg/m     Wt Readings from Last 3 Encounters:  01/03/20 107 lb 6.4 oz (48.7 kg)  04/10/19 105 lb 1.9 oz (47.7 kg)  06/12/18 108 lb (49 kg)     Constitutional:      Appearance: Healthy appearance. Not in distress.  Neck:     Thyroid: No thyromegaly.     Vascular: JVD normal.  Pulmonary:     Effort: Pulmonary effort is normal.     Breath sounds: No wheezing. No rales.  Cardiovascular:     Normal rate. Regular rhythm. Normal S1. Normal S2.      Murmurs: There is no murmur.  Edema:    Peripheral edema absent.  Abdominal:     Palpations: Abdomen is soft. There is no hepatomegaly.  Skin:    General: Skin is warm and dry.  Neurological:     Mental Status: Alert and oriented to person, place and time.     Cranial Nerves: Cranial nerves are intact.     Motor: Tremor (R hand, R foot (intermittent)) present.      ASSESSMENT & PLAN:    1. Paroxysmal SVT (supraventricular tachycardia) (HCC)  No significant arrhythmia noted on recent event monitor.  Overall controlled on flecainide, metoprolol succinate.  2. Palpitations She continues to have palpitations.  She describes symptoms that sound consistent with PACs versus PVCs.  We discussed the importance of limiting caffeine as well as stimulants.  No further evaluation is warranted at this time.  3. Orthostatic hypotension She is having symptoms of imbalance as well as dizziness with standing.  She is no longer wearing compression stockings.  I have asked her to go ahead and start wearing compression stockings again.  If her symptoms do not improve with this, we could consider pyridostigmine again.  4. Tremor She has a tremor in her right hand and right foot from time to time.  I have asked her to follow-up with primary care.  She may need referral to neurology.     Dispo:  Return in about 4 months (around 05/05/2020) for Routine Follow Up, w/ Dr. Tenny Craw, in person.   Medication Adjustments/Labs and Tests Ordered: Current medicines are reviewed at length with the patient today.  Concerns regarding medicines are outlined above.  Tests Ordered: Orders Placed This Encounter  Procedures  . EKG 12-Lead   Medication Changes: No orders of the defined types were placed in this encounter.   Signed, Tereso Newcomer, PA-C  01/03/2020 5:04 PM    [facility] [address] [place], [place], [place]  [address] Phone: [phone]; Fax: [phone]

## 2020-01-03 ENCOUNTER — Ambulatory Visit: Payer: Medicare Other | Admitting: Physician Assistant

## 2020-01-03 ENCOUNTER — Other Ambulatory Visit: Payer: Self-pay

## 2020-01-03 ENCOUNTER — Encounter: Payer: Self-pay | Admitting: Physician Assistant

## 2020-01-03 VITALS — BP 124/70 | HR 53 | Ht 63.0 in | Wt 107.4 lb

## 2020-01-03 DIAGNOSIS — I471 Supraventricular tachycardia: Secondary | ICD-10-CM | POA: Diagnosis not present

## 2020-01-03 DIAGNOSIS — R002 Palpitations: Secondary | ICD-10-CM | POA: Diagnosis not present

## 2020-01-03 DIAGNOSIS — R251 Tremor, unspecified: Secondary | ICD-10-CM

## 2020-01-03 DIAGNOSIS — I951 Orthostatic hypotension: Secondary | ICD-10-CM

## 2020-01-03 NOTE — Patient Instructions (Addendum)
Medication Instructions:   Your physician recommends that you continue on your current medications as directed. Please refer to the Current Medication list given to you today.  *If you need a refill on your cardiac medications before your next appointment, please call your pharmacy*  Lab Work:  None ordered today  Testing/Procedures:  None ordered today  Follow-Up: At [facility], you and your health needs are our priority.  As part of our continuing mission to provide you with exceptional heart care, we have created designated Provider Care Teams.  These Care Teams include your primary Cardiologist (physician) and Advanced Practice Providers (APPs -  Physician Assistants and Nurse Practitioners) who all work together to provide you with the care you need, when you need it.  We recommend signing up for the patient portal called "MyChart".  Sign up information is provided on this After Visit Summary.  MyChart is used to connect with patients for Virtual Visits (Telemedicine).  Patients are able to view lab/test results, encounter notes, upcoming appointments, etc.  Non-urgent messages can be sent to your provider as well.   To learn more about what you can do with MyChart, go to [email or web address].    Your next appointment:    On 05/06/20 at 11:20AM with Dietrich Pates, MD

## 2020-01-24 ENCOUNTER — Inpatient Hospital Stay (HOSPITAL_COMMUNITY)
Admission: EM | Admit: 2020-01-24 | Discharge: 2020-02-10 | DRG: 252 | Disposition: A | Discharge status: Skilled Nursing Facility | Payer: Medicare Other | Attending: Student | Admitting: Student

## 2020-01-24 ENCOUNTER — Other Ambulatory Visit: Payer: Self-pay

## 2020-01-24 DIAGNOSIS — G92 Toxic encephalopathy: Secondary | ICD-10-CM | POA: Diagnosis not present

## 2020-01-24 DIAGNOSIS — I82433 Acute embolism and thrombosis of popliteal vein, bilateral: Secondary | ICD-10-CM | POA: Diagnosis not present

## 2020-01-24 DIAGNOSIS — K219 Gastro-esophageal reflux disease without esophagitis: Secondary | ICD-10-CM | POA: Diagnosis present

## 2020-01-24 DIAGNOSIS — F419 Anxiety disorder, unspecified: Secondary | ICD-10-CM | POA: Diagnosis present

## 2020-01-24 DIAGNOSIS — I70223 Atherosclerosis of native arteries of extremities with rest pain, bilateral legs: Secondary | ICD-10-CM | POA: Diagnosis present

## 2020-01-24 DIAGNOSIS — M79A21 Nontraumatic compartment syndrome of right lower extremity: Secondary | ICD-10-CM | POA: Diagnosis present

## 2020-01-24 DIAGNOSIS — B952 Enterococcus as the cause of diseases classified elsewhere: Secondary | ICD-10-CM | POA: Diagnosis present

## 2020-01-24 DIAGNOSIS — M79A22 Nontraumatic compartment syndrome of left lower extremity: Secondary | ICD-10-CM | POA: Diagnosis present

## 2020-01-24 DIAGNOSIS — I951 Orthostatic hypotension: Secondary | ICD-10-CM | POA: Diagnosis present

## 2020-01-24 DIAGNOSIS — Z881 Allergy status to other antibiotic agents status: Secondary | ICD-10-CM

## 2020-01-24 DIAGNOSIS — Z8262 Family history of osteoporosis: Secondary | ICD-10-CM

## 2020-01-24 DIAGNOSIS — I129 Hypertensive chronic kidney disease with stage 1 through stage 4 chronic kidney disease, or unspecified chronic kidney disease: Secondary | ICD-10-CM | POA: Diagnosis present

## 2020-01-24 DIAGNOSIS — M79604 Pain in right leg: Secondary | ICD-10-CM

## 2020-01-24 DIAGNOSIS — F17200 Nicotine dependence, unspecified, uncomplicated: Secondary | ICD-10-CM | POA: Diagnosis present

## 2020-01-24 DIAGNOSIS — Z8673 Personal history of transient ischemic attack (TIA), and cerebral infarction without residual deficits: Secondary | ICD-10-CM

## 2020-01-24 DIAGNOSIS — Z681 Body mass index (BMI) 19 or less, adult: Secondary | ICD-10-CM

## 2020-01-24 DIAGNOSIS — Z888 Allergy status to other drugs, medicaments and biological substances status: Secondary | ICD-10-CM

## 2020-01-24 DIAGNOSIS — E43 Unspecified severe protein-calorie malnutrition: Secondary | ICD-10-CM | POA: Diagnosis present

## 2020-01-24 DIAGNOSIS — Z90710 Acquired absence of both cervix and uterus: Secondary | ICD-10-CM

## 2020-01-24 DIAGNOSIS — N1832 Chronic kidney disease, stage 3b: Secondary | ICD-10-CM | POA: Diagnosis present

## 2020-01-24 DIAGNOSIS — G9340 Encephalopathy, unspecified: Secondary | ICD-10-CM

## 2020-01-24 DIAGNOSIS — Z9049 Acquired absence of other specified parts of digestive tract: Secondary | ICD-10-CM

## 2020-01-24 DIAGNOSIS — Z20822 Contact with and (suspected) exposure to covid-19: Secondary | ICD-10-CM | POA: Diagnosis present

## 2020-01-24 DIAGNOSIS — I671 Cerebral aneurysm, nonruptured: Secondary | ICD-10-CM | POA: Diagnosis present

## 2020-01-24 DIAGNOSIS — J449 Chronic obstructive pulmonary disease, unspecified: Secondary | ICD-10-CM | POA: Diagnosis present

## 2020-01-24 DIAGNOSIS — I471 Supraventricular tachycardia: Secondary | ICD-10-CM | POA: Diagnosis present

## 2020-01-24 DIAGNOSIS — D649 Anemia, unspecified: Secondary | ICD-10-CM | POA: Diagnosis present

## 2020-01-24 DIAGNOSIS — Z825 Family history of asthma and other chronic lower respiratory diseases: Secondary | ICD-10-CM

## 2020-01-24 DIAGNOSIS — N390 Urinary tract infection, site not specified: Secondary | ICD-10-CM | POA: Diagnosis present

## 2020-01-24 DIAGNOSIS — Z7902 Long term (current) use of antithrombotics/antiplatelets: Secondary | ICD-10-CM

## 2020-01-24 DIAGNOSIS — Z8249 Family history of ischemic heart disease and other diseases of the circulatory system: Secondary | ICD-10-CM

## 2020-01-24 DIAGNOSIS — I998 Other disorder of circulatory system: Secondary | ICD-10-CM

## 2020-01-24 DIAGNOSIS — F17210 Nicotine dependence, cigarettes, uncomplicated: Secondary | ICD-10-CM | POA: Diagnosis present

## 2020-01-24 DIAGNOSIS — D62 Acute posthemorrhagic anemia: Secondary | ICD-10-CM | POA: Diagnosis not present

## 2020-01-24 DIAGNOSIS — R319 Hematuria, unspecified: Secondary | ICD-10-CM | POA: Diagnosis present

## 2020-01-24 DIAGNOSIS — Z79899 Other long term (current) drug therapy: Secondary | ICD-10-CM

## 2020-01-24 DIAGNOSIS — Z886 Allergy status to analgesic agent status: Secondary | ICD-10-CM

## 2020-01-24 DIAGNOSIS — N183 Chronic kidney disease, stage 3 unspecified: Secondary | ICD-10-CM | POA: Diagnosis present

## 2020-01-24 DIAGNOSIS — E876 Hypokalemia: Secondary | ICD-10-CM | POA: Diagnosis not present

## 2020-01-24 DIAGNOSIS — M21372 Foot drop, left foot: Secondary | ICD-10-CM | POA: Diagnosis not present

## 2020-01-24 DIAGNOSIS — M96841 Postprocedural hematoma of a musculoskeletal structure following other procedure: Secondary | ICD-10-CM | POA: Diagnosis not present

## 2020-01-24 LAB — CBC WITH DIFFERENTIAL/PLATELET
Abs Immature Granulocytes: 0.01 10*3/uL (ref 0.00–0.07)
Basophils Absolute: 0 10*3/uL (ref 0.0–0.1)
Basophils Relative: 1 %
Eosinophils Absolute: 0.3 10*3/uL (ref 0.0–0.5)
Eosinophils Relative: 5 %
HCT: 31.5 % — ABNORMAL LOW (ref 36.0–46.0)
Hemoglobin: 10 g/dL — ABNORMAL LOW (ref 12.0–15.0)
Immature Granulocytes: 0 %
Lymphocytes Relative: 42 %
Lymphs Abs: 2.6 10*3/uL (ref 0.7–4.0)
MCH: 28.1 pg (ref 26.0–34.0)
MCHC: 31.7 g/dL (ref 30.0–36.0)
MCV: 88.5 fL (ref 80.0–100.0)
Monocytes Absolute: 0.7 10*3/uL (ref 0.1–1.0)
Monocytes Relative: 11 %
Neutro Abs: 2.5 10*3/uL (ref 1.7–7.7)
Neutrophils Relative %: 41 %
Platelets: 243 10*3/uL (ref 150–400)
RBC: 3.56 MIL/uL — ABNORMAL LOW (ref 3.87–5.11)
RDW: 12.9 % (ref 11.5–15.5)
WBC: 6.1 10*3/uL (ref 4.0–10.5)
nRBC: 0 % (ref 0.0–0.2)

## 2020-01-24 LAB — BASIC METABOLIC PANEL
Anion gap: 8 (ref 5–15)
BUN: 18 mg/dL (ref 8–23)
CO2: 28 mmol/L (ref 22–32)
Calcium: 8.8 mg/dL — ABNORMAL LOW (ref 8.9–10.3)
Chloride: 104 mmol/L (ref 98–111)
Creatinine, Ser: 1.21 mg/dL — ABNORMAL HIGH (ref 0.44–1.00)
GFR calc Af Amer: 50 mL/min — ABNORMAL LOW (ref >60)
GFR calc non Af Amer: 43 mL/min — ABNORMAL LOW (ref >60)
Glucose, Bld: 102 mg/dL — ABNORMAL HIGH (ref 70–99)
Potassium: 3.9 mmol/L (ref 3.5–5.1)
Sodium: 140 mmol/L (ref 135–145)

## 2020-01-24 NOTE — Discharge Instructions (Addendum)
[facility]  Discharge instructions  Lower Extremity Bypass Surgery  Please refer to the following instruction for your post-procedure care. Your surgeon or physician assistant will discuss any changes with you.  Activity  You are encouraged to walk as much as you can. You can slowly return to normal activities during the month after your surgery. Avoid strenuous activity and heavy lifting until your doctor tells you it's OK. Avoid activities such as vacuuming or swinging a golf club. Do not drive until your doctor give the OK and you are no longer taking prescription pain medications. It is also normal to have difficulty with sleep habits, eating and bowel movement after surgery. These will go away with time.  Bathing/Showering  Shower daily after you go home. Do not soak in a bathtub, hot tub, or swim until the incision heals completely.  Incision Care  Clean your incision with mild soap and water. Shower every day. Pat the area dry with a clean towel. You do not need a bandage unless otherwise instructed. Do not apply any ointments or creams to your incision. If you have open wounds you will be instructed how to care for them or a visiting nurse may be arranged for you. If you have staples or sutures along your incision they will be removed at your post-op appointment. You may have skin glue on your incision. Do not peel it off. It will come off on its own in about one week.  Diet  Resume your normal diet. There are no special food restrictions following this procedure. A low fat/ low cholesterol diet is recommended for all patients with vascular disease. In order to heal from your surgery, it is CRITICAL to get adequate nutrition. Your body requires vitamins, minerals, and protein. Vegetables are the best source of vitamins and minerals. Vegetables also provide the perfect balance of protein. Processed food has little nutritional value, so try to avoid  this.  Medications  Resume taking all your medications unless your doctor or physician assistant tells you not to. If your incision is causing pain, you may take over-the-counter pain relievers such as acetaminophen (Tylenol). If you were prescribed a stronger pain medication, please aware these medication can cause nausea and constipation. Prevent nausea by taking the medication with a snack or meal. Avoid constipation by drinking plenty of fluids and eating foods with high amount of fiber, such as fruits, vegetables, and grains. Take Colace 100 mg (an over-the-counter stool softener) twice a day as needed for constipation.  Do not take Tylenol if you are taking prescription pain medications.  Follow Up  Our office will schedule a follow up appointment 2-3 weeks following discharge.  Please call us immediately for any of the following conditions  .Severe or worsening pain in your legs or feet while at rest or while walking .Increase pain, redness, warmth, or drainage (pus) from your incision site(s) . Fever of 101 degree or higher . The swelling in your leg with the bypass suddenly worsens and becomes more painful than when you were in the hospital . If you have been instructed to feel your graft pulse then you should do so every day. If you can no longer feel this pulse, call the office immediately. Not all patients are given this instruction. .  Leg swelling is common after leg bypass surgery.  The swelling should improve over a few months following surgery. To improve the swelling, you may elevate your legs above the level of  your heart while you are sitting or resting. Your surgeon or physician assistant may ask you to apply an ACE wrap or wear compression (TED) stockings to help to reduce swelling.  Reduce your risk of vascular disease  Stop smoking. If you would like help call [facility] at 1-800-QUIT-NOW 207-698-8622) or [place] at [phone].  . Manage your  cholesterol . Maintain a desired weight . Control your diabetes weight . Control your diabetes . Keep your blood pressure down .  If you have any questions, please call the office at [phone]   Information on my medicine - ELIQUIS (apixaban)  This medication education was reviewed with me or my healthcare representative as part of my discharge preparation.  The pharmacist that spoke with me during my hospital stay was:  Einar Grad, [facility]  Why was Eliquis prescribed for you? Eliquis was prescribed to treat blood clots that may have been found in the veins of your legs (deep vein thrombosis) or in your lungs (pulmonary embolism) and to reduce the risk of them occurring again.  What do You need to know about Eliquis ? The starting dose is ONE 5 mg tablet taken TWICE daily.  Eliquis may be taken with or without food.   Try to take the dose about the same time in the morning and in the evening. If you have difficulty swallowing the tablet whole please discuss with your pharmacist how to take the medication safely.  Take Eliquis exactly as prescribed and DO NOT stop taking Eliquis without talking to the doctor who prescribed the medication.  Stopping may increase your risk of developing a new blood clot.  Refill your prescription before you run out.  After discharge, you should have regular check-up appointments with your healthcare provider that is prescribing your Eliquis.    What do you do if you miss a dose? If a dose of ELIQUIS is not taken at the scheduled time, take it as soon as possible on the same day and twice-daily administration should be resumed. The dose should not be doubled to make up for a missed dose.  Important Safety Information A possible side effect of Eliquis is bleeding. You should call your healthcare provider right away if you experience any of the following: ? Bleeding from an injury or your nose that does not stop. ? Unusual colored urine (red  or dark brown) or unusual colored stools (red or black). ? Unusual bruising for unknown reasons. ? A serious fall or if you hit your head (even if there is no bleeding).  Some medicines may interact with Eliquis and might increase your risk of bleeding or clotting while on Eliquis. To help avoid this, consult your healthcare provider or pharmacist prior to using any new prescription or non-prescription medications, including herbals, vitamins, non-steroidal anti-inflammatory drugs (NSAIDs) and supplements.  This website has more information on Eliquis (apixaban): http://www.eliquis.com/eliquis/home

## 2020-01-24 NOTE — ED Provider Notes (Signed)
[facility] EMERGENCY DEPARTMENT Provider Note   CSN: [id number] Arrival date & time: 01/24/20  2103     History Chief Complaint  Patient presents with  . Leg Pain    Barbara Hill is a 79 y.o. female.  Patient is a 79 year old female with a history of hypertension, paroxysmal SVT, COPD, tobacco abuse but quit smoking 2 years ago and brain aneurysm who is presenting today with sudden onset of right foot and lower leg pain.  Patient states she was sitting on a chair with her legs crossed when she tried to get up to do something and developed a severe 9 out of 10 pain in her right foot to her mid tib-fib.  She states her foot felt tingly and had severe pain.  She felt also like her foot was a little paler than the other side.  She has never had any pain like that before.  It started about 530 and she reports as they were pulling up to the emergency room the pain suddenly resolved.  She currently states there is no pain in her leg and no tingling or numbness.  She feels back to normal.  Patient reports that she had felt her normal self today.  She was doing her normal activities and had no difficulty.  She denies any recent medication changes.  She denies any recent illness.  She is currently taking Plavix.  The history is provided by the patient.  Leg Pain Location:  Foot and leg Time since incident:  4 hours Injury: no   Leg location:  R lower leg Foot location:  R foot Pain details:    Quality:  Shooting, sharp and tingling   Radiates to:  Does not radiate   Severity:  Severe   Onset quality:  Sudden   Duration:  4 hours   Timing:  Constant   Progression:  Resolved Chronicity:  New Prior injury to area:  No Relieved by:  Nothing Worsened by:  Bearing weight, extension and activity Ineffective treatments:  Rest and elevation Associated symptoms: tingling   Associated symptoms: no swelling   Associated symptoms comment:  Thought maybe her foot on the right was  whiter      Past Medical History:  Diagnosis Date  . Anxiety   . Brain aneurysm    pipeline stent 2017   . COPD (chronic obstructive pulmonary disease) ([place])   . Fatigue   . H/O: hysterectomy   . Headache(784.0)   . History of cardiac catheterization    LHC 10/02: Normal coronary arteries  . History of nuclear stress test    Myoview 10/12: Normal stress nuclear study.  . Hypertension   . Mitral valve prolapse    Echo 8/17:Mild LVH, EF 60-65, normal wall motion, grade 2 diastolic dysfunction, mild AI, mild MR, mild LAE // echo 11/01: EF 60%, mild anterior MVP with trivial MR, PASP 35-40 mmHg, LAE  . Orthostasis   . PSVT (paroxysmal supraventricular tachycardia) ([facility])    palps  . Radiculopathy     Patient Active Problem List   Diagnosis Date Noted  . 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 Brain aneurysm 06/29/2016  . Lumbar contusion   . Orthostatic hypotension   . First degree AV block   . Hypokalemia   . Syncope 04/10/2016  . Paroxysmal SVT (supraventricular tachycardia) ([place]) 04/21/2011  . 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 Tobacco use disorder 04/21/2011    Past Surgical History:  Procedure Laterality Date  .  ABDOMINAL HYSTERECTOMY    . APPENDECTOMY    . CARDIAC CATHETERIZATION  12/95  . CATARACT EXTRACTION    . CHOLECYSTECTOMY    . IR GENERIC HISTORICAL  06/02/2016   IR RADIOLOGIST EVAL & MGMT 06/02/2016 [facility]  . IR GENERIC HISTORICAL  06/29/2016   IR ANGIO INTRA EXTRACRAN SEL INTERNAL CAROTID UNI R MOD SED 06/29/2016 Julieanne CottonSanjeev Deveshwar, MD [facility]  . IR GENERIC HISTORICAL  06/29/2016   IR ANGIOGRAM FOLLOW UP STUDY 06/29/2016 Julieanne CottonSanjeev Deveshwar, MD [facility]  . IR GENERIC HISTORICAL  06/29/2016   IR NEURO EACH ADD'L AFTER BASIC UNI RIGHT (MS) 06/29/2016 Julieanne CottonSanjeev Deveshwar, MD [facility]  . IR GENERIC HISTORICAL  06/29/2016   IR 3D INDEPENDENT WKST 06/29/2016 Julieanne CottonSanjeev Deveshwar, MD [facility]  . IR GENERIC HISTORICAL   06/29/2016   IR TRANSCATH/EMBOLIZ 06/29/2016 Julieanne CottonSanjeev Deveshwar, MD [facility]  . IR GENERIC HISTORICAL  06/29/2016   IR ANGIO VERTEBRAL SEL SUBCLAVIAN INNOMINATE UNI R MOD SED 06/29/2016 Julieanne CottonSanjeev Deveshwar, MD [facility]  . IR GENERIC HISTORICAL  07/28/2016   IR RADIOLOGIST EVAL & MGMT 07/28/2016 [facility]  . IR GENERIC HISTORICAL  11/12/2016   IR ANGIO INTRA EXTRACRAN SEL COM CAROTID INNOMINATE BILAT MOD SED 11/12/2016 Julieanne CottonSanjeev Deveshwar, MD [facility]  . IR GENERIC HISTORICAL  11/12/2016   IR ANGIO VERTEBRAL SEL VERTEBRAL UNI L MOD SED 11/12/2016 Julieanne CottonSanjeev Deveshwar, MD [facility]  . IR GENERIC HISTORICAL  11/12/2016   IR ANGIO VERTEBRAL SEL SUBCLAVIAN INNOMINATE UNI R MOD SED 11/12/2016 Julieanne CottonSanjeev Deveshwar, MD [facility]  . NASAL SEPTUM SURGERY    . RADIOLOGY WITH ANESTHESIA N/A 06/29/2016   Procedure: EMBOLIZATION;  Surgeon: Julieanne CottonSanjeev Deveshwar, MD;  Location: [facility] OR;  Service: Radiology;  Laterality: N/A;  . ROTATOR CUFF REPAIR       OB History   No obstetric history on file.     Family History  Problem Relation Age of Onset  . Heart attack Mother   . Transient ischemic attack Mother   . Heart attack Father 5180  . Heart attack Other   . Asthma Other   . Heart failure Other   . Osteoporosis Other   . Heart Problems Brother   . CAD Other     Social History   Tobacco Use  . Smoking status: Current Every Day Smoker    Packs/day: 1.00    Years: 50.00    Pack years: 50.00    Types: Cigarettes  . Smokeless tobacco: Current User  Substance Use Topics  . Alcohol use: No  . Drug use: No    Home Medications Prior to Admission medications   Medication Sig Start Date End Date Taking? Authorizing Provider  acetaminophen (TYLENOL) 650 MG CR tablet Take 1,300 mg by mouth every 8 (eight) hours as needed for pain.    [provider]  ALPRAZolam [name](XANAX) 0.5 MG tablet Take 0.5 mg by mouth 3 (three) times daily.     [provider]    butalbital-acetaminophen-caffeine (FIORICET, ESGIC) 50-325-40 MG per tablet Take 1-2 tablets by mouth every 4 (four) hours as needed for headache or migraine.  07/06/14   [provider]  cholecalciferol (VITAMIN D) 1000 units tablet Take 1,000 Units by mouth daily.    [provider]  clopidogrel (PLAVIX) 75 MG tablet Take 75 mg by mouth daily. 06/18/16   [provider]  flecainide (TAMBOCOR) 50 MG tablet Take 1 tablet (50 mg total) by mouth 2 (two) times daily. 10/12/19  Pricilla Riffle, MD  FLUoxetine (PROZAC) 20 MG capsule Take 20 mg by mouth 2 (two) times daily. 06/07/16   [provider]  fluticasone (FLONASE) 50 MCG/ACT nasal spray Place 1 spray into both nostrils daily as needed for allergies or rhinitis.  10/11/14   [provider]  lactose free nutrition (BOOST) LIQD Take 237 mLs by mouth daily as needed (nutrional supplement).     [provider]  losartan (COZAAR) 25 MG tablet TAKE 1 TABLET BY MOUTH EVERY DAY 11/28/18   Tereso Newcomer T, PA-C  metoprolol succinate (TOPROL-XL) 25 MG 24 hr tablet TAKE 1 TABLET BY MOUTH EVERYDAY AT BEDTIME 10/02/19   Pricilla Riffle, MD  omeprazole (PRILOSEC) 40 MG capsule Take 40 mg by mouth daily.      [provider]  Probiotic Product (PROBIOTIC PO) Take liquid probiotic by mouth twice daily (Activia)     [provider]    Allergies    Dobutamine, Epinephrine, Darvocet [propoxyphene n-acetaminophen], Excedrin extra strength [aspirin-acetaminophen-caffeine], Aspirin, Clarithromycin, Doxycycline, Penicillins, Prednisone, and Propoxyphene  Review of Systems   Review of Systems  All other systems reviewed and are negative.   Physical Exam Updated Vital Signs BP (!) 165/67   Pulse (!) 55   Temp 98.5 F (36.9 C) (Oral)   Resp 20   Ht 5\' 3"  (1.6 m)   Wt 48.5 kg   SpO2 100%   BMI 18.95 kg/m   Physical Exam Vitals and nursing note reviewed.  Constitutional:      General: She is  not in acute distress.    Appearance: Normal appearance. She is well-developed and normal weight.  HENT:     Head: Normocephalic and atraumatic.  Eyes:     Pupils: Pupils are equal, round, and reactive to light.  Cardiovascular:     Rate and Rhythm: Normal rate and regular rhythm.     Heart sounds: Normal heart sounds. No murmur. No friction rub.  Pulmonary:     Effort: Pulmonary effort is normal.     Breath sounds: Normal breath sounds. No wheezing or rales.  Abdominal:     General: Bowel sounds are normal. There is no distension.     Palpations: Abdomen is soft.     Tenderness: There is no abdominal tenderness. There is no guarding or rebound.  Musculoskeletal:        General: No tenderness. Normal range of motion.     Right lower leg: No edema.     Left lower leg: No edema.     Comments: No edema.  Bilateral feet are cool to the touch with less than 3-second capillary refill.  Faint DP pulses present in bilateral feet and foot color is the same bilaterally.  2+ femoral pulses bilaterally.  No pain with palpation of the foot at this time.  Skin:    General: Skin is warm and dry.     Findings: No rash.  Neurological:     Mental Status: She is alert and oriented to person, place, and time. Mental status is at baseline.     Cranial Nerves: No cranial nerve deficit.     Comments: Normal flexion and extension of bilateral feet.  Sensation intact bilaterally.  Psychiatric:        Mood and Affect: Mood normal.        Behavior: Behavior normal.        Thought Content: Thought content normal.     ED Results / Procedures / Treatments  Labs (all labs ordered are listed, but only abnormal results are displayed) Labs Reviewed  CBC WITH DIFFERENTIAL/PLATELET - Abnormal; Notable for the following components:      Result Value   RBC 3.56 (*)    Hemoglobin 10.0 (*)    HCT 31.5 (*)    All other components within normal limits  BASIC METABOLIC PANEL - Abnormal; Notable for the following  components:   Glucose, Bld 102 (*)    Creatinine, Ser 1.21 (*)    Calcium 8.8 (*)    GFR calc non Af Amer 43 (*)    GFR calc Af Amer 50 (*)    All other components within normal limits    EKG EKG Interpretation  Date/Time:  Wednesday January 24 2020 21:07:06 EDT Ventricular Rate:  53 PR Interval:    QRS Duration: 99 QT Interval:  522 QTC Calculation: 491 R Axis:   -76 Text Interpretation: Sinus rhythm Prolonged PR interval Left anterior fascicular block Anterior infarct, old No significant change since last tracing Confirmed by Gwyneth Sprout ([id number]) on 01/24/2020 9:26:10 PM   Radiology No results found.  Procedures Procedures (including critical care time)  Medications Ordered in ED Medications - No data to display  ED Course  I have reviewed the triage vital signs and the nursing notes.  Pertinent labs & imaging results that were available during my care of the patient were reviewed by me and considered in my medical decision making (see chart for details).    MDM Rules/Calculators/A&P                      Patient is a 79 year old female presenting today with sudden onset of foot and lower leg pain that started approximately 4 hours prior to arrival.  Patient reports her legs were crossed just before the pain started.  She did describe a tingling sensation as well like she could not feel if her foot was on the floor but the pain was the most prominent symptom.  She has never had anything like this before.  EMS when they arrived reported they were unable to palpate a pulse on the right but left DP pulse was faint.  Patient is currently pain-free at this time.  However concern for possible peripheral or artery disease given her history of long-term tobacco use and history.  Currently patient does not have evidence of ischemia.  However she has also had prior history of hypokalemia so we will check electrolytes to ensure that is not the cause of her symptoms today.  No signs  concerning for DVT at this time.  Will Doppler to confirm pulses present.  Labs are pending.  11:53 PM Pt's Hb is stable and Cr. Unchanged and normal K.  Spoke with Dr. Arbie Cookey and discussed patient's exam and story.  With Doppler patient's pulse on the right is much quieter and more monophasic.  However she is currently asymptomatic and is able to ambulate and is not having any recurrent pain.  Patient will be discharged home and vascular surgery will plan on following up with her on Friday.  Patient was given strict instructions to return if the pain returns.  MDM Number of Diagnoses or Management Options   Amount and/or Complexity of Data Reviewed Clinical lab tests: ordered and reviewed Decide to obtain previous medical records or to obtain history from someone other than the patient: yes Independent visualization of images, tracings, or specimens: yes  Risk of Complications, Morbidity, and/or Mortality Presenting problems:  moderate Diagnostic procedures: low Management options: low  Patient Progress Patient progress: improved   Final Clinical Impression(s) / ED Diagnoses Final diagnoses:  Right leg pain    Rx / DC Orders ED Discharge Orders    None       Gwyneth Sprout, MD 01/25/20 0000

## 2020-01-24 NOTE — ED Triage Notes (Signed)
Pt. BIB by [facility]. Pt complaining of lower leg pain in the right leg at about 1830. EMS arrived and couldn't find pedal pulse in R foot. L side pedal pulse is weak per EMS. No SOB. AaOx4. EMS VS: BP 181/78, 52 pulse, and 97% on RA. Arrives with 18g IV in RFA.

## 2020-01-25 ENCOUNTER — Encounter (HOSPITAL_COMMUNITY): Payer: Self-pay | Admitting: Radiology

## 2020-01-25 ENCOUNTER — Inpatient Hospital Stay (HOSPITAL_COMMUNITY): Payer: Medicare Other

## 2020-01-25 ENCOUNTER — Encounter (HOSPITAL_COMMUNITY)
Admission: EM | Disposition: A | Discharge status: Skilled Nursing Facility | Payer: Self-pay | Source: Home / Self Care | Attending: Internal Medicine

## 2020-01-25 ENCOUNTER — Emergency Department (HOSPITAL_COMMUNITY): Payer: Medicare Other | Admitting: Anesthesiology

## 2020-01-25 ENCOUNTER — Emergency Department (HOSPITAL_COMMUNITY): Payer: Medicare Other

## 2020-01-25 DIAGNOSIS — N1831 Chronic kidney disease, stage 3a: Secondary | ICD-10-CM | POA: Diagnosis not present

## 2020-01-25 DIAGNOSIS — J449 Chronic obstructive pulmonary disease, unspecified: Secondary | ICD-10-CM

## 2020-01-25 DIAGNOSIS — E876 Hypokalemia: Secondary | ICD-10-CM | POA: Diagnosis not present

## 2020-01-25 DIAGNOSIS — I743 Embolism and thrombosis of arteries of the lower extremities: Secondary | ICD-10-CM

## 2020-01-25 DIAGNOSIS — I70223 Atherosclerosis of native arteries of extremities with rest pain, bilateral legs: Secondary | ICD-10-CM | POA: Diagnosis present

## 2020-01-25 DIAGNOSIS — Z20822 Contact with and (suspected) exposure to covid-19: Secondary | ICD-10-CM | POA: Diagnosis present

## 2020-01-25 DIAGNOSIS — I998 Other disorder of circulatory system: Secondary | ICD-10-CM | POA: Diagnosis present

## 2020-01-25 DIAGNOSIS — Z681 Body mass index (BMI) 19 or less, adult: Secondary | ICD-10-CM | POA: Diagnosis not present

## 2020-01-25 DIAGNOSIS — D62 Acute posthemorrhagic anemia: Secondary | ICD-10-CM | POA: Diagnosis not present

## 2020-01-25 DIAGNOSIS — D649 Anemia, unspecified: Secondary | ICD-10-CM | POA: Diagnosis present

## 2020-01-25 DIAGNOSIS — M79A22 Nontraumatic compartment syndrome of left lower extremity: Secondary | ICD-10-CM | POA: Diagnosis present

## 2020-01-25 DIAGNOSIS — I340 Nonrheumatic mitral (valve) insufficiency: Secondary | ICD-10-CM | POA: Diagnosis not present

## 2020-01-25 DIAGNOSIS — I75023 Atheroembolism of bilateral lower extremities: Secondary | ICD-10-CM

## 2020-01-25 DIAGNOSIS — G92 Toxic encephalopathy: Secondary | ICD-10-CM | POA: Diagnosis not present

## 2020-01-25 DIAGNOSIS — E43 Unspecified severe protein-calorie malnutrition: Secondary | ICD-10-CM | POA: Diagnosis present

## 2020-01-25 DIAGNOSIS — Z8673 Personal history of transient ischemic attack (TIA), and cerebral infarction without residual deficits: Secondary | ICD-10-CM

## 2020-01-25 DIAGNOSIS — N1832 Chronic kidney disease, stage 3b: Secondary | ICD-10-CM | POA: Diagnosis present

## 2020-01-25 DIAGNOSIS — G9340 Encephalopathy, unspecified: Secondary | ICD-10-CM | POA: Diagnosis not present

## 2020-01-25 DIAGNOSIS — F419 Anxiety disorder, unspecified: Secondary | ICD-10-CM

## 2020-01-25 DIAGNOSIS — I471 Supraventricular tachycardia: Secondary | ICD-10-CM | POA: Diagnosis present

## 2020-01-25 DIAGNOSIS — F17200 Nicotine dependence, unspecified, uncomplicated: Secondary | ICD-10-CM

## 2020-01-25 DIAGNOSIS — F17210 Nicotine dependence, cigarettes, uncomplicated: Secondary | ICD-10-CM | POA: Diagnosis present

## 2020-01-25 DIAGNOSIS — M79A21 Nontraumatic compartment syndrome of right lower extremity: Secondary | ICD-10-CM | POA: Diagnosis present

## 2020-01-25 DIAGNOSIS — M96841 Postprocedural hematoma of a musculoskeletal structure following other procedure: Secondary | ICD-10-CM | POA: Diagnosis not present

## 2020-01-25 DIAGNOSIS — I129 Hypertensive chronic kidney disease with stage 1 through stage 4 chronic kidney disease, or unspecified chronic kidney disease: Secondary | ICD-10-CM | POA: Diagnosis present

## 2020-01-25 DIAGNOSIS — I951 Orthostatic hypotension: Secondary | ICD-10-CM | POA: Diagnosis present

## 2020-01-25 DIAGNOSIS — I351 Nonrheumatic aortic (valve) insufficiency: Secondary | ICD-10-CM | POA: Diagnosis not present

## 2020-01-25 DIAGNOSIS — I82433 Acute embolism and thrombosis of popliteal vein, bilateral: Secondary | ICD-10-CM | POA: Diagnosis present

## 2020-01-25 DIAGNOSIS — N390 Urinary tract infection, site not specified: Secondary | ICD-10-CM | POA: Diagnosis present

## 2020-01-25 DIAGNOSIS — R319 Hematuria, unspecified: Secondary | ICD-10-CM | POA: Diagnosis present

## 2020-01-25 DIAGNOSIS — K219 Gastro-esophageal reflux disease without esophagitis: Secondary | ICD-10-CM | POA: Diagnosis present

## 2020-01-25 DIAGNOSIS — N183 Chronic kidney disease, stage 3 unspecified: Secondary | ICD-10-CM | POA: Diagnosis present

## 2020-01-25 DIAGNOSIS — I671 Cerebral aneurysm, nonruptured: Secondary | ICD-10-CM | POA: Diagnosis present

## 2020-01-25 DIAGNOSIS — B952 Enterococcus as the cause of diseases classified elsewhere: Secondary | ICD-10-CM | POA: Diagnosis present

## 2020-01-25 HISTORY — PX: EMBOLECTOMY: SHX44

## 2020-01-25 LAB — ECHOCARDIOGRAM COMPLETE
Height: 63 in
Weight: 1712 oz

## 2020-01-25 LAB — TSH: TSH: 0.372 u[IU]/mL (ref 0.350–4.500)

## 2020-01-25 LAB — POC SARS CORONAVIRUS 2 AG -  ED
SARS Coronavirus 2 Ag: NEGATIVE
SARS Coronavirus 2 Ag: POSITIVE — AB

## 2020-01-25 LAB — HEPARIN LEVEL (UNFRACTIONATED): Heparin Unfractionated: 0.62 IU/mL (ref 0.30–0.70)

## 2020-01-25 LAB — SARS CORONAVIRUS 2 BY RT PCR (HOSPITAL ORDER, PERFORMED IN ~~LOC~~ HOSPITAL LAB): SARS Coronavirus 2: NEGATIVE

## 2020-01-25 SURGERY — EMBOLECTOMY
Anesthesia: General | Site: Leg Lower | Laterality: Bilateral

## 2020-01-25 MED ORDER — FLECAINIDE ACETATE 50 MG PO TABS
50.0000 mg | ORAL_TABLET | Freq: Two times a day (BID) | ORAL | Status: DC
  Administered 2020-01-25 – 2020-02-10 (×33): 50 mg via ORAL
  Filled 2020-01-25 (×33): qty 1

## 2020-01-25 MED ORDER — EPHEDRINE SULFATE 50 MG/ML IJ SOLN
INTRAMUSCULAR | Status: DC | PRN
  Administered 2020-01-25 (×2): 10 mg via INTRAVENOUS
  Administered 2020-01-25: 15 mg via INTRAVENOUS
  Administered 2020-01-25: 5 mg via INTRAVENOUS

## 2020-01-25 MED ORDER — ACETAMINOPHEN 650 MG RE SUPP: 650.0000 mg | Freq: Four times a day (QID) | RECTAL | Status: DC | PRN

## 2020-01-25 MED ORDER — ESMOLOL HCL 100 MG/10ML IV SOLN
INTRAVENOUS | Status: DC | PRN
  Administered 2020-01-25 (×2): 20 mg via INTRAVENOUS

## 2020-01-25 MED ORDER — 0.9 % SODIUM CHLORIDE (POUR BTL) OPTIME
TOPICAL | Status: DC | PRN
  Administered 2020-01-25: 2000 mL

## 2020-01-25 MED ORDER — ALPRAZOLAM 0.5 MG PO TABS
0.5000 mg | ORAL_TABLET | Freq: Three times a day (TID) | ORAL | Status: DC | PRN
  Administered 2020-01-28 – 2020-02-10 (×16): 0.5 mg via ORAL
  Filled 2020-01-25 (×18): qty 1

## 2020-01-25 MED ORDER — ONDANSETRON HCL 4 MG/2ML IJ SOLN
4.0000 mg | Freq: Four times a day (QID) | INTRAMUSCULAR | Status: DC | PRN
  Administered 2020-02-05: 4 mg via INTRAVENOUS
  Filled 2020-01-25: qty 2

## 2020-01-25 MED ORDER — IOHEXOL 350 MG/ML SOLN
75.0000 mL | Freq: Once | INTRAVENOUS | Status: AC | PRN
  Administered 2020-01-25: 75 mL via INTRAVENOUS

## 2020-01-25 MED ORDER — FLUOXETINE HCL 20 MG PO CAPS
20.0000 mg | ORAL_CAPSULE | Freq: Two times a day (BID) | ORAL | Status: DC
  Administered 2020-01-25 – 2020-02-10 (×33): 20 mg via ORAL
  Filled 2020-01-25 (×33): qty 1

## 2020-01-25 MED ORDER — ROCURONIUM BROMIDE 10 MG/ML (PF) SYRINGE
PREFILLED_SYRINGE | INTRAVENOUS | Status: AC
  Filled 2020-01-25: qty 10

## 2020-01-25 MED ORDER — PROPOFOL 10 MG/ML IV BOLUS
INTRAVENOUS | Status: DC | PRN
Start: 2020-01-25 — End: 2020-01-25
  Administered 2020-01-25: 90 mg via INTRAVENOUS

## 2020-01-25 MED ORDER — METOPROLOL SUCCINATE ER 25 MG PO TB24
25.0000 mg | ORAL_TABLET | Freq: Every day | ORAL | Status: DC
  Administered 2020-01-25 – 2020-02-09 (×16): 25 mg via ORAL
  Filled 2020-01-25 (×16): qty 1

## 2020-01-25 MED ORDER — HEPARIN BOLUS VIA INFUSION
2000.0000 [IU] | Freq: Once | INTRAVENOUS | Status: AC
  Administered 2020-01-25: 2000 [IU] via INTRAVENOUS
  Filled 2020-01-25: qty 2000

## 2020-01-25 MED ORDER — ONDANSETRON HCL 4 MG/2ML IJ SOLN
INTRAMUSCULAR | Status: AC
  Filled 2020-01-25: qty 2

## 2020-01-25 MED ORDER — DEXAMETHASONE SODIUM PHOSPHATE 10 MG/ML IJ SOLN
INTRAMUSCULAR | Status: DC | PRN
  Administered 2020-01-25: 10 mg via INTRAVENOUS

## 2020-01-25 MED ORDER — FENTANYL CITRATE (PF) 100 MCG/2ML IJ SOLN
25.0000 ug | INTRAMUSCULAR | Status: DC | PRN
  Administered 2020-01-25: 25 ug via INTRAVENOUS

## 2020-01-25 MED ORDER — ONDANSETRON HCL 4 MG PO TABS
4.0000 mg | ORAL_TABLET | Freq: Four times a day (QID) | ORAL | Status: DC | PRN
  Administered 2020-02-02: 4 mg via ORAL
  Filled 2020-01-25: qty 1

## 2020-01-25 MED ORDER — LIDOCAINE HCL (CARDIAC) PF 100 MG/5ML IV SOSY
PREFILLED_SYRINGE | INTRAVENOUS | Status: DC | PRN
  Administered 2020-01-25: 40 mg via INTRATRACHEAL

## 2020-01-25 MED ORDER — OXYCODONE HCL 5 MG PO TABS: 5.0000 mg | ORAL_TABLET | Freq: Once | ORAL | Status: DC | PRN

## 2020-01-25 MED ORDER — SUCCINYLCHOLINE CHLORIDE 200 MG/10ML IV SOSY
PREFILLED_SYRINGE | INTRAVENOUS | Status: AC
  Filled 2020-01-25: qty 10

## 2020-01-25 MED ORDER — SODIUM CHLORIDE 0.9% FLUSH
3.0000 mL | Freq: Two times a day (BID) | INTRAVENOUS | Status: DC
  Administered 2020-01-25 – 2020-02-10 (×26): 3 mL via INTRAVENOUS

## 2020-01-25 MED ORDER — SODIUM CHLORIDE 0.9 % IV SOLN
INTRAVENOUS | Status: DC | PRN
  Administered 2020-01-25: 500 mL

## 2020-01-25 MED ORDER — EPHEDRINE 5 MG/ML INJ
INTRAVENOUS | Status: AC
  Filled 2020-01-25: qty 10

## 2020-01-25 MED ORDER — MORPHINE SULFATE (PF) 2 MG/ML IV SOLN
2.0000 mg | INTRAVENOUS | Status: DC | PRN
  Administered 2020-01-25 – 2020-01-30 (×6): 2 mg via INTRAVENOUS
  Filled 2020-01-25 (×6): qty 1

## 2020-01-25 MED ORDER — CEFAZOLIN SODIUM-DEXTROSE 2-3 GM-%(50ML) IV SOLR
INTRAVENOUS | Status: DC | PRN
  Administered 2020-01-25: 2 g via INTRAVENOUS

## 2020-01-25 MED ORDER — FENTANYL CITRATE (PF) 100 MCG/2ML IJ SOLN
INTRAMUSCULAR | Status: AC
  Filled 2020-01-25: qty 2

## 2020-01-25 MED ORDER — HEPARIN SODIUM (PORCINE) 1000 UNIT/ML IJ SOLN
INTRAMUSCULAR | Status: DC | PRN
Start: 2020-01-25 — End: 2020-01-25
  Administered 2020-01-25: 4000 [IU] via INTRAVENOUS

## 2020-01-25 MED ORDER — LACTATED RINGERS IV SOLN: INTRAVENOUS | Status: DC | PRN

## 2020-01-25 MED ORDER — HEPARIN (PORCINE) 25000 UT/250ML-% IV SOLN
750.0000 [IU]/h | INTRAVENOUS | Status: DC
  Administered 2020-01-25: 750 [IU]/h via INTRAVENOUS
  Filled 2020-01-25: qty 250

## 2020-01-25 MED ORDER — ACETAMINOPHEN 500 MG PO TABS: 1000.0000 mg | ORAL_TABLET | Freq: Once | ORAL | Status: DC | PRN

## 2020-01-25 MED ORDER — PROPOFOL 10 MG/ML IV BOLUS
INTRAVENOUS | Status: AC
  Filled 2020-01-25: qty 20

## 2020-01-25 MED ORDER — ESMOLOL HCL 100 MG/10ML IV SOLN
INTRAVENOUS | Status: AC
  Filled 2020-01-25: qty 10

## 2020-01-25 MED ORDER — LIDOCAINE 2% (20 MG/ML) 5 ML SYRINGE
INTRAMUSCULAR | Status: AC
  Filled 2020-01-25: qty 5

## 2020-01-25 MED ORDER — ACETAMINOPHEN 10 MG/ML IV SOLN: 1000.0000 mg | Freq: Once | INTRAVENOUS | Status: DC | PRN

## 2020-01-25 MED ORDER — OXYCODONE HCL 5 MG PO TABS
5.0000 mg | ORAL_TABLET | ORAL | Status: DC | PRN
  Administered 2020-01-25 – 2020-02-10 (×27): 5 mg via ORAL
  Filled 2020-01-25 (×34): qty 1

## 2020-01-25 MED ORDER — OXYCODONE HCL 5 MG/5ML PO SOLN: 5.0000 mg | Freq: Once | ORAL | Status: DC | PRN

## 2020-01-25 MED ORDER — CLOPIDOGREL BISULFATE 75 MG PO TABS
75.0000 mg | ORAL_TABLET | Freq: Every day | ORAL | Status: DC
  Filled 2020-01-25: qty 1

## 2020-01-25 MED ORDER — DEXAMETHASONE SODIUM PHOSPHATE 10 MG/ML IJ SOLN
INTRAMUSCULAR | Status: AC
  Filled 2020-01-25: qty 1

## 2020-01-25 MED ORDER — SODIUM CHLORIDE 0.9 % IV SOLN
INTRAVENOUS | Status: AC
  Filled 2020-01-25: qty 1.2

## 2020-01-25 MED ORDER — SUGAMMADEX SODIUM 200 MG/2ML IV SOLN
INTRAVENOUS | Status: DC | PRN
  Administered 2020-01-25: 200 mg via INTRAVENOUS

## 2020-01-25 MED ORDER — CHLORHEXIDINE GLUCONATE CLOTH 2 % EX PADS
6.0000 | MEDICATED_PAD | Freq: Every day | CUTANEOUS | Status: DC
  Administered 2020-01-25 – 2020-02-10 (×12): 6 via TOPICAL

## 2020-01-25 MED ORDER — ONDANSETRON HCL 4 MG/2ML IJ SOLN
INTRAMUSCULAR | Status: DC | PRN
  Administered 2020-01-25: 4 mg via INTRAVENOUS

## 2020-01-25 MED ORDER — FENTANYL CITRATE (PF) 250 MCG/5ML IJ SOLN
INTRAMUSCULAR | Status: DC | PRN
  Administered 2020-01-25 (×2): 100 ug via INTRAVENOUS

## 2020-01-25 MED ORDER — FENTANYL CITRATE (PF) 250 MCG/5ML IJ SOLN
INTRAMUSCULAR | Status: AC
  Filled 2020-01-25: qty 5

## 2020-01-25 MED ORDER — ACETAMINOPHEN 160 MG/5ML PO SOLN: 1000.0000 mg | Freq: Once | ORAL | Status: DC | PRN

## 2020-01-25 MED ORDER — PANTOPRAZOLE SODIUM 40 MG PO TBEC
40.0000 mg | DELAYED_RELEASE_TABLET | Freq: Every day | ORAL | Status: DC
  Administered 2020-01-25 – 2020-02-10 (×17): 40 mg via ORAL
  Filled 2020-01-25 (×17): qty 1

## 2020-01-25 MED ORDER — ROCURONIUM BROMIDE 100 MG/10ML IV SOLN
INTRAVENOUS | Status: DC | PRN
Start: 2020-01-25 — End: 2020-01-25
  Administered 2020-01-25: 60 mg via INTRAVENOUS
  Administered 2020-01-25 (×2): 20 mg via INTRAVENOUS

## 2020-01-25 MED ORDER — ACETAMINOPHEN 325 MG PO TABS
650.0000 mg | ORAL_TABLET | Freq: Four times a day (QID) | ORAL | Status: DC | PRN
  Administered 2020-01-27 – 2020-02-05 (×8): 650 mg via ORAL
  Filled 2020-01-25 (×10): qty 2

## 2020-01-25 SURGICAL SUPPLY — 61 items
ADH SKN CLS APL DERMABOND .7 (GAUZE/BANDAGES/DRESSINGS) ×2
BANDAGE ESMARK 6X9 LF (GAUZE/BANDAGES/DRESSINGS) IMPLANT
BNDG CMPR 9X6 STRL LF SNTH (GAUZE/BANDAGES/DRESSINGS)
BNDG ELASTIC 4X5.8 VLCR STR LF (GAUZE/BANDAGES/DRESSINGS) ×2 IMPLANT
BNDG ESMARK 6X9 LF (GAUZE/BANDAGES/DRESSINGS)
CANISTER SUCT 3000ML PPV (MISCELLANEOUS) ×2 IMPLANT
CATH EMB 3FR 80CM (CATHETERS) ×1 IMPLANT
CATH EMB 4FR 80CM (CATHETERS) ×1 IMPLANT
CATH EMB 5FR 80CM (CATHETERS) IMPLANT
CLIP LIGATING EXTRA MED SLVR (CLIP) ×2 IMPLANT
CLIP LIGATING EXTRA SM BLUE (MISCELLANEOUS) ×2 IMPLANT
CNTNR URN SCR LID CUP LEK RST (MISCELLANEOUS) IMPLANT
CONT SPEC 4OZ STRL OR WHT (MISCELLANEOUS) ×4
COVER WAND RF STERILE (DRAPES) ×2 IMPLANT
CUFF TOURN SGL QUICK 34 (TOURNIQUET CUFF)
CUFF TOURN SGL QUICK 42 (TOURNIQUET CUFF) IMPLANT
CUFF TRNQT CYL 34X4.125X (TOURNIQUET CUFF) IMPLANT
DERMABOND ADVANCED (GAUZE/BANDAGES/DRESSINGS) ×2
DERMABOND ADVANCED .7 DNX12 (GAUZE/BANDAGES/DRESSINGS) ×1 IMPLANT
DRAIN SNY 10X20 3/4 PERF (WOUND CARE) IMPLANT
DRAPE X-RAY CASS 24X20 (DRAPES) IMPLANT
ELECT REM PT RETURN 9FT ADLT (ELECTROSURGICAL) ×2
ELECTRODE REM PT RTRN 9FT ADLT (ELECTROSURGICAL) ×1 IMPLANT
EVACUATOR SILICONE 100CC (DRAIN) IMPLANT
GAUZE SPONGE 4X4 12PLY STRL (GAUZE/BANDAGES/DRESSINGS) ×2 IMPLANT
GLOVE BIO SURGEON STRL SZ 6.5 (GLOVE) ×2 IMPLANT
GLOVE BIOGEL M STRL SZ7.5 (GLOVE) ×1 IMPLANT
GLOVE BIOGEL PI IND STRL 7.0 (GLOVE) IMPLANT
GLOVE BIOGEL PI IND STRL 7.5 (GLOVE) IMPLANT
GLOVE BIOGEL PI INDICATOR 7.0 (GLOVE) ×2
GLOVE BIOGEL PI INDICATOR 7.5 (GLOVE) ×3
GLOVE ECLIPSE 7.0 STRL STRAW (GLOVE) ×1 IMPLANT
GLOVE SS BIOGEL STRL SZ 7.5 (GLOVE) ×1 IMPLANT
GLOVE SUPERSENSE BIOGEL SZ 7.5 (GLOVE) ×1
GOWN STRL REUS W/ TWL LRG LVL3 (GOWN DISPOSABLE) ×3 IMPLANT
GOWN STRL REUS W/TWL LRG LVL3 (GOWN DISPOSABLE) ×8
KIT BASIN OR (CUSTOM PROCEDURE TRAY) ×2 IMPLANT
KIT TURNOVER KIT B (KITS) ×2 IMPLANT
NS IRRIG 1000ML POUR BTL (IV SOLUTION) ×4 IMPLANT
PACK PERIPHERAL VASCULAR (CUSTOM PROCEDURE TRAY) ×2 IMPLANT
PAD ARMBOARD 7.5X6 YLW CONV (MISCELLANEOUS) ×4 IMPLANT
PADDING CAST COTTON 6X4 STRL (CAST SUPPLIES) IMPLANT
SET COLLECT BLD 21X3/4 12 (NEEDLE) IMPLANT
STAPLER VISISTAT 35W (STAPLE) IMPLANT
STOPCOCK 4 WAY LG BORE MALE ST (IV SETS) IMPLANT
SUT ETHILON 3 0 PS 1 (SUTURE) IMPLANT
SUT PROLENE 5 0 C 1 24 (SUTURE) ×2 IMPLANT
SUT PROLENE 6 0 BV (SUTURE) ×2 IMPLANT
SUT PROLENE 6 0 CC (SUTURE) ×5 IMPLANT
SUT VIC AB 2-0 CT1 27 (SUTURE) ×4
SUT VIC AB 2-0 CT1 TAPERPNT 27 (SUTURE) IMPLANT
SUT VIC AB 2-0 CTX 36 (SUTURE) ×2 IMPLANT
SUT VIC AB 3-0 SH 27 (SUTURE) ×4
SUT VIC AB 3-0 SH 27X BRD (SUTURE) ×1 IMPLANT
SYR 3ML LL SCALE MARK (SYRINGE) ×2 IMPLANT
SYR TB 1ML LUER SLIP (SYRINGE) ×1 IMPLANT
TOWEL GREEN STERILE (TOWEL DISPOSABLE) ×2 IMPLANT
TRAY FOLEY MTR SLVR 16FR STAT (SET/KITS/TRAYS/PACK) ×2 IMPLANT
TUBING EXTENTION W/L.L. (IV SETS) IMPLANT
UNDERPAD 30X36 HEAVY ABSORB (UNDERPADS AND DIAPERS) ×2 IMPLANT
WATER STERILE IRR 1000ML POUR (IV SOLUTION) ×2 IMPLANT

## 2020-01-25 NOTE — Anesthesia Postprocedure Evaluation (Signed)
Anesthesia Post Note  Patient: CARMINA WALLE  Procedure(s) Performed: Bilateral popliteal EMBOLECTOMY. (Bilateral Leg Lower)     Patient location during evaluation: PACU Anesthesia Type: General Level of consciousness: awake and alert Pain management: pain level controlled Vital Signs Assessment: post-procedure vital signs reviewed and stable Respiratory status: spontaneous breathing, nonlabored ventilation, respiratory function stable and patient connected to nasal cannula oxygen Cardiovascular status: blood pressure returned to baseline and stable Postop Assessment: no apparent nausea or vomiting Anesthetic complications: no    Last Vitals:  Vitals:   01/25/20 0907 01/25/20 0925  BP: (!) 161/58 (!) 159/70  Pulse: 60 67  Resp: 15 15  Temp: 36.5 C 36.6 C  SpO2: 93% 97%    Last Pain:  Vitals:   01/25/20 0925  TempSrc: Oral  PainSc:                  Kingdavid Leinbach S

## 2020-01-25 NOTE — Anesthesia Preprocedure Evaluation (Addendum)
Anesthesia Evaluation  Patient identified by MRN, date of birth, ID band Patient awake    Reviewed: Allergy & Precautions, H&P , NPO status , Patient's Chart, lab work & pertinent test results, reviewed documented beta blocker date and time   History of Anesthesia Complications Negative for: history of anesthetic complications  Airway Mallampati: II   Neck ROM: full    Dental   Pulmonary COPD, Current Smoker and Patient abstained from smoking.,    breath sounds clear to auscultation       Cardiovascular hypertension, Pt. on medications and Pt. on home beta blockers + Peripheral Vascular Disease  + dysrhythmias  Rhythm:regular Rate:Normal  2017 tte:  Left ventricle: The cavity size was normal. Wall thickness was  increased in a pattern of mild LVH. Systolic function was normal.  The estimated ejection fraction was in the range of 60% to 65%.  Wall motion was normal; there were no regional wall motion  abnormalities. Features are consistent with a pseudonormal left  ventricular filling pattern, with concomitant abnormal relaxation  and increased filling pressure (grade 2 diastolic dysfunction).  - Aortic valve: There was mild regurgitation.  - Mitral valve: There was mild regurgitation.  - Left atrium: The atrium was mildly dilated.  - Right atrium: Central venous pressure (est): 3 mm Hg.  - Tricuspid valve: There was trivial regurgitation.  - Pulmonary arteries: Systolic pressure could not be accurately  estimated.  - Pericardium, extracardiac: There was no pericardial effusion.    Neuro/Psych  Headaches, PSYCHIATRIC DISORDERS Anxiety  Neuromuscular disease CVA    GI/Hepatic Neg liver ROS, GERD  Medicated and Controlled,  Endo/Other  negative endocrine ROS  Renal/GU Renal InsufficiencyRenal disease     Musculoskeletal   Abdominal   Peds  Hematology  (+) Blood dyscrasia, anemia , Plavix   Anesthesia  Other Findings   Reproductive/Obstetrics                            Anesthesia Physical Anesthesia Plan  ASA: III  Anesthesia Plan: General   Post-op Pain Management:    Induction: Intravenous  PONV Risk Score and Plan: 2 and Ondansetron and Dexamethasone  Airway Management Planned: Oral ETT  Additional Equipment: None  Intra-op Plan:   Post-operative Plan: Extubation in OR  Informed Consent: I have reviewed the patients History and Physical, chart, labs and discussed the procedure including the risks, benefits and alternatives for the proposed anesthesia with the patient or authorized representative who has indicated his/her understanding and acceptance.     Dental advisory given  Plan Discussed with: CRNA and Surgeon  Anesthesia Plan Comments:         Anesthesia Quick Evaluation

## 2020-01-25 NOTE — Progress Notes (Signed)
  Echocardiogram 2D Echocardiogram has been performed.  Barbara Hill 01/25/2020, 1:37 PM

## 2020-01-25 NOTE — Consult Note (Signed)
[facility] [place]  Patient name: Barbara Hill MRN: [id number] DOB: [birth year] Sex: female    HPI: Barbara Hill is a 79 y.o. female seen in the emergency department for right leg pain.  She had no prior history of this.  On the evening before this consultation she had a sudden onset of pain in her right calf.  This was severe and she called EMS and was transported to [facility].  She was found to have Doppler flow at that time and her pain completely resolved.  I discussed this by telephone with the emergency room physician and she was to be discharged with Krishna Heuer office visit with [place].  While in the emergency department she began having pain again and I suggested a CT scan of her abdomen pelvis and runoff for further evaluation.  She continued to be intact with motor and sensory function.  CT scan showed bilateral popliteal embolus.  She does have a history of paroxysmal supraventricular tachycardia.  She has palpitations has worn a heart monitor.  She is not on anticoagulation.  The patient reports that last week she called EMS because she was having alterations of heart rate from 60 to the 130s.  When they arrived for evaluation this had resolved and she elected not to come to the emergency department for further evaluation.  She does have active follow-up with cardiology.  Past medical history is noted for COPD.  She does continue to smoke cigarettes.  Did have a brain aneurysm stented with postop stroke complication in 2017.  Past Medical History:  Diagnosis Date  . Anxiety   . Brain aneurysm    pipeline stent 2017   . COPD (chronic obstructive pulmonary disease) (HCC)   . Fatigue   . H/O: hysterectomy   . Headache(784.0)   . History of cardiac catheterization    LHC 10/02: Normal coronary arteries  . History of nuclear stress test    Myoview 10/12: Normal stress nuclear study.  . Hypertension   . Mitral valve  prolapse    Echo 8/17:Mild LVH, EF 60-65, normal wall motion, grade 2 diastolic dysfunction, mild AI, mild MR, mild LAE // echo 11/01: EF 60%, mild anterior MVP with trivial MR, PASP 35-40 mmHg, LAE  . Orthostasis   . PSVT (paroxysmal supraventricular tachycardia) (HCC)    palps  . Radiculopathy     Family History  Problem Relation Age of Onset  . Heart attack Mother   . Transient ischemic attack Mother   . Heart attack Father 34  . Heart attack Other   . Asthma Other   . Heart failure Other   . Osteoporosis Other   . Heart Problems Brother   . CAD Other     SOCIAL HISTORY: Social History   Tobacco Use  . Smoking status: Current Every Day Smoker    Packs/day: 1.00    Years: 50.00    Pack years: 50.00    Types: Cigarettes  . Smokeless tobacco: Current User  Substance Use Topics  . Alcohol use: No    Allergies  Allergen Reactions  . Dobutamine Other (See Comments)    Heart beating hard with CP, neck pain, and weakness  . Epinephrine Other (See Comments)    Fast heart  beat  . Darvocet [Propoxyphene N-Acetaminophen] Nausea And Vomiting  . Excedrin Extra Strength [Aspirin-Acetaminophen-Caffeine] Nausea And Vomiting  . Aspirin   . Clarithromycin Other (See Comments)    Yeast infection  . Doxycycline Other (See Comments)    Makes stomach hurt  . Penicillins Other (See Comments)    Yeast infection  Has patient had a PCN reaction causing immediate rash, facial/tongue/throat swelling, SOB or lightheadedness with hypotension: No Has patient had a PCN reaction causing severe rash involving mucus membranes or skin necrosis: No Has patient had a PCN reaction that required hospitalization No Has patient had a PCN reaction occurring within the last 10 years: No If all of the above answers are "NO", then may proceed with Cephalosporin use.   . Prednisone Other (See Comments)    Shakes   . Propoxyphene Nausea And Vomiting    Current Facility-Administered Medications    Medication Dose Route Frequency Provider Last Rate Last Admin  . heparin ADULT infusion 100 units/mL (25000 units/217mL sodium chloride 0.45%)  750 Units/hr Intravenous Continuous Mesner, Jason, MD 7.5 mL/hr at 01/25/20 0049 750 Units/hr at 01/25/20 0049   Current Outpatient Medications  Medication Sig Dispense Refill  . acetaminophen (TYLENOL) 650 MG CR tablet Take 1,300 mg by mouth every 8 (eight) hours as needed for pain.    Marland Kitchen ALPRAZolam (XANAX) 0.5 MG tablet Take 0.5 mg by mouth 3 (three) times daily.     . butalbital-acetaminophen-caffeine (FIORICET, ESGIC) 50-325-40 MG per tablet Take 1-2 tablets by mouth every 4 (four) hours as needed for headache or migraine.   3  . clopidogrel (PLAVIX) 75 MG tablet Take 75 mg by mouth daily.  3  . flecainide (TAMBOCOR) 50 MG tablet Take 1 tablet (50 mg total) by mouth 2 (two) times daily. 180 tablet 3  . FLUoxetine (PROZAC) 20 MG capsule Take 20 mg by mouth 2 (two) times daily.  11  . fluticasone (FLONASE) 50 MCG/ACT nasal spray Place 1 spray into both nostrils daily as needed for allergies or rhinitis.   11  . losartan (COZAAR) 25 MG tablet TAKE 1 TABLET BY MOUTH EVERY DAY (Patient taking differently: Take 25 mg by mouth daily. ) 90 tablet 0  . metoprolol succinate (TOPROL-XL) 25 MG 24 hr tablet TAKE 1 TABLET BY MOUTH EVERYDAY AT BEDTIME (Patient taking differently: Take 25 mg by mouth at bedtime. ) 90 tablet 2  . omeprazole (PRILOSEC) 40 MG capsule Take 40 mg by mouth daily.      . Probiotic Product (PROBIOTIC PO) Take liquid probiotic by mouth twice daily (Activia)     . cholecalciferol (VITAMIN D) 1000 units tablet Take 1,000 Units by mouth daily.      REVIEW OF SYSTEMS:  [X]  denotes positive finding, [ ]  denotes negative finding Cardiac  Comments:  Chest pain or chest pressure:    Shortness of breath upon exertion:    Short of breath when lying flat:    Irregular heart rhythm: x       Vascular    Pain in calf, thigh, or hip brought on by  ambulation:    Pain in feet at night that wakes you up from your sleep:     Blood clot in your veins:    Leg swelling:           PHYSICAL EXAM: Vitals:   01/24/20 2315 01/25/20 0215 01/25/20 0300 01/25/20 0315  BP: (!) 150/132 (!) 217/84 (!) 194/75 (!) 188/78  Pulse: (!) 59 (!) 53 (!) 54 Marland Kitchen)  53  Resp: 15  16 15   Temp:      TempSrc:      SpO2: 97% 95% 95% 95%  Weight:      Height:        GENERAL: The patient is a well-nourished female, in no acute distress. The vital signs are documented above. CARDIOVASCULAR: 2+ radial and 2+ femoral pulses bilaterally.  Palpable left popliteal pulse I do not palpate a right popliteal pulse PULMONARY: There is good air exchange  MUSCULOSKELETAL: There are no major deformities or cyanosis. NEUROLOGIC: No focal weakness or paresthesias are detected. SKIN: There are no ulcers or rashes noted. PSYCHIATRIC: The patient has a normal affect.  DATA:  CT abdomen pelvis and runoff reveals no evidence of occlusive disease in her aorta iliac segments or in her superficial femoral artery.  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  Her dominant runoff vessel is her posterior tibial  MEDICAL ISSUES: Bilateral lower extremity embolus to the popliteal arteries.  Not symptomatic on the left.  Discussed this in detail with the patient and by telephone with her daughter.  Will take immediately to the operating room for bilateral popliteal embolectomy.  Suspect this is related to cardiac embolus.  Of note, the patient has not had Covid vaccination.  There is some confusion in the lab regarding her point-of-care testing for Covid.  There is a negative and potentially a positive test.  The more reliable rapid test is pending.  Since she is motor and sensory intact, will wait another 30 to 45 minutes for the definitive Covid testing  return.    , MD FACS [facility] Tel [phone] Pager [phone]

## 2020-01-25 NOTE — Progress Notes (Addendum)
ANTICOAGULATION CONSULT NOTE - Follow Up Consult  Pharmacy Consult for Heparin Indication: s/p BL popliteal embolectomy  Allergies  Allergen Reactions  . Dobutamine Other (See Comments)    Heart beating hard with CP, neck pain, and weakness  . Epinephrine Other (See Comments)    Fast heart beat  . Darvocet [Propoxyphene N-Acetaminophen] Nausea And Vomiting  . Excedrin Extra Strength [Aspirin-Acetaminophen-Caffeine] Nausea And Vomiting  . Aspirin   . Clarithromycin Other (See Comments)    Yeast infection  . Doxycycline Other (See Comments)    Makes stomach hurt  . Penicillins Other (See Comments)    Yeast infection  Has patient had a PCN reaction causing immediate rash, facial/tongue/throat swelling, SOB or lightheadedness with hypotension: No Has patient had a PCN reaction causing severe rash involving mucus membranes or skin necrosis: No Has patient had a PCN reaction that required hospitalization No Has patient had a PCN reaction occurring within the last 10 years: No If all of the above answers are "NO", then may proceed with Cephalosporin use.   . Prednisone Other (See Comments)    Shakes   . Propoxyphene Nausea And Vomiting    Patient Measurements: Height: 5\' 3"  (160 cm) Weight: 48.5 kg (107 lb) IBW/kg (Calculated) : 52.4 Heparin Dosing Weight:  48.5 kg  Vital Signs: Temp: 97.4 F (36.3 C) (06/03 1922) Temp Source: Oral (06/03 1922) BP: 90/53 (06/03 1922) Pulse Rate: 70 (06/03 1922)  Labs: Recent Labs    01/24/20 2200 01/25/20 1947  HGB 10.0*  --   HCT 31.5*  --   PLT 243  --   HEPARINUNFRC  --  0.62  CREATININE 1.21*  --     Estimated Creatinine Clearance: 29.8 mL/min (A) (by C-G formula based on SCr of 1.21 mg/dL (H)).  Assessment: 79 yo f presenting with leg pain s/p Bilateral popliteal exploration and embolectomy on 6/3. IV heparin resumed this morning post/op. Heparin level therapeutic at 0.62.  Goal of Therapy:  Heparin level 0.3-0.7  units/ml Monitor platelets by anticoagulation protocol: Yes   Plan:  -Continue heparin 750 units/h -Confirmatory level with morning labs   8/3, PharmD, BCPS Clinical Pharmacist [phone] Please check AMION for all [facility] numbers 01/25/2020

## 2020-01-25 NOTE — Progress Notes (Signed)
ANTICOAGULATION CONSULT NOTE - Follow Up Consult  Pharmacy Consult for Heparin Indication: s/p BL popliteal embolectomy  Allergies  Allergen Reactions  . Dobutamine Other (See Comments)    Heart beating hard with CP, neck pain, and weakness  . Epinephrine Other (See Comments)    Fast heart beat  . Darvocet [Propoxyphene N-Acetaminophen] Nausea And Vomiting  . Excedrin Extra Strength [Aspirin-Acetaminophen-Caffeine] Nausea And Vomiting  . Aspirin   . Clarithromycin Other (See Comments)    Yeast infection  . Doxycycline Other (See Comments)    Makes stomach hurt  . Penicillins Other (See Comments)    Yeast infection  Has patient had a PCN reaction causing immediate rash, facial/tongue/throat swelling, SOB or lightheadedness with hypotension: No Has patient had a PCN reaction causing severe rash involving mucus membranes or skin necrosis: No Has patient had a PCN reaction that required hospitalization No Has patient had a PCN reaction occurring within the last 10 years: No If all of the above answers are "NO", then may proceed with Cephalosporin use.   . Prednisone Other (See Comments)    Shakes   . Propoxyphene Nausea And Vomiting    Patient Measurements: Height: 5\' 3"  (160 cm) Weight: 48.5 kg (107 lb) IBW/kg (Calculated) : 52.4 Heparin Dosing Weight:  48.5 kg  Vital Signs: Temp: 97.8 F (36.6 C) (06/03 0925) Temp Source: Oral (06/03 0925) BP: 159/70 (06/03 0925) Pulse Rate: 67 (06/03 0925)  Labs: Recent Labs    01/24/20 2200  HGB 10.0*  HCT 31.5*  PLT 243  CREATININE 1.21*    Estimated Creatinine Clearance: 29.8 mL/min (A) (by C-G formula based on SCr of 1.21 mg/dL (H)).  Assessment: CC/HPI: 79 yo f presenting with leg pain s/p Bilateral popliteal exploration and embolectomy on 6/3.  PMH: HTN, SVT, COPD,,brain aneurysm stented with postop stroke complication in 2017., +tobacco, anxiety,   Significant events: Not vaccinated  Anticoag: s/p BL LE emboli to  popliteal arteria s/p embolectomy 6/3. Resume IV heparin. Baseline Hgb 10  Goal of Therapy:  Heparin level 0.3-0.7 units/ml Monitor platelets by anticoagulation protocol: Yes   Plan:  Resume heparin post-surgery at 1200 at 750 units/hr Check hep level 8 hrs later Daily HL and CBC    Barbara Hill S. 8/3, PharmD, BCPS Clinical Staff Pharmacist [email or web address] Merilynn [place], Antanasia Kaczynski Stillinger 01/25/2020,9:41 AM

## 2020-01-25 NOTE — Consult Note (Addendum)
Cardiology Consultation:   Patient ID: Barbara Hill MRN: [id number]; DOB: 06-Mar-1941  Admit date: 01/24/2020 Date of Consult: 01/25/2020  Primary Care Provider: Richmond Campbell., PA-C Primary Cardiologist: Dietrich Pates, MD  Primary Electrophysiologist:  None    Patient Profile:   Barbara Hill is a 79 y.o. 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 Popliteal exploration and embolectomy this AM who is being seen today for the evaluation of anticoagualtion at the request of Dr. Arbie Cookey .  History of Present Illness:   Ms. Jamison with above hx including SVT on flecainide and recent ZIO patch monitor for 2 weeks with no significant arrhythmias - mostly SR with PACs, baseline has a significant artifact. Cardiac cath 2002 with normal coronary arteries, normal LV function, mild angiographic MVP,  Normal nuc in 2012 and last echo 2017 with EF 60-65% G2DD mild LVH, mild AR, mild MR, LA was mildly dilated.  Trivial TR.    Has been stable but presented to ER last night by EMS with Rt lower leg pain no pedal pulse in Rt foot.  L side pulse weak.  Pt stated she had sudden onset of pain in her Rt calf.  Severe.  On arrival at [facility] she had doppler flow and pain resolved.  She was to follow-up but before she left ER pain returned and CTA of abd and pelvis revealed bilateral popliteal embolus.  She did note this week she had called EMS due to HR from 60s to 130s.  But on their arrival she was in SR.    She was taken to OR   EKG:  The EKG was personally reviewed and demonstrates:  SB at 39 with 1st degree AV block 240 ms  Telemetry:  Telemetry was personally reviewed and demonstrates:  SR with PACs   Na 140, K+ 3.9, BUN 18 Cr. 1.21  hgb 10, WBC 6.1 and plts 243  Echo  pending  BP 159/70 to 137/54 P 62, resp 13  Currently drowsy from surgery.  No angina.   Past Medical History:  Diagnosis Date  . Anxiety   . Brain aneurysm    pipeline stent 2017   . COPD (chronic obstructive pulmonary disease) (HCC)   . Fatigue   . H/O: hysterectomy   . Headache(784.0)   . History of cardiac catheterization    LHC 10/02: Normal coronary arteries  . History of nuclear stress test    Myoview 10/12: Normal stress nuclear study.  . Hypertension   . Mitral valve prolapse    Echo 8/17:Mild LVH, EF 60-65, normal wall motion, grade 2 diastolic dysfunction, mild AI, mild MR, mild LAE // echo 11/01: EF 60%, mild anterior MVP with trivial MR, PASP 35-40 mmHg, LAE  . Orthostasis   . PSVT (paroxysmal supraventricular tachycardia) (HCC)    palps  . Radiculopathy     Past Surgical History:  Procedure Laterality Date  . ABDOMINAL HYSTERECTOMY    . APPENDECTOMY    . CARDIAC CATHETERIZATION  12/95  . CATARACT EXTRACTION    . CHOLECYSTECTOMY    . IR GENERIC HISTORICAL  06/02/2016   IR RADIOLOGIST EVAL & MGMT 06/02/2016 [facility]  . IR GENERIC HISTORICAL  06/29/2016   IR ANGIO INTRA EXTRACRAN SEL INTERNAL CAROTID UNI R MOD SED 06/29/2016 [name]  [name], MD [facility]  . IR GENERIC HISTORICAL  06/29/2016   IR ANGIOGRAM FOLLOW UP STUDY 06/29/2016 [name], MD [facility]  . IR GENERIC HISTORICAL  06/29/2016   IR NEURO EACH ADD'L AFTER BASIC UNI RIGHT (MS) 06/29/2016 [name], MD [facility]  . IR GENERIC HISTORICAL  06/29/2016   IR 3D INDEPENDENT WKST 06/29/2016 [name], MD [facility]  . IR GENERIC HISTORICAL  06/29/2016   IR TRANSCATH/EMBOLIZ 06/29/2016 [name], MD [facility]  . IR GENERIC HISTORICAL  06/29/2016   IR ANGIO VERTEBRAL SEL SUBCLAVIAN INNOMINATE UNI R MOD SED 06/29/2016 [name], MD [facility]  . IR GENERIC HISTORICAL  07/28/2016   IR RADIOLOGIST EVAL & MGMT 07/28/2016 [facility]  . IR GENERIC  HISTORICAL  11/12/2016   IR ANGIO INTRA EXTRACRAN SEL COM CAROTID INNOMINATE BILAT MOD SED 11/12/2016 [name], MD [facility]  . IR GENERIC HISTORICAL  11/12/2016   IR ANGIO VERTEBRAL SEL VERTEBRAL UNI L MOD SED 11/12/2016 [name], MD [facility]  . IR GENERIC HISTORICAL  11/12/2016   IR ANGIO VERTEBRAL SEL SUBCLAVIAN INNOMINATE UNI R MOD SED 11/12/2016 [name], MD [facility]  . NASAL SEPTUM SURGERY    . RADIOLOGY WITH ANESTHESIA N/A 06/29/2016   Procedure: EMBOLIZATION;  Surgeon: [name], MD;  Location: [facility] OR;  Service: Radiology;  Laterality: N/A;  . ROTATOR CUFF REPAIR       Home Medications:  Prior to Admission medications   Medication Sig Start Date End Date Taking? Authorizing Provider  acetaminophen (TYLENOL) 650 MG CR tablet Take 1,300 mg by mouth every 8 (eight) hours as needed for pain.   Yes [provider]  ALPRAZolam [name]) 0.5 MG tablet Take 0.5 mg by mouth 3 (three) times daily.    Yes [provider]  butalbital-acetaminophen-caffeine (FIORICET, ESGIC) 50-325-40 MG per tablet Take 1-2 tablets by mouth every 4 (four) hours as needed for headache or migraine.  07/06/14  Yes [provider]  clopidogrel (PLAVIX) 75 MG tablet Take 75 mg by mouth daily. 06/18/16  Yes [provider]  flecainide (TAMBOCOR) 50 MG tablet Take 1 tablet (50 mg total) by mouth 2 (two) times daily. 10/12/19  Yes Pricilla Riffle, MD  FLUoxetine (PROZAC) 20 MG capsule Take 20 mg by mouth 2 (two) times daily. 06/07/16  Yes [provider]  fluticasone (FLONASE) 50 MCG/ACT nasal spray Place 1 spray into both nostrils daily as needed for allergies or rhinitis.  10/11/14  Yes [provider]  losartan (COZAAR) 25 MG tablet TAKE 1 TABLET BY MOUTH EVERY DAY Patient taking differently: Take 25 mg by mouth daily.  11/28/18  Yes Weaver, Scott T, PA-C  metoprolol succinate (TOPROL-XL) 25 MG 24 hr tablet TAKE 1 TABLET BY MOUTH  EVERYDAY AT BEDTIME Patient taking differently: Take 25 mg by mouth at bedtime.  10/02/19  Yes Pricilla Riffle, MD  omeprazole (PRILOSEC) 40 MG capsule Take 40 mg by mouth daily.     Yes [provider]  Probiotic Product (PROBIOTIC PO) Take liquid probiotic by mouth twice daily (Activia)    Yes [provider]  cholecalciferol (VITAMIN D) 1000 units tablet Take 1,000 Units by mouth daily.    [provider]    Inpatient Medications: Scheduled Meds: . Chlorhexidine Gluconate Cloth  6 each Topical Daily  . [START ON 01/26/2020] clopidogrel  75 mg Oral Daily  . flecainide  50 mg Oral BID  . FLUoxetine  20 mg Oral  BID  . metoprolol succinate  25 mg Oral QHS  . pantoprazole  40 mg Oral Daily  . sodium chloride flush  3 mL Intravenous Q12H   Continuous Infusions: . heparin     PRN Meds: acetaminophen **OR** acetaminophen, ALPRAZolam, morphine injection, ondansetron **OR** ondansetron (ZOFRAN) IV, oxyCODONE  Allergies:    Allergies  Allergen Reactions  . Dobutamine Other (See Comments)    Heart beating hard with CP, neck pain, and weakness  . Epinephrine Other (See Comments)    Fast heart beat  . Darvocet [Propoxyphene N-Acetaminophen] Nausea And Vomiting  . Excedrin Extra Strength [Aspirin-Acetaminophen-Caffeine] Nausea And Vomiting  . Aspirin   . Clarithromycin Other (See Comments)    Yeast infection  . Doxycycline Other (See Comments)    Makes stomach hurt  . Penicillins Other (See Comments)    Yeast infection  Has patient had a PCN reaction causing immediate rash, facial/tongue/throat swelling, SOB or lightheadedness with hypotension: No Has patient had a PCN reaction causing severe rash involving mucus membranes or skin necrosis: No Has patient had a PCN reaction that required hospitalization No Has patient had a PCN reaction occurring within the last 10 years: No If all of the above answers are "NO", then may proceed with Cephalosporin use.   .  Prednisone Other (See Comments)    Shakes   . Propoxyphene Nausea And Vomiting    Social History:   Social History   Socioeconomic History  . Marital status: Widowed    Spouse name: Not on file  . Number of children: 2  . Years of education: 3112  . Highest education level: Not on file  Occupational History  . Occupation: Retired  Tobacco Use  . Smoking status: Current Every Day Smoker    Packs/day: 1.00    Years: 50.00    Pack years: 50.00    Types: Cigarettes  . Smokeless tobacco: Current User  Substance and Sexual Activity  . Alcohol use: No  . Drug use: No  . Sexual activity: Not on file  Other Topics Concern  . Not on file  Social History Narrative   Lives with significant other, [name]    Caffeine use: none   Social Determinants of Health   Financial Resource Strain:   . Difficulty of Paying Living Expenses:   Food Insecurity:   . Worried About Programme researcher, broadcasting/film/videounning Out of Food in the Last Year:   . Baristaan Out of Food in the Last Year:   Transportation Needs:   . Freight forwarderLack of Transportation (Medical):   Marland Kitchen. Lack of Transportation (Non-Medical):   Physical Activity:   . Days of Exercise per Week:   . Minutes of Exercise per Session:   Stress:   . Feeling of Stress :   Social Connections:   . Frequency of Communication with Friends and Family:   . Frequency of Social Gatherings with Friends and Family:   . Attends Religious Services:   . Active Member of Clubs or Organizations:   . Attends BankerClub or Organization Meetings:   Marland Kitchen. Marital Status:   Intimate Partner Violence:   . Fear of Current or Ex-Partner:   . Emotionally Abused:   Marland Kitchen. Physically Abused:   . Sexually Abused:     Family History:    Family History  Problem Relation Age of Onset  . Heart attack Mother   . Transient ischemic attack Mother   . Heart attack Father [id number]  . Heart attack Other   . Asthma Other   .  Heart failure Other   . Osteoporosis Other   . Heart Problems Brother   . CAD Other      ROS:   Please see the history of present illness.  General:no colds or fevers, no weight changes Skin:no rashes or ulcers HEENT:no blurred vision, no congestion CV:see HPI PUL:see HPI GI:no diarrhea constipation or melena, no indigestion GU:no hematuria, no dysuria MS:no joint pain, no claudication Neuro:no syncope, no lightheadedness Endo:no diabetes, no thyroid disease  All other ROS reviewed and negative.     Physical Exam/Data:   Vitals:   01/25/20 0836 01/25/20 0851 01/25/20 0907 01/25/20 0925  BP: (!) 173/75 (!) 162/61 (!) 161/58 (!) 159/70  Pulse: 63 62 60 67  Resp: 15 15 15 15   Temp:   97.7 F (36.5 C) 97.8 F (36.6 C)  TempSrc:    Oral  SpO2: 96% 94% 93% 97%  Weight:      Height:        Intake/Output Summary (Last 24 hours) at 01/25/2020 1059 Last data filed at 01/25/2020 0730 Gross per 24 hour  Intake 1048.12 ml  Output 345 ml  Net 703.12 ml   Last 3 Weights 01/24/2020 01/03/2020 04/10/2019  Weight (lbs) 107 lb 107 lb 6.4 oz 105 lb 1.9 oz  Weight (kg) 48.535 kg 48.716 kg 47.682 kg     Body mass index is 18.95 kg/m.  General:  Frail female, in no acute distress HEENT: normal Lymph: no adenopathy Neck: no JVD Endocrine:  No thryomegaly Vascular: No carotid bruits; I did not feel pedal pulse but both feet warm and per dopplers   Cardiac:  normal S1, S2; RRR;  1/6 systolic murmur no gallup rub or click Lungs:  clear to auscultation bilaterally, no wheezing, rhonchi or rales  Abd: soft, nontender, no hepatomegaly  Ext: no edema- feet equally warm Musculoskeletal:  No deformities, BUE and BLE strength normal and equal Skin: warm and dry  Neuro:  Drowsy but alert and oriented, though if she hears lowd noice seems to forget where she is , no focal abnormalities noted Psych:  Normal affect    Relevant CV Studies: Cath 2002 HEMODYNAMIC DATA: 1. Central aortic pressure was 165/76. 2. Left ventricular pressure 165/24.  ANGIOGRAPHIC DATA:  Left main coronary  artery was angiographically normal and bifurcated into an LAD and left circumflex system.  The LAD was angiographically normal and gave rise to a major proximal diagonal vessel and several small septal perforating arteries.  The circumflex vessel was angiographically normal and gave rise to a bifurcating obtuse marginal vessel.  The right coronary artery was angiographically normal and gave rise to a PDA and posterolateral vessel.  Biplane cine left ventriculography revealed normal LV function without focal segmental wall motion abnormalities.  There was a mild angiographic mitral valve prolapse.  Distal aortography did not demonstrate any significant aortoiliac disease. There was no evidence for renal artery stenosis.  IMPRESSION: 1. Normal left ventricular function. 2. Mild angiographic mitral valve prolapse. 3. Normal coronary arteries. 4. Normal aortoiliac system.  Echo 01/25/20  1. Left ventricular ejection fraction, by estimation, is 60 to 65%. The left ventricle has normal function. The left ventricle has no regional wall motion abnormalities. There is mild left ventricular hypertrophy. Left ventricular diastolic parameters are consistent with Grade II diastolic dysfunction (pseudonormalization). 2. Right ventricular systolic function is normal. The right ventricular size is normal. There is normal pulmonary artery systolic pressure. 3. The mitral valve is normal in structure. Trivial mitral valve regurgitation. No  evidence of mitral stenosis. 4. The aortic valve is normal in structure. Aortic valve regurgitation is mild. No aortic stenosis is Present.    Laboratory Data:  High Sensitivity Troponin:  No results for input(s): TROPONINIHS in the last 720 hours.   Chemistry Recent Labs  Lab 01/24/20 2200  NA 140  K 3.9  CL 104  CO2 28  GLUCOSE 102*  BUN 18  CREATININE 1.21*  CALCIUM 8.8*  GFRNONAA 43*  GFRAA 50*  ANIONGAP 8    No results for  input(s): PROT, ALBUMIN, AST, ALT, ALKPHOS, BILITOT in the last 168 hours. Hematology Recent Labs  Lab 01/24/20 2200  WBC 6.1  RBC 3.56*  HGB 10.0*  HCT 31.5*  MCV 88.5  MCH 28.1  MCHC 31.7  RDW 12.9  PLT 243   BNPNo results for input(s): BNP, PROBNP in the last 168 hours.  DDimer No results for input(s): DDIMER in the last 168 hours.   Radiology/Studies:  CT Angio Aortobifemoral W and/or Wo Contrast  Result Date: 01/25/2020 CLINICAL DATA:  Right lower leg pain and decreased pulses EXAM: CT ANGIOGRAPHY OF ABDOMINAL AORTA WITH ILIOFEMORAL RUNOFF TECHNIQUE: Multidetector CT imaging of the abdomen, pelvis and lower extremities was performed using the standard protocol during bolus administration of intravenous contrast. Multiplanar CT image reconstructions and MIPs were obtained to evaluate the vascular anatomy. CONTRAST:  60mL OMNIPAQUE IOHEXOL 350 MG/ML SOLN COMPARISON:  None. FINDINGS: VASCULAR Aorta: Abdominal aorta demonstrates mild atherosclerotic calcifications without aneurysmal dilatation. Celiac: Patent without evidence of aneurysm, dissection, vasculitis or significant stenosis. SMA: Patent without evidence of aneurysm, dissection, vasculitis or significant stenosis. Renals: Single renal arteries are identified bilaterally and widely patent. IMA: Patent without evidence of aneurysm, dissection, vasculitis or significant stenosis. RIGHT Lower Extremity Inflow: Common iliac, external iliac and internal iliac arteries are widely patent. No aneurysmal dilatation is seen. Runoff: Common femoral artery and femoral bifurcation are within normal limits. Profundus femoral artery is occluded shortly after its origin. The superficial femoral artery is within normal limits. Mild atherosclerotic calcifications are noted. The popliteal artery is patent proximally however occludes just beyond the knee joint. There is reconstitution of the anterior tibial artery just beyond its origin. The distal  anterior tibial artery is not well visualized due to timing of contrast bolus. The tibioperoneal trunk reconstitutes shortly after its origin with evidence of diffuse atherosclerotic calcifications. The peroneal artery is diminutive. The posterior tibial artery continues to the level of the foot. LEFT Lower Extremity Inflow: Common iliac artery, internal and external iliac arteries are patent. Runoff: Common femoral artery and femoral bifurcation are patent. Superficial femoral artery is widely patent with mild atherosclerotic calcifications. The popliteal artery is patent throughout its course with the exception of the distal most aspect where there is a filling defect identified suspicious for embolus. Anterior tibial artery and tibioperoneal trunk are visualized just beyond this filling defect and patent. The anterior tibial artery continues into the mid calf and is diminutive distally. Primary runoff is noted via the posterior tibial artery to the level of the ankle and subsequently into the foot. The peroneal artery is diminutive. Veins: No specific venous abnormality is noted. Review of the MIP images confirms the above findings. NON-VASCULAR Lower chest: Mild bibasilar atelectasis is noted right greater than left. Hepatobiliary: No focal liver abnormality is seen. Status post cholecystectomy. No biliary dilatation. Pancreas: Unremarkable. No pancreatic ductal dilatation or surrounding inflammatory changes. Spleen: Normal in size without focal abnormality. Adrenals/Urinary Tract: Adrenal glands are within normal limits  bilaterally. Kidneys demonstrate a normal enhancement pattern. Bladder is partially distended. Stomach/Bowel: Diverticular change of the colon is noted without evidence of diverticulitis. No obstructive or inflammatory change of the bowel is seen. The appendix is not visualized consistent with a prior surgical history. Lymphatic: No specific lymphadenopathy is noted. Reproductive: Uterus has  been surgically removed. No adnexal mass is noted. Other: No abdominal wall hernia or abnormality. No abdominopelvic ascites. Musculoskeletal: No acute or significant osseous findings. IMPRESSION: VASCULAR Atherosclerotic calcifications are noted throughout the arterial tree. Short segment occlusion of the right distal popliteal artery is noted with reconstitution of the anterior tibial artery and tibioperoneal trunk as described above. Dominant runoff is noted via the posterior tibial artery. Short segment occlusion versus high-grade stenosis of the left popliteal artery distally which appears to be related to a focal embolus. Reconstitution of the anterior tibial and tibioperoneal trunk is seen with dominant runoff via the posterior tibial artery. NON-VASCULAR Diverticular change without diverticulitis. Basilar atelectasis right greater than left. No acute abnormality noted. Electronically Signed   By: Alcide Clever M.D.   On: 01/25/2020 02:59        NO CHEST PAIN  Assessment and Plan:   1. Bilateral popliteal embolus acute and embolectomy. Per Dr. Arbie Cookey 2. Hx of SVT but no atrial fib, though this week she had rapid HR that may have been a fib.  At this time would need anticoagulation -- interestingly her POC covid is positive may be false positive, but if + would consider hypercoagulability from covid.  (she has not had COVIS vaccine)  Echo is ordered. Possible need of TEE if TTE neg but most likely will need anticoagulation - does have hx chronic anemia- and appears stable. (Hgb 10.8; 10.8; 11; 10.6; 11.1)  She is on IV heparin now.  Usually asked to see to manage anticoagulation as outpt.   3. COPD with continued tobacco use  4. Anemia hgb 03/2019 was 10.6   5. Recent tremors off balance to be referred to Neuro 6. POC COVID positive more accurate test neg.  ID- per nurse- instructed [place] to go by the more accurate test.      For questions or updates, please contact [facility] Please consult  [email or web address] for contact info under   Signed, Nada Boozer, NP  01/25/2020 10:59 AM   History and all data above reviewed.  Patient examined.  I agree with the findings as above. She has a history of palpitations.  However, she has no other significant cardiac history.   I did review there recent event monitor and there was no evidence of atrial fib. She had sudden onset leg pain as above.  Found to have probable embolic occlusion and is now status post bilateral popliteal embolectomy.  Echo is unremarkable as above.  She otherwise has felt well.  She lives with a companion who has end stage COPD.  Her daughter is there to help.  She does housework and yard work.  The patient denies any new symptoms such as chest discomfort, neck or arm discomfort. There has been no new shortness of breath, PND or orthopnea. There have been no reported palpitations, presyncope or syncope. She does smoke.   She   The patient exam reveals COR:RRR  ,  Lungs: Clear  ,  Abd: Positive bowel sounds, no rebound no guarding, Ext Decreased pulses.  Mild edema.   .  All available labs, radiology testing, previous records reviewed. Agree with documented assessment and plan.   Acute  popliteal artery embolism.  No clear source.  No evidence of atrial fib or source from the TTE.  No evidence of atherosclerotic obstructive PVD.  Currently on heparin.  She will need long term anticoagulation and I would suggest a DOAC. She has no contraindication to anticoagulation.  Her risk factor would be smoking and she is committed to stop. Doubt pardoxical embolism, no evidence of aneursymal dilatation.   It is reasonable to do a TEE which we might be able to do tomorrow.     Prolonged QT:    Avoid QT prolonging drugs.    Fayrene Fearing Caspar Favila  2:11 PM  01/25/2020

## 2020-01-25 NOTE — ED Provider Notes (Signed)
5:36 AM Assumed care from Dr. Anitra Lauth, please see their note for full history, physical and decision making until this point. In brief this is a 79 y.o. year old female who presented to the ED tonight with Leg Pain     Patient had been here for a few hours because of leg pain with concern for possible ischemia however the pain improved.  Patient got up and walked and the pain came back and started on paresthesias of her foot again.  Still has dopplerable pulses but decreased waveforms.  I discussed with Dr. Arbie Cookey he says he will admitted to the hospital but would prefer CTA first.  Wanted to be called soon as the CTA images were done.  Heparin started. Significant delay in getting a CT scan secondary to level traumas and strokes.  Once the CTAs were done it showed bilateral occlusions.  Dr. Arbie Cookey aware and called to get the patient to the operating room.  CRITICAL CARE Performed by: Marily Memos Total critical care time: 35 minutes Critical care time was exclusive of separately billable procedures and treating other patients. Critical care was necessary to treat or prevent imminent or life-threatening deterioration. Critical care was time spent personally by me on the following activities: development of treatment plan with patient and/or surrogate as well as nursing, discussions with consultants, evaluation of patient's response to treatment, examination of patient, obtaining history from patient or surrogate, ordering and performing treatments and interventions, ordering and review of laboratory studies, ordering and review of radiographic studies, pulse oximetry and re-evaluation of patient's condition.  Labs, studies and imaging reviewed by myself and considered in medical decision making if ordered. Imaging interpreted by radiology.  Labs Reviewed  CBC WITH DIFFERENTIAL/PLATELET - Abnormal; Notable for the following components:      Result Value   RBC 3.56 (*)    Hemoglobin 10.0 (*)    HCT  31.5 (*)    All other components within normal limits  BASIC METABOLIC PANEL - Abnormal; Notable for the following components:   Glucose, Bld 102 (*)    Creatinine, Ser 1.21 (*)    Calcium 8.8 (*)    GFR calc non Af Amer 43 (*)    GFR calc Af Amer 50 (*)    All other components within normal limits  POC SARS CORONAVIRUS 2 AG -  ED - Abnormal; Notable for the following components:   SARS Coronavirus 2 Ag POSITIVE (*)    All other components within normal limits  SARS CORONAVIRUS 2 BY RT PCR (HOSPITAL ORDER, PERFORMED IN [facility])  HEPARIN LEVEL (UNFRACTIONATED)  CBC  POC SARS CORONAVIRUS 2 AG -  ED    CT Angio Aortobifemoral W and/or Wo Contrast  Final Result      No follow-ups on file.    Conley Pawling, Barbara Cower, MD 01/25/20 [phone]

## 2020-01-25 NOTE — Progress Notes (Signed)
    [facility] has been requested to perform a transesophageal echocardiogram on Barbara Hill for thrombus to legs.  After careful review of history and examination, the risks and benefits of transesophageal echocardiogram have been explained including risks of esophageal damage, perforation (1:10,000 risk), bleeding, pharyngeal hematoma as well as other potential complications associated with conscious sedation including aspiration, arrhythmia, respiratory failure and death. Alternatives to treatment were discussed, questions were answered. Patient is willing to proceed.   I also discussed with her daughter [name].  They both are aware we may not be able to do TEE tomorrow it may be Monday.   Will know more in AM pt will be NPO after MN  Nada Boozer, NP  01/25/2020 5:59 PM

## 2020-01-25 NOTE — Progress Notes (Signed)
Patient is Covid negative per PCR test performed by [facility] microbiology department  [phone] on 01/25/2020.

## 2020-01-25 NOTE — Progress Notes (Signed)
ANTICOAGULATION CONSULT NOTE - Initial Consult  Pharmacy Consult for heparin Indication: arterial occlusion  Allergies  Allergen Reactions  . Dobutamine Other (See Comments)    Heart beating hard with CP, neck pain, and weakness  . Epinephrine Other (See Comments)    Fast heart beat  . Darvocet [Propoxyphene N-Acetaminophen] Nausea And Vomiting  . Excedrin Extra Strength [Aspirin-Acetaminophen-Caffeine] Nausea And Vomiting  . Aspirin   . Clarithromycin Other (See Comments)    Yeast infection  . Doxycycline Other (See Comments)    Makes stomach hurt  . Penicillins Other (See Comments)    Yeast infection  Has patient had a PCN reaction causing immediate rash, facial/tongue/throat swelling, SOB or lightheadedness with hypotension: No Has patient had a PCN reaction causing severe rash involving mucus membranes or skin necrosis: No Has patient had a PCN reaction that required hospitalization No Has patient had a PCN reaction occurring within the last 10 years: No If all of the above answers are "NO", then may proceed with Cephalosporin use.   . Prednisone Other (See Comments)    Shakes   . Propoxyphene Nausea And Vomiting    Patient Measurements: Height: 5\' 3"  (160 cm) Weight: 48.5 kg (107 lb) IBW/kg (Calculated) : 52.4  Vital Signs: Temp: 98.5 F (36.9 C) (06/02 2113) Temp Source: Oral (06/02 2113) BP: 150/132 (06/02 2315) Pulse Rate: 59 (06/02 2315)  Labs: Recent Labs    01/24/20 2200  HGB 10.0*  HCT 31.5*  PLT 243  CREATININE 1.21*    Estimated Creatinine Clearance: 29.8 mL/min (A) (by C-G formula based on SCr of 1.21 mg/dL (H)).   Medical History: Past Medical History:  Diagnosis Date  . Anxiety   . Brain aneurysm    pipeline stent 2017   . COPD (chronic obstructive pulmonary disease) (HCC)   . Fatigue   . H/O: hysterectomy   . Headache(784.0)   . History of cardiac catheterization    LHC 10/02: Normal coronary arteries  . History of nuclear stress  test    Myoview 10/12: Normal stress nuclear study.  . Hypertension   . Mitral valve prolapse    Echo 8/17:Mild LVH, EF 60-65, normal wall motion, grade 2 diastolic dysfunction, mild AI, mild MR, mild LAE // echo 11/01: EF 60%, mild anterior MVP with trivial MR, PASP 35-40 mmHg, LAE  . Orthostasis   . PSVT (paroxysmal supraventricular tachycardia) (HCC)    palps  . Radiculopathy    Assessment: CC/HPI: 79 yo f presenting with leg pain  PMH: HTN SVT COPD  Anticoag: none pta - iv hep for arterial occlusion   Renal: SCr 1.21   Heme/Onc: H&H 10/31.5, plt 243  Goal of Therapy:  Heparin level 0.3-0.7 units/ml Monitor platelets by anticoagulation protocol: Yes   Plan:  Heparin bolus 2000 units x 1 Heparin gtt 750 units/hr Initial HL 0800 Daily HL cbc F/u vascular plans  63, PharmD, BCPS, BCCCP Clinical Pharmacist [phone]  Please check AMION for all [facility] numbers  01/25/2020 12:37 AM

## 2020-01-25 NOTE — H&P (Signed)
History and Physical    Barbara Hill ZHY:[id number] DOB: Oct 12, 1940 DOA: 01/24/2020  Referring MD/NP/PA: Curt Jews, MD PCP: Aletha Halim., PA-C  Consultants cardiology- Dorris Carnes Patient coming from: Vascular lab  Chief Complaint: Right leg pain  I have personally briefly reviewed patient's old medical records in [place]   HPI: Barbara Hill is a 79 y.o. 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  History is obtained from the patient, but she seems intermittently confused and is not aware of the date and ask for her daughter who is not been by per nursing staff.  Her right leg pain started acutely and was severe.  A couple days prior she says that her heart was beating very fast for which EMS was called, but by their arrival was beating normally.  She had also had long-term Holter monitoring performed in March of this year which showed no significant arrhythmias.  She does admit to still smoking cigarettes, but reports that she will quit as she will related with the pain that she had in her right leg that brought her to the hospital.  ED Course: Upon admission into the emergency department patient was noted to be afebrile with a pressure elevated up to 217/84, heart rates intermittently fluctuating from 60-130, and all other vital signs maintained.  Labs are significant for hemoglobin 10, BUN 18, and creatinine 1.21.  CT scan of the abdomen pelvis revealed bilateral popliteal embolus.  Patient was taken immediately to the operating grading room by vascular surgery for bilateral popliteal embolectomy.  Review of Systems  Constitutional: Negative for fever and malaise/fatigue.  HENT: Negative for nosebleeds and sinus pain.   Eyes: Negative for photophobia and pain.    Respiratory: Positive for cough. Negative for shortness of breath.   Cardiovascular: Positive for palpitations. Negative for chest pain.  Gastrointestinal: Positive for nausea. Negative for abdominal pain, blood in stool and vomiting.  Genitourinary: Negative for dysuria and hematuria.  Musculoskeletal: Positive for myalgias. Negative for falls.  Skin: Negative for rash.  Neurological: Negative for focal weakness and loss of consciousness.  Psychiatric/Behavioral: Positive for memory loss and substance abuse.  All other systems reviewed and are negative.   Past Medical History:  Diagnosis Date  . Anxiety   . Brain aneurysm    pipeline stent 2017   . COPD (chronic obstructive pulmonary disease) ([facility])   . Fatigue   . H/O: hysterectomy   . Headache(784.0)   . History of cardiac catheterization    LHC 10/02: Normal coronary arteries  . History of nuclear stress test    Myoview 10/12: Normal stress nuclear study.  . Hypertension   . Mitral valve prolapse    Echo 8/17:Mild LVH, EF 60-65, normal wall motion, grade 2 diastolic dysfunction, mild AI, mild MR, mild LAE // echo 11/01: EF 60%, mild anterior MVP with trivial MR, PASP 35-40 mmHg, LAE  . Orthostasis   . PSVT (paroxysmal supraventricular tachycardia) (HCC)    palps  . Radiculopathy     Past Surgical History:  Procedure Laterality Date  . ABDOMINAL HYSTERECTOMY    . APPENDECTOMY    . CARDIAC CATHETERIZATION  12/95  . CATARACT EXTRACTION    . CHOLECYSTECTOMY    . IR GENERIC HISTORICAL  06/02/2016   IR RADIOLOGIST EVAL & MGMT 06/02/2016 [facility]  . IR GENERIC HISTORICAL  06/29/2016   IR ANGIO INTRA EXTRACRAN SEL INTERNAL CAROTID UNI R MOD SED 06/29/2016 [name], MD [facility]  . IR GENERIC HISTORICAL  06/29/2016   IR ANGIOGRAM FOLLOW UP STUDY 06/29/2016 [name], MD [facility]  . IR GENERIC HISTORICAL  06/29/2016   IR NEURO EACH ADD'L AFTER BASIC UNI RIGHT (MS) 06/29/2016 [name], MD  [facility]  . IR GENERIC HISTORICAL  06/29/2016   IR 3D INDEPENDENT WKST 06/29/2016 [name], MD [facility]  . IR GENERIC HISTORICAL  06/29/2016   IR TRANSCATH/EMBOLIZ 06/29/2016 [name], MD [facility]  . IR GENERIC HISTORICAL  06/29/2016   IR ANGIO VERTEBRAL SEL SUBCLAVIAN INNOMINATE UNI R MOD SED 06/29/2016 [name], MD [facility]  . IR GENERIC HISTORICAL  07/28/2016   IR RADIOLOGIST EVAL & MGMT 07/28/2016 [facility]  . IR GENERIC HISTORICAL  11/12/2016   IR ANGIO INTRA EXTRACRAN SEL COM CAROTID INNOMINATE BILAT MOD SED 11/12/2016 [name], MD [facility]  . IR GENERIC HISTORICAL  11/12/2016   IR ANGIO VERTEBRAL SEL VERTEBRAL UNI L MOD SED 11/12/2016 [name], MD [facility]  . IR GENERIC HISTORICAL  11/12/2016   IR ANGIO VERTEBRAL SEL SUBCLAVIAN INNOMINATE UNI R MOD SED 11/12/2016 [name], MD [facility]  . NASAL SEPTUM SURGERY    . RADIOLOGY WITH ANESTHESIA N/A 06/29/2016   Procedure: EMBOLIZATION;  Surgeon: [name], MD;  Location: [facility];  Service: Radiology;  Laterality: N/A;  . ROTATOR CUFF REPAIR       reports that she has been smoking cigarettes. She has a 50.00 pack-year smoking history. She uses smokeless tobacco. She reports that she does not drink alcohol or use drugs.  Allergies  Allergen Reactions  . Dobutamine Other (See Comments)    Heart beating hard with CP, neck pain, and weakness  . Epinephrine Other (See Comments)    Fast heart beat  . Darvocet [Propoxyphene N-Acetaminophen] Nausea And Vomiting  . Excedrin Extra Strength [Aspirin-Acetaminophen-Caffeine] Nausea And Vomiting  . Aspirin   . Clarithromycin Other (See Comments)    Yeast infection  . Doxycycline Other (See Comments)    Makes stomach hurt  . Penicillins Other (See Comments)    Yeast infection  Has patient had a PCN reaction causing immediate rash, facial/tongue/throat swelling, SOB or lightheadedness with hypotension: No Has  patient had a PCN reaction causing severe rash involving mucus membranes or skin necrosis: No Has patient had a PCN reaction that required hospitalization No Has patient had a PCN reaction occurring within the last 10 years: No If all of the above answers are "NO", then may proceed with Cephalosporin use.   . Prednisone Other (See Comments)    Shakes   . Propoxyphene Nausea And Vomiting    Family History  Problem Relation Age of Onset  . Heart attack Mother   . Transient ischemic attack Mother   . Heart attack Father 90  . Heart attack Other   . Asthma Other   . Heart failure Other   . Osteoporosis Other   . Heart Problems Brother   . CAD Other     Prior to Admission medications   Medication Sig Start Date End Date Taking? Authorizing Provider  acetaminophen (TYLENOL) 650 MG CR tablet Take 1,300 mg by mouth every 8 (eight) hours as needed for pain.   Yes [provider]  ALPRAZolam [name]) 0.5 MG tablet Take 0.5 mg by mouth 3 (three) times daily.  Yes [provider]  butalbital-acetaminophen-caffeine (FIORICET, ESGIC) 50-325-40 MG per tablet Take 1-2 tablets by mouth every 4 (four) hours as needed for headache or migraine.  07/06/14  Yes [provider]  clopidogrel (PLAVIX) 75 MG tablet Take 75 mg by mouth daily. 06/18/16  Yes [provider]  flecainide (TAMBOCOR) 50 MG tablet Take 1 tablet (50 mg total) by mouth 2 (two) times daily. 10/12/19  Yes Pricilla Riffle, MD  FLUoxetine (PROZAC) 20 MG capsule Take 20 mg by mouth 2 (two) times daily. 06/07/16  Yes [provider]  fluticasone (FLONASE) 50 MCG/ACT nasal spray Place 1 spray into both nostrils daily as needed for allergies or rhinitis.  10/11/14  Yes [provider]  losartan (COZAAR) 25 MG tablet TAKE 1 TABLET BY MOUTH EVERY DAY Patient taking differently: Take 25 mg by mouth daily.  11/28/18  Yes Weaver, Scott T, PA-C  metoprolol succinate (TOPROL-XL) 25 MG 24 hr tablet  TAKE 1 TABLET BY MOUTH EVERYDAY AT BEDTIME Patient taking differently: Take 25 mg by mouth at bedtime.  10/02/19  Yes Pricilla Riffle, MD  omeprazole (PRILOSEC) 40 MG capsule Take 40 mg by mouth daily.     Yes [provider]  Probiotic Product (PROBIOTIC PO) Take liquid probiotic by mouth twice daily (Activia)    Yes [provider]  cholecalciferol (VITAMIN D) 1000 units tablet Take 1,000 Units by mouth daily.    [provider]    Physical Exam:  Constitutional: Elderly female who appears to be in no acute distress at this time Vitals:   01/25/20 0315 01/25/20 0751 01/25/20 0806 01/25/20 0821  BP: (!) 188/78 (!) 178/91 (!) 184/75 (!) 186/71  Pulse: (!) 53 71 67 66  Resp: 15 20 20 16   Temp:  97.6 F (36.4 C)    TempSrc:      SpO2: 95% 100% 100% 100%  Weight:      Height:       Eyes: PERRL, lids and conjunctivae normal ENMT: Mucous membranes are moist. Posterior pharynx clear of any exudate or lesions.  Neck: normal, supple, no masses, no thyromegaly Respiratory: clear to auscultation bilaterally, no wheezing, no crackles. Normal respiratory effort. No accessory muscle use.  Cardiovascular: Regular rate and rhythm, no murmurs / rubs / gallops. No extremity edema. 2+ pedal pulses. No carotid bruits.  Abdomen: no tenderness, no masses palpated. No hepatosplenomegaly. Bowel sounds positive.  Musculoskeletal: no clubbing / cyanosis. No joint deformity upper and lower extremities. Good ROM, no contractures. Normal muscle tone.  Skin: no rashes, lesions, ulcers. No induration Neurologic: CN 2-12 grossly intact. Sensation intact, DTR normal. Strength 5/5 in all 4.  Psychiatric: Normal judgment and insight. Alert and oriented x 3. Normal mood.     Labs on Admission: I have personally reviewed following labs and imaging studies  CBC: Recent Labs  Lab 01/24/20 2200  WBC 6.1  NEUTROABS 2.5  HGB 10.0*  HCT 31.5*  MCV 88.5  PLT 243   Basic Metabolic  Panel: Recent Labs  Lab 01/24/20 2200  NA 140  K 3.9  CL 104  CO2 28  GLUCOSE 102*  BUN 18  CREATININE 1.21*  CALCIUM 8.8*   GFR: Estimated Creatinine Clearance: 29.8 mL/min (A) (by C-G formula based on SCr of 1.21 mg/dL (H)). Liver Function Tests: No results for input(s): AST, ALT, ALKPHOS, BILITOT, PROT, ALBUMIN in the last 168 hours. No results for input(s): LIPASE, AMYLASE in the last 168 hours. No results for input(s):  AMMONIA in the last 168 hours. Coagulation Profile: No results for input(s): INR, PROTIME in the last 168 hours. Cardiac Enzymes: No results for input(s): CKTOTAL, CKMB, CKMBINDEX, TROPONINI in the last 168 hours. BNP (last 3 results) No results for input(s): PROBNP in the last 8760 hours. HbA1C: No results for input(s): HGBA1C in the last 72 hours. CBG: No results for input(s): GLUCAP in the last 168 hours. Lipid Profile: No results for input(s): CHOL, HDL, LDLCALC, TRIG, CHOLHDL, LDLDIRECT in the last 72 hours. Thyroid Function Tests: No results for input(s): TSH, T4TOTAL, FREET4, T3FREE, THYROIDAB in the last 72 hours. Anemia Panel: No results for input(s): VITAMINB12, FOLATE, FERRITIN, TIBC, IRON, RETICCTPCT in the last 72 hours. Urine analysis:    Component Value Date/Time   COLORURINE YELLOW 06/12/2018 1942   APPEARANCEUR CLEAR 06/12/2018 1942   APPEARANCEUR Clear 09/08/2012 0030   LABSPEC 1.010 06/12/2018 1942   LABSPEC 1.013 09/08/2012 0030   PHURINE 6.5 06/12/2018 1942   GLUCOSEU NEGATIVE 06/12/2018 1942   GLUCOSEU Negative 09/08/2012 0030   HGBUR SMALL (A) 06/12/2018 1942   BILIRUBINUR NEGATIVE 06/12/2018 1942   BILIRUBINUR Negative 09/08/2012 0030   KETONESUR NEGATIVE 06/12/2018 1942   PROTEINUR NEGATIVE 06/12/2018 1942   UROBILINOGEN 0.2 01/18/2015 2006   NITRITE NEGATIVE 06/12/2018 1942   LEUKOCYTESUR NEGATIVE 06/12/2018 1942   LEUKOCYTESUR Negative 09/08/2012 0030   Sepsis Labs: Recent Results (from the past 240 hour(s))   SARS Coronavirus 2 by RT PCR (hospital order, performed in [facility] hospital lab) Nasopharyngeal Nasopharyngeal Swab     Status: None   Collection Time: 01/25/20  4:23 AM   Specimen: Nasopharyngeal Swab  Result Value Ref Range Status   SARS Coronavirus 2 NEGATIVE NEGATIVE Final    Comment: (NOTE) SARS-CoV-2 target nucleic acids are NOT DETECTED. The SARS-CoV-2 RNA is generally detectable in upper and lower respiratory specimens during the acute phase of infection. The lowest concentration of SARS-CoV-2 viral copies this assay can detect is 250 copies / mL. A negative result does not preclude SARS-CoV-2 infection and should not be used as the sole basis for treatment or other patient management decisions.  A negative result may occur with improper specimen collection / handling, submission of specimen other than nasopharyngeal swab, presence of viral mutation(s) within the areas targeted by this assay, and inadequate number of viral copies (<250 copies / mL). A negative result must be combined with clinical observations, patient history, and epidemiological information. Fact Sheet for Patients:   BoilerBrush.com.cy Fact Sheet for Healthcare Providers: https://pope.com/ This test is not yet approved or cleared  by the [place] FDA and has been authorized for detection and/or diagnosis of SARS-CoV-2 by FDA under an Emergency Use Authorization (EUA).  This EUA will remain in effect (meaning this test can be used) for the duration of the COVID-19 declaration under Section 564(b)(1) of the Act, 21 U.S.C. section 360bbb-3(b)(1), unless the authorization is terminated or revoked sooner. Performed at [facility], [address] [place], [place] [address]      Radiological Exams on Admission: CT Angio Aortobifemoral W and/or Wo Contrast  Result Date: 01/25/2020 CLINICAL DATA:  Right lower leg pain and decreased pulses EXAM: CT  ANGIOGRAPHY OF ABDOMINAL AORTA WITH ILIOFEMORAL RUNOFF TECHNIQUE: Multidetector CT imaging of the abdomen, pelvis and lower extremities was performed using the standard protocol during bolus administration of intravenous contrast. Multiplanar CT image reconstructions and MIPs were obtained to evaluate the vascular anatomy. CONTRAST:  80mL OMNIPAQUE IOHEXOL 350 MG/ML SOLN COMPARISON:  None. FINDINGS: VASCULAR Aorta: Abdominal aorta demonstrates mild atherosclerotic calcifications without aneurysmal dilatation. Celiac: Patent without evidence of aneurysm, dissection, vasculitis or significant stenosis. SMA: Patent without evidence of aneurysm, dissection, vasculitis or significant stenosis. Renals: Single renal arteries are identified bilaterally and widely patent. IMA: Patent without evidence of aneurysm, dissection, vasculitis or significant stenosis. RIGHT Lower Extremity Inflow: Common iliac, external iliac and internal iliac arteries are widely patent. No aneurysmal dilatation is seen. Runoff: Common femoral artery and femoral bifurcation are within normal limits. Profundus femoral artery is occluded shortly after its origin. The superficial femoral artery is within normal limits. Mild atherosclerotic calcifications are noted. The popliteal artery is patent proximally however occludes just beyond the knee joint. There is reconstitution of the anterior tibial artery just beyond its origin. The distal anterior tibial artery is not well visualized due to timing of contrast bolus. The tibioperoneal trunk reconstitutes shortly after its origin with evidence of diffuse atherosclerotic calcifications. The peroneal artery is diminutive. The posterior tibial artery continues to the level of the foot. LEFT Lower Extremity Inflow: Common iliac artery, internal and external iliac arteries are patent. Runoff: Common femoral artery and femoral bifurcation are patent. Superficial femoral artery is widely patent with mild  atherosclerotic calcifications. The popliteal artery is patent throughout its course with the exception of the distal most aspect where there is a filling defect identified suspicious for embolus. Anterior tibial artery and tibioperoneal trunk are visualized just beyond this filling defect and patent. The anterior tibial artery continues into the mid calf and is diminutive distally. Primary runoff is noted via the posterior tibial artery to the level of the ankle and subsequently into the foot. The peroneal artery is diminutive. Veins: No specific venous abnormality is noted. Review of the MIP images confirms the above findings. NON-VASCULAR Lower chest: Mild bibasilar atelectasis is noted right greater than left. Hepatobiliary: No focal liver abnormality is seen. Status post cholecystectomy. No biliary dilatation. Pancreas: Unremarkable. No pancreatic ductal dilatation or surrounding inflammatory changes. Spleen: Normal in size without focal abnormality. Adrenals/Urinary Tract: Adrenal glands are within normal limits bilaterally. Kidneys demonstrate a normal enhancement pattern. Bladder is partially distended. Stomach/Bowel: Diverticular change of the colon is noted without evidence of diverticulitis. No obstructive or inflammatory change of the bowel is seen. The appendix is not visualized consistent with a prior surgical history. Lymphatic: No specific lymphadenopathy is noted. Reproductive: Uterus has been surgically removed. No adnexal mass is noted. Other: No abdominal wall hernia or abnormality. No abdominopelvic ascites. Musculoskeletal: No acute or significant osseous findings. IMPRESSION: VASCULAR Atherosclerotic calcifications are noted throughout the arterial tree. Short segment occlusion of the right distal popliteal artery is noted with reconstitution of the anterior tibial artery and tibioperoneal trunk as described above. Dominant runoff is noted via the posterior tibial artery. Short segment  occlusion versus high-grade stenosis of the left popliteal artery distally which appears to be related to a focal embolus. Reconstitution of the anterior tibial and tibioperoneal trunk is seen with dominant runoff via the posterior tibial artery. NON-VASCULAR Diverticular change without diverticulitis. Basilar atelectasis right greater than left. No acute abnormality noted. Electronically Signed   By: Alcide Clever M.D.   On: 01/25/2020 02:59    EKG: Independently reviewed.  Sinus rhythm at 63 bpm with QTc 491  Assessment/Plan Bilateral ischemic legs: Acute.  Patient presented with bilateral popliteal embolus.  Patient underwent bilateral popliteal exploration and embolectomy by vascular surgery.  Suspecting cardioembolic in nature, but no prior history of atrial fibrillation.  Given this event suspecting patient likely need to be on anticoagulation.  No prior history of significant bleeding events reported. -Admit to a progressive bed -Heparin per pharmacy -Consider switching over to oral anticoagulation if no signs of bleeding in a.m. -Check echocardiogram -PT consult for balance -Appreciate vascular surgery and cardiology consultative services,  will follow-up for any further recommendations  Paroxysmal SVT: On admission patient appeared to be in sinus rhythm.  Home medications include flecainide 50mg  twice daily -Continue flecainide  COPD/tobacco use: Patient without signs of acute exacerbation of her COPD.  Reports that she will quit using tobacco as she associates it with the pain that brought her to the hospital.  Declined nicotine patch. -Albuterol nebs as needed shortness of breath/wheezing -Continue to counsel on need of cessation of tobacco use  Memory deficit: Patient noted issues with memory and possibly confused at times.  Unclear if this is related with previous stroke or new. -Neurochecks -Check urinalysis  Normocytic anemia: Hemoglobin 10.0 g/dl which appears near patient's  baseline. -Continue to monitor on anticoagulation  History of CVA: Patient with previous history of stroke following procedure in 2017.  Essential hypertension: Home medications include losartan 25 mg daily and metoprolol 25 mg nightly -Continue current blood pressure medications as tolerated  Chronic kidney disease stage IIIb: Patient presents with creatinine of 1.21 which appears within her baseline of 1.2-1.3. -Continue to monitor  Anxiety: Continue Xanax 0.5 mg 3 times daily as needed and fluoxetine 20 mg twice daily -Continue Xanax and fluoxetine  GERD: Home meds include omeprazole 20 mg daily -Continue pharmacy substitution   DVT prophylaxis: Heparin Code Status: Full  Family Communication: 3 attempts were made to call the patient's daughter and mailbox was full so unable to leave message. Disposition Plan: To be determined Consults called: Vascular surgery Admission status: Inpatient  Clydie Braunondell A Kamille Toomey MD [facility] Pager [phone]   If 7PM-7AM, please contact night-coverage [email or web address] Password [email or web address]  01/25/2020, 8:30 AM

## 2020-01-25 NOTE — Transfer of Care (Signed)
Immediate Anesthesia Transfer of Care Note  Patient: Barbara Hill  Procedure(s) Performed: Bilateral popliteal EMBOLECTOMY. (Bilateral Leg Lower)  Patient Location: PACU  Anesthesia Type:General  Level of Consciousness: drowsy, patient cooperative and responds to stimulation  Airway & Oxygen Therapy: Patient Spontanous Breathing and Patient connected to nasal cannula oxygen  Post-op Assessment: Report given to RN and Post -op Vital signs reviewed and stable  Post vital signs: Reviewed and stable  Last Vitals:  Vitals Value Taken Time  BP 178/91 01/25/20 0751  Temp    Pulse 71 01/25/20 0754  Resp 15 01/25/20 0753  SpO2 100 % 01/25/20 0754  Vitals shown include unvalidated device data.  Last Pain:  Vitals:   01/24/20 2114  TempSrc:   PainSc: 0-No pain         Complications: No apparent anesthesia complications

## 2020-01-25 NOTE — Anesthesia Procedure Notes (Signed)
Procedure Name: Intubation Date/Time: 01/25/2020 6:05 AM Performed by: Molli Hazard, CRNA Pre-anesthesia Checklist: Patient identified, Emergency Drugs available, Suction available and Patient being monitored Patient Re-evaluated:Patient Re-evaluated prior to induction Oxygen Delivery Method: Circle system utilized Preoxygenation: Pre-oxygenation with 100% oxygen Induction Type: IV induction Ventilation: Mask ventilation without difficulty Laryngoscope Size: Miller and 2 Grade View: Grade I Tube type: Oral Tube size: 7.5 mm Number of attempts: 1 Airway Equipment and Method: Stylet Placement Confirmation: ETT inserted through vocal cords under direct vision,  positive ETCO2 and breath sounds checked- equal and bilateral Secured at: 22 cm Tube secured with: Tape Dental Injury: Teeth and Oropharynx as per pre-operative assessment

## 2020-01-25 NOTE — Op Note (Signed)
    OPERATIVE REPORT  DATE OF SURGERY: 01/25/2020  PATIENT: Barbara Hill, 79 y.o. female MRN: [id number]  DOB: 1940-09-27  PRE-OPERATIVE DIAGNOSIS: Bilateral popliteal embolus  POST-OPERATIVE DIAGNOSIS:  Same  PROCEDURE: Bilateral popliteal exploration and embolectomy  SURGEON:  Gretta Began, M.D.  PHYSICIAN ASSISTANT: Matt Eveland, PA-C  ANESTHESIA: General  EBL: per anesthesia record  Total I/O In: 1048.1 [I.V.:1048.1] Out: 85 [Urine:45; Blood:40]  BLOOD ADMINISTERED: none  DRAINS: none  SPECIMEN: none  COUNTS CORRECT:  YES  PATIENT DISPOSITION:  PACU - hemodynamically stable  PROCEDURE DETAILS: Patient was taken operating placed supine position where the area of both groins both legs were prepped and draped in usual sterile fashion.  An incision was made over the medial approach in the below-knee popliteal position on the right and carried down to the level of the popliteal vessels.  Blue vessel loops were used to encircled the popliteal artery.  There was a pulse proximally with no pulse distally.  The patient had been on a heparin drip and was given an additional 3000 units of intravenous heparin.  The popliteal artery was occluded proximally and was opened with a transverse incision with an 11 blade.  There was thrombus present.  This was removed with DeBakey's.  A 4 Fogarty catheter was passed to the level of the distal calf and was removed.  There was clot at the popliteal artery and excellent backbleeding.  No further thrombus was removed.  The distal popliteal artery was reoccluded.  Catheter was then passed proximally and there was some thrombus at the popliteal artery but no clot proximally and there was excellent inflow.  The artery was reoccluded and the arteriotomy was closed with interrupted 6-0 Prolene sutures.  Clamps removed and good Doppler flow was noted at the foot.  Next attention was turned to the left leg.  A similar incision was made over the medial  approach to the below-knee popliteal artery.  The artery again was encircled with a blue vessel loop for proximal control and was opened transversely with 11 blade.  Fogarty catheter was passed through the level of the popliteal artery and the thrombus was removed.  This thrombus was sent to pathology as was the right popliteal embolus.  Excellent backbleeding was encountered and the popliteal artery was reoccluded.  A Fogarty was passed proximally and there was no thrombus proximal.  The arteriotomy was closed with interrupted 6-0 Prolene sutures.  Clamp was removed and good Doppler flow was noted to the foot.  The patient was not given protamine to reverse the heparin.  The wounds were irrigated with saline and hemostasis was obtained with electrocautery.  Wounds were closed with 2-0 Vicryl in the fascia and the skin was closed with 3-0 subcuticular Vicryl stitch.  Sterile dressing was applied and the patient was transferred to the recovery room in stable condition   [name], M.D., [facility] 01/25/2020 8:13 AM

## 2020-01-25 NOTE — Plan of Care (Signed)
  Problem: Education: Goal: Knowledge of General Education information will improve Description Including pain rating scale, medication(s)/side effects and non-pharmacologic comfort measures Outcome: Progressing   

## 2020-01-25 NOTE — Progress Notes (Signed)
Patient to room 4E27 from PACU. Vital signs obtained. On monitor CCMD notified. CHG bath completed. Alert and oriented to room and call light. Call bell within reach.  Ginette Otto

## 2020-01-26 ENCOUNTER — Encounter (HOSPITAL_COMMUNITY)
Admission: EM | Disposition: A | Discharge status: Skilled Nursing Facility | Payer: Self-pay | Source: Home / Self Care | Attending: Internal Medicine

## 2020-01-26 ENCOUNTER — Inpatient Hospital Stay (HOSPITAL_COMMUNITY): Payer: Medicare Other | Admitting: Certified Registered Nurse Anesthetist

## 2020-01-26 DIAGNOSIS — I998 Other disorder of circulatory system: Secondary | ICD-10-CM

## 2020-01-26 HISTORY — PX: FASCIOTOMY: SHX132

## 2020-01-26 LAB — BASIC METABOLIC PANEL
Anion gap: 9 (ref 5–15)
BUN: 14 mg/dL (ref 8–23)
CO2: 28 mmol/L (ref 22–32)
Calcium: 8.5 mg/dL — ABNORMAL LOW (ref 8.9–10.3)
Chloride: 100 mmol/L (ref 98–111)
Creatinine, Ser: 1.15 mg/dL — ABNORMAL HIGH (ref 0.44–1.00)
GFR calc Af Amer: 53 mL/min — ABNORMAL LOW (ref >60)
GFR calc non Af Amer: 46 mL/min — ABNORMAL LOW (ref >60)
Glucose, Bld: 133 mg/dL — ABNORMAL HIGH (ref 70–99)
Potassium: 3.4 mmol/L — ABNORMAL LOW (ref 3.5–5.1)
Sodium: 137 mmol/L (ref 135–145)

## 2020-01-26 LAB — CBC
HCT: 25.5 % — ABNORMAL LOW (ref 36.0–46.0)
Hemoglobin: 8.1 g/dL — ABNORMAL LOW (ref 12.0–15.0)
MCH: 27.8 pg (ref 26.0–34.0)
MCHC: 31.8 g/dL (ref 30.0–36.0)
MCV: 87.6 fL (ref 80.0–100.0)
Platelets: 232 10*3/uL (ref 150–400)
RBC: 2.91 MIL/uL — ABNORMAL LOW (ref 3.87–5.11)
RDW: 13.1 % (ref 11.5–15.5)
WBC: 10.1 10*3/uL (ref 4.0–10.5)
nRBC: 0 % (ref 0.0–0.2)

## 2020-01-26 LAB — SURGICAL PATHOLOGY

## 2020-01-26 LAB — HEPARIN LEVEL (UNFRACTIONATED): Heparin Unfractionated: 0.64 IU/mL (ref 0.30–0.70)

## 2020-01-26 SURGERY — FASCIOTOMY, UPPER EXTREMITY
Anesthesia: General | Site: Leg Lower | Laterality: Bilateral

## 2020-01-26 MED ORDER — EPHEDRINE 5 MG/ML INJ
INTRAVENOUS | Status: AC
  Filled 2020-01-26: qty 10

## 2020-01-26 MED ORDER — PHENYLEPHRINE 40 MCG/ML (10ML) SYRINGE FOR IV PUSH (FOR BLOOD PRESSURE SUPPORT)
PREFILLED_SYRINGE | INTRAVENOUS | Status: DC | PRN
  Administered 2020-01-26 (×2): 40 ug via INTRAVENOUS

## 2020-01-26 MED ORDER — ALBUTEROL SULFATE HFA 108 (90 BASE) MCG/ACT IN AERS
INHALATION_SPRAY | RESPIRATORY_TRACT | Status: AC
  Filled 2020-01-26: qty 6.7

## 2020-01-26 MED ORDER — SUCCINYLCHOLINE CHLORIDE 20 MG/ML IJ SOLN
INTRAMUSCULAR | Status: DC | PRN
  Administered 2020-01-26: 100 mg via INTRAVENOUS

## 2020-01-26 MED ORDER — LACTATED RINGERS IV SOLN: INTRAVENOUS | Status: DC

## 2020-01-26 MED ORDER — ONDANSETRON HCL 4 MG/2ML IJ SOLN
INTRAMUSCULAR | Status: DC | PRN
  Administered 2020-01-26: 4 mg via INTRAVENOUS

## 2020-01-26 MED ORDER — HEPARIN (PORCINE) 25000 UT/250ML-% IV SOLN
1000.0000 [IU]/h | INTRAVENOUS | Status: AC
  Administered 2020-01-27: 750 [IU]/h via INTRAVENOUS
  Administered 2020-01-28: 950 [IU]/h via INTRAVENOUS
  Administered 2020-01-29: 1050 [IU]/h via INTRAVENOUS
  Administered 2020-01-30: 900 [IU]/h via INTRAVENOUS
  Administered 2020-01-31: 950 [IU]/h via INTRAVENOUS
  Administered 2020-02-02 – 2020-02-04 (×3): 1000 [IU]/h via INTRAVENOUS
  Filled 2020-01-26 (×8): qty 250

## 2020-01-26 MED ORDER — LIDOCAINE 2% (20 MG/ML) 5 ML SYRINGE
INTRAMUSCULAR | Status: DC | PRN
  Administered 2020-01-26: 80 mg via INTRAVENOUS

## 2020-01-26 MED ORDER — FENTANYL CITRATE (PF) 250 MCG/5ML IJ SOLN
INTRAMUSCULAR | Status: DC | PRN
  Administered 2020-01-26 (×2): 25 ug via INTRAVENOUS
  Administered 2020-01-26: 100 ug via INTRAVENOUS

## 2020-01-26 MED ORDER — EPHEDRINE SULFATE-NACL 50-0.9 MG/10ML-% IV SOSY
PREFILLED_SYRINGE | INTRAVENOUS | Status: DC | PRN
  Administered 2020-01-26 (×2): 5 mg via INTRAVENOUS
  Administered 2020-01-26: 10 mg via INTRAVENOUS

## 2020-01-26 MED ORDER — ALBUTEROL SULFATE HFA 108 (90 BASE) MCG/ACT IN AERS
INHALATION_SPRAY | RESPIRATORY_TRACT | Status: DC | PRN
  Administered 2020-01-26: 2 via RESPIRATORY_TRACT

## 2020-01-26 MED ORDER — POTASSIUM CHLORIDE CRYS ER 20 MEQ PO TBCR
40.0000 meq | EXTENDED_RELEASE_TABLET | Freq: Once | ORAL | Status: AC
  Administered 2020-01-26: 40 meq via ORAL
  Filled 2020-01-26: qty 2

## 2020-01-26 MED ORDER — CEFAZOLIN SODIUM-DEXTROSE 2-3 GM-%(50ML) IV SOLR
INTRAVENOUS | Status: DC | PRN
  Administered 2020-01-26: 2 g via INTRAVENOUS

## 2020-01-26 MED ORDER — FENTANYL CITRATE (PF) 250 MCG/5ML IJ SOLN
INTRAMUSCULAR | Status: AC
  Filled 2020-01-26: qty 5

## 2020-01-26 MED ORDER — CEFAZOLIN SODIUM-DEXTROSE 2-4 GM/100ML-% IV SOLN
INTRAVENOUS | Status: AC
  Filled 2020-01-26: qty 100

## 2020-01-26 MED ORDER — DEXAMETHASONE SODIUM PHOSPHATE 10 MG/ML IJ SOLN
INTRAMUSCULAR | Status: DC | PRN
  Administered 2020-01-26: 4 mg via INTRAVENOUS

## 2020-01-26 MED ORDER — PROPOFOL 10 MG/ML IV BOLUS
INTRAVENOUS | Status: DC | PRN
  Administered 2020-01-26: 100 mg via INTRAVENOUS

## 2020-01-26 MED ORDER — 0.9 % SODIUM CHLORIDE (POUR BTL) OPTIME
TOPICAL | Status: DC | PRN
  Administered 2020-01-26: 1000 mL

## 2020-01-26 MED ORDER — CLOPIDOGREL BISULFATE 75 MG PO TABS
75.0000 mg | ORAL_TABLET | Freq: Every day | ORAL | Status: DC
  Administered 2020-01-27 – 2020-02-04 (×9): 75 mg via ORAL
  Filled 2020-01-26 (×9): qty 1

## 2020-01-26 SURGICAL SUPPLY — 31 items
BNDG CMPR MED 10X6 ELC LF (GAUZE/BANDAGES/DRESSINGS) ×2
BNDG ELASTIC 4X5.8 VLCR STR LF (GAUZE/BANDAGES/DRESSINGS) IMPLANT
BNDG ELASTIC 6X10 VLCR STRL LF (GAUZE/BANDAGES/DRESSINGS) ×4 IMPLANT
BNDG ELASTIC 6X5.8 VLCR STR LF (GAUZE/BANDAGES/DRESSINGS) IMPLANT
BNDG GAUZE ELAST 4 BULKY (GAUZE/BANDAGES/DRESSINGS) ×4 IMPLANT
CANISTER SUCT 3000ML PPV (MISCELLANEOUS) ×3 IMPLANT
CLIP LIGATING EXTRA MED SLVR (CLIP) ×3 IMPLANT
CLIP LIGATING EXTRA SM BLUE (MISCELLANEOUS) ×3 IMPLANT
COVER WAND RF STERILE (DRAPES) ×1 IMPLANT
DRAPE U-SHAPE 47X51 STRL (DRAPES) ×2 IMPLANT
DRAPE U-SHAPE 76X120 STRL (DRAPES) ×2 IMPLANT
ELECT REM PT RETURN 9FT ADLT (ELECTROSURGICAL) ×3
ELECTRODE REM PT RTRN 9FT ADLT (ELECTROSURGICAL) ×1 IMPLANT
GAUZE SPONGE 4X4 12PLY STRL (GAUZE/BANDAGES/DRESSINGS) ×3 IMPLANT
GLOVE SS BIOGEL STRL SZ 7.5 (GLOVE) ×1 IMPLANT
GLOVE SUPERSENSE BIOGEL SZ 7.5 (GLOVE) ×2
GOWN STRL REUS W/ TWL LRG LVL3 (GOWN DISPOSABLE) ×3 IMPLANT
GOWN STRL REUS W/TWL LRG LVL3 (GOWN DISPOSABLE) ×9
KIT BASIN OR (CUSTOM PROCEDURE TRAY) ×3 IMPLANT
KIT TURNOVER KIT B (KITS) ×3 IMPLANT
NS IRRIG 1000ML POUR BTL (IV SOLUTION) ×3 IMPLANT
PACK GENERAL/GYN (CUSTOM PROCEDURE TRAY) ×3 IMPLANT
PACK UNIVERSAL I (CUSTOM PROCEDURE TRAY) ×3 IMPLANT
PAD ARMBOARD 7.5X6 YLW CONV (MISCELLANEOUS) ×6 IMPLANT
STAPLER VISISTAT 35W (STAPLE) ×2 IMPLANT
SUT ETHILON 3 0 PS 1 (SUTURE) IMPLANT
SUT VIC AB 2-0 CTX 36 (SUTURE) IMPLANT
SUT VIC AB 3-0 SH 27 (SUTURE)
SUT VIC AB 3-0 SH 27X BRD (SUTURE) IMPLANT
TOWEL GREEN STERILE (TOWEL DISPOSABLE) ×3 IMPLANT
WATER STERILE IRR 1000ML POUR (IV SOLUTION) ×3 IMPLANT

## 2020-01-26 NOTE — Anesthesia Procedure Notes (Signed)
Procedure Name: Intubation Date/Time: 01/26/2020 9:29 AM Performed by: Janene Harvey, CRNA Pre-anesthesia Checklist: Patient identified, Emergency Drugs available, Suction available and Patient being monitored Patient Re-evaluated:Patient Re-evaluated prior to induction Oxygen Delivery Method: Circle system utilized Preoxygenation: Pre-oxygenation with 100% oxygen Induction Type: IV induction and Rapid sequence Ventilation: Mask ventilation without difficulty Laryngoscope Size: Mac and 3 Grade View: Grade I Tube type: Oral Tube size: 7.0 mm Number of attempts: 1 Airway Equipment and Method: Stylet and Oral airway Placement Confirmation: ETT inserted through vocal cords under direct vision,  positive ETCO2 and breath sounds checked- equal and bilateral Secured at: 22 cm Tube secured with: Tape Dental Injury: Teeth and Oropharynx as per pre-operative assessment

## 2020-01-26 NOTE — Anesthesia Postprocedure Evaluation (Signed)
Anesthesia Post Note  Patient: Barbara Hill  Procedure(s) Performed: FASCIOTOMY (Bilateral Leg Lower)     Patient location during evaluation: PACU Anesthesia Type: General Level of consciousness: sedated Pain management: pain level controlled Vital Signs Assessment: post-procedure vital signs reviewed and stable Respiratory status: spontaneous breathing and respiratory function stable Cardiovascular status: stable Postop Assessment: no apparent nausea or vomiting Anesthetic complications: no    Last Vitals:  Vitals:   01/26/20 1107 01/26/20 1137  BP: (!) 118/49 (!) 121/50  Pulse: 78 73  Resp: 18 16  Temp: 36.7 C 36.7 C  SpO2: 97% 92%    Last Pain:  Vitals:   01/26/20 1137  TempSrc: Oral  PainSc: 0-No pain                 Kayode Petion DANIEL

## 2020-01-26 NOTE — Progress Notes (Addendum)
Progress Note    01/26/2020 7:29 AM 1 Day Post-Op  Subjective:  C/o pain in her calves bilaterally with left worse than right   Tm 99.5 HR 60's-100's NSR 40'G-867'Y systolic 19% RA  Vitals:   01/25/20 2331 01/26/20 0315  BP: 125/60 (!) 129/56  Pulse: 67 65  Resp: 14 16  Temp: 97.6 F (36.4 C) 99.5 F (37.5 C)  SpO2: 94% 96%    Physical Exam: Cardiac:  regular Lungs:  Non labored Incisions:  Bilateral BK incisions with bloody ooze but in tact.  Extremities:  Brisk right DP and left DP/PT; tenderness to bilateral calves and anterior compartments bilaterally and with dorsiflexion.   CBC    Component Value Date/Time   WBC 10.1 01/26/2020 0315   RBC 2.91 (L) 01/26/2020 0315   HGB 8.1 (L) 01/26/2020 0315   HGB 10.6 (L) 04/10/2019 1047   HCT 25.5 (L) 01/26/2020 0315   HCT 31.9 (L) 04/10/2019 1047   PLT 232 01/26/2020 0315   PLT 308 04/10/2019 1047   MCV 87.6 01/26/2020 0315   MCV 91 04/10/2019 1047   MCV 92 10/23/2013 0803   MCH 27.8 01/26/2020 0315   MCHC 31.8 01/26/2020 0315   RDW 13.1 01/26/2020 0315   RDW 11.3 (L) 04/10/2019 1047   RDW 14.0 10/23/2013 0803   LYMPHSABS 2.6 01/24/2020 2200   LYMPHSABS 2.4 10/23/2013 0803   MONOABS 0.7 01/24/2020 2200   MONOABS 0.7 10/23/2013 0803   EOSABS 0.3 01/24/2020 2200   EOSABS 0.3 10/23/2013 0803   BASOSABS 0.0 01/24/2020 2200   BASOSABS 0.1 10/23/2013 0803    BMET    Component Value Date/Time   NA 137 01/26/2020 0315   NA 139 04/10/2019 1047   NA 140 10/23/2013 0803   K 3.4 (L) 01/26/2020 0315   K 4.6 10/23/2013 0803   CL 100 01/26/2020 0315   CL 107 10/23/2013 0803   CO2 28 01/26/2020 0315   CO2 28 10/23/2013 0803   GLUCOSE 133 (H) 01/26/2020 0315   GLUCOSE 87 10/23/2013 0803   BUN 14 01/26/2020 0315   BUN 19 04/10/2019 1047   BUN 6 (L) 10/23/2013 0803   CREATININE 1.15 (H) 01/26/2020 0315   CREATININE 0.66 07/24/2016 1125   CALCIUM 8.5 (L) 01/26/2020 0315   CALCIUM 8.8 10/23/2013 0803   GFRNONAA  46 (L) 01/26/2020 0315   GFRNONAA >60 10/23/2013 0803   GFRAA 53 (L) 01/26/2020 0315   GFRAA >60 10/23/2013 0803    INR    Component Value Date/Time   INR 1.11 11/12/2016 0953   INR 1.1 10/22/2013 0125     Intake/Output Summary (Last 24 hours) at 01/26/2020 0729 Last data filed at 01/26/2020 0000 Gross per 24 hour  Intake 328.89 ml  Output 45 ml  Net 283.89 ml     Assessment:  79 y.o. female is s/p:  Bilateral popliteal exploration and embolectomy  1 Day Post-Op  Plan: -pt with brisk right DP and left DP/PT; motor and sensory are in tact.  -c/o pain in her legs-bilateral but left great than right with palpation but pain in both with laying in bed.  Calves are somewhat soft but tender to palpation as well and dorsiflexion.  Dr. Donnetta Hutching to evaluate pt this am. Pt is npo.  Will take to or for BLE fasciotomy urgently.  PT NEEDS TO GO TO THE OR PRIOR TO GETTING TEE. -DVT prophylaxis:  Pt is on heparin gtt   Leontine Locket, PA-C [facility] [phone]  01/26/2020 7:29 AM  I have examined the patient, reviewed and agree with above.  No evidence of lower extremity ischemia.  Did have prolonged ischemia time however occlusion she was at the popliteal level.  She is tender over her anterior compartment as well as posteriorly.  Discussed my recommendation for 4 compartment fasciotomies and the follow-up of this with potential open wounds potential VAC and potential skin graft.  Gretta Began, MD 01/26/2020 8:48 AM

## 2020-01-26 NOTE — Progress Notes (Signed)
Progress Note  Patient Name: Barbara Hill Date of Encounter: 01/26/2020  Primary Cardiologist:   Dietrich Pates, MD   Subjective   She denies pain or SOB.  No status post fasciotomy.    Inpatient Medications    Scheduled Meds: . Chlorhexidine Gluconate Cloth  6 each Topical Daily  . clopidogrel  75 mg Oral Daily  . flecainide  50 mg Oral BID  . FLUoxetine  20 mg Oral BID  . metoprolol succinate  25 mg Oral QHS  . pantoprazole  40 mg Oral Daily  . sodium chloride flush  3 mL Intravenous Q12H   Continuous Infusions: . ceFAZolin    . heparin 750 Units/hr (01/26/20 0000)  . lactated ringers 10 mL/hr at 01/26/20 0906   PRN Meds: acetaminophen **OR** acetaminophen, ALPRAZolam, morphine injection, ondansetron **OR** ondansetron (ZOFRAN) IV, oxyCODONE   Vital Signs    Vitals:   01/26/20 1040 01/26/20 1052 01/26/20 1107 01/26/20 1137  BP: 133/66 (!) 119/49 (!) 118/49 (!) 121/50  Pulse: 77 77 78 73  Resp: 18 18 18 16   Temp: 98.3 F (36.8 C)  98.1 F (36.7 C) 98 F (36.7 C)  TempSrc:    Oral  SpO2: 100% 100% 97% 92%  Weight:      Height:        Intake/Output Summary (Last 24 hours) at 01/26/2020 1220 Last data filed at 01/26/2020 1043 Gross per 24 hour  Intake 888.89 ml  Output 500 ml  Net 388.89 ml   Filed Weights   01/24/20 2115  Weight: 48.5 kg    Telemetry    NSR - Personally Reviewed  ECG    NA - Personally Reviewed  Physical Exam   GEN: No acute distress.   Neck: No  JVD Cardiac: RRR, no murmurs, rubs, or gallops.  Respiratory: Clear  to auscultation bilaterally. GI: Soft, nontender, non-distended  MS: No  edema; No deformity. Status post fasciotomy Neuro:  Nonfocal  Psych: Normal affect   Labs    Chemistry Recent Labs  Lab 01/24/20 2200 01/26/20 0315  NA 140 137  K 3.9 3.4*  CL 104 100  CO2 28 28  GLUCOSE 102* 133*  BUN 18 14  CREATININE 1.21* 1.15*  CALCIUM 8.8* 8.5*  GFRNONAA 43* 46*  GFRAA 50* 53*  ANIONGAP 8 9      Hematology Recent Labs  Lab 01/24/20 2200 01/26/20 0315  WBC 6.1 10.1  RBC 3.56* 2.91*  HGB 10.0* 8.1*  HCT 31.5* 25.5*  MCV 88.5 87.6  MCH 28.1 27.8  MCHC 31.7 31.8  RDW 12.9 13.1  PLT 243 232    Cardiac EnzymesNo results for input(s): TROPONINI in the last 168 hours. No results for input(s): TROPIPOC in the last 168 hours.   BNPNo results for input(s): BNP, PROBNP in the last 168 hours.   DDimer No results for input(s): DDIMER in the last 168 hours.   Radiology    CT Angio Aortobifemoral W and/or Wo Contrast  Result Date: 01/25/2020 CLINICAL DATA:  Right lower leg pain and decreased pulses EXAM: CT ANGIOGRAPHY OF ABDOMINAL AORTA WITH ILIOFEMORAL RUNOFF TECHNIQUE: Multidetector CT imaging of the abdomen, pelvis and lower extremities was performed using the standard protocol during bolus administration of intravenous contrast. Multiplanar CT image reconstructions and MIPs were obtained to evaluate the vascular anatomy. CONTRAST:  42mL OMNIPAQUE IOHEXOL 350 MG/ML SOLN COMPARISON:  None. FINDINGS: VASCULAR Aorta: Abdominal aorta demonstrates mild atherosclerotic calcifications without aneurysmal dilatation. Celiac: Patent without evidence of aneurysm, dissection,  vasculitis or significant stenosis. SMA: Patent without evidence of aneurysm, dissection, vasculitis or significant stenosis. Renals: Single renal arteries are identified bilaterally and widely patent. IMA: Patent without evidence of aneurysm, dissection, vasculitis or significant stenosis. RIGHT Lower Extremity Inflow: Common iliac, external iliac and internal iliac arteries are widely patent. No aneurysmal dilatation is seen. Runoff: Common femoral artery and femoral bifurcation are within normal limits. Profundus femoral artery is occluded shortly after its origin. The superficial femoral artery is within normal limits. Mild atherosclerotic calcifications are noted. The popliteal artery is patent proximally however occludes  just beyond the knee joint. There is reconstitution of the anterior tibial artery just beyond its origin. The distal anterior tibial artery is not well visualized due to timing of contrast bolus. The tibioperoneal trunk reconstitutes shortly after its origin with evidence of diffuse atherosclerotic calcifications. The peroneal artery is diminutive. The posterior tibial artery continues to the level of the foot. LEFT Lower Extremity Inflow: Common iliac artery, internal and external iliac arteries are patent. Runoff: Common femoral artery and femoral bifurcation are patent. Superficial femoral artery is widely patent with mild atherosclerotic calcifications. The popliteal artery is patent throughout its course with the exception of the distal most aspect where there is a filling defect identified suspicious for embolus. Anterior tibial artery and tibioperoneal trunk are visualized just beyond this filling defect and patent. The anterior tibial artery continues into the mid calf and is diminutive distally. Primary runoff is noted via the posterior tibial artery to the level of the ankle and subsequently into the foot. The peroneal artery is diminutive. Veins: No specific venous abnormality is noted. Review of the MIP images confirms the above findings. NON-VASCULAR Lower chest: Mild bibasilar atelectasis is noted right greater than left. Hepatobiliary: No focal liver abnormality is seen. Status post cholecystectomy. No biliary dilatation. Pancreas: Unremarkable. No pancreatic ductal dilatation or surrounding inflammatory changes. Spleen: Normal in size without focal abnormality. Adrenals/Urinary Tract: Adrenal glands are within normal limits bilaterally. Kidneys demonstrate a normal enhancement pattern. Bladder is partially distended. Stomach/Bowel: Diverticular change of the colon is noted without evidence of diverticulitis. No obstructive or inflammatory change of the bowel is seen. The appendix is not visualized  consistent with a prior surgical history. Lymphatic: No specific lymphadenopathy is noted. Reproductive: Uterus has been surgically removed. No adnexal mass is noted. Other: No abdominal wall hernia or abnormality. No abdominopelvic ascites. Musculoskeletal: No acute or significant osseous findings. IMPRESSION: VASCULAR Atherosclerotic calcifications are noted throughout the arterial tree. Short segment occlusion of the right distal popliteal artery is noted with reconstitution of the anterior tibial artery and tibioperoneal trunk as described above. Dominant runoff is noted via the posterior tibial artery. Short segment occlusion versus high-grade stenosis of the left popliteal artery distally which appears to be related to a focal embolus. Reconstitution of the anterior tibial and tibioperoneal trunk is seen with dominant runoff via the posterior tibial artery. NON-VASCULAR Diverticular change without diverticulitis. Basilar atelectasis right greater than left. No acute abnormality noted. Electronically Signed   By: Alcide Clever M.D.   On: 01/25/2020 02:59   ECHOCARDIOGRAM COMPLETE  Result Date: 01/25/2020    ECHOCARDIOGRAM REPORT   Patient Name:   LEVORA WERDEN Date of Exam: 01/25/2020 Medical Rec #:  [id number]      Height:       63.0 in Accession #:    [id number]     Weight:       107.0 lb Date of Birth:  06/25/1941  BSA:          1.482 m Patient Age:    80 years       BP:           112/50 mmHg Patient Gender: F              HR:           66 bpm. Exam Location:  Inpatient Procedure: 2D Echo, Pediatric Echo and Cardiac Doppler Indications:    Embolism  History:        Patient has prior history of Echocardiogram examinations, most                 recent 04/01/2016. COPD and Stroke, Arrythmias:Atrial Fibrillation                 and SVT; Risk Factors:Hypertension and Current Smoker. S/p                 Popliteal surgery this.  Sonographer:    Dustin Flock Referring Phys: [phone] [place]   1. Left ventricular ejection fraction, by estimation, is 60 to 65%. The left ventricle has normal function. The left ventricle has no regional wall motion abnormalities. There is mild left ventricular hypertrophy. Left ventricular diastolic parameters are consistent with Grade II diastolic dysfunction (pseudonormalization).  2. Right ventricular systolic function is normal. The right ventricular size is normal. There is normal pulmonary artery systolic pressure.  3. The mitral valve is normal in structure. Trivial mitral valve regurgitation. No evidence of mitral stenosis.  4. The aortic valve is normal in structure. Aortic valve regurgitation is mild. No aortic stenosis is present. FINDINGS  Left Ventricle: Left ventricular ejection fraction, by estimation, is 60 to 65%. The left ventricle has normal function. The left ventricle has no regional wall motion abnormalities. The left ventricular internal cavity size was normal in size. There is  mild left ventricular hypertrophy. Left ventricular diastolic parameters are consistent with Grade II diastolic dysfunction (pseudonormalization). Right Ventricle: The right ventricular size is normal. No increase in right ventricular wall thickness. Right ventricular systolic function is normal. There is normal pulmonary artery systolic pressure. The tricuspid regurgitant velocity is 2.70 m/s, and  with an assumed right atrial pressure of 3 mmHg, the estimated right ventricular systolic pressure is 73.4 mmHg. Left Atrium: Left atrial size was normal in size. Right Atrium: Right atrial size was normal in size. Pericardium: Trivial pericardial effusion is present. Mitral Valve: The mitral valve is normal in structure. Trivial mitral valve regurgitation. No evidence of mitral valve stenosis. Tricuspid Valve: The tricuspid valve is normal in structure. Tricuspid valve regurgitation is trivial. Aortic Valve: The aortic valve is normal in structure. Aortic valve regurgitation is  mild. Aortic regurgitation PHT measures 605 msec. No aortic stenosis is present. Pulmonic Valve: The pulmonic valve was normal in structure. Pulmonic valve regurgitation is trivial. No evidence of pulmonic stenosis. Aorta: The aortic root and ascending aorta are structurally normal, with no evidence of dilitation. IAS/Shunts: The atrial septum is grossly normal.  LEFT VENTRICLE PLAX 2D LVIDd:         3.50 cm  Diastology LVIDs:         2.40 cm  LV e' lateral:   5.87 cm/s LV PW:         1.20 cm  LV E/e' lateral: 9.4 LV IVS:        1.30 cm  LV e' medial:    5.55 cm/s LVOT diam:  2.00 cm  LV E/e' medial:  10.0 LV SV:         71 LV SV Index:   48 LVOT Area:     3.14 cm  RIGHT VENTRICLE RV Basal diam:  2.40 cm RV S prime:     6.09 cm/s TAPSE (M-mode): 2.5 cm LEFT ATRIUM             Index       RIGHT ATRIUM           Index LA diam:        3.30 cm 2.23 cm/m  RA Area:     13.30 cm LA Vol (A2C):   43.2 ml 29.14 ml/m RA Volume:   35.00 ml  23.61 ml/m LA Vol (A4C):   55.1 ml 37.17 ml/m LA Biplane Vol: 50.6 ml 34.14 ml/m  AORTIC VALVE LVOT Vmax:   95.30 cm/s LVOT Vmean:  68.700 cm/s LVOT VTI:    0.227 m AI PHT:      605 msec  AORTA Ao Root diam: 3.20 cm MITRAL VALVE               TRICUSPID VALVE MV Area (PHT): 2.42 cm    TR Peak grad:   29.2 mmHg MV Decel Time: 313 msec    TR Vmax:        270.00 cm/s MV E velocity: 55.30 cm/s MV A velocity: 62.60 cm/s  SHUNTS MV E/A ratio:  0.88        Systemic VTI:  0.23 m                            Systemic Diam: 2.00 cm Kristeen Miss MD Electronically signed by Kristeen Miss MD Signature Date/Time: 01/25/2020/2:23:38 PM    Final     Cardiac Studies   ECHO  1. Left ventricular ejection fraction, by estimation, is 60 to 65%. The  left ventricle has normal function. The left ventricle has no regional  wall motion abnormalities. There is mild left ventricular hypertrophy.  Left ventricular diastolic parameters  are consistent with Grade II diastolic dysfunction  (pseudonormalization).  2. Right ventricular systolic function is normal. The right ventricular  size is normal. There is normal pulmonary artery systolic pressure.  3. The mitral valve is normal in structure. Trivial mitral valve  regurgitation. No evidence of mitral stenosis.  4. The aortic valve is normal in structure. Aortic valve regurgitation is  mild. No aortic stenosis is present.   Patient Profile     79 y.o. 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 Popliteal exploration and embolectomy who is being seen for the evaluation of anticoagualtion at the request of Dr. Arbie Cookey .  She had urgent fasciotomy today.   Assessment & Plan    BILATERAL POPLITEAL EMBOLI:   Resume anticoagulation when OK with surgery.  Will need DOAC. I will reschedule the TEE until Monday.   SVT:  No sustained arrhythmias.  No evidence of atrial fib.    For questions or updates, please contact [facility] Please consult [email or web address] for contact info under Cardiology/STEMI.   Signed, Rollene Rotunda, MD  01/26/2020, 12:20 PM

## 2020-01-26 NOTE — Progress Notes (Signed)
Discussed patient's covid test with infection prevention, Kara.   Per infection prevention, PCR test is negative and no isolation needed for patient.

## 2020-01-26 NOTE — Op Note (Signed)
    OPERATIVE REPORT  DATE OF SURGERY: 01/26/2020  PATIENT: Barbara Hill, 79 y.o. female MRN: [id number]  DOB: 26-Apr-1941  PRE-OPERATIVE DIAGNOSIS: Compartment syndrome bilateral lower extremities  POST-OPERATIVE DIAGNOSIS:  Same  PROCEDURE: Bilateral lower extremity 4 compartment fasciotomy  SURGEON:  Gretta Began, M.D.  PHYSICIAN ASSISTANT: Darlin Coco, PA-C  ANESTHESIA: General  EBL: per anesthesia record  Total I/O In: 800 [I.V.:750; IV Piggyback:50] Out: 500 [Urine:350; Blood:150]  BLOOD ADMINISTERED: none  DRAINS: none  SPECIMEN: none  COUNTS CORRECT:  YES  PATIENT DISPOSITION:  PACU - hemodynamically stable  PROCEDURE DETAILS: Patient is 24 hours status post bilateral popliteal embolectomy for presumed cardiogenic embolus.  On examination this morning she has excellent flow to her feet bilaterally.  She is very tender in her anterior compartments bilaterally no so in her calf more so than to be anticipated for calf incision.  I have recommended fasciotomy.  Patient was taken operating placed to position with area both legs prepped draped you sterile fashion.  The medial popliteal incisions were opened bilaterally.  There was some hematoma present.  The fascia was opened throughout the area of down to the near the above the level of the ankle.  Separate incision was made laterally over the anterior compartments bilaterally in the anterior and posterior lateral compartments were opened.  The wounds were irrigated with saline.  Hemostasis to electrocautery.  The skin was able to be closed at all 4 incisions and this was accomplished with skin staples.  Sterile dressing and Ace wrap were placed and the patient was transferred to the recovery room in stable condition   [name], M.D., [facility] 01/26/2020 11:31 AM

## 2020-01-26 NOTE — Progress Notes (Signed)
Pt returned from OR.  Pt A&O X 2, disoriented to place and situation.  Pt neuro intact.  Pt placed back on telemetry and CCMD notified.  Vitals taken and all within normal range, with the exception of BP: 121/50.  Pt has mild sanguinous bleeding from both incisions currently.  Pt is comfortable and will continue to be monitored per protocol.

## 2020-01-26 NOTE — Progress Notes (Signed)
PT Cancellation Note  Patient Details Name: Barbara Hill MRN: [id number] DOB: Mar 23, 1941   Cancelled Treatment:    Reason Eval/Treat Not Completed: Patient at procedure or test/unavailable Pt in OR for urgent bilateral fasciotomies this AM. Will follow up tomorrow as appropriate.    Blake Divine A Macy Polio 01/26/2020, 12:51 PM [place], PT, DPT Acute Rehabilitation Services Pager [phone] Office [phone]

## 2020-01-26 NOTE — Progress Notes (Signed)
PROGRESS NOTE    Barbara Hill  SJG:[id number] DOB: 1940/11/24 DOA: 01/24/2020 PCP: Richmond Campbell., PA-C    Brief Narrative:  79 y.o. 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  History is obtained from the patient, but she seems intermittently confused and is not aware of the date and ask for her daughter who is not been by per nursing staff.  Her right leg pain started acutely and was severe.  A couple days prior she says that her heart was beating very fast for which EMS was called, but by their arrival was beating normally.  She had also had long-term Holter monitoring performed in March of this year which showed no significant arrhythmias.  She does admit to still smoking cigarettes, but reports that she will quit as she will related with the pain that she had in her right leg that brought her to the hospital.  ED Course: Upon admission into the emergency department patient was noted to be afebrile with a pressure elevated up to 217/84, heart rates intermittently fluctuating from 60-130, and all other vital signs maintained.  Labs are significant for hemoglobin 10, BUN 18, and creatinine 1.21.  CT scan of the abdomen pelvis revealed bilateral popliteal embolus.  Patient was taken immediately to the operating grading room by vascular surgery for bilateral popliteal embolectomy.  Assessment & Plan:   Principal Problem:   Ischemic leg Active Problems:   Paroxysmal SVT (supraventricular tachycardia) (HCC)   Tobacco use disorder   COPD (chronic obstructive pulmonary disease) (HCC)   Normocytic anemia   History of CVA (cerebrovascular accident)   Anxiety   CKD (chronic kidney disease), stage III  Bilateral ischemic legs: Acute.   -Pt with concerns of bilateral popliteal embolic  disease -Vascular Surgery consulted and patient underwent B popliteal exploration and embolectomy on 6/3 -Pt is continued on heparin gtt -This AM, patient complained of increased LE pain and on re-evaluation by Vascular Surgery, decision made for 4 compartment fasciotomies with potential VAC and skin grafting  Paroxysmal SVT:  -On admission patient appeared to be in sinus rhythm.   -Cont with home flecainide 50mg  twice daily -On heparin gtt per above  COPD/tobacco use:  -Patient without signs of acute exacerbation of her COPD.   -Reportedly declined nicotine patch on presentation -cont with Albuterol nebs as needed shortness of breath/wheezing -cont cessation  Memory deficit:  -Patient noted issues with memory and possibly confused at times.   -Unclear if this is related with previous stroke or new. -Neurochecks  Normocytic anemia:  -Hemoglobin currently 8.1 -Continue to monitor on anticoagulation  History of CVA:  -Patient with previous history of stroke following procedure in 2017. -Seems to be stable at this time  Essential hypertension:  -Home medications include losartan 25 mg daily and metoprolol 25 mg nightly -Continued on metoprolol -BP soft this afternoon, since improved. Will cont to monitor  Chronic kidney disease stage IIIb: Patient presents with creatinine of 1.21 which appears within her baseline of 1.2-1.3. -repeat bmet in AM  Anxiety: Continue Xanax 0.5 mg 3 times daily as needed and fluoxetine 20 mg twice daily -Continue Xanax and fluoxetine as tolerated  GERD: Home meds include omeprazole 20 mg daily -Continue with protonix as tolerated  DVT prophylaxis: Heparin gtt Code Status: Full Family Communication: Pt in room, family not  at bedside  Status is: Inpatient  Remains inpatient appropriate because:Hemodynamically unstable, Ongoing diagnostic testing needed not appropriate for outpatient work up, IV treatments appropriate due to intensity  of illness or inability to take PO and Inpatient level of care appropriate due to severity of illness   Dispo: The patient is from: Home              Anticipated d/c is to: pending PT eval              Anticipated d/c date is: > 3 days              Patient currently is not medically stable to d/c.       Consultants:   Vascular Surgery  Procedures:  B popliteal exploration and embolectomy on 6/3 Emergent 4 compartment fasciotomy 6/4  Antimicrobials: Anti-infectives (From admission, onward)   Start     Dose/Rate Route Frequency Ordered Stop   01/26/20 0918  ceFAZolin (ANCEF) 2-4 GM/100ML-% IVPB    Note to Pharmacy: Evern Bio   : cabinet override      01/26/20 0918 01/26/20 2129       Subjective: Complaining of BLE pains this AM. Seen as pt was preparing to go to OR emergently this AM  Objective: Vitals:   01/26/20 1052 01/26/20 1107 01/26/20 1137 01/26/20 1455  BP: (!) 119/49 (!) 118/49 (!) 121/50 100/73  Pulse: 77 78 73 70  Resp: 18 18 16 18   Temp:  98.1 F (36.7 C) 98 F (36.7 C) 98 F (36.7 C)  TempSrc:   Oral Oral  SpO2: 100% 97% 92% 93%  Weight:      Height:        Intake/Output Summary (Last 24 hours) at 01/26/2020 1729 Last data filed at 01/26/2020 1717 Gross per 24 hour  Intake 1008.89 ml  Output 725 ml  Net 283.89 ml   Filed Weights   01/24/20 2115  Weight: 48.5 kg    Examination:  General exam: Appears calm and comfortable  Respiratory system: Clear to auscultation. Respiratory effort normal. Cardiovascular system: S1 & S2 heard, Regular Gastrointestinal system: Abdomen is nondistended, soft and nontender. No organomegaly or masses felt. Normal bowel sounds heard. Central nervous system: Alert and oriented. No focal neurological deficits. Extremities: Symmetric 5 x 5 power. Skin: No rashes, lesions, BLE with surgical dressings in place Psychiatry: Judgement and insight appear normal. Mood & affect appropriate.   Data Reviewed: I have  personally reviewed following labs and imaging studies  CBC: Recent Labs  Lab 01/24/20 2200 01/26/20 0315  WBC 6.1 10.1  NEUTROABS 2.5  --   HGB 10.0* 8.1*  HCT 31.5* 25.5*  MCV 88.5 87.6  PLT 243 232   Basic Metabolic Panel: Recent Labs  Lab 01/24/20 2200 01/26/20 0315  NA 140 137  K 3.9 3.4*  CL 104 100  CO2 28 28  GLUCOSE 102* 133*  BUN 18 14  CREATININE 1.21* 1.15*  CALCIUM 8.8* 8.5*   GFR: Estimated Creatinine Clearance: 31.4 mL/min (A) (by C-G formula based on SCr of 1.15 mg/dL (H)). Liver Function Tests: No results for input(s): AST, ALT, ALKPHOS, BILITOT, PROT, ALBUMIN in the last 168 hours. No results for input(s): LIPASE, AMYLASE in the last 168 hours. No results for input(s): AMMONIA in the last 168 hours. Coagulation Profile: No results for input(s): INR, PROTIME in the last 168 hours. Cardiac Enzymes: No results for input(s): CKTOTAL, CKMB, CKMBINDEX, TROPONINI in the last 168 hours. BNP (last 3 results)  No results for input(s): PROBNP in the last 8760 hours. HbA1C: No results for input(s): HGBA1C in the last 72 hours. CBG: No results for input(s): GLUCAP in the last 168 hours. Lipid Profile: No results for input(s): CHOL, HDL, LDLCALC, TRIG, CHOLHDL, LDLDIRECT in the last 72 hours. Thyroid Function Tests: Recent Labs    01/25/20 1947  TSH 0.372   Anemia Panel: No results for input(s): VITAMINB12, FOLATE, FERRITIN, TIBC, IRON, RETICCTPCT in the last 72 hours. Sepsis Labs: No results for input(s): PROCALCITON, LATICACIDVEN in the last 168 hours.  Recent Results (from the past 240 hour(s))  SARS Coronavirus 2 by RT PCR (hospital order, performed in [facility] hospital lab) Nasopharyngeal Nasopharyngeal Swab     Status: None   Collection Time: 01/25/20  4:23 AM   Specimen: Nasopharyngeal Swab  Result Value Ref Range Status   SARS Coronavirus 2 NEGATIVE NEGATIVE Final    Comment: (NOTE) SARS-CoV-2 target nucleic acids are NOT DETECTED. The  SARS-CoV-2 RNA is generally detectable in upper and lower respiratory specimens during the acute phase of infection. The lowest concentration of SARS-CoV-2 viral copies this assay can detect is 250 copies / mL. A negative result does not preclude SARS-CoV-2 infection and should not be used as the sole basis for treatment or other patient management decisions.  A negative result may occur with improper specimen collection / handling, submission of specimen other than nasopharyngeal swab, presence of viral mutation(s) within the areas targeted by this assay, and inadequate number of viral copies (<250 copies / mL). A negative result must be combined with clinical observations, patient history, and epidemiological information. Fact Sheet for Patients:   BoilerBrush.com.cy Fact Sheet for Healthcare Providers: https://pope.com/ This test is not yet approved or cleared  by the [place] FDA and has been authorized for detection and/or diagnosis of SARS-CoV-2 by FDA under an Emergency Use Authorization (EUA).  This EUA will remain in effect (meaning this test can be used) for the duration of the COVID-19 declaration under Section 564(b)(1) of the Act, 21 U.S.C. section 360bbb-3(b)(1), unless the authorization is terminated or revoked sooner. Performed at [facility], [address], [place], [place] [address]      Radiology Studies: CT Angio Aortobifemoral W and/or Wo Contrast  Result Date: 01/25/2020 CLINICAL DATA:  Right lower leg pain and decreased pulses EXAM: CT ANGIOGRAPHY OF ABDOMINAL AORTA WITH ILIOFEMORAL RUNOFF TECHNIQUE: Multidetector CT imaging of the abdomen, pelvis and lower extremities was performed using the standard protocol during bolus administration of intravenous contrast. Multiplanar CT image reconstructions and MIPs were obtained to evaluate the vascular anatomy. CONTRAST:  42mL OMNIPAQUE IOHEXOL 350 MG/ML SOLN  COMPARISON:  None. FINDINGS: VASCULAR Aorta: Abdominal aorta demonstrates mild atherosclerotic calcifications without aneurysmal dilatation. Celiac: Patent without evidence of aneurysm, dissection, vasculitis or significant stenosis. SMA: Patent without evidence of aneurysm, dissection, vasculitis or significant stenosis. Renals: Single renal arteries are identified bilaterally and widely patent. IMA: Patent without evidence of aneurysm, dissection, vasculitis or significant stenosis. RIGHT Lower Extremity Inflow: Common iliac, external iliac and internal iliac arteries are widely patent. No aneurysmal dilatation is seen. Runoff: Common femoral artery and femoral bifurcation are within normal limits. Profundus femoral artery is occluded shortly after its origin. The superficial femoral artery is within normal limits. Mild atherosclerotic calcifications are noted. The popliteal artery is patent proximally however occludes just beyond the knee joint. There is reconstitution of the anterior tibial artery just beyond its origin. The distal anterior tibial artery is not well  visualized due to timing of contrast bolus. The tibioperoneal trunk reconstitutes shortly after its origin with evidence of diffuse atherosclerotic calcifications. The peroneal artery is diminutive. The posterior tibial artery continues to the level of the foot. LEFT Lower Extremity Inflow: Common iliac artery, internal and external iliac arteries are patent. Runoff: Common femoral artery and femoral bifurcation are patent. Superficial femoral artery is widely patent with mild atherosclerotic calcifications. The popliteal artery is patent throughout its course with the exception of the distal most aspect where there is a filling defect identified suspicious for embolus. Anterior tibial artery and tibioperoneal trunk are visualized just beyond this filling defect and patent. The anterior tibial artery continues into the mid calf and is diminutive  distally. Primary runoff is noted via the posterior tibial artery to the level of the ankle and subsequently into the foot. The peroneal artery is diminutive. Veins: No specific venous abnormality is noted. Review of the MIP images confirms the above findings. NON-VASCULAR Lower chest: Mild bibasilar atelectasis is noted right greater than left. Hepatobiliary: No focal liver abnormality is seen. Status post cholecystectomy. No biliary dilatation. Pancreas: Unremarkable. No pancreatic ductal dilatation or surrounding inflammatory changes. Spleen: Normal in size without focal abnormality. Adrenals/Urinary Tract: Adrenal glands are within normal limits bilaterally. Kidneys demonstrate a normal enhancement pattern. Bladder is partially distended. Stomach/Bowel: Diverticular change of the colon is noted without evidence of diverticulitis. No obstructive or inflammatory change of the bowel is seen. The appendix is not visualized consistent with a prior surgical history. Lymphatic: No specific lymphadenopathy is noted. Reproductive: Uterus has been surgically removed. No adnexal mass is noted. Other: No abdominal wall hernia or abnormality. No abdominopelvic ascites. Musculoskeletal: No acute or significant osseous findings. IMPRESSION: VASCULAR Atherosclerotic calcifications are noted throughout the arterial tree. Short segment occlusion of the right distal popliteal artery is noted with reconstitution of the anterior tibial artery and tibioperoneal trunk as described above. Dominant runoff is noted via the posterior tibial artery. Short segment occlusion versus high-grade stenosis of the left popliteal artery distally which appears to be related to a focal embolus. Reconstitution of the anterior tibial and tibioperoneal trunk is seen with dominant runoff via the posterior tibial artery. NON-VASCULAR Diverticular change without diverticulitis. Basilar atelectasis right greater than left. No acute abnormality noted.  Electronically Signed   By: Alcide Clever M.D.   On: 01/25/2020 02:59   ECHOCARDIOGRAM COMPLETE  Result Date: 01/25/2020    ECHOCARDIOGRAM REPORT   Patient Name:   SUNDY HOUCHINS Date of Exam: 01/25/2020 Medical Rec #:  [id number]      Height:       63.0 in Accession #:    [id number]     Weight:       107.0 lb Date of Birth:  [birth year]       BSA:          1.482 m Patient Age:    78 years       BP:           112/50 mmHg Patient Gender: F              HR:           66 bpm. Exam Location:  Inpatient Procedure: 2D Echo, Pediatric Echo and Cardiac Doppler Indications:    Embolism  History:        Patient has prior history of Echocardiogram examinations, most                 recent 04/01/2016.  COPD and Stroke, Arrythmias:Atrial Fibrillation                 and SVT; Risk Factors:Hypertension and Current Smoker. S/p                 Popliteal surgery this.  Sonographer:    Lavenia AtlasBrooke Strickland Referring Phys: [phone] RONDELL A SMITH IMPRESSIONS  1. Left ventricular ejection fraction, by estimation, is 60 to 65%. The left ventricle has normal function. The left ventricle has no regional wall motion abnormalities. There is mild left ventricular hypertrophy. Left ventricular diastolic parameters are consistent with Grade II diastolic dysfunction (pseudonormalization).  2. Right ventricular systolic function is normal. The right ventricular size is normal. There is normal pulmonary artery systolic pressure.  3. The mitral valve is normal in structure. Trivial mitral valve regurgitation. No evidence of mitral stenosis.  4. The aortic valve is normal in structure. Aortic valve regurgitation is mild. No aortic stenosis is present. FINDINGS  Left Ventricle: Left ventricular ejection fraction, by estimation, is 60 to 65%. The left ventricle has normal function. The left ventricle has no regional wall motion abnormalities. The left ventricular internal cavity size was normal in size. There is  mild left ventricular hypertrophy. Left  ventricular diastolic parameters are consistent with Grade II diastolic dysfunction (pseudonormalization). Right Ventricle: The right ventricular size is normal. No increase in right ventricular wall thickness. Right ventricular systolic function is normal. There is normal pulmonary artery systolic pressure. The tricuspid regurgitant velocity is 2.70 m/s, and  with an assumed right atrial pressure of 3 mmHg, the estimated right ventricular systolic pressure is 32.2 mmHg. Left Atrium: Left atrial size was normal in size. Right Atrium: Right atrial size was normal in size. Pericardium: Trivial pericardial effusion is present. Mitral Valve: The mitral valve is normal in structure. Trivial mitral valve regurgitation. No evidence of mitral valve stenosis. Tricuspid Valve: The tricuspid valve is normal in structure. Tricuspid valve regurgitation is trivial. Aortic Valve: The aortic valve is normal in structure. Aortic valve regurgitation is mild. Aortic regurgitation PHT measures 605 msec. No aortic stenosis is present. Pulmonic Valve: The pulmonic valve was normal in structure. Pulmonic valve regurgitation is trivial. No evidence of pulmonic stenosis. Aorta: The aortic root and ascending aorta are structurally normal, with no evidence of dilitation. IAS/Shunts: The atrial septum is grossly normal.  LEFT VENTRICLE PLAX 2D LVIDd:         3.50 cm  Diastology LVIDs:         2.40 cm  LV e' lateral:   5.87 cm/s LV PW:         1.20 cm  LV E/e' lateral: 9.4 LV IVS:        1.30 cm  LV e' medial:    5.55 cm/s LVOT diam:     2.00 cm  LV E/e' medial:  10.0 LV SV:         71 LV SV Index:   48 LVOT Area:     3.14 cm  RIGHT VENTRICLE RV Basal diam:  2.40 cm RV S prime:     6.09 cm/s TAPSE (M-mode): 2.5 cm LEFT ATRIUM             Index       RIGHT ATRIUM           Index LA diam:        3.30 cm 2.23 cm/m  RA Area:     13.30 cm LA Vol (A2C):   43.2 ml  29.14 ml/m RA Volume:   35.00 ml  23.61 ml/m LA Vol (A4C):   55.1 ml 37.17 ml/m LA  Biplane Vol: 50.6 ml 34.14 ml/m  AORTIC VALVE LVOT Vmax:   95.30 cm/s LVOT Vmean:  68.700 cm/s LVOT VTI:    0.227 m AI PHT:      605 msec  AORTA Ao Root diam: 3.20 cm MITRAL VALVE               TRICUSPID VALVE MV Area (PHT): 2.42 cm    TR Peak grad:   29.2 mmHg MV Decel Time: 313 msec    TR Vmax:        270.00 cm/s MV E velocity: 55.30 cm/s MV A velocity: 62.60 cm/s  SHUNTS MV E/A ratio:  0.88        Systemic VTI:  0.23 m                            Systemic Diam: 2.00 cm Mertie Moores MD Electronically signed by Mertie Moores MD Signature Date/Time: 01/25/2020/2:23:38 PM    Final     Scheduled Meds: . Chlorhexidine Gluconate Cloth  6 each Topical Daily  . [START ON 01/27/2020] clopidogrel  75 mg Oral Daily  . flecainide  50 mg Oral BID  . FLUoxetine  20 mg Oral BID  . metoprolol succinate  25 mg Oral QHS  . pantoprazole  40 mg Oral Daily  . sodium chloride flush  3 mL Intravenous Q12H   Continuous Infusions: . ceFAZolin    . [START ON 01/27/2020] heparin       LOS: 1 day   Marylu Lund, MD [facility] Pager On Amion  If 7PM-7AM, please contact night-coverage 01/26/2020, 5:29 PM

## 2020-01-26 NOTE — Anesthesia Preprocedure Evaluation (Addendum)
Anesthesia Evaluation    Reviewed: Allergy & Precautions, H&P , Patient's Chart, lab work & pertinent test results, reviewed documented beta blocker date and time   History of Anesthesia Complications Negative for: history of anesthetic complications  Airway Mallampati: II   Neck ROM: full    Dental   Pulmonary COPD, Current Smoker and Patient abstained from smoking.,    breath sounds clear to auscultation       Cardiovascular hypertension, Pt. on medications and Pt. on home beta blockers + Peripheral Vascular Disease  + dysrhythmias  Rhythm:regular Rate:Normal  2017 tte:  Left ventricle: The cavity size was normal. Wall thickness was  increased in a pattern of mild LVH. Systolic function was normal.  The estimated ejection fraction was in the range of 60% to 65%.  Wall motion was normal; there were no regional wall motion  abnormalities. Features are consistent with a pseudonormal left  ventricular filling pattern, with concomitant abnormal relaxation  and increased filling pressure (grade 2 diastolic dysfunction).  - Aortic valve: There was mild regurgitation.  - Mitral valve: There was mild regurgitation.  - Left atrium: The atrium was mildly dilated.  - Right atrium: Central venous pressure (est): 3 mm Hg.  - Tricuspid valve: There was trivial regurgitation.  - Pulmonary arteries: Systolic pressure could not be accurately  estimated.  - Pericardium, extracardiac: There was no pericardial effusion.    Neuro/Psych  Headaches, PSYCHIATRIC DISORDERS Anxiety  Neuromuscular disease CVA    GI/Hepatic Neg liver ROS, GERD  Medicated and Controlled,  Endo/Other  negative endocrine ROS  Renal/GU Renal InsufficiencyRenal disease     Musculoskeletal   Abdominal   Peds  Hematology  (+) Blood dyscrasia, anemia , Plavix   Anesthesia Other Findings   Reproductive/Obstetrics                              Anesthesia Physical  Anesthesia Plan  ASA: III and emergent  Anesthesia Plan: General   Post-op Pain Management:    Induction: Intravenous  PONV Risk Score and Plan: 2 and Ondansetron and Dexamethasone  Airway Management Planned: Oral ETT  Additional Equipment: None  Intra-op Plan:   Post-operative Plan: Post-operative intubation/ventilation  Informed Consent:   Plan Discussed with: Anesthesiologist  Anesthesia Plan Comments:        Anesthesia Quick Evaluation

## 2020-01-26 NOTE — Transfer of Care (Signed)
Immediate Anesthesia Transfer of Care Note  Patient: Barbara Hill  Procedure(s) Performed: FASCIOTOMY (Bilateral Leg Lower)  Patient Location: PACU  Anesthesia Type:General  Level of Consciousness: drowsy  Airway & Oxygen Therapy: Patient Spontanous Breathing and Patient connected to face mask oxygen  Post-op Assessment: Report given to RN and Post -op Vital signs reviewed and stable  Post vital signs: Reviewed  Last Vitals:  Vitals Value Taken Time  BP 133/66 01/26/20 1040  Temp 36.8 C 01/26/20 1040  Pulse 78 01/26/20 1042  Resp 19 01/26/20 1042  SpO2 100 % 01/26/20 1042  Vitals shown include unvalidated device data.  Last Pain:  Vitals:   01/26/20 0744  TempSrc: Oral  PainSc:          Complications: No apparent anesthesia complications

## 2020-01-26 NOTE — Progress Notes (Addendum)
ANTICOAGULATION CONSULT NOTE - Follow Up Consult  Pharmacy Consult for Heparin Indication: s/p BL popliteal embolectomy  Allergies  Allergen Reactions  . Dobutamine Other (See Comments)    Heart beating hard with CP, neck pain, and weakness  . Epinephrine Other (See Comments)    Fast heart beat  . Darvocet [Propoxyphene N-Acetaminophen] Nausea And Vomiting  . Excedrin Extra Strength [Aspirin-Acetaminophen-Caffeine] Nausea And Vomiting  . Aspirin   . Clarithromycin Other (See Comments)    Yeast infection  . Doxycycline Other (See Comments)    Makes stomach hurt  . Penicillins Other (See Comments)    Yeast infection  Has patient had a PCN reaction causing immediate rash, facial/tongue/throat swelling, SOB or lightheadedness with hypotension: No Has patient had a PCN reaction causing severe rash involving mucus membranes or skin necrosis: No Has patient had a PCN reaction that required hospitalization No Has patient had a PCN reaction occurring within the last 10 years: No If all of the above answers are "NO", then may proceed with Cephalosporin use.   . Prednisone Other (See Comments)    Shakes   . Propoxyphene Nausea And Vomiting    Patient Measurements: Height: 5\' 3"  (160 cm) Weight: 48.5 kg (107 lb) IBW/kg (Calculated) : 52.4 Heparin Dosing Weight:  48.5 kg  Vital Signs: Temp: 99.1 F (37.3 C) (06/04 0744) Temp Source: Oral (06/04 0744) BP: 104/69 (06/04 0744) Pulse Rate: 68 (06/04 0744)  Labs: Recent Labs    01/24/20 2200 01/25/20 1947 01/26/20 0315  HGB 10.0*  --  8.1*  HCT 31.5*  --  25.5*  PLT 243  --  232  HEPARINUNFRC  --  0.62 0.64  CREATININE 1.21*  --  1.15*    Estimated Creatinine Clearance: 31.4 mL/min (A) (by C-G formula based on SCr of 1.15 mg/dL (H)).  Assessment: Anticoag: s/p BL LE emboli to popliteal arteries s/p embolectomy 6/3. Resume IV heparin post-op. Hep level 0.64 in goal. Baseline Hgb 10>>8.1 today. C/o pain. Some bloody  oozing.  Goal of Therapy:  Heparin level 0.3-0.7 units/ml Monitor platelets by anticoagulation protocol: Yes   Plan:  Con't IV heparin at 750 units/hr Daily HL and CBC To OR for BLE fasciotomy urgently    Negin Hegg S. 8/3, PharmD, BCPS Clinical Staff Pharmacist [email or web address] Merilynn Finland, Merilynn Finland 01/26/2020,7:57 AM

## 2020-01-26 NOTE — Progress Notes (Signed)
ANTICOAGULATION CONSULT NOTE - Follow Up Consult  Pharmacy Consult for Heparin Indication: s/p BL popliteal embolectomy  Allergies  Allergen Reactions  . Dobutamine Other (See Comments)    Heart beating hard with CP, neck pain, and weakness  . Epinephrine Other (See Comments)    Fast heart beat  . Darvocet [Propoxyphene N-Acetaminophen] Nausea And Vomiting  . Excedrin Extra Strength [Aspirin-Acetaminophen-Caffeine] Nausea And Vomiting  . Aspirin   . Clarithromycin Other (See Comments)    Yeast infection  . Doxycycline Other (See Comments)    Makes stomach hurt  . Penicillins Other (See Comments)    Yeast infection  Has patient had a PCN reaction causing immediate rash, facial/tongue/throat swelling, SOB or lightheadedness with hypotension: No Has patient had a PCN reaction causing severe rash involving mucus membranes or skin necrosis: No Has patient had a PCN reaction that required hospitalization No Has patient had a PCN reaction occurring within the last 10 years: No If all of the above answers are "NO", then may proceed with Cephalosporin use.   . Prednisone Other (See Comments)    Shakes   . Propoxyphene Nausea And Vomiting    Patient Measurements: Height: 5\' 3"  (160 cm) Weight: 48.5 kg (107 lb) IBW/kg (Calculated) : 52.4 Heparin Dosing Weight:  48.5 kg  Vital Signs: Temp: 98 F (36.7 C) (06/04 1137) Temp Source: Oral (06/04 1137) BP: 121/50 (06/04 1137) Pulse Rate: 73 (06/04 1137)  Labs: Recent Labs    01/24/20 2200 01/25/20 1947 01/26/20 0315  HGB 10.0*  --  8.1*  HCT 31.5*  --  25.5*  PLT 243  --  232  HEPARINUNFRC  --  0.62 0.64  CREATININE 1.21*  --  1.15*    Estimated Creatinine Clearance: 31.4 mL/min (A) (by C-G formula based on SCr of 1.15 mg/dL (H)).  Assessment: Anticoag: s/p BL LE emboli to popliteal arteries s/p embolectomy 6/3. Resume IV heparin post-op. Hep level 0.64 in goal. Baseline Hgb 10>>8.1 today. C/o pain. Some bloody  oozing>>6/4 to OR for urgent 4 compartment fasciotomy. Chatted Dr. 8/3 who would like to resume IV heparin 6/5 at 0600  Goal of Therapy:  Heparin level 0.3-0.7 units/ml Monitor platelets by anticoagulation protocol: Yes   Plan:  To OR for BLE fasciotomy urgently In AM 0600, resume IV heparin at 750 units/hr Will check heparin level 6 hrs later. Then Daily HL and CBC TEE Monday    Barbara Upperman S. Tuesday, PharmD, BCPS Clinical Staff Pharmacist [email or web address] Barbara Hill, Barbara Hill 01/26/2020,12:45 PM

## 2020-01-26 NOTE — Progress Notes (Signed)
  Progress Note    01/26/2020 3:26 PM Day of Surgery  Subjective:  She says her legs are less painful   Vitals:   01/26/20 1137 01/26/20 1455  BP: (!) 121/50 100/73  Pulse: 73 70  Resp: 16 18  Temp: 98 F (36.7 C) 98 F (36.7 C)  SpO2: 92% 93%    Physical Exam: Cardiac:  RRR Lungs:  nonlabored Extremities:  Bilateral lower leg dressings dry and intact. Brisk DP Doppler signals. Motor and sensation in feet intact   CBC    Component Value Date/Time   WBC 10.1 01/26/2020 0315   RBC 2.91 (L) 01/26/2020 0315   HGB 8.1 (L) 01/26/2020 0315   HGB 10.6 (L) 04/10/2019 1047   HCT 25.5 (L) 01/26/2020 0315   HCT 31.9 (L) 04/10/2019 1047   PLT 232 01/26/2020 0315   PLT 308 04/10/2019 1047   MCV 87.6 01/26/2020 0315   MCV 91 04/10/2019 1047   MCV 92 10/23/2013 0803   MCH 27.8 01/26/2020 0315   MCHC 31.8 01/26/2020 0315   RDW 13.1 01/26/2020 0315   RDW 11.3 (L) 04/10/2019 1047   RDW 14.0 10/23/2013 0803   LYMPHSABS 2.6 01/24/2020 2200   LYMPHSABS 2.4 10/23/2013 0803   MONOABS 0.7 01/24/2020 2200   MONOABS 0.7 10/23/2013 0803   EOSABS 0.3 01/24/2020 2200   EOSABS 0.3 10/23/2013 0803   BASOSABS 0.0 01/24/2020 2200   BASOSABS 0.1 10/23/2013 0803    BMET    Component Value Date/Time   NA 137 01/26/2020 0315   NA 139 04/10/2019 1047   NA 140 10/23/2013 0803   K 3.4 (L) 01/26/2020 0315   K 4.6 10/23/2013 0803   CL 100 01/26/2020 0315   CL 107 10/23/2013 0803   CO2 28 01/26/2020 0315   CO2 28 10/23/2013 0803   GLUCOSE 133 (H) 01/26/2020 0315   GLUCOSE 87 10/23/2013 0803   BUN 14 01/26/2020 0315   BUN 19 04/10/2019 1047   BUN 6 (L) 10/23/2013 0803   CREATININE 1.15 (H) 01/26/2020 0315   CREATININE 0.66 07/24/2016 1125   CALCIUM 8.5 (L) 01/26/2020 0315   CALCIUM 8.8 10/23/2013 0803   GFRNONAA 46 (L) 01/26/2020 0315   GFRNONAA >60 10/23/2013 0803   GFRAA 53 (L) 01/26/2020 0315   GFRAA >60 10/23/2013 0803     Intake/Output Summary (Last 24 hours) at 01/26/2020  1526 Last data filed at 01/26/2020 1300 Gross per 24 hour  Intake 1008.89 ml  Output 500 ml  Net 508.89 ml    HOSPITAL MEDICATIONS Scheduled Meds: . Chlorhexidine Gluconate Cloth  6 each Topical Daily  . [START ON 01/27/2020] clopidogrel  75 mg Oral Daily  . flecainide  50 mg Oral BID  . FLUoxetine  20 mg Oral BID  . metoprolol succinate  25 mg Oral QHS  . pantoprazole  40 mg Oral Daily  . sodium chloride flush  3 mL Intravenous Q12H   Continuous Infusions: . ceFAZolin    . [START ON 01/27/2020] heparin     PRN Meds:.acetaminophen **OR** acetaminophen, ALPRAZolam, morphine injection, ondansetron **OR** ondansetron (ZOFRAN) IV, oxyCODONE  Assessment: Post-op bilateral 4 comp fasciotomies this morning. POD 1 bilateral BK popliteal thrombectomies. LEs well perfused  Plan: -Continue current care plan. Heparin infusion to resume at 0600 tomorrow   Wendi Maya, PA-C [facility] [phone] 01/26/2020  3:26 PM

## 2020-01-26 NOTE — H&P (View-Only) (Signed)
 Progress Note  Patient Name: Barbara Hill Date of Encounter: 01/26/2020  Primary Cardiologist:   Paula Ross, MD   Subjective   She denies pain or SOB.  No status post fasciotomy.    Inpatient Medications    Scheduled Meds: . Chlorhexidine Gluconate Cloth  6 each Topical Daily  . clopidogrel  75 mg Oral Daily  . flecainide  50 mg Oral BID  . FLUoxetine  20 mg Oral BID  . metoprolol succinate  25 mg Oral QHS  . pantoprazole  40 mg Oral Daily  . sodium chloride flush  3 mL Intravenous Q12H   Continuous Infusions: . ceFAZolin    . heparin 750 Units/hr (01/26/20 0000)  . lactated ringers 10 mL/hr at 01/26/20 0906   PRN Meds: acetaminophen **OR** acetaminophen, ALPRAZolam, morphine injection, ondansetron **OR** ondansetron (ZOFRAN) IV, oxyCODONE   Vital Signs    Vitals:   01/26/20 1040 01/26/20 1052 01/26/20 1107 01/26/20 1137  BP: 133/66 (!) 119/49 (!) 118/49 (!) 121/50  Pulse: 77 77 78 73  Resp: 18 18 18 16  Temp: 98.3 F (36.8 C)  98.1 F (36.7 C) 98 F (36.7 C)  TempSrc:    Oral  SpO2: 100% 100% 97% 92%  Weight:      Height:        Intake/Output Summary (Last 24 hours) at 01/26/2020 1220 Last data filed at 01/26/2020 1043 Gross per 24 hour  Intake 888.89 ml  Output 500 ml  Net 388.89 ml   Filed Weights   01/24/20 2115  Weight: 48.5 kg    Telemetry    NSR - Personally Reviewed  ECG    NA - Personally Reviewed  Physical Exam   GEN: No acute distress.   Neck: No  JVD Cardiac: RRR, no murmurs, rubs, or gallops.  Respiratory: Clear  to auscultation bilaterally. GI: Soft, nontender, non-distended  MS: No  edema; No deformity. Status post fasciotomy Neuro:  Nonfocal  Psych: Normal affect   Labs    Chemistry Recent Labs  Lab 01/24/20 2200 01/26/20 0315  NA 140 137  K 3.9 3.4*  CL 104 100  CO2 28 28  GLUCOSE 102* 133*  BUN 18 14  CREATININE 1.21* 1.15*  CALCIUM 8.8* 8.5*  GFRNONAA 43* 46*  GFRAA 50* 53*  ANIONGAP 8 9      Hematology Recent Labs  Lab 01/24/20 2200 01/26/20 0315  WBC 6.1 10.1  RBC 3.56* 2.91*  HGB 10.0* 8.1*  HCT 31.5* 25.5*  MCV 88.5 87.6  MCH 28.1 27.8  MCHC 31.7 31.8  RDW 12.9 13.1  PLT 243 232    Cardiac EnzymesNo results for input(s): TROPONINI in the last 168 hours. No results for input(s): TROPIPOC in the last 168 hours.   BNPNo results for input(s): BNP, PROBNP in the last 168 hours.   DDimer No results for input(s): DDIMER in the last 168 hours.   Radiology    CT Angio Aortobifemoral W and/or Wo Contrast  Result Date: 01/25/2020 CLINICAL DATA:  Right lower leg pain and decreased pulses EXAM: CT ANGIOGRAPHY OF ABDOMINAL AORTA WITH ILIOFEMORAL RUNOFF TECHNIQUE: Multidetector CT imaging of the abdomen, pelvis and lower extremities was performed using the standard protocol during bolus administration of intravenous contrast. Multiplanar CT image reconstructions and MIPs were obtained to evaluate the vascular anatomy. CONTRAST:  75mL OMNIPAQUE IOHEXOL 350 MG/ML SOLN COMPARISON:  None. FINDINGS: VASCULAR Aorta: Abdominal aorta demonstrates mild atherosclerotic calcifications without aneurysmal dilatation. Celiac: Patent without evidence of aneurysm, dissection,   vasculitis or significant stenosis. SMA: Patent without evidence of aneurysm, dissection, vasculitis or significant stenosis. Renals: Single renal arteries are identified bilaterally and widely patent. IMA: Patent without evidence of aneurysm, dissection, vasculitis or significant stenosis. RIGHT Lower Extremity Inflow: Common iliac, external iliac and internal iliac arteries are widely patent. No aneurysmal dilatation is seen. Runoff: Common femoral artery and femoral bifurcation are within normal limits. Profundus femoral artery is occluded shortly after its origin. The superficial femoral artery is within normal limits. Mild atherosclerotic calcifications are noted. The popliteal artery is patent proximally however occludes  just beyond the knee joint. There is reconstitution of the anterior tibial artery just beyond its origin. The distal anterior tibial artery is not well visualized due to timing of contrast bolus. The tibioperoneal trunk reconstitutes shortly after its origin with evidence of diffuse atherosclerotic calcifications. The peroneal artery is diminutive. The posterior tibial artery continues to the level of the foot. LEFT Lower Extremity Inflow: Common iliac artery, internal and external iliac arteries are patent. Runoff: Common femoral artery and femoral bifurcation are patent. Superficial femoral artery is widely patent with mild atherosclerotic calcifications. The popliteal artery is patent throughout its course with the exception of the distal most aspect where there is a filling defect identified suspicious for embolus. Anterior tibial artery and tibioperoneal trunk are visualized just beyond this filling defect and patent. The anterior tibial artery continues into the mid calf and is diminutive distally. Primary runoff is noted via the posterior tibial artery to the level of the ankle and subsequently into the foot. The peroneal artery is diminutive. Veins: No specific venous abnormality is noted. Review of the MIP images confirms the above findings. NON-VASCULAR Lower chest: Mild bibasilar atelectasis is noted right greater than left. Hepatobiliary: No focal liver abnormality is seen. Status post cholecystectomy. No biliary dilatation. Pancreas: Unremarkable. No pancreatic ductal dilatation or surrounding inflammatory changes. Spleen: Normal in size without focal abnormality. Adrenals/Urinary Tract: Adrenal glands are within normal limits bilaterally. Kidneys demonstrate a normal enhancement pattern. Bladder is partially distended. Stomach/Bowel: Diverticular change of the colon is noted without evidence of diverticulitis. No obstructive or inflammatory change of the bowel is seen. The appendix is not visualized  consistent with a prior surgical history. Lymphatic: No specific lymphadenopathy is noted. Reproductive: Uterus has been surgically removed. No adnexal mass is noted. Other: No abdominal wall hernia or abnormality. No abdominopelvic ascites. Musculoskeletal: No acute or significant osseous findings. IMPRESSION: VASCULAR Atherosclerotic calcifications are noted throughout the arterial tree. Short segment occlusion of the right distal popliteal artery is noted with reconstitution of the anterior tibial artery and tibioperoneal trunk as described above. Dominant runoff is noted via the posterior tibial artery. Short segment occlusion versus high-grade stenosis of the left popliteal artery distally which appears to be related to a focal embolus. Reconstitution of the anterior tibial and tibioperoneal trunk is seen with dominant runoff via the posterior tibial artery. NON-VASCULAR Diverticular change without diverticulitis. Basilar atelectasis right greater than left. No acute abnormality noted. Electronically Signed   By: Alcide Clever M.D.   On: 01/25/2020 02:59   ECHOCARDIOGRAM COMPLETE  Result Date: 01/25/2020    ECHOCARDIOGRAM REPORT   Patient Name:   LEVORA WERDEN Date of Exam: 01/25/2020 Medical Rec #:  [id number]      Height:       63.0 in Accession #:    [id number]     Weight:       107.0 lb Date of Birth:  06/25/1941  BSA:          1.482 m Patient Age:    80 years       BP:           112/50 mmHg Patient Gender: F              HR:           66 bpm. Exam Location:  Inpatient Procedure: 2D Echo, Pediatric Echo and Cardiac Doppler Indications:    Embolism  History:        Patient has prior history of Echocardiogram examinations, most                 recent 04/01/2016. COPD and Stroke, Arrythmias:Atrial Fibrillation                 and SVT; Risk Factors:Hypertension and Current Smoker. S/p                 Popliteal surgery this.  Sonographer:    Dustin Flock Referring Phys: [phone] [place]   1. Left ventricular ejection fraction, by estimation, is 60 to 65%. The left ventricle has normal function. The left ventricle has no regional wall motion abnormalities. There is mild left ventricular hypertrophy. Left ventricular diastolic parameters are consistent with Grade II diastolic dysfunction (pseudonormalization).  2. Right ventricular systolic function is normal. The right ventricular size is normal. There is normal pulmonary artery systolic pressure.  3. The mitral valve is normal in structure. Trivial mitral valve regurgitation. No evidence of mitral stenosis.  4. The aortic valve is normal in structure. Aortic valve regurgitation is mild. No aortic stenosis is present. FINDINGS  Left Ventricle: Left ventricular ejection fraction, by estimation, is 60 to 65%. The left ventricle has normal function. The left ventricle has no regional wall motion abnormalities. The left ventricular internal cavity size was normal in size. There is  mild left ventricular hypertrophy. Left ventricular diastolic parameters are consistent with Grade II diastolic dysfunction (pseudonormalization). Right Ventricle: The right ventricular size is normal. No increase in right ventricular wall thickness. Right ventricular systolic function is normal. There is normal pulmonary artery systolic pressure. The tricuspid regurgitant velocity is 2.70 m/s, and  with an assumed right atrial pressure of 3 mmHg, the estimated right ventricular systolic pressure is 73.4 mmHg. Left Atrium: Left atrial size was normal in size. Right Atrium: Right atrial size was normal in size. Pericardium: Trivial pericardial effusion is present. Mitral Valve: The mitral valve is normal in structure. Trivial mitral valve regurgitation. No evidence of mitral valve stenosis. Tricuspid Valve: The tricuspid valve is normal in structure. Tricuspid valve regurgitation is trivial. Aortic Valve: The aortic valve is normal in structure. Aortic valve regurgitation is  mild. Aortic regurgitation PHT measures 605 msec. No aortic stenosis is present. Pulmonic Valve: The pulmonic valve was normal in structure. Pulmonic valve regurgitation is trivial. No evidence of pulmonic stenosis. Aorta: The aortic root and ascending aorta are structurally normal, with no evidence of dilitation. IAS/Shunts: The atrial septum is grossly normal.  LEFT VENTRICLE PLAX 2D LVIDd:         3.50 cm  Diastology LVIDs:         2.40 cm  LV e' lateral:   5.87 cm/s LV PW:         1.20 cm  LV E/e' lateral: 9.4 LV IVS:        1.30 cm  LV e' medial:    5.55 cm/s LVOT diam:  2.00 cm  LV E/e' medial:  10.0 LV SV:         71 LV SV Index:   48 LVOT Area:     3.14 cm  RIGHT VENTRICLE RV Basal diam:  2.40 cm RV S prime:     6.09 cm/s TAPSE (M-mode): 2.5 cm LEFT ATRIUM             Index       RIGHT ATRIUM           Index LA diam:        3.30 cm 2.23 cm/m  RA Area:     13.30 cm LA Vol (A2C):   43.2 ml 29.14 ml/m RA Volume:   35.00 ml  23.61 ml/m LA Vol (A4C):   55.1 ml 37.17 ml/m LA Biplane Vol: 50.6 ml 34.14 ml/m  AORTIC VALVE LVOT Vmax:   95.30 cm/s LVOT Vmean:  68.700 cm/s LVOT VTI:    0.227 m AI PHT:      605 msec  AORTA Ao Root diam: 3.20 cm MITRAL VALVE               TRICUSPID VALVE MV Area (PHT): 2.42 cm    TR Peak grad:   29.2 mmHg MV Decel Time: 313 msec    TR Vmax:        270.00 cm/s MV E velocity: 55.30 cm/s MV A velocity: 62.60 cm/s  SHUNTS MV E/A ratio:  0.88        Systemic VTI:  0.23 m                            Systemic Diam: 2.00 cm Kristeen Miss MD Electronically signed by Kristeen Miss MD Signature Date/Time: 01/25/2020/2:23:38 PM    Final     Cardiac Studies   ECHO  1. Left ventricular ejection fraction, by estimation, is 60 to 65%. The  left ventricle has normal function. The left ventricle has no regional  wall motion abnormalities. There is mild left ventricular hypertrophy.  Left ventricular diastolic parameters  are consistent with Grade II diastolic dysfunction  (pseudonormalization).  2. Right ventricular systolic function is normal. The right ventricular  size is normal. There is normal pulmonary artery systolic pressure.  3. The mitral valve is normal in structure. Trivial mitral valve  regurgitation. No evidence of mitral stenosis.  4. The aortic valve is normal in structure. Aortic valve regurgitation is  mild. No aortic stenosis is present.   Patient Profile     79 y.o. 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 Popliteal exploration and embolectomy who is being seen for the evaluation of anticoagualtion at the request of Dr. Arbie Cookey .  She had urgent fasciotomy today.   Assessment & Plan    BILATERAL POPLITEAL EMBOLI:   Resume anticoagulation when OK with surgery.  Will need DOAC. I will reschedule the TEE until Monday.   SVT:  No sustained arrhythmias.  No evidence of atrial fib.    For questions or updates, please contact [facility] Please consult [email or web address] for contact info under Cardiology/STEMI.   Signed, Rollene Rotunda, MD  01/26/2020, 12:20 PM

## 2020-01-27 LAB — COMPREHENSIVE METABOLIC PANEL
ALT: 6 U/L (ref 0–44)
AST: 17 U/L (ref 15–41)
Albumin: 2.5 g/dL — ABNORMAL LOW (ref 3.5–5.0)
Alkaline Phosphatase: 49 U/L (ref 38–126)
Anion gap: 11 (ref 5–15)
BUN: 15 mg/dL (ref 8–23)
CO2: 25 mmol/L (ref 22–32)
Calcium: 8.5 mg/dL — ABNORMAL LOW (ref 8.9–10.3)
Chloride: 100 mmol/L (ref 98–111)
Creatinine, Ser: 1.08 mg/dL — ABNORMAL HIGH (ref 0.44–1.00)
GFR calc Af Amer: 57 mL/min — ABNORMAL LOW (ref >60)
GFR calc non Af Amer: 49 mL/min — ABNORMAL LOW (ref >60)
Glucose, Bld: 109 mg/dL — ABNORMAL HIGH (ref 70–99)
Potassium: 3.3 mmol/L — ABNORMAL LOW (ref 3.5–5.1)
Sodium: 136 mmol/L (ref 135–145)
Total Bilirubin: 0.6 mg/dL (ref 0.3–1.2)
Total Protein: 5.6 g/dL — ABNORMAL LOW (ref 6.5–8.1)

## 2020-01-27 LAB — CBC
HCT: 21.7 % — ABNORMAL LOW (ref 36.0–46.0)
Hemoglobin: 7 g/dL — ABNORMAL LOW (ref 12.0–15.0)
MCH: 28.7 pg (ref 26.0–34.0)
MCHC: 32.3 g/dL (ref 30.0–36.0)
MCV: 88.9 fL (ref 80.0–100.0)
Platelets: 192 10*3/uL (ref 150–400)
RBC: 2.44 MIL/uL — ABNORMAL LOW (ref 3.87–5.11)
RDW: 13.2 % (ref 11.5–15.5)
WBC: 11.1 10*3/uL — ABNORMAL HIGH (ref 4.0–10.5)
nRBC: 0 % (ref 0.0–0.2)

## 2020-01-27 LAB — HEPARIN LEVEL (UNFRACTIONATED)
Heparin Unfractionated: 0.23 IU/mL — ABNORMAL LOW (ref 0.30–0.70)
Heparin Unfractionated: 0.38 IU/mL (ref 0.30–0.70)

## 2020-01-27 MED ORDER — POTASSIUM CHLORIDE CRYS ER 20 MEQ PO TBCR
40.0000 meq | EXTENDED_RELEASE_TABLET | Freq: Once | ORAL | Status: AC
  Administered 2020-01-27: 40 meq via ORAL
  Filled 2020-01-27: qty 2

## 2020-01-27 MED ORDER — HEPARIN BOLUS VIA INFUSION
700.0000 [IU] | Freq: Once | INTRAVENOUS | Status: AC
  Administered 2020-01-27: 700 [IU] via INTRAVENOUS
  Filled 2020-01-27: qty 700

## 2020-01-27 NOTE — Progress Notes (Signed)
PROGRESS NOTE    Barbara Hill  MVE:[id number] DOB: 1941-01-10 DOA: 01/24/2020 PCP: Richmond Campbell., PA-C    Brief Narrative:  79 y.o. 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  History is obtained from the patient, but she seems intermittently confused and is not aware of the date and ask for her daughter who is not been by per nursing staff.  Her right leg pain started acutely and was severe.  A couple days prior she says that her heart was beating very fast for which EMS was called, but by their arrival was beating normally.  She had also had long-term Holter monitoring performed in March of this year which showed no significant arrhythmias.  She does admit to still smoking cigarettes, but reports that she will quit as she will related with the pain that she had in her right leg that brought her to the hospital.  ED Course: Upon admission into the emergency department patient was noted to be afebrile with a pressure elevated up to 217/84, heart rates intermittently fluctuating from 60-130, and all other vital signs maintained.  Labs are significant for hemoglobin 10, BUN 18, and creatinine 1.21.  CT scan of the abdomen pelvis revealed bilateral popliteal embolus.  Patient was taken immediately to the operating grading room by vascular surgery for bilateral popliteal embolectomy.  Assessment & Plan:   Principal Problem:   Ischemic leg Active Problems:   Paroxysmal SVT (supraventricular tachycardia) (HCC)   Tobacco use disorder   COPD (chronic obstructive pulmonary disease) (HCC)   Normocytic anemia   History of CVA (cerebrovascular accident)   Anxiety   CKD (chronic kidney disease), stage III  Bilateral ischemic legs: Acute.   -Pt with concerns of bilateral popliteal embolic  disease -Vascular Surgery consulted and patient underwent B popliteal exploration and embolectomy on 6/3 -Post-operatively, pt complained of increased LE pain and on re-evaluation by Vascular Surgery, decision made for 4 compartment fasciotomies, performed on 6/4 -per Vascular, OK to get out of bed  -Continued on heparin at this time. Decision on oral anticoagulation to be made in the next few days if no bleeding  Paroxysmal SVT:  -On admission patient appeared to be in sinus rhythm.   -Cont with home flecainide 50mg  twice daily -Continue on heparin gtt per above  COPD/tobacco use:  -Patient without signs of acute exacerbation of her COPD.   -Reportedly declined nicotine patch on presentation -cont with Albuterol nebs as needed shortness of breath/wheezing -cont to recommend tobacco cessation  Memory deficit:  -Patient noted issues with memory and possibly confused at times.   -Unclear if this is related with previous stroke or new.  -This AM, seems oriented to place and seems to have insight to her current medical situation  Normocytic anemia:  -Hemoglobin currently 7.0 -Continue to monitor closely while on anticoagulation  History of CVA:  -Patient with previous history of stroke following procedure in 2017. -Seems to be stable at this time  Essential hypertension:  -Home medications include losartan 25 mg daily and metoprolol 25 mg nightly -Continued on metoprolol -BP soft this afternoon, since improved. Will cont to monitor  Chronic kidney disease stage IIIb: Patient presents with creatinine of 1.21 which appears within her baseline of 1.2-1.3. -recheck bmet in AM  Anxiety: Continue Xanax 0.5 mg 3 times daily as  needed and fluoxetine 20 mg twice daily -Continue Xanax and fluoxetine as pt tolerates  GERD: Home meds include omeprazole 20 mg daily -Continue with protonix as tolerated  DVT prophylaxis: Heparin gtt Code Status: Full Family Communication: Pt in  room, family not at bedside  Status is: Inpatient  Remains inpatient appropriate because:Hemodynamically unstable, Ongoing diagnostic testing needed not appropriate for outpatient work up, IV treatments appropriate due to intensity of illness or inability to take PO and Inpatient level of care appropriate due to severity of illness   Dispo: The patient is from: Home              Anticipated d/c is to: CIR              Anticipated d/c date is: > 3 days, when cleared by Vascular              Patient currently is not medically stable to d/c.       Consultants:   Vascular Surgery  Procedures:  B popliteal exploration and embolectomy on 6/3 Emergent 4 compartment fasciotomy 6/4  Antimicrobials: Anti-infectives (From admission, onward)   Start     Dose/Rate Route Frequency Ordered Stop   01/26/20 0918  ceFAZolin (ANCEF) 2-4 GM/100ML-% IVPB    Note to Pharmacy: Evern Bio   : cabinet override      01/26/20 0918 01/26/20 2129      Subjective: Complaining of continued LE pain, worse with movement, less so while at rest  Objective: Vitals:   01/26/20 1930 01/26/20 2300 01/27/20 0343 01/27/20 0811  BP: (!) 129/54 (!) 112/54 102/61 (!) 117/57  Pulse: 79 75 75 73  Resp: 19 20 18 16   Temp: (!) 100.4 F (38 C) 99.7 F (37.6 C) 99.1 F (37.3 C) 99.4 F (37.4 C)  TempSrc: Oral Oral Oral Oral  SpO2: 91% 92% 93% 96%  Weight:      Height:        Intake/Output Summary (Last 24 hours) at 01/27/2020 1341 Last data filed at 01/27/2020 0547 Gross per 24 hour  Intake --  Output 480 ml  Net -480 ml   Filed Weights   01/24/20 2115  Weight: 48.5 kg    Examination: General exam: Awake, laying in bed, in nad Respiratory system: Normal respiratory effort, no wheezing Cardiovascular system: regular rate, s1, s2 Gastrointestinal system: Soft, nondistended, positive BS Central nervous system: CN2-12 grossly intact, strength intact Extremities: Perfused, no clubbing, BLE lateral  and medial calf incisions which are stapled  Skin: Normal skin turgor, no notable skin lesions seen Psychiatry: Mood normal // no visual hallucinations   Data Reviewed: I have personally reviewed following labs and imaging studies  CBC: Recent Labs  Lab 01/24/20 2200 01/26/20 0315 01/27/20 0304  WBC 6.1 10.1 11.1*  NEUTROABS 2.5  --   --   HGB 10.0* 8.1* 7.0*  HCT 31.5* 25.5* 21.7*  MCV 88.5 87.6 88.9  PLT 243 232 192   Basic Metabolic Panel: Recent Labs  Lab 01/24/20 2200 01/26/20 0315 01/27/20 0304  NA 140 137 136  K 3.9 3.4* 3.3*  CL 104 100 100  CO2 28 28 25   GLUCOSE 102* 133* 109*  BUN 18 14 15   CREATININE 1.21* 1.15* 1.08*  CALCIUM 8.8* 8.5* 8.5*   GFR: Estimated Creatinine Clearance: 33.4 mL/min (A) (by C-G formula based on SCr of 1.08 mg/dL (H)). Liver Function Tests: Recent Labs  Lab 01/27/20 0304  AST 17  ALT 6  ALKPHOS 49  BILITOT  0.6  PROT 5.6*  ALBUMIN 2.5*   No results for input(s): LIPASE, AMYLASE in the last 168 hours. No results for input(s): AMMONIA in the last 168 hours. Coagulation Profile: No results for input(s): INR, PROTIME in the last 168 hours. Cardiac Enzymes: No results for input(s): CKTOTAL, CKMB, CKMBINDEX, TROPONINI in the last 168 hours. BNP (last 3 results) No results for input(s): PROBNP in the last 8760 hours. HbA1C: No results for input(s): HGBA1C in the last 72 hours. CBG: No results for input(s): GLUCAP in the last 168 hours. Lipid Profile: No results for input(s): CHOL, HDL, LDLCALC, TRIG, CHOLHDL, LDLDIRECT in the last 72 hours. Thyroid Function Tests: Recent Labs    01/25/20 1947  TSH 0.372   Anemia Panel: No results for input(s): VITAMINB12, FOLATE, FERRITIN, TIBC, IRON, RETICCTPCT in the last 72 hours. Sepsis Labs: No results for input(s): PROCALCITON, LATICACIDVEN in the last 168 hours.  Recent Results (from the past 240 hour(s))  SARS Coronavirus 2 by RT PCR (hospital order, performed in [facility]  hospital lab) Nasopharyngeal Nasopharyngeal Swab     Status: None   Collection Time: 01/25/20  4:23 AM   Specimen: Nasopharyngeal Swab  Result Value Ref Range Status   SARS Coronavirus 2 NEGATIVE NEGATIVE Final    Comment: (NOTE) SARS-CoV-2 target nucleic acids are NOT DETECTED. The SARS-CoV-2 RNA is generally detectable in upper and lower respiratory specimens during the acute phase of infection. The lowest concentration of SARS-CoV-2 viral copies this assay can detect is 250 copies / mL. A negative result does not preclude SARS-CoV-2 infection and should not be used as the sole basis for treatment or other patient management decisions.  A negative result may occur with improper specimen collection / handling, submission of specimen other than nasopharyngeal swab, presence of viral mutation(s) within the areas targeted by this assay, and inadequate number of viral copies (<250 copies / mL). A negative result must be combined with clinical observations, patient history, and epidemiological information. Fact Sheet for Patients:   StrictlyIdeas.no Fact Sheet for Healthcare Providers: BankingDealers.co.za This test is not yet approved or cleared  by the [place] FDA and has been authorized for detection and/or diagnosis of SARS-CoV-2 by FDA under an Emergency Use Authorization (EUA).  This EUA will remain in effect (meaning this test can be used) for the duration of the COVID-19 declaration under Section 564(b)(1) of the Act, 21 U.S.C. section 360bbb-3(b)(1), unless the authorization is terminated or revoked sooner. Performed at [facility], [address], [place], [place] [address]      Radiology Studies: No results found.  Scheduled Meds: . Chlorhexidine Gluconate Cloth  6 each Topical Daily  . clopidogrel  75 mg Oral Daily  . flecainide  50 mg Oral BID  . FLUoxetine  20 mg Oral BID  . heparin  700 Units Intravenous  Once  . metoprolol succinate  25 mg Oral QHS  . pantoprazole  40 mg Oral Daily  . sodium chloride flush  3 mL Intravenous Q12H   Continuous Infusions: . heparin 750 Units/hr (01/27/20 0540)     LOS: 2 days   Marylu Lund, MD [facility] Pager On Amion  If 7PM-7AM, please contact night-coverage 01/27/2020, 1:41 PM

## 2020-01-27 NOTE — Progress Notes (Addendum)
Progress Note    01/27/2020 8:24 AM 1 Day Post-Op  Subjective: Only complaint this morning is bilateral lower leg pain with movement.  She denies chest pain or shortness of breath.  Tolerating diet   Vitals:   01/27/20 0343 01/27/20 0811  BP: 102/61 (!) 117/57  Pulse: 75 73  Resp: 18 16  Temp: 99.1 F (37.3 C) 99.4 F (37.4 C)  SpO2: 93% 96%    Physical Exam: Cardiac: Heart rate and rhythm are regular Lungs: There to auscultation bilaterally Incisions: Bilateral lower leg incisions well approximated.  Ecchymosis of skin margins of the right medial incision.  Tiny amount of oozing from left medial incision.  There is edema of the area or aspects of bilateral medial incisions.  These areas are soft and not tense Extremities: Motor function and sensation are intact of both lower extremities.  She has brisk dorsalis pedis pulses bilaterally. Abdomen: Soft, nondistended, positive bowel sounds  CBC    Component Value Date/Time   WBC 11.1 (H) 01/27/2020 0304   RBC 2.44 (L) 01/27/2020 0304   HGB 7.0 (L) 01/27/2020 0304   HGB 10.6 (L) 04/10/2019 1047   HCT 21.7 (L) 01/27/2020 0304   HCT 31.9 (L) 04/10/2019 1047   PLT 192 01/27/2020 0304   PLT 308 04/10/2019 1047   MCV 88.9 01/27/2020 0304   MCV 91 04/10/2019 1047   MCV 92 10/23/2013 0803   MCH 28.7 01/27/2020 0304   MCHC 32.3 01/27/2020 0304   RDW 13.2 01/27/2020 0304   RDW 11.3 (L) 04/10/2019 1047   RDW 14.0 10/23/2013 0803   LYMPHSABS 2.6 01/24/2020 2200   LYMPHSABS 2.4 10/23/2013 0803   MONOABS 0.7 01/24/2020 2200   MONOABS 0.7 10/23/2013 0803   EOSABS 0.3 01/24/2020 2200   EOSABS 0.3 10/23/2013 0803   BASOSABS 0.0 01/24/2020 2200   BASOSABS 0.1 10/23/2013 0803    BMET    Component Value Date/Time   NA 136 01/27/2020 0304   NA 139 04/10/2019 1047   NA 140 10/23/2013 0803   K 3.3 (L) 01/27/2020 0304   K 4.6 10/23/2013 0803   CL 100 01/27/2020 0304   CL 107 10/23/2013 0803   CO2 25 01/27/2020 0304   CO2 28  10/23/2013 0803   GLUCOSE 109 (H) 01/27/2020 0304   GLUCOSE 87 10/23/2013 0803   BUN 15 01/27/2020 0304   BUN 19 04/10/2019 1047   BUN 6 (L) 10/23/2013 0803   CREATININE 1.08 (H) 01/27/2020 0304   CREATININE 0.66 07/24/2016 1125   CALCIUM 8.5 (L) 01/27/2020 0304   CALCIUM 8.8 10/23/2013 0803   GFRNONAA 49 (L) 01/27/2020 0304   GFRNONAA >60 10/23/2013 0803   GFRAA 57 (L) 01/27/2020 0304   GFRAA >60 10/23/2013 0803     Intake/Output Summary (Last 24 hours) at 01/27/2020 0824 Last data filed at 01/27/2020 0547 Gross per 24 hour  Intake 920 ml  Output 980 ml  Net -60 ml    HOSPITAL MEDICATIONS Scheduled Meds: . Chlorhexidine Gluconate Cloth  6 each Topical Daily  . clopidogrel  75 mg Oral Daily  . flecainide  50 mg Oral BID  . FLUoxetine  20 mg Oral BID  . metoprolol succinate  25 mg Oral QHS  . pantoprazole  40 mg Oral Daily  . sodium chloride flush  3 mL Intravenous Q12H   Continuous Infusions: . heparin 750 Units/hr (01/27/20 0540)   PRN Meds:.acetaminophen **OR** acetaminophen, ALPRAZolam, morphine injection, ondansetron **OR** ondansetron (ZOFRAN) IV, oxyCODONE  Assessment: Patient presented  with acute ischemic lower extremities during bilateral popliteal exploration and embolectomy.  Approximately 24 hours later she complained of acute bilateral lower leg pain consistent with compartment syndrome.  Returned to the OR urgently for bilateral 4 compartment fasciotomy.  Heparin restarted this morning at approximately 0540.  Lower extremities well perfused and bilateral calves are soft.  Plan: -Continue heparin infusion.  Plavix currently held. -Cardiology consultation obtained.  TEE rescheduled for Monday.  The patient will need DOAC. -DVT prophylaxis:  Heparin infusion   Risa Grill, PA-C [facility] [phone] 01/27/2020  8:24 AM   Agree with above.  Has pain around ankles.  Compartments soft.  Small fasciotomy hematomas both legs at inferior  aspect 3 x 3 cm.  2+ right DP doppler left foot  Try to get out of bed again today.  Suspect pain is secondary to prolonged ischemia hopefully recover over time.  Continue heparin.  Will decide on oral agent next few days if no bleeding  Ruta Hinds, MD [facility] [place] Office: [phone]

## 2020-01-27 NOTE — Evaluation (Signed)
Physical Therapy Evaluation Patient Details Name: KELLYE MIZNER MRN: [id number] DOB: 01/01/1941 Today's Date: 01/27/2020   History of Present Illness  Patient is a 79 y/o female who presented with acute ischemic lower extremities now s/p bilateral popliteal exploration and embolectomy 01/25/20. 24 hours later developed compartment syndrome and went for bilateral 4 compartment fasciotomy 01/26/20.  Has some post op confusion.  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  Clinical Impression  Patient presents with decreased mobility secondary to pain, limited activity tolerance with BP drop in sitting to 86/47 and nausea, decreased balance, decreased ROM/strength in LE's and she will benefit from skilled PT in the acute setting to allow eventual return home with family support.  Currently mod A for EOB activity only with significant increased time.  She will likely need CIR level rehab prior to home.  PT to follow.     Follow Up Recommendations CIR;Supervision/Assistance - 24 hour    Equipment Recommendations  Rolling walker with 5" wheels    Recommendations for Other Services Rehab consult     Precautions / Restrictions Precautions Precautions: Fall      Mobility  Bed Mobility Overal bed mobility: Needs Assistance Bed Mobility: Supine to Sit;Sit to Supine     Supine to sit: Mod assist;HOB elevated Sit to supine: Mod assist   General bed mobility comments: increased time needed and assist for LE's and lifting trunk; to supine assist for LE's and to reposition shoulders once supine +2 A for scooting to [place] transfer comment: NT due to nausea, hypotension sitting EOB  Ambulation/Gait                Stairs            Wheelchair Mobility    Modified Rankin (Stroke Patients Only)       Balance Overall balance assessment: Needs assistance Sitting-balance  support: Feet supported;Feet unsupported;Single extremity supported Sitting balance-Leahy Scale: Poor Sitting balance - Comments: leaning down on R elbow at times and cues to keep feet on the floor min A at times for balance                                     Pertinent Vitals/Pain Pain Assessment: 0-10 Pain Score: 7  Pain Location: bilateral LE's Pain Descriptors / Indicators: Aching;Grimacing;Discomfort Pain Intervention(s): Monitored during session;Limited activity within patient's tolerance    Home Living Family/patient expects to be discharged to:: Private residence Living Arrangements: Spouse/significant other;Children Available Help at Discharge: Family;Available 24 hours/day Type of Home: House Home Access: Stairs to enter Entrance Stairs-Rails: None Entrance Stairs-Number of Steps: 3 Home Layout: One level Home Equipment: Walker - 2 wheels;Shower seat Additional Comments: information from prior admission as pt distracted by pain and confused about overnight events; reports spouse at home on [facility] and daughter, [name], lives next door    Prior Function Level of Independence: Independent               Hand Dominance        Extremity/Trunk Assessment   Upper Extremity Assessment Upper Extremity Assessment: Generalized weakness    Lower Extremity Assessment Lower Extremity Assessment: LLE deficits/detail;RLE deficits/detail RLE Deficits / Details: AAROM ankle DF limited by pain about 20 degrees lacking, knee  flexion in supine AAROM about 50, at EOB about 70, incisions around lower leg oozing bloody drainage in some areas LLE Deficits / Details: AAROM ankle DF limited by pain about 20 degrees lacking, knee flexion in supine AAROM about 50, at EOB about 70, incisions around lower leg oozing bloody drainage in some areas       Communication   Communication: No difficulties  Cognition Arousal/Alertness: Awake/alert Behavior During Therapy:  Anxious Overall Cognitive Status: Impaired/Different from baseline Area of Impairment: Attention;Following commands;Problem solving;Memory;Orientation                 Orientation Level: Time;Disoriented to Current Attention Level: Focused Memory: Decreased short-term memory Following Commands: Follows one step commands inconsistently;Follows one step commands with increased time     Problem Solving: Slow processing;Difficulty sequencing;Requires verbal cues;Requires tactile cues General Comments: Reports got up and cooked last night for a bunch of people she doesn't know and that several kids were running around,  Assisted her to call her daughter and spouse who helped with reorientation.      General Comments General comments (skin integrity, edema, etc.): blood oozing from R lower leg so dressing with gauze and wrap, then noted on outside of L leg so wrapped with gauze and kerlix    Exercises Other Exercises Other Exercises: AAROM ankles and knees in supine x 5-10 reps   Assessment/Plan    PT Assessment Patient needs continued PT services  PT Problem List Decreased strength;Decreased mobility;Pain;Decreased balance;Decreased knowledge of use of DME;Decreased activity tolerance;Decreased safety awareness       PT Treatment Interventions DME instruction;Therapeutic activities;Gait training;Therapeutic exercise;Patient/family education;Balance training;Functional mobility training;Stair training    PT Goals (Current goals can be found in the Care Plan section)  Acute Rehab PT Goals Patient Stated Goal: to go home PT Goal Formulation: With patient/family Time For Goal Achievement: 02/10/20 Potential to Achieve Goals: Good    Frequency Min 3X/week   Barriers to discharge        Co-evaluation               AM-PAC PT "6 Clicks" Mobility  Outcome Measure Help needed turning from your back to your side while in a flat bed without using bedrails?: A Lot Help  needed moving from lying on your back to sitting on the side of a flat bed without using bedrails?: A Lot Help needed moving to and from a bed to a chair (including a wheelchair)?: Total Help needed standing up from a chair using your arms (e.g., wheelchair or bedside chair)?: Total Help needed to walk in hospital room?: Total Help needed climbing 3-5 steps with a railing? : Total 6 Click Score: 8    End of Session   Activity Tolerance: Treatment limited secondary to medical complications (Comment) Patient left: in bed;with call bell/phone within reach;with bed alarm set Nurse Communication: Mobility status PT Visit Diagnosis: Other abnormalities of gait and mobility (R26.89);Muscle weakness (generalized) (M62.81);Difficulty in walking, not elsewhere classified (R26.2);Pain Pain - Right/Left: Right(both) Pain - part of body: Leg    Time: 7829-5621 PT Time Calculation (min) (ACUTE ONLY): 44 min   Charges:   PT Evaluation $PT Eval Moderate Complexity: 1 Mod PT Treatments $Therapeutic Activity: 23-37 mins        Sheran Lawless, [facility] [phone] 01/27/2020   Elray Mcgregor 01/27/2020, 11:15 AM

## 2020-01-27 NOTE — Progress Notes (Signed)
Inpatient Rehab Admissions Coordinator Note:   Per PT recommendation, pt was screened for CIR candidacy by Wolfgang Phoenix, MS, CCC-SLP.  At this time we are recommending an Inpatient Rehab consult.  AC will place order per protocol.  Please contact me with questions.    Wolfgang Phoenix, MS, CCC-SLP Admissions Coordinator [phone]

## 2020-01-27 NOTE — Progress Notes (Signed)
Inpatient Rehab Admissions:  Inpatient Rehab Consult received.  I met with patient at the bedside for rehabilitation assessment and to discuss goals and expectations of an inpatient rehab admission.  Pt acknowledged understanding of CIR goals and expectations.  Given permission to call pt's daughter, [name].  Spoke with [name] who also is interested in CIR and acknowledged understanding of goals and expectations.  Pt appears to be an appropriate candidate for CIR.  Will continue to follow pt's progress with acute therapies and monitor medical workup.  Signed: Gayland Curry, [place], [place] Admissions Coordinator [phone]

## 2020-01-27 NOTE — Progress Notes (Signed)
ANTICOAGULATION CONSULT NOTE - Follow Up Consult  Pharmacy Consult for Heparin Indication: s/p BL popliteal embolectomy  Allergies  Allergen Reactions  . Dobutamine Other (See Comments)    Heart beating hard with CP, neck pain, and weakness  . Epinephrine Other (See Comments)    Fast heart beat  . Darvocet [Propoxyphene N-Acetaminophen] Nausea And Vomiting  . Excedrin Extra Strength [Aspirin-Acetaminophen-Caffeine] Nausea And Vomiting  . Aspirin   . Clarithromycin Other (See Comments)    Yeast infection  . Doxycycline Other (See Comments)    Makes stomach hurt  . Penicillins Other (See Comments)    Yeast infection  Has patient had a PCN reaction causing immediate rash, facial/tongue/throat swelling, SOB or lightheadedness with hypotension: No Has patient had a PCN reaction causing severe rash involving mucus membranes or skin necrosis: No Has patient had a PCN reaction that required hospitalization No Has patient had a PCN reaction occurring within the last 10 years: No If all of the above answers are "NO", then may proceed with Cephalosporin use.   . Prednisone Other (See Comments)    Shakes   . Propoxyphene Nausea And Vomiting    Patient Measurements: Height: 5\' 3"  (160 cm) Weight: 48.5 kg (107 lb) IBW/kg (Calculated) : 52.4 Heparin Dosing Weight:  48.5 kg  Vital Signs: Temp: 99.1 F (37.3 C) (06/05 1701) Temp Source: Oral (06/05 1701) BP: 126/45 (06/05 1701) Pulse Rate: 68 (06/05 1448)  Labs: Recent Labs    01/24/20 2200 01/25/20 1947 01/26/20 0315 01/27/20 0304 01/27/20 1152 01/27/20 2100  HGB 10.0*  --  8.1* 7.0*  --   --   HCT 31.5*  --  25.5* 21.7*  --   --   PLT 243  --  232 192  --   --   HEPARINUNFRC  --    < > 0.64  --  0.23* 0.38  CREATININE 1.21*  --  1.15* 1.08*  --   --    < > = values in this interval not displayed.    Estimated Creatinine Clearance: 33.4 mL/min (A) (by C-G formula based on SCr of 1.08 mg/dL (H)).  Assessment: 78 yof  on heparin for bilateral cardiogenic emboli now s/p bilateral embolectomy 6/4. No AC PTA.   Heparin was restarted this morning at 0600 and 6 hour heparin level this afternoon is subtherapeutic at 0.23. hemoglobin is decreasing down to 7 today, platelets wnl at 192. Some ecchymosis and oozing noted.   6/5 AM update: Heparin level therapeutic after rate increase   Goal of Therapy:  Heparin level 0.3-0.7 units/ml Monitor platelets by anticoagulation protocol: Yes   Plan:  Cont heparin at 850 units/hr Confirmatory heparin level with AM labs  8/4, PharmD, BCPS Clinical Pharmacist Phone: [phone]

## 2020-01-27 NOTE — Progress Notes (Signed)
ANTICOAGULATION CONSULT NOTE - Follow Up Consult  Pharmacy Consult for Heparin Indication: s/p BL popliteal embolectomy  Allergies  Allergen Reactions   Dobutamine Other (See Comments)    Heart beating hard with CP, neck pain, and weakness   Epinephrine Other (See Comments)    Fast heart beat   Darvocet [Propoxyphene N-Acetaminophen] Nausea And Vomiting   Excedrin Extra Strength [Aspirin-Acetaminophen-Caffeine] Nausea And Vomiting   Aspirin    Clarithromycin Other (See Comments)    Yeast infection   Doxycycline Other (See Comments)    Makes stomach hurt   Penicillins Other (See Comments)    Yeast infection  Has patient had a PCN reaction causing immediate rash, facial/tongue/throat swelling, SOB or lightheadedness with hypotension: No Has patient had a PCN reaction causing severe rash involving mucus membranes or skin necrosis: No Has patient had a PCN reaction that required hospitalization No Has patient had a PCN reaction occurring within the last 10 years: No If all of the above answers are "NO", then may proceed with Cephalosporin use.    Prednisone Other (See Comments)    Shakes    Propoxyphene Nausea And Vomiting    Patient Measurements: Height: 5\' 3"  (160 cm) Weight: 48.5 kg (107 lb) IBW/kg (Calculated) : 52.4 Heparin Dosing Weight:  48.5 kg  Vital Signs: Temp: 99.4 F (37.4 C) (06/05 0811) Temp Source: Oral (06/05 0811) BP: 117/57 (06/05 0811) Pulse Rate: 73 (06/05 0811)  Labs: Recent Labs    01/24/20 2200 01/24/20 2200 01/25/20 1947 01/26/20 0315 01/27/20 0304  HGB 10.0*   < >  --  8.1* 7.0*  HCT 31.5*  --   --  25.5* 21.7*  PLT 243  --   --  232 192  HEPARINUNFRC  --   --  0.62 0.64  --   CREATININE 1.21*  --   --  1.15* 1.08*   < > = values in this interval not displayed.    Estimated Creatinine Clearance: 33.4 mL/min (A) (by C-G formula based on SCr of 1.08 mg/dL (H)).  Assessment: 78 yof on heparin for bilateral cardiogenic  emboli now s/p bilateral embolectomy 6/4. No AC PTA.   Heparin was restarted this morning at 0600 and 6 hour heparin level this afternoon is subtherapeutic at 0.23. hemoglobin is decreasing down to 7 today, platelets wnl at 192. Some ecchymosis and oozing noted.   Goal of Therapy:  Heparin level 0.3-0.7 units/ml Monitor platelets by anticoagulation protocol: Yes   Plan:  - bolus heparin with 700 units -increase heparin infusion to 850 units/hour - 8 hour HL -daily heparin level and CBC -monitor for bleeding -TEE Monday -f/u VVS plans for oral therapy. Consult placed to [facility] team for benefit check for maintenance doses of Eliquis and Xarelto though are likely same price since patient has Medicare.    Thank you,   [facility], PharmD PGY-1 Pharmacy Resident   Please check amion for clinical pharmacist contact number

## 2020-01-28 ENCOUNTER — Inpatient Hospital Stay (HOSPITAL_COMMUNITY): Payer: Medicare Other

## 2020-01-28 DIAGNOSIS — G9340 Encephalopathy, unspecified: Secondary | ICD-10-CM

## 2020-01-28 LAB — URINALYSIS, ROUTINE W REFLEX MICROSCOPIC
Bilirubin Urine: NEGATIVE
Glucose, UA: NEGATIVE mg/dL
Ketones, ur: NEGATIVE mg/dL
Nitrite: NEGATIVE
Protein, ur: 100 mg/dL — AB
RBC / HPF: >50 RBC/hpf — ABNORMAL HIGH (ref 0–5)
Specific Gravity, Urine: 1.015 (ref 1.005–1.030)
pH: 5 (ref 5.0–8.0)

## 2020-01-28 LAB — COMPREHENSIVE METABOLIC PANEL
ALT: 11 U/L (ref 0–44)
AST: 29 U/L (ref 15–41)
Albumin: 2.5 g/dL — ABNORMAL LOW (ref 3.5–5.0)
Alkaline Phosphatase: 49 U/L (ref 38–126)
Anion gap: 8 (ref 5–15)
BUN: 16 mg/dL (ref 8–23)
CO2: 27 mmol/L (ref 22–32)
Calcium: 8.7 mg/dL — ABNORMAL LOW (ref 8.9–10.3)
Chloride: 102 mmol/L (ref 98–111)
Creatinine, Ser: 1.07 mg/dL — ABNORMAL HIGH (ref 0.44–1.00)
GFR calc Af Amer: 58 mL/min — ABNORMAL LOW (ref >60)
GFR calc non Af Amer: 50 mL/min — ABNORMAL LOW (ref >60)
Glucose, Bld: 105 mg/dL — ABNORMAL HIGH (ref 70–99)
Potassium: 3.5 mmol/L (ref 3.5–5.1)
Sodium: 137 mmol/L (ref 135–145)
Total Bilirubin: 0.5 mg/dL (ref 0.3–1.2)
Total Protein: 6.1 g/dL — ABNORMAL LOW (ref 6.5–8.1)

## 2020-01-28 LAB — HEMOGLOBIN AND HEMATOCRIT, BLOOD
HCT: 25.4 % — ABNORMAL LOW (ref 36.0–46.0)
Hemoglobin: 8.1 g/dL — ABNORMAL LOW (ref 12.0–15.0)

## 2020-01-28 LAB — CBC
HCT: 21.6 % — ABNORMAL LOW (ref 36.0–46.0)
Hemoglobin: 6.8 g/dL — CL (ref 12.0–15.0)
MCH: 28 pg (ref 26.0–34.0)
MCHC: 31.5 g/dL (ref 30.0–36.0)
MCV: 88.9 fL (ref 80.0–100.0)
Platelets: 223 10*3/uL (ref 150–400)
RBC: 2.43 MIL/uL — ABNORMAL LOW (ref 3.87–5.11)
RDW: 13.2 % (ref 11.5–15.5)
WBC: 9.1 10*3/uL (ref 4.0–10.5)
nRBC: 0 % (ref 0.0–0.2)

## 2020-01-28 LAB — HEPARIN LEVEL (UNFRACTIONATED)
Heparin Unfractionated: 0.26 IU/mL — ABNORMAL LOW (ref 0.30–0.70)
Heparin Unfractionated: 0.25 IU/mL — ABNORMAL LOW (ref 0.30–0.70)

## 2020-01-28 LAB — MAGNESIUM: Magnesium: 1.3 mg/dL — ABNORMAL LOW (ref 1.7–2.4)

## 2020-01-28 LAB — PREPARE RBC (CROSSMATCH)

## 2020-01-28 MED ORDER — MAGNESIUM SULFATE 4 GM/100ML IV SOLN
4.0000 g | Freq: Once | INTRAVENOUS | Status: AC
  Administered 2020-01-28: 4 g via INTRAVENOUS
  Filled 2020-01-28: qty 100

## 2020-01-28 MED ORDER — SODIUM CHLORIDE 0.9% IV SOLUTION: Freq: Once | INTRAVENOUS | Status: DC

## 2020-01-28 MED ORDER — SODIUM CHLORIDE 0.9 % IV SOLN
1.0000 g | INTRAVENOUS | Status: DC
  Administered 2020-01-28 – 2020-01-29 (×2): 1 g via INTRAVENOUS
  Filled 2020-01-28 (×2): qty 10
  Filled 2020-01-28 (×2): qty 1

## 2020-01-28 NOTE — Evaluation (Signed)
Occupational Therapy Evaluation Patient Details Name: Barbara Hill MRN: [id number] DOB: 04/04/1941 Today's Date: 01/28/2020    History of Present Illness Patient is a 79 y/o female who presented with acute ischemic lower extremities now s/p bilateral popliteal exploration and embolectomy 01/25/20. 24 hours later developed compartment syndrome and went for bilateral 4 compartment fasciotomy 01/26/20.  Has some post op confusion.  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   Clinical Impression   Pt PTA: Pt living at home with family and family/chart reports independence. Pt currently set-upA to maxA overall for ADL tasks with deficits in mobility, strength, cognitive deficits- memory, problem solving, awareness and pt actively hallucinating. Pt maxA for stand pivot to and from [facility]. RN aware. Pt would benefit from continued OT skilled services for ADL, mobility and safety. OT following acutely.   O2 88% on RA; BP: sitting 104/80, 72 BPM; sitting after exertion  110/67, 82 BPM; 110/67, 75 BPM in supine.      Follow Up Recommendations  CIR    Equipment Recommendations  Other (comment)(defer to next facility)    Recommendations for Other Services Rehab consult     Precautions / Restrictions Precautions Precautions: Fall;Other (comment) Precaution Comments: watch BP Restrictions Weight Bearing Restrictions: No      Mobility Bed Mobility Overal bed mobility: Needs Assistance Bed Mobility: Supine to Sit;Sit to Supine     Supine to sit: Max assist;HOB elevated Sit to supine: Max assist   General bed mobility comments: Increased time, cues for safety and log roll to initiate rail use  Transfers Overall transfer level: Needs assistance Equipment used: 1 person hand held assist Transfers: Sit to/from [employer] Sit to Stand: Max assist Stand pivot transfers: Max assist       General transfer  comment: Pt attempting to stand, but requires cues to lean forward. Frontal stadn pivot performed x2 on/off of commode.     Balance Overall balance assessment: Needs assistance   Sitting balance-Leahy Scale: Poor Sitting balance - Comments: leaning to R after 3 mins of sitting EOB; pt requires cues for hand placement Postural control: Right lateral lean Standing balance support: Bilateral upper extremity supported Standing balance-Leahy Scale: Poor Standing balance comment: requires cues at all times and can statically stand with posterior lean for <1 min                           ADL either performed or assessed with clinical judgement   ADL Overall ADL's : Needs assistance/impaired Eating/Feeding: Minimal assistance;Sitting   Grooming: Minimal assistance;Sitting   Upper Body Bathing: Minimal assistance;Sitting   Lower Body Bathing: Maximal assistance;Sitting/lateral leans;Sit to/from stand;Cueing for sequencing;Cueing for safety   Upper Body Dressing : Minimal assistance;Sitting   Lower Body Dressing: Maximal assistance;Cueing for safety;Cueing for sequencing;Sitting/lateral leans;Sit to/from stand   Toilet Transfer: Maximal assistance;Stand-pivot;BSC Toilet Transfer Details (indicate cue type and reason): cues to hold onto OTR Toileting- Clothing Manipulation and Hygiene: Total assistance;Sitting/lateral lean;Sit to/from stand Toileting - Clothing Manipulation Details (indicate cue type and reason): Pt unable to let go of RW     Functional mobility during ADLs: Maximal assistance;Rolling walker;Cueing for safety;Cueing for sequencing General ADL Comments: Pt set-upA to maxA overall for ADL tasks with deficits in mobility, strength, cognitive deficits- memory, problem solving, awareness and pt actively hallucinating. RN aware     Vision Baseline Vision/History: Wears glasses Wears Glasses: Reading  only Patient Visual Report: No change from baseline Vision  Assessment?: No apparent visual deficits     Perception     Praxis      Pertinent Vitals/Pain Pain Assessment: Faces Faces Pain Scale: Hurts little more Pain Location: bilateral LE's Pain Descriptors / Indicators: Aching;Grimacing;Discomfort Pain Intervention(s): Monitored during session     Hand Dominance Right   Extremity/Trunk Assessment Upper Extremity Assessment Upper Extremity Assessment: Generalized weakness   Lower Extremity Assessment Lower Extremity Assessment: Generalized weakness   Cervical / Trunk Assessment Cervical / Trunk Assessment: Kyphotic   Communication Communication Communication: Expressive difficulties;Other (comment)(unintelligble speech at times; tangential or hallucinating)   Cognition Arousal/Alertness: Awake/alert Behavior During Therapy: Anxious Overall Cognitive Status: Impaired/Different from baseline Area of Impairment: Attention;Following commands;Problem solving;Memory;Orientation                 Orientation Level: Time;Disoriented to Current Attention Level: Focused Memory: Decreased short-term memory Following Commands: Follows one step commands inconsistently;Follows one step commands with increased time     Problem Solving: Slow processing;Difficulty sequencing;Requires verbal cues;Requires tactile cues General Comments: Pt unable to state today's date- only bday and talking tangentially/non sensical. Pt's RN aware of pt hallucinating.   General Comments  O2 ~88% on RA. BP: sitting 104/80, 72 BPM; sitting afterexertion  110/67, 82 BPM; 110/67, 75 BPM in supine.    Exercises     Shoulder Instructions      Home Living Family/patient expects to be discharged to:: Private residence Living Arrangements: Spouse/significant other;Children Available Help at Discharge: Family;Available 24 hours/day Type of Home: House Home Access: Stairs to enter [employer] of Steps: 1-2 Entrance Stairs-Rails: None Home Layout:  One level     Bathroom Shower/Tub: Producer, television/film/video: Handicapped height Bathroom Accessibility: Yes How Accessible: Accessible via walker Home Equipment: Walker - 2 wheels;Shower seat   Additional Comments: info from family member as pt AMS  Lives With: Spouse;Daughter    Prior Functioning/Environment Level of Independence: Independent                 OT Problem List: Decreased strength;Decreased activity tolerance;Impaired balance (sitting and/or standing);Decreased cognition;Decreased safety awareness;Decreased knowledge of use of DME or AE;Impaired UE functional use;Pain;Cardiopulmonary status limiting activity      OT Treatment/Interventions: Self-care/ADL training;Therapeutic exercise;DME and/or AE instruction;Therapeutic activities;Patient/family education;Balance training;Cognitive remediation/compensation;Energy conservation    OT Goals(Current goals can be found in the care plan section) Acute Rehab OT Goals Patient Stated Goal: to go home OT Goal Formulation: With patient/family Time For Goal Achievement: 02/11/20 Potential to Achieve Goals: Good ADL Goals Pt Will Perform Grooming: with set-up;sitting Pt Will Perform Lower Body Dressing: with min assist;sitting/lateral leans;sit to/from stand Pt Will Transfer to Toilet: with min assist;stand pivot transfer;bedside commode Pt Will Perform Toileting - Clothing Manipulation and hygiene: sitting/lateral leans;sit to/from stand;with min guard assist Additional ADL Goal #1: Pt will consistently follow 1-2 step commands with ADL/mobility tasks in order to increase awareness and increase independence.  OT Frequency: Min 2X/week   Barriers to D/C:            Co-evaluation              AM-PAC OT "6 Clicks" Daily Activity     Outcome Measure Help from another person eating meals?: A Little Help from another person taking care of personal grooming?: A Lot Help from another person toileting, which  includes using toliet, bedpan, or urinal?: A Lot Help from another person bathing (including washing, rinsing, drying)?: A  Lot Help from another person to put on and taking off regular upper body clothing?: A Little Help from another person to put on and taking off regular lower body clothing?: A Lot 6 Click Score: 14   End of Session Equipment Utilized During Treatment: Gait belt;Oxygen Nurse Communication: Mobility status;Other (comment)(NT aware of urine to catch for UA sample)  Activity Tolerance: Patient tolerated treatment well;Treatment limited secondary to medical complications (Comment) Patient left: in bed;with call bell/phone within reach;with bed alarm set;with family/visitor present  OT Visit Diagnosis: Unsteadiness on feet (R26.81);Muscle weakness (generalized) (M62.81);Pain;Other symptoms and signs involving cognitive function Pain - Right/Left: Right Pain - part of body: Leg(B/L legs)                Time: 1040-1120 OT Time Calculation (min): 40 min Charges:  OT General Charges $OT Visit: 1 Visit OT Evaluation $OT Eval Moderate Complexity: 1 Mod OT Treatments $Self Care/Home Management : 23-37 mins  Jefferey Pica, OTR/L Acute Rehabilitation Services Pager: [phone] Office: [phone]   [id number] 01/28/2020, 12:17 PM

## 2020-01-28 NOTE — Progress Notes (Signed)
CRITICAL VALUE ALERT  Critical Value:  Hgb=6.9  Date & Time Notied: 01/28/20  0710  Provider Notified: Dr. Rhona Leavens  Orders Received/Actions taken: Awaiting callback

## 2020-01-28 NOTE — Progress Notes (Signed)
PROGRESS NOTE    Barbara Hill  DGL:[id number] DOB: [birth year] DOA: 01/24/2020 PCP: Richmond Campbell., PA-C    Brief Narrative:  79 y.o. 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  History is obtained from the patient, but she seems intermittently confused and is not aware of the date and ask for her daughter who is not been by per nursing staff.  Her right leg pain started acutely and was severe.  A couple days prior she says that her heart was beating very fast for which EMS was called, but by their arrival was beating normally.  She had also had long-term Holter monitoring performed in March of this year which showed no significant arrhythmias.  She does admit to still smoking cigarettes, but reports that she will quit as she will related with the pain that she had in her right leg that brought her to the hospital.  ED Course: Upon admission into the emergency department patient was noted to be afebrile with a pressure elevated up to 217/84, heart rates intermittently fluctuating from 60-130, and all other vital signs maintained.  Labs are significant for hemoglobin 10, BUN 18, and creatinine 1.21.  CT scan of the abdomen pelvis revealed bilateral popliteal embolus.  Patient was taken immediately to the operating grading room by vascular surgery for bilateral popliteal embolectomy.  Assessment & Plan:   Principal Problem:   Ischemic leg Active Problems:   Paroxysmal SVT (supraventricular tachycardia) (HCC)   Tobacco use disorder   COPD (chronic obstructive pulmonary disease) (HCC)   Normocytic anemia   History of CVA (cerebrovascular accident)   Anxiety   CKD (chronic kidney disease), stage III  Bilateral ischemic legs: Acute.   -Pt with concerns of bilateral popliteal embolic  disease -Vascular Surgery consulted and patient underwent B popliteal exploration and embolectomy on 6/3 -Post-operatively, pt complained of increased LE pain and on re-evaluation by Vascular Surgery, decision made for 4 compartment fasciotomies, performed on 6/4 -per Vascular, OK to get out of bed  -Hgb down to 6.8 from 7.0 yesterday. Have ordered 1 unit PRBC.  -Per Vascular Surgery, plan to continue heparin for now  Paroxysmal SVT:  -On admission patient appeared to be in sinus rhythm.   -Cont with home flecainide 50mg  twice daily -Continue on heparin gtt as per above  COPD/tobacco use:  -Patient without signs of acute exacerbation of her COPD.   -Reportedly declined nicotine patch on presentation -cont with Albuterol nebs as needed shortness of breath/wheezing -cont to recommend tobacco cessation  Memory deficit with acute toxicmetabolic encephalopathy  -Patient noted issues with memory and possibly confused at times.   -Recently noted to be oriented to place and seems to have insight to her current medical situation -This AM, seems more confused -CXR and UA ordered and reviewed. CXR with findings of COPD changes. UA with blood, leukocytes and elevated WBC's.  -Will start empiric rocephin. F/u urine cx -Low threshold for CT head in setting of CVA as well as therapeutic anticoagulation  Normocytic anemia:  -Hemoglobin down to 6.8 this AM, one unit PRBC ordered -Continue to monitor closely while on anticoagulation  History of CVA:  -Patient with previous history of stroke following procedure in 2017. -No notable neurologic changes noted this AM  Essential hypertension:  -Home medications include losartan 25 mg daily and metoprolol  25 mg nightly -Continued on metoprolol -BP stable at this time `  Chronic kidney disease stage IIIb: Patient presents with creatinine of 1.21 which appears within her baseline of 1.2-1.3. -recheck bmet in AM  Anxiety: Continue Xanax 0.5 mg  3 times daily as needed and fluoxetine 20 mg twice daily -Continue Xanax and fluoxetine as tolerated  GERD: Home meds include omeprazole 20 mg daily -Continue with protonix as pt tolerates  DVT prophylaxis: Heparin gtt Code Status: Full Family Communication: Pt in room, family not at bedside  Status is: Inpatient  Remains inpatient appropriate because:Hemodynamically unstable, Ongoing diagnostic testing needed not appropriate for outpatient work up, IV treatments appropriate due to intensity of illness or inability to take PO and Inpatient level of care appropriate due to severity of illness   Dispo: The patient is from: Home              Anticipated d/c is to: CIR              Anticipated d/c date is: > 3 days, when cleared by Vascular              Patient currently is not medically stable to d/c.       Consultants:   Vascular Surgery  Procedures:  B popliteal exploration and embolectomy on 6/3 Emergent 4 compartment fasciotomy 6/4  Antimicrobials: Anti-infectives (From admission, onward)   Start     Dose/Rate Route Frequency Ordered Stop   01/28/20 1400  cefTRIAXone (ROCEPHIN) 1 g in sodium chloride 0.9 % 100 mL IVPB    Note to Pharmacy: Allergy list reviewed Per checklist, should be OK for cephalosporin   1 g 200 mL/hr over 30 Minutes Intravenous Every 24 hours 01/28/20 1358     01/26/20 0918  ceFAZolin (ANCEF) 2-4 GM/100ML-% IVPB    Note to Pharmacy: Evern Bio   : cabinet override      01/26/20 0918 01/26/20 2129      Subjective: More confused this AM  Objective: Vitals:   01/28/20 1217 01/28/20 1230 01/28/20 1315 01/28/20 1329  BP: 90/73 116/83 (!) 141/60   Pulse: 70 66 69 72  Resp: 18 20 20 15   Temp: 98.3 F (36.8 C) 98.7 F (37.1 C) 99.3 F (37.4 C)   TempSrc: Oral Oral Oral   SpO2: 98% 96% 100% 100%  Weight:      Height:        Intake/Output Summary (Last 24 hours) at 01/28/2020 1422 Last data filed at 01/28/2020 1315 Gross per 24 hour    Intake 315 ml  Output 350 ml  Net -35 ml   Filed Weights   01/24/20 2115  Weight: 48.5 kg    Examination: General exam: Conversant, in no acute distress, confused Respiratory system: normal chest rise, clear, no audible wheezing Cardiovascular system: regular rhythm, s1-s2 Gastrointestinal system: Nondistended, nontender, pos BS Central nervous system: No seizures, no tremors Extremities: No cyanosis, no joint deformities Skin: No rashes, no pallor Psychiatry: unable to assess, currently confused  Data Reviewed: I have personally reviewed following labs and imaging studies  CBC: Recent Labs  Lab 01/24/20 2200 01/26/20 0315 01/27/20 0304 01/28/20 0603  WBC 6.1 10.1 11.1* 9.1  NEUTROABS 2.5  --   --   --   HGB 10.0* 8.1* 7.0* 6.8*  HCT 31.5* 25.5* 21.7* 21.6*  MCV 88.5 87.6 88.9 88.9  PLT 243 232 192 223   Basic Metabolic Panel: Recent Labs  Lab 01/24/20 2200 01/26/20 0315 01/27/20 0304 01/28/20  0603  NA 140 137 136 137  K 3.9 3.4* 3.3* 3.5  CL 104 100 100 102  CO2 28 28 25 27   GLUCOSE 102* 133* 109* 105*  BUN 18 14 15 16   CREATININE 1.21* 1.15* 1.08* 1.07*  CALCIUM 8.8* 8.5* 8.5* 8.7*  MG  --   --   --  1.3*   GFR: Estimated Creatinine Clearance: 33.7 mL/min (A) (by C-G formula based on SCr of 1.07 mg/dL (H)). Liver Function Tests: Recent Labs  Lab 01/27/20 0304 01/28/20 0603  AST 17 29  ALT 6 11  ALKPHOS 49 49  BILITOT 0.6 0.5  PROT 5.6* 6.1*  ALBUMIN 2.5* 2.5*   No results for input(s): LIPASE, AMYLASE in the last 168 hours. No results for input(s): AMMONIA in the last 168 hours. Coagulation Profile: No results for input(s): INR, PROTIME in the last 168 hours. Cardiac Enzymes: No results for input(s): CKTOTAL, CKMB, CKMBINDEX, TROPONINI in the last 168 hours. BNP (last 3 results) No results for input(s): PROBNP in the last 8760 hours. HbA1C: No results for input(s): HGBA1C in the last 72 hours. CBG: No results for input(s): GLUCAP in the  last 168 hours. Lipid Profile: No results for input(s): CHOL, HDL, LDLCALC, TRIG, CHOLHDL, LDLDIRECT in the last 72 hours. Thyroid Function Tests: Recent Labs    01/25/20 1947  TSH 0.372   Anemia Panel: No results for input(s): VITAMINB12, FOLATE, FERRITIN, TIBC, IRON, RETICCTPCT in the last 72 hours. Sepsis Labs: No results for input(s): PROCALCITON, LATICACIDVEN in the last 168 hours.  Recent Results (from the past 240 hour(s))  SARS Coronavirus 2 by RT PCR (hospital order, performed in [facility] hospital lab) Nasopharyngeal Nasopharyngeal Swab     Status: None   Collection Time: 01/25/20  4:23 AM   Specimen: Nasopharyngeal Swab  Result Value Ref Range Status   SARS Coronavirus 2 NEGATIVE NEGATIVE Final    Comment: (NOTE) SARS-CoV-2 target nucleic acids are NOT DETECTED. The SARS-CoV-2 RNA is generally detectable in upper and lower respiratory specimens during the acute phase of infection. The lowest concentration of SARS-CoV-2 viral copies this assay can detect is 250 copies / mL. A negative result does not preclude SARS-CoV-2 infection and should not be used as the sole basis for treatment or other patient management decisions.  A negative result may occur with improper specimen collection / handling, submission of specimen other than nasopharyngeal swab, presence of viral mutation(s) within the areas targeted by this assay, and inadequate number of viral copies (<250 copies / mL). A negative result must be combined with clinical observations, patient history, and epidemiological information. Fact Sheet for Patients:   [facility] Fact Sheet for Healthcare Providers: 03/26/20 This test is not yet approved or cleared  by the [employer] FDA and has been authorized for detection and/or diagnosis of SARS-CoV-2 by FDA under an Emergency Use Authorization (EUA).  This EUA will remain in effect (meaning this test  can be used) for the duration of the COVID-19 declaration under Section 564(b)(1) of the Act, 21 U.S.C. section 360bbb-3(b)(1), unless the authorization is terminated or revoked sooner. Performed at [facility], [address] [place], [address] [place]      Radiology Studies: DG CHEST PORT 1 VIEW  Result Date: 01/28/2020 CLINICAL DATA:  Altered level of consciousness, shortness of breath, post BILATERAL popliteal artery embolectomy 01/26/2020 EXAM: PORTABLE CHEST 1 VIEW COMPARISON:  Portable exam 1030 hours compared to 03/23/2015 FINDINGS: Borderline enlargement of cardiac silhouette. Mediastinal contours and  pulmonary vascularity normal. Emphysematous and bronchitic changes consistent with COPD. Minimal bibasilar atelectasis. Remaining lungs clear. No infiltrate, pleural effusion, or pneumothorax. Bones demineralized. IMPRESSION: Changes of COPD with minimal bibasilar atelectasis. Electronically Signed   By: Lavonia Dana M.D.   On: 01/28/2020 12:25    Scheduled Meds: . sodium chloride   Intravenous Once  . Chlorhexidine Gluconate Cloth  6 each Topical Daily  . clopidogrel  75 mg Oral Daily  . flecainide  50 mg Oral BID  . FLUoxetine  20 mg Oral BID  . metoprolol succinate  25 mg Oral QHS  . pantoprazole  40 mg Oral Daily  . sodium chloride flush  3 mL Intravenous Q12H   Continuous Infusions: . cefTRIAXone (ROCEPHIN)  IV    . heparin 950 Units/hr (01/28/20 1137)  . magnesium sulfate bolus IVPB       LOS: 3 days   Marylu Lund, MD [facility] Pager On Amion  If 7PM-7AM, please contact night-coverage 01/28/2020, 2:22 PM

## 2020-01-28 NOTE — Plan of Care (Signed)
  Problem: Education: Goal: Knowledge of General Education information will improve Description Including pain rating scale, medication(s)/side effects and non-pharmacologic comfort measures Outcome: Progressing   Problem: Health Behavior/Discharge Planning: Goal: Ability to manage health-related needs will improve Outcome: Progressing   

## 2020-01-28 NOTE — Progress Notes (Signed)
ANTICOAGULATION CONSULT NOTE - Follow Up Consult  Pharmacy Consult for Heparin Indication: s/p BL popliteal embolectomy  Allergies  Allergen Reactions   Dobutamine Other (See Comments)    Heart beating hard with CP, neck pain, and weakness   Epinephrine Other (See Comments)    Fast heart beat   Darvocet [Propoxyphene N-Acetaminophen] Nausea And Vomiting   Excedrin Extra Strength [Aspirin-Acetaminophen-Caffeine] Nausea And Vomiting   Aspirin    Clarithromycin Other (See Comments)    Yeast infection   Doxycycline Other (See Comments)    Makes stomach hurt   Penicillins Other (See Comments)    Yeast infection  Has patient had a PCN reaction causing immediate rash, facial/tongue/throat swelling, SOB or lightheadedness with hypotension: No Has patient had a PCN reaction causing severe rash involving mucus membranes or skin necrosis: No Has patient had a PCN reaction that required hospitalization No Has patient had a PCN reaction occurring within the last 10 years: No If all of the above answers are "NO", then may proceed with Cephalosporin use.    Prednisone Other (See Comments)    Shakes    Propoxyphene Nausea And Vomiting    Patient Measurements: Height: 5\' 3"  (160 cm) Weight: 48.5 kg (107 lb) IBW/kg (Calculated) : 52.4 Heparin Dosing Weight:  48.5 kg  Vital Signs: Temp: 98.3 F (36.8 C) (06/06 0451) Temp Source: Oral (06/06 0451) BP: 127/63 (06/06 0451) Pulse Rate: 69 (06/06 0451)  Labs: Recent Labs    01/26/20 0315 01/26/20 0315 01/27/20 0304 01/27/20 1152 01/27/20 2100 01/28/20 0603  HGB 8.1*   < > 7.0*  --   --  6.8*  HCT 25.5*  --  21.7*  --   --  21.6*  PLT 232  --  192  --   --  223  HEPARINUNFRC 0.64   < >  --  0.23* 0.38 0.26*  CREATININE 1.15*  --  1.08*  --   --  1.07*   < > = values in this interval not displayed.    Estimated Creatinine Clearance: 33.7 mL/min (A) (by C-G formula based on SCr of 1.07 mg/dL (H)).  Assessment: 78 yof  on heparin for bilateral cardiogenic emboli now s/p bilateral embolectomy 6/4. No AC PTA.   Heparin level this morning is subtherapeutic at 0.26 after rate increase yesterday and therapeutic level last night. No infusion issues reported by RN. Her hgb has decreased from 7 yesterday to 6.8 today, but hct has been stable ~21. plts remain wnl at 223. No overt bleeding noted. MD ordered 1 unit of PRBC to be transfused today.   Goal of Therapy:  Heparin level 0.3-0.7 units/ml Monitor platelets by anticoagulation protocol: Yes   Plan:  - no bolus due to decreasing hemoglobin  - increase heparin infusion to 950 units/hour - 8 hour HL - daily heparin level and CBC - monitor for bleeding -TEE Monday -f/u VVS plans for oral therapy. Consult placed to [facility] team for benefit check for maintenance doses of Eliquis and Xarelto though are likely same price since patient has Medicare.    Thank you,   [facility], PharmD PGY-1 Pharmacy Resident   Please check amion for clinical pharmacist contact number

## 2020-01-28 NOTE — Progress Notes (Addendum)
Progress Note    01/28/2020 7:16 AM 2 Days Post-Op  Subjective: She is awake and alert this morning.  During most of our interaction she is alert and oriented x4 but then well discuss her recent trip to [place].  She endorses bilateral lower extremity incisional pain.   Vitals:   01/28/20 0500 01/28/20 0600  BP:    Pulse:    Resp: 16 18  Temp:    SpO2:     Physical therapy note 6/5 Clinical Impression  Patient presents with decreased mobility secondary to pain, limited activity tolerance with BP drop in sitting to 86/47 and nausea, decreased balance, decreased ROM/strength in LE's and she will benefit from skilled PT in the acute setting to allow eventual return home with family support.    Physical Exam: Cardiac: Rate and rhythm regular Lungs: There to auscultation bilaterally Incisions: Lower extremity fasciotomy incisions well approximated.  Tiny amount of serosanguineous drainage on dressings. Extremities: Brisk Doppler signals bilaterally.  Motor and sensation intact.  Edema of inferior incisions improved.   CBC    Component Value Date/Time   WBC 9.1 01/28/2020 0603   RBC 2.43 (L) 01/28/2020 0603   HGB 6.8 (LL) 01/28/2020 0603   HGB 10.6 (L) 04/10/2019 1047   HCT 21.6 (L) 01/28/2020 0603   HCT 31.9 (L) 04/10/2019 1047   PLT 223 01/28/2020 0603   PLT 308 04/10/2019 1047   MCV 88.9 01/28/2020 0603   MCV 91 04/10/2019 1047   MCV 92 10/23/2013 0803   MCH 28.0 01/28/2020 0603   MCHC 31.5 01/28/2020 0603   RDW 13.2 01/28/2020 0603   RDW 11.3 (L) 04/10/2019 1047   RDW 14.0 10/23/2013 0803   LYMPHSABS 2.6 01/24/2020 2200   LYMPHSABS 2.4 10/23/2013 0803   MONOABS 0.7 01/24/2020 2200   MONOABS 0.7 10/23/2013 0803   EOSABS 0.3 01/24/2020 2200   EOSABS 0.3 10/23/2013 0803   BASOSABS 0.0 01/24/2020 2200   BASOSABS 0.1 10/23/2013 0803    BMET    Component Value Date/Time   NA 137 01/28/2020 0603   NA 139 04/10/2019 1047   NA 140 10/23/2013 0803   K 3.5  01/28/2020 0603   K 4.6 10/23/2013 0803   CL 102 01/28/2020 0603   CL 107 10/23/2013 0803   CO2 27 01/28/2020 0603   CO2 28 10/23/2013 0803   GLUCOSE 105 (H) 01/28/2020 0603   GLUCOSE 87 10/23/2013 0803   BUN 16 01/28/2020 0603   BUN 19 04/10/2019 1047   BUN 6 (L) 10/23/2013 0803   CREATININE 1.07 (H) 01/28/2020 0603   CREATININE 0.66 07/24/2016 1125   CALCIUM 8.7 (L) 01/28/2020 0603   CALCIUM 8.8 10/23/2013 0803   GFRNONAA 50 (L) 01/28/2020 0603   GFRNONAA >60 10/23/2013 0803   GFRAA 58 (L) 01/28/2020 0603   GFRAA >60 10/23/2013 0803     Intake/Output Summary (Last 24 hours) at 01/28/2020 0716 Last data filed at 01/27/2020 2328 Gross per 24 hour  Intake --  Output 350 ml  Net -350 ml    HOSPITAL MEDICATIONS Scheduled Meds:  Chlorhexidine Gluconate Cloth  6 each Topical Daily   clopidogrel  75 mg Oral Daily   flecainide  50 mg Oral BID   FLUoxetine  20 mg Oral BID   metoprolol succinate  25 mg Oral QHS   pantoprazole  40 mg Oral Daily   sodium chloride flush  3 mL Intravenous Q12H   Continuous Infusions:  heparin 850 Units/hr (01/27/20 1347)   PRN Meds:.acetaminophen **  OR** acetaminophen, ALPRAZolam, morphine injection, ondansetron **OR** ondansetron (ZOFRAN) IV, oxyCODONE  Assessment: Patient presented with acute ischemic lower extremities during bilateral popliteal exploration and embolectomy.  Approximately 24 hours later she complained of acute bilateral lower leg pain consistent with compartment syndrome.  Returned to the OR urgently for bilateral 4 compartment fasciotomy.    Noted drift downward in hemoglobin. Platelet count normal.  No active bleeding  Plan: -Order for 1 unit packed cells in place.  Continue heparin infusion. -DVT prophylaxis: Heparin infusion   Risa Grill, PA-C [facility] [phone] 01/28/2020  7:16 AM   Agree with above.  Pt with viable extremities.  Refusing to let me see her feet today.  States we are  trying to trick her and she wants to finish her cigarette.  May need head CT at some point if doesn't clear.  Ruta Hinds, MD [facility] [place] Office: [phone]

## 2020-01-28 NOTE — Progress Notes (Signed)
ANTICOAGULATION CONSULT NOTE - Follow Up Consult  Pharmacy Consult for Heparin Indication: s/p BL popliteal embolectomy  Allergies  Allergen Reactions  . Dobutamine Other (See Comments)    Heart beating hard with CP, neck pain, and weakness  . Epinephrine Other (See Comments)    Fast heart beat  . Darvocet [Propoxyphene N-Acetaminophen] Nausea And Vomiting  . Excedrin Extra Strength [Aspirin-Acetaminophen-Caffeine] Nausea And Vomiting  . Aspirin   . Clarithromycin Other (See Comments)    Yeast infection  . Doxycycline Other (See Comments)    Makes stomach hurt  . Penicillins Other (See Comments)    Yeast infection  Has patient had a PCN reaction causing immediate rash, facial/tongue/throat swelling, SOB or lightheadedness with hypotension: No Has patient had a PCN reaction causing severe rash involving mucus membranes or skin necrosis: No Has patient had a PCN reaction that required hospitalization No Has patient had a PCN reaction occurring within the last 10 years: No If all of the above answers are "NO", then may proceed with Cephalosporin use.   . Prednisone Other (See Comments)    Shakes   . Propoxyphene Nausea And Vomiting    Patient Measurements: Height: 5\' 3"  (160 cm) Weight: 48.5 kg (107 lb) IBW/kg (Calculated) : 52.4 Heparin Dosing Weight:  48.5 kg  Vital Signs: Temp: 98.2 F (36.8 C) (06/06 1611) Temp Source: Oral (06/06 1512) BP: 149/91 (06/06 1611) Pulse Rate: 75 (06/06 1611)  Labs: Recent Labs    01/26/20 0315 01/26/20 0315 01/27/20 0304 01/27/20 0304 01/27/20 1152 01/27/20 2100 01/28/20 0603 01/28/20 1610  HGB 8.1*   < > 7.0*   < >  --   --  6.8* 8.1*  HCT 25.5*   < > 21.7*  --   --   --  21.6* 25.4*  PLT 232  --  192  --   --   --  223  --   HEPARINUNFRC 0.64  --   --   --    < > 0.38 0.26* 0.25*  CREATININE 1.15*  --  1.08*  --   --   --  1.07*  --    < > = values in this interval not displayed.    Estimated Creatinine Clearance: 33.7  mL/min (A) (by C-G formula based on SCr of 1.07 mg/dL (H)).  Assessment: 78 yof on heparin for bilateral cardiogenic emboli now s/p bilateral embolectomy 6/4. No AC PTA.  -heparin level= 0.25 after increase to 900 units/hr  Goal of Therapy:  Heparin level 0.3-0.7 units/ml Monitor platelets by anticoagulation protocol: Yes   Plan:  - Increase heparin infusion to 1050 units/hour - daily heparin level and CBC  8/4, PharmD Clinical Pharmacist **Pharmacist phone directory can now be found on [email or web address] ([email or web address]).  Listed under [facility].

## 2020-01-29 ENCOUNTER — Inpatient Hospital Stay (HOSPITAL_COMMUNITY): Payer: Medicare Other | Admitting: Certified Registered Nurse Anesthetist

## 2020-01-29 ENCOUNTER — Inpatient Hospital Stay (HOSPITAL_COMMUNITY): Payer: Medicare Other

## 2020-01-29 ENCOUNTER — Encounter (HOSPITAL_COMMUNITY)
Admission: EM | Disposition: A | Discharge status: Skilled Nursing Facility | Payer: Self-pay | Source: Home / Self Care | Attending: Internal Medicine

## 2020-01-29 ENCOUNTER — Other Ambulatory Visit: Payer: Self-pay

## 2020-01-29 ENCOUNTER — Encounter (HOSPITAL_COMMUNITY): Payer: Self-pay | Admitting: Internal Medicine

## 2020-01-29 DIAGNOSIS — I340 Nonrheumatic mitral (valve) insufficiency: Secondary | ICD-10-CM

## 2020-01-29 HISTORY — PX: TEE WITHOUT CARDIOVERSION: SHX5443

## 2020-01-29 HISTORY — PX: BUBBLE STUDY: SHX6837

## 2020-01-29 LAB — MAGNESIUM: Magnesium: 2.3 mg/dL (ref 1.7–2.4)

## 2020-01-29 LAB — COMPREHENSIVE METABOLIC PANEL
ALT: 11 U/L (ref 0–44)
AST: 26 U/L (ref 15–41)
Albumin: 2.5 g/dL — ABNORMAL LOW (ref 3.5–5.0)
Alkaline Phosphatase: 50 U/L (ref 38–126)
Anion gap: 9 (ref 5–15)
BUN: 17 mg/dL (ref 8–23)
CO2: 28 mmol/L (ref 22–32)
Calcium: 8.7 mg/dL — ABNORMAL LOW (ref 8.9–10.3)
Chloride: 101 mmol/L (ref 98–111)
Creatinine, Ser: 1.03 mg/dL — ABNORMAL HIGH (ref 0.44–1.00)
GFR calc Af Amer: >60 mL/min (ref >60)
GFR calc non Af Amer: 52 mL/min — ABNORMAL LOW (ref >60)
Glucose, Bld: 101 mg/dL — ABNORMAL HIGH (ref 70–99)
Potassium: 3.7 mmol/L (ref 3.5–5.1)
Sodium: 138 mmol/L (ref 135–145)
Total Bilirubin: 0.4 mg/dL (ref 0.3–1.2)
Total Protein: 5.9 g/dL — ABNORMAL LOW (ref 6.5–8.1)

## 2020-01-29 LAB — CBC
HCT: 24.7 % — ABNORMAL LOW (ref 36.0–46.0)
Hemoglobin: 8 g/dL — ABNORMAL LOW (ref 12.0–15.0)
MCH: 28.7 pg (ref 26.0–34.0)
MCHC: 32.4 g/dL (ref 30.0–36.0)
MCV: 88.5 fL (ref 80.0–100.0)
Platelets: 248 10*3/uL (ref 150–400)
RBC: 2.79 MIL/uL — ABNORMAL LOW (ref 3.87–5.11)
RDW: 13.4 % (ref 11.5–15.5)
WBC: 7.9 10*3/uL (ref 4.0–10.5)
nRBC: 0.3 % — ABNORMAL HIGH (ref 0.0–0.2)

## 2020-01-29 LAB — HEPARIN LEVEL (UNFRACTIONATED): Heparin Unfractionated: 0.49 IU/mL (ref 0.30–0.70)

## 2020-01-29 LAB — PROTIME-INR
INR: 1.3 — ABNORMAL HIGH (ref 0.8–1.2)
Prothrombin Time: 15.5 seconds — ABNORMAL HIGH (ref 11.4–15.2)

## 2020-01-29 SURGERY — ECHOCARDIOGRAM, TRANSESOPHAGEAL
Anesthesia: Monitor Anesthesia Care

## 2020-01-29 MED ORDER — PROPOFOL 500 MG/50ML IV EMUL
INTRAVENOUS | Status: DC | PRN
  Administered 2020-01-29: 150 ug/kg/min via INTRAVENOUS

## 2020-01-29 MED ORDER — PROPOFOL 10 MG/ML IV BOLUS
INTRAVENOUS | Status: DC | PRN
  Administered 2020-01-29: 20 mg via INTRAVENOUS

## 2020-01-29 MED ORDER — LACTATED RINGERS IV SOLN: INTRAVENOUS | Status: DC

## 2020-01-29 MED ORDER — BUTAMBEN-TETRACAINE-BENZOCAINE 2-2-14 % EX AERO
INHALATION_SPRAY | CUTANEOUS | Status: DC | PRN
  Administered 2020-01-29: 2 via TOPICAL

## 2020-01-29 MED ORDER — SODIUM CHLORIDE 0.9 % IV SOLN: INTRAVENOUS | Status: DC

## 2020-01-29 NOTE — Progress Notes (Signed)
ANTICOAGULATION CONSULT NOTE - Follow Up Consult  Pharmacy Consult for Heparin Indication: s/p BL popliteal embolectomy  Allergies  Allergen Reactions  . Dobutamine Other (See Comments)    Heart beating hard with CP, neck pain, and weakness  . Epinephrine Other (See Comments)    Fast heart beat  . Darvocet [Propoxyphene N-Acetaminophen] Nausea And Vomiting  . Excedrin Extra Strength [Aspirin-Acetaminophen-Caffeine] Nausea And Vomiting  . Aspirin   . Clarithromycin Other (See Comments)    Yeast infection  . Doxycycline Other (See Comments)    Makes stomach hurt  . Penicillins Other (See Comments)    Yeast infection  Has patient had a PCN reaction causing immediate rash, facial/tongue/throat swelling, SOB or lightheadedness with hypotension: No Has patient had a PCN reaction causing severe rash involving mucus membranes or skin necrosis: No Has patient had a PCN reaction that required hospitalization No Has patient had a PCN reaction occurring within the last 10 years: No If all of the above answers are "NO", then may proceed with Cephalosporin use.   . Prednisone Other (See Comments)    Shakes   . Propoxyphene Nausea And Vomiting    Patient Measurements: Height: 5\' 3"  (160 cm) Weight: 48.5 kg (107 lb) IBW/kg (Calculated) : 52.4 Heparin Dosing Weight:  48.5 kg  Vital Signs: Temp: 98.5 F (36.9 C) (06/07 0408) Temp Source: Oral (06/07 0408) BP: 138/79 (06/07 0408) Pulse Rate: 65 (06/07 0408)  Labs: Recent Labs    01/27/20 0304 01/27/20 1152 01/28/20 0603 01/28/20 0603 01/28/20 1610 01/29/20 0316  HGB 7.0*  --  6.8*   < > 8.1* 8.0*  HCT 21.7*  --  21.6*  --  25.4* 24.7*  PLT 192  --  223  --   --  248  HEPARINUNFRC  --    < > 0.26*  --  0.25* 0.49  CREATININE 1.08*  --  1.07*  --   --  1.03*   < > = values in this interval not displayed.    Estimated Creatinine Clearance: 34.5 mL/min (A) (by C-G formula based on SCr of 1.03 mg/dL (H)).  Assessment: 78 yof  on heparin for bilateral cardiogenic emboli now s/p bilateral embolectomy 6/4. No AC PTA.  -heparin level= 0.49 after increase to 1050 units/hr  Goal of Therapy:  Heparin level 0.3-0.7 units/ml Monitor platelets by anticoagulation protocol: Yes   Plan:  - Increase heparin infusion to 1050 units/hour - daily heparin level and CBC - F/u plans for oral anticoagulation prior to discharge.  Have asked case management to investigate price.  8/4, Reece Leader, BCCP Clinical Pharmacist  01/29/2020 7:53 AM   [facility] pharmacy phone numbers are listed on [email or web address]

## 2020-01-29 NOTE — CV Procedure (Signed)
INDICATIONS: r/o intracardiac source of thromboembolism  PROCEDURE:   Informed consent was obtained prior to the procedure. The risks, benefits and alternatives for the procedure were discussed and the patient comprehended these risks.  Risks include, but are not limited to, cough, sore throat, vomiting, nausea, somnolence, esophageal and stomach trauma or perforation, bleeding, low blood pressure, aspiration, pneumonia, infection, trauma to the teeth and death.    After a procedural time-out, the oropharynx was anesthetized with 20% benzocaine spray.   During this procedure the patient was administered propofol per anesthesia Lafonda Mosses CRNA).  The patient's heart rate, blood pressure, and oxygen saturationweare monitored continuously during the procedure. The period of conscious sedation was 35 minutes, of which I was present face-to-face 100% of this time.  The transesophageal probe was inserted in the esophagus and stomach without difficulty and multiple views were obtained.  The patient was kept under observation until the patient left the procedure room.  The patient left the procedure room in stable condition.   Agitated microbubble saline contrast was administered.  COMPLICATIONS:    There were no immediate complications.  FINDINGS:  No LV apical thrombus No LA appendage thrombus or LA mural thrombus.  Mild-moderate aortic valve regurgitation.  No mobile valvular lesions. Negative for PFO.   RECOMMENDATIONS:     return to hospital room when stable.   Time Spent Directly with the Patient:  60 minutes   Lakie Mclouth A Jacques Navy 01/29/2020, 1:04 PM

## 2020-01-29 NOTE — Care Management Important Message (Signed)
Important Message  Patient Details  Name: Barbara Hill MRN: [id number] Date of Birth: [birth year]   Medicare Important Message Given:  Yes     Renie Ora 01/29/2020, 3:33 PM

## 2020-01-29 NOTE — Progress Notes (Signed)
Pt assisted to bedside commode after sitting up in recliner for about an hour.  Nurse and NT noticed that left medial leg incision began "squirting across the room". Pressure applied and held for several minutes before wrapping with gauze dressing.  Pt assisted back to bed, Vital signs stable.  Vascular PA notified and came to bedside to assess.  Heparin gtt still running at this time.

## 2020-01-29 NOTE — Progress Notes (Signed)
Progress Note  Patient Name: Barbara Hill Date of Encounter: 01/29/2020  [facility] Cardiologist: Dorris Carnes, MD   Subjective   Patient still a little groggy from TEE   " I have to get to work"   Answers some questions  appropriately  Inpatient Medications    Scheduled Meds: . sodium chloride   Intravenous Once  . Chlorhexidine Gluconate Cloth  6 each Topical Daily  . clopidogrel  75 mg Oral Daily  . flecainide  50 mg Oral BID  . FLUoxetine  20 mg Oral BID  . metoprolol succinate  25 mg Oral QHS  . pantoprazole  40 mg Oral Daily  . sodium chloride flush  3 mL Intravenous Q12H   Continuous Infusions: . cefTRIAXone (ROCEPHIN)  IV Stopped (01/28/20 1851)  . heparin 1,050 Units/hr (01/29/20 0400)   PRN Meds:   Vital Signs    Vitals:   01/28/20 1611 01/28/20 2008 01/29/20 0020 01/29/20 0408  BP: (!) 149/91 (!) 159/71 127/71 138/79  Pulse: 75 65 64 65  Resp: 18 19 17    Temp: 98.2 F (36.8 C) 98.9 F (37.2 C) (!) 97 F (36.1 C) 98.5 F (36.9 C)  TempSrc: Oral Oral Axillary Oral  SpO2: 100% 100% 93% 96%  Weight:      Height:        Intake/Output Summary (Last 24 hours) at 01/29/2020 0714 Last data filed at 01/29/2020 0400 Gross per 24 hour  Intake 877.98 ml  Output --  Net 877.98 ml   Last 3 Weights 01/24/2020 01/03/2020 04/10/2019  Weight (lbs) 107 lb 107 lb 6.4 oz 105 lb 1.9 oz  Weight (kg) 48.535 kg 48.716 kg 47.682 kg      Telemetry    SR  - Personally Reviewed  ECG    Not done   - Personally Reviewed  Physical Exam   GEN: No acute distress.   Neck: JVP is normal   Cardiac: RRR, no murmurs, Respiratory: Clear to auscultation bilaterally. GI: Soft, nontender, non-distended  MS: No edema;   WOunds healing well  Neuro:  Nonfocal  Psych: Normal affect   Labs    High Sensitivity Troponin:  No results for input(s): TROPONINIHS in the last 720 hours.    Chemistry Recent Labs  Lab 01/27/20 0304 01/28/20 0603 01/29/20 0316  NA 136 137 138   K 3.3* 3.5 3.7  CL 100 102 101  CO2 25 27 28   GLUCOSE 109* 105* 101*  BUN 15 16 17   CREATININE 1.08* 1.07* 1.03*  CALCIUM 8.5* 8.7* 8.7*  PROT 5.6* 6.1* 5.9*  ALBUMIN 2.5* 2.5* 2.5*  AST 17 29 26   ALT 6 11 11   ALKPHOS 49 49 50  BILITOT 0.6 0.5 0.4  GFRNONAA 49* 50* 52*  GFRAA 57* 58* >60  ANIONGAP 11 8 9      Hematology Recent Labs  Lab 01/27/20 0304 01/27/20 0304 01/28/20 0603 01/28/20 1610 01/29/20 0316  WBC 11.1*  --  9.1  --  7.9  RBC 2.44*  --  2.43*  --  2.79*  HGB 7.0*   < > 6.8* 8.1* 8.0*  HCT 21.7*   < > 21.6* 25.4* 24.7*  MCV 88.9  --  88.9  --  88.5  MCH 28.7  --  28.0  --  28.7  MCHC 32.3  --  31.5  --  32.4  RDW 13.2  --  13.2  --  13.4  PLT 192  --  223  --  248   < > =  values in this interval not displayed.    BNPNo results for input(s): BNP, PROBNP in the last 168 hours.   DDimer No results for input(s): DDIMER in the last 168 hours.   Radiology    DG CHEST PORT 1 VIEW  Result Date: 01/28/2020 CLINICAL DATA:  Altered level of consciousness, shortness of breath, post BILATERAL popliteal artery embolectomy 01/26/2020 EXAM: PORTABLE CHEST 1 VIEW COMPARISON:  Portable exam 1030 hours compared to 03/23/2015 FINDINGS: Borderline enlargement of cardiac silhouette. Mediastinal contours and pulmonary vascularity normal. Emphysematous and bronchitic changes consistent with COPD. Minimal bibasilar atelectasis. Remaining lungs clear. No infiltrate, pleural effusion, or pneumothorax. Bones demineralized. IMPRESSION: Changes of COPD with minimal bibasilar atelectasis. Electronically Signed   By: Ulyses Southward M.D.   On: 01/28/2020 12:25    Cardiac Studies   TEE today    Patient Profile    Assessment & Plan   1  Popliteal emboli   Pt s/p embolectomy   Has been on IV heparin   Would transition to NOAC when OK with surgery TEE today did not show any obvious source    Would assume then still could have had PAF with embolic event  2  Hx of SVT   No arrhythmia  documented  Keep on current regmien  3  Hx HTN   BP labile   I would not push lower given hx of orthostasis in past     Pt was a little groggy this PM   Will reassess in AM    For questions or updates, please contact [facility] Please consult [email or web address] for contact info under        Signed, Dietrich Pates, MD  01/29/2020, 7:14 AM

## 2020-01-29 NOTE — Progress Notes (Signed)
Physical Therapy Treatment Patient Details Name: Barbara Hill MRN: [id number] DOB: 10/01/1940 Today's Date: 01/29/2020    History of Present Illness Patient is a 79 y/o female who presented with acute ischemic lower extremities now s/p bilateral popliteal exploration and embolectomy 01/25/20. 24 hours later developed compartment syndrome and went for bilateral 4 compartment fasciotomy 01/26/20.  Has some post op confusion.  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    PT Comments    Pt has come back for ECG and is eating supine in bed with daughter present. Advised pt she could eat better if she were up sitting in chair. With increased encouragement from daughter pt reluctantly agrees to get up to recliner. Pt is limited in safe mobility by anxiety and pain in presence of decreased strength and ROM. Pt requires modAx2 for bed mobility, attempted sit to stand in RW but unable to achieve due to increased pain in LE with weightbearing. Pt is maxAx2 for lateral pad assisted scoot to drop arm recliner on her R. D/c plans remain appropriate. PT will continue to follow acutely.   Follow Up Recommendations  CIR     Equipment Recommendations  Rolling walker with 5" wheels    Recommendations for Other Services Rehab consult     Precautions / Restrictions Precautions Precautions: Fall Restrictions Weight Bearing Restrictions: No    Mobility  Bed Mobility Overal bed mobility: Needs Assistance Bed Mobility: Supine to Sit;Sit to Supine     Supine to sit: Mod assist;HOB elevated    General bed mobility comments: increased multimodal cuing for sequencing of hand to bed rail and movement of LE to EoB, +2 for assist to scoot   Transfers Overall transfer level: Needs assistance Equipment used: Rolling walker (2 wheeled);2 person hand held assist Transfers: Sit to/from Stand;Lateral/Scoot Transfers Sit to Stand: Total assist;+2  physical assistance        Lateral/Scoot Transfers: Max assist;+2 physical assistance General transfer comment: total Ax2 for attempt to stand with RW, pain limiting with weightbearing, maxAx2 for lateral scoot transfer to drop arm recliner on her R        Balance Overall balance assessment: Needs assistance Sitting-balance support: Feet supported;Feet unsupported;Single extremity supported Sitting balance-Leahy Scale: Poor Sitting balance - Comments: leaning down on R elbow at times and cues to keep feet on the floor min A at times for balance                                    Cognition Arousal/Alertness: Awake/alert Behavior During Therapy: Anxious Overall Cognitive Status: Impaired/Different from baseline Area of Impairment: Attention;Following commands;Problem solving;Memory;Orientation                   Current Attention Level: Focused Memory: Decreased short-term memory Following Commands: Follows one step commands inconsistently;Follows one step commands with increased time     Problem Solving: Slow processing;Difficulty sequencing;Requires verbal cues;Requires tactile cues General Comments: pt aware today is her birthday, daughter is in room and assists with cuing          General Comments General comments (skin integrity, edema, etc.): VSS on RA, trickle of dried blood noted from R lateral incision      Pertinent Vitals/Pain Pain Assessment: Faces Faces Pain Scale: Hurts even more Pain Location: bilateral LE's L>R Pain Descriptors / Indicators: Aching;Grimacing;Discomfort Pain Intervention(s): Limited activity  within patient's tolerance;Monitored during session;Repositioned           PT Goals (current goals can now be found in the care plan section) Acute Rehab PT Goals Patient Stated Goal: to go home PT Goal Formulation: With patient/family Time For Goal Achievement: 02/10/20 Potential to Achieve Goals: Good Progress towards PT  goals: Progressing toward goals    Frequency    Min 3X/week      PT Plan Current plan remains appropriate       AM-PAC PT "6 Clicks" Mobility   Outcome Measure  Help needed turning from your back to your side while in a flat bed without using bedrails?: A Lot Help needed moving from lying on your back to sitting on the side of a flat bed without using bedrails?: A Lot Help needed moving to and from a bed to a chair (including a wheelchair)?: Total Help needed standing up from a chair using your arms (e.g., wheelchair or bedside chair)?: Total Help needed to walk in hospital room?: Total Help needed climbing 3-5 steps with a railing? : Total 6 Click Score: 8    End of Session Equipment Utilized During Treatment: Gait belt Activity Tolerance: Patient limited by pain Patient left: with call bell/phone within reach;in chair;with chair alarm set;with family/visitor present Nurse Communication: Mobility status PT Visit Diagnosis: Other abnormalities of gait and mobility (R26.89);Muscle weakness (generalized) (M62.81);Difficulty in walking, not elsewhere classified (R26.2);Pain Pain - Right/Left: Right(both) Pain - part of body: Leg     Time: 6283-1517 PT Time Calculation (min) (ACUTE ONLY): 27 min  Charges:  $Therapeutic Activity: 23-37 mins                     Lashawnda Hancox B. Migdalia Dk PT, DPT Acute Rehabilitation Services Pager [phone] Office [phone]    [place] 01/29/2020, 3:31 PM

## 2020-01-29 NOTE — Progress Notes (Signed)
PROGRESS NOTE    Barbara Hill  DUK:[id number] DOB: 09/01/1940 DOA: 01/24/2020 PCP: Aletha Halim., PA-C    Brief Narrative:  79 y.o. 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  History is obtained from the patient, but she seems intermittently confused and is not aware of the date and ask for her daughter who is not been by per nursing staff.  Her right leg pain started acutely and was severe.  A couple days prior she says that her heart was beating very fast for which EMS was called, but by their arrival was beating normally.  She had also had long-term Holter monitoring performed in March of this year which showed no significant arrhythmias.  She does admit to still smoking cigarettes, but reports that she will quit as she will related with the pain that she had in her right leg that brought her to the hospital.  ED Course: Upon admission into the emergency department patient was noted to be afebrile with a pressure elevated up to 217/84, heart rates intermittently fluctuating from 60-130, and all other vital signs maintained.  Labs are significant for hemoglobin 10, BUN 18, and creatinine 1.21.  CT scan of the abdomen pelvis revealed bilateral popliteal embolus.  Patient was taken immediately to the operating grading room by vascular surgery for bilateral popliteal embolectomy.  Assessment & Plan:   Principal Problem:   Ischemic leg Active Problems:   Paroxysmal SVT (supraventricular tachycardia) (HCC)   Tobacco use disorder   COPD (chronic obstructive pulmonary disease) (HCC)   Normocytic anemia   History of CVA (cerebrovascular accident)   Anxiety   CKD (chronic kidney disease), stage III  Bilateral ischemic legs: Acute.   -Pt with concerns of bilateral popliteal embolic  disease -Vascular Surgery consulted and patient underwent B popliteal exploration and embolectomy on 6/3 -Post-operatively, pt complained of increased LE pain and on re-evaluation by Vascular Surgery, decision made for 4 compartment fasciotomies, performed on 6/4 -per Vascular, OK to get out of bed  -Hgb down to 6.8 from 7.0 yesterday. Have ordered 1 unit PRBC.  -Pt is continued on heparin gtt per Vascular Surgery -Underwent TEE 6/7 with no embolic source identified  Paroxysmal SVT:  -On admission patient appeared to be in sinus rhythm.   -Cont with home flecainide 50mg  twice daily -Continue on heparin gtt as noted above  COPD/tobacco use:  -Patient without signs of acute exacerbation of her COPD.   -Reportedly declined nicotine patch on presentation -cont with Albuterol nebs as needed shortness of breath/wheezing -cont to recommend tobacco cessation  Memory deficit with acute toxicmetabolic encephalopathy  -Patient noted issues with memory and possibly confused at times.   -Recently noted to be oriented to place and seems to have insight to her current medical situation -CXR and UA ordered and reviewed. CXR with findings of COPD changes. UA with blood, leukocytes and elevated WBC's.  -Now on empiric rocephin. F/u urine cx, pending -This AM, mentation seems improved and more oriented  Normocytic anemia:  -Hemoglobin required one unit PRBC on 6/6 -Hgb stable, hemodynamically stable at this time  History of CVA:  -Patient with previous history of stroke following procedure in 2017. -No notable neurologic changes noted this AM  Essential hypertension:  -Home medications include losartan 25 mg daily and metoprolol 25 mg nightly -Continued on  metoprolol -BP stable at this time `  Chronic kidney disease stage IIIb: Patient presents with creatinine of 1.21 which appears within her baseline of 1.2-1.3. -repeat bmet in AM  Anxiety: Continue Xanax 0.5 mg 3 times daily as  needed and fluoxetine 20 mg twice daily -Continue Xanax and fluoxetine as tolerated  GERD: Home meds include omeprazole 20 mg daily -Continue with protonix as tolerated  DVT prophylaxis: Heparin gtt Code Status: Full Family Communication: Pt in room, family not at bedside  Status is: Inpatient  Remains inpatient appropriate because:Hemodynamically unstable, Ongoing diagnostic testing needed not appropriate for outpatient work up, IV treatments appropriate due to intensity of illness or inability to take PO and Inpatient level of care appropriate due to severity of illness   Dispo: The patient is from: Home              Anticipated d/c is to: CIR              Anticipated d/c date is: > 3 days, when cleared by Vascular              Patient currently is not medically stable to d/c.   Consultants:   Vascular Surgery  Procedures:  B popliteal exploration and embolectomy on 6/3 Emergent 4 compartment fasciotomy 6/4  Antimicrobials: Anti-infectives (From admission, onward)   Start     Dose/Rate Route Frequency Ordered Stop   01/28/20 1530  cefTRIAXone (ROCEPHIN) 1 g in sodium chloride 0.9 % 100 mL IVPB    Note to Pharmacy: Allergy list reviewed Per checklist, should be OK for cephalosporin   1 g 200 mL/hr over 30 Minutes Intravenous Every 24 hours 01/28/20 1358     01/26/20 0918  ceFAZolin (ANCEF) 2-4 GM/100ML-% IVPB    Note to Pharmacy: Evern [employer]   : cabinet override      01/26/20 0918 01/26/20 2129      Subjective: More confused this AM  Objective: Vitals:   01/29/20 1046 01/29/20 1239 01/29/20 1249 01/29/20 1322  BP: (!) 175/81 (!) 155/56 (!) 151/47 (!) 155/110  Pulse: 65 66 64 64  Resp: 16 18 18 13   Temp: (!) 97.5 F (36.4 C) 97.9 F (36.6 C)  98 F (36.7 C)  TempSrc: Temporal Temporal  Axillary  SpO2: 99% 100% 98% 99%  Weight: 48.1 kg     Height: 5\' 3"  (1.6 m)       Intake/Output Summary (Last 24 hours) at 01/29/2020 1453 Last data filed at 01/29/2020  1234 Gross per 24 hour  Intake 862.98 ml  Output 450 ml  Net 412.98 ml   Filed Weights   01/24/20 2115 01/29/20 1046  Weight: 48.5 kg 48.1 kg    Examination: General exam: Awake, laying in bed, in nad Respiratory system: Normal respiratory effort, no wheezing Cardiovascular system: regular rate, s1, s2 Gastrointestinal system: Soft, nondistended, positive BS Central nervous system: CN2-12 grossly intact, strength intact Extremities: Perfused, no clubbing Skin: Normal skin turgor, no notable skin lesions seen Psychiatry: Mood normal // no visual hallucinations   Data Reviewed: I have personally reviewed following labs and imaging studies  CBC: Recent Labs  Lab 01/24/20 2200 01/24/20 2200 01/26/20 0315 01/27/20 0304 01/28/20 0603 01/28/20 1610 01/29/20 0316  WBC 6.1  --  10.1 11.1* 9.1  --  7.9  NEUTROABS 2.5  --   --   --   --   --   --   HGB 10.0*   < > 8.1* 7.0* 6.8* 8.1* 8.0*  HCT 31.5*   < > 25.5* 21.7* 21.6* 25.4* 24.7*  MCV 88.5  --  87.6 88.9 88.9  --  88.5  PLT 243  --  232 192 223  --  248   < > = values in this interval not displayed.   Basic Metabolic Panel: Recent Labs  Lab 01/24/20 2200 01/26/20 0315 01/27/20 0304 01/28/20 0603 01/29/20 0316  NA 140 137 136 137 138  K 3.9 3.4* 3.3* 3.5 3.7  CL 104 100 100 102 101  CO2 28 28 25 27 28   GLUCOSE 102* 133* 109* 105* 101*  BUN 18 14 15 16 17   CREATININE 1.21* 1.15* 1.08* 1.07* 1.03*  CALCIUM 8.8* 8.5* 8.5* 8.7* 8.7*  MG  --   --   --  1.3* 2.3   GFR: Estimated Creatinine Clearance: 34.2 mL/min (A) (by C-G formula based on SCr of 1.03 mg/dL (H)). Liver Function Tests: Recent Labs  Lab 01/27/20 0304 01/28/20 0603 01/29/20 0316  AST 17 29 26   ALT 6 11 11   ALKPHOS 49 49 50  BILITOT 0.6 0.5 0.4  PROT 5.6* 6.1* 5.9*  ALBUMIN 2.5* 2.5* 2.5*   No results for input(s): LIPASE, AMYLASE in the last 168 hours. No results for input(s): AMMONIA in the last 168 hours. Coagulation Profile: Recent  Labs  Lab 01/29/20 0835  INR 1.3*   Cardiac Enzymes: No results for input(s): CKTOTAL, CKMB, CKMBINDEX, TROPONINI in the last 168 hours. BNP (last 3 results) No results for input(s): PROBNP in the last 8760 hours. HbA1C: No results for input(s): HGBA1C in the last 72 hours. CBG: No results for input(s): GLUCAP in the last 168 hours. Lipid Profile: No results for input(s): CHOL, HDL, LDLCALC, TRIG, CHOLHDL, LDLDIRECT in the last 72 hours. Thyroid Function Tests: No results for input(s): TSH, T4TOTAL, FREET4, T3FREE, THYROIDAB in the last 72 hours. Anemia Panel: No results for input(s): VITAMINB12, FOLATE, FERRITIN, TIBC, IRON, RETICCTPCT in the last 72 hours. Sepsis Labs: No results for input(s): PROCALCITON, LATICACIDVEN in the last 168 hours.  Recent Results (from the past 240 hour(s))  SARS Coronavirus 2 by RT PCR (hospital order, performed in [facility] hospital lab) Nasopharyngeal Nasopharyngeal Swab     Status: None   Collection Time: 01/25/20  4:23 AM   Specimen: Nasopharyngeal Swab  Result Value Ref Range Status   SARS Coronavirus 2 NEGATIVE NEGATIVE Final    Comment: (NOTE) SARS-CoV-2 target nucleic acids are NOT DETECTED. The SARS-CoV-2 RNA is generally detectable in upper and lower respiratory specimens during the acute phase of infection. The lowest concentration of SARS-CoV-2 viral copies this assay can detect is 250 copies / mL. A negative result does not preclude SARS-CoV-2 infection and should not be used as the sole basis for treatment or other patient management decisions.  A negative result may occur with improper specimen collection / handling, submission of specimen other than nasopharyngeal swab, presence of viral mutation(s) within the areas targeted by this assay, and inadequate number of viral copies (<250 copies / mL). A negative result must be combined with clinical observations, patient history, and epidemiological information. Fact Sheet for  Patients:   Fact Sheet for Healthcare Providers: 03/30/20 This test is not yet approved or cleared  by the [facility] FDA and has been authorized for detection and/or diagnosis of SARS-CoV-2 by FDA under an Emergency Use Authorization (EUA).  This EUA will remain in effect (meaning this test can be used) for the duration of the COVID-19 declaration under  Section 564(b)(1) of the Act, 21 U.S.C. section 360bbb-3(b)(1), unless the authorization is terminated or revoked sooner. Performed at [facility], [address], [place], [place] [address]   Culture, Urine     Status: None (Preliminary result)   Collection Time: 01/28/20  1:57 PM   Specimen: Urine, Random  Result Value Ref Range Status   Specimen Description URINE, RANDOM  Final   Special Requests NONE  Final   Culture   Final    CULTURE REINCUBATED FOR BETTER GROWTH Performed at [facility], [address], [place], [place] [address]    Report Status PENDING  Incomplete     Radiology Studies: DG CHEST PORT 1 VIEW  Result Date: 01/28/2020 CLINICAL DATA:  Altered level of consciousness, shortness of breath, post BILATERAL popliteal artery embolectomy 01/26/2020 EXAM: PORTABLE CHEST 1 VIEW COMPARISON:  Portable exam 1030 hours compared to 03/23/2015 FINDINGS: Borderline enlargement of cardiac silhouette. Mediastinal contours and pulmonary vascularity normal. Emphysematous and bronchitic changes consistent with COPD. Minimal bibasilar atelectasis. Remaining lungs clear. No infiltrate, pleural effusion, or pneumothorax. Bones demineralized. IMPRESSION: Changes of COPD with minimal bibasilar atelectasis. Electronically Signed   By: Ulyses Southward M.D.   On: 01/28/2020 12:25    Scheduled Meds: . sodium chloride   Intravenous Once  . Chlorhexidine Gluconate Cloth  6 each Topical Daily  . clopidogrel  75 mg Oral Daily  . flecainide  50 mg Oral BID    . FLUoxetine  20 mg Oral BID  . metoprolol succinate  25 mg Oral QHS  . pantoprazole  40 mg Oral Daily  . sodium chloride flush  3 mL Intravenous Q12H   Continuous Infusions: . cefTRIAXone (ROCEPHIN)  IV Stopped (01/28/20 1851)  . heparin 1,050 Units/hr (01/29/20 1212)     LOS: 4 days   Rickey Barbara, MD [facility] Pager On Amion  If 7PM-7AM, please contact night-coverage 01/29/2020, 2:53 PM

## 2020-01-29 NOTE — Plan of Care (Signed)
°  Problem: Education: Goal: Knowledge of General Education information will improve Description: Including pain rating scale, medication(s)/side effects and non-pharmacologic comfort measures Outcome: Not Progressing   Problem: Clinical Measurements: Goal: Ability to maintain clinical measurements within normal limits will improve Outcome: Not Progressing Goal: Will remain free from infection Outcome: Not Progressing Goal: Diagnostic test results will improve Outcome: Not Progressing Goal: Respiratory complications will improve Outcome: Not Progressing Goal: Cardiovascular complication will be avoided Outcome: Not Progressing   Problem: Activity: Goal: Risk for activity intolerance will decrease Outcome: Not Progressing   Problem: Coping: Goal: Level of anxiety will decrease Outcome: Not Progressing   Problem: Elimination: Goal: Will not experience complications related to bowel motility Outcome: Not Progressing Goal: Will not experience complications related to urinary retention Outcome: Not Progressing   Problem: Pain Managment: Goal: General experience of comfort will improve Outcome: Not Progressing

## 2020-01-29 NOTE — Progress Notes (Signed)
  Echocardiogram Echocardiogram Transesophageal has been performed.  Celene Skeen 01/29/2020, 12:39 PM

## 2020-01-29 NOTE — TOC Benefit Eligibility Note (Signed)
Transition of Care [facility]) Benefit Eligibility Note    Patient Details  Name: Barbara Hill MRN: [id number] Date of Birth: 06-03-1941   Medication/Dose: Xarelto 62m daily and Eliquis 567mbid.  Covered?: Yes  Tier: 3 Drug  Prescription Coverage [facility] Person/Company/Phone Number:: Barbara OlszewskiW[facility] Help Desk Ph# [phone]-Pay: $47.00 after deductible has been met  Prior Approval: No  Deductible: Unmet       Barbara Hill Number: 01/29/2020, 1:15 PM

## 2020-01-29 NOTE — Progress Notes (Signed)
Progress Note    01/29/2020 4:02 PM Day of Surgery  Subjective:  Called to beside regarding bleeding from LLE medial fasciotomy incision when patient OOB to bathroom.   She is still groggy from TEE. In NAD.  Vitals:   01/29/20 1249 01/29/20 1322  BP: (!) 151/47 (!) 155/110  Pulse: 64 64  Resp: 18 13  Temp:  98 F (36.7 C)  SpO2: 98% 99%    Physical Exam: Cardiac:  RRR Lungs:  nonlabored Incisions:  Bulky dressing just applied to proximal left lower leg before my arrival. It is dry. Popliteal space and calf soft. 1+ pitting edema. Small area of fullness at distal aspect of incision Extremities:  Palp right DP and left PT pulses   CBC    Component Value Date/Time   WBC 7.9 01/29/2020 0316   RBC 2.79 (L) 01/29/2020 0316   HGB 8.0 (L) 01/29/2020 0316   HGB 10.6 (L) 04/10/2019 1047   HCT 24.7 (L) 01/29/2020 0316   HCT 31.9 (L) 04/10/2019 1047   PLT 248 01/29/2020 0316   PLT 308 04/10/2019 1047   MCV 88.5 01/29/2020 0316   MCV 91 04/10/2019 1047   MCV 92 10/23/2013 0803   MCH 28.7 01/29/2020 0316   MCHC 32.4 01/29/2020 0316   RDW 13.4 01/29/2020 0316   RDW 11.3 (L) 04/10/2019 1047   RDW 14.0 10/23/2013 0803   LYMPHSABS 2.6 01/24/2020 2200   LYMPHSABS 2.4 10/23/2013 0803   MONOABS 0.7 01/24/2020 2200   MONOABS 0.7 10/23/2013 0803   EOSABS 0.3 01/24/2020 2200   EOSABS 0.3 10/23/2013 0803   BASOSABS 0.0 01/24/2020 2200   BASOSABS 0.1 10/23/2013 0803    BMET    Component Value Date/Time   NA 138 01/29/2020 0316   NA 139 04/10/2019 1047   NA 140 10/23/2013 0803   K 3.7 01/29/2020 0316   K 4.6 10/23/2013 0803   CL 101 01/29/2020 0316   CL 107 10/23/2013 0803   CO2 28 01/29/2020 0316   CO2 28 10/23/2013 0803   GLUCOSE 101 (H) 01/29/2020 0316   GLUCOSE 87 10/23/2013 0803   BUN 17 01/29/2020 0316   BUN 19 04/10/2019 1047   BUN 6 (L) 10/23/2013 0803   CREATININE 1.03 (H) 01/29/2020 0316   CREATININE 0.66 07/24/2016 1125   CALCIUM 8.7 (L) 01/29/2020 0316    CALCIUM 8.8 10/23/2013 0803   GFRNONAA 52 (L) 01/29/2020 0316   GFRNONAA >60 10/23/2013 0803   GFRAA >60 01/29/2020 0316   GFRAA >60 10/23/2013 0803     Intake/Output Summary (Last 24 hours) at 01/29/2020 1602 Last data filed at 01/29/2020 1234 Gross per 24 hour  Intake 812.98 ml  Output 450 ml  Net 362.98 ml    HOSPITAL MEDICATIONS Scheduled Meds: . sodium chloride   Intravenous Once  . Chlorhexidine Gluconate Cloth  6 each Topical Daily  . clopidogrel  75 mg Oral Daily  . flecainide  50 mg Oral BID  . FLUoxetine  20 mg Oral BID  . metoprolol succinate  25 mg Oral QHS  . pantoprazole  40 mg Oral Daily  . sodium chloride flush  3 mL Intravenous Q12H   Continuous Infusions: . cefTRIAXone (ROCEPHIN)  IV Stopped (01/28/20 1851)  . heparin 1,050 Units/hr (01/29/20 1212)   PRN Meds:.acetaminophen **OR** acetaminophen, ALPRAZolam, morphine injection, ondansetron **OR** ondansetron (ZOFRAN) IV, oxyCODONE  Assessment: Bleeding from LLE medial incision when OOB.  Bulky dressing placed.  Towel used to wipe up blood maybe 30 cc blood  stain.  Plan: -No apparent active bleeding at this time. Continue to monitor -DVT prophylaxis: heparin gtt   Wendi Maya, PA-C [facility] [phone] 01/29/2020  4:02 PM

## 2020-01-29 NOTE — Progress Notes (Signed)
  Progress Note    01/29/2020 6:19 PM Day of Surgery  Subjective: patient resting comfortably in bed. Still somewhat drowsy after TEE. Says some pain in left leg   Vitals:   01/29/20 1322 01/29/20 1543  BP: (!) 155/110 133/61  Pulse: 64 (!) 59  Resp: 13 18  Temp: 98 F (36.7 C) 98.2 F (36.8 C)  SpO2: 99% 100%   Physical Exam: Cardiac: regular Lungs: non labored Incisions:  Left lower leg dressings clean, dry and intact. Did not remove. Calf soft. No active bleeding Extremities: bilateral legs warm and well perfused. Palpable right DP and left PT pulses  CBC    Component Value Date/Time   WBC 7.9 01/29/2020 0316   RBC 2.79 (L) 01/29/2020 0316   HGB 8.0 (L) 01/29/2020 0316   HGB 10.6 (L) 04/10/2019 1047   HCT 24.7 (L) 01/29/2020 0316   HCT 31.9 (L) 04/10/2019 1047   PLT 248 01/29/2020 0316   PLT 308 04/10/2019 1047   MCV 88.5 01/29/2020 0316   MCV 91 04/10/2019 1047   MCV 92 10/23/2013 0803   MCH 28.7 01/29/2020 0316   MCHC 32.4 01/29/2020 0316   RDW 13.4 01/29/2020 0316   RDW 11.3 (L) 04/10/2019 1047   RDW 14.0 10/23/2013 0803   LYMPHSABS 2.6 01/24/2020 2200   LYMPHSABS 2.4 10/23/2013 0803   MONOABS 0.7 01/24/2020 2200   MONOABS 0.7 10/23/2013 0803   EOSABS 0.3 01/24/2020 2200   EOSABS 0.3 10/23/2013 0803   BASOSABS 0.0 01/24/2020 2200   BASOSABS 0.1 10/23/2013 0803    BMET    Component Value Date/Time   NA 138 01/29/2020 0316   NA 139 04/10/2019 1047   NA 140 10/23/2013 0803   K 3.7 01/29/2020 0316   K 4.6 10/23/2013 0803   CL 101 01/29/2020 0316   CL 107 10/23/2013 0803   CO2 28 01/29/2020 0316   CO2 28 10/23/2013 0803   GLUCOSE 101 (H) 01/29/2020 0316   GLUCOSE 87 10/23/2013 0803   BUN 17 01/29/2020 0316   BUN 19 04/10/2019 1047   BUN 6 (L) 10/23/2013 0803   CREATININE 1.03 (H) 01/29/2020 0316   CREATININE 0.66 07/24/2016 1125   CALCIUM 8.7 (L) 01/29/2020 0316   CALCIUM 8.8 10/23/2013 0803   GFRNONAA 52 (L) 01/29/2020 0316   GFRNONAA >60  10/23/2013 0803   GFRAA >60 01/29/2020 0316   GFRAA >60 10/23/2013 0803    INR    Component Value Date/Time   INR 1.3 (H) 01/29/2020 0835   INR 1.1 10/22/2013 0125     Intake/Output Summary (Last 24 hours) at 01/29/2020 1819 Last data filed at 01/29/2020 1300 Gross per 24 hour  Intake 1052.98 ml  Output 450 ml  Net 602.98 ml     Assessment/Plan:  79 y.o. female is s/p BLE fasciotomies POD #3. Concerns about active bleeding from left leg medial fasciotomy incision. Dressings clean dry and intact. Did not remove. Will take down dressings tomorrow morning. Patient otherwise hemodynamically stable. Continue to monitor  DVT prophylaxis: heparin gtt   Graceann Congress, PA-C [facility] [phone] 01/29/2020 6:19 PM

## 2020-01-29 NOTE — Anesthesia Postprocedure Evaluation (Signed)
Anesthesia Post Note  Patient: Barbara Hill  Procedure(s) Performed: TRANSESOPHAGEAL ECHOCARDIOGRAM (TEE) (N/A ) BUBBLE STUDY     Patient location during evaluation: PACU Anesthesia Type: MAC Level of consciousness: awake and alert Pain management: pain level controlled Vital Signs Assessment: post-procedure vital signs reviewed and stable Respiratory status: spontaneous breathing and respiratory function stable Cardiovascular status: stable Postop Assessment: no apparent nausea or vomiting Anesthetic complications: no    Last Vitals:  Vitals:   01/29/20 1239 01/29/20 1249  BP: (!) 155/56 (!) 151/47  Pulse: 66 64  Resp: 18 18  Temp: 36.6 C   SpO2: 100% 98%    Last Pain:  Vitals:   01/29/20 1249  TempSrc:   PainSc: 0-No pain                 Layli Capshaw DANIEL

## 2020-01-29 NOTE — Progress Notes (Addendum)
.  Progress Note    01/29/2020 7:06 AM 3 Days Post-Op  Subjective:  Says her legs don't feel too bad and that she did not have any pain medication last night.  She was speaking about when she saw Dr. Donnetta Hutching with her husband (Dr. Donnetta Hutching has operated on her husband in the past).  Wants to go home.   Afebrile HR 50's-60's NSR 161'W-960'A systolic 54% [id number]  Vitals:   01/29/20 0020 01/29/20 0408  BP: 127/71 138/79  Pulse: 64 65  Resp: 17   Temp: (!) 97 F (36.1 C) 98.5 F (36.9 C)  SpO2: 93% 96%    Physical Exam: Cardiac:  regular Lungs:  Non labored Incisions:  BLE leg incisions look good with staples in tact; no active bleeding.  Extremities:  Palpable right DP pulse and left PT pulse   CBC    Component Value Date/Time   WBC 7.9 01/29/2020 0316   RBC 2.79 (L) 01/29/2020 0316   HGB 8.0 (L) 01/29/2020 0316   HGB 10.6 (L) 04/10/2019 1047   HCT 24.7 (L) 01/29/2020 0316   HCT 31.9 (L) 04/10/2019 1047   PLT 248 01/29/2020 0316   PLT 308 04/10/2019 1047   MCV 88.5 01/29/2020 0316   MCV 91 04/10/2019 1047   MCV 92 10/23/2013 0803   MCH 28.7 01/29/2020 0316   MCHC 32.4 01/29/2020 0316   RDW 13.4 01/29/2020 0316   RDW 11.3 (L) 04/10/2019 1047   RDW 14.0 10/23/2013 0803   LYMPHSABS 2.6 01/24/2020 2200   LYMPHSABS 2.4 10/23/2013 0803   MONOABS 0.7 01/24/2020 2200   MONOABS 0.7 10/23/2013 0803   EOSABS 0.3 01/24/2020 2200   EOSABS 0.3 10/23/2013 0803   BASOSABS 0.0 01/24/2020 2200   BASOSABS 0.1 10/23/2013 0803    BMET    Component Value Date/Time   NA 138 01/29/2020 0316   NA 139 04/10/2019 1047   NA 140 10/23/2013 0803   K 3.7 01/29/2020 0316   K 4.6 10/23/2013 0803   CL 101 01/29/2020 0316   CL 107 10/23/2013 0803   CO2 28 01/29/2020 0316   CO2 28 10/23/2013 0803   GLUCOSE 101 (H) 01/29/2020 0316   GLUCOSE 87 10/23/2013 0803   BUN 17 01/29/2020 0316   BUN 19 04/10/2019 1047   BUN 6 (L) 10/23/2013 0803   CREATININE 1.03 (H) 01/29/2020 0316    CREATININE 0.66 07/24/2016 1125   CALCIUM 8.7 (L) 01/29/2020 0316   CALCIUM 8.8 10/23/2013 0803   GFRNONAA 52 (L) 01/29/2020 0316   GFRNONAA >60 10/23/2013 0803   GFRAA >60 01/29/2020 0316   GFRAA >60 10/23/2013 0803    INR    Component Value Date/Time   INR 1.11 11/12/2016 0953   INR 1.1 10/22/2013 0125     Intake/Output Summary (Last 24 hours) at 01/29/2020 0706 Last data filed at 01/29/2020 0400 Gross per 24 hour  Intake 877.98 ml  Output --  Net 877.98 ml     Assessment:  79 y.o. female is s/p:  Bilateral popliteal exploration and embolectomy 4 Days Post-Op And Bilateral lower extremity 4 compartment fasciotomy 3 Days Post-Op  Plan: -doing well from vascular standpoint.  pt with palpable right DP and left PT -pt A&O this am and is alert to year, place and president.  -acute blood loss anemia - received one unit blood yesterday.  No active bleeding of BLE incisions.   Pt did have blood in urine on u/a.  U/a cx pending.  -DVT prophylaxis:  Heparin gtt   Doreatha Massed, PA-C [facility] [phone] 01/29/2020 7:06 AM  I have examined the patient, reviewed and agree with above.  Oriented to me this morning.  Her daughter is in the room with her today as well.  Palpable dorsalis pedis pulses bilaterally.  Still with surgical soreness in her calves.  Cardiac work-up continues for possible embolic source.  Gretta Began, MD 01/29/2020 11:11 AM

## 2020-01-29 NOTE — Anesthesia Procedure Notes (Signed)
Procedure Name: MAC Date/Time: 01/29/2020 11:48 AM Performed by: Janene Harvey, CRNA Pre-anesthesia Checklist: Patient identified, Emergency Drugs available, Suction available and Patient being monitored Oxygen Delivery Method: Nasal cannula Placement Confirmation: positive ETCO2 Dental Injury: Teeth and Oropharynx as per pre-operative assessment

## 2020-01-29 NOTE — Transfer of Care (Signed)
Immediate Anesthesia Transfer of Care Note  Patient: Barbara Hill  Procedure(s) Performed: TRANSESOPHAGEAL ECHOCARDIOGRAM (TEE) (N/A ) BUBBLE STUDY  Patient Location: Endoscopy Unit  Anesthesia Type:MAC  Level of Consciousness: drowsy  Airway & Oxygen Therapy: Patient Spontanous Breathing and Patient connected to nasal cannula oxygen  Post-op Assessment: Report given to RN and Post -op Vital signs reviewed and stable  Post vital signs: Reviewed  Last Vitals:  Vitals Value Taken Time  BP 155/56 01/29/20 1239  Temp 36.6 C 01/29/20 1239  Pulse 65 01/29/20 1240  Resp 21 01/29/20 1240  SpO2 100 % 01/29/20 1240  Vitals shown include unvalidated device data.  Last Pain:  Vitals:   01/29/20 1239  TempSrc: Temporal  PainSc: Asleep         Complications: No apparent anesthesia complications

## 2020-01-29 NOTE — Interval H&P Note (Signed)
History and Physical Interval Note:  01/29/2020 11:39 AM  Barbara Hill  has presented today for surgery, with the diagnosis of RULE OUT CLOT.  The various methods of treatment have been discussed with the patient and family. After consideration of risks, benefits and other options for treatment, the patient has consented to  Procedure(s): TRANSESOPHAGEAL ECHOCARDIOGRAM (TEE) (N/A) as a surgical intervention.  The patient's history has been reviewed, patient examined, no change in status, stable for surgery.  I have reviewed the patient's chart and labs.  Questions were answered to the patient's satisfaction.     Parke Poisson

## 2020-01-29 NOTE — Anesthesia Preprocedure Evaluation (Signed)
Anesthesia Evaluation    Reviewed: Allergy & Precautions, H&P , Patient's Chart, lab work & pertinent test results, reviewed documented beta blocker date and time   History of Anesthesia Complications Negative for: history of anesthetic complications  Airway Mallampati: II   Neck ROM: full    Dental   Pulmonary COPD, Current Smoker and Patient abstained from smoking.,    breath sounds clear to auscultation       Cardiovascular hypertension, Pt. on medications and Pt. on home beta blockers + Peripheral Vascular Disease  + dysrhythmias Supra Ventricular Tachycardia  Rhythm:regular Rate:Normal  2017 tte:  Left ventricle: The cavity size was normal. Wall thickness was  increased in a pattern of mild LVH. Systolic function was normal.  The estimated ejection fraction was in the range of 60% to 65%.  Wall motion was normal; there were no regional wall motion  abnormalities. Features are consistent with a pseudonormal left  ventricular filling pattern, with concomitant abnormal relaxation  and increased filling pressure (grade 2 diastolic dysfunction).  - Aortic valve: There was mild regurgitation.  - Mitral valve: There was mild regurgitation.  - Left atrium: The atrium was mildly dilated.  - Right atrium: Central venous pressure (est): 3 mm Hg.  - Tricuspid valve: There was trivial regurgitation.  - Pulmonary arteries: Systolic pressure could not be accurately  estimated.  - Pericardium, extracardiac: There was no pericardial effusion.    Neuro/Psych  Headaches, PSYCHIATRIC DISORDERS Anxiety  Neuromuscular disease CVA    GI/Hepatic GERD  Medicated and Controlled,  Endo/Other  negative endocrine ROS  Renal/GU Renal InsufficiencyRenal disease     Musculoskeletal   Abdominal   Peds  Hematology  (+) Blood dyscrasia, anemia , Plavix   Anesthesia Other Findings   Reproductive/Obstetrics                              Anesthesia Physical  Anesthesia Plan  ASA: III  Anesthesia Plan: MAC   Post-op Pain Management:    Induction: Intravenous  PONV Risk Score and Plan: 2 and Ondansetron and Dexamethasone  Airway Management Planned: Natural Airway and Mask  Additional Equipment: None  Intra-op Plan:   Post-operative Plan: Post-operative intubation/ventilation  Informed Consent:   Plan Discussed with: Anesthesiologist and CRNA  Anesthesia Plan Comments:         Anesthesia Quick Evaluation

## 2020-01-30 LAB — COMPREHENSIVE METABOLIC PANEL
ALT: 11 U/L (ref 0–44)
AST: 19 U/L (ref 15–41)
Albumin: 2.4 g/dL — ABNORMAL LOW (ref 3.5–5.0)
Alkaline Phosphatase: 45 U/L (ref 38–126)
Anion gap: 10 (ref 5–15)
BUN: 18 mg/dL (ref 8–23)
CO2: 25 mmol/L (ref 22–32)
Calcium: 8.7 mg/dL — ABNORMAL LOW (ref 8.9–10.3)
Chloride: 102 mmol/L (ref 98–111)
Creatinine, Ser: 0.96 mg/dL (ref 0.44–1.00)
GFR calc Af Amer: >60 mL/min (ref >60)
GFR calc non Af Amer: 57 mL/min — ABNORMAL LOW (ref >60)
Glucose, Bld: 99 mg/dL (ref 70–99)
Potassium: 3.3 mmol/L — ABNORMAL LOW (ref 3.5–5.1)
Sodium: 137 mmol/L (ref 135–145)
Total Bilirubin: 0.6 mg/dL (ref 0.3–1.2)
Total Protein: 5.7 g/dL — ABNORMAL LOW (ref 6.5–8.1)

## 2020-01-30 LAB — CBC
HCT: 22.5 % — ABNORMAL LOW (ref 36.0–46.0)
Hemoglobin: 7.3 g/dL — ABNORMAL LOW (ref 12.0–15.0)
MCH: 29.1 pg (ref 26.0–34.0)
MCHC: 32.4 g/dL (ref 30.0–36.0)
MCV: 89.6 fL (ref 80.0–100.0)
Platelets: 273 10*3/uL (ref 150–400)
RBC: 2.51 MIL/uL — ABNORMAL LOW (ref 3.87–5.11)
RDW: 13.7 % (ref 11.5–15.5)
WBC: 8.4 10*3/uL (ref 4.0–10.5)
nRBC: 0 % (ref 0.0–0.2)

## 2020-01-30 LAB — URINE CULTURE: Culture: 20000 — AB

## 2020-01-30 LAB — HEPARIN LEVEL (UNFRACTIONATED)
Heparin Unfractionated: 0.72 IU/mL — ABNORMAL HIGH (ref 0.30–0.70)
Heparin Unfractionated: 0.31 IU/mL (ref 0.30–0.70)

## 2020-01-30 MED ORDER — POTASSIUM CHLORIDE CRYS ER 20 MEQ PO TBCR
60.0000 meq | EXTENDED_RELEASE_TABLET | Freq: Once | ORAL | Status: AC
  Administered 2020-01-30: 60 meq via ORAL
  Filled 2020-01-30: qty 3

## 2020-01-30 MED ORDER — FLUCONAZOLE 150 MG PO TABS
150.0000 mg | ORAL_TABLET | Freq: Once | ORAL | Status: AC
  Administered 2020-01-30: 150 mg via ORAL
  Filled 2020-01-30: qty 1

## 2020-01-30 MED ORDER — AMOXICILLIN 500 MG PO CAPS
500.0000 mg | ORAL_CAPSULE | Freq: Three times a day (TID) | ORAL | Status: DC
  Administered 2020-01-30 – 2020-02-06 (×22): 500 mg via ORAL
  Filled 2020-01-30 (×24): qty 1

## 2020-01-30 NOTE — Progress Notes (Signed)
PROGRESS NOTE    Barbara Hill  LOV:[id number] DOB: 06/23/1941 DOA: 01/24/2020 PCP: Aletha Halim., PA-C    Brief Narrative:  79 y.o. 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  History is obtained from the patient, but she seems intermittently confused and is not aware of the date and ask for her daughter who is not been by per nursing staff.  Her right leg pain started acutely and was severe.  A couple days prior she says that her heart was beating very fast for which EMS was called, but by their arrival was beating normally.  She had also had long-term Holter monitoring performed in March of this year which showed no significant arrhythmias.  She does admit to still smoking cigarettes, but reports that she will quit as she will related with the pain that she had in her right leg that brought her to the hospital.  ED Course: Upon admission into the emergency department patient was noted to be afebrile with a pressure elevated up to 217/84, heart rates intermittently fluctuating from 60-130, and all other vital signs maintained.  Labs are significant for hemoglobin 10, BUN 18, and creatinine 1.21.  CT scan of the abdomen pelvis revealed bilateral popliteal embolus.  Patient was taken immediately to the operating grading room by vascular surgery for bilateral popliteal embolectomy.  Assessment & Plan:   Principal Problem:   Ischemic leg Active Problems:   Paroxysmal SVT (supraventricular tachycardia) (HCC)   Tobacco use disorder   COPD (chronic obstructive pulmonary disease) (HCC)   Normocytic anemia   History of CVA (cerebrovascular accident)   Anxiety   CKD (chronic kidney disease), stage III  Bilateral ischemic legs: Acute.   -Pt with concerns of bilateral popliteal embolic  disease -Vascular Surgery consulted and patient underwent B popliteal exploration and embolectomy on 6/3 -Post-operatively, pt complained of increased LE pain and on re-evaluation by Vascular Surgery, decision made for 4 compartment fasciotomies, performed on 6/4 -per Vascular, OK to get out of bed  -Received 1 unit PRBC thus far, see below -Hgb currently 7.3 -Pt is continued on heparin gtt per Vascular Surgery -Underwent TEE 6/7 with no embolic source identified  Paroxysmal SVT:  -On admission patient appeared to be in sinus rhythm.   -Cont with home flecainide 50mg  twice daily -Stable at this time -Continue on heparin gtt as noted above as tolerated  COPD/tobacco use:  -Patient without signs of acute exacerbation of her COPD.   -Reportedly declined nicotine patch on presentation -cont with Albuterol nebs as needed shortness of breath/wheezing -cont to recommend tobacco cessation  Memory deficit with acute toxicmetabolic encephalopathy secondary to enterococcus UTI -Patient noted issues with memory and possibly confused at times.   -Recently noted to be more encephalopathic -CXR and UA ordered and reviewed. CXR with findings of COPD changes. UA with blood, leukocytes and elevated WBC's.  -Urine cx pos for enterococcus species -Was on empiric rocephin. Will transition to amoxicillin. Will also give dose of diflucan given hx of yeast infections with abx -This AM, mentation seems improved and more oriented, confirmed by daughter in room  Acute blood loss anemia likely secondary to hematuria:  -Hemoglobin required one unit PRBC on 6/6 -Hgb currently 7.3 from 8 -Large blood on UA. Have discussed with Urology on call. Pt is currently treated for enterococcus  UTI which is most likely the culprit for hematuria, per Urology. Urology recommendation to cont to treat for enterococcus UTI and monitor for clot formation resulting in bladder outlet obstruction. Ultimately when discharged,  recommendation to follow up with pt's primary Urologist, Dr. Sherron MondayMacDiarmid for hematuria work up.  History of CVA:  -Patient with previous history of stroke following procedure in 2017. -No notable neurologic changes noted this AM  Essential hypertension:  -Home medications include losartan 25 mg daily and metoprolol 25 mg nightly -Continued on metoprolol -BP stable currently  Chronic kidney disease stage IIIb: Patient presents with creatinine of 1.21 which appears within her baseline of 1.2-1.3. -Renal function much improved -recheck bmet in AM  Anxiety: Continue Xanax 0.5 mg 3 times daily as needed and fluoxetine 20 mg twice daily -Continue Xanax and fluoxetine as tolerated  GERD: Home meds include omeprazole 20 mg daily -Continue with protonix as pt tolerates  DVT prophylaxis: Heparin gtt Code Status: Full Family Communication: Pt in room, family currently at bedside  Status is: Inpatient  Remains inpatient appropriate because:Hemodynamically unstable, Ongoing diagnostic testing needed not appropriate for outpatient work up, IV treatments appropriate due to intensity of illness or inability to take PO and Inpatient level of care appropriate due to severity of illness   Dispo: The patient is from: Home              Anticipated d/c is to: CIR              Anticipated d/c date is: > 3 days, when cleared by Vascular              Patient currently is not medically stable to d/c.   Consultants:   Vascular Surgery  Discussed with urology  Procedures:  B popliteal exploration and embolectomy on 6/3 Emergent 4 compartment fasciotomy 6/4  Antimicrobials: Anti-infectives (From admission, onward)   Start     Dose/Rate Route Frequency Ordered Stop   01/28/20 1530  cefTRIAXone (ROCEPHIN) 1 g in sodium chloride 0.9 % 100 mL IVPB    Note to Pharmacy: Allergy list reviewed Per checklist, should be OK for cephalosporin   1 g 200 mL/hr over 30 Minutes Intravenous Every 24 hours  01/28/20 1358     01/26/20 0918  ceFAZolin (ANCEF) 2-4 GM/100ML-% IVPB    Note to Pharmacy: [employer], Jessica   : cabinet override      01/26/20 0918 01/26/20 2129      Subjective: Complains of LE discomfort. Family at bedside acknowledges improvement in mentation  Objective: Vitals:   01/29/20 2336 01/30/20 0354 01/30/20 0800 01/30/20 1221  BP: (!) 144/54 132/66 140/75 (!) 138/45  Pulse: 61 64 64 68  Resp: 15 15 14 18   Temp: 98.6 F (37 C) 99.4 F (37.4 C) 99.5 F (37.5 C) 99.5 F (37.5 C)  TempSrc: Oral Oral Oral Oral  SpO2: 100%  100% 98%  Weight:      Height:       No intake or output data in the 24 hours ending 01/30/20 1539 Filed Weights   01/24/20 2115 01/29/20 1046  Weight: 48.5 kg 48.1 kg    Examination: General exam: Conversant, in no acute distress Respiratory system: normal chest rise, clear, no audible wheezing Cardiovascular system: regular rhythm, s1-s2 Gastrointestinal system: Nondistended, nontender, pos BS Central nervous system: No seizures, no tremors Extremities: No cyanosis, no joint deformities Skin: No rashes, no pallor Psychiatry: Affect normal // no auditory hallucinations   Data Reviewed: I have personally reviewed  following labs and imaging studies  CBC: Recent Labs  Lab 01/24/20 2200 01/24/20 2200 01/26/20 0315 01/26/20 0315 01/27/20 0304 01/28/20 0603 01/28/20 1610 01/29/20 0316 01/30/20 0349  WBC 6.1   < > 10.1  --  11.1* 9.1  --  7.9 8.4  NEUTROABS 2.5  --   --   --   --   --   --   --   --   HGB 10.0*   < > 8.1*   < > 7.0* 6.8* 8.1* 8.0* 7.3*  HCT 31.5*   < > 25.5*   < > 21.7* 21.6* 25.4* 24.7* 22.5*  MCV 88.5   < > 87.6  --  88.9 88.9  --  88.5 89.6  PLT 243   < > 232  --  192 223  --  248 273   < > = values in this interval not displayed.   Basic Metabolic Panel: Recent Labs  Lab 01/26/20 0315 01/27/20 0304 01/28/20 0603 01/29/20 0316 01/30/20 0349  NA 137 136 137 138 137  K 3.4* 3.3* 3.5 3.7 3.3*  CL 100 100  102 101 102  CO2 28 25 27 28 25   GLUCOSE 133* 109* 105* 101* 99  BUN 14 15 16 17 18   CREATININE 1.15* 1.08* 1.07* 1.03* 0.96  CALCIUM 8.5* 8.5* 8.7* 8.7* 8.7*  MG  --   --  1.3* 2.3  --    GFR: Estimated Creatinine Clearance: 36.7 mL/min (by C-G formula based on SCr of 0.96 mg/dL). Liver Function Tests: Recent Labs  Lab 01/27/20 0304 01/28/20 0603 01/29/20 0316 01/30/20 0349  AST 17 29 26 19   ALT 6 11 11 11   ALKPHOS 49 49 50 45  BILITOT 0.6 0.5 0.4 0.6  PROT 5.6* 6.1* 5.9* 5.7*  ALBUMIN 2.5* 2.5* 2.5* 2.4*   No results for input(s): LIPASE, AMYLASE in the last 168 hours. No results for input(s): AMMONIA in the last 168 hours. Coagulation Profile: Recent Labs  Lab 01/29/20 0835  INR 1.3*   Cardiac Enzymes: No results for input(s): CKTOTAL, CKMB, CKMBINDEX, TROPONINI in the last 168 hours. BNP (last 3 results) No results for input(s): PROBNP in the last 8760 hours. HbA1C: No results for input(s): HGBA1C in the last 72 hours. CBG: No results for input(s): GLUCAP in the last 168 hours. Lipid Profile: No results for input(s): CHOL, HDL, LDLCALC, TRIG, CHOLHDL, LDLDIRECT in the last 72 hours. Thyroid Function Tests: No results for input(s): TSH, T4TOTAL, FREET4, T3FREE, THYROIDAB in the last 72 hours. Anemia Panel: No results for input(s): VITAMINB12, FOLATE, FERRITIN, TIBC, IRON, RETICCTPCT in the last 72 hours. Sepsis Labs: No results for input(s): PROCALCITON, LATICACIDVEN in the last 168 hours.  Recent Results (from the past 240 hour(s))  SARS Coronavirus 2 by RT PCR (hospital order, performed in [facility] hospital lab) Nasopharyngeal Nasopharyngeal Swab     Status: None   Collection Time: 01/25/20  4:23 AM   Specimen: Nasopharyngeal Swab  Result Value Ref Range Status   SARS Coronavirus 2 NEGATIVE NEGATIVE Final    Comment: (NOTE) SARS-CoV-2 target nucleic acids are NOT DETECTED. The SARS-CoV-2 RNA is generally detectable in upper and lower respiratory  specimens during the acute phase of infection. The lowest concentration of SARS-CoV-2 viral copies this assay can detect is 250 copies / mL. A negative result does not preclude SARS-CoV-2 infection and should not be used as the sole basis for treatment or other patient management decisions.  A negative result may occur with  improper specimen collection / handling, submission of specimen other than nasopharyngeal swab, presence of viral mutation(s) within the areas targeted by this assay, and inadequate number of viral copies (<250 copies / mL). A negative result must be combined with clinical observations, patient history, and epidemiological information. Fact Sheet for Patients:   BoilerBrush.com.cy Fact Sheet for Healthcare Providers: https://pope.com/ This test is not yet approved or cleared  by the [place] FDA and has been authorized for detection and/or diagnosis of SARS-CoV-2 by FDA under an Emergency Use Authorization (EUA).  This EUA will remain in effect (meaning this test can be used) for the duration of the COVID-19 declaration under Section 564(b)(1) of the Act, 21 U.S.C. section 360bbb-3(b)(1), unless the authorization is terminated or revoked sooner. Performed at [facility], [address] [place], [place] [address]   Culture, Urine     Status: Abnormal   Collection Time: 01/28/20  1:57 PM   Specimen: Urine, Random  Result Value Ref Range Status   Specimen Description URINE, RANDOM  Final   Special Requests   Final    NONE Performed at [facility], [address], [place], [place] [address]    Culture 20,000 COLONIES/mL ENTEROCOCCUS FAECALIS (A)  Final   Report Status 01/30/2020 FINAL  Final   Organism ID, Bacteria ENTEROCOCCUS FAECALIS (A)  Final      Susceptibility   Enterococcus faecalis - MIC*    AMPICILLIN <=2 SENSITIVE Sensitive     NITROFURANTOIN <=16 SENSITIVE Sensitive     VANCOMYCIN 1  SENSITIVE Sensitive     * 20,000 COLONIES/mL ENTEROCOCCUS FAECALIS     Radiology Studies: ECHO TEE  Result Date: 01/29/2020    TRANSESOPHOGEAL ECHO REPORT   Patient Name:   WANITA DERENZO Date of Exam: 01/29/2020 Medical Rec #:  [id number]      Height:       63.0 in Accession #:    [id number]     Weight:       107.0 lb Date of Birth:  [birth year]       BSA:          1.482 m Patient Age:    79 years       BP:           175/81 mmHg Patient Gender: F              HR:           65 bpm. Exam Location:  Inpatient Procedure: Transesophageal Echo Indications:    thrombus  History:        Patient has prior history of Echocardiogram examinations, most                 recent 01/25/2020. COPD; Risk Factors:Hypertension and Current                 Smoker. MVP. PSVT.  Sonographer:    Celene Skeen RDCS (AE) Referring Phys: [id number] LAURA R INGOLD PROCEDURE: After discussion of the risks and benefits of a TEE, an informed consent was obtained from the patient. TEE procedure time was 24 minutes. The transesophogeal probe was passed without difficulty through the esophogus of the patient. Imaged were obtained with the patient in a left lateral decubitus position. Sedation performed by different physician. The patient developed no complications during the procedure. IMPRESSIONS  1. Left ventricular ejection fraction, by estimation, is 65 to 70%. The left ventricle has normal function. The left ventricle has no regional wall  motion abnormalities.  2. Right ventricular systolic function is normal. The right ventricular size is normal.  3. Left atrial size was mildly dilated. No left atrial/left atrial appendage thrombus was detected. The LAA emptying velocity was 42 cm/s.  4. The mitral valve is grossly normal. Mild mitral valve regurgitation.  5. The aortic valve is normal in structure. Aortic valve regurgitation is mild to moderate.  6. There is mild (Grade II) atheroma plaque involving the transverse and descending aorta.  7. Agitated  saline contrast bubble study was negative, with no evidence of any interatrial shunt. Conclusion(s)/Recommendation(s): No LA/LAA thrombus identified. Negative bubble study for interatrial shunt. No intracardiac source of embolism detected on this on this transesophageal echocardiogram. FINDINGS  Left Ventricle: Left ventricular ejection fraction, by estimation, is 65 to 70%. The left ventricle has normal function. The left ventricle has no regional wall motion abnormalities. The left ventricular internal cavity size was normal in size. Right Ventricle: The right ventricular size is normal. No increase in right ventricular wall thickness. Right ventricular systolic function is normal. Left Atrium: Left atrial size was mildly dilated. No left atrial/left atrial appendage thrombus was detected. The LAA emptying velocity was 42 cm/s. Right Atrium: Right atrial size was normal in size. Pericardium: A small pericardial effusion is present. Mitral Valve: The mitral valve is grossly normal. Mild mitral valve regurgitation. Tricuspid Valve: The tricuspid valve is normal in structure. Tricuspid valve regurgitation is mild. Aortic Valve: The aortic valve is normal in structure. Aortic valve regurgitation is mild to moderate. Pulmonic Valve: The pulmonic valve was normal in structure. Pulmonic valve regurgitation is trivial. Aorta: The aortic root and ascending aorta are structurally normal, with no evidence of dilitation. There is mild (Grade II) atheroma plaque involving the transverse and descending aorta. IAS/Shunts: No atrial level shunt detected by color flow Doppler. Agitated saline contrast was given intravenously to evaluate for intracardiac shunting. Agitated saline contrast bubble study was negative, with no evidence of any interatrial shunt.   AORTA Ao Root diam: 3.10 cm Ao Asc diam:  3.00 cm Weston Brass MD Electronically signed by Weston Brass MD Signature Date/Time: 01/29/2020/9:21:09 PM    Final      Scheduled Meds:  sodium chloride   Intravenous Once   Chlorhexidine Gluconate Cloth  6 each Topical Daily   clopidogrel  75 mg Oral Daily   flecainide  50 mg Oral BID   FLUoxetine  20 mg Oral BID   metoprolol succinate  25 mg Oral QHS   pantoprazole  40 mg Oral Daily   sodium chloride flush  3 mL Intravenous Q12H   Continuous Infusions:  cefTRIAXone (ROCEPHIN)  IV 1 g (01/29/20 1757)   heparin 900 Units/hr (01/30/20 1218)     LOS: 5 days   Rickey Barbara, MD [facility] Pager On Amion  If 7PM-7AM, please contact night-coverage 01/30/2020, 3:39 PM

## 2020-01-30 NOTE — Progress Notes (Signed)
ANTICOAGULATION CONSULT NOTE - Follow Up Consult  Pharmacy Consult for Heparin Indication: s/p BL popliteal embolectomy  Allergies  Allergen Reactions  . Dobutamine Other (See Comments)    Heart beating hard with CP, neck pain, and weakness  . Epinephrine Other (See Comments)    Fast heart beat  . Darvocet [Propoxyphene N-Acetaminophen] Nausea And Vomiting  . Excedrin Extra Strength [Aspirin-Acetaminophen-Caffeine] Nausea And Vomiting  . Aspirin   . Clarithromycin Other (See Comments)    Yeast infection  . Doxycycline Other (See Comments)    Makes stomach hurt  . Penicillins Other (See Comments)    Yeast infection  . Prednisone Other (See Comments)    Shakes   . Propoxyphene Nausea And Vomiting    Patient Measurements: Height: 5\' 3"  (160 cm) Weight: 48.1 kg (106 lb) IBW/kg (Calculated) : 52.4 Heparin Dosing Weight:  48.5 kg  Vital Signs: Temp: 99.5 F (37.5 C) (06/08 1221) Temp Source: Oral (06/08 1221) BP: 138/45 (06/08 1221) Pulse Rate: 68 (06/08 1221)  Labs: Recent Labs    01/28/20 0603 01/28/20 0603 01/28/20 1610 01/28/20 1610 01/29/20 0316 01/29/20 0835 01/30/20 0349 01/30/20 1445  HGB 6.8*   < > 8.1*   < > 8.0*  --  7.3*  --   HCT 21.6*   < > 25.4*  --  24.7*  --  22.5*  --   PLT 223  --   --   --  248  --  273  --   LABPROT  --   --   --   --   --  15.5*  --   --   INR  --   --   --   --   --  1.3*  --   --   HEPARINUNFRC 0.26*   < > 0.25*   < > 0.49  --  0.72* 0.31  CREATININE 1.07*  --   --   --  1.03*  --  0.96  --    < > = values in this interval not displayed.    Estimated Creatinine Clearance: 36.7 mL/min (by C-G formula based on SCr of 0.96 mg/dL).  Assessment: 79 yr old female on heparin for bilateral cardiogenic emboli, S/P bilateral embolectomy 6/4. No AC PTA.   Heparin level ~9.5 hrs after heparin infusion was decreased to 900 units/hr was 0.31 units/ml; H/H 7.3/22.5, platelets 273. Per RN, no issues with IV or bleeding  observed.  Goal of Therapy:  Heparin level 0.3-0.7 units/ml Monitor platelets by anticoagulation protocol: Yes   Plan:  Increase heparin infusion to 950 units/hr Check 8-hr heparin level Monitor daily heparin level, CBC Monitor for signs/symptoms of bleeding F/U plans for oral anticoagulation prior to discharge  10-11-1969, PharmD, BCPS, [facility] Clinical Pharmacist  01/30/2020 3:46 PM

## 2020-01-30 NOTE — Progress Notes (Signed)
Inpatient Rehabilitation Admissions Coordinator  I met with patient and her daughter at bedside. I await  further progress with therapy before proceeding with insurance authorization for a possible CIR admit.   Danne Baxter, RN, MSN Rehab Admissions Coordinator [phone] 01/30/2020 12:07 PM

## 2020-01-30 NOTE — Progress Notes (Addendum)
Progress Note    01/30/2020 7:25 AM 1 Day Post-Op  Subjective:  Pain LLE   Vitals:   01/29/20 2336 01/30/20 0354  BP: (!) 144/54 132/66  Pulse: 61 64  Resp: 15 15  Temp: 98.6 F (37 C) 99.4 F (37.4 C)  SpO2: 100%    Physical Exam: Lungs:  Non labored Incisions:  R leg incisions healing well; L leg incisions under tension but well approximated Extremities:  Palpable R DP pulse; L PT and AT by doppler Neurologic: somewhat confused this morning  CBC    Component Value Date/Time   WBC 8.4 01/30/2020 0349   RBC 2.51 (L) 01/30/2020 0349   HGB 7.3 (L) 01/30/2020 0349   HGB 10.6 (L) 04/10/2019 1047   HCT 22.5 (L) 01/30/2020 0349   HCT 31.9 (L) 04/10/2019 1047   PLT 273 01/30/2020 0349   PLT 308 04/10/2019 1047   MCV 89.6 01/30/2020 0349   MCV 91 04/10/2019 1047   MCV 92 10/23/2013 0803   MCH 29.1 01/30/2020 0349   MCHC 32.4 01/30/2020 0349   RDW 13.7 01/30/2020 0349   RDW 11.3 (L) 04/10/2019 1047   RDW 14.0 10/23/2013 0803   LYMPHSABS 2.6 01/24/2020 2200   LYMPHSABS 2.4 10/23/2013 0803   MONOABS 0.7 01/24/2020 2200   MONOABS 0.7 10/23/2013 0803   EOSABS 0.3 01/24/2020 2200   EOSABS 0.3 10/23/2013 0803   BASOSABS 0.0 01/24/2020 2200   BASOSABS 0.1 10/23/2013 0803    BMET    Component Value Date/Time   NA 137 01/30/2020 0349   NA 139 04/10/2019 1047   NA 140 10/23/2013 0803   K 3.3 (L) 01/30/2020 0349   K 4.6 10/23/2013 0803   CL 102 01/30/2020 0349   CL 107 10/23/2013 0803   CO2 25 01/30/2020 0349   CO2 28 10/23/2013 0803   GLUCOSE 99 01/30/2020 0349   GLUCOSE 87 10/23/2013 0803   BUN 18 01/30/2020 0349   BUN 19 04/10/2019 1047   BUN 6 (L) 10/23/2013 0803   CREATININE 0.96 01/30/2020 0349   CREATININE 0.66 07/24/2016 1125   CALCIUM 8.7 (L) 01/30/2020 0349   CALCIUM 8.8 10/23/2013 0803   GFRNONAA 57 (L) 01/30/2020 0349   GFRNONAA >60 10/23/2013 0803   GFRAA >60 01/30/2020 0349   GFRAA >60 10/23/2013 0803    INR    Component Value Date/Time     INR 1.3 (H) 01/29/2020 0835   INR 1.1 10/22/2013 0125     Intake/Output Summary (Last 24 hours) at 01/30/2020 0725 Last data filed at 01/29/2020 1300 Gross per 24 hour  Intake 540 ml  Output 450 ml  Net 90 ml     Assessment/Plan:  79 y.o. female is s/p BLE thrombectomies and subsequent fasciotomies; also 1 Day Post-Op after TEE   TEE negative Heparin continued; Hgb 7.3 down from 8 L foot well perfused with AT and PT by doppler however LLE is edematous and painful; no firm palpable hematoma, lateral fasciotomy incision under tension; may need staples removed if edema or pain worsens Will discuss with Dr. Cherly Anderson, PA-C [facility] [phone] 01/30/2020 7:25 AM  I have examined the patient, reviewed and agree with above.  Oriented this morning.  Still with discomfort.  Still with pain in her left calf with dorsiflexion which is unchanged from yesterday.  She does have some fullness over her lateral incision.  Suspect that she does have some hematoma there but her fascia is opened.  We  will continue to watch.  Gretta Began, MD 01/30/2020 9:02 AM

## 2020-01-31 LAB — HEMOGLOBIN AND HEMATOCRIT, BLOOD
HCT: 24.9 % — ABNORMAL LOW (ref 36.0–46.0)
Hemoglobin: 8 g/dL — ABNORMAL LOW (ref 12.0–15.0)

## 2020-01-31 LAB — COMPREHENSIVE METABOLIC PANEL
ALT: 12 U/L (ref 0–44)
AST: 17 U/L (ref 15–41)
Albumin: 2.4 g/dL — ABNORMAL LOW (ref 3.5–5.0)
Alkaline Phosphatase: 46 U/L (ref 38–126)
Anion gap: 12 (ref 5–15)
BUN: 18 mg/dL (ref 8–23)
CO2: 25 mmol/L (ref 22–32)
Calcium: 8.8 mg/dL — ABNORMAL LOW (ref 8.9–10.3)
Chloride: 102 mmol/L (ref 98–111)
Creatinine, Ser: 1.06 mg/dL — ABNORMAL HIGH (ref 0.44–1.00)
GFR calc Af Amer: 58 mL/min — ABNORMAL LOW (ref >60)
GFR calc non Af Amer: 50 mL/min — ABNORMAL LOW (ref >60)
Glucose, Bld: 128 mg/dL — ABNORMAL HIGH (ref 70–99)
Potassium: 4.6 mmol/L (ref 3.5–5.1)
Sodium: 139 mmol/L (ref 135–145)
Total Bilirubin: 0.7 mg/dL (ref 0.3–1.2)
Total Protein: 5.6 g/dL — ABNORMAL LOW (ref 6.5–8.1)

## 2020-01-31 LAB — CBC
HCT: 20.3 % — ABNORMAL LOW (ref 36.0–46.0)
Hemoglobin: 6.4 g/dL — CL (ref 12.0–15.0)
MCH: 28.4 pg (ref 26.0–34.0)
MCHC: 31.5 g/dL (ref 30.0–36.0)
MCV: 90.2 fL (ref 80.0–100.0)
Platelets: 252 10*3/uL (ref 150–400)
RBC: 2.25 MIL/uL — ABNORMAL LOW (ref 3.87–5.11)
RDW: 13.9 % (ref 11.5–15.5)
WBC: 13.1 10*3/uL — ABNORMAL HIGH (ref 4.0–10.5)
nRBC: 0.2 % (ref 0.0–0.2)

## 2020-01-31 LAB — HEPARIN LEVEL (UNFRACTIONATED): Heparin Unfractionated: 0.44 IU/mL (ref 0.30–0.70)

## 2020-01-31 LAB — MAGNESIUM: Magnesium: 1.4 mg/dL — ABNORMAL LOW (ref 1.7–2.4)

## 2020-01-31 LAB — PREPARE RBC (CROSSMATCH)

## 2020-01-31 MED ORDER — SODIUM CHLORIDE 0.9% IV SOLUTION: Freq: Once | INTRAVENOUS | Status: AC

## 2020-01-31 MED ORDER — MAGNESIUM SULFATE 2 GM/50ML IV SOLN
2.0000 g | Freq: Once | INTRAVENOUS | Status: AC
  Administered 2020-01-31: 2 g via INTRAVENOUS
  Filled 2020-01-31: qty 50

## 2020-01-31 NOTE — Progress Notes (Signed)
CRITICAL VALUE ALERT  Critical Value:  HGb 6.4  Date & Time Notied:  01/31/20 01:15  Provider Notified: M. Katherina Right NP paged at 01:16  Orders Received/Actions taken: Orders received for 1 unit of blood to be transfused.

## 2020-01-31 NOTE — Progress Notes (Addendum)
PROGRESS NOTE    Barbara Hill  EHM:[id number] DOB: 03-29-1941 DOA: 01/24/2020 PCP: Richmond Campbell., PA-C    Brief Narrative:  79 y.o. 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 Her right leg pain started acutely and was severe. She had also had long-term Holter monitoring performed in March of this year which showed no significant arrhythmias.  She does admit to still smoking cigarettes, but reports that she will quit as she will related with the pain that she had in her right leg that brought her to the hospital. Upon admission into the emergency department patient was noted to be afebrile with a pressure elevated up to 217/84, heart rates intermittently fluctuating from 60-130, and all other vital signs maintained.  Labs were significant for hemoglobin 10, BUN 18, and creatinine 1.21.  CT scan of the abdomen pelvis revealed bilateral popliteal embolus.  Patient was taken immediately to the operating grading room by vascular surgery for bilateral popliteal embolectomy.  Assessment & Plan:   Principal Problem:   Ischemic leg Active Problems:   Paroxysmal SVT (supraventricular tachycardia) (HCC)   Tobacco use disorder   COPD (chronic obstructive pulmonary disease) (HCC)   Normocytic anemia   History of CVA (cerebrovascular accident)   Anxiety   CKD (chronic kidney disease), stage III  Acute bilateral ischemic legs s/p bilateral popliteal embolectomy on 01/25/20 with subsequent 4 compartment fasciotomies on 01/26/20 CT abd/pelvis showed bilateral popliteal embolus Vascular Surgery consulted and patient underwent B popliteal exploration and embolectomy on 6/3 with subsequent 4 compartment fasciotomies, performed on 6/4 Underwent TEE 6/7 with no embolic source identified Pt is continued on heparin gtt  per Vascular Surgery  Paroxysmal SVT Stable Cont with home flecainide 50mg  twice daily  Hypomagnesemia Replace prn  COPD/tobacco use Patient without signs of acute exacerbation of her COPD.   Reportedly declined nicotine patch on presentation Cont with Albuterol nebs as needed shortness of breath/wheezing Advised to quit  Memory deficit with acute toxicmetabolic encephalopathy secondary to enterococcus UTI Patient noted issues with memory and possibly confused at times Urine cx pos for enterococcus species Was on empiric rocephin. Will transition to amoxicillin. Will also give dose of diflucan given hx of yeast infections with abx  Acute blood loss anemia likely secondary to post op/recent surgery/hematoma Vs unlikely hematuria Hemoglobin dropped to 6.4 on 01/31/20, s/p 2U of PRBC, last transfused on 01/31/20 Large blood on UA. Have discussed with Urology on call. Pt is currently treated for enterococcus UTI which is most likely the culprit for hematuria, per Urology. Urology recommendation to cont to treat for enterococcus UTI and monitor for clot formation resulting in bladder outlet obstruction. Ultimately when discharged, recommendation to follow up with pt's primary Urologist, Dr. 04/01/20 for hematuria work up.  History of CVA  Patient with previous history of stroke following procedure in 2017  Essential hypertension BP stable  Home medications include losartan 25 mg daily and metoprolol 25 mg nightly Continued on metoprolol  Chronic kidney disease stage IIIb Patient presents with creatinine of 1.21 which appears within her baseline of 1.2-1.3. Daily BMP  Anxiety Continue Xanax 0.5 mg 3 times daily as needed and fluoxetine 20 mg twice daily  GERD Home meds include omeprazole 20 mg daily    DVT prophylaxis: Heparin gtt Code Status: Full Family Communication: Discussed extensively with patient  Status is: Inpatient  Remains inpatient appropriate  because:Hemodynamically unstable, Ongoing diagnostic testing needed not appropriate for outpatient work up, IV treatments appropriate due to intensity of illness or inability to take PO and Inpatient level of care appropriate due to severity of illness   Dispo: The patient is from: Home              Anticipated d/c is to: CIR              Anticipated d/c date is: > 3 days, when cleared by Vascular, cardiology              Patient currently is not medically stable to d/c.   Consultants:   Vascular Surgery  Cardiology  Discussed with urology  Procedures:  B popliteal exploration and embolectomy on 6/3 Emergent 4 compartment fasciotomy 6/4  Antimicrobials: Anti-infectives (From admission, onward)   Start     Dose/Rate Route Frequency Ordered Stop   01/30/20 1630  amoxicillin (AMOXIL) capsule 500 mg     500 mg Oral Every 8 hours 01/30/20 1619     01/30/20 1630  fluconazole (DIFLUCAN) tablet 150 mg     150 mg Oral  Once 01/30/20 1619 01/30/20 1733   01/28/20 1530  cefTRIAXone (ROCEPHIN) 1 g in sodium chloride 0.9 % 100 mL IVPB  Status:  Discontinued    Note to Pharmacy: Allergy list reviewed Per checklist, should be OK for cephalosporin   1 g 200 mL/hr over 30 Minutes Intravenous Every 24 hours 01/28/20 1358 01/30/20 1619   01/26/20 0918  ceFAZolin (ANCEF) 2-4 GM/100ML-% IVPB    Note to Pharmacy: Jasmine Pang   : cabinet override      01/26/20 0918 01/26/20 2129      Subjective: Mentation noted to be improved, still with bilateral lower extremity discomfort, denies any chest pain, abdominal pain, shortness of breath, fever/chills  Objective: Vitals:   01/31/20 0453 01/31/20 0929 01/31/20 0941 01/31/20 1300  BP: (!) 127/58 (!) 155/63  (!) 150/56  Pulse: 67   66  Resp: 18     Temp: 98.4 F (36.9 C) 98.8 F (37.1 C)  98 F (36.7 C)  TempSrc: Oral Oral  Oral  SpO2: 100%  100% 100%  Weight:      Height:        Intake/Output Summary (Last 24 hours) at 01/31/2020  1625 Last data filed at 01/31/2020 0500 Gross per 24 hour  Intake 689.56 ml  Output 425 ml  Net 264.56 ml   Filed Weights   01/24/20 2115 01/29/20 1046  Weight: 48.5 kg 48.1 kg    Examination:  General: NAD, frail, oriented, improved mentation    Cardiovascular: S1, S2 present  Respiratory: CTAB  Abdomen: Soft, nontender, nondistended, bowel sounds present  Musculoskeletal: Noted 4 incision in b/l thigh with staples, c/d/i, LLE edema noted   Skin: As mentioned above  Psychiatry: Normal mood    Data Reviewed: I have personally reviewed following labs and imaging studies  CBC: Recent Labs  Lab 01/24/20 2200 01/26/20 0315 01/27/20 0304 01/27/20 0304 01/28/20 0603 01/28/20 0603 01/28/20 1610 01/29/20 0316 01/30/20 0349 01/31/20 0012 01/31/20 0731  WBC 6.1   < > 11.1*  --  9.1  --   --  7.9 8.4 13.1*  --   NEUTROABS 2.5  --   --   --   --   --   --   --   --   --   --   HGB 10.0*   < >  7.0*   < > 6.8*   < > 8.1* 8.0* 7.3* 6.4* 8.0*  HCT 31.5*   < > 21.7*   < > 21.6*   < > 25.4* 24.7* 22.5* 20.3* 24.9*  MCV 88.5   < > 88.9  --  88.9  --   --  88.5 89.6 90.2  --   PLT 243   < > 192  --  223  --   --  248 273 252  --    < > = values in this interval not displayed.   Basic Metabolic Panel: Recent Labs  Lab 01/27/20 0304 01/28/20 0603 01/29/20 0316 01/30/20 0349 01/31/20 0012  NA 136 137 138 137 139  K 3.3* 3.5 3.7 3.3* 4.6  CL 100 102 101 102 102  CO2 25 27 28 25 25   GLUCOSE 109* 105* 101* 99 128*  BUN 15 16 17 18 18   CREATININE 1.08* 1.07* 1.03* 0.96 1.06*  CALCIUM 8.5* 8.7* 8.7* 8.7* 8.8*  MG  --  1.3* 2.3  --  1.4*   GFR: Estimated Creatinine Clearance: 33.2 mL/min (A) (by C-G formula based on SCr of 1.06 mg/dL (H)). Liver Function Tests: Recent Labs  Lab 01/27/20 0304 01/28/20 0603 01/29/20 0316 01/30/20 0349 01/31/20 0012  AST 17 29 26 19 17   ALT 6 11 11 11 12   ALKPHOS 49 49 50 45 46  BILITOT 0.6 0.5 0.4 0.6 0.7  PROT 5.6* 6.1* 5.9* 5.7*  5.6*  ALBUMIN 2.5* 2.5* 2.5* 2.4* 2.4*   No results for input(s): LIPASE, AMYLASE in the last 168 hours. No results for input(s): AMMONIA in the last 168 hours. Coagulation Profile: Recent Labs  Lab 01/29/20 0835  INR 1.3*   Cardiac Enzymes: No results for input(s): CKTOTAL, CKMB, CKMBINDEX, TROPONINI in the last 168 hours. BNP (last 3 results) No results for input(s): PROBNP in the last 8760 hours. HbA1C: No results for input(s): HGBA1C in the last 72 hours. CBG: No results for input(s): GLUCAP in the last 168 hours. Lipid Profile: No results for input(s): CHOL, HDL, LDLCALC, TRIG, CHOLHDL, LDLDIRECT in the last 72 hours. Thyroid Function Tests: No results for input(s): TSH, T4TOTAL, FREET4, T3FREE, THYROIDAB in the last 72 hours. Anemia Panel: No results for input(s): VITAMINB12, FOLATE, FERRITIN, TIBC, IRON, RETICCTPCT in the last 72 hours. Sepsis Labs: No results for input(s): PROCALCITON, LATICACIDVEN in the last 168 hours.  Recent Results (from the past 240 hour(s))  SARS Coronavirus 2 by RT PCR (hospital order, performed in [facility] hospital lab) Nasopharyngeal Nasopharyngeal Swab     Status: None   Collection Time: 01/25/20  4:23 AM   Specimen: Nasopharyngeal Swab  Result Value Ref Range Status   SARS Coronavirus 2 NEGATIVE NEGATIVE Final    Comment: (NOTE) SARS-CoV-2 target nucleic acids are NOT DETECTED. The SARS-CoV-2 RNA is generally detectable in upper and lower respiratory specimens during the acute phase of infection. The lowest concentration of SARS-CoV-2 viral copies this assay can detect is 250 copies / mL. A negative result does not preclude SARS-CoV-2 infection and should not be used as the sole basis for treatment or other patient management decisions.  A negative result may occur with improper specimen collection / handling, submission of specimen other than nasopharyngeal swab, presence of viral mutation(s) within the areas targeted by this assay,  and inadequate number of viral copies (<250 copies / mL). A negative result must be combined with clinical observations, patient history, and epidemiological information. Fact Sheet for Patients:  BoilerBrush.com.cy Fact Sheet for Healthcare Providers: https://pope.com/ This test is not yet approved or cleared  by the [place] FDA and has been authorized for detection and/or diagnosis of SARS-CoV-2 by FDA under an Emergency Use Authorization (EUA).  This EUA will remain in effect (meaning this test can be used) for the duration of the COVID-19 declaration under Section 564(b)(1) of the Act, 21 U.S.C. section 360bbb-3(b)(1), unless the authorization is terminated or revoked sooner. Performed at [facility], [address], [place], [place] [address]   Culture, Urine     Status: Abnormal   Collection Time: 01/28/20  1:57 PM   Specimen: Urine, Random  Result Value Ref Range Status   Specimen Description URINE, RANDOM  Final   Special Requests   Final    NONE Performed at [facility], [address] [place], [place] [address]    Culture 20,000 COLONIES/mL ENTEROCOCCUS FAECALIS (A)  Final   Report Status 01/30/2020 FINAL  Final   Organism ID, Bacteria ENTEROCOCCUS FAECALIS (A)  Final      Susceptibility   Enterococcus faecalis - MIC*    AMPICILLIN <=2 SENSITIVE Sensitive     NITROFURANTOIN <=16 SENSITIVE Sensitive     VANCOMYCIN 1 SENSITIVE Sensitive     * 20,000 COLONIES/mL ENTEROCOCCUS FAECALIS     Radiology Studies: No results found.  Scheduled Meds: . sodium chloride   Intravenous Once  . amoxicillin  500 mg Oral Q8H  . Chlorhexidine Gluconate Cloth  6 each Topical Daily  . clopidogrel  75 mg Oral Daily  . flecainide  50 mg Oral BID  . FLUoxetine  20 mg Oral BID  . metoprolol succinate  25 mg Oral QHS  . pantoprazole  40 mg Oral Daily  . sodium chloride flush  3 mL Intravenous Q12H   Continuous  Infusions: . heparin 950 Units/hr (01/31/20 1348)     LOS: 6 days   Briant Cedar, MD [facility] Pager On Amion  If 7PM-7AM, please contact night-coverage 01/31/2020, 4:25 PM

## 2020-01-31 NOTE — Progress Notes (Signed)
Occupational Therapy Treatment Patient Details Name: Barbara Hill MRN: [id number] DOB: 08/07/1941 Today's Date: 01/31/2020    History of present illness Patient is a 79 y/o female who presented with acute ischemic lower extremities now s/p bilateral popliteal exploration and embolectomy 01/25/20. 24 hours later developed compartment syndrome and went for bilateral 4 compartment fasciotomy 01/26/20.  Has some post op confusion.  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   OT comments  Pt very pleasant and willing to work with therapies despite having continued pain in LEs. Pt requiring two person assist for safe completion of functional transfers (both with RW and via HHA), assisting back to bed after being up in recliner end of session. Pt continues to require maxA for LB ADL and setup/supervision for seated grooming/self-feeding ADL. VSS throughout. Feel she remains appropriate candidate for CIR level therapies at time of discharge. Will continue to follow acutely.   Follow Up Recommendations  CIR    Equipment Recommendations  Other (comment)(TBD)          Precautions / Restrictions Precautions Precautions: Fall Restrictions Weight Bearing Restrictions: No       Mobility Bed Mobility Overal bed mobility: Needs Assistance Bed Mobility: Sit to Supine       Sit to supine: Max assist   General bed mobility comments: for trunk and LE support  Transfers Overall transfer level: Needs assistance Equipment used: Rolling walker (2 wheeled);2 person hand held assist Transfers: Sit to/from [employer] Sit to Stand: Max assist;+2 physical assistance Stand pivot transfers: Max assist;+2 physical assistance       General transfer comment: pt stood to [employer], use of +2 face to face for stand pivot back to bed from recliner    Balance Overall balance assessment: Needs assistance Sitting-balance support:  Feet supported;Feet unsupported;Single extremity supported Sitting balance-Leahy Scale: Fair Sitting balance - Comments: LOB forward when returning to sitting   Standing balance support: Bilateral upper extremity supported Standing balance-Leahy Scale: Poor Standing balance comment: external assist required                            ADL either performed or assessed with clinical judgement   ADL Overall ADL's : Needs assistance/impaired Eating/Feeding: Set up;Sitting Eating/Feeding Details (indicate cue type and reason): setup with dinner tray end of session Grooming: Wash/dry face;Set up;Supervision/safety;Sitting                               Functional mobility during ADLs: Maximal assistance;+2 for physical assistance       Vision       Perception     Praxis      Cognition Arousal/Alertness: Awake/alert Behavior During Therapy: Anxious Overall Cognitive Status: Impaired/Different from baseline Area of Impairment: Attention;Following commands;Problem solving;Memory;Orientation                   Current Attention Level: Sustained Memory: Decreased short-term memory Following Commands: Follows one step commands with increased time     Problem Solving: Slow processing;Difficulty sequencing;Requires verbal cues;Requires tactile cues General Comments: pleasantly confused         Exercises Exercises: General Lower Extremity Other Exercises Other Exercises: gentle AAROM bil LEs   Shoulder Instructions       General Comments VSS, incision sites clean and intact     Pertinent  Vitals/ Pain       Pain Assessment: Faces Faces Pain Scale: Hurts whole lot Pain Location: bilateral LE's L>R Pain Descriptors / Indicators: Aching;Grimacing;Discomfort Pain Intervention(s): Monitored during session;Repositioned;RN gave pain meds during session  Home Living                                          Prior  Functioning/Environment              Frequency  Min 2X/week        Progress Toward Goals  OT Goals(current goals can now be found in the care plan section)  Progress towards OT goals: Progressing toward goals  Acute Rehab OT Goals Patient Stated Goal: to go home OT Goal Formulation: With patient/family Time For Goal Achievement: 02/11/20 Potential to Achieve Goals: Good ADL Goals Pt Will Perform Grooming: with set-up;sitting Pt Will Perform Lower Body Dressing: with min assist;sitting/lateral leans;sit to/from stand Pt Will Transfer to Toilet: with min assist;stand pivot transfer;bedside commode Pt Will Perform Toileting - Clothing Manipulation and hygiene: sitting/lateral leans;sit to/from stand;with min guard assist Additional ADL Goal #1: Pt will consistently follow 1-2 step commands with ADL/mobility tasks in order to increase awareness and increase independence.  Plan Discharge plan remains appropriate    Co-evaluation    PT/OT/SLP Co-Evaluation/Treatment: Yes Reason for Co-Treatment: For patient/therapist safety;To address functional/ADL transfers   OT goals addressed during session: ADL's and self-care      AM-PAC OT "6 Clicks" Daily Activity     Outcome Measure   Help from another person eating meals?: A Little Help from another person taking care of personal grooming?: A Little Help from another person toileting, which includes using toliet, bedpan, or urinal?: A Lot Help from another person bathing (including washing, rinsing, drying)?: A Lot Help from another person to put on and taking off regular upper body clothing?: A Little Help from another person to put on and taking off regular lower body clothing?: A Lot 6 Click Score: 15    End of Session Equipment Utilized During Treatment: Gait belt;Rolling walker  OT Visit Diagnosis: Unsteadiness on feet (R26.81);Muscle weakness (generalized) (M62.81);Pain;Other symptoms and signs involving cognitive  function Pain - Right/Left: Right Pain - part of body: Leg(R>L)   Activity Tolerance Patient tolerated treatment well;Patient limited by pain   Patient Left in bed;with call bell/phone within reach;with bed alarm set   Nurse Communication Mobility status        Time: 6063-0160 OT Time Calculation (min): 32 min  Charges: OT General Charges $OT Visit: 1 Visit OT Treatments $Self Care/Home Management : 8-22 mins  Lou Cal, [place] Pager [phone] Office [place] 01/31/2020, 6:14 PM

## 2020-01-31 NOTE — Progress Notes (Addendum)
  Progress Note    01/31/2020 8:01 AM 2 Days Post-Op  Subjective:   Somnolent this morning   Vitals:   01/31/20 0348 01/31/20 0453  BP: (!) 144/55 (!) 127/58  Pulse: 66 67  Resp: 18 18  Temp: 98.5 F (36.9 C) 98.4 F (36.9 C)  SpO2: 100% 100%   Physical Exam: Lungs:  Non labored on O2 by [place] Incisions:  RLE incisions c/d/i; L LE incisions c/d/i, fullness palpable in area of both medial and lateral incisions Extremities:  LLE edematous; palpable R DP pulse; brisk L PTA signal by doppler; soft L ATA signal by doppler Neurologic: somnolent  CBC    Component Value Date/Time   WBC 13.1 (H) 01/31/2020 0012   RBC 2.25 (L) 01/31/2020 0012   HGB 6.4 (LL) 01/31/2020 0012   HGB 10.6 (L) 04/10/2019 1047   HCT 20.3 (L) 01/31/2020 0012   HCT 31.9 (L) 04/10/2019 1047   PLT 252 01/31/2020 0012   PLT 308 04/10/2019 1047   MCV 90.2 01/31/2020 0012   MCV 91 04/10/2019 1047   MCV 92 10/23/2013 0803   MCH 28.4 01/31/2020 0012   MCHC 31.5 01/31/2020 0012   RDW 13.9 01/31/2020 0012   RDW 11.3 (L) 04/10/2019 1047   RDW 14.0 10/23/2013 0803   LYMPHSABS 2.6 01/24/2020 2200   LYMPHSABS 2.4 10/23/2013 0803   MONOABS 0.7 01/24/2020 2200   MONOABS 0.7 10/23/2013 0803   EOSABS 0.3 01/24/2020 2200   EOSABS 0.3 10/23/2013 0803   BASOSABS 0.0 01/24/2020 2200   BASOSABS 0.1 10/23/2013 0803    BMET    Component Value Date/Time   NA 139 01/31/2020 0012   NA 139 04/10/2019 1047   NA 140 10/23/2013 0803   K 4.6 01/31/2020 0012   K 4.6 10/23/2013 0803   CL 102 01/31/2020 0012   CL 107 10/23/2013 0803   CO2 25 01/31/2020 0012   CO2 28 10/23/2013 0803   GLUCOSE 128 (H) 01/31/2020 0012   GLUCOSE 87 10/23/2013 0803   BUN 18 01/31/2020 0012   BUN 19 04/10/2019 1047   BUN 6 (L) 10/23/2013 0803   CREATININE 1.06 (H) 01/31/2020 0012   CREATININE 0.66 07/24/2016 1125   CALCIUM 8.8 (L) 01/31/2020 0012   CALCIUM 8.8 10/23/2013 0803   GFRNONAA 50 (L) 01/31/2020 0012   GFRNONAA >60 10/23/2013  0803   GFRAA 58 (L) 01/31/2020 0012   GFRAA >60 10/23/2013 0803    INR    Component Value Date/Time   INR 1.3 (H) 01/29/2020 0835   INR 1.1 10/22/2013 0125     Intake/Output Summary (Last 24 hours) at 01/31/2020 0801 Last data filed at 01/31/2020 0500 Gross per 24 hour  Intake 689.56 ml  Output 425 ml  Net 264.56 ml     Assessment/Plan:  79 y.o. female is s/p BLE thrombectomies and subsequent fasciotomies; also 2 Days Post-Op after TEE   -Hgb drifting down to 6.4; primary service has ordered 1u p RBCs to be transfused -Unknown source of bleeding; hematoma of L calf however this feels stable -BLE well perfused based on exam findings -Patient needs to mobilize with nursing staff and therapy teams   Emilie Rutter, PA-C [facility] [phone] 01/31/2020 8:01 AM  I have examined the patient, reviewed and agree with above.  Gretta Began, MD 01/31/2020 2:21 PM

## 2020-01-31 NOTE — Progress Notes (Signed)
ANTICOAGULATION CONSULT NOTE - Follow Up Consult  Pharmacy Consult for Heparin Indication: s/p BL popliteal embolectomy  Allergies  Allergen Reactions  . Dobutamine Other (See Comments)    Heart beating hard with CP, neck pain, and weakness  . Epinephrine Other (See Comments)    Fast heart beat  . Darvocet [Propoxyphene N-Acetaminophen] Nausea And Vomiting  . Excedrin Extra Strength [Aspirin-Acetaminophen-Caffeine] Nausea And Vomiting  . Aspirin   . Clarithromycin Other (See Comments)    Yeast infection  . Doxycycline Other (See Comments)    Makes stomach hurt  . Penicillins Other (See Comments)    Yeast infection  . Prednisone Other (See Comments)    Shakes   . Propoxyphene Nausea And Vomiting    Patient Measurements: Height: 5\' 3"  (160 cm) Weight: 48.1 kg (106 lb) IBW/kg (Calculated) : 52.4 Heparin Dosing Weight:  48.5 kg  Vital Signs: Temp: 98.8 F (37.1 C) (06/08 2342) Temp Source: Oral (06/08 2342) BP: 135/67 (06/08 2342) Pulse Rate: 64 (06/08 2342)  Labs: Recent Labs    01/29/20 0316 01/29/20 0316 01/29/20 0835 01/30/20 0349 01/30/20 1445 01/31/20 0012  HGB 8.0*   < >  --  7.3*  --  6.4*  HCT 24.7*  --   --  22.5*  --  20.3*  PLT 248  --   --  273  --  252  LABPROT  --   --  15.5*  --   --   --   INR  --   --  1.3*  --   --   --   HEPARINUNFRC 0.49   < >  --  0.72* 0.31 0.44  CREATININE 1.03*  --   --  0.96  --  1.06*   < > = values in this interval not displayed.    Estimated Creatinine Clearance: 33.2 mL/min (A) (by C-G formula based on SCr of 1.06 mg/dL (H)).  Assessment: 79 yr old female on heparin for bilateral cardiogenic emboli, S/P bilateral embolectomy 6/4. No AC PTA.   Heparin level therapeutic (0.44) on gtt at 950 units/hr. No bleeding noted.  Goal of Therapy:  Heparin level 0.3-0.7 units/ml Monitor platelets by anticoagulation protocol: Yes   Plan:  Continue heparin infusion at 950 units/hr F/u daily heparin level  8/4, PharmD, BCPS Please see amion for complete clinical pharmacist phone list  01/31/2020 1:46 AM

## 2020-01-31 NOTE — Progress Notes (Signed)
Pt transferred from bed to chair with 2-assist.  Pt able to partially hold her weight and take a few very small steps.  Pt tolerated transfer well.  Will continue to monitor.

## 2020-01-31 NOTE — Progress Notes (Addendum)
Progress Note  Patient Name: Barbara Hill Date of Encounter: 01/31/2020  Primary Cardiologist: Dietrich Pates, MD   Subjective   Lethargic today however will arouse to loud voice. Has pain in lower legs. Hb dropped again today. Denies chest pain or SOB.   Inpatient Medications    Scheduled Meds:  sodium chloride   Intravenous Once   amoxicillin  500 mg Oral Q8H   Chlorhexidine Gluconate Cloth  6 each Topical Daily   clopidogrel  75 mg Oral Daily   flecainide  50 mg Oral BID   FLUoxetine  20 mg Oral BID   metoprolol succinate  25 mg Oral QHS   pantoprazole  40 mg Oral Daily   sodium chloride flush  3 mL Intravenous Q12H   Continuous Infusions:  heparin 900 Units/hr (01/30/20 1218)   PRN Meds: acetaminophen **OR** acetaminophen, ALPRAZolam, morphine injection, ondansetron **OR** ondansetron (ZOFRAN) IV, oxyCODONE   Vital Signs    Vitals:   01/31/20 0200 01/31/20 0246 01/31/20 0348 01/31/20 0453  BP: (!) 137/53 (!) 135/53 (!) 144/55 (!) 127/58  Pulse: 66 65 66 67  Resp: 16 15 18 18   Temp: 99.1 F (37.3 C) 98.8 F (37.1 C) 98.5 F (36.9 C) 98.4 F (36.9 C)  TempSrc: Oral Oral Oral Oral  SpO2: 100% 92% 100% 100%  Weight:      Height:        Intake/Output Summary (Last 24 hours) at 01/31/2020 0748 Last data filed at 01/31/2020 0500 Gross per 24 hour  Intake 689.56 ml  Output 425 ml  Net 264.56 ml   Filed Weights   01/24/20 2115 01/29/20 1046  Weight: 48.5 kg 48.1 kg    Physical Exam   General: Frail, elderly, NAD Skin: Warm, dry, intact, bilateral LE incision, popliteal and mid calf without redness or drainage.  Lungs: Diminished bilaterally. No wheezes, rales, or rhonchi. Breathing is unlabored. Cardiovascular: RRR with S1 S2. No murmurs.  Abdomen: Soft, non-tender, non-distended. No obvious abdominal masses. Extremities: L>R BLE edema. Radial pulses 2+ bilaterally Neuro: Alert and oriented. No focal deficits. No facial asymmetry. MAE  spontaneously. Psych: Responds to questions appropriately with normal affect.    Labs    Chemistry Recent Labs  Lab 01/29/20 0316 01/30/20 0349 01/31/20 0012  NA 138 137 139  K 3.7 3.3* 4.6  CL 101 102 102  CO2 28 25 25   GLUCOSE 101* 99 128*  BUN 17 18 18   CREATININE 1.03* 0.96 1.06*  CALCIUM 8.7* 8.7* 8.8*  PROT 5.9* 5.7* 5.6*  ALBUMIN 2.5* 2.4* 2.4*  AST 26 19 17   ALT 11 11 12   ALKPHOS 50 45 46  BILITOT 0.4 0.6 0.7  GFRNONAA 52* 57* 50*  GFRAA >60 >60 58*  ANIONGAP 9 10 12      Hematology Recent Labs  Lab 01/29/20 0316 01/30/20 0349 01/31/20 0012  WBC 7.9 8.4 13.1*  RBC 2.79* 2.51* 2.25*  HGB 8.0* 7.3* 6.4*  HCT 24.7* 22.5* 20.3*  MCV 88.5 89.6 90.2  MCH 28.7 29.1 28.4  MCHC 32.4 32.4 31.5  RDW 13.4 13.7 13.9  PLT 248 273 252    Cardiac EnzymesNo results for input(s): TROPONINI in the last 168 hours. No results for input(s): TROPIPOC in the last 168 hours.   BNPNo results for input(s): BNP, PROBNP in the last 168 hours.   DDimer No results for input(s): DDIMER in the last 168 hours.   Radiology    ECHO TEE  Result Date: 01/29/2020  TRANSESOPHOGEAL ECHO REPORT   Patient Name:   Barbara Hill Date of Exam: 01/29/2020 Medical Rec #:  [id number]      Height:       63.0 in Accession #:    [id number]     Weight:       107.0 lb Date of Birth:  July 11, 1941       BSA:          1.482 m Patient Age:    79 years       BP:           175/81 mmHg Patient Gender: F              HR:           65 bpm. Exam Location:  Inpatient Procedure: Transesophageal Echo Indications:    thrombus  History:        Patient has prior history of Echocardiogram examinations, most                 recent 01/25/2020. COPD; Risk Factors:Hypertension and Current                 Smoker. MVP. PSVT.  Sonographer:    Jannett Celestine RDCS (AE) Referring Phys: [place]: After discussion of the risks and benefits of a TEE, an informed consent was obtained from the patient. TEE procedure time  was 24 minutes. The transesophogeal probe was passed without difficulty through the esophogus of the patient. Imaged were obtained with the patient in a left lateral decubitus position. Sedation performed by different physician. The patient developed no complications during the procedure. IMPRESSIONS  1. Left ventricular ejection fraction, by estimation, is 65 to 70%. The left ventricle has normal function. The left ventricle has no regional wall motion abnormalities.  2. Right ventricular systolic function is normal. The right ventricular size is normal.  3. Left atrial size was mildly dilated. No left atrial/left atrial appendage thrombus was detected. The LAA emptying velocity was 42 cm/s.  4. The mitral valve is grossly normal. Mild mitral valve regurgitation.  5. The aortic valve is normal in structure. Aortic valve regurgitation is mild to moderate.  6. There is mild (Grade II) atheroma plaque involving the transverse and descending aorta.  7. Agitated saline contrast bubble study was negative, with no evidence of any interatrial shunt. Conclusion(s)/Recommendation(s): No LA/LAA thrombus identified. Negative bubble study for interatrial shunt. No intracardiac source of embolism detected on this on this transesophageal echocardiogram. FINDINGS  Left Ventricle: Left ventricular ejection fraction, by estimation, is 65 to 70%. The left ventricle has normal function. The left ventricle has no regional wall motion abnormalities. The left ventricular internal cavity size was normal in size. Right Ventricle: The right ventricular size is normal. No increase in right ventricular wall thickness. Right ventricular systolic function is normal. Left Atrium: Left atrial size was mildly dilated. No left atrial/left atrial appendage thrombus was detected. The LAA emptying velocity was 42 cm/s. Right Atrium: Right atrial size was normal in size. Pericardium: A small pericardial effusion is present. Mitral Valve: The mitral  valve is grossly normal. Mild mitral valve regurgitation. Tricuspid Valve: The tricuspid valve is normal in structure. Tricuspid valve regurgitation is mild. Aortic Valve: The aortic valve is normal in structure. Aortic valve regurgitation is mild to moderate. Pulmonic Valve: The pulmonic valve was normal in structure. Pulmonic valve regurgitation is trivial. Aorta: The aortic root and ascending aorta are structurally normal, with no evidence of dilitation.  There is mild (Grade II) atheroma plaque involving the transverse and descending aorta. IAS/Shunts: No atrial level shunt detected by color flow Doppler. Agitated saline contrast was given intravenously to evaluate for intracardiac shunting. Agitated saline contrast bubble study was negative, with no evidence of any interatrial shunt.   AORTA Ao Root diam: 3.10 cm Ao Asc diam:  3.00 cm Weston Brass MD Electronically signed by Weston Brass MD Signature Date/Time: 01/29/2020/9:21:09 PM    Final    Telemetry    01/31/20 NSR with no arrhythmias noted  - Personally Reviewed  ECG    No new tracing as of 01/31/20 - Personally Reviewed  Cardiac Studies   TEE 01/29/20:  FINDINGS:  No LV apical thrombus No LA appendage thrombus or LA mural thrombus.  Mild-moderate aortic valve regurgitation.  No mobile valvular lesions. Negative for PFO.   Patient Profile     79 y.o. female with hx of HTN, dizziness, PSVT.   Admitted woith acute ischemia to legs   S/p embolectomy  Assessment & Plan    1  Bilateral popliteal emboli: -Pt presented with bilateral leg pain found to have bilateral popliteal emboli>>now s/p embolectomy 01/25/20>>had recurrent leg pain with 4 compartment fasciotomies, performed on 6/4 per [facility]  -Aanticoagulated with IV Heparin with plans to transition to DOAC when ok with surgical team however would continue IV Hep for now until anemia source identified and stabilized>>when ok per primary and surgical teams>>start DOAC  -TEE performed  01/26/20 with no source of emboli   2. SVT: -Paroxysmal>>>continue with PTA flecainide 50mg  PO BID -Tele review with NSR  3. HTN: -Stable, 127/58>144/55>135/53 -Continue BB   4. Acute blood loss anemia: -Hb, 6.4 today >>>down from 7.3 yesterday  -Has received PRBCx1 6/6  -UA may be source -Per primary team   5. CKD Stage II: -Creatinine, 1.06 today  -Baseline appears to be in the 1.2-1.3 range    Signed, NP-C [facility] Pager: [phone] 01/31/2020, 7:48 AM     For questions or updates, please contact   Please consult [email or web address] for contact info under Cardiology/STEMI.  Patient seen and examined  Pt resting    ON exam;  Lungs CTA Neck  JVP normal Cardiac RRR   No signif murmurs Ex are without edema  Hgb this am 6.4   Repeat 8    Follow closely   On heparin now   No new recommendations.  04/01/2020 MD

## 2020-01-31 NOTE — Progress Notes (Signed)
Physical Therapy Treatment Patient Details Name: Barbara Hill MRN: [id number] DOB: 07/06/1941 Today's Date: 01/31/2020    History of Present Illness Patient is a 79 y/o female who presented with acute ischemic lower extremities now s/p bilateral popliteal exploration and embolectomy 01/25/20. 24 hours later developed compartment syndrome and went for bilateral 4 compartment fasciotomy 01/26/20.  Has some post op confusion.  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    PT Comments    Pt sitting in recliner on arrival.  Pt needed encouragement to allow ROM to bil LE and work toward standing.  Ended session getting pt back to bed with 2 person assist to ease pt's transition.   Follow Up Recommendations  CIR;Supervision/Assistance - 24 hour     Equipment Recommendations  Rolling walker with 5" wheels    Recommendations for Other Services       Precautions / Restrictions Precautions Precautions: Fall Restrictions Weight Bearing Restrictions: No    Mobility  Bed Mobility Overal bed mobility: Needs Assistance Bed Mobility: Sit to Supine       Sit to supine: Max assist   General bed mobility comments: for trunk and LE support  Transfers Overall transfer level: Needs assistance Equipment used: Rolling walker (2 wheeled);2 person hand held assist Transfers: Sit to/from [employer] Sit to Stand: Max assist;+2 physical assistance Stand pivot transfers: Max assist;+2 physical assistance       General transfer comment: pt stood to [employer], use of +2 face to face for stand pivot back to bed from recliner  Ambulation/Gait                 Stairs             Wheelchair Mobility    Modified Rankin (Stroke Patients Only)       Balance Overall balance assessment: Needs assistance Sitting-balance support: Feet supported;Feet unsupported;Single extremity supported Sitting  balance-Leahy Scale: Fair Sitting balance - Comments: LOB forward when returning to sitting   Standing balance support: Bilateral upper extremity supported Standing balance-Leahy Scale: Poor Standing balance comment: external assist required                             Cognition Arousal/Alertness: Awake/alert Behavior During Therapy: Anxious Overall Cognitive Status: Impaired/Different from baseline Area of Impairment: Attention;Following commands;Problem solving;Memory;Orientation                   Current Attention Level: Sustained Memory: Decreased short-term memory Following Commands: Follows one step commands with increased time     Problem Solving: Slow processing;Difficulty sequencing;Requires verbal cues;Requires tactile cues General Comments: pleasantly confused       Exercises Other Exercises Other Exercises: gentle AAROM bil LEs    General Comments General comments (skin integrity, edema, etc.): VSS, incision sites clean and intact       Pertinent Vitals/Pain Pain Assessment: Faces Faces Pain Scale: Hurts whole lot Pain Location: bilateral LE's L>R Pain Descriptors / Indicators: Aching;Grimacing;Discomfort Pain Intervention(s): Monitored during session    Home Living                      Prior Function            PT Goals (current goals can now be found in the care plan section) Acute Rehab PT Goals Patient Stated Goal: to go home PT Goal Formulation:  With patient/family Time For Goal Achievement: 02/10/20 Potential to Achieve Goals: Good Progress towards PT goals: Progressing toward goals    Frequency    Min 3X/week      PT Plan Current plan remains appropriate    Co-evaluation PT/OT/SLP Co-Evaluation/Treatment: Yes Reason for Co-Treatment: For patient/therapist safety PT goals addressed during session: Mobility/safety with mobility OT goals addressed during session: ADL's and self-care      AM-PAC PT "6  Clicks" Mobility   Outcome Measure  Help needed turning from your back to your side while in a flat bed without using bedrails?: A Lot Help needed moving from lying on your back to sitting on the side of a flat bed without using bedrails?: A Lot Help needed moving to and from a bed to a chair (including a wheelchair)?: Total Help needed standing up from a chair using your arms (e.g., wheelchair or bedside chair)?: Total Help needed to walk in hospital room?: Total Help needed climbing 3-5 steps with a railing? : Total 6 Click Score: 8    End of Session   Activity Tolerance: Patient limited by pain Patient left: with call bell/phone within reach;with family/visitor present;in bed;with bed alarm set Nurse Communication: Mobility status PT Visit Diagnosis: Other abnormalities of gait and mobility (R26.89);Muscle weakness (generalized) (M62.81);Difficulty in walking, not elsewhere classified (R26.2);Pain Pain - Right/Left: (both)     Time: 9326-7124 PT Time Calculation (min) (ACUTE ONLY): 32 min  Charges:  $Therapeutic Activity: 8-22 mins                     01/31/2020  Ginger Carne., PT Acute Rehabilitation Services [phone]  (pager) [phone]  (office)   Tessie Fass Prabhnoor Ellenberger 01/31/2020, 7:00 PM

## 2020-01-31 NOTE — Progress Notes (Signed)
ANTICOAGULATION CONSULT NOTE - Follow Up Consult  Pharmacy Consult for Heparin Indication: s/p BL popliteal embolectomy  Allergies  Allergen Reactions  . Dobutamine Other (See Comments)    Heart beating hard with CP, neck pain, and weakness  . Epinephrine Other (See Comments)    Fast heart beat  . Darvocet [Propoxyphene N-Acetaminophen] Nausea And Vomiting  . Excedrin Extra Strength [Aspirin-Acetaminophen-Caffeine] Nausea And Vomiting  . Aspirin   . Clarithromycin Other (See Comments)    Yeast infection  . Doxycycline Other (See Comments)    Makes stomach hurt  . Penicillins Other (See Comments)    Yeast infection  . Prednisone Other (See Comments)    Shakes   . Propoxyphene Nausea And Vomiting    Patient Measurements: Height: 5\' 3"  (160 cm) Weight: 48.1 kg (106 lb) IBW/kg (Calculated) : 52.4 Heparin Dosing Weight:  48.5 kg  Vital Signs: Temp: 98.4 F (36.9 C) (06/09 0453) Temp Source: Oral (06/09 0453) BP: 127/58 (06/09 0453) Pulse Rate: 67 (06/09 0453)  Labs: Recent Labs    01/29/20 0316 01/29/20 0316 01/29/20 0835 01/30/20 0349 01/30/20 1445 01/31/20 0012  HGB 8.0*   < >  --  7.3*  --  6.4*  HCT 24.7*  --   --  22.5*  --  20.3*  PLT 248  --   --  273  --  252  LABPROT  --   --  15.5*  --   --   --   INR  --   --  1.3*  --   --   --   HEPARINUNFRC 0.49   < >  --  0.72* 0.31 0.44  CREATININE 1.03*  --   --  0.96  --  1.06*   < > = values in this interval not displayed.    Estimated Creatinine Clearance: 33.2 mL/min (A) (by C-G formula based on SCr of 1.06 mg/dL (H)).  Assessment: 79 yr old female on heparin for bilateral cardiogenic emboli, S/P bilateral embolectomy 6/4 and 4 compartment fasciotomy. No AC PTA. She is noted with hematoma of L calf and hematuria -hg= 6.4 -heparin level= 0.44   Goal of Therapy:  Heparin level = 0.3-0.5 Monitor platelets by anticoagulation protocol: Yes   Plan:  Continue heparin at 950 units/hr Will change heparin  goal to 0.3-0.5 Monitor daily heparin level, CBC Monitor for signs/symptoms of bleeding F/U plans for oral anticoagulation prior to discharge  8/4, PharmD Clinical Pharmacist **Pharmacist phone directory can now be found on [email or web address] ([email or web address]).  Listed under [facility].

## 2020-02-01 ENCOUNTER — Inpatient Hospital Stay (HOSPITAL_COMMUNITY): Payer: Medicare Other

## 2020-02-01 LAB — RETICULOCYTES
Immature Retic Fract: 31.1 % — ABNORMAL HIGH (ref 2.3–15.9)
RBC.: 2.69 MIL/uL — ABNORMAL LOW (ref 3.87–5.11)
Retic Count, Absolute: 86.9 10*3/uL (ref 19.0–186.0)
Retic Ct Pct: 3.2 % — ABNORMAL HIGH (ref 0.4–3.1)

## 2020-02-01 LAB — VITAMIN B12: Vitamin B-12: 223 pg/mL (ref 180–914)

## 2020-02-01 LAB — HEPARIN LEVEL (UNFRACTIONATED)
Heparin Unfractionated: <0.1 IU/mL — ABNORMAL LOW (ref 0.30–0.70)
Heparin Unfractionated: 0.19 IU/mL — ABNORMAL LOW (ref 0.30–0.70)
Heparin Unfractionated: 0.32 IU/mL (ref 0.30–0.70)

## 2020-02-01 LAB — BPAM RBC
Blood Product Expiration Date: 202106212359
Blood Product Expiration Date: 202107122359
ISSUE DATE / TIME: 202106061222
ISSUE DATE / TIME: 202106090214
Unit Type and Rh: 8400
Unit Type and Rh: 8400

## 2020-02-01 LAB — TYPE AND SCREEN
ABO/RH(D): AB POS
Antibody Screen: NEGATIVE
Unit division: 0
Unit division: 0

## 2020-02-01 LAB — CBC
HCT: 22.5 % — ABNORMAL LOW (ref 36.0–46.0)
HCT: 23.8 % — ABNORMAL LOW (ref 36.0–46.0)
Hemoglobin: 7.1 g/dL — ABNORMAL LOW (ref 12.0–15.0)
Hemoglobin: 7.5 g/dL — ABNORMAL LOW (ref 12.0–15.0)
MCH: 27.3 pg (ref 26.0–34.0)
MCH: 27.5 pg (ref 26.0–34.0)
MCHC: 31.6 g/dL (ref 30.0–36.0)
MCHC: 31.5 g/dL (ref 30.0–36.0)
MCV: 86.5 fL (ref 80.0–100.0)
MCV: 87.2 fL (ref 80.0–100.0)
Platelets: 249 10*3/uL (ref 150–400)
Platelets: 273 10*3/uL (ref 150–400)
RBC: 2.6 MIL/uL — ABNORMAL LOW (ref 3.87–5.11)
RBC: 2.73 MIL/uL — ABNORMAL LOW (ref 3.87–5.11)
RDW: 16.4 % — ABNORMAL HIGH (ref 11.5–15.5)
RDW: 16.4 % — ABNORMAL HIGH (ref 11.5–15.5)
WBC: 11.7 10*3/uL — ABNORMAL HIGH (ref 4.0–10.5)
WBC: 10.7 10*3/uL — ABNORMAL HIGH (ref 4.0–10.5)
nRBC: 0 % (ref 0.0–0.2)
nRBC: 0.2 % (ref 0.0–0.2)

## 2020-02-01 LAB — BASIC METABOLIC PANEL
Anion gap: 7 (ref 5–15)
BUN: 19 mg/dL (ref 8–23)
CO2: 25 mmol/L (ref 22–32)
Calcium: 8.6 mg/dL — ABNORMAL LOW (ref 8.9–10.3)
Chloride: 104 mmol/L (ref 98–111)
Creatinine, Ser: 0.94 mg/dL (ref 0.44–1.00)
GFR calc Af Amer: >60 mL/min (ref >60)
GFR calc non Af Amer: 58 mL/min — ABNORMAL LOW (ref >60)
Glucose, Bld: 124 mg/dL — ABNORMAL HIGH (ref 70–99)
Potassium: 3.4 mmol/L — ABNORMAL LOW (ref 3.5–5.1)
Sodium: 136 mmol/L (ref 135–145)

## 2020-02-01 LAB — MAGNESIUM: Magnesium: 1.8 mg/dL (ref 1.7–2.4)

## 2020-02-01 LAB — IRON AND TIBC
Iron: 15 ug/dL — ABNORMAL LOW (ref 28–170)
Saturation Ratios: 6 % — ABNORMAL LOW (ref 10.4–31.8)
TIBC: 246 ug/dL — ABNORMAL LOW (ref 250–450)
UIBC: 231 ug/dL

## 2020-02-01 LAB — FERRITIN: Ferritin: 97 ng/mL (ref 11–307)

## 2020-02-01 LAB — FOLATE: Folate: 6.4 ng/mL (ref >5.9)

## 2020-02-01 MED ORDER — CYANOCOBALAMIN 1000 MCG/ML IJ SOLN
1000.0000 ug | Freq: Once | INTRAMUSCULAR | Status: AC
  Administered 2020-02-01: 1000 ug via SUBCUTANEOUS
  Filled 2020-02-01: qty 1

## 2020-02-01 MED ORDER — VITAMIN B-12 1000 MCG PO TABS
1000.0000 ug | ORAL_TABLET | Freq: Every day | ORAL | Status: DC
  Administered 2020-02-02 – 2020-02-10 (×9): 1000 ug via ORAL
  Filled 2020-02-01 (×9): qty 1

## 2020-02-01 MED ORDER — POTASSIUM CHLORIDE CRYS ER 20 MEQ PO TBCR
40.0000 meq | EXTENDED_RELEASE_TABLET | Freq: Once | ORAL | Status: AC
  Administered 2020-02-01: 40 meq via ORAL
  Filled 2020-02-01: qty 2

## 2020-02-01 MED ORDER — SODIUM CHLORIDE 0.9 % IV SOLN
510.0000 mg | Freq: Once | INTRAVENOUS | Status: AC
  Administered 2020-02-01: 510 mg via INTRAVENOUS
  Filled 2020-02-01: qty 17

## 2020-02-01 NOTE — Progress Notes (Signed)
.     Progress Note  Patient Name: Barbara Hill Date of Encounter: 02/01/2020  Primary Cardiologist: Dorris Carnes, MD   Subjective   Awake  No Pain  No SOB   Asks about baby   30 lives with daughter    Inpatient Medications    Scheduled Meds: . sodium chloride   Intravenous Once  . amoxicillin  500 mg Oral Q8H  . Chlorhexidine Gluconate Cloth  6 each Topical Daily  . clopidogrel  75 mg Oral Daily  . flecainide  50 mg Oral BID  . FLUoxetine  20 mg Oral BID  . metoprolol succinate  25 mg Oral QHS  . pantoprazole  40 mg Oral Daily  . potassium chloride  40 mEq Oral Once  . sodium chloride flush  3 mL Intravenous Q12H   Continuous Infusions: . heparin 950 Units/hr (01/31/20 1348)     Vital Signs    Vitals:   01/31/20 2015 01/31/20 2337 02/01/20 0333 02/01/20 0812  BP: 107/61 (!) 105/48 130/61 124/61  Pulse: 81 74 62 60  Resp: 16 15 19 18   Temp: 98.2 F (36.8 C) 98.6 F (37 C) 97.9 F (36.6 C) 98.6 F (37 C)  TempSrc: Oral Oral Oral Oral  SpO2: 98% 97% 96% 99%  Weight:      Height:        Intake/Output Summary (Last 24 hours) at 02/01/2020 0854 Last data filed at 02/01/2020 1941 Gross per 24 hour  Intake --  Output 400 ml  Net -400 ml   Filed Weights   01/24/20 2115 01/29/20 1046  Weight: 48.5 kg 48.1 kg    Physical Exam   General: Frail, elderly, NAD Skin: Warm, Incisions healing  .  Lungs: Diminished bilaterally. No wheezes, rales,  Cardiovascular: RRR with S1 S2. No murmurs.  Abdomen: Soft, non-tender, non-distended. No obvious abdominal masses. Extremities:1+ LLE edema. Radial pulses 2+ bilaterally Neuro   Oriented x 1  No other focal deficits   Labs    Chemistry Recent Labs  Lab 01/29/20 0316 01/29/20 0316 01/30/20 0349 01/31/20 0012 02/01/20 0318  NA 138   < > 137 139 136  K 3.7   < > 3.3* 4.6 3.4*  CL 101   < > 102 102 104  CO2 28   < > 25 25 25   GLUCOSE 101*   < > 99 128* 124*  BUN 17   < > 18 18 19   CREATININE 1.03*   < >  0.96 1.06* 0.94  CALCIUM 8.7*   < > 8.7* 8.8* 8.6*  PROT 5.9*  --  5.7* 5.6*  --   ALBUMIN 2.5*  --  2.4* 2.4*  --   AST 26  --  19 17  --   ALT 11  --  11 12  --   ALKPHOS 50  --  45 46  --   BILITOT 0.4  --  0.6 0.7  --   GFRNONAA 52*   < > 57* 50* 58*  GFRAA >60   < > >60 58* >60  ANIONGAP 9   < > 10 12 7    < > = values in this interval not displayed.     Hematology Recent Labs  Lab 01/30/20 0349 01/30/20 0349 01/31/20 0012 01/31/20 0731 02/01/20 0318  WBC 8.4  --  13.1*  --  11.7*  RBC 2.51*  --  2.25*  --  2.60*  HGB 7.3*   < > 6.4* 8.0* 7.1*  HCT 22.5*   < > 20.3* 24.9* 22.5*  MCV 89.6  --  90.2  --  86.5  MCH 29.1  --  28.4  --  27.3  MCHC 32.4  --  31.5  --  31.6  RDW 13.7  --  13.9  --  16.4*  PLT 273  --  252  --  249   < > = values in this interval not displayed.    Cardiac EnzymesNo results for input(s): TROPONINI in the last 168 hours. No results for input(s): TROPIPOC in the last 168 hours.   BNPNo results for input(s): BNP, PROBNP in the last 168 hours.   DDimer No results for input(s): DDIMER in the last 168 hours.   Radiology    No results found. Telemetry    01/31/20 NSR with no arrhythmias noted  - Personally Reviewed  ECG    No new tracing as of 01/31/20 - Personally Reviewed  Cardiac Studies   TEE 01/29/20:  FINDINGS:  No LV apical thrombus No LA appendage thrombus or LA mural thrombus.  Mild-moderate aortic valve regurgitation.  No mobile valvular lesions. Negative for PFO.   Patient Profile     79 y.o. female with hx of HTN, dizziness, PSVT.   Admitted woith acute ischemia to legs   S/p embolectomy  Assessment & Plan    1  Bilateral popliteal emboli: -Pt presented with bilateral leg pain found to have bilateral popliteal emboli>>now s/p embolectomy 01/25/20>>had recurrent leg pain with 4 compartment fasciotomies, performed on 6/4 per [facility]  TEE without source   With event optimally would anticoagulate. Curretnly on heparin    Unfort  Hgb has remained low/dropped     2  Anemia     Hgb continues to remain low    She remains on IV heparin   Saw hemoccult ordered  Would continue   I have ordered anemia panel.    3. HTN: -BP is under fair control   I would not push further given hx of orthostatic hypotension, age, anemia    4  Jx SVT   Remote  No occurrence here   - 5. CKD Stage II: -Creatinine, 1.06 today  -Baseline appears to be in the 1.2-1.3 range   6  Dispo   Pt confused at times   Not fully oriented   Frail  LIves with daughter.  Concern for abilities to go home   Dietrich Pates MD

## 2020-02-01 NOTE — Progress Notes (Addendum)
  Progress Note    02/01/2020 7:30 AM 3 Days Post-Op  Subjective:  Says L leg pain has improved   Vitals:   01/31/20 2337 02/01/20 0333  BP: (!) 105/48 130/61  Pulse: 74 62  Resp: 15 19  Temp: 98.6 F (37 C) 97.9 F (36.6 C)  SpO2: 97% 96%   Physical Exam: Lungs:  Non labored Incisions:  BLE incisions healing well Extremities:  Palpable R DP; palpable L PT Neurologic: A&O  CBC    Component Value Date/Time   WBC 11.7 (H) 02/01/2020 0318   RBC 2.60 (L) 02/01/2020 0318   HGB 7.1 (L) 02/01/2020 0318   HGB 10.6 (L) 04/10/2019 1047   HCT 22.5 (L) 02/01/2020 0318   HCT 31.9 (L) 04/10/2019 1047   PLT 249 02/01/2020 0318   PLT 308 04/10/2019 1047   MCV 86.5 02/01/2020 0318   MCV 91 04/10/2019 1047   MCV 92 10/23/2013 0803   MCH 27.3 02/01/2020 0318   MCHC 31.6 02/01/2020 0318   RDW 16.4 (H) 02/01/2020 0318   RDW 11.3 (L) 04/10/2019 1047   RDW 14.0 10/23/2013 0803   LYMPHSABS 2.6 01/24/2020 2200   LYMPHSABS 2.4 10/23/2013 0803   MONOABS 0.7 01/24/2020 2200   MONOABS 0.7 10/23/2013 0803   EOSABS 0.3 01/24/2020 2200   EOSABS 0.3 10/23/2013 0803   BASOSABS 0.0 01/24/2020 2200   BASOSABS 0.1 10/23/2013 0803    BMET    Component Value Date/Time   NA 136 02/01/2020 0318   NA 139 04/10/2019 1047   NA 140 10/23/2013 0803   K 3.4 (L) 02/01/2020 0318   K 4.6 10/23/2013 0803   CL 104 02/01/2020 0318   CL 107 10/23/2013 0803   CO2 25 02/01/2020 0318   CO2 28 10/23/2013 0803   GLUCOSE 124 (H) 02/01/2020 0318   GLUCOSE 87 10/23/2013 0803   BUN 19 02/01/2020 0318   BUN 19 04/10/2019 1047   BUN 6 (L) 10/23/2013 0803   CREATININE 0.94 02/01/2020 0318   CREATININE 0.66 07/24/2016 1125   CALCIUM 8.6 (L) 02/01/2020 0318   CALCIUM 8.8 10/23/2013 0803   GFRNONAA 58 (L) 02/01/2020 0318   GFRNONAA >60 10/23/2013 0803   GFRAA >60 02/01/2020 0318   GFRAA >60 10/23/2013 0803    INR    Component Value Date/Time   INR 1.3 (H) 01/29/2020 0835   INR 1.1 10/22/2013 0125     No intake or output data in the 24 hours ending 02/01/20 0730   Assessment/Plan:  79 y.o. female is s/p BLE thrombectomies and subsequent fasciotomies; also3 Days Post-Op after TEE   BLE well perfused with palpable pedal pulses Pain in LLE improved Encouraged mobility Hgb again drifting; unknown source; defer to primary team   Emilie Rutter, PA-C [facility] [phone] 02/01/2020 7:30 AM  I have examined the patient, reviewed and agree with above.  Still with some confusion.  Adequate flow to both lower extremities.  Gretta Began, MD 02/01/2020 11:21 AM

## 2020-02-01 NOTE — Progress Notes (Signed)
ANTICOAGULATION CONSULT NOTE - Follow Up Consult  Pharmacy Consult for Heparin Indication: s/p BL popliteal embolectomy  Allergies  Allergen Reactions  . Dobutamine Other (See Comments)    Heart beating hard with CP, neck pain, and weakness  . Epinephrine Other (See Comments)    Fast heart beat  . Darvocet [Propoxyphene N-Acetaminophen] Nausea And Vomiting  . Excedrin Extra Strength [Aspirin-Acetaminophen-Caffeine] Nausea And Vomiting  . Aspirin   . Clarithromycin Other (See Comments)    Yeast infection  . Doxycycline Other (See Comments)    Makes stomach hurt  . Penicillins Other (See Comments)    Yeast infection  . Prednisone Other (See Comments)    Shakes   . Propoxyphene Nausea And Vomiting    Patient Measurements: Height: 5\' 3"  (160 cm) Weight: 48.1 kg (106 lb) IBW/kg (Calculated) : 52.4 Heparin Dosing Weight:  48.5 kg  Vital Signs: Temp: 98.6 F (37 C) (06/10 0812) Temp Source: Oral (06/10 0812) BP: 124/61 (06/10 0812) Pulse Rate: 60 (06/10 0812)  Labs: Recent Labs    01/30/20 0349 01/30/20 0349 01/30/20 1445 01/31/20 0012 01/31/20 0012 01/31/20 0731 02/01/20 0318  HGB 7.3*   < >  --  6.4*   < > 8.0* 7.1*  HCT 22.5*   < >  --  20.3*  --  24.9* 22.5*  PLT 273  --   --  252  --   --  249  HEPARINUNFRC 0.72*   < > 0.31 0.44  --   --  <0.10*  CREATININE 0.96  --   --  1.06*  --   --  0.94   < > = values in this interval not displayed.    Estimated Creatinine Clearance: 37.5 mL/min (by C-G formula based on SCr of 0.94 mg/dL).  Assessment: 79 yr old female on heparin for bilateral cardiogenic emboli, S/P bilateral embolectomy 6/4 and 4 compartment fasciotomy. No AC PTA. She is noted with hematoma of L calf and hematuria. FOBT and iron studies have been orders -hg= 6.4>>8.0>>7.1 -heparin level < 0.1. Previously at goal on 950 units/hr (no infusion problems noted per RN)   Goal of Therapy:  Heparin level = 0.3-0.5 Monitor platelets by anticoagulation  protocol: Yes   Plan:  Continue heparin at 950 units/hr (no adjustment due to low hemoglobin ans was at goal previously Recheck heparin level in 8hr F/U plans for oral anticoagulation prior to discharge  8/4, PharmD Clinical Pharmacist **Pharmacist phone directory can now be found on [email or web address] ([email or web address]).  Listed under [facility].

## 2020-02-01 NOTE — Progress Notes (Signed)
ANTICOAGULATION CONSULT NOTE - Follow Up Consult  Pharmacy Consult for Heparin Indication: s/p BL popliteal embolectomy  Allergies  Allergen Reactions  . Dobutamine Other (See Comments)    Heart beating hard with CP, neck pain, and weakness  . Epinephrine Other (See Comments)    Fast heart beat  . Darvocet [Propoxyphene N-Acetaminophen] Nausea And Vomiting  . Excedrin Extra Strength [Aspirin-Acetaminophen-Caffeine] Nausea And Vomiting  . Aspirin   . Clarithromycin Other (See Comments)    Yeast infection  . Doxycycline Other (See Comments)    Makes stomach hurt  . Penicillins Other (See Comments)    Yeast infection  . Prednisone Other (See Comments)    Shakes   . Propoxyphene Nausea And Vomiting    Patient Measurements: Height: 5\' 3"  (160 cm) Weight: 48.1 kg (106 lb) IBW/kg (Calculated) : 52.4 Heparin Dosing Weight:  48.5 kg  Vital Signs: Temp: 98.5 F (36.9 C) (06/10 1109) Temp Source: Oral (06/10 1109) BP: 141/54 (06/10 1109) Pulse Rate: 55 (06/10 1109)  Labs: Recent Labs    01/30/20 0349 01/30/20 1445 01/31/20 0012 01/31/20 0012 01/31/20 0731 01/31/20 0731 02/01/20 0318 02/01/20 1113  HGB 7.3*  --  6.4*   < > 8.0*   < > 7.1* 7.5*  HCT 22.5*  --  20.3*   < > 24.9*  --  22.5* 23.8*  PLT 273  --  252  --   --   --  249 273  HEPARINUNFRC 0.72*   < > 0.44  --   --   --  <0.10* 0.19*  CREATININE 0.96  --  1.06*  --   --   --  0.94  --    < > = values in this interval not displayed.    Estimated Creatinine Clearance: 37.5 mL/min (by C-G formula based on SCr of 0.94 mg/dL).  Assessment: 79 yr old female on heparin for bilateral cardiogenic emboli, S/P bilateral embolectomy 6/4 and 4 compartment fasciotomy. No AC PTA. She is noted with hematoma of L calf and hematuria. FOBT and iron studies have been orders -hg= 6.4>>8.0>>7.1 -heparin level= 0.19   Goal of Therapy:  Heparin level = 0.3-0.5 Monitor platelets by anticoagulation protocol: Yes   Plan:   Increase heparin to 1000 units/hr Recheck heparin level in 8hr F/U plans for oral anticoagulation prior to discharge  8/4, PharmD Clinical Pharmacist **Pharmacist phone directory can now be found on [email or web address] ([email or web address]).  Listed under [facility].

## 2020-02-01 NOTE — Progress Notes (Signed)
PROGRESS NOTE    Barbara Hill  KMQ:[id number] DOB: 03/27/1941 DOA: 01/24/2020 PCP: Richmond Campbell., PA-C    Brief Narrative:  79 y.o. 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 Her right leg pain started acutely and was severe. She had also had long-term Holter monitoring performed in March of this year which showed no significant arrhythmias.  She does admit to still smoking cigarettes, but reports that she will quit as she will related with the pain that she had in her right leg that brought her to the hospital. Upon admission into the emergency department patient was noted to be afebrile with a pressure elevated up to 217/84, heart rates intermittently fluctuating from 60-130, and all other vital signs maintained.  Labs were significant for hemoglobin 10, BUN 18, and creatinine 1.21.  CT scan of the abdomen pelvis revealed bilateral popliteal embolus.  Patient was taken immediately to the operating grading room by vascular surgery for bilateral popliteal embolectomy.  Assessment & Plan:   Principal Problem:   Ischemic leg Active Problems:   Paroxysmal SVT (supraventricular tachycardia) (HCC)   Tobacco use disorder   COPD (chronic obstructive pulmonary disease) (HCC)   Normocytic anemia   History of CVA (cerebrovascular accident)   Anxiety   CKD (chronic kidney disease), stage III  Acute bilateral ischemic legs s/p bilateral popliteal embolectomy on 01/25/20 with subsequent 4 compartment fasciotomies on 01/26/20 CT abd/pelvis showed bilateral popliteal embolus Vascular Surgery consulted and patient underwent B popliteal exploration and embolectomy on 6/3 with subsequent 4 compartment fasciotomies, performed on 6/4 Underwent TEE 6/7 with no embolic source identified Pt is continued on heparin gtt  per Vascular Surgery  Paroxysmal SVT Stable Cont with home flecainide 50mg  twice daily  Hypomagnesemia Replace prn  COPD/tobacco use Patient without signs of acute exacerbation of her COPD.   Reportedly declined nicotine patch on presentation Cont with Albuterol nebs as needed shortness of breath/wheezing Advised to quit  Memory deficit with acute toxicmetabolic encephalopathy secondary to enterococcus UTI Patient noted issues with memory and possibly confused at times Urine cx pos for enterococcus species Was on empiric rocephin. Will transition to amoxicillin. Will also give dose of diflucan given hx of yeast infections with abx  Acute blood loss anemia likely secondary to post op/recent surgery/hematoma Vs unlikely hematuria Hemoglobin dropped to 6.4 on 01/31/20, s/p 2U of PRBC, last transfused on 01/31/20, continues to drop Anemia panel ordered by cardiology despite transfusion still showed iron def, iron 15, sats 6%, Vit B12 223 FOBT pending Large blood on UA. Have discussed with Urology on call. Pt is currently treated for enterococcus UTI which is most likely the culprit for hematuria, per Urology. Urology recommendation to cont to treat for enterococcus UTI and monitor for clot formation resulting in bladder outlet obstruction. Ultimately when discharged, recommendation to follow up with pt's primary Urologist, Dr. 04/01/20 for hematuria work up. Renal USS pending Give 1 dose of feraheme, start Vit B12  Daily CBC  History of CVA  Patient with previous history of stroke following procedure in 2017  Essential hypertension BP stable  Home medications include losartan 25 mg daily and metoprolol 25 mg nightly Continued on metoprolol  Chronic kidney disease stage IIIb Patient presents with creatinine of 1.21 which appears within her baseline of 1.2-1.3. Daily BMP  Anxiety Continue Xanax 0.5 mg 3  times daily as needed and fluoxetine 20 mg twice daily  GERD Home  meds include omeprazole 20 mg daily    DVT prophylaxis: Heparin gtt Code Status: Full Family Communication: Discussed extensively with patient, plan to call family   Status is: Inpatient  Remains inpatient appropriate because:Hemodynamically unstable, Ongoing diagnostic testing needed not appropriate for outpatient work up, IV treatments appropriate due to intensity of illness or inability to take PO and Inpatient level of care appropriate due to severity of illness   Dispo: The patient is from: Home              Anticipated d/c is to: CIR              Anticipated d/c date is: > 3 days, when cleared by Vascular, cardiology              Patient currently is not medically stable to d/c.   Consultants:   Vascular Surgery  Cardiology  Discussed with urology  Procedures:  B popliteal exploration and embolectomy on 6/3 Emergent 4 compartment fasciotomy 6/4  Antimicrobials: Anti-infectives (From admission, onward)   Start     Dose/Rate Route Frequency Ordered Stop   01/30/20 1630  amoxicillin (AMOXIL) capsule 500 mg     Discontinue     500 mg Oral Every 8 hours 01/30/20 1619     01/30/20 1630  fluconazole (DIFLUCAN) tablet 150 mg        150 mg Oral  Once 01/30/20 1619 01/30/20 1733   01/28/20 1530  cefTRIAXone (ROCEPHIN) 1 g in sodium chloride 0.9 % 100 mL IVPB  Status:  Discontinued       Note to Pharmacy: Allergy list reviewed Per checklist, should be OK for cephalosporin   1 g 200 mL/hr over 30 Minutes Intravenous Every 24 hours 01/28/20 1358 01/30/20 1619   01/26/20 0918  ceFAZolin (ANCEF) 2-4 GM/100ML-% IVPB       Note to Pharmacy: Jasmine Pang   : cabinet override      01/26/20 0918 01/26/20 2129      Subjective: Pt still complains of LLE pain and swelling, denies any other new complaints, intermittently confused. Denies any chest pain, SOB, abdominal pain, fever/chills  Objective: Vitals:   01/31/20 2337 02/01/20 0333 02/01/20 0812 02/01/20 1109  BP: (!)  105/48 130/61 124/61 (!) 141/54  Pulse: 74 62 60 (!) 55  Resp: 15 19 18 18   Temp: 98.6 F (37 C) 97.9 F (36.6 C) 98.6 F (37 C) 98.5 F (36.9 C)  TempSrc: Oral Oral Oral Oral  SpO2: 97% 96% 99% 100%  Weight:      Height:        Intake/Output Summary (Last 24 hours) at 02/01/2020 1705 Last data filed at 02/01/2020 3295 Gross per 24 hour  Intake --  Output 400 ml  Net -400 ml   Filed Weights   01/24/20 2115 01/29/20 1046  Weight: 48.5 kg 48.1 kg    Examination:  General: NAD, frail, oriented, intermittent confusion  Cardiovascular: S1, S2 present  Respiratory: CTAB  Abdomen: Soft, nontender, nondistended, bowel sounds present  Musculoskeletal: Noted 4 incision in b/l thigh with staples, c/d/i, LLE 2+ edema noted   Skin: As mentioned above  Psychiatry: Normal mood    Data Reviewed: I have personally reviewed following labs and imaging studies  CBC: Recent Labs  Lab 01/29/20 0316 01/29/20 0316 01/30/20 0349 01/31/20 0012 01/31/20 0731 02/01/20 0318 02/01/20 1113  WBC 7.9  --  8.4 13.1*  --  11.7* 10.7*  HGB 8.0*   < > 7.3* 6.4* 8.0* 7.1* 7.5*  HCT 24.7*   < > 22.5* 20.3* 24.9* 22.5* 23.8*  MCV 88.5  --  89.6 90.2  --  86.5 87.2  PLT 248  --  273 252  --  249 273   < > = values in this interval not displayed.   Basic Metabolic Panel: Recent Labs  Lab 01/28/20 0603 01/29/20 0316 01/30/20 0349 01/31/20 0012 02/01/20 0318  NA 137 138 137 139 136  K 3.5 3.7 3.3* 4.6 3.4*  CL 102 101 102 102 104  CO2 27 28 25 25 25   GLUCOSE 105* 101* 99 128* 124*  BUN 16 17 18 18 19   CREATININE 1.07* 1.03* 0.96 1.06* 0.94  CALCIUM 8.7* 8.7* 8.7* 8.8* 8.6*  MG 1.3* 2.3  --  1.4* 1.8   GFR: Estimated Creatinine Clearance: 37.5 mL/min (by C-G formula based on SCr of 0.94 mg/dL). Liver Function Tests: Recent Labs  Lab 01/27/20 0304 01/28/20 0603 01/29/20 0316 01/30/20 0349 01/31/20 0012  AST 17 29 26 19 17   ALT 6 11 11 11 12   ALKPHOS 49 49 50 45 46   BILITOT 0.6 0.5 0.4 0.6 0.7  PROT 5.6* 6.1* 5.9* 5.7* 5.6*  ALBUMIN 2.5* 2.5* 2.5* 2.4* 2.4*   No results for input(s): LIPASE, AMYLASE in the last 168 hours. No results for input(s): AMMONIA in the last 168 hours. Coagulation Profile: Recent Labs  Lab 01/29/20 0835  INR 1.3*   Cardiac Enzymes: No results for input(s): CKTOTAL, CKMB, CKMBINDEX, TROPONINI in the last 168 hours. BNP (last 3 results) No results for input(s): PROBNP in the last 8760 hours. HbA1C: No results for input(s): HGBA1C in the last 72 hours. CBG: No results for input(s): GLUCAP in the last 168 hours. Lipid Profile: No results for input(s): CHOL, HDL, LDLCALC, TRIG, CHOLHDL, LDLDIRECT in the last 72 hours. Thyroid Function Tests: No results for input(s): TSH, T4TOTAL, FREET4, T3FREE, THYROIDAB in the last 72 hours. Anemia Panel: Recent Labs    02/01/20 1113 02/01/20 1124  VITAMINB12 223  --   FOLATE 6.4  --   FERRITIN 97  --   TIBC 246*  --   IRON 15*  --   RETICCTPCT  --  3.2*   Sepsis Labs: No results for input(s): PROCALCITON, LATICACIDVEN in the last 168 hours.  Recent Results (from the past 240 hour(s))  SARS Coronavirus 2 by RT PCR (hospital order, performed in [facility] hospital lab) Nasopharyngeal Nasopharyngeal Swab     Status: None   Collection Time: 01/25/20  4:23 AM   Specimen: Nasopharyngeal Swab  Result Value Ref Range Status   SARS Coronavirus 2 NEGATIVE NEGATIVE Final    Comment: (NOTE) SARS-CoV-2 target nucleic acids are NOT DETECTED. The SARS-CoV-2 RNA is generally detectable in upper and lower respiratory specimens during the acute phase of infection. The lowest concentration of SARS-CoV-2 viral copies this assay can detect is 250 copies / mL. A negative result does not preclude SARS-CoV-2 infection and should not be used as the sole basis for treatment or other patient management decisions.  A negative result may occur with improper specimen collection / handling,  submission of specimen other than nasopharyngeal swab, presence of viral mutation(s) within the areas targeted by this assay, and inadequate number of viral copies (<250 copies / mL). A negative result must be combined with clinical observations, patient history, and epidemiological information. Fact Sheet for Patients:   04/02/20 Fact Sheet  for Healthcare Providers: https://pope.com/ This test is not yet approved or cleared  by the [place] and has been authorized for detection and/or diagnosis of SARS-CoV-2 by FDA under an Emergency Use Authorization (EUA).  This EUA will remain in effect (meaning this test can be used) for the duration of the COVID-19 declaration under Section 564(b)(1) of the Act, 21 U.S.C. section 360bbb-3(b)(1), unless the authorization is terminated or revoked sooner. Performed at [facility], [address], [place], [place] [address]   Culture, Urine     Status: Abnormal   Collection Time: 01/28/20  1:57 PM   Specimen: Urine, Random  Result Value Ref Range Status   Specimen Description URINE, RANDOM  Final   Special Requests   Final    NONE Performed at [facility], [address] [place], [place] [address]    Culture 20,000 COLONIES/mL ENTEROCOCCUS FAECALIS (A)  Final   Report Status 01/30/2020 FINAL  Final   Organism ID, Bacteria ENTEROCOCCUS FAECALIS (A)  Final      Susceptibility   Enterococcus faecalis - MIC*    AMPICILLIN <=2 SENSITIVE Sensitive     NITROFURANTOIN <=16 SENSITIVE Sensitive     VANCOMYCIN 1 SENSITIVE Sensitive     * 20,000 COLONIES/mL ENTEROCOCCUS FAECALIS     Radiology Studies: No results found.  Scheduled Meds: . sodium chloride   Intravenous Once  . amoxicillin  500 mg Oral Q8H  . Chlorhexidine Gluconate Cloth  6 each Topical Daily  . clopidogrel  75 mg Oral Daily  . flecainide  50 mg Oral BID  . FLUoxetine  20 mg Oral BID  . metoprolol  succinate  25 mg Oral QHS  . pantoprazole  40 mg Oral Daily  . sodium chloride flush  3 mL Intravenous Q12H   Continuous Infusions: . heparin 1,000 Units/hr (02/01/20 1559)     LOS: 7 days   Briant Cedar, MD [facility] Pager On Amion  If 7PM-7AM, please contact night-coverage 02/01/2020, 5:05 PM

## 2020-02-01 NOTE — Progress Notes (Signed)
ANTICOAGULATION CONSULT NOTE - Follow Up Consult  Pharmacy Consult for heparin Indication: bilateral cardiogenic emboli  Labs: Recent Labs    01/30/20 0349 01/30/20 1445 01/31/20 0012 01/31/20 0012 01/31/20 0731 02/01/20 0318 02/01/20 1113 02/01/20 2251  HGB 7.3*  --  6.4*   < > 8.0* 7.1* 7.5*  --   HCT 22.5*  --  20.3*   < > 24.9* 22.5* 23.8*  --   PLT 273  --  252  --   --  249 273  --   HEPARINUNFRC 0.72*   < > 0.44   < >  --  <0.10* 0.19* 0.32  CREATININE 0.96  --  1.06*  --   --  0.94  --   --    < > = values in this interval not displayed.    Assessment/Plan:  79yo female therapeutic on heparin after rate change. Will continue gtt at current rate and confirm stable with additional level.   Vernard Gambles, PharmD, BCPS  02/01/2020,11:31 PM

## 2020-02-02 LAB — BASIC METABOLIC PANEL
Anion gap: 9 (ref 5–15)
BUN: 17 mg/dL (ref 8–23)
CO2: 25 mmol/L (ref 22–32)
Calcium: 8.7 mg/dL — ABNORMAL LOW (ref 8.9–10.3)
Chloride: 103 mmol/L (ref 98–111)
Creatinine, Ser: 0.94 mg/dL (ref 0.44–1.00)
GFR calc Af Amer: >60 mL/min (ref >60)
GFR calc non Af Amer: 58 mL/min — ABNORMAL LOW (ref >60)
Glucose, Bld: 112 mg/dL — ABNORMAL HIGH (ref 70–99)
Potassium: 4.1 mmol/L (ref 3.5–5.1)
Sodium: 137 mmol/L (ref 135–145)

## 2020-02-02 LAB — CBC
HCT: 21.5 % — ABNORMAL LOW (ref 36.0–46.0)
Hemoglobin: 6.8 g/dL — CL (ref 12.0–15.0)
MCH: 28 pg (ref 26.0–34.0)
MCHC: 31.6 g/dL (ref 30.0–36.0)
MCV: 88.5 fL (ref 80.0–100.0)
Platelets: 283 10*3/uL (ref 150–400)
RBC: 2.43 MIL/uL — ABNORMAL LOW (ref 3.87–5.11)
RDW: 16 % — ABNORMAL HIGH (ref 11.5–15.5)
WBC: 9.2 10*3/uL (ref 4.0–10.5)
nRBC: 0 % (ref 0.0–0.2)

## 2020-02-02 LAB — HEMOGLOBIN AND HEMATOCRIT, BLOOD
HCT: 28.2 % — ABNORMAL LOW (ref 36.0–46.0)
Hemoglobin: 8.8 g/dL — ABNORMAL LOW (ref 12.0–15.0)

## 2020-02-02 LAB — PREPARE RBC (CROSSMATCH)

## 2020-02-02 LAB — HEPARIN LEVEL (UNFRACTIONATED): Heparin Unfractionated: 0.3 IU/mL (ref 0.30–0.70)

## 2020-02-02 MED ORDER — SODIUM CHLORIDE 0.9% IV SOLUTION: Freq: Once | INTRAVENOUS | Status: AC

## 2020-02-02 NOTE — Progress Notes (Signed)
PROGRESS NOTE    Barbara Hill  NUU:[id number] DOB: 09-09-1940 DOA: 01/24/2020 PCP: Aletha Halim., PA-C    Brief Narrative:  79 y.o. 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 Her right leg pain started acutely and was severe. She had also had long-term Holter monitoring performed in March of this year which showed no significant arrhythmias.  She does admit to still smoking cigarettes, but reports that she will quit as she will related with the pain that she had in her right leg that brought her to the hospital. Upon admission into the emergency department patient was noted to be afebrile with a pressure elevated up to 217/84, heart rates intermittently fluctuating from 60-130, and all other vital signs maintained.  Labs were significant for hemoglobin 10, BUN 18, and creatinine 1.21.  CT scan of the abdomen pelvis revealed bilateral popliteal embolus.  Patient was taken immediately to the operating grading room by vascular surgery for bilateral popliteal embolectomy.  Assessment & Plan:   Principal Problem:   Ischemic leg Active Problems:   Paroxysmal SVT (supraventricular tachycardia) (HCC)   Tobacco use disorder   COPD (chronic obstructive pulmonary disease) (HCC)   Normocytic anemia   History of CVA (cerebrovascular accident)   Anxiety   CKD (chronic kidney disease), stage III  Acute bilateral ischemic legs s/p bilateral popliteal embolectomy on 01/25/20 with subsequent 4 compartment fasciotomies on 01/26/20 CT abd/pelvis showed bilateral popliteal embolus Vascular Surgery consulted and patient underwent B popliteal exploration and embolectomy on 6/3 with subsequent 4 compartment fasciotomies, performed on 6/4 Underwent TEE 6/7 with no embolic source identified Pt is continued on heparin gtt  per Vascular Surgery  Paroxysmal SVT Stable Cont with home flecainide 50mg  twice daily  Hypomagnesemia Replace prn  COPD/tobacco use Patient without signs of acute exacerbation of her COPD.   Reportedly declined nicotine patch on presentation Cont with Albuterol nebs as needed shortness of breath/wheezing Advised to quit  Memory deficit with acute toxicmetabolic encephalopathy secondary to enterococcus UTI Patient noted issues with memory and possibly confused at times Urine cx pos for enterococcus species Was on empiric rocephin, switched to amoxicillin. Will also give dose of diflucan given hx of yeast infections with abx  Acute blood loss anemia likely secondary to post op/recent surgery/hematoma Vs unlikely hematuria Unknown etiology Hemoglobin dropped to 6.4 on 01/31/20, s/p 2U of PRBC, last transfused on 02/02/20, continues to drop Anemia panel ordered by cardiology despite transfusion still showed iron def, iron 15, sats 6%, Vit B12 223 FOBT pending Large blood on UA. Have discussed with Urology on call. Pt is currently treated for enterococcus UTI which is most likely the culprit for hematuria, per Urology. Urology recommendation to cont to treat for enterococcus UTI and monitor for clot formation resulting in bladder outlet obstruction. Ultimately when discharged, recommendation to follow up with pt's primary Urologist, Dr. Matilde Sprang for hematuria work up. Renal USS negative for any bleeding source Gave 1 dose of feraheme, started Vit B12 on 02/01/20 Daily CBC  History of CVA  Patient with previous history of stroke following procedure in 2017  Essential hypertension BP stable  Home medications include losartan 25 mg daily and metoprolol 25 mg nightly Continued on metoprolol  Chronic kidney disease stage IIIb Patient presents with creatinine of 1.21 which appears within her baseline of 1.2-1.3. Daily BMP  Anxiety Continue Xanax 0.5 mg 3 times daily as needed  and fluoxetine 20 mg twice daily  GERD Home meds include omeprazole 20 mg daily    DVT prophylaxis: Heparin gtt Code Status: Full Family Communication: Discussed extensively with daughter on 02/02/20   Status is: Inpatient  Remains inpatient appropriate because:Hemodynamically unstable, Ongoing diagnostic testing needed not appropriate for outpatient work up, IV treatments appropriate due to intensity of illness or inability to take PO and Inpatient level of care appropriate due to severity of illness   Dispo: The patient is from: Home              Anticipated d/c is to: [facility]              Anticipated d/c date is: 3 days, when cleared by Vascular, cardiology              Patient currently is not medically stable to d/c.   Consultants:   Vascular Surgery  Cardiology  Discussed with urology  Procedures:  B popliteal exploration and embolectomy on 6/3 Emergent 4 compartment fasciotomy 6/4  Antimicrobials: Anti-infectives (From admission, onward)   Start     Dose/Rate Route Frequency Ordered Stop   01/30/20 1630  amoxicillin (AMOXIL) capsule 500 mg     Discontinue     500 mg Oral Every 8 hours 01/30/20 1619     01/30/20 1630  fluconazole (DIFLUCAN) tablet 150 mg        150 mg Oral  Once 01/30/20 1619 01/30/20 1733   01/28/20 1530  cefTRIAXone (ROCEPHIN) 1 g in sodium chloride 0.9 % 100 mL IVPB  Status:  Discontinued       Note to Pharmacy: Allergy list reviewed Per checklist, should be OK for cephalosporin   1 g 200 mL/hr over 30 Minutes Intravenous Every 24 hours 01/28/20 1358 01/30/20 1619   01/26/20 0918  ceFAZolin (ANCEF) 2-4 GM/100ML-% IVPB       Note to Pharmacy: Evern Bio   : cabinet override      01/26/20 0918 01/26/20 2129      Subjective: Pt still c/o LLE swelling and significant pain. Denies any other new complaints.    Objective: Vitals:   02/02/20 0622 02/02/20 0814 02/02/20 0850 02/02/20 1219  BP: 118/66 (!) 137/59 (!) 142/56 120/60  Pulse:  60 61 64 64  Resp: 13 18 17 18   Temp: 98.5 F (36.9 C) 99.6 F (37.6 C) 99.3 F (37.4 C) 97.7 F (36.5 C)  TempSrc: Oral Oral Oral Oral  SpO2: 94% 93% 95% 99%  Weight:      Height:        Intake/Output Summary (Last 24 hours) at 02/02/2020 1417 Last data filed at 02/02/2020 1100 Gross per 24 hour  Intake 675 ml  Output 575 ml  Net 100 ml   Filed Weights   01/24/20 2115 01/29/20 1046 02/02/20 0314  Weight: 48.5 kg 48.1 kg 50.2 kg    Examination:  General: NAD, frail, oriented, intermittent confusion  Cardiovascular: S1, S2 present  Respiratory: CTAB  Abdomen: Soft, nontender, nondistended, bowel sounds present  Musculoskeletal: Noted 4 incision in b/l thigh with staples, c/d/i, LLE 2+ edema noted   Skin: As mentioned above  Psychiatry: Normal mood    Data Reviewed: I have personally reviewed following labs and imaging studies  CBC: Recent Labs  Lab 01/30/20 0349 01/30/20 0349 01/31/20 0012 01/31/20 0012 01/31/20 0731 02/01/20 0318 02/01/20 1113 02/02/20 0349 02/02/20 1202  WBC 8.4  --  13.1*  --   --  11.7* 10.7* 9.2  --   HGB 7.3*   < > 6.4*   < > 8.0* 7.1* 7.5* 6.8* 8.8*  HCT 22.5*   < > 20.3*   < > 24.9* 22.5* 23.8* 21.5* 28.2*  MCV 89.6  --  90.2  --   --  86.5 87.2 88.5  --   PLT 273  --  252  --   --  249 273 283  --    < > = values in this interval not displayed.   Basic Metabolic Panel: Recent Labs  Lab 01/28/20 0603 01/28/20 0603 01/29/20 0316 01/30/20 0349 01/31/20 0012 02/01/20 0318 02/02/20 0349  NA 137   < > 138 137 139 136 137  K 3.5   < > 3.7 3.3* 4.6 3.4* 4.1  CL 102   < > 101 102 102 104 103  CO2 27   < > 28 25 25 25 25   GLUCOSE 105*   < > 101* 99 128* 124* 112*  BUN 16   < > 17 18 18 19 17   CREATININE 1.07*   < > 1.03* 0.96 1.06* 0.94 0.94  CALCIUM 8.7*   < > 8.7* 8.7* 8.8* 8.6* 8.7*  MG 1.3*  --  2.3  --  1.4* 1.8  --    < > = values in this interval not displayed.   GFR: Estimated Creatinine Clearance: 39.1 mL/min  (by C-G formula based on SCr of 0.94 mg/dL). Liver Function Tests: Recent Labs  Lab 01/27/20 0304 01/28/20 0603 01/29/20 0316 01/30/20 0349 01/31/20 0012  AST 17 29 26 19 17   ALT 6 11 11 11 12   ALKPHOS 49 49 50 45 46  BILITOT 0.6 0.5 0.4 0.6 0.7  PROT 5.6* 6.1* 5.9* 5.7* 5.6*  ALBUMIN 2.5* 2.5* 2.5* 2.4* 2.4*   No results for input(s): LIPASE, AMYLASE in the last 168 hours. No results for input(s): AMMONIA in the last 168 hours. Coagulation Profile: Recent Labs  Lab 01/29/20 0835  INR 1.3*   Cardiac Enzymes: No results for input(s): CKTOTAL, CKMB, CKMBINDEX, TROPONINI in the last 168 hours. BNP (last 3 results) No results for input(s): PROBNP in the last 8760 hours. HbA1C: No results for input(s): HGBA1C in the last 72 hours. CBG: No results for input(s): GLUCAP in the last 168 hours. Lipid Profile: No results for input(s): CHOL, HDL, LDLCALC, TRIG, CHOLHDL, LDLDIRECT in the last 72 hours. Thyroid Function Tests: No results for input(s): TSH, T4TOTAL, FREET4, T3FREE, THYROIDAB in the last 72 hours. Anemia Panel: Recent Labs    02/01/20 1113 02/01/20 1124  VITAMINB12 223  --   FOLATE 6.4  --   FERRITIN 97  --   TIBC 246*  --   IRON 15*  --   RETICCTPCT  --  3.2*   Sepsis Labs: No results for input(s): PROCALCITON, LATICACIDVEN in the last 168 hours.  Recent Results (from the past 240 hour(s))  SARS Coronavirus 2 by RT PCR (hospital order, performed in [facility] hospital lab) Nasopharyngeal Nasopharyngeal Swab     Status: None   Collection Time: 01/25/20  4:23 AM   Specimen: Nasopharyngeal Swab  Result Value Ref Range Status   SARS Coronavirus 2 NEGATIVE NEGATIVE Final    Comment: (NOTE) SARS-CoV-2 target nucleic acids are NOT DETECTED. The SARS-CoV-2 RNA is generally detectable in upper and lower respiratory specimens during the acute phase of infection. The lowest concentration of SARS-CoV-2 viral copies this assay can detect is 250 copies / mL. A  negative result does not preclude SARS-CoV-2 infection and should not be used as the sole basis for treatment or other patient management decisions.  A negative result may occur with improper specimen collection / handling, submission of specimen other than nasopharyngeal swab, presence of viral mutation(s) within the areas targeted by this assay, and inadequate number of viral copies (<250 copies / mL). A negative result must be combined with clinical observations, patient history, and epidemiological information. Fact Sheet for Patients:   BoilerBrush.com.cy Fact Sheet for Healthcare Providers: https://pope.com/ This test is not yet approved or cleared  by the [place] FDA and has been authorized for detection and/or diagnosis of SARS-CoV-2 by FDA under an Emergency Use Authorization (EUA).  This EUA will remain in effect (meaning this test can be used) for the duration of the COVID-19 declaration under Section 564(b)(1) of the Act, 21 U.S.C. section 360bbb-3(b)(1), unless the authorization is terminated or revoked sooner. Performed at [facility], [address] [place], [place] [address]   Culture, Urine     Status: Abnormal   Collection Time: 01/28/20  1:57 PM   Specimen: Urine, Random  Result Value Ref Range Status   Specimen Description URINE, RANDOM  Final   Special Requests   Final    NONE Performed at [facility], [address], [place], [place] [address]    Culture 20,000 COLONIES/mL ENTEROCOCCUS FAECALIS (A)  Final   Report Status 01/30/2020 FINAL  Final   Organism ID, Bacteria ENTEROCOCCUS FAECALIS (A)  Final      Susceptibility   Enterococcus faecalis - MIC*    AMPICILLIN <=2 SENSITIVE Sensitive     NITROFURANTOIN <=16 SENSITIVE Sensitive     VANCOMYCIN 1 SENSITIVE Sensitive     * 20,000 COLONIES/mL ENTEROCOCCUS FAECALIS     Radiology Studies: US RENAL  Result Date: 02/01/2020 CLINICAL DATA:   79 year old female with hematuria. EXAM: RENAL / URINARY TRACT ULTRASOUND COMPLETE COMPARISON:  01/25/2020 CT prior studies FINDINGS: Right Kidney: Renal measurements: 8.9 x 3.7 x 5 cm = volume: 85 mL. A 1.9 cm cyst within the mid RIGHT kidney is again noted. No solid mass or hydronephrosis. Renal echogenicity is UPPER limits of normal. Cortical thinning is present. Left Kidney: Renal measurements: 9.6 x 4.8 x 5.4 cm = volume: 130 mL. A 1.3 cm UPPER pole cyst is present. No solid mass or hydronephrosis. Renal echogenicity is UPPER limits normal. Cortical thinning is noted. Bladder: Appears normal for degree of bladder distention. Other: None. IMPRESSION: 1. Bilateral renal cortical thinning and UPPER limits of normal renal echogenicity which may be seen with medical renal disease. 2. No findings on this study to suggest a cause for this patient's hematuria. Clinical follow-up recommended. Electronically Signed   By: Harmon Pier M.D.   On: 02/01/2020 19:30    Scheduled Meds: . amoxicillin  500 mg Oral Q8H  . Chlorhexidine Gluconate Cloth  6 each Topical Daily  . clopidogrel  75 mg Oral Daily  . flecainide  50 mg Oral BID  . FLUoxetine  20 mg Oral BID  . metoprolol succinate  25 mg Oral QHS  . pantoprazole  40 mg Oral Daily  . sodium chloride flush  3 mL Intravenous Q12H  . vitamin B-12  1,000 mcg Oral Daily   Continuous Infusions: . heparin 1,000 Units/hr (02/01/20 1559)     LOS: 8 days   Briant Cedar, MD [facility] Pager On Amion  If 7PM-7AM, please contact night-coverage 02/02/2020, 2:17  PM  

## 2020-02-02 NOTE — Progress Notes (Signed)
.     Progress Note  Patient Name: Barbara Hill Date of Encounter: 02/02/2020  Primary Cardiologist: Dorris Carnes, MD   Subjective   Pt sitting in chair  Having breakfast   Daughter present  Denies CP   No Dizziness   Breathing is OK   Inpatient Medications    Scheduled Meds: . amoxicillin  500 mg Oral Q8H  . Chlorhexidine Gluconate Cloth  6 each Topical Daily  . clopidogrel  75 mg Oral Daily  . flecainide  50 mg Oral BID  . FLUoxetine  20 mg Oral BID  . metoprolol succinate  25 mg Oral QHS  . pantoprazole  40 mg Oral Daily  . sodium chloride flush  3 mL Intravenous Q12H  . vitamin B-12  1,000 mcg Oral Daily   Continuous Infusions: . heparin 1,000 Units/hr (02/01/20 1559)     Vital Signs    Vitals:   02/02/20 0600 02/02/20 0622 02/02/20 0814 02/02/20 0850  BP: (!) 121/53 118/66 (!) 137/59 (!) 142/56  Pulse: 65 60 61 64  Resp: 15 13 18 17   Temp: 99 F (37.2 C) 98.5 F (36.9 C) 99.6 F (37.6 C) 99.3 F (37.4 C)  TempSrc: Oral Oral Oral Oral  SpO2: 93% 94% 93% 95%  Weight:      Height:        Intake/Output Summary (Last 24 hours) at 02/02/2020 0955 Last data filed at 02/02/2020 0850 Gross per 24 hour  Intake 555 ml  Output 575 ml  Net -20 ml   Filed Weights   01/24/20 2115 01/29/20 1046 02/02/20 0314  Weight: 48.5 kg 48.1 kg 50.2 kg    Physical Exam   General: Frail, elderly, NAD Skin: Warm, Incisions healing  .  Lungs: REl clear    Cardiovascular: RRR with S1 S2. No murmurs.  Abdomen: Soft, non-tender, non-distended. No obvious abdominal masses. Extremities:LLE with some swelling  RLE OK   Radial pulses 2+ bilaterally Neuro   Oriented x 1  No other focal deficits   Labs    Chemistry Recent Labs  Lab 01/29/20 0316 01/29/20 0316 01/30/20 0349 01/30/20 0349 01/31/20 0012 02/01/20 0318 02/02/20 0349  NA 138   < > 137   < > 139 136 137  K 3.7   < > 3.3*   < > 4.6 3.4* 4.1  CL 101   < > 102   < > 102 104 103  CO2 28   < > 25   < > 25 25  25   GLUCOSE 101*   < > 99   < > 128* 124* 112*  BUN 17   < > 18   < > 18 19 17   CREATININE 1.03*   < > 0.96   < > 1.06* 0.94 0.94  CALCIUM 8.7*   < > 8.7*   < > 8.8* 8.6* 8.7*  PROT 5.9*  --  5.7*  --  5.6*  --   --   ALBUMIN 2.5*  --  2.4*  --  2.4*  --   --   AST 26  --  19  --  17  --   --   ALT 11  --  11  --  12  --   --   ALKPHOS 50  --  45  --  46  --   --   BILITOT 0.4  --  0.6  --  0.7  --   --   GFRNONAA 52*   < >  57*   < > 50* 58* 58*  GFRAA >60   < > >60   < > 58* >60 >60  ANIONGAP 9   < > 10   < > 12 7 9    < > = values in this interval not displayed.     Hematology Recent Labs  Lab 02/01/20 0318 02/01/20 0318 02/01/20 1113 02/01/20 1124 02/02/20 0349  WBC 11.7*  --  10.7*  --  9.2  RBC 2.60*   < > 2.73* 2.69* 2.43*  HGB 7.1*  --  7.5*  --  6.8*  HCT 22.5*  --  23.8*  --  21.5*  MCV 86.5  --  87.2  --  88.5  MCH 27.3  --  27.5  --  28.0  MCHC 31.6  --  31.5  --  31.6  RDW 16.4*  --  16.4*  --  16.0*  PLT 249  --  273  --  283   < > = values in this interval not displayed.    Cardiac EnzymesNo results for input(s): TROPONINI in the last 168 hours. No results for input(s): TROPIPOC in the last 168 hours.   BNPNo results for input(s): BNP, PROBNP in the last 168 hours.   DDimer No results for input(s): DDIMER in the last 168 hours.   Radiology    04/03/20 RENAL  Result Date: 02/01/2020 CLINICAL DATA:  80 year old female with hematuria. EXAM: RENAL / URINARY TRACT ULTRASOUND COMPLETE COMPARISON:  01/25/2020 CT prior studies FINDINGS: Right Kidney: Renal measurements: 8.9 x 3.7 x 5 cm = volume: 85 mL. A 1.9 cm cyst within the mid RIGHT kidney is again noted. No solid mass or hydronephrosis. Renal echogenicity is UPPER limits of normal. Cortical thinning is present. Left Kidney: Renal measurements: 9.6 x 4.8 x 5.4 cm = volume: 130 mL. A 1.3 cm UPPER pole cyst is present. No solid mass or hydronephrosis. Renal echogenicity is UPPER limits normal. Cortical thinning is  noted. Bladder: Appears normal for degree of bladder distention. Other: None. IMPRESSION: 1. Bilateral renal cortical thinning and UPPER limits of normal renal echogenicity which may be seen with medical renal disease. 2. No findings on this study to suggest a cause for this patient's hematuria. Clinical follow-up recommended. Electronically Signed   By: 03/26/2020 M.D.   On: 02/01/2020 19:30   Telemetry    01/31/20 NSR with no arrhythmias noted  - Personally Reviewed  ECG    No new tracing as of 01/31/20 - Personally Reviewed  Cardiac Studies   TEE 01/29/20:  FINDINGS:  No LV apical thrombus No LA appendage thrombus or LA mural thrombus.  Mild-moderate aortic valve regurgitation.  No mobile valvular lesions. Negative for PFO.   Patient Profile     79 y.o. female with hx of HTN, dizziness, PSVT.   Admitted woith acute ischemia to legs   S/p embolectomy  Assessment & Plan    1  Bilateral popliteal emboli: -Pt presented with bilateral leg pain found to have bilateral popliteal emboli>>now s/p embolectomy 01/25/20>>had recurrent leg pain with 4 compartment fasciotomies, performed on 6/4 per [facility]  TEE without source  Currently on heparin  Optimally should be continued on anticoagulation    2  Anemia     Hgb continues to remain low  She got blood    Note anemia panel   Got Fe intravenously, B12 and PRBC    No obvous loss  Check CBC later today  May need to pause  3. HTN: -BP is under fair control   I would not push further given hx of orthostatic hypotension, age, anemia    4  Jx SVT   Remote  No occurrence here  5  Psych  Pt more alert this AM  In chair   Daughter present    Dietrich Pates MD

## 2020-02-02 NOTE — Care Management Important Message (Signed)
Important Message  Patient Details  Name: Barbara Hill MRN: [id number] Date of Birth: [birth year]   Medicare Important Message Given:  Yes     Renie Ora 02/02/2020, 10:05 AM

## 2020-02-02 NOTE — Progress Notes (Signed)
Inpatient Rehabilitation Admissions Coordinator  Patient not yet at a level to pursue more intensive rehab at [facility] with insurance authorization.  I will follow up next week. Other rehab venues need to also be explored.  Ottie Glazier, RN, MSN Rehab Admissions Coordinator [phone] 02/02/2020 3:31 PM

## 2020-02-02 NOTE — Progress Notes (Signed)
CRITICAL VALUE ALERT  Critical Value: Hemoglobin 6.8  Date & Time Notied:  02/02/20 @ 0500  Provider Notified: Dr Cherylin Mylar  Orders Received/Actions taken: See new order

## 2020-02-02 NOTE — Progress Notes (Addendum)
Progress Note    02/02/2020 9:12 AM 4 Days Post-Op  Subjective:  Says her legs feel okay  Tm 99.4 now 99.3 HR 50's-70's NSR 110's-140's systolic 93% RA  Vitals:   02/02/20 0622 02/02/20 0814  BP: 118/66 (!) 137/59  Pulse: 60 61  Resp: 13 18  Temp: 98.5 F (36.9 C) 99.6 F (37.6 C)  SpO2: 94% 93%    Physical Exam: Cardiac:  regular Lungs:  Non labored Incisions:  Bilateral lower extremity incisions are clean and dry Extremities:  right DP/PT and left PT/peroneal doppler signals; motor in tact and able to wiggle toes bilaterally   CBC    Component Value Date/Time   WBC 9.2 02/02/2020 0349   RBC 2.43 (L) 02/02/2020 0349   HGB 6.8 (LL) 02/02/2020 0349   HGB 10.6 (L) 04/10/2019 1047   HCT 21.5 (L) 02/02/2020 0349   HCT 31.9 (L) 04/10/2019 1047   PLT 283 02/02/2020 0349   PLT 308 04/10/2019 1047   MCV 88.5 02/02/2020 0349   MCV 91 04/10/2019 1047   MCV 92 10/23/2013 0803   MCH 28.0 02/02/2020 0349   MCHC 31.6 02/02/2020 0349   RDW 16.0 (H) 02/02/2020 0349   RDW 11.3 (L) 04/10/2019 1047   RDW 14.0 10/23/2013 0803   LYMPHSABS 2.6 01/24/2020 2200   LYMPHSABS 2.4 10/23/2013 0803   MONOABS 0.7 01/24/2020 2200   MONOABS 0.7 10/23/2013 0803   EOSABS 0.3 01/24/2020 2200   EOSABS 0.3 10/23/2013 0803   BASOSABS 0.0 01/24/2020 2200   BASOSABS 0.1 10/23/2013 0803    BMET    Component Value Date/Time   NA 137 02/02/2020 0349   NA 139 04/10/2019 1047   NA 140 10/23/2013 0803   K 4.1 02/02/2020 0349   K 4.6 10/23/2013 0803   CL 103 02/02/2020 0349   CL 107 10/23/2013 0803   CO2 25 02/02/2020 0349   CO2 28 10/23/2013 0803   GLUCOSE 112 (H) 02/02/2020 0349   GLUCOSE 87 10/23/2013 0803   BUN 17 02/02/2020 0349   BUN 19 04/10/2019 1047   BUN 6 (L) 10/23/2013 0803   CREATININE 0.94 02/02/2020 0349   CREATININE 0.66 07/24/2016 1125   CALCIUM 8.7 (L) 02/02/2020 0349   CALCIUM 8.8 10/23/2013 0803   GFRNONAA 58 (L) 02/02/2020 0349   GFRNONAA >60 10/23/2013 0803     GFRAA >60 02/02/2020 0349   GFRAA >60 10/23/2013 0803    INR    Component Value Date/Time   INR 1.3 (H) 01/29/2020 0835   INR 1.1 10/22/2013 0125     Intake/Output Summary (Last 24 hours) at 02/02/2020 0912 Last data filed at 02/02/2020 0850 Gross per 24 hour  Intake 555 ml  Output 575 ml  Net -20 ml     Assessment:  79 y.o. female is s/p:  BLE thrombectomies 9 Days Post Op and  and subsequent fasciotomies  8 Days Post-Op  Plan: -pt with right DP/PT and left PT/peroneal doppler signals. -acute blood loss anemia and receiving PRBC this am for hgb of 6.8.  She does have some swelling in the left leg, but do not feel this is the source of her anemia.  Hemoccult ordered yesterday -continue increasing mobilization -pt alert to place, year and president -DVT prophylaxis:  heparin   Doreatha Massed, PA-C [facility] [phone] 02/02/2020 9:12 AM  I have examined the patient, reviewed and agree with above.  Discussed with the patient's daughter present.  Still with posterior calf pain.  Moderate  swelling of her legs and with expected amount of bruising.  On continue to mobilize .  Curt Jews, MD 02/02/2020 2:20 PM

## 2020-02-02 NOTE — Progress Notes (Signed)
ANTICOAGULATION CONSULT NOTE - Follow Up Consult  Pharmacy Consult for Heparin Indication: s/p BL popliteal embolectomy  Allergies  Allergen Reactions   Dobutamine Other (See Comments)    Heart beating hard with CP, neck pain, and weakness   Epinephrine Other (See Comments)    Fast heart beat   Darvocet [Propoxyphene N-Acetaminophen] Nausea And Vomiting   Excedrin Extra Strength [Aspirin-Acetaminophen-Caffeine] Nausea And Vomiting   Aspirin    Clarithromycin Other (See Comments)    Yeast infection   Doxycycline Other (See Comments)    Makes stomach hurt   Penicillins Other (See Comments)    Yeast infection   Prednisone Other (See Comments)    Shakes    Propoxyphene Nausea And Vomiting    Patient Measurements: Height: 5\' 3"  (160 cm) Weight: 50.2 kg (110 lb 10.7 oz) IBW/kg (Calculated) : 52.4 Heparin Dosing Weight: 50.2 kg  Vital Signs: Temp: 99.3 F (37.4 C) (06/11 0850) Temp Source: Oral (06/11 0850) BP: 142/56 (06/11 0850) Pulse Rate: 64 (06/11 0850)  Labs: Recent Labs    01/31/20 0012 01/31/20 0731 02/01/20 0318 02/01/20 0318 02/01/20 1113 02/01/20 2251 02/02/20 0349  HGB 6.4*   < > 7.1*   < > 7.5*  --  6.8*  HCT 20.3*   < > 22.5*  --  23.8*  --  21.5*  PLT 252  --  249  --  273  --  283  HEPARINUNFRC 0.44  --  <0.10*   < > 0.19* 0.32 0.30  CREATININE 1.06*  --  0.94  --   --   --  0.94   < > = values in this interval not displayed.    Estimated Creatinine Clearance: 39.1 mL/min (by C-G formula based on SCr of 0.94 mg/dL).  Assessment: 79 yr old female on heparin for bilateral cardiogenic emboli, S/P bilateral embolectomy 6/4 and 4 compartment fasciotomy. No AC PTA. She is noted with hematoma of L calf and hematuria. FOBT and iron studies ordered.  Iron studies low > Feraheme IV x 1 given 6/10 pm and Vitamin B-12 begun.     Heparin level low therapeutic (0.30) this am on 1000 units/hr.  Hgb down to 6.8 and 1 unit PRBCs transfused. VVS watching  L calf hematoma, not felt to be source of anemia.  Hematuria thought due to Enterococcal UTI, on Amoxicillin.   Continues on Plavix as PTA.    Goal of Therapy:  Heparin level = 0.3-0.5 Monitor platelets by anticoagulation protocol: Yes   Plan:  Continue heparin drip at 1000 units/hr Continue to target low therapeutic heparin levels. Daily heparin level and CBC. Monitor for s/sx bleeding. F/U plans for oral anticoagulation prior to discharge. Currently on Plavix 75 mg daily as prior to admission.  8/4, Dennie Fetters Phone: [place] 02/02/2020 11:54 AM

## 2020-02-02 NOTE — Progress Notes (Signed)
Physical Therapy Treatment Patient Details Name: Barbara Hill MRN: [id number] DOB: 23-Feb-1941 Today's Date: 02/02/2020    History of Present Illness Patient is a 79 y/o female who presented with acute ischemic lower extremities now s/p bilateral popliteal exploration and embolectomy 01/25/20. 24 hours later developed compartment syndrome and went for bilateral 4 compartment fasciotomy 01/26/20.  Has some post op confusion.  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    PT Comments    Patient progressing very slowly towards PT goals. Noted to have worsening swelling and discoloration in LLE with increased pain. Requires Max A for bed mobility with increased time due to anxiety and pain. Tolerated lateral transfer to chair with Max A of 2 with little scoots using chuck pad for assist. Pt continues to be confused. Daughter present in beginning of session. Will continue to follow and progress as tolerated.    Follow Up Recommendations  CIR;Supervision/Assistance - 24 hour     Equipment Recommendations  Rolling walker with 5" wheels    Recommendations for Other Services       Precautions / Restrictions Precautions Precautions: Fall Precaution Comments: watch BP Restrictions Weight Bearing Restrictions: No    Mobility  Bed Mobility Overal bed mobility: Needs Assistance Bed Mobility: Supine to Sit     Supine to sit: Mod assist;HOB elevated     General bed mobility comments: Use of pad under legs/bottom to assist with bringing LEs to EOB. Able to move LEs minimally on own.  Transfers Overall transfer level: Needs assistance Equipment used: None Transfers: Lateral/Scoot Transfers          Lateral/Scoot Transfers: Max assist;+2 physical assistance General transfer comment: Assist of 2 to laterally scoot to drop arm recliner with cues for technique; pt minimally assisting. Limited mainly by pain in BLEs, LLE  mostly.  Ambulation/Gait             General Gait Details: Unable   Stairs             Wheelchair Mobility    Modified Rankin (Stroke Patients Only)       Balance Overall balance assessment: Needs assistance Sitting-balance support: Feet supported;Feet unsupported;Single extremity supported Sitting balance-Leahy Scale: Fair Sitting balance - Comments: Close min guard for balance.       Standing balance comment: Did not attempt due to pain.                            Cognition Arousal/Alertness: Awake/alert Behavior During Therapy: Anxious Overall Cognitive Status: Impaired/Different from baseline Area of Impairment: Attention;Following commands;Problem solving;Memory;Orientation                   Current Attention Level: Sustained Memory: Decreased short-term memory Following Commands: Follows one step commands with increased time     Problem Solving: Slow processing;Difficulty sequencing;Requires verbal cues;Requires tactile cues General Comments: pleasantly confused. repeating self.      Exercises      General Comments General comments (skin integrity, edema, etc.): No weeping from incisions in BLEs. Swelling present in LLE>RLE and purplish color in foot. Rn aware.      Pertinent Vitals/Pain Pain Assessment: Faces Faces Pain Scale: Hurts whole lot Pain Location: LLE Pain Descriptors / Indicators: Aching;Grimacing;Discomfort;Sore;Tender Pain Intervention(s): Monitored during session;Patient requesting pain meds-RN notified;Repositioned;Limited activity within patient's tolerance    Home Living  Prior Function            PT Goals (current goals can now be found in the care plan section) Progress towards PT goals: Progressing toward goals    Frequency    Min 3X/week      PT Plan Current plan remains appropriate    Co-evaluation              AM-PAC PT "6 Clicks" Mobility    Outcome Measure  Help needed turning from your back to your side while in a flat bed without using bedrails?: A Lot Help needed moving from lying on your back to sitting on the side of a flat bed without using bedrails?: A Lot Help needed moving to and from a bed to a chair (including a wheelchair)?: Total Help needed standing up from a chair using your arms (e.g., wheelchair or bedside chair)?: Total Help needed to walk in hospital room?: Total Help needed climbing 3-5 steps with a railing? : Total 6 Click Score: 8    End of Session Equipment Utilized During Treatment: Gait belt Activity Tolerance: Patient limited by pain Patient left: in chair;with call bell/phone within reach;with chair alarm set;with nursing/sitter in room Nurse Communication: Mobility status PT Visit Diagnosis: Other abnormalities of gait and mobility (R26.89);Muscle weakness (generalized) (M62.81);Difficulty in walking, not elsewhere classified (R26.2);Pain Pain - Right/Left: Left Pain - part of body: Leg     Time: 2703-5009 PT Time Calculation (min) (ACUTE ONLY): 24 min  Charges:  $Therapeutic Activity: 23-37 mins                     Marisa Severin, PT, DPT Acute Rehabilitation Services Pager [phone] Office [phone]       Marguarite Arbour A Sabra Heck 02/02/2020, 1:12 PM

## 2020-02-03 DIAGNOSIS — D631 Anemia in chronic kidney disease: Secondary | ICD-10-CM

## 2020-02-03 DIAGNOSIS — E876 Hypokalemia: Secondary | ICD-10-CM

## 2020-02-03 DIAGNOSIS — I951 Orthostatic hypotension: Secondary | ICD-10-CM

## 2020-02-03 DIAGNOSIS — D62 Acute posthemorrhagic anemia: Secondary | ICD-10-CM

## 2020-02-03 LAB — BPAM RBC
Blood Product Expiration Date: 202107152359
ISSUE DATE / TIME: 202106110600
Unit Type and Rh: 8400

## 2020-02-03 LAB — BASIC METABOLIC PANEL
Anion gap: 10 (ref 5–15)
BUN: 12 mg/dL (ref 8–23)
CO2: 27 mmol/L (ref 22–32)
Calcium: 8.8 mg/dL — ABNORMAL LOW (ref 8.9–10.3)
Chloride: 101 mmol/L (ref 98–111)
Creatinine, Ser: 0.96 mg/dL (ref 0.44–1.00)
GFR calc Af Amer: >60 mL/min (ref >60)
GFR calc non Af Amer: 57 mL/min — ABNORMAL LOW (ref >60)
Glucose, Bld: 100 mg/dL — ABNORMAL HIGH (ref 70–99)
Potassium: 4.4 mmol/L (ref 3.5–5.1)
Sodium: 138 mmol/L (ref 135–145)

## 2020-02-03 LAB — TYPE AND SCREEN
ABO/RH(D): AB POS
Antibody Screen: NEGATIVE
Unit division: 0

## 2020-02-03 LAB — CBC
HCT: 25.8 % — ABNORMAL LOW (ref 36.0–46.0)
HCT: 26.3 % — ABNORMAL LOW (ref 36.0–46.0)
Hemoglobin: 8.1 g/dL — ABNORMAL LOW (ref 12.0–15.0)
Hemoglobin: 8.2 g/dL — ABNORMAL LOW (ref 12.0–15.0)
MCH: 27.9 pg (ref 26.0–34.0)
MCH: 27.8 pg (ref 26.0–34.0)
MCHC: 31.4 g/dL (ref 30.0–36.0)
MCHC: 31.2 g/dL (ref 30.0–36.0)
MCV: 89 fL (ref 80.0–100.0)
MCV: 89.2 fL (ref 80.0–100.0)
Platelets: 324 10*3/uL (ref 150–400)
Platelets: 346 10*3/uL (ref 150–400)
RBC: 2.9 MIL/uL — ABNORMAL LOW (ref 3.87–5.11)
RBC: 2.95 MIL/uL — ABNORMAL LOW (ref 3.87–5.11)
RDW: 15.6 % — ABNORMAL HIGH (ref 11.5–15.5)
RDW: 15.6 % — ABNORMAL HIGH (ref 11.5–15.5)
WBC: 7.6 10*3/uL (ref 4.0–10.5)
WBC: 7.5 10*3/uL (ref 4.0–10.5)
nRBC: 0.4 % — ABNORMAL HIGH (ref 0.0–0.2)
nRBC: 0.7 % — ABNORMAL HIGH (ref 0.0–0.2)

## 2020-02-03 LAB — HEPARIN LEVEL (UNFRACTIONATED): Heparin Unfractionated: 0.37 IU/mL (ref 0.30–0.70)

## 2020-02-03 LAB — OCCULT BLOOD X 1 CARD TO LAB, STOOL: Fecal Occult Bld: NEGATIVE

## 2020-02-03 NOTE — Progress Notes (Signed)
PROGRESS NOTE    Barbara Hill  VBT:[id number] DOB: 04-May-1941 DOA: 01/24/2020 PCP: Richmond Campbell., PA-C    Brief Narrative:  79 y.o. 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 Her right leg pain started acutely and was severe. She had also had long-term Holter monitoring performed in March of this year which showed no significant arrhythmias.  She does admit to still smoking cigarettes, but reports that she will quit as she will related with the pain that she had in her right leg that brought her to the hospital. Upon admission into the emergency department patient was noted to be afebrile with a pressure elevated up to 217/84, heart rates intermittently fluctuating from 60-130, and all other vital signs maintained.  Labs were significant for hemoglobin 10, BUN 18, and creatinine 1.21.  CT scan of the abdomen pelvis revealed bilateral popliteal embolus.  Patient was taken immediately to the operating grading room by vascular surgery for bilateral popliteal embolectomy.  Assessment & Plan:   Principal Problem:   Ischemic leg Active Problems:   Paroxysmal SVT (supraventricular tachycardia) (HCC)   Tobacco use disorder   COPD (chronic obstructive pulmonary disease) (HCC)   Normocytic anemia   History of CVA (cerebrovascular accident)   Anxiety   CKD (chronic kidney disease), stage III  Acute bilateral ischemic legs s/p bilateral popliteal embolectomy on 01/25/20 with subsequent 4 compartment fasciotomies on 01/26/20 CT abd/pelvis showed bilateral popliteal embolus Vascular Surgery consulted and patient underwent B popliteal exploration and embolectomy on 6/3 with subsequent 4 compartment fasciotomies, performed on 6/4 Underwent TEE 6/7 with no embolic source identified Pt is continued on heparin gtt  per Vascular Surgery  Paroxysmal SVT Stable Cont with home flecainide 50mg  twice daily  Hypomagnesemia Replace prn  COPD/tobacco use Patient without signs of acute exacerbation of her COPD.   Reportedly declined nicotine patch on presentation Cont with Albuterol nebs as needed shortness of breath/wheezing Advised to quit  Memory deficit with acute toxicmetabolic encephalopathy secondary to enterococcus UTI Patient noted issues with memory and possibly confused at times Urine cx pos for enterococcus species Was on empiric rocephin, switched to amoxicillin. Will also give dose of diflucan given hx of yeast infections with abx  Acute blood loss anemia likely secondary to post op/recent surgery/hematoma Vs unlikely hematuria Unknown etiology Hemoglobin dropped to 6.4 on 01/31/20, s/p 2U of PRBC, last transfused on 02/02/20 Anemia panel ordered by cardiology despite transfusion still showed iron def, iron 15, sats 6%, Vit B12 223 FOBT pending Large blood on UA. Have discussed with Urology on call. Pt is currently treated for enterococcus UTI which is most likely the culprit for hematuria, per Urology. Urology recommendation to cont to treat for enterococcus UTI and monitor for clot formation resulting in bladder outlet obstruction. Ultimately when discharged, recommendation to follow up with pt's primary Urologist, Dr. 04/03/20 for hematuria work up. Renal USS negative for any bleeding source Gave 1 dose of feraheme, started Vit B12 on 02/01/20 Daily CBC  History of CVA  Patient with previous history of stroke following procedure in 2017  Essential hypertension BP stable  Home medications include losartan 25 mg daily and metoprolol 25 mg nightly Continued on metoprolol  Chronic kidney disease stage IIIb Patient presents with creatinine of 1.21 which appears within her baseline of 1.2-1.3. Daily BMP  Anxiety Continue Xanax  0.5 mg 3 times daily as needed and fluoxetine 20 mg  twice daily  GERD Home meds include omeprazole 20 mg daily    DVT prophylaxis: Heparin gtt Code Status: Full Family Communication: Discussed extensively with daughter on 02/02/20   Status is: Inpatient  Remains inpatient appropriate because:Hemodynamically unstable, Ongoing diagnostic testing needed not appropriate for outpatient work up, IV treatments appropriate due to intensity of illness or inability to take PO and Inpatient level of care appropriate due to severity of illness   Dispo: The patient is from: Home              Anticipated d/c is to: CIR              Anticipated d/c date is: 3 days, when cleared by Vascular, cardiology              Patient currently is not medically stable to d/c.   Consultants:   Vascular Surgery  Cardiology  Discussed with urology  Procedures:  B popliteal exploration and embolectomy on 6/3 Emergent 4 compartment fasciotomy 6/4  Antimicrobials: Anti-infectives (From admission, onward)   Start     Dose/Rate Route Frequency Ordered Stop   01/30/20 1630  amoxicillin (AMOXIL) capsule 500 mg     Discontinue     500 mg Oral Every 8 hours 01/30/20 1619     01/30/20 1630  fluconazole (DIFLUCAN) tablet 150 mg        150 mg Oral  Once 01/30/20 1619 01/30/20 1733   01/28/20 1530  cefTRIAXone (ROCEPHIN) 1 g in sodium chloride 0.9 % 100 mL IVPB  Status:  Discontinued       Note to Pharmacy: Allergy list reviewed Per checklist, should be OK for cephalosporin   1 g 200 mL/hr over 30 Minutes Intravenous Every 24 hours 01/28/20 1358 01/30/20 1619   01/26/20 0918  ceFAZolin (ANCEF) 2-4 GM/100ML-% IVPB       Note to Pharmacy: Evern Bio   : cabinet override      01/26/20 0918 01/26/20 2129      Subjective: Pt still c/o LLE pain, denies any other new complaints    Objective: Vitals:   02/03/20 0013 02/03/20 0314 02/03/20 0744 02/03/20 1500  BP: 133/65 96/63 139/74 133/63  Pulse: 60 61 60 76  Resp: 14 17 16 15   Temp: 98.3 F (36.8 C)  98 F (36.7 C) 98.4 F (36.9 C) 98.4 F (36.9 C)  TempSrc: Oral Oral Oral Oral  SpO2: 93% 94% 90% 90%  Weight:      Height:        Intake/Output Summary (Last 24 hours) at 02/03/2020 1708 Last data filed at 02/03/2020 1400 Gross per 24 hour  Intake 828.04 ml  Output 350 ml  Net 478.04 ml   Filed Weights   01/24/20 2115 01/29/20 1046 02/02/20 0314  Weight: 48.5 kg 48.1 kg 50.2 kg    Examination:  General: NAD, frail, oriented, intermittent confusion  Cardiovascular: S1, S2 present  Respiratory: CTAB  Abdomen: Soft, nontender, nondistended, bowel sounds present  Musculoskeletal: Noted 4 incision in b/l thigh with staples, c/d/i, LLE 2+ edema noted   Skin: As mentioned above  Psychiatry: Normal mood    Data Reviewed: I have personally reviewed following labs and imaging studies  CBC: Recent Labs  Lab 01/31/20 0012 01/31/20 0731 02/01/20 0318 02/01/20 1113 02/02/20 0349 02/02/20 1202 02/03/20 0417  WBC 13.1*  --  11.7* 10.7* 9.2  --  7.6  HGB 6.4*   < >  7.1* 7.5* 6.8* 8.8* 8.1*  HCT 20.3*   < > 22.5* 23.8* 21.5* 28.2* 25.8*  MCV 90.2  --  86.5 87.2 88.5  --  89.0  PLT 252  --  249 273 283  --  324   < > = values in this interval not displayed.   Basic Metabolic Panel: Recent Labs  Lab 01/28/20 0603 01/28/20 0603 01/29/20 0316 01/29/20 0316 01/30/20 0349 01/31/20 0012 02/01/20 0318 02/02/20 0349 02/03/20 0417  NA 137   < > 138   < > 137 139 136 137 138  K 3.5   < > 3.7   < > 3.3* 4.6 3.4* 4.1 4.4  CL 102   < > 101   < > 102 102 104 103 101  CO2 27   < > 28   < > 25 25 25 25 27   GLUCOSE 105*   < > 101*   < > 99 128* 124* 112* 100*  BUN 16   < > 17   < > 18 18 19 17 12   CREATININE 1.07*   < > 1.03*   < > 0.96 1.06* 0.94 0.94 0.96  CALCIUM 8.7*   < > 8.7*   < > 8.7* 8.8* 8.6* 8.7* 8.8*  MG 1.3*  --  2.3  --   --  1.4* 1.8  --   --    < > = values in this interval not displayed.   GFR: Estimated Creatinine Clearance: 38.3 mL/min (by C-G formula  based on SCr of 0.96 mg/dL). Liver Function Tests: Recent Labs  Lab 01/28/20 0603 01/29/20 0316 01/30/20 0349 01/31/20 0012  AST 29 26 19 17   ALT 11 11 11 12   ALKPHOS 49 50 45 46  BILITOT 0.5 0.4 0.6 0.7  PROT 6.1* 5.9* 5.7* 5.6*  ALBUMIN 2.5* 2.5* 2.4* 2.4*   No results for input(s): LIPASE, AMYLASE in the last 168 hours. No results for input(s): AMMONIA in the last 168 hours. Coagulation Profile: Recent Labs  Lab 01/29/20 0835  INR 1.3*   Cardiac Enzymes: No results for input(s): CKTOTAL, CKMB, CKMBINDEX, TROPONINI in the last 168 hours. BNP (last 3 results) No results for input(s): PROBNP in the last 8760 hours. HbA1C: No results for input(s): HGBA1C in the last 72 hours. CBG: No results for input(s): GLUCAP in the last 168 hours. Lipid Profile: No results for input(s): CHOL, HDL, LDLCALC, TRIG, CHOLHDL, LDLDIRECT in the last 72 hours. Thyroid Function Tests: No results for input(s): TSH, T4TOTAL, FREET4, T3FREE, THYROIDAB in the last 72 hours. Anemia Panel: Recent Labs    02/01/20 1113 02/01/20 1124  VITAMINB12 223  --   FOLATE 6.4  --   FERRITIN 97  --   TIBC 246*  --   IRON 15*  --   RETICCTPCT  --  3.2*   Sepsis Labs: No results for input(s): PROCALCITON, LATICACIDVEN in the last 168 hours.  Recent Results (from the past 240 hour(s))  SARS Coronavirus 2 by RT PCR (hospital order, performed in [facility] hospital lab) Nasopharyngeal Nasopharyngeal Swab     Status: None   Collection Time: 01/25/20  4:23 AM   Specimen: Nasopharyngeal Swab  Result Value Ref Range Status   SARS Coronavirus 2 NEGATIVE NEGATIVE Final    Comment: (NOTE) SARS-CoV-2 target nucleic acids are NOT DETECTED. The SARS-CoV-2 RNA is generally detectable in upper and lower respiratory specimens during the acute phase of infection. The lowest concentration of SARS-CoV-2 viral copies this assay can  detect is 250 copies / mL. A negative result does not preclude SARS-CoV-2  infection and should not be used as the sole basis for treatment or other patient management decisions.  A negative result may occur with improper specimen collection / handling, submission of specimen other than nasopharyngeal swab, presence of viral mutation(s) within the areas targeted by this assay, and inadequate number of viral copies (<250 copies / mL). A negative result must be combined with clinical observations, patient history, and epidemiological information. Fact Sheet for Patients:   BoilerBrush.com.cy Fact Sheet for Healthcare Providers: https://pope.com/ This test is not yet approved or cleared  by the [place] FDA and has been authorized for detection and/or diagnosis of SARS-CoV-2 by FDA under an Emergency Use Authorization (EUA).  This EUA will remain in effect (meaning this test can be used) for the duration of the COVID-19 declaration under Section 564(b)(1) of the Act, 21 U.S.C. section 360bbb-3(b)(1), unless the authorization is terminated or revoked sooner. Performed at [facility], [address], [place], [place] [address]   Culture, Urine     Status: Abnormal   Collection Time: 01/28/20  1:57 PM   Specimen: Urine, Random  Result Value Ref Range Status   Specimen Description URINE, RANDOM  Final   Special Requests   Final    NONE Performed at [facility], [address], [place], [place] [address]    Culture 20,000 COLONIES/mL ENTEROCOCCUS FAECALIS (A)  Final   Report Status 01/30/2020 FINAL  Final   Organism ID, Bacteria ENTEROCOCCUS FAECALIS (A)  Final      Susceptibility   Enterococcus faecalis - MIC*    AMPICILLIN <=2 SENSITIVE Sensitive     NITROFURANTOIN <=16 SENSITIVE Sensitive     VANCOMYCIN 1 SENSITIVE Sensitive     * 20,000 COLONIES/mL ENTEROCOCCUS FAECALIS     Radiology Studies: US RENAL  Result Date: 02/01/2020 CLINICAL DATA:  79 year old female with hematuria. EXAM:  RENAL / URINARY TRACT ULTRASOUND COMPLETE COMPARISON:  01/25/2020 CT prior studies FINDINGS: Right Kidney: Renal measurements: 8.9 x 3.7 x 5 cm = volume: 85 mL. A 1.9 cm cyst within the mid RIGHT kidney is again noted. No solid mass or hydronephrosis. Renal echogenicity is UPPER limits of normal. Cortical thinning is present. Left Kidney: Renal measurements: 9.6 x 4.8 x 5.4 cm = volume: 130 mL. A 1.3 cm UPPER pole cyst is present. No solid mass or hydronephrosis. Renal echogenicity is UPPER limits normal. Cortical thinning is noted. Bladder: Appears normal for degree of bladder distention. Other: None. IMPRESSION: 1. Bilateral renal cortical thinning and UPPER limits of normal renal echogenicity which may be seen with medical renal disease. 2. No findings on this study to suggest a cause for this patient's hematuria. Clinical follow-up recommended. Electronically Signed   By: Harmon Pier M.D.   On: 02/01/2020 19:30    Scheduled Meds: . amoxicillin  500 mg Oral Q8H  . Chlorhexidine Gluconate Cloth  6 each Topical Daily  . clopidogrel  75 mg Oral Daily  . flecainide  50 mg Oral BID  . FLUoxetine  20 mg Oral BID  . metoprolol succinate  25 mg Oral QHS  . pantoprazole  40 mg Oral Daily  . sodium chloride flush  3 mL Intravenous Q12H  . vitamin B-12  1,000 mcg Oral Daily   Continuous Infusions: . heparin 1,000 Units/hr (02/03/20 0600)     LOS: 9 days   Briant Cedar, MD [facility] Pager On Amion  If 7PM-7AM, please contact night-coverage 02/03/2020, 5:08 PM

## 2020-02-03 NOTE — Progress Notes (Signed)
.     Progress Note  Patient Name: Barbara Hill Date of Encounter: 02/03/2020  Primary Cardiologist: Dietrich Pates, MD   Subjective   Patient alert this am   Talking   Asking appropriate ques  Like how I see her in clinic  Denies CP  No SOB   Inpatient Medications    Scheduled Meds: . amoxicillin  500 mg Oral Q8H  . Chlorhexidine Gluconate Cloth  6 each Topical Daily  . clopidogrel  75 mg Oral Daily  . flecainide  50 mg Oral BID  . FLUoxetine  20 mg Oral BID  . metoprolol succinate  25 mg Oral QHS  . pantoprazole  40 mg Oral Daily  . sodium chloride flush  3 mL Intravenous Q12H  . vitamin B-12  1,000 mcg Oral Daily   Continuous Infusions: . heparin 1,000 Units/hr (02/03/20 0600)     Vital Signs    Vitals:   02/02/20 2054 02/03/20 0013 02/03/20 0314 02/03/20 0744  BP: 133/61 133/65 96/63 139/74  Pulse: 61 60 61 60  Resp: 16 14 17 16   Temp: 99.1 F (37.3 C) 98.3 F (36.8 C) 98 F (36.7 C) 98.4 F (36.9 C)  TempSrc: Oral Oral Oral Oral  SpO2: 95% 93% 94% 90%  Weight:      Height:        Intake/Output Summary (Last 24 hours) at 02/03/2020 1158 Last data filed at 02/03/2020 1049 Gross per 24 hour  Intake 828.04 ml  Output 600 ml  Net 228.04 ml   Filed Weights   01/24/20 2115 01/29/20 1046 02/02/20 0314  Weight: 48.5 kg 48.1 kg 50.2 kg    Physical Exam   General: Frail, elderly, NAD Skin: Incisions dry Lungs: REl clear   Cardiovascular: RRR with S1 S2. No murmurs.  Abdomen: Soft, non-tender, non-distended Extremities:LLE with some swelling  RLE [facility]     Labs    Chemistry Recent Labs  Lab 01/29/20 0316 01/29/20 0316 01/30/20 0349 01/30/20 0349 01/31/20 0012 01/31/20 0012 02/01/20 0318 02/02/20 0349 02/03/20 0417  NA 138   < > 137   < > 139   < > 136 137 138  K 3.7   < > 3.3*   < > 4.6   < > 3.4* 4.1 4.4  CL 101   < > 102   < > 102   < > 104 103 101  CO2 28   < > 25   < > 25   < > 25 25 27   GLUCOSE 101*   < > 99   < > 128*   < > 124* 112*  100*  BUN 17   < > 18   < > 18   < > 19 17 12   CREATININE 1.03*   < > 0.96   < > 1.06*   < > 0.94 0.94 0.96  CALCIUM 8.7*   < > 8.7*   < > 8.8*   < > 8.6* 8.7* 8.8*  PROT 5.9*  --  5.7*  --  5.6*  --   --   --   --   ALBUMIN 2.5*  --  2.4*  --  2.4*  --   --   --   --   AST 26  --  19  --  17  --   --   --   --   ALT 11  --  11  --  12  --   --   --   --  ALKPHOS 50  --  45  --  46  --   --   --   --   BILITOT 0.4  --  0.6  --  0.7  --   --   --   --   GFRNONAA 52*   < > 57*   < > 50*   < > 58* 58* 57*  GFRAA >60   < > >60   < > 58*   < > >60 >60 >60  ANIONGAP 9   < > 10   < > 12   < > 7 9 10    < > = values in this interval not displayed.     Hematology Recent Labs  Lab 02/01/20 1113 02/01/20 1113 02/01/20 1124 02/02/20 0349 02/02/20 1202 02/03/20 0417  WBC 10.7*  --   --  9.2  --  7.6  RBC 2.73*  --  2.69* 2.43*  --  2.90*  HGB 7.5*   < >  --  6.8* 8.8* 8.1*  HCT 23.8*   < >  --  21.5* 28.2* 25.8*  MCV 87.2  --   --  88.5  --  89.0  MCH 27.5  --   --  28.0  --  27.9  MCHC 31.5  --   --  31.6  --  31.4  RDW 16.4*  --   --  16.0*  --  15.6*  PLT 273  --   --  283  --  324   < > = values in this interval not displayed.    Cardiac EnzymesNo results for input(s): TROPONINI in the last 168 hours. No results for input(s): TROPIPOC in the last 168 hours.   BNPNo results for input(s): BNP, PROBNP in the last 168 hours.   DDimer No results for input(s): DDIMER in the last 168 hours.   Radiology    04/04/20 RENAL  Result Date: 02/01/2020 CLINICAL DATA:  79 year old female with hematuria. EXAM: RENAL / URINARY TRACT ULTRASOUND COMPLETE COMPARISON:  01/25/2020 CT prior studies FINDINGS: Right Kidney: Renal measurements: 8.9 x 3.7 x 5 cm = volume: 85 mL. A 1.9 cm cyst within the mid RIGHT kidney is again noted. No solid mass or hydronephrosis. Renal echogenicity is UPPER limits of normal. Cortical thinning is present. Left Kidney: Renal measurements: 9.6 x 4.8 x 5.4 cm = volume: 130 mL.  A 1.3 cm UPPER pole cyst is present. No solid mass or hydronephrosis. Renal echogenicity is UPPER limits normal. Cortical thinning is noted. Bladder: Appears normal for degree of bladder distention. Other: None. IMPRESSION: 1. Bilateral renal cortical thinning and UPPER limits of normal renal echogenicity which may be seen with medical renal disease. 2. No findings on this study to suggest a cause for this patient's hematuria. Clinical follow-up recommended. Electronically Signed   By: 03/26/2020 M.D.   On: 02/01/2020 19:30   Telemetry    01/31/20 NSR with no arrhythmias noted  - Personally Reviewed  ECG    No new tracing as of 01/31/20 - Personally Reviewed  Cardiac Studies   TEE 01/29/20:  FINDINGS:  No LV apical thrombus No LA appendage thrombus or LA mural thrombus.  Mild-moderate aortic valve regurgitation.  No mobile valvular lesions. Negative for PFO.   Patient Profile     79 y.o. female with hx of HTN, dizziness, PSVT.   Admitted woith acute ischemia to legs   S/p embolectomy  Assessment & Plan    1  Bilateral popliteal  emboli: -Pt presented with bilateral leg pain found to have bilateral popliteal emboli>>now s/p embolectomy 01/25/20>>had recurrent leg pain with 4 compartment fasciotomies, performed on 6/4 per VVS  TEE without source  Currently on heparin  Optimally should be continued on anticoagulation  Has remained no heparin but Hgb drops    2  Anemia    The pt's Hgb at 8.1  She continues to need transfusions  She has also been given Fe and B 12   No clear sources for drop   Will repeat CBC later today and in am       3. HTN: -BP is under  control    4  Jx SVT   Remote  No occurrence here  5  Psych  Pt is much improved in MS from rest of week  Alert  Knows where she is   Remembers me   Much improved     Dorris Carnes MD

## 2020-02-03 NOTE — Progress Notes (Signed)
ANTICOAGULATION CONSULT NOTE - Follow Up Consult  Pharmacy Consult for Heparin Indication: s/p BL popliteal embolectomy  Allergies  Allergen Reactions  . Dobutamine Other (See Comments)    Heart beating hard with CP, neck pain, and weakness  . Epinephrine Other (See Comments)    Fast heart beat  . Darvocet [Propoxyphene N-Acetaminophen] Nausea And Vomiting  . Excedrin Extra Strength [Aspirin-Acetaminophen-Caffeine] Nausea And Vomiting  . Aspirin   . Clarithromycin Other (See Comments)    Yeast infection  . Doxycycline Other (See Comments)    Makes stomach hurt  . Penicillins Other (See Comments)    Yeast infection  . Prednisone Other (See Comments)    Shakes   . Propoxyphene Nausea And Vomiting    Patient Measurements: Height: 5\' 3"  (160 cm) Weight: 50.2 kg (110 lb 10.7 oz) IBW/kg (Calculated) : 52.4 Heparin Dosing Weight: 50.2 kg  Vital Signs: Temp: 98.4 F (36.9 C) (06/12 0744) Temp Source: Oral (06/12 0744) BP: 139/74 (06/12 0744) Pulse Rate: 60 (06/12 0744)  Labs: Recent Labs    02/01/20 0318 02/01/20 0318 02/01/20 1113 02/01/20 1113 02/01/20 2251 02/02/20 0349 02/02/20 0349 02/02/20 1202 02/03/20 0417  HGB 7.1*   < > 7.5*   < >  --  6.8*   < > 8.8* 8.1*  HCT 22.5*   < > 23.8*   < >  --  21.5*  --  28.2* 25.8*  PLT 249   < > 273  --   --  283  --   --  324  HEPARINUNFRC <0.10*   < > 0.19*   < > 0.32 0.30  --   --  0.37  CREATININE 0.94  --   --   --   --  0.94  --   --  0.96   < > = values in this interval not displayed.    Estimated Creatinine Clearance: 38.3 mL/min (by C-G formula based on SCr of 0.96 mg/dL).  Assessment: 79 yr old female on heparin for bilateral cardiogenic emboli, S/P bilateral embolectomy 6/4 and 4 compartment fasciotomy. No AC PTA. She is noted with hematoma of L calf and hematuria. FOBT and iron studies ordered.  Iron studies low > Feraheme IV x 1 given 6/10 pm and Vitamin B-12 begun.    Heparin level therapeutic (0.37) this  am on 1000 units/hr.  Hgb up s/p PRBCs. VVS watching L calf hematoma, not felt to be source of anemia.  Hematuria thought due to Enterococcal UTI, on Amoxicillin.   Continues on Plavix as PTA.    Goal of Therapy:  Heparin level = 0.3-0.5 Monitor platelets by anticoagulation protocol: Yes   Plan:  Continue heparin drip at 1000 units/hr Continue to target low therapeutic heparin levels. Daily heparin level and CBC. Monitor for s/sx bleeding. F/U plans for oral anticoagulation prior to discharge. Currently on Plavix 75 mg daily as prior to admission.  8/4, Reece Leader, BCCP Clinical Pharmacist  02/03/2020 8:25 AM   [facility] pharmacy phone numbers are listed on [email or web address]

## 2020-02-03 NOTE — Progress Notes (Addendum)
[facility]  Subjective  - Doing OK overall.   Objective 139/74 60 98.4 F (36.9 C) (Oral) 16 90%  Intake/Output Summary (Last 24 hours) at 02/03/2020 0842 Last data filed at 02/03/2020 0629 Gross per 24 hour  Intake 1263.04 ml  Output 500 ml  Net 763.04 ml    Left LE edema and decreased mobility with foot drop.  Minimal left toe and ankle motor.  Right LE good mobility and no edema. Right DP/PT and left PT/peroneal doppler signals Compartment incisions healing well.  Assessment/Planning: 79 y.o. female is s/p:  BLE thrombectomies 10 Days Post Op and  and subsequent fasciotomies  9Days Post-Op  HGB 8.1 5 units PRBC.  No active bleeding at compartment fasciotomy sites. Encourage mobility and alternating with with bed in trendelenburg feet elevated to help with left LE edema.  She may need AFO in the future for ambulation.  Encouraged active motor of toes and ankles.    Barbara Hill 02/03/2020 8:42 AM --  Laboratory Lab Results: Recent Labs    02/02/20 0349 02/02/20 0349 02/02/20 1202 02/03/20 0417  WBC 9.2  --   --  7.6  HGB 6.8*   < > 8.8* 8.1*  HCT 21.5*   < > 28.2* 25.8*  PLT 283  --   --  324   < > = values in this interval not displayed.   BMET Recent Labs    02/02/20 0349 02/03/20 0417  NA 137 138  K 4.1 4.4  CL 103 101  CO2 25 27  GLUCOSE 112* 100*  BUN 17 12  CREATININE 0.94 0.96  CALCIUM 8.7* 8.8*    COAG Lab Results  Component Value Date   INR 1.3 (H) 01/29/2020   INR 1.11 11/12/2016   INR 1.10 06/22/2016   No results found for: PTT  I have seen and evaluated the patient. I agree with the PA note as documented above.  79 year old female status post bilateral lower extremity thrombectomies and subsequent fasciotomies for suspected embolic event.  Remains on heparin this morning.  Responded to blood transfusion yesterday hemoglobin now 8.1 from 6.8.  Palpable right DP and very brisk dorsalis  pedis and posterior tibial signals in the left foot.  Left leg is a bit swollen and she has evidence of some hematoma in the compartment but able to wiggle her toes and states her pain is the same with no increasing pain since yesterday.  We will continue to monitor.  Continue to mobilize.  Cephus Shelling, MD [facility] Office: [phone]

## 2020-02-04 LAB — BASIC METABOLIC PANEL
Anion gap: 10 (ref 5–15)
BUN: 9 mg/dL (ref 8–23)
CO2: 27 mmol/L (ref 22–32)
Calcium: 8.6 mg/dL — ABNORMAL LOW (ref 8.9–10.3)
Chloride: 98 mmol/L (ref 98–111)
Creatinine, Ser: 0.97 mg/dL (ref 0.44–1.00)
GFR calc Af Amer: >60 mL/min (ref >60)
GFR calc non Af Amer: 56 mL/min — ABNORMAL LOW (ref >60)
Glucose, Bld: 92 mg/dL (ref 70–99)
Potassium: 3.5 mmol/L (ref 3.5–5.1)
Sodium: 135 mmol/L (ref 135–145)

## 2020-02-04 LAB — CBC
HCT: 24.9 % — ABNORMAL LOW (ref 36.0–46.0)
Hemoglobin: 7.7 g/dL — ABNORMAL LOW (ref 12.0–15.0)
MCH: 27.6 pg (ref 26.0–34.0)
MCHC: 30.9 g/dL (ref 30.0–36.0)
MCV: 89.2 fL (ref 80.0–100.0)
Platelets: 331 10*3/uL (ref 150–400)
RBC: 2.79 MIL/uL — ABNORMAL LOW (ref 3.87–5.11)
RDW: 15.6 % — ABNORMAL HIGH (ref 11.5–15.5)
WBC: 8.7 10*3/uL (ref 4.0–10.5)
nRBC: 1.1 % — ABNORMAL HIGH (ref 0.0–0.2)

## 2020-02-04 LAB — HEPARIN LEVEL (UNFRACTIONATED): Heparin Unfractionated: 0.3 IU/mL (ref 0.30–0.70)

## 2020-02-04 MED ORDER — HYDRALAZINE HCL 25 MG PO TABS
25.0000 mg | ORAL_TABLET | Freq: Three times a day (TID) | ORAL | Status: DC | PRN
  Administered 2020-02-04 – 2020-02-07 (×2): 25 mg via ORAL
  Filled 2020-02-04 (×3): qty 1

## 2020-02-04 MED ORDER — ENSURE ENLIVE PO LIQD
237.0000 mL | Freq: Two times a day (BID) | ORAL | Status: DC
  Administered 2020-02-04 – 2020-02-10 (×4): 237 mL via ORAL

## 2020-02-04 MED ORDER — GUAIFENESIN-DM 100-10 MG/5ML PO SYRP
5.0000 mL | ORAL_SOLUTION | ORAL | Status: DC | PRN
  Administered 2020-02-04 – 2020-02-09 (×6): 5 mL via ORAL
  Filled 2020-02-04 (×6): qty 5

## 2020-02-04 NOTE — Progress Notes (Signed)
PROGRESS NOTE    Barbara Hill  CHE:[id number] DOB: 11/22/1940 DOA: 01/24/2020 PCP: Aletha Halim., PA-C    Brief Narrative:  79 y.o. 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 Her right leg pain started acutely and was severe. She had also had long-term Holter monitoring performed in March of this year which showed no significant arrhythmias.  She does admit to still smoking cigarettes, but reports that she will quit as she will related with the pain that she had in her right leg that brought her to the hospital. Upon admission into the emergency department patient was noted to be afebrile with a pressure elevated up to 217/84, heart rates intermittently fluctuating from 60-130, and all other vital signs maintained.  Labs were significant for hemoglobin 10, BUN 18, and creatinine 1.21.  CT scan of the abdomen pelvis revealed bilateral popliteal embolus.  Patient was taken immediately to the operating grading room by vascular surgery for bilateral popliteal embolectomy.  Assessment & Plan:   Principal Problem:   Ischemic leg Active Problems:   Paroxysmal SVT (supraventricular tachycardia) (HCC)   Tobacco use disorder   COPD (chronic obstructive pulmonary disease) (HCC)   Normocytic anemia   History of CVA (cerebrovascular accident)   Anxiety   CKD (chronic kidney disease), stage III  Acute bilateral ischemic legs s/p bilateral popliteal embolectomy on 01/25/20 with subsequent 4 compartment fasciotomies on 01/26/20 CT abd/pelvis showed bilateral popliteal embolus Vascular Surgery consulted and patient underwent B popliteal exploration and embolectomy on 6/3 with subsequent 4 compartment fasciotomies, performed on 6/4 Underwent TEE 6/7 with no embolic source identified Pt is continued on heparin gtt  per Vascular Surgery  Paroxysmal SVT Stable Cont with home flecainide 71m twice daily  Hypomagnesemia Replace prn  COPD/tobacco use Patient without signs of acute exacerbation of her COPD.   Reportedly declined nicotine patch on presentation Cont with Albuterol nebs as needed shortness of breath/wheezing Advised to quit  Memory deficit with acute toxic metabolic encephalopathy secondary to enterococcus UTI Patient noted issues with memory and possibly confused at times Urine cx pos for enterococcus species Was on empiric rocephin, switched to amoxicillin. Will also give dose of diflucan given hx of yeast infections with abx  Acute blood loss anemia likely secondary to post op/recent surgery/hematoma Vs unlikely hematuria Unknown etiology Hemoglobin dropped to 6.4 on 01/31/20, s/p 2U of PRBC, last transfused on 02/02/20 Anemia panel ordered by cardiology despite transfusion still showed iron def, iron 15, sats 6%, Vit B12 223 FOBT negative Large blood on UA. Have discussed with Urology on call. Pt is currently treated for enterococcus UTI which is most likely the culprit for hematuria, per Urology. Urology recommendation to cont to treat for enterococcus UTI and monitor for clot formation resulting in bladder outlet obstruction. Ultimately when discharged, recommendation to follow up with pt's primary Urologist, Dr. MMatilde Sprangfor hematuria work up. Renal USS negative for any bleeding source Gave 1 dose of feraheme, started Vit B12 on 02/01/20 Daily CBC  History of CVA  Patient with previous history of stroke following procedure in 2017  Essential hypertension BP stable  Home medications include losartan 25 mg daily and metoprolol 25 mg nightly Continued on metoprolol  Chronic kidney disease stage IIIb Patient presents with creatinine of 1.21 which appears within her baseline of 1.2-1.3. Daily BMP  Anxiety Continue  Xanax 0.5 mg 3 times daily as needed and fluoxetine 20  mg twice daily  GERD Home meds include omeprazole 20 mg daily    DVT prophylaxis: Heparin gtt Code Status: Full Family Communication: Discussed extensively with daughter on 02/02/20   Status is: Inpatient  Remains inpatient appropriate because:Hemodynamically unstable, Ongoing diagnostic testing needed not appropriate for outpatient work up, IV treatments appropriate due to intensity of illness or inability to take PO and Inpatient level of care appropriate due to severity of illness   Dispo: The patient is from: Home              Anticipated d/c is to: CIR              Anticipated d/c date is: 3 days, when cleared by Vascular, cardiology              Patient currently is not medically stable to d/c.   Consultants:   Vascular Surgery  Cardiology  Discussed with urology  Procedures:  B popliteal exploration and embolectomy on 6/3 Emergent 4 compartment fasciotomy 6/4  Antimicrobials: Anti-infectives (From admission, onward)   Start     Dose/Rate Route Frequency Ordered Stop   01/30/20 1630  amoxicillin (AMOXIL) capsule 500 mg     Discontinue     500 mg Oral Every 8 hours 01/30/20 1619     01/30/20 1630  fluconazole (DIFLUCAN) tablet 150 mg        150 mg Oral  Once 01/30/20 1619 01/30/20 1733   01/28/20 1530  cefTRIAXone (ROCEPHIN) 1 g in sodium chloride 0.9 % 100 mL IVPB  Status:  Discontinued       Note to Pharmacy: Allergy list reviewed Per checklist, should be OK for cephalosporin   1 g 200 mL/hr over 30 Minutes Intravenous Every 24 hours 01/28/20 1358 01/30/20 1619   01/26/20 0918  ceFAZolin (ANCEF) 2-4 GM/100ML-% IVPB       Note to Pharmacy: Jasmine Pang   : cabinet override      01/26/20 0918 01/26/20 2129       Subjective: Met pt talking on the phone, denies any new complaints.     Objective: Vitals:   02/03/20 2308 02/04/20 0505 02/04/20 0748 02/04/20 1112  BP: (!) 169/72 (!) 151/56 (!) 155/62 (!) 153/61  Pulse: 69 65 60 63  Resp: 17 14 14 19     Temp: 99.4 F (37.4 C) 98 F (36.7 C) 98 F (36.7 C) 98 F (36.7 C)  TempSrc: Oral Oral Oral Oral  SpO2: 100% 92% 98% 100%  Weight:      Height:        Intake/Output Summary (Last 24 hours) at 02/04/2020 1519 Last data filed at 02/04/2020 1113 Gross per 24 hour  Intake 843 ml  Output 1200 ml  Net -357 ml   Filed Weights   01/24/20 2115 01/29/20 1046 02/02/20 0314  Weight: 48.5 kg 48.1 kg 50.2 kg    Examination:  General: NAD, frail, oriented, intermittent confusion  Cardiovascular: S1, S2 present  Respiratory: CTAB  Abdomen: Soft, nontender, nondistended, bowel sounds present  Musculoskeletal: Noted 4 incision in b/l thigh with staples, c/d/i, LLE 2+ edema noted, L foot drop  Skin: As mentioned above  Psychiatry: Normal mood    Data Reviewed: I have personally reviewed following labs and imaging studies  CBC: Recent Labs  Lab 02/01/20 1113 02/01/20 1113 02/02/20 0349 02/02/20 1202 02/03/20 0417 02/03/20 1651 02/04/20 0348  WBC 10.7*  --  9.2  --  7.6 7.5 8.7  HGB 7.5*   < > 6.8* 8.8* 8.1* 8.2* 7.7*  HCT 23.8*   < > 21.5* 28.2* 25.8* 26.3* 24.9*  MCV 87.2  --  88.5  --  89.0 89.2 89.2  PLT 273  --  283  --  324 346 331   < > = values in this interval not displayed.   Basic Metabolic Panel: Recent Labs  Lab 01/29/20 0316 01/30/20 0349 01/31/20 0012 02/01/20 0318 02/02/20 0349 02/03/20 0417 02/04/20 0348  NA 138   < > 139 136 137 138 135  K 3.7   < > 4.6 3.4* 4.1 4.4 3.5  CL 101   < > 102 104 103 101 98  CO2 28   < > 25 25 25 27 27   GLUCOSE 101*   < > 128* 124* 112* 100* 92  BUN 17   < > 18 19 17 12 9   CREATININE 1.03*   < > 1.06* 0.94 0.94 0.96 0.97  CALCIUM 8.7*   < > 8.8* 8.6* 8.7* 8.8* 8.6*  MG 2.3  --  1.4* 1.8  --   --   --    < > = values in this interval not displayed.   GFR: Estimated Creatinine Clearance: 37.9 mL/min (by C-G formula based on SCr of 0.97 mg/dL). Liver Function Tests: Recent Labs  Lab 01/29/20 0316  01/30/20 0349 01/31/20 0012  AST 26 19 17   ALT 11 11 12   ALKPHOS 50 45 46  BILITOT 0.4 0.6 0.7  PROT 5.9* 5.7* 5.6*  ALBUMIN 2.5* 2.4* 2.4*   No results for input(s): LIPASE, AMYLASE in the last 168 hours. No results for input(s): AMMONIA in the last 168 hours. Coagulation Profile: Recent Labs  Lab 01/29/20 0835  INR 1.3*   Cardiac Enzymes: No results for input(s): CKTOTAL, CKMB, CKMBINDEX, TROPONINI in the last 168 hours. BNP (last 3 results) No results for input(s): PROBNP in the last 8760 hours. HbA1C: No results for input(s): HGBA1C in the last 72 hours. CBG: No results for input(s): GLUCAP in the last 168 hours. Lipid Profile: No results for input(s): CHOL, HDL, LDLCALC, TRIG, CHOLHDL, LDLDIRECT in the last 72 hours. Thyroid Function Tests: No results for input(s): TSH, T4TOTAL, FREET4, T3FREE, THYROIDAB in the last 72 hours. Anemia Panel: No results for input(s): VITAMINB12, FOLATE, FERRITIN, TIBC, IRON, RETICCTPCT in the last 72 hours. Sepsis Labs: No results for input(s): PROCALCITON, LATICACIDVEN in the last 168 hours.  Recent Results (from the past 240 hour(s))  Culture, Urine     Status: Abnormal   Collection Time: 01/28/20  1:57 PM   Specimen: Urine, Random  Result Value Ref Range Status   Specimen Description URINE, RANDOM  Final   Special Requests   Final    NONE Performed at [facility], [address] [place], [place] [address]    Culture 20,000 COLONIES/mL ENTEROCOCCUS FAECALIS (A)  Final   Report Status 01/30/2020 FINAL  Final   Organism ID, Bacteria ENTEROCOCCUS FAECALIS (A)  Final      Susceptibility   Enterococcus faecalis - MIC*    AMPICILLIN <=2 SENSITIVE Sensitive     NITROFURANTOIN <=16 SENSITIVE Sensitive     VANCOMYCIN 1 SENSITIVE Sensitive     * 20,000 COLONIES/mL ENTEROCOCCUS FAECALIS     Radiology Studies: No results found.  Scheduled Meds: . amoxicillin  500 mg Oral Q8H  . Chlorhexidine Gluconate Cloth  6 each Topical  Daily  . flecainide  50 mg  Oral BID  . FLUoxetine  20 mg Oral BID  . metoprolol succinate  25 mg Oral QHS  . pantoprazole  40 mg Oral Daily  . sodium chloride flush  3 mL Intravenous Q12H  . vitamin B-12  1,000 mcg Oral Daily   Continuous Infusions: . heparin 1,000 Units/hr (02/03/20 2300)     LOS: 10 days   Alma Friendly, MD [facility] Pager On Amion  If 7PM-7AM, please contact night-coverage 02/04/2020, 3:19 PM

## 2020-02-04 NOTE — Progress Notes (Addendum)
.     Progress Note  Patient Name: Barbara Hill Date of Encounter: 02/04/2020  Primary Cardiologist: Dorris Carnes, MD   Subjective   Pt denies pain   No SOB   Alert   Inpatient Medications    Scheduled Meds: . amoxicillin  500 mg Oral Q8H  . Chlorhexidine Gluconate Cloth  6 each Topical Daily  . clopidogrel  75 mg Oral Daily  . flecainide  50 mg Oral BID  . FLUoxetine  20 mg Oral BID  . metoprolol succinate  25 mg Oral QHS  . pantoprazole  40 mg Oral Daily  . sodium chloride flush  3 mL Intravenous Q12H  . vitamin B-12  1,000 mcg Oral Daily   Continuous Infusions: . heparin 1,000 Units/hr (02/03/20 2300)     Vital Signs    Vitals:   02/03/20 2308 02/04/20 0505 02/04/20 0748 02/04/20 1112  BP: (!) 169/72 (!) 151/56 (!) 155/62 (!) 153/61  Pulse: 69 65 60 63  Resp: 17 14 14 19   Temp: 99.4 F (37.4 C) 98 F (36.7 C) 98 F (36.7 C) 98 F (36.7 C)  TempSrc: Oral Oral Oral Oral  SpO2: 100% 92% 98% 100%  Weight:      Height:        Intake/Output Summary (Last 24 hours) at 02/04/2020 1455 Last data filed at 02/04/2020 1113 Gross per 24 hour  Intake 843 ml  Output 1200 ml  Net -357 ml   Filed Weights   01/24/20 2115 01/29/20 1046 02/02/20 0314  Weight: 48.5 kg 48.1 kg 50.2 kg    Physical Exam   General: Frail, elderly, NAD Skin: Incisions dry Lungs: Clear to auscultation  Cardiovascular: RRR with S1 S2. No murmurs.  Abdomen: Soft, non-tender, non-distended Extremities:LLE with Tr swelling  RLE [facility]     Labs    Chemistry Recent Labs  Lab 01/29/20 0316 01/29/20 0316 01/30/20 0349 01/30/20 0349 01/31/20 0012 02/01/20 0318 02/02/20 0349 02/03/20 0417 02/04/20 0348  NA 138   < > 137   < > 139   < > 137 138 135  K 3.7   < > 3.3*   < > 4.6   < > 4.1 4.4 3.5  CL 101   < > 102   < > 102   < > 103 101 98  CO2 28   < > 25   < > 25   < > 25 27 27   GLUCOSE 101*   < > 99   < > 128*   < > 112* 100* 92  BUN 17   < > 18   < > 18   < > 17 12 9   CREATININE  1.03*   < > 0.96   < > 1.06*   < > 0.94 0.96 0.97  CALCIUM 8.7*   < > 8.7*   < > 8.8*   < > 8.7* 8.8* 8.6*  PROT 5.9*  --  5.7*  --  5.6*  --   --   --   --   ALBUMIN 2.5*  --  2.4*  --  2.4*  --   --   --   --   AST 26  --  19  --  17  --   --   --   --   ALT 11  --  11  --  12  --   --   --   --   ALKPHOS 50  --  45  --  46  --   --   --   --   BILITOT 0.4  --  0.6  --  0.7  --   --   --   --   GFRNONAA 52*   < > 57*   < > 50*   < > 58* 57* 56*  GFRAA >60   < > >60   < > 58*   < > >60 >60 >60  ANIONGAP 9   < > 10   < > 12   < > 9 10 10    < > = values in this interval not displayed.     Hematology Recent Labs  Lab 02/03/20 0417 02/03/20 1651 02/04/20 0348  WBC 7.6 7.5 8.7  RBC 2.90* 2.95* 2.79*  HGB 8.1* 8.2* 7.7*  HCT 25.8* 26.3* 24.9*  MCV 89.0 89.2 89.2  MCH 27.9 27.8 27.6  MCHC 31.4 31.2 30.9  RDW 15.6* 15.6* 15.6*  PLT 324 346 331    Cardiac EnzymesNo results for input(s): TROPONINI in the last 168 hours. No results for input(s): TROPIPOC in the last 168 hours.   BNPNo results for input(s): BNP, PROBNP in the last 168 hours.   DDimer No results for input(s): DDIMER in the last 168 hours.   Radiology    No results found. Telemetry    SR   - Personally Reviewed  ECG    No new tracing- Personally Reviewed  Cardiac Studies   TEE 01/29/20:  FINDINGS:  No LV apical thrombus No LA appendage thrombus or LA mural thrombus.  Mild-moderate aortic valve regurgitation.  No mobile valvular lesions. Negative for PFO.   Patient Profile     79 y.o. female with hx of HTN, dizziness, PSVT.   Admitted woith acute ischemia to legs   S/p embolectomy  Assessment & Plan    1  Bilateral popliteal emboli: -Pt presented with bilateral leg pain found to have bilateral popliteal emboli>>now s/p embolectomy 01/25/20>>had recurrent leg pain with 4 compartment fasciotomies, performed on 6/4 per VVS  TEE without source  Currently on heparin  Should be continued on  anticoagulation since event happened on plavix   Unfortunately her Hgb has continued to drop   She has required transfusion of pRBC and she has recived IV iron.  Will stop plavix   2  Anemia    The pt's Hgb at 8.2    No clear sources for drop  Hemoccult neg    She has been on Plavix instead of ASA through the years   I would stop,  Continue heparin   Goal for NOAC    Follow    3. HTN: -BP is under  control    4  Jx SVT   Remote  No occurrence here  5  Psych  Pt is much improved in MS from rest of week  Alert  At baseline   Will follow peripherally.   She has had no acitve cardiac issues     8/4 MD

## 2020-02-04 NOTE — Progress Notes (Addendum)
[facility] [place]  Subjective  - No new complaints   Objective (!) 155/62 60 98 F (36.7 C) (Oral) 14 98%  Intake/Output Summary (Last 24 hours) at 02/04/2020 0852 Last data filed at 02/04/2020 0500 Gross per 24 hour  Intake 603 ml  Output 700 ml  Net -97 ml   Left LE decreased edema and erythema.   Left decreased mobility with foot drop.  Minimal left toe and ankle motor.  Right LE good mobility and no edema. Right DP/PT and left PT/peroneal doppler signals Compartment incisions healing well.  No active bleeding or drainage from incisions.   Assessment/Planning: 79 y.o.femaleis s/p:  BLE thrombectomies 11 Days Post Op and and subsequent fasciotomies  10 Days Post-Op  Blood loss anemia of unknown cause.  Hemoccult neg and no incisional bleeding. HGB 7.7 down from 8.2 after 5 units of blood PRBC.  Will defer to primary team.   Mosetta Pigeon 02/04/2020 8:52 AM --  Laboratory Lab Results: Recent Labs    02/03/20 1651 02/04/20 0348  WBC 7.5 8.7  HGB 8.2* 7.7*  HCT 26.3* 24.9*  PLT 346 331   BMET Recent Labs    02/03/20 0417 02/04/20 0348  NA 138 135  K 4.4 3.5  CL 101 98  CO2 27 27  GLUCOSE 100* 92  BUN 12 9  CREATININE 0.96 0.97  CALCIUM 8.8* 8.6*    COAG Lab Results  Component Value Date   INR 1.3 (H) 01/29/2020   INR 1.11 11/12/2016   INR 1.10 06/22/2016   No results found for: PTT  I have seen and evaluated the patient. I agree with the PA note as documented above. 79 year old female status post bilateral lower extremity thrombectomies and subsequent fasciotomies for suspected embolic event.  Remains on heparin, Hgb 8.1 -->  8.2 --> 7.7.   Palpable right DP and very brisk dorsalis pedis and posterior tibial signals in the left foot.  Left leg is still swollen and she has evidence of some hematoma in the compartment but able to wiggle her toes and ankle and no significant pain and feels she is at her  baseline.  We will continue to monitor.  Continue to mobilize.  Cephus Shelling, MD [facility] [place] Office: [phone]

## 2020-02-04 NOTE — Progress Notes (Signed)
ANTICOAGULATION CONSULT NOTE - Follow Up Consult  Pharmacy Consult for Heparin Indication: s/p BL popliteal embolectomy 6/3  Allergies  Allergen Reactions  . Dobutamine Other (See Comments)    Heart beating hard with CP, neck pain, and weakness  . Epinephrine Other (See Comments)    Fast heart beat  . Darvocet [Propoxyphene N-Acetaminophen] Nausea And Vomiting  . Excedrin Extra Strength [Aspirin-Acetaminophen-Caffeine] Nausea And Vomiting  . Aspirin   . Clarithromycin Other (See Comments)    Yeast infection  . Doxycycline Other (See Comments)    Makes stomach hurt  . Penicillins Other (See Comments)    Yeast infection  . Prednisone Other (See Comments)    Shakes   . Propoxyphene Nausea And Vomiting    Patient Measurements: Height: 5\' 3"  (160 cm) Weight: 50.2 kg (110 lb 10.7 oz) IBW/kg (Calculated) : 52.4 Heparin Dosing Weight: 50.2 kg  Vital Signs: Temp: 98 F (36.7 C) (06/13 0748) Temp Source: Oral (06/13 0748) BP: 155/62 (06/13 0748) Pulse Rate: 60 (06/13 0748)  Labs: Recent Labs    02/02/20 0349 02/02/20 1202 02/03/20 0417 02/03/20 0417 02/03/20 1651 02/04/20 0348  HGB 6.8*   < > 8.1*   < > 8.2* 7.7*  HCT 21.5*   < > 25.8*  --  26.3* 24.9*  PLT 283  --  324  --  346 331  HEPARINUNFRC 0.30  --  0.37  --   --  0.30  CREATININE 0.94  --  0.96  --   --  0.97   < > = values in this interval not displayed.    Estimated Creatinine Clearance: 37.9 mL/min (by C-G formula based on SCr of 0.97 mg/dL).  Assessment: 79 yr old female on heparin for bilateral cardiogenic emboli, S/P bilateral embolectomy 6/3 and four compartment fasciotomy 6/4. No AC PTA. She is noted with hematoma of L calf and hematuria post-op. FOBT negative and Iron studies low > Feraheme IV x 1 given 6/10 pm and Vitamin B-12 begun.    Heparin level therapeutic (0.3) this am on 1000 units/hr.  Hgb with slow downtrend since transfusion 6/11. VVS watching L calf hematoma, not felt to be source of  anemia.  Hematuria thought due to Enterococcal UTI, on Amoxicillin.  Continues on Plavix as PTA.    Goal of Therapy:  Heparin level = 0.3- 0.5 Monitor platelets by anticoagulation protocol: Yes   Plan:  Continue heparin drip at 1000 units/hr Continue to target low therapeutic heparin levels. Daily heparin level and CBC Monitor for s/sx bleeding F/U transition to oral anticoagulation- would suggest apixaban given renal function    8/11, PharmD, BCPS, [facility] Clinical Pharmacist  Please check AMION for all [facility] Pharmacy phone numbers After 10:00 PM, call Main Pharmacy [phone]

## 2020-02-04 NOTE — Progress Notes (Signed)
Pt BP 168/63 (88). MD paged and PRN hydralazine 25mg  PO ordered and administered. See MAR.  , RN

## 2020-02-04 NOTE — Progress Notes (Addendum)
Plan of care reviewed. Pt's alert, oriented to self, but disoriented to time, place and situations, she pulled her IV on left arm out, she though she was at home, she told staff to look after her significant other on the next room and she call a staff "Pamelia Hoit" which is her daughter's name.  Her hemodynamically stable, no immediate distress noted. Remained afebrile. On room air, SPO2 98- 100%.    Left leg 2+ pitting edema with foot drop, cool touched. Right leg 1+ pitting edema. Doppler detected patent signals on DP, PT and anterior Popliteal bilaterally. Surgical wound dry and clean, no drainage. Pain tolerated well. Oxycodone given at bedtime. She's able to sleep well tonight. We will continue to monitor.  Filiberto Pinks, RN

## 2020-02-05 ENCOUNTER — Telehealth: Payer: Self-pay | Admitting: Student

## 2020-02-05 DIAGNOSIS — E43 Unspecified severe protein-calorie malnutrition: Secondary | ICD-10-CM | POA: Insufficient documentation

## 2020-02-05 LAB — CBC
HCT: 27.4 % — ABNORMAL LOW (ref 36.0–46.0)
Hemoglobin: 8.6 g/dL — ABNORMAL LOW (ref 12.0–15.0)
MCH: 28.1 pg (ref 26.0–34.0)
MCHC: 31.4 g/dL (ref 30.0–36.0)
MCV: 89.5 fL (ref 80.0–100.0)
Platelets: 383 10*3/uL (ref 150–400)
RBC: 3.06 MIL/uL — ABNORMAL LOW (ref 3.87–5.11)
RDW: 17 % — ABNORMAL HIGH (ref 11.5–15.5)
WBC: 8.1 10*3/uL (ref 4.0–10.5)
nRBC: 0.4 % — ABNORMAL HIGH (ref 0.0–0.2)

## 2020-02-05 LAB — HEPARIN LEVEL (UNFRACTIONATED): Heparin Unfractionated: 0.44 IU/mL (ref 0.30–0.70)

## 2020-02-05 MED ORDER — BUTALBITAL-APAP-CAFFEINE 50-325-40 MG PO TABS
1.0000 | ORAL_TABLET | Freq: Four times a day (QID) | ORAL | Status: DC | PRN
  Administered 2020-02-05 – 2020-02-10 (×10): 1 via ORAL
  Filled 2020-02-05 (×10): qty 1

## 2020-02-05 MED ORDER — ADULT MULTIVITAMIN W/MINERALS CH
1.0000 | ORAL_TABLET | Freq: Every day | ORAL | Status: DC
  Administered 2020-02-05 – 2020-02-10 (×6): 1 via ORAL
  Filled 2020-02-05 (×6): qty 1

## 2020-02-05 MED ORDER — APIXABAN 5 MG PO TABS
5.0000 mg | ORAL_TABLET | Freq: Two times a day (BID) | ORAL | Status: DC
  Administered 2020-02-05 – 2020-02-10 (×10): 5 mg via ORAL
  Filled 2020-02-05 (×11): qty 1

## 2020-02-05 NOTE — Progress Notes (Signed)
ANTICOAGULATION CONSULT NOTE - Follow Up Consult  Pharmacy Consult for Heparin Indication: s/p BL popliteal embolectomy 6/3  Allergies  Allergen Reactions  . Dobutamine Other (See Comments)    Heart beating hard with CP, neck pain, and weakness  . Epinephrine Other (See Comments)    Fast heart beat  . Darvocet [Propoxyphene N-Acetaminophen] Nausea And Vomiting  . Excedrin Extra Strength [Aspirin-Acetaminophen-Caffeine] Nausea And Vomiting  . Aspirin   . Clarithromycin Other (See Comments)    Yeast infection  . Doxycycline Other (See Comments)    Makes stomach hurt  . Penicillins Other (See Comments)    Yeast infection  . Prednisone Other (See Comments)    Shakes   . Propoxyphene Nausea And Vomiting    Patient Measurements: Height: 5\' 3"  (160 cm) Weight: 50.2 kg (110 lb 10.7 oz) IBW/kg (Calculated) : 52.4 Heparin Dosing Weight: 50.2 kg  Vital Signs: Temp: 97.4 F (36.3 C) (06/14 1210) Temp Source: Oral (06/14 1210) BP: 136/47 (06/14 1210) Pulse Rate: 81 (06/14 1210)  Labs: Recent Labs    02/03/20 0417 02/03/20 0417 02/03/20 1651 02/03/20 1651 02/04/20 0348 02/05/20 0435  HGB 8.1*   < > 8.2*   < > 7.7* 8.6*  HCT 25.8*   < > 26.3*  --  24.9* 27.4*  PLT 324   < > 346  --  331 383  HEPARINUNFRC 0.37  --   --   --  0.30 0.44  CREATININE 0.96  --   --   --  0.97  --    < > = values in this interval not displayed.    Estimated Creatinine Clearance: 37.9 mL/min (by C-G formula based on SCr of 0.97 mg/dL).  Assessment: 79 yr old female on heparin for bilateral cardiogenic emboli, S/P bilateral embolectomy 6/3 and four compartment fasciotomy 6/4. No AC PTA. She is noted with hematoma of L calf and hematuria post-op. FOBT negative and Iron studies low > Feraheme IV x 1 given 6/10 pm and Vitamin B-12 begun.    Heparin level therapeutic at 0.44, CBC stable, no S/Sx bleeding documented.    Goal of Therapy:  Heparin level = 0.3-0.5 Monitor platelets by  anticoagulation protocol: Yes   Plan:  -Continue IV heparin 1000 units/h -Daily heparin level and CBC  8/4, PharmD, BCPS Clinical Pharmacist [phone] Please check AMION for all [facility] Pharmacy numbers 02/05/2020

## 2020-02-05 NOTE — Telephone Encounter (Signed)
NIR.  Received prescription refill request for Plavix. Faxed filled prescription to [facility] [phone]) at 1412- Plavix 75 mg tablets, take one tablet by mouth once daily, dispense 30 tablets with 3 refills.   Waylan Boga Chayce Robbins, PA-C 02/05/2020, 2:25 PM

## 2020-02-05 NOTE — Progress Notes (Signed)
ANTICOAGULATION CONSULT NOTE - Follow Up Consult  Pharmacy Consult for Heparin >> Apixaban Indication: s/p BL popliteal embolectomy   Allergies  Allergen Reactions  . Dobutamine Other (See Comments)    Heart beating hard with CP, neck pain, and weakness  . Epinephrine Other (See Comments)    Fast heart beat  . Darvocet [Propoxyphene N-Acetaminophen] Nausea And Vomiting  . Excedrin Extra Strength [Aspirin-Acetaminophen-Caffeine] Nausea And Vomiting  . Aspirin   . Clarithromycin Other (See Comments)    Yeast infection  . Doxycycline Other (See Comments)    Makes stomach hurt  . Penicillins Other (See Comments)    Yeast infection  . Prednisone Other (See Comments)    Shakes   . Propoxyphene Nausea And Vomiting    Patient Measurements: Height: 5\' 3"  (160 cm) Weight: 50.2 kg (110 lb 10.7 oz) IBW/kg (Calculated) : 52.4 Heparin Dosing Weight: 50.2 kg  Vital Signs: Temp: 97.4 F (36.3 C) (06/14 1210) Temp Source: Oral (06/14 1210) BP: 136/47 (06/14 1210) Pulse Rate: 81 (06/14 1210)  Labs: Recent Labs    02/03/20 0417 02/03/20 0417 02/03/20 1651 02/03/20 1651 02/04/20 0348 02/05/20 0435  HGB 8.1*   < > 8.2*   < > 7.7* 8.6*  HCT 25.8*   < > 26.3*  --  24.9* 27.4*  PLT 324   < > 346  --  331 383  HEPARINUNFRC 0.37  --   --   --  0.30 0.44  CREATININE 0.96  --   --   --  0.97  --    < > = values in this interval not displayed.    Estimated Creatinine Clearance: 37.9 mL/min (by C-G formula based on SCr of 0.97 mg/dL).  Assessment: 79 yr old female on heparin for bilateral cardiogenic emboli, S/P bilateral embolectomy 6/3 and four compartment fasciotomy 6/4. Pt was on no anticoagulant PTA. She is noted with hematoma of L calf and hematuria post-op. FOBT negative and iron studies low > Feraheme IV x 1 given 6/10 pm and Vitamin B-12 begun.    Pt was on heparin infusion prior to embolectomy and continuously since 01/27/20. Heparin level therapeutic this AM at 0.44 units/ml  on heparin infusion at 1000 units/hr, CBC stable, no S/Sx bleeding documented.  Pt has been on heparin infusion for >> week; both primary team and Vascular Surgery support omitting apixaban 10 mg po BID X 7 days, so will start with apixaban 5 mg po BID.   Goal of Therapy:  Heparin level: 0.3-0.5 units/ml Monitor platelets by anticoagulation protocol: Yes   Plan:  Stop heparin infusion at 1800 this evening and start apixaban 5 mg po BID at the same time Monitor CBC, signs/symptoms of bleeding  03/28/20, PharmD, BCPS, [facility] Clinical Pharmacist 02/05/2020

## 2020-02-05 NOTE — Progress Notes (Signed)
Progress Note  Patient Name: Barbara Hill Date of Encounter: 02/05/2020  [facility] Cardiologist: Dorris Carnes, MD   Subjective   Leg pain is mildly improved no chest pain no shortness of breath  Inpatient Medications    Scheduled Meds: . amoxicillin  500 mg Oral Q8H  . Chlorhexidine Gluconate Cloth  6 each Topical Daily  . feeding supplement (ENSURE ENLIVE)  237 mL Oral BID BM  . flecainide  50 mg Oral BID  . FLUoxetine  20 mg Oral BID  . metoprolol succinate  25 mg Oral QHS  . pantoprazole  40 mg Oral Daily  . sodium chloride flush  3 mL Intravenous Q12H  . vitamin B-12  1,000 mcg Oral Daily   Continuous Infusions: . heparin 1,000 Units/hr (02/05/20 0018)   PRN Meds: acetaminophen **OR** acetaminophen, ALPRAZolam, guaiFENesin-dextromethorphan, hydrALAZINE, morphine injection, ondansetron **OR** ondansetron (ZOFRAN) IV, oxyCODONE   Vital Signs    Vitals:   02/04/20 2348 02/05/20 0400 02/05/20 0551 02/05/20 0729  BP: (!) 155/59 (!) 164/70 (!) 151/63 (!) 161/66  Pulse: 60 (!) 58 63 (!) 56  Resp: 16 18 18 17   Temp: 98.2 F (36.8 C) 98 F (36.7 C)  98.2 F (36.8 C)  TempSrc: Tympanic Oral  Axillary  SpO2: 94% 95% 98% 96%  Weight:      Height:        Intake/Output Summary (Last 24 hours) at 02/05/2020 1030 Last data filed at 02/05/2020 0800 Gross per 24 hour  Intake 876 ml  Output 1000 ml  Net -124 ml   Last 3 Weights 02/02/2020 01/29/2020 01/24/2020  Weight (lbs) 110 lb 10.7 oz 106 lb 107 lb  Weight (kg) 50.2 kg 48.081 kg 48.535 kg      Telemetry    Sinus brady/ rhythm- Personally Reviewed    Physical Exam   GEN: No acute distress.  Frail Neck: No JVD Cardiac: RRR, no murmurs, rubs, or gallops.  Respiratory: Clear to auscultation bilaterally. GI: Soft, nontender, non-distended  MS:  Minor edema, staples B No deformity. Neuro:  Nonfocal  Psych: Normal affect   Labs    High Sensitivity Troponin:  No results for input(s): TROPONINIHS in the last  720 hours.    Chemistry Recent Labs  Lab 01/30/20 0349 01/30/20 0349 01/31/20 0012 02/01/20 0318 02/02/20 0349 02/03/20 0417 02/04/20 0348  NA 137   < > 139   < > 137 138 135  K 3.3*   < > 4.6   < > 4.1 4.4 3.5  CL 102   < > 102   < > 103 101 98  CO2 25   < > 25   < > 25 27 27   GLUCOSE 99   < > 128*   < > 112* 100* 92  BUN 18   < > 18   < > 17 12 9   CREATININE 0.96   < > 1.06*   < > 0.94 0.96 0.97  CALCIUM 8.7*   < > 8.8*   < > 8.7* 8.8* 8.6*  PROT 5.7*  --  5.6*  --   --   --   --   ALBUMIN 2.4*  --  2.4*  --   --   --   --   AST 19  --  17  --   --   --   --   ALT 11  --  12  --   --   --   --   Arabella Merles  45  --  46  --   --   --   --   BILITOT 0.6  --  0.7  --   --   --   --   GFRNONAA 57*   < > 50*   < > 58* 57* 56*  GFRAA >60   < > 58*   < > >60 >60 >60  ANIONGAP 10   < > 12   < > 9 10 10    < > = values in this interval not displayed.     Hematology Recent Labs  Lab 02/03/20 1651 02/04/20 0348 02/05/20 0435  WBC 7.5 8.7 8.1  RBC 2.95* 2.79* 3.06*  HGB 8.2* 7.7* 8.6*  HCT 26.3* 24.9* 27.4*  MCV 89.2 89.2 89.5  MCH 27.8 27.6 28.1  MCHC 31.2 30.9 31.4  RDW 15.6* 15.6* 17.0*  PLT 346 331 383    BNPNo results for input(s): BNP, PROBNP in the last 168 hours.   DDimer No results for input(s): DDIMER in the last 168 hours.   Radiology    No results found.  Cardiac Studies   TEE 01/29/20:  FINDINGS: No LV apical thrombus No LA appendage thrombus or LA mural thrombus.  Mild-moderate aortic valve regurgitation.  No mobile valvular lesions. Negative for PFO.  Patient Profile     79 y.o. female here with bilateral popliteal emboli anemia hypertension PSVT  Assessment & Plan    Bilateral popliteal emboli -Transesophageal echocardiogram did not show any embolic source. -Continuing with IV heparin.  Plavix was stopped because of decreasing hemoglobin.  Anemia -Previously no clear source, Hemoccult negative.  Currently on heparin IV.  Compartment  syndrome -4 compartment fasciotomy on 6/4.  Paroxysmal supraventricular tachycardia -Remote.  Doing well.  On home flecainide 50 twice a day Toprol.  Please let 8/4 know if we can be of further assistance. At some point, consider transition to DOAC      For questions or updates, please contact [facility] Please consult [email or web address] for contact info under        Signed, [place], MD  02/05/2020, 10:30 AM

## 2020-02-05 NOTE — Progress Notes (Signed)
PROGRESS NOTE    Barbara Hill  HYW:[id number] DOB: April 24, 1941 DOA: 01/24/2020 PCP: Aletha Halim., PA-C    Brief Narrative:  79 y.o. 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 Her right leg pain started acutely and was severe. She had also had long-term Holter monitoring performed in March of this year which showed no significant arrhythmias.  She does admit to still smoking cigarettes, but reports that she will quit as she will related with the pain that she had in her right leg that brought her to the hospital. Upon admission into the emergency department patient was noted to be afebrile with a pressure elevated up to 217/84, heart rates intermittently fluctuating from 60-130, and all other vital signs maintained.  Labs were significant for hemoglobin 10, BUN 18, and creatinine 1.21.  CT scan of the abdomen pelvis revealed bilateral popliteal embolus.  Patient was taken immediately to the operating grading room by vascular surgery for bilateral popliteal embolectomy.  Assessment & Plan:   Principal Problem:   Ischemic leg Active Problems:   Paroxysmal SVT (supraventricular tachycardia) (HCC)   Tobacco use disorder   COPD (chronic obstructive pulmonary disease) (HCC)   Normocytic anemia   History of CVA (cerebrovascular accident)   Anxiety   CKD (chronic Hill disease), stage III   Protein-calorie malnutrition, severe  Acute bilateral ischemic legs s/p bilateral popliteal embolectomy on 01/25/20 with subsequent 4 compartment fasciotomies on 01/26/20 CT abd/pelvis showed bilateral popliteal embolus Vascular Surgery consulted and patient underwent B popliteal exploration and embolectomy on 6/3 with subsequent 4 compartment fasciotomies, performed on 6/4 Underwent TEE 6/7 with no embolic source  identified Pt is continued on heparin gtt per Vascular Surgery, ok to switch to PO NOAC,pharmacy consulted  Paroxysmal SVT Stable Cont with home flecainide 50mg  twice daily  Hypomagnesemia Replace prn  COPD/tobacco use Patient without signs of acute exacerbation of her COPD.   Reportedly declined nicotine patch on presentation Cont with Albuterol nebs as needed shortness of breath/wheezing Advised to quit  Memory deficit with acute toxic metabolic encephalopathy secondary to enterococcus UTI Patient noted issues with memory and possibly confused at times Urine cx pos for enterococcus species Was on empiric rocephin, switched to amoxicillin. Will also give dose of diflucan given hx of yeast infections with abx  Acute blood loss anemia likely secondary to post op/recent surgery/hematoma Vs unlikely hematuria Unknown etiology Hemoglobin dropped to 6.4 on 01/31/20, s/p 2U of PRBC, last transfused on 02/02/20 Anemia panel ordered by cardiology despite transfusion still showed iron def, iron 15, sats 6%, Vit B12 223 FOBT negative Large blood on UA. Have discussed with Urology on call. Pt is currently treated for enterococcus UTI which is most likely the culprit for hematuria, per Urology. Urology recommendation to cont to treat for enterococcus UTI and monitor for clot formation resulting in bladder outlet obstruction. Ultimately when discharged, recommendation to follow up with pt's primary Urologist, Dr. Matilde Sprang for hematuria work up. Renal USS negative for any bleeding source Gave 1 dose of feraheme, started Vit B12 on 02/01/20 Daily CBC  History of CVA  Patient with previous history of stroke following procedure in 2017 Continue plavix  Essential hypertension BP stable  Home medications include losartan 25 mg daily and metoprolol 25 mg nightly  Chronic Hill disease stage IIIb Patient presents with creatinine of 1.21 which  appears within her baseline of 1.2-1.3. Daily  BMP  Anxiety Continue Xanax 0.5 mg 3 times daily as needed and fluoxetine 20 mg twice daily  GERD Home meds include omeprazole 20 mg daily    DVT prophylaxis: Heparin gtt Code Status: Full Family Communication: Discussed extensively with daughter on 02/02/20   Status is: Inpatient  Remains inpatient appropriate because:Hemodynamically unstable, Ongoing diagnostic testing needed not appropriate for outpatient work up, IV treatments appropriate due to intensity of illness or inability to take PO and Inpatient level of care appropriate due to severity of illness   Dispo: The patient is from: Home              Anticipated d/c is to: Home vs SNF              Anticipated d/c date is: 1 day, when PT/OT has re-evaluated and pt off heparin ggt              Patient currently is not medically stable to d/c.   Consultants:   Vascular Surgery  Cardiology  Discussed with urology  Procedures:  B popliteal exploration and embolectomy on 6/3 Emergent 4 compartment fasciotomy 6/4  Antimicrobials: Anti-infectives (From admission, onward)   Start     Dose/Rate Route Frequency Ordered Stop   01/30/20 1630  amoxicillin (AMOXIL) capsule 500 mg     Discontinue     500 mg Oral Every 8 hours 01/30/20 1619     01/30/20 1630  fluconazole (DIFLUCAN) tablet 150 mg        150 mg Oral  Once 01/30/20 1619 01/30/20 1733   01/28/20 1530  cefTRIAXone (ROCEPHIN) 1 g in sodium chloride 0.9 % 100 mL IVPB  Status:  Discontinued       Note to Pharmacy: Allergy list reviewed Per checklist, should be OK for cephalosporin   1 g 200 mL/hr over 30 Minutes Intravenous Every 24 hours 01/28/20 1358 01/30/20 1619   01/26/20 0918  ceFAZolin (ANCEF) 2-4 GM/100ML-% IVPB       Note to Pharmacy: Evern Bio   : cabinet override      01/26/20 0918 01/26/20 2129       Subjective: Pt denies any new complaints, LLE less tender and swollen today    Objective: Vitals:   02/05/20 0400 02/05/20 0551 02/05/20  0729 02/05/20 1210  BP: (!) 164/70 (!) 151/63 (!) 161/66 (!) 136/47  Pulse: (!) 58 63 (!) 56 81  Resp: 18 18 17 18   Temp: 98 F (36.7 C)  98.2 F (36.8 C) (!) 97.4 F (36.3 C)  TempSrc: Oral  Axillary Oral  SpO2: 95% 98% 96% 97%  Weight:      Height:        Intake/Output Summary (Last 24 hours) at 02/05/2020 1458 Last data filed at 02/05/2020 0800 Gross per 24 hour  Intake 516 ml  Output 400 ml  Net 116 ml   Filed Weights   01/24/20 2115 01/29/20 1046 02/02/20 0314  Weight: 48.5 kg 48.1 kg 50.2 kg    Examination:  General: NAD, frail, oriented  Cardiovascular: S1, S2 present  Respiratory: CTAB  Abdomen: Soft, nontender, nondistended, bowel sounds present  Musculoskeletal: Noted 4 incision in b/l thigh with staples, c/d/i, LLE 1+ edema noted, L foot drop  Skin: As mentioned above  Psychiatry: Normal mood    Data Reviewed: I have personally reviewed following labs and imaging studies  CBC: Recent Labs  Lab 02/02/20 0349 02/02/20 0349 02/02/20 1202 02/03/20 0417 02/03/20  1651 02/04/20 0348 02/05/20 0435  WBC 9.2  --   --  7.6 7.5 8.7 8.1  HGB 6.8*   < > 8.8* 8.1* 8.2* 7.7* 8.6*  HCT 21.5*   < > 28.2* 25.8* 26.3* 24.9* 27.4*  MCV 88.5  --   --  89.0 89.2 89.2 89.5  PLT 283  --   --  324 346 331 383   < > = values in this interval not displayed.   Basic Metabolic Panel: Recent Labs  Lab 01/31/20 0012 02/01/20 0318 02/02/20 0349 02/03/20 0417 02/04/20 0348  NA 139 136 137 138 135  K 4.6 3.4* 4.1 4.4 3.5  CL 102 104 103 101 98  CO2 25 25 25 27 27   GLUCOSE 128* 124* 112* 100* 92  BUN 18 19 17 12 9   CREATININE 1.06* 0.94 0.94 0.96 0.97  CALCIUM 8.8* 8.6* 8.7* 8.8* 8.6*  MG 1.4* 1.8  --   --   --    GFR: Estimated Creatinine Clearance: 37.9 mL/min (by C-G formula based on SCr of 0.97 mg/dL). Liver Function Tests: Recent Labs  Lab 01/30/20 0349 01/31/20 0012  AST 19 17  ALT 11 12  ALKPHOS 45 46  BILITOT 0.6 0.7  PROT 5.7* 5.6*  ALBUMIN  2.4* 2.4*   No results for input(s): LIPASE, AMYLASE in the last 168 hours. No results for input(s): AMMONIA in the last 168 hours. Coagulation Profile: No results for input(s): INR, PROTIME in the last 168 hours. Cardiac Enzymes: No results for input(s): CKTOTAL, CKMB, CKMBINDEX, TROPONINI in the last 168 hours. BNP (last 3 results) No results for input(s): PROBNP in the last 8760 hours. HbA1C: No results for input(s): HGBA1C in the last 72 hours. CBG: No results for input(s): GLUCAP in the last 168 hours. Lipid Profile: No results for input(s): CHOL, HDL, LDLCALC, TRIG, CHOLHDL, LDLDIRECT in the last 72 hours. Thyroid Function Tests: No results for input(s): TSH, T4TOTAL, FREET4, T3FREE, THYROIDAB in the last 72 hours. Anemia Panel: No results for input(s): VITAMINB12, FOLATE, FERRITIN, TIBC, IRON, RETICCTPCT in the last 72 hours. Sepsis Labs: No results for input(s): PROCALCITON, LATICACIDVEN in the last 168 hours.  Recent Results (from the past 240 hour(s))  Culture, Urine     Status: Abnormal   Collection Time: 01/28/20  1:57 PM   Specimen: Urine, Random  Result Value Ref Range Status   Specimen Description URINE, RANDOM  Final   Special Requests   Final    NONE Performed at [facility], [address], [place], [address] [place]    Culture 20,000 COLONIES/mL ENTEROCOCCUS FAECALIS (A)  Final   Report Status 01/30/2020 FINAL  Final   Organism ID, Bacteria ENTEROCOCCUS FAECALIS (A)  Final      Susceptibility   Enterococcus faecalis - MIC*    AMPICILLIN <=2 SENSITIVE Sensitive     NITROFURANTOIN <=16 SENSITIVE Sensitive     VANCOMYCIN 1 SENSITIVE Sensitive     * 20,000 COLONIES/mL ENTEROCOCCUS FAECALIS     Radiology Studies: No results found.  Scheduled Meds: . amoxicillin  500 mg Oral Q8H  . Chlorhexidine Gluconate Cloth  6 each Topical Daily  . feeding supplement (ENSURE ENLIVE)  237 mL Oral BID BM  . flecainide  50 mg Oral BID  . FLUoxetine  20 mg Oral  BID  . metoprolol succinate  25 mg Oral QHS  . multivitamin with minerals  1 tablet Oral Daily  . pantoprazole  40 mg Oral Daily  . sodium  chloride flush  3 mL Intravenous Q12H  . vitamin B-12  1,000 mcg Oral Daily   Continuous Infusions: . heparin 1,000 Units/hr (02/05/20 0018)     LOS: 11 days   Briant Cedar, MD [facility] Pager On Amion  If 7PM-7AM, please contact night-coverage 02/05/2020, 2:58 PM

## 2020-02-05 NOTE — Progress Notes (Addendum)
  Progress Note    02/05/2020 7:26 AM 7 Days Post-Op  Subjective:  L LE feels better this morning   Vitals:   02/05/20 0400 02/05/20 0551  BP: (!) 164/70 (!) 151/63  Pulse: (!) 58 63  Resp: 18 18  Temp: 98 F (36.7 C)   SpO2: 95% 98%   Physical Exam: Lungs:  Non labored Incisions:  All incisions BLE c/d/i Extremities:  Palpable R DP; palpable L PT Abdomen:  soft Neurologic: more alert this morning  CBC    Component Value Date/Time   WBC 8.1 02/05/2020 0435   RBC 3.06 (L) 02/05/2020 0435   HGB 8.6 (L) 02/05/2020 0435   HGB 10.6 (L) 04/10/2019 1047   HCT 27.4 (L) 02/05/2020 0435   HCT 31.9 (L) 04/10/2019 1047   PLT 383 02/05/2020 0435   PLT 308 04/10/2019 1047   MCV 89.5 02/05/2020 0435   MCV 91 04/10/2019 1047   MCV 92 10/23/2013 0803   MCH 28.1 02/05/2020 0435   MCHC 31.4 02/05/2020 0435   RDW 17.0 (H) 02/05/2020 0435   RDW 11.3 (L) 04/10/2019 1047   RDW 14.0 10/23/2013 0803   LYMPHSABS 2.6 01/24/2020 2200   LYMPHSABS 2.4 10/23/2013 0803   MONOABS 0.7 01/24/2020 2200   MONOABS 0.7 10/23/2013 0803   EOSABS 0.3 01/24/2020 2200   EOSABS 0.3 10/23/2013 0803   BASOSABS 0.0 01/24/2020 2200   BASOSABS 0.1 10/23/2013 0803    BMET    Component Value Date/Time   NA 135 02/04/2020 0348   NA 139 04/10/2019 1047   NA 140 10/23/2013 0803   K 3.5 02/04/2020 0348   K 4.6 10/23/2013 0803   CL 98 02/04/2020 0348   CL 107 10/23/2013 0803   CO2 27 02/04/2020 0348   CO2 28 10/23/2013 0803   GLUCOSE 92 02/04/2020 0348   GLUCOSE 87 10/23/2013 0803   BUN 9 02/04/2020 0348   BUN 19 04/10/2019 1047   BUN 6 (L) 10/23/2013 0803   CREATININE 0.97 02/04/2020 0348   CREATININE 0.66 07/24/2016 1125   CALCIUM 8.6 (L) 02/04/2020 0348   CALCIUM 8.8 10/23/2013 0803   GFRNONAA 56 (L) 02/04/2020 0348   GFRNONAA >60 10/23/2013 0803   GFRAA >60 02/04/2020 0348   GFRAA >60 10/23/2013 0803    INR    Component Value Date/Time   INR 1.3 (H) 01/29/2020 0835   INR 1.1  10/22/2013 0125     Intake/Output Summary (Last 24 hours) at 02/05/2020 0726 Last data filed at 02/05/2020 0018 Gross per 24 hour  Intake 756 ml  Output 600 ml  Net 156 ml     Assessment/Plan:  79 y.o. female is s/p BLE thrombectomy and subsequent fasciotomies 7 Days Post-Op   BLE well perfused with palpable pedal pulses Staples out at the end of the week Nothing to add from vascular standpoint   Emilie Rutter, PA-C [facility] [phone] 02/05/2020 7:26 AM  I have examined the patient, reviewed and agree with above.  Less soreness in her posterior calf.  Bruising is resolving as is her swelling.  Continue to mobilize.  It is safe to begin oral anticoagulant at any time.  Can be discharged to home or skilled nursing facility from vascular standpoint.  I discussed this at length with the patient and her daughter present.  Her daughter states that she is at home and will be able to care for her mother after discharge  Gretta Began, MD 02/05/2020 12:56 PM

## 2020-02-05 NOTE — Progress Notes (Signed)
Initial Nutrition Assessment  DOCUMENTATION CODES:   Severe malnutrition in context of chronic illness  INTERVENTION:    Ensure Enlive po TID, each supplement provides 350 kcal and 20 grams of protein  MVI daily   NUTRITION DIAGNOSIS:   Severe Malnutrition related to chronic illness (COPD) as evidenced by severe fat depletion, severe muscle depletion.  GOAL:   Patient will meet greater than or equal to 90% of their needs  MONITOR:   PO intake, Supplement acceptance, Weight trends, Labs, I & O's, Skin  REASON FOR ASSESSMENT:   Malnutrition Screening Tool    ASSESSMENT:   Patient with PMH significant for COPD, HTN, R MCA aneurysm s/p stenting complicated by post-op CVA, and anemia. Presents this admission with bilateral ischemic lower extremities.   6/3- s/p bilateral popliteal exploration and embolectomy 6/4- s/p bilateral lower extremity 4 compartment fasciotomy   Pt endorses on/off appetite over the last 6 months due to unknown reasons. During this time she consumed 2 meals daily that consisted of yogurt, eggs, boiled eggs, cheese, and vegetables. Has Ensure available but intake is consistent. Appetite slow to progressing this admission. Meal completions charted a 0-40% for her last 5 meals. Discussed the importance of protein intake for preservation of lean body mass. Reviewed high protein high kcal food options pt can continue at home. Okay to have Ensure this admission.   Pt endorses a UBW of 110 lb and denies recent wt loss. Records indicate pt weighed 107 lb on 5/12 and 110 lb this admission. Suspect fluid may be masking losses.   Medications: Vit B12 Labs: CBG 92  NUTRITION - FOCUSED PHYSICAL EXAM:    Most Recent Value  Orbital Region Moderate depletion  Upper Arm Region Severe depletion  Thoracic and Lumbar Region Unable to assess  Buccal Region Severe depletion  Temple Region Severe depletion  Clavicle Bone Region Severe depletion  Clavicle and Acromion  Bone Region Severe depletion  Scapular Bone Region Severe depletion  Dorsal Hand Severe depletion  Patellar Region Severe depletion  Anterior Thigh Region Moderate depletion  Posterior Calf Region Moderate depletion  Edema (RD Assessment) Moderate  Hair Reviewed  Eyes Reviewed  Mouth Reviewed  Skin Reviewed  Nails Reviewed     Diet Order:   Diet Order            Diet Heart Room service appropriate? Yes with Assist; Fluid consistency: Thin  Diet effective now                 EDUCATION NEEDS:   Education needs have been addressed  Skin:  Skin Assessment: Skin Integrity Issues: Skin Integrity Issues:: Incisions Incisions: bilateral legs  Last BM:  6/12  Height:   Ht Readings from Last 1 Encounters:  01/29/20 5\' 3"  (1.6 m)    Weight:   Wt Readings from Last 1 Encounters:  02/02/20 50.2 kg    BMI:  Body mass index is 19.6 kg/m.  Estimated Nutritional Needs:   Kcal:  1500-1700 kcal  Protein:  75-90 grams  Fluid:  >/= 1.5 L/day   04/03/20 RD, LDN Clinical Nutrition Pager listed in AMION

## 2020-02-06 LAB — CBC
HCT: 27.6 % — ABNORMAL LOW (ref 36.0–46.0)
Hemoglobin: 8.6 g/dL — ABNORMAL LOW (ref 12.0–15.0)
MCH: 28.3 pg (ref 26.0–34.0)
MCHC: 31.2 g/dL (ref 30.0–36.0)
MCV: 90.8 fL (ref 80.0–100.0)
Platelets: 374 10*3/uL (ref 150–400)
RBC: 3.04 MIL/uL — ABNORMAL LOW (ref 3.87–5.11)
RDW: 17.8 % — ABNORMAL HIGH (ref 11.5–15.5)
WBC: 7.9 10*3/uL (ref 4.0–10.5)
nRBC: 0 % (ref 0.0–0.2)

## 2020-02-06 MED ORDER — SODIUM CHLORIDE 0.9 % IV SOLN: INTRAVENOUS | Status: DC

## 2020-02-06 NOTE — Progress Notes (Addendum)
PROGRESS NOTE    Barbara Hill  JOI:[id number] DOB: 01-06-1941 DOA: 01/24/2020 PCP: Aletha Halim., PA-C    Brief Narrative:  79 y.o. 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 Her right leg pain started acutely and was severe. She had also had long-term Holter monitoring performed in March of this year which showed no significant arrhythmias.  She does admit to still smoking cigarettes, but reports that she will quit as she will related with the pain that she had in her right leg that brought her to the hospital. Upon admission into the emergency department patient was noted to be afebrile with a pressure elevated up to 217/84, heart rates intermittently fluctuating from 60-130, and all other vital signs maintained.  Labs were significant for hemoglobin 10, BUN 18, and creatinine 1.21.  CT scan of the abdomen pelvis revealed bilateral popliteal embolus.  Patient was taken immediately to the operating grading room by vascular surgery for bilateral popliteal embolectomy.  Assessment & Plan:   Principal Problem:   Ischemic leg Active Problems:   Paroxysmal SVT (supraventricular tachycardia) (HCC)   Tobacco use disorder   COPD (chronic obstructive pulmonary disease) (HCC)   Normocytic anemia   History of CVA (cerebrovascular accident)   Anxiety   CKD (chronic kidney disease), stage III   Protein-calorie malnutrition, severe  Acute bilateral ischemic legs s/p bilateral popliteal embolectomy on 01/25/20 with subsequent 4 compartment fasciotomies on 01/26/20 CT abd/pelvis showed bilateral popliteal embolus Vascular Surgery consulted and patient underwent B popliteal exploration and embolectomy on 6/3 with subsequent 4 compartment fasciotomies, performed on 6/4 Underwent TEE 6/7 with no embolic source  identified Continue Eliquis  Orthostatic hypotension Noted while working with PT Will gently hydrate her and reassess in am Daily orthostatic vitals  Paroxysmal SVT Stable Cont with home flecainide 50mg  twice daily  Hypomagnesemia Replace prn  COPD/tobacco use Patient without signs of acute exacerbation of her COPD.   Reportedly declined nicotine patch on presentation Cont with Albuterol nebs as needed shortness of breath/wheezing Advised to quit  Memory deficit with acute toxic metabolic encephalopathy secondary to enterococcus UTI Patient noted issues with memory and possibly confused at times Urine cx pos for enterococcus species Was on empiric rocephin, switched to amoxicillin, completed >7 days  Acute blood loss anemia likely secondary to post op/recent surgery/hematoma Vs unlikely hematuria Unknown etiology Hemoglobin dropped to 6.4 on 01/31/20, s/p 2U of PRBC, last transfused on 02/02/20 Anemia panel ordered by cardiology despite transfusion still showed iron def, iron 15, sats 6%, Vit B12 223 FOBT negative Large blood on UA. Have discussed with Urology on call. Pt is currently treated for enterococcus UTI which is most likely the culprit for hematuria, per Urology. Urology recommendation to cont to treat for enterococcus UTI and monitor for clot formation resulting in bladder outlet obstruction. Ultimately when discharged, recommendation to follow up with pt's primary Urologist, Dr. Matilde Sprang for hematuria work up. Renal USS negative for any bleeding source Gave 1 dose of feraheme, started Vit B12 on 02/01/20 Daily CBC  History of CVA  Patient with previous history of stroke following procedure in 2017 Continue plavix  Essential hypertension BP stable  Home medications include losartan 25 mg daily and metoprolol 25 mg nightly  Chronic kidney disease stage IIIb Patient presents with creatinine of 1.21 which appears within her baseline of  1.2-1.3. Daily  BMP  Anxiety Continue Xanax 0.5 mg 3 times daily as needed and fluoxetine 20 mg twice daily  GERD Home meds include omeprazole 20 mg daily    DVT prophylaxis: Eliquis Code Status: Full Family Communication: Discussed extensively with daughter on 02/02/20   Status is: Inpatient  Remains inpatient appropriate because:Hemodynamically unstable, Ongoing diagnostic testing needed not appropriate for outpatient work up, IV treatments appropriate due to intensity of illness or inability to take PO and Inpatient level of care appropriate due to severity of illness   Dispo: The patient is from: Home              Anticipated d/c is to: SNF              Anticipated d/c date is: 2 days              Patient currently is not medically stable to d/c. Vascular sign off, IVF   Consultants:   Vascular Surgery  Cardiology  Discussed with urology  Procedures:  B popliteal exploration and embolectomy on 6/3 Emergent 4 compartment fasciotomy 6/4  Antimicrobials: Anti-infectives (From admission, onward)   Start     Dose/Rate Route Frequency Ordered Stop   01/30/20 1630  amoxicillin (AMOXIL) capsule 500 mg     Discontinue     500 mg Oral Every 8 hours 01/30/20 1619     01/30/20 1630  fluconazole (DIFLUCAN) tablet 150 mg        150 mg Oral  Once 01/30/20 1619 01/30/20 1733   01/28/20 1530  cefTRIAXone (ROCEPHIN) 1 g in sodium chloride 0.9 % 100 mL IVPB  Status:  Discontinued       Note to Pharmacy: Allergy list reviewed Per checklist, should be OK for cephalosporin   1 g 200 mL/hr over 30 Minutes Intravenous Every 24 hours 01/28/20 1358 01/30/20 1619   01/26/20 0918  ceFAZolin (ANCEF) 2-4 GM/100ML-% IVPB       Note to Pharmacy: Evern Bio   : cabinet override      01/26/20 0918 01/26/20 2129       Subjective: Patient reports feeling better, denies any new complaints.  Noted to be orthostatic hypotensive during PT eval    Objective: Vitals:   02/06/20 0706 02/06/20 0709  02/06/20 0856 02/06/20 1135  BP: (!) 165/64  (!) 149/62 (!) 157/55  Pulse:  65 65 61  Resp:  14 18 15   Temp:  99.2 F (37.3 C) 99 F (37.2 C) 98.8 F (37.1 C)  TempSrc:  Oral Oral Oral  SpO2:  100% 100% 93%  Weight:      Height:        Intake/Output Summary (Last 24 hours) at 02/06/2020 1620 Last data filed at 02/06/2020 1500 Gross per 24 hour  Intake 120 ml  Output 950 ml  Net -830 ml   Filed Weights   01/24/20 2115 01/29/20 1046 02/02/20 0314  Weight: 48.5 kg 48.1 kg 50.2 kg    Examination:  General: NAD, frail, oriented  Cardiovascular: S1, S2 present  Respiratory: CTAB  Abdomen: Soft, nontender, nondistended, bowel sounds present  Musculoskeletal: Noted 4 incision in b/l thigh with staples, c/d/i, LLE 1+ edema noted, L foot drop  Skin: As mentioned above  Psychiatry: Normal mood    Data Reviewed: I have personally reviewed following labs and imaging studies  CBC: Recent Labs  Lab 02/03/20 0417 02/03/20 1651 02/04/20 0348 02/05/20 0435 02/06/20 0213  WBC 7.6 7.5 8.7 8.1 7.9  HGB 8.1*  8.2* 7.7* 8.6* 8.6*  HCT 25.8* 26.3* 24.9* 27.4* 27.6*  MCV 89.0 89.2 89.2 89.5 90.8  PLT 324 346 331 383 374   Basic Metabolic Panel: Recent Labs  Lab 01/31/20 0012 02/01/20 0318 02/02/20 0349 02/03/20 0417 02/04/20 0348  NA 139 136 137 138 135  K 4.6 3.4* 4.1 4.4 3.5  CL 102 104 103 101 98  CO2 25 25 25 27 27   GLUCOSE 128* 124* 112* 100* 92  BUN 18 19 17 12 9   CREATININE 1.06* 0.94 0.94 0.96 0.97  CALCIUM 8.8* 8.6* 8.7* 8.8* 8.6*  MG 1.4* 1.8  --   --   --    GFR: Estimated Creatinine Clearance: 37.9 mL/min (by C-G formula based on SCr of 0.97 mg/dL). Liver Function Tests: Recent Labs  Lab 01/31/20 0012  AST 17  ALT 12  ALKPHOS 46  BILITOT 0.7  PROT 5.6*  ALBUMIN 2.4*   No results for input(s): LIPASE, AMYLASE in the last 168 hours. No results for input(s): AMMONIA in the last 168 hours. Coagulation Profile: No results for input(s): INR,  PROTIME in the last 168 hours. Cardiac Enzymes: No results for input(s): CKTOTAL, CKMB, CKMBINDEX, TROPONINI in the last 168 hours. BNP (last 3 results) No results for input(s): PROBNP in the last 8760 hours. HbA1C: No results for input(s): HGBA1C in the last 72 hours. CBG: No results for input(s): GLUCAP in the last 168 hours. Lipid Profile: No results for input(s): CHOL, HDL, LDLCALC, TRIG, CHOLHDL, LDLDIRECT in the last 72 hours. Thyroid Function Tests: No results for input(s): TSH, T4TOTAL, FREET4, T3FREE, THYROIDAB in the last 72 hours. Anemia Panel: No results for input(s): VITAMINB12, FOLATE, FERRITIN, TIBC, IRON, RETICCTPCT in the last 72 hours. Sepsis Labs: No results for input(s): PROCALCITON, LATICACIDVEN in the last 168 hours.  Recent Results (from the past 240 hour(s))  Culture, Urine     Status: Abnormal   Collection Time: 01/28/20  1:57 PM   Specimen: Urine, Random  Result Value Ref Range Status   Specimen Description URINE, RANDOM  Final   Special Requests   Final    NONE Performed at [facility], [address], [place], [address] [place]    Culture 20,000 COLONIES/mL ENTEROCOCCUS FAECALIS (A)  Final   Report Status 01/30/2020 FINAL  Final   Organism ID, Bacteria ENTEROCOCCUS FAECALIS (A)  Final      Susceptibility   Enterococcus faecalis - MIC*    AMPICILLIN <=2 SENSITIVE Sensitive     NITROFURANTOIN <=16 SENSITIVE Sensitive     VANCOMYCIN 1 SENSITIVE Sensitive     * 20,000 COLONIES/mL ENTEROCOCCUS FAECALIS     Radiology Studies: No results found.  Scheduled Meds: . amoxicillin  500 mg Oral Q8H  . apixaban  5 mg Oral BID  . Chlorhexidine Gluconate Cloth  6 each Topical Daily  . feeding supplement (ENSURE ENLIVE)  237 mL Oral BID BM  . flecainide  50 mg Oral BID  . FLUoxetine  20 mg Oral BID  . metoprolol succinate  25 mg Oral QHS  . multivitamin with minerals  1 tablet Oral Daily  . pantoprazole  40 mg Oral Daily  . sodium chloride flush  3  mL Intravenous Q12H  . vitamin B-12  1,000 mcg Oral Daily   Continuous Infusions:    LOS: 12 days   [id number], MD [facility] Pager On Amion  If 7PM-7AM, please contact night-coverage 02/06/2020, 4:20 PM

## 2020-02-06 NOTE — Progress Notes (Signed)
Occupational Therapy Treatment Patient Details Name: Barbara Hill MRN: [id number] DOB: 1941-01-21 Today's Date: 02/06/2020    History of present illness Patient is a 79 y/o female who presented with acute ischemic lower extremities now s/p bilateral popliteal exploration and embolectomy 01/25/20. 24 hours later developed compartment syndrome and went for bilateral 4 compartment fasciotomy 01/26/20.  Has some post op confusion.  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   OT comments  Patient progressing towards acute OT goals. Both patient and DTR note improved cognition, patient still with some short term memory and problem solving deficits requiring cues for body mechanics when scooting EOB and in preparation for stand pivot to recliner. Will continue to follow.  BP semi-supine 130/58 BP EOB 116/56 BP in chair (unable to tolerate standing BP) 116/64   Follow Up Recommendations  CIR    Equipment Recommendations  Other (comment) (TBD)       Precautions / Restrictions Precautions Precautions: Fall Precaution Comments: watch BP       Mobility Bed Mobility Overal bed mobility: Needs Assistance Bed Mobility: Supine to Sit     Supine to sit: Min assist;HOB elevated     General bed mobility comments: for trunk management  Transfers Overall transfer level: Needs assistance Equipment used: None Transfers: Sit to/from [employer] Sit to Stand: Mod assist Stand pivot transfers: Mod assist       General transfer comment: mod A for balance, verbal cues for hand placement on OT during stand pivot    Balance Overall balance assessment: Needs assistance Sitting-balance support: Feet supported;Single extremity supported Sitting balance-Leahy Scale: Fair Sitting balance - Comments: Close supervision to min guard for balance.   Standing balance support: Bilateral upper extremity supported Standing  balance-Leahy Scale: Poor Standing balance comment: decreased standing balance requires support from OT to perform stand pivot                           ADL either performed or assessed with clinical judgement   ADL Overall ADL's : Needs assistance/impaired                     Lower Body Dressing: Moderate assistance;Sitting/lateral leans Lower Body Dressing Details (indicate cue type and reason): patient able to pull up sock on L foot seated EOB, required assist with R Toilet Transfer: Moderate assistance;Stand-pivot Toilet Transfer Details (indicate cue type and reason): to recliner, cues for body mechanics         Functional mobility during ADLs: Moderate assistance;Cueing for safety;Cueing for sequencing                 Cognition Arousal/Alertness: Awake/alert Behavior During Therapy: WFL for tasks assessed/performed Overall Cognitive Status: Impaired/Different from baseline Area of Impairment: Problem solving;Memory                     Memory: Decreased short-term memory       Problem Solving: Requires verbal cues          Exercises General Exercises - Lower Extremity Ankle Circles/Pumps: AROM;Both;20 reps;Supine Heel Slides: AROM;Both;10 reps;AAROM;Supine Straight Leg Raises: AROM;Both;10 reps;Supine           Pertinent Vitals/ Pain       Pain Assessment: Faces Faces Pain Scale: Hurts little more Pain Location: B LEs Pain Descriptors / Indicators: Sore;Discomfort Pain Intervention(s): Monitored during session  Frequency  Min 2X/week        Progress Toward Goals  OT Goals(current goals can now be found in the care plan section)  Progress towards OT goals: Progressing toward goals  Acute Rehab OT Goals Patient Stated Goal: to go home OT Goal Formulation: With patient/family Time For Goal Achievement: 02/11/20 Potential to Achieve Goals: Good ADL Goals Pt Will Perform Grooming: with set-up;sitting Pt  Will Perform Lower Body Dressing: with min assist;sitting/lateral leans;sit to/from stand Pt Will Transfer to Toilet: with min assist;stand pivot transfer;bedside commode Pt Will Perform Toileting - Clothing Manipulation and hygiene: sitting/lateral leans;sit to/from stand;with min guard assist Additional ADL Goal #1: Pt will consistently follow 1-2 step commands with ADL/mobility tasks in order to increase awareness and increase independence.  Plan Discharge plan remains appropriate       AM-PAC OT "6 Clicks" Daily Activity     Outcome Measure   Help from another person eating meals?: A Little Help from another person taking care of personal grooming?: A Little Help from another person toileting, which includes using toliet, bedpan, or urinal?: A Lot Help from another person bathing (including washing, rinsing, drying)?: A Lot Help from another person to put on and taking off regular upper body clothing?: A Little Help from another person to put on and taking off regular lower body clothing?: A Lot 6 Click Score: 15    End of Session  OT Visit Diagnosis: Unsteadiness on feet (R26.81);Muscle weakness (generalized) (M62.81);Pain;Other symptoms and signs involving cognitive function Pain - Right/Left:  (bilateral) Pain - part of body: Leg;Ankle and joints of foot   Activity Tolerance Patient tolerated treatment well   Patient Left in chair;with call bell/phone within reach;with chair alarm set   Nurse Communication Mobility status        Time: 0165-5374 OT Time Calculation (min): 24 min  Charges: OT General Charges $OT Visit: 1 Visit OT Treatments $Self Care/Home Management : 23-37 mins  Delbert Phenix OT OT office: [place] 02/06/2020, 3:08 PM

## 2020-02-06 NOTE — Progress Notes (Signed)
Inpatient Rehabilitation Admissions Coordinator  Noted improvement with therapy, but remains not at a level to pursue more intensive inpt rehab. Would recommend SNF.  Ottie Glazier, RN, MSN Rehab Admissions Coordinator [phone] 02/06/2020 1:43 PM

## 2020-02-06 NOTE — Progress Notes (Signed)
Patient ID: Barbara Hill, female   DOB: May 07, 1941, 79 y.o.   MRN: [id number]  Progress Note    02/06/2020 7:53 AM 8 Days Post-Op  Subjective: Comfortable this morning less soreness in her left calf.   Vitals:   02/06/20 0706 02/06/20 0709  BP: (!) 165/64   Pulse:  65  Resp:  14  Temp:  99.2 F (37.3 C)  SpO2:  100%   Physical Exam: 2+ right dorsalis pedis and 2+ left posterior tibial pulse.  Bruising resolving with and softness in her left calf.  CBC    Component Value Date/Time   WBC 7.9 02/06/2020 0213   RBC 3.04 (L) 02/06/2020 0213   HGB 8.6 (L) 02/06/2020 0213   HGB 10.6 (L) 04/10/2019 1047   HCT 27.6 (L) 02/06/2020 0213   HCT 31.9 (L) 04/10/2019 1047   PLT 374 02/06/2020 0213   PLT 308 04/10/2019 1047   MCV 90.8 02/06/2020 0213   MCV 91 04/10/2019 1047   MCV 92 10/23/2013 0803   MCH 28.3 02/06/2020 0213   MCHC 31.2 02/06/2020 0213   RDW 17.8 (H) 02/06/2020 0213   RDW 11.3 (L) 04/10/2019 1047   RDW 14.0 10/23/2013 0803   LYMPHSABS 2.6 01/24/2020 2200   LYMPHSABS 2.4 10/23/2013 0803   MONOABS 0.7 01/24/2020 2200   MONOABS 0.7 10/23/2013 0803   EOSABS 0.3 01/24/2020 2200   EOSABS 0.3 10/23/2013 0803   BASOSABS 0.0 01/24/2020 2200   BASOSABS 0.1 10/23/2013 0803    BMET    Component Value Date/Time   NA 135 02/04/2020 0348   NA 139 04/10/2019 1047   NA 140 10/23/2013 0803   K 3.5 02/04/2020 0348   K 4.6 10/23/2013 0803   CL 98 02/04/2020 0348   CL 107 10/23/2013 0803   CO2 27 02/04/2020 0348   CO2 28 10/23/2013 0803   GLUCOSE 92 02/04/2020 0348   GLUCOSE 87 10/23/2013 0803   BUN 9 02/04/2020 0348   BUN 19 04/10/2019 1047   BUN 6 (L) 10/23/2013 0803   CREATININE 0.97 02/04/2020 0348   CREATININE 0.66 07/24/2016 1125   CALCIUM 8.6 (L) 02/04/2020 0348   CALCIUM 8.8 10/23/2013 0803   GFRNONAA 56 (L) 02/04/2020 0348   GFRNONAA >60 10/23/2013 0803   GFRAA >60 02/04/2020 0348   GFRAA >60 10/23/2013 0803    INR    Component Value Date/Time    INR 1.3 (H) 01/29/2020 0835   INR 1.1 10/22/2013 0125     Intake/Output Summary (Last 24 hours) at 02/06/2020 0753 Last data filed at 02/05/2020 1540 Gross per 24 hour  Intake 240 ml  Output 200 ml  Net 40 ml     Assessment/Plan:  79 y.o. female continues to progress.  Main issue is mobility.  Should be okay for discharge when comfortable walking.  Her hope is for discharge to home.  Is now off heparin on and on oral anticoagulation.  Is 11 days today status post fasciotomy.  Can have her staples removed tomorrow.     [name], MD FACS [facility] [phone] 02/06/2020 7:53 AM

## 2020-02-06 NOTE — Progress Notes (Signed)
OT Cancellation Note  Patient Details Name: NECHAMA ESCUTIA MRN: [id number] DOB: 08-Jan-1941   Cancelled Treatment:    Reason Eval/Treat Not Completed: Other (comment) Patient had just finished with PT (around 1000) and PT stating drop in BP and should try back later to let patient recover. Will re-attempt as schedule permits.  Marlyce Huge OT OT office: [phone]   Carmelia Roller 02/06/2020, 11:27 AM

## 2020-02-06 NOTE — Progress Notes (Signed)
Physical Therapy Treatment Patient Details Name: Barbara Hill MRN: [id number] DOB: 1941/07/03 Today's Date: 02/06/2020    History of Present Illness Patient is a 79 y/o female who presented with acute ischemic lower extremities now s/p bilateral popliteal exploration and embolectomy 01/25/20. 24 hours later developed compartment syndrome and went for bilateral 4 compartment fasciotomy 01/26/20.  Has some post op confusion.  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    PT Comments    Pt motivated to participate in PT this day, is much more cognitively intact vs previous PT visits. Pt requires min-mod assist for bed mobility and transfer to stand x3 this day. Mobility not progressed from EOB due to  L foot pain, + orthostatics (see below, pt asymptomatic) and poor standing balance. Pt states her ultimate goal is to d/c home when she can walk, this PT feels CIR remains appropriate d/c plan to maximize functional recovery. PT to continue to follow acutely.   BP supine: 149/62(85) BP sitting: 102/71(79) BP standing: 95/49(63)  BP return to supine: 136/56(78)   Follow Up Recommendations  CIR;Supervision/Assistance - 24 hour     Equipment Recommendations  Rolling walker with 5" wheels    Recommendations for Other Services       Precautions / Restrictions Precautions Precautions: Fall Precaution Comments: watch BP Restrictions Weight Bearing Restrictions: No RLE Weight Bearing: Weight bearing as tolerated LLE Weight Bearing: Weight bearing as tolerated    Mobility  Bed Mobility Overal bed mobility: Needs Assistance Bed Mobility: Supine to Sit;Sit to Supine     Supine to sit: Min assist;HOB elevated Sit to supine: Mod assist;HOB elevated   General bed mobility comments: Min-mod assist for supine<>sit for trunk and LE management, scootign to EOB with use of bed pad as pt fearful of falling when moving closer to  edge.  Transfers Overall transfer level: Needs assistance Equipment used: Rolling walker (2 wheeled) Transfers: Lateral/Scoot Transfers;Sit to/from Stand Sit to Stand: Mod assist;From elevated surface        Lateral/Scoot Transfers: Min guard General transfer comment: Mod assist elevated bed height for initiating power up with repetitive rocking, hip extension via posterior assist, and steadying upon standing. Sit to stand x3, with emphasis on coming to full upright. Verbal cuing for anterior leaning as pt with posterior leaning upon standing, standing in center of CoM although limited by L foot pain. close guard for scooting to [facility] in preparation of laying down.  Ambulation/Gait             General Gait Details: Unable - orthostatic hypotension, unable to place weight through LLE for stand pivot   Stairs             Wheelchair Mobility    Modified Rankin (Stroke Patients Only)       Balance Overall balance assessment: Needs assistance Sitting-balance support: Feet supported;Feet unsupported;Single extremity supported Sitting balance-Leahy Scale: Fair     Standing balance support: Bilateral upper extremity supported Standing balance-Leahy Scale: Poor Standing balance comment: posterior leaning, significant physical assist and external support needed                            Cognition Arousal/Alertness: Awake/alert Behavior During Therapy: Anxious Overall Cognitive Status: Impaired/Different from baseline Area of Impairment: Attention;Memory                   Current Attention Level:  Selective Memory: Decreased short-term memory Following Commands: Follows one step commands with increased time     Problem Solving: Difficulty sequencing;Requires tactile cues;Requires verbal cues General Comments: Pt A&Ox4, responds appropriately to PT questions/statements and recalls being very confused over the last PT session. Pt requires multimodal  and repeated cuing, STM deficits noted as pt asks "you're with what part of rehab?" multiple times      Exercises General Exercises - Lower Extremity Ankle Circles/Pumps: AROM;Both;20 reps;Supine Heel Slides: AROM;Both;10 reps;AAROM;Supine Straight Leg Raises: AROM;Both;10 reps;Supine    General Comments        Pertinent Vitals/Pain Pain Assessment: 0-10 Pain Score: 8  Pain Location: Bilateral LEs, L>R Pain Descriptors / Indicators: Sore;Discomfort;Grimacing;Guarding Pain Intervention(s): Limited activity within patient's tolerance;Monitored during session;Repositioned    Home Living                      Prior Function            PT Goals (current goals can now be found in the care plan section) Acute Rehab PT Goals Patient Stated Goal: to go home PT Goal Formulation: With patient/family Time For Goal Achievement: 02/10/20 Potential to Achieve Goals: Good Progress towards PT goals: Progressing toward goals    Frequency    Min 3X/week      PT Plan Current plan remains appropriate    Co-evaluation              AM-PAC PT "6 Clicks" Mobility   Outcome Measure  Help needed turning from your back to your side while in a flat bed without using bedrails?: A Little Help needed moving from lying on your back to sitting on the side of a flat bed without using bedrails?: A Lot Help needed moving to and from a bed to a chair (including a wheelchair)?: A Lot Help needed standing up from a chair using your arms (e.g., wheelchair or bedside chair)?: A Lot Help needed to walk in hospital room?: Total Help needed climbing 3-5 steps with a railing? : Total 6 Click Score: 11    End of Session Equipment Utilized During Treatment: Gait belt Activity Tolerance: Patient limited by pain;Other (comment) (orthostatic hypotension) Patient left: with call bell/phone within reach;in bed;with bed alarm set   PT Visit Diagnosis: Other abnormalities of gait and mobility  (R26.89);Muscle weakness (generalized) (M62.81);Difficulty in walking, not elsewhere classified (R26.2);Pain Pain - Right/Left: Left Pain - part of body: Leg     Time: 3818-2993 PT Time Calculation (min) (ACUTE ONLY): 30 min  Charges:  $Therapeutic Exercise: 8-22 mins $Therapeutic Activity: 8-22 mins                     Ashlie Mcmenamy E, PT Acute Rehabilitation Services Pager [phone]  Office [phone]   Cheyla Duchemin D Despina Hidden 02/06/2020, 12:28 PM

## 2020-02-06 NOTE — NC FL2 (Signed)
[place] MEDICAID FL2 LEVEL OF CARE SCREENING TOOL     IDENTIFICATION  Patient Name: Barbara Hill Birthdate: [birth year] Sex: female Admission Date (Current Location): 01/24/2020  [facility] and [place] Number:  Producer, television/film/video and Address:  [facility]. [facility], [address] [place], [place] [address]      Provider Number: [id number]  Attending Physician Name and Address:  Briant Cedar, MD  Relative Name and Phone Number:       Current Level of Care: Hospital Recommended Level of Care: Skilled Nursing Facility Prior Approval Number:    Date Approved/Denied:   PASRR Number:    Discharge Plan: SNF    Current Diagnoses: Patient Active Problem List   Diagnosis Date Noted  . Protein-calorie malnutrition, severe 02/05/2020  . Ischemic leg 01/25/2020  . COPD (chronic obstructive pulmonary disease) (HCC) 01/25/2020  . Normocytic anemia 01/25/2020  . History of CVA (cerebrovascular accident) 01/25/2020  . Anxiety 01/25/2020  . CKD (chronic kidney disease), stage III 01/25/2020  . 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 Brain aneurysm 06/29/2016  . Lumbar contusion   . Orthostatic hypotension   . First degree AV block   . Hypokalemia   . Syncope 04/10/2016  . Paroxysmal SVT (supraventricular tachycardia) (HCC) 04/21/2011  . 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 Tobacco use disorder 04/21/2011    Orientation RESPIRATION BLADDER Height & Weight     Self, Time, Situation, Place  Normal External catheter Weight: 50.2 kg Height:  5\' 3"  (160 cm)  BEHAVIORAL SYMPTOMS/MOOD NEUROLOGICAL BOWEL NUTRITION STATUS      Continent Diet  AMBULATORY STATUS COMMUNICATION OF NEEDS Skin   Extensive Assist Verbally Surgical wounds                       Personal Care Assistance Level of Assistance  Bathing, Dressing Bathing Assistance: Limited assistance   Dressing Assistance: Limited assistance     Functional Limitations Info              SPECIAL CARE FACTORS FREQUENCY  PT (By licensed PT), OT (By licensed OT)     PT Frequency: 5x week OT Frequency: 5x week            Contractures Contractures Info: Not present    Additional Factors Info  Allergies   Allergies Info: see H&P           Current Medications (02/06/2020):  This is the current hospital active medication list Current Facility-Administered Medications  Medication Dose Route Frequency Provider Last Rate Last Admin  . acetaminophen (TYLENOL) tablet 650 mg  650 mg Oral Q6H PRN 02/08/2020, PA-C   650 mg at 02/05/20 1244   Or  . acetaminophen (TYLENOL) suppository 650 mg  650 mg Rectal Q6H PRN Rhyne, 02/07/20, PA-C      . ALPRAZolam [place] [facility]) tablet 0.5 mg  0.5 mg Oral TID PRN [name] J, PA-C   0.5 mg at 02/06/20 0007  . amoxicillin (AMOXIL) capsule 500 mg  500 mg Oral Q8H 02/08/20, MD   500 mg at 02/06/20 [phone]  . apixaban (ELIQUIS) tablet 5 mg  5 mg Oral BID 3500, MD   5 mg at 02/06/20 0859  . butalbital-acetaminophen-caffeine (FIORICET) 50-325-40 MG per tablet 1 tablet  1 tablet Oral Q6H PRN 02/08/20, MD   1 tablet at 02/06/20 0900  . Chlorhexidine Gluconate Cloth 2 % PADS 6 each  6  each Topical Daily Gabriel Earing, PA-C   6 each at 02/05/20 0910  . feeding supplement (ENSURE ENLIVE) (ENSURE ENLIVE) liquid 237 mL  237 mL Oral BID BM Alma Friendly, MD   237 mL at 02/05/20 1650  . flecainide (TAMBOCOR) tablet 50 mg  50 mg Oral BID Rhyne, Samantha J, PA-C   50 mg at 02/06/20 0900  . FLUoxetine (PROZAC) capsule 20 mg  20 mg Oral BID Rhyne, Samantha J, PA-C   20 mg at 02/06/20 0900  . guaiFENesin-dextromethorphan (ROBITUSSIN DM) 100-10 MG/5ML syrup 5 mL  5 mL Oral Q4H PRN Alma Friendly, MD   5 mL at 02/06/20 0906  . hydrALAZINE (APRESOLINE) tablet 25 mg  25 mg Oral Q8H PRN Alma Friendly, MD   25 mg at 02/04/20 1651  . metoprolol succinate (TOPROL-XL) 24 hr tablet 25 mg  25 mg  Oral QHS Rhyne, Samantha J, PA-C   25 mg at 02/05/20 2147  . morphine 2 MG/ML injection 2 mg  2 mg Intravenous Q2H PRN Rhyne, Samantha J, PA-C   2 mg at 01/30/20 1200  . multivitamin with minerals tablet 1 tablet  1 tablet Oral Daily Alma Friendly, MD   1 tablet at 02/06/20 0900  . ondansetron (ZOFRAN) tablet 4 mg  4 mg Oral Q6H PRN Rhyne, Samantha J, PA-C   4 mg at 02/02/20 0325   Or  . ondansetron (ZOFRAN) injection 4 mg  4 mg Intravenous Q6H PRN Rhyne, Samantha J, PA-C   4 mg at 02/05/20 0859  . oxyCODONE (Oxy IR/ROXICODONE) immediate release tablet 5 mg  5 mg Oral Q4H PRN Rhyne, Samantha J, PA-C   5 mg at 02/05/20 0552  . pantoprazole (PROTONIX) EC tablet 40 mg  40 mg Oral Daily Rhyne, Samantha J, PA-C   40 mg at 02/06/20 0900  . sodium chloride flush (NS) 0.9 % injection 3 mL  3 mL Intravenous Q12H Rhyne, Samantha J, PA-C   3 mL at 02/06/20 0901  . vitamin B-12 (CYANOCOBALAMIN) tablet 1,000 mcg  1,000 mcg Oral Daily Alma Friendly, MD   1,000 mcg at 02/06/20 7741     Discharge Medications: Please see discharge summary for a list of discharge medications.  Relevant Imaging Results:  Relevant Lab Results:   Additional Information SSN#- [id number]  Dawayne Patricia, RN

## 2020-02-07 LAB — CBC
HCT: 30 % — ABNORMAL LOW (ref 36.0–46.0)
Hemoglobin: 9.2 g/dL — ABNORMAL LOW (ref 12.0–15.0)
MCH: 28.3 pg (ref 26.0–34.0)
MCHC: 30.7 g/dL (ref 30.0–36.0)
MCV: 92.3 fL (ref 80.0–100.0)
Platelets: 401 10*3/uL — ABNORMAL HIGH (ref 150–400)
RBC: 3.25 MIL/uL — ABNORMAL LOW (ref 3.87–5.11)
RDW: 18.3 % — ABNORMAL HIGH (ref 11.5–15.5)
WBC: 6.7 10*3/uL (ref 4.0–10.5)
nRBC: 0 % (ref 0.0–0.2)

## 2020-02-07 LAB — BASIC METABOLIC PANEL
Anion gap: 8 (ref 5–15)
BUN: 7 mg/dL — ABNORMAL LOW (ref 8–23)
CO2: 27 mmol/L (ref 22–32)
Calcium: 8.5 mg/dL — ABNORMAL LOW (ref 8.9–10.3)
Chloride: 101 mmol/L (ref 98–111)
Creatinine, Ser: 0.99 mg/dL (ref 0.44–1.00)
GFR calc Af Amer: >60 mL/min (ref >60)
GFR calc non Af Amer: 55 mL/min — ABNORMAL LOW (ref >60)
Glucose, Bld: 91 mg/dL (ref 70–99)
Potassium: 3.4 mmol/L — ABNORMAL LOW (ref 3.5–5.1)
Sodium: 136 mmol/L (ref 135–145)

## 2020-02-07 MED ORDER — POTASSIUM CHLORIDE CRYS ER 20 MEQ PO TBCR
40.0000 meq | EXTENDED_RELEASE_TABLET | ORAL | Status: AC
  Administered 2020-02-07 (×2): 40 meq via ORAL
  Filled 2020-02-07 (×2): qty 2

## 2020-02-07 NOTE — Progress Notes (Signed)
PROGRESS NOTE  Barbara Hill:[id number] DOB: Nov 22, 1940   PCP: Richmond Campbell., PA-C  Patient is from: Home.  DOA: 01/24/2020 LOS: 13  Brief Narrative / Interim history: 79 year old female with history of PSVT, COPD, stented right MCA aneurysm complicated by CVA, ongoing tobacco smoke, HTN and anemia presenting with acute right leg pain and found to have bilateral popliteal veins on CTA.  She underwent bilateral popliteal exploration and embolectomy by Dr. Arbie Cookey on 01/25/2020.  She then underwent bilateral fasciotomy on 01/26/2020. TEE negative for cardiac thrombus.  She is currently doing well.  Waiting on SNF placement.  Subjective: Seen and examined earlier this morning.  No major events overnight of this morning.  She had left lower extremity pain earlier in the morning that has resolved.  Denies chest pain, dyspnea, GI or UTI symptoms.  Had a bowel movement yesterday.  Reports some intermittent cough.  Objective: Vitals:   02/06/20 2314 02/07/20 0500 02/07/20 0812 02/07/20 1100  BP: (!) 161/59  (!) 132/54   Pulse: 60 60 65 65  Resp: 17 15 16    Temp: 97.7 F (36.5 C) 98 F (36.7 C) 98.5 F (36.9 C) 98.4 F (36.9 C)  TempSrc: Oral Oral Oral Oral  SpO2: 97% 96%  97%  Weight:      Height:        Intake/Output Summary (Last 24 hours) at 02/07/2020 1208 Last data filed at 02/07/2020 1100 Gross per 24 hour  Intake 880 ml  Output 1050 ml  Net -170 ml   Filed Weights   01/24/20 2115 01/29/20 1046 02/02/20 0314  Weight: 48.5 kg 48.1 kg 50.2 kg    Examination:  GENERAL: Frail elderly female.  No apparent distress.  Nontoxic. HEENT: MMM.  Vision and hearing grossly intact.  NECK: Supple.  No apparent JVD.  RESP: On room air.  No IWOB.  Fair aeration bilaterally. CVS:  RRR. Heart sounds normal.  ABD/GI/GU: BS+. Abd soft, NTND.  MSK/EXT:  Moves extremities.  Right DP and left PT pulses palpable.  Cap refills brisk. SKIN: Fasciotomy wounds on either side of both legs  appears clean and dry. NEURO: Awake, alert and oriented appropriately.  No apparent focal neuro deficit. PSYCH: Calm. Normal affect.  Procedures:  6/3-bilateral popliteal exploration and embolectomy.  S 6/4-bilateral fasciotomy on 01/26/2020 6/7-TEE without cardiac thrombus or PFO.  Microbiology summarized: 6/3-COVID-19 PCR negative. 6/6-urine culture with 20,000 colonies of Enterococcus faecalis.  Assessment & Plan: Multiple ischemic legs in the setting of bilateral popliteal embolus Bilateral lower extremity compartment syndrome -No clear source of embolus.  TEE negative for PFO or thrombus. -Bilateral popliteal exploration and embolectomy on 6/3 by Dr. 8/3 -Bilateral fasciotomies on 6/4 by Dr. 8/4 Arbie Cookey removed on 6/16 -Continue Eliquis anticoagulation -Good right DP and left PT pulses with brisk cap refills in toes -Encouraged tobacco cessation.  Orthostatic hypotension: remains orthostatic with SBP dropping from 144-108 from lying to standing but not check at 3 minutes -We will repeat orthostatic vitals to check 3-minute standing BP -Check orthostatic vitals daily. -We may have to hold antihypertensive medications or treated standing medications.  Paroxysmal SVT: Stable -Continue metoprolol and flecainide  Hypokalemia/hypomagnesemia -Monitor and replace as appropriate  Chronic COPD/tobacco use-coughing intermittently but no respiratory distress. -Continue breathing treatments -Encourage tobacco cessation   Acute metabolic encephalopathy-multifactorial including UTI.  Resolved. -Treat treatable course -Reorientation and delirium precautions  Enterococcus UTI with hematuria: Resolved. -Completed antibiotic course.   ABLA: Likely from surgical loss.  Baseline Hgb~10> 10 (admit)>  6.43u>>>> 9.2.  Iron sat 6%.  TIBC 246.  Ferritin 97.  FOBT negative.  Large blood on UA. -Continue monitoring  Hematuria in the setting of UTI-felt to be due to UTI.  Renal  ultrasound negative. -Urology recommended outpatient follow-up for hematuria work-up after discharge.  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  No focal neuro deficits -She was on Plavix prior to admission which is discontinued.  Question about indication at this time.  She is also on Eliquis which would increase the risk of bleeding.  Chronic kidney disease stage IIIA: Baseline 1.2-1.3> 0.99.  Stable. -Continue monitoring  Anxiety: Stable -Continue home Prozac and as needed Xanax  GERD -Continue PPI  Severe malnutrition: Significant muscle mass and subcu fat loss. Body mass index is 19.6 kg/m. Nutrition Problem: Severe Malnutrition Etiology: chronic illness (COPD) Signs/Symptoms: severe fat depletion, severe muscle depletion Interventions: Ensure Enlive (each supplement provides 350kcal and 20 grams of protein), MVI   DVT prophylaxis:   apixaban (ELIQUIS) tablet 5 mg  Code Status: Full code Family Communication: Patient and/or RN. Available if any question.  Status is: Inpatient  Remains inpatient appropriate because:Hemodynamically unstable, Unsafe d/c plan and Inpatient level of care appropriate due to severity of illness   Dispo: The patient is from: Home              Anticipated d/c is to: SNF              Anticipated d/c date is: 2 days              Patient currently is not medically stable to d/c.       Consultants:  Vascular surgery Urology over the phone   Sch Meds:  Scheduled Meds: . apixaban  5 mg Oral BID  . Chlorhexidine Gluconate Cloth  6 each Topical Daily  . feeding supplement (ENSURE ENLIVE)  237 mL Oral BID BM  . flecainide  50 mg Oral BID  . FLUoxetine  20 mg Oral BID  . metoprolol succinate  25 mg Oral QHS  . multivitamin with minerals  1 tablet Oral Daily  . pantoprazole  40 mg Oral Daily  . potassium chloride  40 mEq Oral Q4H  . sodium chloride flush  3 mL Intravenous Q12H  . vitamin B-12   1,000 mcg Oral Daily   Continuous Infusions: PRN Meds:.acetaminophen **OR** acetaminophen, ALPRAZolam, butalbital-acetaminophen-caffeine, guaiFENesin-dextromethorphan, hydrALAZINE, ondansetron **OR** ondansetron (ZOFRAN) IV, oxyCODONE  Antimicrobials: Anti-infectives (From admission, onward)   Start     Dose/Rate Route Frequency Ordered Stop   01/30/20 1630  amoxicillin (AMOXIL) capsule 500 mg  Status:  Discontinued        500 mg Oral Every 8 hours 01/30/20 1619 02/06/20 1623   01/30/20 1630  fluconazole (DIFLUCAN) tablet 150 mg        150 mg Oral  Once 01/30/20 1619 01/30/20 1733   01/28/20 1530  cefTRIAXone (ROCEPHIN) 1 g in sodium chloride 0.9 % 100 mL IVPB  Status:  Discontinued       Note to Pharmacy: Allergy list reviewed Per checklist, should be OK for cephalosporin   1 g 200 mL/hr over 30 Minutes Intravenous Every 24 hours 01/28/20 1358 01/30/20 1619   01/26/20 0918  ceFAZolin (ANCEF) 2-4 GM/100ML-% IVPB       Note to Pharmacy: Jasmine Pang   : cabinet override      01/26/20 0918 01/26/20 2129       I have personally reviewed  the following labs and images: CBC: Recent Labs  Lab 02/03/20 1651 02/04/20 0348 02/05/20 0435 02/06/20 0213 02/07/20 0323  WBC 7.5 8.7 8.1 7.9 6.7  HGB 8.2* 7.7* 8.6* 8.6* 9.2*  HCT 26.3* 24.9* 27.4* 27.6* 30.0*  MCV 89.2 89.2 89.5 90.8 92.3  PLT 346 331 383 374 401*   BMP &GFR Recent Labs  Lab 02/01/20 0318 02/02/20 0349 02/03/20 0417 02/04/20 0348 02/07/20 0323  NA 136 137 138 135 136  K 3.4* 4.1 4.4 3.5 3.4*  CL 104 103 101 98 101  CO2 25 25 27 27 27   GLUCOSE 124* 112* 100* 92 91  BUN 19 17 12 9  7*  CREATININE 0.94 0.94 0.96 0.97 0.99  CALCIUM 8.6* 8.7* 8.8* 8.6* 8.5*  MG 1.8  --   --   --   --    Estimated Creatinine Clearance: 37.1 mL/min (by C-G formula based on SCr of 0.99 mg/dL). Liver & Pancreas: No results for input(s): AST, ALT, ALKPHOS, BILITOT, PROT, ALBUMIN in the last 168 hours. No results for input(s):  LIPASE, AMYLASE in the last 168 hours. No results for input(s): AMMONIA in the last 168 hours. Diabetic: No results for input(s): HGBA1C in the last 72 hours. No results for input(s): GLUCAP in the last 168 hours. Cardiac Enzymes: No results for input(s): CKTOTAL, CKMB, CKMBINDEX, TROPONINI in the last 168 hours. No results for input(s): PROBNP in the last 8760 hours. Coagulation Profile: No results for input(s): INR, PROTIME in the last 168 hours. Thyroid Function Tests: No results for input(s): TSH, T4TOTAL, FREET4, T3FREE, THYROIDAB in the last 72 hours. Lipid Profile: No results for input(s): CHOL, HDL, LDLCALC, TRIG, CHOLHDL, LDLDIRECT in the last 72 hours. Anemia Panel: No results for input(s): VITAMINB12, FOLATE, FERRITIN, TIBC, IRON, RETICCTPCT in the last 72 hours. Urine analysis:    Component Value Date/Time   COLORURINE AMBER (A) 01/28/2020 1120   APPEARANCEUR CLOUDY (A) 01/28/2020 1120   APPEARANCEUR Clear 09/08/2012 0030   LABSPEC 1.015 01/28/2020 1120   LABSPEC 1.013 09/08/2012 0030   PHURINE 5.0 01/28/2020 1120   GLUCOSEU NEGATIVE 01/28/2020 1120   GLUCOSEU Negative 09/08/2012 0030   HGBUR LARGE (A) 01/28/2020 1120   BILIRUBINUR NEGATIVE 01/28/2020 1120   BILIRUBINUR Negative 09/08/2012 0030   KETONESUR NEGATIVE 01/28/2020 1120   PROTEINUR 100 (A) 01/28/2020 1120   UROBILINOGEN 0.2 01/18/2015 2006   NITRITE NEGATIVE 01/28/2020 1120   LEUKOCYTESUR TRACE (A) 01/28/2020 1120   LEUKOCYTESUR Negative 09/08/2012 0030   Sepsis Labs: Invalid input(s): PROCALCITONIN, LACTICIDVEN  Microbiology: Recent Results (from the past 240 hour(s))  Culture, Urine     Status: Abnormal   Collection Time: 01/28/20  1:57 PM   Specimen: Urine, Random  Result Value Ref Range Status   Specimen Description URINE, RANDOM  Final   Special Requests   Final    NONE Performed at [facility], [address], [place], [address] [place]    Culture 20,000 COLONIES/mL ENTEROCOCCUS  FAECALIS (A)  Final   Report Status 01/30/2020 FINAL  Final   Organism ID, Bacteria ENTEROCOCCUS FAECALIS (A)  Final      Susceptibility   Enterococcus faecalis - MIC*    AMPICILLIN <=2 SENSITIVE Sensitive     NITROFURANTOIN <=16 SENSITIVE Sensitive     VANCOMYCIN 1 SENSITIVE Sensitive     * 20,000 COLONIES/mL ENTEROCOCCUS FAECALIS    Radiology Studies: No results found.    Barbara Hill [facility]  If 7PM-7AM, please contact night-coverage [email or web address] Password  [email or web address] 02/07/2020, 12:08 PM right DP

## 2020-02-07 NOTE — Progress Notes (Signed)
Bilateral lower extremity incision staples removed per MD order without difficulty.  Patient tolerated suture removal well.  Will continue to monitor.

## 2020-02-07 NOTE — Progress Notes (Signed)
Physical Therapy Treatment Patient Details Name: Barbara Hill MRN: [id number] DOB: [birth year] Today's Date: 02/07/2020    History of Present Illness Patient is a 79 y/o female who presented with acute ischemic lower extremities now s/p bilateral popliteal exploration and embolectomy 01/25/20. 24 hours later developed compartment syndrome and went for bilateral 4 compartment fasciotomy 01/26/20.  Has some post op confusion.  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    PT Comments    Pt sleeping upon arrival, but woke easily and agreeable to mobility. Pt required mod assist for bed mobility and squat pivot to and from [facility], pt limited by LE pain and fatigue. Slight open appearance noted in LLE wound s/p staple removal, RN notified. Orthostatics not assessed, pt not complaining of s/s consistent with orthostatic hypotension. Plan updated to reflect SNF level of care, CIR denied pt. PT to continue to follow acutely.    Follow Up Recommendations  Supervision/Assistance - 24 hour;SNF     Equipment Recommendations  Rolling walker with 5" wheels    Recommendations for Other Services       Precautions / Restrictions Precautions Precautions: Fall Precaution Comments: watch BP Restrictions Weight Bearing Restrictions: No    Mobility  Bed Mobility Overal bed mobility: Needs Assistance Bed Mobility: Supine to Sit;Sit to Supine     Supine to sit: Min assist;HOB elevated Sit to supine: Mod assist;HOB elevated   General bed mobility comments: Min-mod assist for trunk and LE management, scooting to and from EOB. Pt with no s/s orthostatic hypotension at EOB or during transfer  Transfers Overall transfer level: Needs assistance Equipment used: 1 person hand held assist Transfers: Sit to/from [employer] Sit to Stand: Mod assist;From elevated surface   Squat pivot transfers: Mod assist;From elevated  surface     General transfer comment: Mod assist for power up, steadying, and guiding hips to and from [facility]. Verbal cuing for hand placement when moving towards BSC, pt unable to come to full standing.  Ambulation/Gait                 Stairs             Wheelchair Mobility    Modified Rankin (Stroke Patients Only)       Balance Overall balance assessment: Needs assistance Sitting-balance support: Feet supported;Single extremity supported Sitting balance-Leahy Scale: Good Sitting balance - Comments: able to perform LE exercises in sitting without LOB   Standing balance support: Bilateral upper extremity supported Standing balance-Leahy Scale: Poor Standing balance comment: decreased standing balance requires support from OT to perform stand pivot                            Cognition Arousal/Alertness: Awake/alert Behavior During Therapy: WFL for tasks assessed/performed Overall Cognitive Status: Impaired/Different from baseline Area of Impairment: Attention;Memory;Following commands;Problem solving                   Current Attention Level: Selective Memory: Decreased short-term memory Following Commands: Follows one step commands with increased time     Problem Solving: Difficulty sequencing;Requires tactile cues;Requires verbal cues General Comments: Pt sleeping upon PT arrival to room, wakes easily. Pt repeating she is cold throughout session, requires repeated cuing and at times has difficulty with safety awareness especially during transfers. STM deficts noted.      Exercises General Exercises - Lower Extremity Long Arc  Quad: AROM;Both;15 reps;Seated Hip Flexion/Marching: AROM;Both;10 reps;Seated    General Comments General comments (skin integrity, edema, etc.): dependent rubor and edema noted LLE>RLE      Pertinent Vitals/Pain Pain Assessment: Faces Faces Pain Scale: Hurts little more Pain Location: BLEs, L>R Pain  Descriptors / Indicators: Sore;Discomfort Pain Intervention(s): Limited activity within patient's tolerance;Monitored during session;Repositioned    Home Living                      Prior Function            PT Goals (current goals can now be found in the care plan section) Acute Rehab PT Goals Patient Stated Goal: to go home PT Goal Formulation: With patient/family Time For Goal Achievement: 02/21/20 Potential to Achieve Goals: Fair Progress towards PT goals: Progressing toward goals    Frequency    Min 2X/week      PT Plan Discharge plan needs to be updated    Co-evaluation              AM-PAC PT "6 Clicks" Mobility   Outcome Measure  Help needed turning from your back to your side while in a flat bed without using bedrails?: A Little Help needed moving from lying on your back to sitting on the side of a flat bed without using bedrails?: A Lot Help needed moving to and from a bed to a chair (including a wheelchair)?: A Lot Help needed standing up from a chair using your arms (Hill.g., wheelchair or bedside chair)?: A Lot Help needed to walk in hospital room?: Total Help needed climbing 3-5 steps with a railing? : Total 6 Click Score: 11    End of Session Equipment Utilized During Treatment: Gait belt Activity Tolerance: Patient limited by pain;Patient limited by fatigue Patient left: with call bell/phone within reach;in bed;with bed alarm set Nurse Communication: Mobility status;Other (comment) (LLE wound appeared slightly open at top s/p staple removal) PT Visit Diagnosis: Other abnormalities of gait and mobility (R26.89);Muscle weakness (generalized) (M62.81);Difficulty in walking, not elsewhere classified (R26.2);Pain Pain - Right/Left: Left Pain - part of body: Leg     Time: 9147-8295 PT Time Calculation (min) (ACUTE ONLY): 26 min  Charges:  $Therapeutic Activity: 23-37 mins                     Barbara Hill, PT Acute Rehabilitation  Services Pager [phone]  Office [phone]   Barbara Hill 02/07/2020, 4:51 PM

## 2020-02-07 NOTE — TOC Initial Note (Addendum)
Transition of Care [facility]) - Initial/Assessment Note    Patient Details  Name: Barbara Hill MRN: [id number] Date of Birth: 1941/02/14  Transition of Care [facility]) CM/SW Contact:    Vinie Sill, [place] Phone Number: 02/07/2020, 3:44 PM  Clinical Narrative:    CSW met with the patient at bedside. CSW introduced self and explained role. CSW discussed PT recommendation for CIR but per CIR assessment, she not at a level for intensive inpatient therapy. CSW discussed short term rehab at [facility] and explained the SNF process. Patient declined SNF. Patient expressed she wants to go home. Patient states her daughter and grandson lives in the home with her, along with her significant other. She states her daughter,Barbara Hill assist with caring for her significant other and she(the patient) must be there to help too. She states she has things she must take of at home and really do not want to go to SNF. Patient appeared hesitant regarding SNF placement but gave permission to send out referrals just in case she decides to go. Patient gave CSW permission to contact her daughter,Barbara Hill.  CSW spoke with Barbara Hill by phone and informed her patient declined SNF. She prefers to go home with [facility]. Barbara Hill shared she wants the patient to be more mobile before discharging home. CSW advised she declined SNF, therefore she would be receiving PT/OT at home. Barbara Hill states she understands. CSW encorage Barbara Hill and patient to further discuss SNF placement , CSW will follow up. Barbara Hill states no preferred [facility] agency. Patient has not received covid vaccines.   PASRR remains pending at this time.  Thurmond Butts, MSW, [place] Clinical Social Worker          Expected Discharge Plan: Skilled Nursing Facility Barriers to Discharge: Continued Medical Work up, SNF Pending bed offer   Patient Goals and CMS Choice        Expected Discharge Plan and Services Expected Discharge Plan: [place] In-house Referral: Clinical  Social Work                                            Prior Living Arrangements/Services   Lives with:: Self, Adult Children, Other (Comment), Significant Other Patient language and need for interpreter reviewed:: No        Need for Family Participation in Patient Care: Yes (Comment) Care giver support system in place?: Yes (comment)   Criminal Activity/Legal Involvement Pertinent to Current Situation/Hospitalization: No - Comment as needed  Activities of Daily Living Home Assistive Devices/Equipment: None ADL Screening (condition at time of admission) Patient's cognitive ability adequate to safely complete daily activities?: Yes Is the patient deaf or have difficulty hearing?: No Does the patient have difficulty seeing, even when wearing glasses/contacts?: No Does the patient have difficulty concentrating, remembering, or making decisions?: No Patient able to express need for assistance with ADLs?: Yes Does the patient have difficulty dressing or bathing?: No Independently performs ADLs?: Yes (appropriate for developmental age) Does the patient have difficulty walking or climbing stairs?: No Weakness of Legs: Both Weakness of Arms/Hands: Both  Permission Sought/Granted Permission sought to share information with : Family Supports Permission granted to share information with : Yes, Verbal Permission Granted  Share Information with NAME: Barbara Hill  Permission granted to share info w AGENCY: SNFs  Permission granted to share info w Relationship: daughter  Permission granted to share info w Contact Information: [phone]  Emotional Assessment Appearance:: Appears stated age Attitude/Demeanor/Rapport: Engaged Affect (typically observed): Appropriate, Pleasant Orientation: : Oriented to Situation, Oriented to  Time, Oriented to Place, Oriented to Self Alcohol / Substance Use: Not Applicable Psych Involvement: No (comment)  Admission diagnosis:  Right leg  pain [M79.604] Ischemic leg [I99.8] Patient Active Problem List   Diagnosis Date Noted  . Protein-calorie malnutrition, severe 02/05/2020  . Ischemic leg 01/25/2020  . COPD (chronic obstructive pulmonary disease) ([place]) 01/25/2020  . Normocytic anemia 01/25/2020  . History of CVA (cerebrovascular accident) 01/25/2020  . Anxiety 01/25/2020  . CKD (chronic kidney disease), stage III 01/25/2020  . 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 Brain aneurysm 06/29/2016  . Lumbar contusion   . Orthostatic hypotension   . First degree AV block   . Hypokalemia   . Syncope 04/10/2016  . Paroxysmal SVT (supraventricular tachycardia) ([place]) 04/21/2011  . 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 Tobacco use disorder 04/21/2011   PCP:  Aletha Halim., PA-C Pharmacy:   [id number] [place], [place] - [address] [place] [address]Phone: [phone]: 3[place] - [address] [address]Phone: [phone]: [phone] [facility] [place]Phone: [phone]: [phone]    Social Determinants of Health (SDOH) Interventions    Readmission Risk Interventions No flowsheet data found.

## 2020-02-07 NOTE — Progress Notes (Signed)
Transitions of Care Pharmacist Note  Barbara Hill is a 79 y.o. female that has been diagnosed with ischemic legs in the setting of bilateral popliteal embolus Bilateral lower extremity  and will be prescribed Eliquis (apixaban) at discharge.   Patient Education: I provided the following education on Eliquis to the patient: How to take the medication Described what the medication is Signs of bleeding Signs/symptoms of VTE and stroke  Answered their questions  Discharge Medications Plan: The patient wants to have their discharge medications filled by the Transitions of Care pharmacy rather than their usual pharmacy.  The discharge orders pharmacy has been changed to the Transitions of Care pharmacy, the patient will receive a phone call regarding co-pay, and their medications will be delivered by the Transitions of Care pharmacy.    Thank you,   Alvia Grove, PharmD PGY1 Acute Care Pharmacy Resident February 07, 2020

## 2020-02-07 NOTE — Progress Notes (Signed)
Orthostatic vital signs attempted.  Pt unable to stand for three minutes even with encouragement and the help of two caregivers due to leg pain with weight bearing.  Will continue to monitor.

## 2020-02-07 NOTE — Progress Notes (Addendum)
  Progress Note    02/07/2020 7:34 AM 9 Days Post-Op  Subjective:  Says she feels better  Afebrile   Vitals:   02/06/20 2314 02/07/20 0500  BP: (!) 161/59   Pulse: 60 60  Resp: 17 15  Temp: 97.7 F (36.5 C) 98 F (36.7 C)  SpO2: 97% 96%    Physical Exam: Cardiac:  regular Lungs:  Non labored Incisions:  Bilateral leg incisions clean with staples in tact Extremities:  Brisk left PT/pero and right DP/pero   CBC    Component Value Date/Time   WBC 6.7 02/07/2020 0323   RBC 3.25 (L) 02/07/2020 0323   HGB 9.2 (L) 02/07/2020 0323   HGB 10.6 (L) 04/10/2019 1047   HCT 30.0 (L) 02/07/2020 0323   HCT 31.9 (L) 04/10/2019 1047   PLT 401 (H) 02/07/2020 0323   PLT 308 04/10/2019 1047   MCV 92.3 02/07/2020 0323   MCV 91 04/10/2019 1047   MCV 92 10/23/2013 0803   MCH 28.3 02/07/2020 0323   MCHC 30.7 02/07/2020 0323   RDW 18.3 (H) 02/07/2020 0323   RDW 11.3 (L) 04/10/2019 1047   RDW 14.0 10/23/2013 0803   LYMPHSABS 2.6 01/24/2020 2200   LYMPHSABS 2.4 10/23/2013 0803   MONOABS 0.7 01/24/2020 2200   MONOABS 0.7 10/23/2013 0803   EOSABS 0.3 01/24/2020 2200   EOSABS 0.3 10/23/2013 0803   BASOSABS 0.0 01/24/2020 2200   BASOSABS 0.1 10/23/2013 0803    BMET    Component Value Date/Time   NA 135 02/04/2020 0348   NA 139 04/10/2019 1047   NA 140 10/23/2013 0803   K 3.5 02/04/2020 0348   K 4.6 10/23/2013 0803   CL 98 02/04/2020 0348   CL 107 10/23/2013 0803   CO2 27 02/04/2020 0348   CO2 28 10/23/2013 0803   GLUCOSE 92 02/04/2020 0348   GLUCOSE 87 10/23/2013 0803   BUN 9 02/04/2020 0348   BUN 19 04/10/2019 1047   BUN 6 (L) 10/23/2013 0803   CREATININE 0.97 02/04/2020 0348   CREATININE 0.66 07/24/2016 1125   CALCIUM 8.6 (L) 02/04/2020 0348   CALCIUM 8.8 10/23/2013 0803   GFRNONAA 56 (L) 02/04/2020 0348   GFRNONAA >60 10/23/2013 0803   GFRAA >60 02/04/2020 0348   GFRAA >60 10/23/2013 0803    INR    Component Value Date/Time   INR 1.3 (H) 01/29/2020 0835    INR 1.1 10/22/2013 0125     Intake/Output Summary (Last 24 hours) at 02/07/2020 0734 Last data filed at 02/07/2020 0645 Gross per 24 hour  Intake 1000 ml  Output 950 ml  Net 50 ml     Assessment:  79 y.o. female is s/p:  S/p bilateral popliteal embolectomy and 4 compartment fasciotomy  9 Days Post-Op  Plan: -pt looks good this am with brisk doppler signals -says she is doing better with getting up and mobilizing. -staples out today.  -continue mobilizing and oob -DVT prophylaxis:  Eliquis   Doreatha Massed, PA-C [facility] [phone] 02/07/2020 7:34 AM  I have seen and evaluated the patient. I agree with the PA note as documented above.  Status post bilateral lower extremity embolectomies and fasciotomies.  Right DP and left PT palpable.  Hemoglobin improving no requirements for transfusion.  Lower extremities are well perfused.  Left leg edema from hematoma is much improved.  We will remove her staples today.  Cephus Shelling, MD [facility] [place] Office: [phone]

## 2020-02-08 LAB — RENAL FUNCTION PANEL
Albumin: 2.5 g/dL — ABNORMAL LOW (ref 3.5–5.0)
Anion gap: 6 (ref 5–15)
BUN: 7 mg/dL — ABNORMAL LOW (ref 8–23)
CO2: 27 mmol/L (ref 22–32)
Calcium: 8.9 mg/dL (ref 8.9–10.3)
Chloride: 104 mmol/L (ref 98–111)
Creatinine, Ser: 1.01 mg/dL — ABNORMAL HIGH (ref 0.44–1.00)
GFR calc Af Amer: >60 mL/min (ref >60)
GFR calc non Af Amer: 53 mL/min — ABNORMAL LOW (ref >60)
Glucose, Bld: 95 mg/dL (ref 70–99)
Phosphorus: 3.3 mg/dL (ref 2.5–4.6)
Potassium: 4.4 mmol/L (ref 3.5–5.1)
Sodium: 137 mmol/L (ref 135–145)

## 2020-02-08 LAB — HEMOGLOBIN AND HEMATOCRIT, BLOOD
HCT: 30 % — ABNORMAL LOW (ref 36.0–46.0)
Hemoglobin: 9.2 g/dL — ABNORMAL LOW (ref 12.0–15.0)

## 2020-02-08 LAB — MAGNESIUM: Magnesium: 1.4 mg/dL — ABNORMAL LOW (ref 1.7–2.4)

## 2020-02-08 MED ORDER — MAGNESIUM SULFATE 4 GM/100ML IV SOLN
4.0000 g | Freq: Once | INTRAVENOUS | Status: AC
  Administered 2020-02-08: 4 g via INTRAVENOUS
  Filled 2020-02-08: qty 100

## 2020-02-08 MED ORDER — AMLODIPINE BESYLATE 5 MG PO TABS
5.0000 mg | ORAL_TABLET | Freq: Every day | ORAL | Status: DC
  Administered 2020-02-08: 5 mg via ORAL
  Filled 2020-02-08: qty 1

## 2020-02-08 NOTE — TOC Progression Note (Signed)
Transition of Care [facility]) - Progression Note    Patient Details  Name: Barbara Hill MRN: [id number] Date of Birth: [birth year]  Transition of [facility]) CM/SW [place], [place] Phone Number: 02/08/2020, 4:30 PM  Clinical Narrative:     CSW met with patient at bedside. Patient states she is agreeable to SNF. Patient has accepted bed offer with [facility].   CSW confirmed bed offer with [facility] is pending and patient can not discharge until PASRR is approved. 30 day note placed on chart for MD signature.  Thurmond Butts, MSW, [place] Clinical Social Worker    Expected Discharge Plan: Skilled Nursing Facility Barriers to Discharge: Continued Medical Work up, SNF Pending bed offer  Expected Discharge Plan and Services Expected Discharge Plan: [place] In-house Referral: Clinical Social Work                                             Social Determinants of Health (SDOH) Interventions    Readmission Risk Interventions No flowsheet data found.

## 2020-02-08 NOTE — Progress Notes (Signed)
Inpatient Rehabilitation Admissions Coordinator  I have recommended SNF rehab for feel patient needs a longer recuperation than CIR can offer. Please call me with any questions. I have notified Dr. Alanda Slim of my recommendation today.  Ottie Glazier, RN, MSN Rehab Admissions Coordinator [phone] 02/08/2020 10:12 AM

## 2020-02-08 NOTE — Care Management Important Message (Signed)
Important Message  Patient Details  Name: Barbara Hill MRN: [id number] Date of Birth: 12/09/1940   Medicare Important Message Given:  Yes     Renie Ora 02/08/2020, 9:49 AM

## 2020-02-08 NOTE — Progress Notes (Signed)
PROGRESS NOTE  Barbara Hill:[id number] DOB: [birth year]   PCP: Richmond Campbell., PA-C  Patient is from: Home.  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  She underwent bilateral popliteal exploration and embolectomy by Dr. Arbie Cookey on 01/25/2020.  She then underwent bilateral fasciotomy on 01/26/2020. TEE negative for cardiac thrombus.  She is currently doing well.  Waiting on SNF placement.  Subjective: Seen and examined earlier this morning.  No major events overnight of this morning.  Complains headache and bilateral lower extremity pain, left greater than right.  Denies chest pain, dyspnea, GI or UTI symptoms. Objective: Vitals:   02/07/20 2300 02/08/20 0352 02/08/20 0735 02/08/20 1130  BP: (!) 132/55 (!) 165/69 (!) 174/66 130/61  Pulse: 63 60 62 61  Resp: 18 17 15 15   Temp: 98.7 F (37.1 C) 99 F (37.2 C) 98.4 F (36.9 C) 98.6 F (37 C)  TempSrc: Oral Oral Oral Oral  SpO2: 94% 94% 95% 95%  Weight:      Height:        Intake/Output Summary (Last 24 hours) at 02/08/2020 1439 Last data filed at 02/08/2020 1300 Gross per 24 hour  Intake 240 ml  Output 750 ml  Net -510 ml   Filed Weights   01/24/20 2115 01/29/20 1046 02/02/20 0314  Weight: 48.5 kg 48.1 kg 50.2 kg    Examination:  GENERAL: Frail looking elderly female.  No apparent distress.  Nontoxic. HEENT: MMM.  Vision and hearing grossly intact.  NECK: Supple.  No apparent JVD.  RESP: On room air.  No IWOB.  Fair aeration bilaterally. CVS:  RRR. Heart sounds normal.  ABD/GI/GU: BS+. Abd soft, NTND.  MSK/EXT:  Moves extremities.  Right DP and left PT pulses palpable.  Cap refill is brisk. SKIN: Fasciotomy wounds on either side of both legs appears clean and dry.  Slightly more  swelling in LLE compared to RLE. NEURO: Awake, alert and oriented appropriately.  No apparent focal neuro deficit. PSYCH: Calm. Normal affect.  Procedures:  6/3-bilateral popliteal exploration and embolectomy.  S 6/4-bilateral fasciotomy on 01/26/2020 6/7-TEE without cardiac thrombus or PFO.  Microbiology summarized: 6/3-COVID-19 PCR negative. 6/6-urine culture with 20,000 colonies of Enterococcus faecalis.  Assessment & Plan: Multiple ischemic legs in the setting of bilateral popliteal embolus Bilateral lower extremity compartment syndrome -No clear source of embolus.  TEE negative for PFO or thrombus. -Bilateral popliteal exploration and embolectomy on 6/3 by Dr. 8/3 -Bilateral fasciotomies on 6/4 by Dr. 8/4 Arbie Cookey removed on 6/16 -Continue Eliquis anticoagulation -Good right DP and left PT pulses with brisk cap refills in toes -Encouraged tobacco cessation.  Orthostatic hypotension in patient with history of essential hypertension: SBP dropped from 144-108 from lying to standing at 0-minute.  No orthostatic vitals this morning. -Orthostatic vitals twice daily -Continue low-dose metoprolol and as needed hydralazine -We may have to hold antihypertensive medications or treated standing medications.  Paroxysmal SVT: Stable -Continue metoprolol and flecainide  Hypokalemia/hypomagnesemia: Mg 1.4. -IV magnesium sulfate 4 g x 1 -Monitor and replace as appropriate  Chronic COPD/tobacco use-coughing intermittently but no respiratory distress. -Continue breathing treatments -Encourage tobacco cessation   Acute metabolic encephalopathy-multifactorial including UTI.  Resolved. -Treat treatable course -Reorientation and delirium precautions  Enterococcus UTI with hematuria: Resolved. -Completed antibiotic course.  ABLA: Likely from surgical loss.  Baseline Hgb~10> 10 (admit)> 6.43u>>>> 9.2.  Iron sat 6%.  TIBC 246.  Ferritin 97.  FOBT negative.  Large blood on  UA. -Continue monitoring  Hematuria in the setting of UTI-felt to be due to UTI.  Renal ultrasound negative. -Urology recommended outpatient follow-up for hematuria work-up after discharge.  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  No focal neuro deficits -She was on Plavix prior to admission which is discontinued.  Question about indication at this time.  She is also on Eliquis which would increase the risk of bleeding.  Chronic kidney disease stage IIIA: Baseline 1.2-1.3> 0.99> 1.01.  Stable. -Continue monitoring  Anxiety: Stable -Continue home Prozac and as needed Xanax  GERD -Continue PPI  Severe malnutrition: Significant muscle mass and subcu fat loss. Body mass index is 19.6 kg/m. Nutrition Problem: Severe Malnutrition Etiology: chronic illness (COPD) Signs/Symptoms: severe fat depletion, severe muscle depletion Interventions: Ensure Enlive (each supplement provides 350kcal and 20 grams of protein), MVI   DVT prophylaxis:   apixaban (ELIQUIS) tablet 5 mg  Code Status: Full code Family Communication: Patient and/or RN. Available if any question.  Status is: Inpatient  Remains inpatient appropriate because:Hemodynamically unstable, Unsafe d/c plan and Inpatient level of care appropriate due to severity of illness   Dispo: The patient is from: Home              Anticipated d/c is to: SNF              Anticipated d/c date is: 1 day              Patient currently is not medically stable to d/c.       Consultants:  Vascular surgery Urology over the phone   Sch Meds:  Scheduled Meds: . amLODipine  5 mg Oral Daily  . apixaban  5 mg Oral BID  . Chlorhexidine Gluconate Cloth  6 each Topical Daily  . feeding supplement (ENSURE ENLIVE)  237 mL Oral BID BM  . flecainide  50 mg Oral BID  . FLUoxetine  20 mg Oral BID  . metoprolol succinate  25 mg Oral QHS  . multivitamin with minerals  1 tablet Oral Daily  .  pantoprazole  40 mg Oral Daily  . sodium chloride flush  3 mL Intravenous Q12H  . vitamin B-12  1,000 mcg Oral Daily   Continuous Infusions: PRN Meds:.acetaminophen **OR** acetaminophen, ALPRAZolam, butalbital-acetaminophen-caffeine, guaiFENesin-dextromethorphan, hydrALAZINE, ondansetron **OR** ondansetron (ZOFRAN) IV, oxyCODONE  Antimicrobials: Anti-infectives (From admission, onward)   Start     Dose/Rate Route Frequency Ordered Stop   01/30/20 1630  amoxicillin (AMOXIL) capsule 500 mg  Status:  Discontinued        500 mg Oral Every 8 hours 01/30/20 1619 02/06/20 1623   01/30/20 1630  fluconazole (DIFLUCAN) tablet 150 mg        150 mg Oral  Once 01/30/20 1619 01/30/20 1733   01/28/20 1530  cefTRIAXone (ROCEPHIN) 1 g in sodium chloride 0.9 % 100 mL IVPB  Status:  Discontinued       Note to Pharmacy: Allergy list reviewed Per checklist, should be OK for cephalosporin   1 g 200 mL/hr over 30 Minutes Intravenous Every 24 hours 01/28/20 1358 01/30/20 1619   01/26/20 0918  ceFAZolin (ANCEF) 2-4 GM/100ML-% IVPB       Note to Pharmacy: Jasmine Pang   : cabinet override      01/26/20 0918 01/26/20 2129  I have personally reviewed the following labs and images: CBC: Recent Labs  Lab 02/03/20 1651 02/03/20 1651 02/04/20 0348 02/05/20 0435 02/06/20 0213 02/07/20 0323 02/08/20 0244  WBC 7.5  --  8.7 8.1 7.9 6.7  --   HGB 8.2*   < > 7.7* 8.6* 8.6* 9.2* 9.2*  HCT 26.3*   < > 24.9* 27.4* 27.6* 30.0* 30.0*  MCV 89.2  --  89.2 89.5 90.8 92.3  --   PLT 346  --  331 383 374 401*  --    < > = values in this interval not displayed.   BMP &GFR Recent Labs  Lab 02/02/20 0349 02/03/20 0417 02/04/20 0348 02/07/20 0323 02/08/20 0244  NA 137 138 135 136 137  K 4.1 4.4 3.5 3.4* 4.4  CL 103 101 98 101 104  CO2 25 27 27 27 27   GLUCOSE 112* 100* 92 91 95  BUN 17 12 9  7* 7*  CREATININE 0.94 0.96 0.97 0.99 1.01*  CALCIUM 8.7* 8.8* 8.6* 8.5* 8.9  MG  --   --   --   --  1.4*  PHOS   --   --   --   --  3.3   Estimated Creatinine Clearance: 36.4 mL/min (A) (by C-G formula based on SCr of 1.01 mg/dL (H)). Liver & Pancreas: Recent Labs  Lab 02/08/20 0244  ALBUMIN 2.5*   No results for input(s): LIPASE, AMYLASE in the last 168 hours. No results for input(s): AMMONIA in the last 168 hours. Diabetic: No results for input(s): HGBA1C in the last 72 hours. No results for input(s): GLUCAP in the last 168 hours. Cardiac Enzymes: No results for input(s): CKTOTAL, CKMB, CKMBINDEX, TROPONINI in the last 168 hours. No results for input(s): PROBNP in the last 8760 hours. Coagulation Profile: No results for input(s): INR, PROTIME in the last 168 hours. Thyroid Function Tests: No results for input(s): TSH, T4TOTAL, FREET4, T3FREE, THYROIDAB in the last 72 hours. Lipid Profile: No results for input(s): CHOL, HDL, LDLCALC, TRIG, CHOLHDL, LDLDIRECT in the last 72 hours. Anemia Panel: No results for input(s): VITAMINB12, FOLATE, FERRITIN, TIBC, IRON, RETICCTPCT in the last 72 hours. Urine analysis:    Component Value Date/Time   COLORURINE AMBER (A) 01/28/2020 1120   APPEARANCEUR CLOUDY (A) 01/28/2020 1120   APPEARANCEUR Clear 09/08/2012 0030   LABSPEC 1.015 01/28/2020 1120   LABSPEC 1.013 09/08/2012 0030   PHURINE 5.0 01/28/2020 1120   GLUCOSEU NEGATIVE 01/28/2020 1120   GLUCOSEU Negative 09/08/2012 0030   HGBUR LARGE (A) 01/28/2020 1120   BILIRUBINUR NEGATIVE 01/28/2020 1120   BILIRUBINUR Negative 09/08/2012 0030   KETONESUR NEGATIVE 01/28/2020 1120   PROTEINUR 100 (A) 01/28/2020 1120   UROBILINOGEN 0.2 01/18/2015 2006   NITRITE NEGATIVE 01/28/2020 1120   LEUKOCYTESUR TRACE (A) 01/28/2020 1120   LEUKOCYTESUR Negative 09/08/2012 0030   Sepsis Labs: Invalid input(s): PROCALCITONIN, LACTICIDVEN  Microbiology: No results found for this or any previous visit (from the past 240 hour(s)).  Radiology Studies: No results found.    Javionna Leder T. Ramey Schiff [facility]ist  If 7PM-7AM, please contact night-coverage [email or web address] Password [facility] 02/08/2020, 2:39 PM right DP

## 2020-02-08 NOTE — Progress Notes (Signed)
[facility]  Subjective  - no complaints   Objective (!) 174/66 62 98.4 F (36.9 C) (Oral) 15 95%  Intake/Output Summary (Last 24 hours) at 02/08/2020 0916 Last data filed at 02/07/2020 1100 Gross per 24 hour  Intake --  Output 700 ml  Net -700 ml    Right DP and left PT palpable  Laboratory Lab Results: Recent Labs    02/06/20 0213 02/06/20 0213 02/07/20 0323 02/08/20 0244  WBC 7.9  --  6.7  --   HGB 8.6*   < > 9.2* 9.2*  HCT 27.6*   < > 30.0* 30.0*  PLT 374  --  401*  --    < > = values in this interval not displayed.   BMET Recent Labs    02/07/20 0323 02/08/20 0244  NA 136 137  K 3.4* 4.4  CL 101 104  CO2 27 27  GLUCOSE 91 95  BUN 7* 7*  CREATININE 0.99 1.01*  CALCIUM 8.5* 8.9    COAG Lab Results  Component Value Date   INR 1.3 (H) 01/29/2020   INR 1.11 11/12/2016   INR 1.10 06/22/2016   No results found for: PTT  Assessment/Planning:  Status post bilateral lower extremity embolectomies and fasciotomies.  Right DP and left PT palpable.  Hemoglobin stable.  Lower extremities are well perfused.  Left leg edema from hematoma is much improved. Staples removed yesterday.  Cephus Shelling 02/08/2020 9:16 AM --

## 2020-02-09 LAB — HEMOGLOBIN AND HEMATOCRIT, BLOOD
HCT: 31.2 % — ABNORMAL LOW (ref 36.0–46.0)
Hemoglobin: 9.7 g/dL — ABNORMAL LOW (ref 12.0–15.0)

## 2020-02-09 LAB — RENAL FUNCTION PANEL
Albumin: 2.5 g/dL — ABNORMAL LOW (ref 3.5–5.0)
Anion gap: 8 (ref 5–15)
BUN: 9 mg/dL (ref 8–23)
CO2: 28 mmol/L (ref 22–32)
Calcium: 8.8 mg/dL — ABNORMAL LOW (ref 8.9–10.3)
Chloride: 100 mmol/L (ref 98–111)
Creatinine, Ser: 1.17 mg/dL — ABNORMAL HIGH (ref 0.44–1.00)
GFR calc Af Amer: 52 mL/min — ABNORMAL LOW (ref >60)
GFR calc non Af Amer: 45 mL/min — ABNORMAL LOW (ref >60)
Glucose, Bld: 99 mg/dL (ref 70–99)
Phosphorus: 3.9 mg/dL (ref 2.5–4.6)
Potassium: 4.1 mmol/L (ref 3.5–5.1)
Sodium: 136 mmol/L (ref 135–145)

## 2020-02-09 LAB — SARS CORONAVIRUS 2 (TAT 6-24 HRS): SARS Coronavirus 2: NEGATIVE

## 2020-02-09 LAB — MAGNESIUM: Magnesium: 2.2 mg/dL (ref 1.7–2.4)

## 2020-02-09 MED ORDER — BUTALBITAL-APAP-CAFFEINE 50-325-40 MG PO TABS: 1.0000 | ORAL_TABLET | Freq: Three times a day (TID) | ORAL | 3 refills | Status: AC | PRN

## 2020-02-09 MED ORDER — POLYETHYLENE GLYCOL 3350 17 GM/SCOOP PO POWD
17.0000 g | Freq: Two times a day (BID) | ORAL | 0 refills | Status: DC | PRN
Start: 2020-02-09 — End: 2020-11-19

## 2020-02-09 MED ORDER — APIXABAN 5 MG PO TABS: 5.0000 mg | ORAL_TABLET | Freq: Two times a day (BID) | ORAL | 1 refills | Status: DC

## 2020-02-09 MED ORDER — CYANOCOBALAMIN 1000 MCG PO TABS: 1000.0000 ug | ORAL_TABLET | Freq: Every day | ORAL | Status: DC

## 2020-02-09 MED ORDER — OXYCODONE HCL 5 MG PO TABS: 5.0000 mg | ORAL_TABLET | Freq: Three times a day (TID) | ORAL | 0 refills | Status: AC | PRN

## 2020-02-09 MED ORDER — ALPRAZOLAM 0.5 MG PO TABS: 0.5000 mg | ORAL_TABLET | Freq: Three times a day (TID) | ORAL | 0 refills | Status: AC | PRN

## 2020-02-09 MED ORDER — SENNOSIDES-DOCUSATE SODIUM 8.6-50 MG PO TABS
1.0000 | ORAL_TABLET | Freq: Two times a day (BID) | ORAL | 0 refills | Status: DC | PRN
Start: 2020-02-09 — End: 2020-11-19

## 2020-02-09 MED ORDER — ADULT MULTIVITAMIN W/MINERALS CH: 1.0000 | ORAL_TABLET | Freq: Every day | ORAL | Status: AC

## 2020-02-09 MED ORDER — FERROUS SULFATE 325 (65 FE) MG PO TABS: 325.0000 mg | ORAL_TABLET | Freq: Two times a day (BID) | ORAL | 1 refills | Status: AC

## 2020-02-09 MED ORDER — ENSURE ENLIVE PO LIQD: 237.0000 mL | Freq: Two times a day (BID) | ORAL | 12 refills | Status: AC

## 2020-02-09 NOTE — Progress Notes (Signed)
Occupational Therapy Treatment Patient Details Name: Barbara Hill MRN: [id number] DOB: 08-01-1941 Today's Date: 02/09/2020    History of present illness Patient is a 79 y/o female who presented with acute ischemic lower extremities now s/p bilateral popliteal exploration and embolectomy 01/25/20. 24 hours later developed compartment syndrome and went for bilateral 4 compartment fasciotomy 01/26/20.  Has some post op confusion.  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   OT comments  Pt. Seen for skilled OT treatment session.  Focus on components of sit/stand in preparation for toilet transfers and bed mobility.  Pt. Seated in chair. Able to perform seated  Chair push ups with instuction for hand placement on arm rests and how to transition into standing. By end of session able to achieve full standing with rw with mod a and then reach back for arm rests and sit. Pt. Very pleased and eager to cont. Getting stronger.  Follow Up Recommendations  CIR    Equipment Recommendations  Other (comment)    Recommendations for Other Services Rehab consult    Precautions / Restrictions Precautions Precautions: Fall Precaution Comments: watch BP       Mobility Bed Mobility Overal bed mobility: Needs Assistance Bed Mobility: Supine to Sit     Supine to sit: Min assist     General bed mobility comments: min assist for trunk elevation, scooting to EOB; pt able to bring LEs to EOB in step-wise fashion and with increased time  Transfers Overall transfer level: Needs assistance Equipment used: Rolling walker (2 wheeled) Transfers: Sit to/from Stand Sit to Stand: Mod assist Stand pivot transfers: Mod assist;From elevated surface       General transfer comment: 1st attempt unable to get into full standing with reports of foot pain and them "slipping" when she was trying to bring hands from arm rests to rw. after exercises and  education on hand placement and foot placement pt. able to come into standing with rw.    Balance Overall balance assessment: Needs assistance Sitting-balance support: Feet supported;Single extremity supported Sitting balance-Leahy Scale: Fair     Standing balance support: Bilateral upper extremity supported Standing balance-Leahy Scale: Poor                             ADL either performed or assessed with clinical judgement   ADL                                               Vision       Perception     Praxis      Cognition Arousal/Alertness: Awake/alert Behavior During Therapy: WFL for tasks assessed/performed Overall Cognitive Status: Within Functional Limits for tasks assessed                                          Exercises General Exercises - Lower Extremity Ankle Circles/Pumps: AROM;Both;20 reps;Seated Long Arc Quad: AROM;Both;Seated;10 reps Other Exercises Other Exercises: contract relax L plantarflexors stretch, x3 with sustained hold 15-30 seconds based on pt tolerance. Pt lacks ~15* LLE DF Other Exercises: pt. reports difficulty transitioning from sit/stand.  educated and had pt. perfrom chair push  ups 3 sets of 5. incorporated actual sit/stand at end and she was able to get into standing with less assistance   Shoulder Instructions       General Comments      Pertinent Vitals/ Pain       Pain Assessment: Faces Faces Pain Scale: Hurts little more Pain Location: L foot and ankle Pain Descriptors / Indicators: Sore;Discomfort Pain Intervention(s): Limited activity within patient's tolerance;Monitored during session;Repositioned  Home Living                                          Prior Functioning/Environment              Frequency  Min 2X/week        Progress Toward Goals  OT Goals(current goals can now be found in the care plan section)     Acute Rehab OT  Goals Patient Stated Goal: to go home  Plan Discharge plan remains appropriate    Co-evaluation                 AM-PAC OT "6 Clicks" Daily Activity     Outcome Measure   Help from another person eating meals?: A Little Help from another person taking care of personal grooming?: A Little Help from another person toileting, which includes using toliet, bedpan, or urinal?: A Lot Help from another person bathing (including washing, rinsing, drying)?: A Lot Help from another person to put on and taking off regular upper body clothing?: A Little Help from another person to put on and taking off regular lower body clothing?: A Lot 6 Click Score: 15    End of Session Equipment Utilized During Treatment: Rolling walker  OT Visit Diagnosis: Unsteadiness on feet (R26.81);Muscle weakness (generalized) (M62.81);Pain;Other symptoms and signs involving cognitive function Pain - part of body: Leg;Ankle and joints of foot   Activity Tolerance Patient tolerated treatment well   Patient Left in chair;with call bell/phone within reach;with chair alarm set   Nurse Communication          Time: 1275-1700 OT Time Calculation (min): 15 min  Charges: OT General Charges $OT Visit: 1 Visit OT Treatments $Therapeutic Exercise: 8-22 mins  Boneta Lucks, COTA/L Acute Rehabilitation [phone] here}   Robet Leu 02/09/2020, 2:29 PM

## 2020-02-09 NOTE — Discharge Summary (Signed)
Physician Discharge Summary  Donne HazelBrenda M Pasley WJX:[id number] DOB: 06/27/1941 DOA: 01/24/2020  PCP: Richmond CampbellKaplan, Kristen W., PA-C  Admit date: 01/24/2020 Discharge date: 02/09/2020  Admitted From: Home Disposition: SNF  Recommendations for Outpatient Follow-up:  1. Follow ups as below. 2. Please obtain CBC/BMP/Mag at follow up 3. Please follow up on the following pending results: None   Discharge Condition: Stable CODE STATUS: Full code   Follow-up Information    Early, Kristen Loaderodd F, MD In 3 weeks.   Specialties: Vascular Surgery, Cardiology Why: Office will call you to arrange your appt (sent) Contact information: [address] [place] [address] [phone]        Pricilla Riffleoss, Paula V, MD. Schedule an appointment as soon as possible for a visit in 3 week(s).   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  She underwent bilateral popliteal exploration and embolectomy by Dr. Arbie CookeyEarly on 01/25/2020.  She then underwent bilateral fasciotomy on 01/26/2020. TEE negative for cardiac thrombus. Started on Eliquis for anticoagulation.  Patient continues to do well.  Had good right DP pulse and left PT pulses.  Staples removed by vascular surgery.  Surgical wound remained clean.  Evaluated by therapy who recommended SNF.  See individual problem list below for more hospital course.  Discharge Diagnoses:  Multiple ischemic legs in the setting of bilateral popliteal embolus Bilateral lower extremity compartment syndrome -No clear source of embolus.  TEE negative for PFO or thrombus. -Bilateral popliteal exploration and embolectomy on 6/3 by Dr. Arbie CookeyEarly -Bilateral fasciotomies on 6/4 by Dr.  Arbie CookeyEarly Cherlynn Polo-Staples removed on 6/16 -Continue Eliquis anticoagulation -Good right DP and left PT pulses with brisk cap refills in toes -Encouraged tobacco cessation. -Outpatient follow-up with vascular surgery as above  Orthostatic hypotension in patient with history of essential hypertension:  Improved. -Discontinued home losartan. -Could benefit from compression socks or TED hose once surgical wound heals completely  Paroxysmal SVT: Stable -Continue metoprolol and flecainide  Hypokalemia/hypomagnesemia: Resolved.  Chronic COPD/tobacco use-coughing intermittently but no respiratory distress. -Continue breathing treatments -Encouraged tobacco cessation   Acute metabolic encephalopathy-multifactorial including UTI.  Resolved.  Enterococcus UTI with hematuria: Resolved. -Completed antibiotic course.  ABLA: Likely from surgical loss.  Baseline Hgb~10> 10 (admit)> 6.43u>>>> 9.2>9.7.  Iron sat 6%.  TIBC 246.  Ferritin 97.  FOBT negative.  Large blood on UA. -P.o. ferrous sulfate twice daily with bowel regimen -Recheck CBC in 1 week  Hematuria in the setting of UTI-felt to be due to UTI.  Renal ultrasound negative. -Urology recommended outpatient follow-up for hematuria work-up after discharge.  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  No focal neuro deficits -She was on Plavix prior to admission which is discontinued due risk for bleeding with Eliquis.  Chronic kidney disease stage IIIA: Baseline 1.2-1.3> 0.99> 1.01>1.17.  Stable. -Discontinue losartan. -Recheck renal function in 1 week  Anxiety: Stable -Continue home Prozac and as needed Xanax  GERD -Continue PPI  Severe malnutrition: Significant muscle mass and subcu fat loss. Body mass index is 19.6 kg/m. Nutrition Problem: Severe Malnutrition Etiology: chronic illness (COPD) Signs/Symptoms: severe fat depletion, severe muscle depletion Interventions: Ensure Enlive  (each  supplement provides 350kcal and 20 grams of protein), MVI      Discharge Exam: Vitals:   02/08/20 2330 02/09/20 0641  BP:    Pulse: 62 60  Resp: 15 17  Temp:  98.4 F (36.9 C)  SpO2: 96% 94%    GENERAL: No apparent distress.  Nontoxic. HEENT: MMM.  Vision and hearing grossly intact.  NECK: Supple.  No apparent JVD.  RESP:  No IWOB.  Fair aeration bilaterally. CVS:  RRR. Heart sounds normal.  ABD/GI/GU: Bowel sounds present. Soft. Non tender.  MSK/EXT:  Moves extremities.  Right DP and left PT pulses palpable.  Cap refill is brisk. SKIN: Fasciotomy wounds on either side of both legs appears clean and dry. NEURO: Awake, alert and oriented appropriately.  No apparent focal neuro deficit. PSYCH: Calm. Normal affect.  Discharge Instructions  Discharge Instructions    Call MD for:  difficulty breathing, headache or visual disturbances   Complete by: As directed    Call MD for:  extreme fatigue   Complete by: As directed    Call MD for:  persistant nausea and vomiting   Complete by: As directed    Call MD for:  redness, tenderness, or signs of infection (pain, swelling, redness, odor or green/yellow discharge around incision site)   Complete by: As directed    Call MD for:  severe uncontrolled pain   Complete by: As directed    Call MD for:  temperature >100.4   Complete by: As directed    Diet - low sodium heart healthy   Complete by: As directed    Increase activity slowly   Complete by: As directed    No dressing needed   Complete by: As directed      Allergies as of 02/09/2020      Reactions   Dobutamine Other (See Comments)   Heart beating hard with CP, neck pain, and weakness   Epinephrine Other (See Comments)   Fast heart beat   Darvocet [propoxyphene N-acetaminophen] Nausea And Vomiting   Excedrin Extra Strength [aspirin-acetaminophen-caffeine] Nausea And Vomiting   Aspirin    Clarithromycin Other (See Comments)   Yeast infection   Doxycycline Other  (See Comments)   Makes stomach hurt   Penicillins Other (See Comments)   Yeast infection   Prednisone Other (See Comments)   Shakes   Propoxyphene Nausea And Vomiting      Medication List    STOP taking these medications   clopidogrel 75 MG tablet Commonly known as: PLAVIX     TAKE these medications   acetaminophen 650 MG CR tablet Commonly known as: TYLENOL Take 1,300 mg by mouth every 8 (eight) hours as needed for pain.   ALPRAZolam 0.5 MG tablet Commonly known as: XANAX Take 0.5 mg by mouth 3 (three) times daily.   apixaban 5 MG Tabs tablet Commonly known as: ELIQUIS Take 1 tablet (5 mg total) by mouth 2 (two) times daily.   butalbital-acetaminophen-caffeine 50-325-40 MG tablet Commonly known as: FIORICET Take 1 tablet by mouth every 8 (eight) hours as needed for headache or migraine. What changed:   how much to take  when to take this   cholecalciferol 1000 units tablet Commonly known as: VITAMIN D Take 1,000 Units by mouth daily.   cyanocobalamin 1000 MCG tablet Take 1 tablet (1,000 mcg total) by mouth daily.   feeding supplement (ENSURE ENLIVE) Liqd Take 237 mLs by mouth 2 (two) times daily between meals.   flecainide 50 MG tablet Commonly known  as: TAMBOCOR Take 1 tablet (50 mg total) by mouth 2 (two) times daily.   FLUoxetine 20 MG capsule Commonly known as: PROZAC Take 20 mg by mouth 2 (two) times daily.   fluticasone 50 MCG/ACT nasal spray Commonly known as: FLONASE Place 1 spray into both nostrils daily as needed for allergies or rhinitis.   losartan 25 MG tablet Commonly known as: COZAAR TAKE 1 TABLET BY MOUTH EVERY DAY   metoprolol succinate 25 MG 24 hr tablet Commonly known as: TOPROL-XL TAKE 1 TABLET BY MOUTH EVERYDAY AT BEDTIME What changed:   how much to take  how to take this  when to take this  additional instructions   multivitamin with minerals Tabs tablet Take 1 tablet by mouth daily.   omeprazole 40 MG  capsule Commonly known as: PRILOSEC Take 40 mg by mouth daily.   oxyCODONE 5 MG immediate release tablet Commonly known as: Oxy IR/ROXICODONE Take 1 tablet (5 mg total) by mouth every 8 (eight) hours as needed for up to 5 days for moderate pain.   polyethylene glycol powder 17 GM/SCOOP powder Commonly known as: MiraLax Take 17 g by mouth 2 (two) times daily as needed for moderate constipation.   PROBIOTIC PO Take liquid probiotic by mouth twice daily (Activia)   senna-docusate 8.6-50 MG tablet Commonly known as: Senokot-S Take 1 tablet by mouth 2 (two) times daily between meals as needed for mild constipation.            Discharge Care Instructions  (From admission, onward)         Start     Ordered   02/09/20 0000  No dressing needed        02/09/20 9147          Consultations:  Vascular surgery  Procedures/Studies: 6/3-bilateral popliteal exploration and embolectomy.  S 6/4-bilateral fasciotomy on 01/26/2020 6/7-TEE without cardiac thrombus or PFO.   CT Angio Aortobifemoral W and/or Wo Contrast  Result Date: 01/25/2020 CLINICAL DATA:  Right lower leg pain and decreased pulses EXAM: CT ANGIOGRAPHY OF ABDOMINAL AORTA WITH ILIOFEMORAL RUNOFF TECHNIQUE: Multidetector CT imaging of the abdomen, pelvis and lower extremities was performed using the standard protocol during bolus administration of intravenous contrast. Multiplanar CT image reconstructions and MIPs were obtained to evaluate the vascular anatomy. CONTRAST:  75mL OMNIPAQUE IOHEXOL 350 MG/ML SOLN COMPARISON:  None. FINDINGS: VASCULAR Aorta: Abdominal aorta demonstrates mild atherosclerotic calcifications without aneurysmal dilatation. Celiac: Patent without evidence of aneurysm, dissection, vasculitis or significant stenosis. SMA: Patent without evidence of aneurysm, dissection, vasculitis or significant stenosis. Renals: Single renal arteries are identified bilaterally and widely patent. IMA: Patent without  evidence of aneurysm, dissection, vasculitis or significant stenosis. RIGHT Lower Extremity Inflow: Common iliac, external iliac and internal iliac arteries are widely patent. No aneurysmal dilatation is seen. Runoff: Common femoral artery and femoral bifurcation are within normal limits. Profundus femoral artery is occluded shortly after its origin. The superficial femoral artery is within normal limits. Mild atherosclerotic calcifications are noted. The popliteal artery is patent proximally however occludes just beyond the knee joint. There is reconstitution of the anterior tibial artery just beyond its origin. The distal anterior tibial artery is not well visualized due to timing of contrast bolus. The tibioperoneal trunk reconstitutes shortly after its origin with evidence of diffuse atherosclerotic calcifications. The peroneal artery is diminutive. The posterior tibial artery continues to the level of the foot. LEFT Lower Extremity Inflow: Common iliac artery, internal and external iliac arteries are patent. Runoff: Common  femoral artery and femoral bifurcation are patent. Superficial femoral artery is widely patent with mild atherosclerotic calcifications. The popliteal artery is patent throughout its course with the exception of the distal most aspect where there is a filling defect identified suspicious for embolus. Anterior tibial artery and tibioperoneal trunk are visualized just beyond this filling defect and patent. The anterior tibial artery continues into the mid calf and is diminutive distally. Primary runoff is noted via the posterior tibial artery to the level of the ankle and subsequently into the foot. The peroneal artery is diminutive. Veins: No specific venous abnormality is noted. Review of the MIP images confirms the above findings. NON-VASCULAR Lower chest: Mild bibasilar atelectasis is noted right greater than left. Hepatobiliary: No focal liver abnormality is seen. Status post  cholecystectomy. No biliary dilatation. Pancreas: Unremarkable. No pancreatic ductal dilatation or surrounding inflammatory changes. Spleen: Normal in size without focal abnormality. Adrenals/Urinary Tract: Adrenal glands are within normal limits bilaterally. Kidneys demonstrate a normal enhancement pattern. Bladder is partially distended. Stomach/Bowel: Diverticular change of the colon is noted without evidence of diverticulitis. No obstructive or inflammatory change of the bowel is seen. The appendix is not visualized consistent with a prior surgical history. Lymphatic: No specific lymphadenopathy is noted. Reproductive: Uterus has been surgically removed. No adnexal mass is noted. Other: No abdominal wall hernia or abnormality. No abdominopelvic ascites. Musculoskeletal: No acute or significant osseous findings. IMPRESSION: VASCULAR Atherosclerotic calcifications are noted throughout the arterial tree. Short segment occlusion of the right distal popliteal artery is noted with reconstitution of the anterior tibial artery and tibioperoneal trunk as described above. Dominant runoff is noted via the posterior tibial artery. Short segment occlusion versus high-grade stenosis of the left popliteal artery distally which appears to be related to a focal embolus. Reconstitution of the anterior tibial and tibioperoneal trunk is seen with dominant runoff via the posterior tibial artery. NON-VASCULAR Diverticular change without diverticulitis. Basilar atelectasis right greater than left. No acute abnormality noted. Electronically Signed   By: Inez Catalina M.D.   On: 01/25/2020 02:59   US RENAL  Result Date: 02/01/2020 CLINICAL DATA:  79 year old female with hematuria. EXAM: RENAL / URINARY TRACT ULTRASOUND COMPLETE COMPARISON:  01/25/2020 CT prior studies FINDINGS: Right Kidney: Renal measurements: 8.9 x 3.7 x 5 cm = volume: 85 mL. A 1.9 cm cyst within the mid RIGHT kidney is again noted. No solid mass or hydronephrosis.  Renal echogenicity is UPPER limits of normal. Cortical thinning is present. Left Kidney: Renal measurements: 9.6 x 4.8 x 5.4 cm = volume: 130 mL. A 1.3 cm UPPER pole cyst is present. No solid mass or hydronephrosis. Renal echogenicity is UPPER limits normal. Cortical thinning is noted. Bladder: Appears normal for degree of bladder distention. Other: None. IMPRESSION: 1. Bilateral renal cortical thinning and UPPER limits of normal renal echogenicity which may be seen with medical renal disease. 2. No findings on this study to suggest a cause for this patient's hematuria. Clinical follow-up recommended. Electronically Signed   By: Margarette Canada M.D.   On: 02/01/2020 19:30   DG CHEST PORT 1 VIEW  Result Date: 01/28/2020 CLINICAL DATA:  Altered level of consciousness, shortness of breath, post BILATERAL popliteal artery embolectomy 01/26/2020 EXAM: PORTABLE CHEST 1 VIEW COMPARISON:  Portable exam 1030 hours compared to 03/23/2015 FINDINGS: Borderline enlargement of cardiac silhouette. Mediastinal contours and pulmonary vascularity normal. Emphysematous and bronchitic changes consistent with COPD. Minimal bibasilar atelectasis. Remaining lungs clear. No infiltrate, pleural effusion, or pneumothorax. Bones demineralized. IMPRESSION: Changes of  COPD with minimal bibasilar atelectasis. Electronically Signed   By: Ulyses Southward M.D.   On: 01/28/2020 12:25   ECHOCARDIOGRAM COMPLETE  Result Date: 01/25/2020    ECHOCARDIOGRAM REPORT   Patient Name:   Barbara Hill Date of Exam: 01/25/2020 Medical Rec #:  [id number]      Height:       63.0 in Accession #:    [id number]     Weight:       107.0 lb Date of Birth:  [birth year]       BSA:          1.482 m Patient Age:    78 years       BP:           112/50 mmHg Patient Gender: F              HR:           66 bpm. Exam Location:  Inpatient Procedure: 2D Echo, Pediatric Echo and Cardiac Doppler Indications:    Embolism  History:        Patient has prior history of Echocardiogram  examinations, most                 recent 04/01/2016. COPD and Stroke, Arrythmias:Atrial Fibrillation                 and SVT; Risk Factors:Hypertension and Current Smoker. S/p                 Popliteal surgery this.  Sonographer:    Lavenia Atlas Referring Phys: [phone] RONDELL A SMITH IMPRESSIONS  1. Left ventricular ejection fraction, by estimation, is 60 to 65%. The left ventricle has normal function. The left ventricle has no regional wall motion abnormalities. There is mild left ventricular hypertrophy. Left ventricular diastolic parameters are consistent with Grade II diastolic dysfunction (pseudonormalization).  2. Right ventricular systolic function is normal. The right ventricular size is normal. There is normal pulmonary artery systolic pressure.  3. The mitral valve is normal in structure. Trivial mitral valve regurgitation. No evidence of mitral stenosis.  4. The aortic valve is normal in structure. Aortic valve regurgitation is mild. No aortic stenosis is present. FINDINGS  Left Ventricle: Left ventricular ejection fraction, by estimation, is 60 to 65%. The left ventricle has normal function. The left ventricle has no regional wall motion abnormalities. The left ventricular internal cavity size was normal in size. There is  mild left ventricular hypertrophy. Left ventricular diastolic parameters are consistent with Grade II diastolic dysfunction (pseudonormalization). Right Ventricle: The right ventricular size is normal. No increase in right ventricular wall thickness. Right ventricular systolic function is normal. There is normal pulmonary artery systolic pressure. The tricuspid regurgitant velocity is 2.70 m/s, and  with an assumed right atrial pressure of 3 mmHg, the estimated right ventricular systolic pressure is 32.2 mmHg. Left Atrium: Left atrial size was normal in size. Right Atrium: Right atrial size was normal in size. Pericardium: Trivial pericardial effusion is present. Mitral Valve: The  mitral valve is normal in structure. Trivial mitral valve regurgitation. No evidence of mitral valve stenosis. Tricuspid Valve: The tricuspid valve is normal in structure. Tricuspid valve regurgitation is trivial. Aortic Valve: The aortic valve is normal in structure. Aortic valve regurgitation is mild. Aortic regurgitation PHT measures 605 msec. No aortic stenosis is present. Pulmonic Valve: The pulmonic valve was normal in structure. Pulmonic valve regurgitation is trivial. No evidence of pulmonic stenosis. Aorta: The aortic root and ascending  aorta are structurally normal, with no evidence of dilitation. IAS/Shunts: The atrial septum is grossly normal.  LEFT VENTRICLE PLAX 2D LVIDd:         3.50 cm  Diastology LVIDs:         2.40 cm  LV e' lateral:   5.87 cm/s LV PW:         1.20 cm  LV E/e' lateral: 9.4 LV IVS:        1.30 cm  LV e' medial:    5.55 cm/s LVOT diam:     2.00 cm  LV E/e' medial:  10.0 LV SV:         71 LV SV Index:   48 LVOT Area:     3.14 cm  RIGHT VENTRICLE RV Basal diam:  2.40 cm RV S prime:     6.09 cm/s TAPSE (M-mode): 2.5 cm LEFT ATRIUM             Index       RIGHT ATRIUM           Index LA diam:        3.30 cm 2.23 cm/m  RA Area:     13.30 cm LA Vol (A2C):   43.2 ml 29.14 ml/m RA Volume:   35.00 ml  23.61 ml/m LA Vol (A4C):   55.1 ml 37.17 ml/m LA Biplane Vol: 50.6 ml 34.14 ml/m  AORTIC VALVE LVOT Vmax:   95.30 cm/s LVOT Vmean:  68.700 cm/s LVOT VTI:    0.227 m AI PHT:      605 msec  AORTA Ao Root diam: 3.20 cm MITRAL VALVE               TRICUSPID VALVE MV Area (PHT): 2.42 cm    TR Peak grad:   29.2 mmHg MV Decel Time: 313 msec    TR Vmax:        270.00 cm/s MV E velocity: 55.30 cm/s MV A velocity: 62.60 cm/s  SHUNTS MV E/A ratio:  0.88        Systemic VTI:  0.23 m                            Systemic Diam: 2.00 cm Kristeen Miss MD Electronically signed by Kristeen Miss MD Signature Date/Time: 01/25/2020/2:23:38 PM    Final    ECHO TEE  Result Date: 01/29/2020    TRANSESOPHOGEAL  ECHO REPORT   Patient Name:   Barbara Hill Date of Exam: 01/29/2020 Medical Rec #:  [id number]      Height:       63.0 in Accession #:    [id number]     Weight:       107.0 lb Date of Birth:  [birth year]       BSA:          1.482 m Patient Age:    79 years       BP:           175/81 mmHg Patient Gender: F              HR:           65 bpm. Exam Location:  Inpatient Procedure: Transesophageal Echo Indications:    thrombus  History:        Patient has prior history of Echocardiogram examinations, most                 recent 01/25/2020. COPD;  Risk Factors:Hypertension and Current                 Smoker. MVP. PSVT.  Sonographer:    Celene Skeen RDCS (AE) Referring Phys: [id number] LAURA R INGOLD PROCEDURE: After discussion of the risks and benefits of a TEE, an informed consent was obtained from the patient. TEE procedure time was 24 minutes. The transesophogeal probe was passed without difficulty through the esophogus of the patient. Imaged were obtained with the patient in a left lateral decubitus position. Sedation performed by different physician. The patient developed no complications during the procedure. IMPRESSIONS  1. Left ventricular ejection fraction, by estimation, is 65 to 70%. The left ventricle has normal function. The left ventricle has no regional wall motion abnormalities.  2. Right ventricular systolic function is normal. The right ventricular size is normal.  3. Left atrial size was mildly dilated. No left atrial/left atrial appendage thrombus was detected. The LAA emptying velocity was 42 cm/s.  4. The mitral valve is grossly normal. Mild mitral valve regurgitation.  5. The aortic valve is normal in structure. Aortic valve regurgitation is mild to moderate.  6. There is mild (Grade II) atheroma plaque involving the transverse and descending aorta.  7. Agitated saline contrast bubble study was negative, with no evidence of any interatrial shunt. Conclusion(s)/Recommendation(s): No LA/LAA thrombus identified.  Negative bubble study for interatrial shunt. No intracardiac source of embolism detected on this on this transesophageal echocardiogram. FINDINGS  Left Ventricle: Left ventricular ejection fraction, by estimation, is 65 to 70%. The left ventricle has normal function. The left ventricle has no regional wall motion abnormalities. The left ventricular internal cavity size was normal in size. Right Ventricle: The right ventricular size is normal. No increase in right ventricular wall thickness. Right ventricular systolic function is normal. Left Atrium: Left atrial size was mildly dilated. No left atrial/left atrial appendage thrombus was detected. The LAA emptying velocity was 42 cm/s. Right Atrium: Right atrial size was normal in size. Pericardium: A small pericardial effusion is present. Mitral Valve: The mitral valve is grossly normal. Mild mitral valve regurgitation. Tricuspid Valve: The tricuspid valve is normal in structure. Tricuspid valve regurgitation is mild. Aortic Valve: The aortic valve is normal in structure. Aortic valve regurgitation is mild to moderate. Pulmonic Valve: The pulmonic valve was normal in structure. Pulmonic valve regurgitation is trivial. Aorta: The aortic root and ascending aorta are structurally normal, with no evidence of dilitation. There is mild (Grade II) atheroma plaque involving the transverse and descending aorta. IAS/Shunts: No atrial level shunt detected by color flow Doppler. Agitated saline contrast was given intravenously to evaluate for intracardiac shunting. Agitated saline contrast bubble study was negative, with no evidence of any interatrial shunt.   AORTA Ao Root diam: 3.10 cm Ao Asc diam:  3.00 cm Weston Brass MD Electronically signed by Weston Brass MD Signature Date/Time: 01/29/2020/9:21:09 PM    Final         The results of significant diagnostics from this hospitalization (including imaging, microbiology, ancillary and laboratory) are listed below for  reference.     Microbiology: No results found for this or any previous visit (from the past 240 hour(s)).   Labs: BNP (last 3 results) No results for input(s): BNP in the last 8760 hours. Basic Metabolic Panel: Recent Labs  Lab 02/03/20 0417 02/04/20 0348 02/07/20 0323 02/08/20 0244 02/09/20 0248  NA 138 135 136 137 136  K 4.4 3.5 3.4* 4.4 4.1  CL 101 98 101 104 100  CO2 GLUCOSE 100* 92 91 95 99  BUN 12 9 7* 7* 9  CREATININE 0.96 0.97 0.99 1.01* 1.17*  CALCIUM 8.8* 8.6* 8.5* 8.9 8.8*  MG  --   --   --  1.4* 2.2  PHOS  --   --   --  3.3 3.9   Liver Function Tests: Recent Labs  Lab 02/08/20 0244 02/09/20 0248  ALBUMIN 2.5* 2.5*   No results for input(s): LIPASE, AMYLASE in the last 168 hours. No results for input(s): AMMONIA in the last 168 hours. CBC: Recent Labs  Lab 02/03/20 1651 02/03/20 1651 02/04/20 0348 02/04/20 0348 02/05/20 0435 02/06/20 0213 02/07/20 0323 02/08/20 0244 02/09/20 0248  WBC 7.5  --  8.7  --  8.1 7.9 6.7  --   --   HGB 8.2*   < > 7.7*   < > 8.6* 8.6* 9.2* 9.2* 9.7*  HCT 26.3*   < > 24.9*   < > 27.4* 27.6* 30.0* 30.0* 31.2*  MCV 89.2  --  89.2  --  89.5 90.8 92.3  --   --   PLT 346  --  331  --  383 374 401*  --   --    < > = values in this interval not displayed.   Cardiac Enzymes: No results for input(s): CKTOTAL, CKMB, CKMBINDEX, TROPONINI in the last 168 hours. BNP: Invalid input(s): POCBNP CBG: No results for input(s): GLUCAP in the last 168 hours. D-Dimer No results for input(s): DDIMER in the last 72 hours. Hgb A1c No results for input(s): HGBA1C in the last 72 hours. Lipid Profile No results for input(s): CHOL, HDL, LDLCALC, TRIG, CHOLHDL, LDLDIRECT in the last 72 hours. Thyroid function studies No results for input(s): TSH, T4TOTAL, T3FREE, THYROIDAB in the last 72 hours.  Invalid input(s): FREET3 Anemia work up No results for input(s): VITAMINB12, FOLATE, FERRITIN, TIBC, IRON, RETICCTPCT in the last  72 hours. Urinalysis    Component Value Date/Time   COLORURINE AMBER (A) 01/28/2020 1120   APPEARANCEUR CLOUDY (A) 01/28/2020 1120   APPEARANCEUR Clear 09/08/2012 0030   LABSPEC 1.015 01/28/2020 1120   LABSPEC 1.013 09/08/2012 0030   PHURINE 5.0 01/28/2020 1120   GLUCOSEU NEGATIVE 01/28/2020 1120   GLUCOSEU Negative 09/08/2012 0030   HGBUR LARGE (A) 01/28/2020 1120   BILIRUBINUR NEGATIVE 01/28/2020 1120   BILIRUBINUR Negative 09/08/2012 0030   KETONESUR NEGATIVE 01/28/2020 1120   PROTEINUR 100 (A) 01/28/2020 1120   UROBILINOGEN 0.2 01/18/2015 2006   NITRITE NEGATIVE 01/28/2020 1120   LEUKOCYTESUR TRACE (A) 01/28/2020 1120   LEUKOCYTESUR Negative 09/08/2012 0030   Sepsis Labs Invalid input(s): PROCALCITONIN,  WBC,  LACTICIDVEN   Time coordinating discharge: 35 minutes  SIGNED:  Almon Hercules, MD  [facility] 02/09/2020, 8:15 AM  If 7PM-7AM, please contact night-coverage [email or web address] Password [email or web address]

## 2020-02-09 NOTE — Progress Notes (Signed)
Physical Therapy Treatment Patient Details Name: Barbara Hill MRN: [id number] DOB: 1941/05/29 Today's Date: 02/09/2020    History of Present Illness Patient is a 79 y/o female who presented with acute ischemic lower extremities now s/p bilateral popliteal exploration and embolectomy 01/25/20. 24 hours later developed compartment syndrome and went for bilateral 4 compartment fasciotomy 01/26/20.  Has some post op confusion.  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    PT Comments    Pt complaining of significant L posterior calf and foot pain, but agreeable to transfer training. Pt required min-mod assist for bed mobility and transfer to recliner at bedside via small pivotal steps. Pt very reluctant to bear weight on LLE, states "it hurts so bad it makes me nauseous". PT took pt through LE exercises and L heel cord stretching to address weakness and tightness, respectively. Pt progressing very slowly, continuing to recommend SNF.    Follow Up Recommendations  Supervision/Assistance - 24 hour;SNF     Equipment Recommendations  Rolling walker with 5" wheels    Recommendations for Other Services       Precautions / Restrictions Precautions Precautions: Fall    Mobility  Bed Mobility Overal bed mobility: Needs Assistance Bed Mobility: Supine to Sit     Supine to sit: Min assist     General bed mobility comments: min assist for trunk elevation, scooting to EOB; pt able to bring LEs to EOB in step-wise fashion and with increased time  Transfers Overall transfer level: Needs assistance Equipment used: Rolling walker (2 wheeled) Transfers: Sit to/from [facility] Sit to Stand: Mod assist;From elevated surface Stand pivot transfers: Mod assist;From elevated surface       General transfer comment: Sit to stand x2, first attempt pt quickly sat without warning due to L foot pain. mod assist for power  up, hip extension, and steadying upon standing. Mod assist for stand pivot to recliner for correcting posterior leaning, steadying, guiding pt and RW, and slow eccentric lower into chair.  Ambulation/Gait             General Gait Details: unable - pt able to take pivotal steps only   Stairs             Wheelchair Mobility    Modified Rankin (Stroke Patients Only)       Balance Overall balance assessment: Needs assistance Sitting-balance support: Feet supported;Single extremity supported Sitting balance-Leahy Scale: Fair     Standing balance support: Bilateral upper extremity supported Standing balance-Leahy Scale: Poor                              Cognition Arousal/Alertness: Awake/alert Behavior During Therapy: WFL for tasks assessed/performed Overall Cognitive Status: Within Functional Limits for tasks assessed                                        Exercises General Exercises - Lower Extremity Ankle Circles/Pumps: AROM;Both;20 reps;Seated Long Arc Quad: AROM;Both;Seated;10 reps Other Exercises Other Exercises: contract relax L plantarflexors stretch, x3 with sustained hold 15-30 seconds based on pt tolerance. Pt lacks ~15* LLE DF    General Comments        Pertinent Vitals/Pain Pain Assessment: Faces Faces Pain Scale: Hurts little more Pain Location: L foot and ankle Pain  Descriptors / Indicators: Sore;Discomfort Pain Intervention(s): Limited activity within patient's tolerance;Monitored during session;Repositioned    Home Living                      Prior Function            PT Goals (current goals can now be found in the care plan section) Acute Rehab PT Goals Patient Stated Goal: to go home PT Goal Formulation: With patient Time For Goal Achievement: 02/21/20 Potential to Achieve Goals: Fair Progress towards PT goals: Progressing toward goals    Frequency    Min 2X/week      PT Plan Current  plan remains appropriate    Co-evaluation              AM-PAC PT "6 Clicks" Mobility   Outcome Measure  Help needed turning from your back to your side while in a flat bed without using bedrails?: A Little Help needed moving from lying on your back to sitting on the side of a flat bed without using bedrails?: A Little Help needed moving to and from a bed to a chair (including a wheelchair)?: A Lot Help needed standing up from a chair using your arms (e.g., wheelchair or bedside chair)?: A Lot Help needed to walk in hospital room?: Total Help needed climbing 3-5 steps with a railing? : Total 6 Click Score: 12    End of Session Equipment Utilized During Treatment: Gait belt Activity Tolerance: Patient limited by pain;Patient limited by fatigue Patient left: with call bell/phone within reach;in chair;with chair alarm set;with nursing/sitter in room Nurse Communication: Mobility status PT Visit Diagnosis: Other abnormalities of gait and mobility (R26.89);Muscle weakness (generalized) (M62.81);Difficulty in walking, not elsewhere classified (R26.2);Pain Pain - Right/Left: Left Pain - part of body: Leg     Time: 1050-1108 PT Time Calculation (min) (ACUTE ONLY): 18 min  Charges:  $Therapeutic Activity: 8-22 mins                    Barbara Hill E, PT Acute Rehabilitation Services Pager [phone]  Office [phone]    Barbara Hill D Despina Hidden 02/09/2020, 12:16 PM

## 2020-02-09 NOTE — TOC Progression Note (Signed)
Transition of Care [facility]) - Progression Note    Patient Details  Name: Barbara Hill MRN: [id number] Date of Birth: 1941/06/03  [facility]) CM/SW Contact  Eduard Roux, [place] Phone Number: 02/09/2020, 5:49 PM  Clinical Narrative:     Patient's insurance still pending.   MD and RN updated  Antony Blackbird, MSW, LCSWA Clinical Social Worker   Expected Discharge Plan: Skilled Nursing Facility Barriers to Discharge: Continued Medical Work up, SNF Pending bed offer  Expected Discharge Plan and Services Expected Discharge Plan: Skilled Nursing Facility In-house Referral: Clinical Social Work       Expected Discharge Date: 02/09/20                                     Social Determinants of Health (SDOH) Interventions    Readmission Risk Interventions No flowsheet data found.

## 2020-02-10 DIAGNOSIS — T79A29A Traumatic compartment syndrome of unspecified lower extremity, initial encounter: Secondary | ICD-10-CM

## 2020-02-10 DIAGNOSIS — E43 Unspecified severe protein-calorie malnutrition: Secondary | ICD-10-CM

## 2020-02-10 NOTE — Progress Notes (Signed)
02/10/2020 2:15 PM Called nurse at [facility]- report given. Kathryne Hitch

## 2020-02-10 NOTE — Progress Notes (Signed)
02/10/2020 2:13 PM Pt discharged to [facility] via [facility].  Pt's daughter in room and has her belongings. Kathryne Hitch

## 2020-02-10 NOTE — TOC Progression Note (Signed)
Transition of Care [facility]) - Progression Note    Patient Details  Name: Barbara Hill MRN: [id number] Date of Birth: 1941-06-13  Transition of Care [facility]) CM/SW Contact  Patrice Paradise, LCSW Phone Number:[phone] 02/10/2020, 10:47 AM  Clinical Narrative:     Patient will DC to:?[facility] Anticipated DC date:?02/10/20 Family notified:?[facility] EG:[facility]   Per MD patient ready for DC to [facility] RN, patient, patient's family, and facility notified of DC. Discharge Summary sent to facility. RN given number for report  [phone] RN Norma room 34. DC packet on chart. Ambulance transport requested for patient.   CSW signing off.   Judd Lien, [place] [phone]   Expected Discharge Plan: Skilled Nursing Facility Barriers to Discharge: No Barriers Identified  Expected Discharge Plan and Services Expected Discharge Plan: Skilled Nursing Facility In-house Referral: Clinical Social Work       Expected Discharge Date: 02/10/20                                     Social Determinants of Health (SDOH) Interventions    Readmission Risk Interventions No flowsheet data found.

## 2020-02-10 NOTE — Discharge Summary (Signed)
Physician Discharge Summary  Barbara Hill JXB:[id number] DOB: [birth year] DOA: 01/24/2020  PCP: Aletha Halim., PA-C  Admit date: 01/24/2020 Discharge date: 02/10/2020  Admitted From: Home Disposition: SNF  Recommendations for Outpatient Follow-up:  1. Follow ups as below. 2. Please obtain CBC/BMP/Mag at follow up 3. Please follow up on the following pending results: None   Discharge Condition: Stable CODE STATUS: Full code   Follow-up Information    Early, Arvilla Meres, MD In 3 weeks.   Specialties: Vascular Surgery, Cardiology Why: Office will call you to arrange your appt (sent) Contact information: [place] [address] [phone]        Fay Records, MD. Schedule an appointment as soon as possible for a visit in 3 week(s).   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  She underwent bilateral popliteal exploration and embolectomy by Dr. Donnetta Hutching on 01/25/2020.  She then underwent bilateral fasciotomy on 01/26/2020. TEE negative for cardiac thrombus. Started on Eliquis for anticoagulation.  Patient continues to do well.  Had good right DP pulse and left PT pulses.  Staples removed by vascular surgery.  Surgical wound remained clean.  Evaluated by therapy who recommended SNF.  Patient discharged yesterday but stayed overnight pending insurance authorization.  No major events overnight or this morning.  Feels even better today.   See individual problem list below for more hospital course.  Discharge Diagnoses:  Multiple ischemic legs in the setting of bilateral popliteal embolus Bilateral lower extremity compartment syndrome -No clear source  of embolus.  TEE negative for PFO or thrombus. -Bilateral popliteal exploration and embolectomy on 6/3 by Dr. Donnetta Hutching -Bilateral fasciotomies on 6/4 by Dr. Donnetta Hutching Jodell Cipro removed on 6/16 -Continue Eliquis anticoagulation -Good right DP and left PT pulses with brisk cap refills in toes -Encouraged tobacco cessation. -Outpatient follow-up with vascular surgery as above  Orthostatic hypotension in patient with history of essential hypertension:  Improved. -Discontinued home losartan. -Could benefit from compression socks or TED hose once surgical wound heals completely  Paroxysmal SVT: Stable -Continue metoprolol and flecainide  Hypokalemia/hypomagnesemia: Resolved.  Chronic COPD/tobacco use-coughing intermittently but no respiratory distress. -Continue breathing treatments -Encouraged tobacco cessation   Acute metabolic encephalopathy-multifactorial including UTI.  Resolved.  Enterococcus UTI with hematuria: Resolved. -Completed antibiotic course.  ABLA: Likely from surgical loss.  Baseline Hgb~10> 10 (admit)> 6.43u>>>> 9.2>9.7.  Iron sat 6%.  TIBC 246.  Ferritin 97.  FOBT negative.  Large blood on UA. -P.o. ferrous sulfate twice daily with bowel regimen -Recheck CBC in 1 week  Hematuria in the setting of UTI-felt to be due to UTI.  Renal ultrasound negative. -Urology recommended outpatient follow-up for hematuria work-up after discharge.  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  No focal neuro deficits -She was on Plavix prior to admission which is discontinued due risk for bleeding with Eliquis.  Chronic kidney disease stage IIIA: Baseline 1.2-1.3> 0.99> 1.01>1.17.  Stable. -Discontinue losartan. -Recheck renal function in 1 week  Anxiety: Stable -Continue home Prozac and as needed Xanax  GERD -Continue PPI  Severe malnutrition: Significant muscle mass and subcu fat loss. Body mass  index is 19.6 kg/m. Nutrition  Problem: Severe Malnutrition Etiology: chronic illness (COPD) Signs/Symptoms: severe fat depletion, severe muscle depletion Interventions: Ensure Enlive (each supplement provides 350kcal and 20 grams of protein), MVI      Discharge Exam: Vitals:   02/10/20 0336 02/10/20 0735  BP:  (!) 143/58  Pulse: (!) 57 (!) 56  Resp: 14 15  Temp: 98.2 F (36.8 C) 99 F (37.2 C)  SpO2: 95% 99%    GENERAL: No apparent distress.  Nontoxic. HEENT: MMM.  Vision and hearing grossly intact.  NECK: Supple.  No apparent JVD.  RESP:  No IWOB.  Fair aeration bilaterally. CVS:  RRR. Heart sounds normal.  ABD/GI/GU: Bowel sounds present. Soft. Non tender.  MSK/EXT:  Moves extremities.  Right DP and left PT pulses palpable.  Cap refill is brisk. SKIN: Fasciotomy wounds on either side of both legs appears clean and dry. NEURO: Awake, alert and oriented appropriately.  No apparent focal neuro deficit. PSYCH: Calm. Normal affect.  Discharge Instructions  Discharge Instructions    Call MD for:  difficulty breathing, headache or visual disturbances   Complete by: As directed    Call MD for:  extreme fatigue   Complete by: As directed    Call MD for:  persistant nausea and vomiting   Complete by: As directed    Call MD for:  redness, tenderness, or signs of infection (pain, swelling, redness, odor or green/yellow discharge around incision site)   Complete by: As directed    Call MD for:  severe uncontrolled pain   Complete by: As directed    Call MD for:  temperature >100.4   Complete by: As directed    Diet - low sodium heart healthy   Complete by: As directed    Increase activity slowly   Complete by: As directed    No dressing needed   Complete by: As directed      Allergies as of 02/10/2020      Reactions   Dobutamine Other (See Comments)   Heart beating hard with CP, neck pain, and weakness   Epinephrine Other (See Comments)   Fast heart beat   Darvocet [propoxyphene N-acetaminophen]  Nausea And Vomiting   Excedrin Extra Strength [aspirin-acetaminophen-caffeine] Nausea And Vomiting   Aspirin    Clarithromycin Other (See Comments)   Yeast infection   Doxycycline Other (See Comments)   Makes stomach hurt   Penicillins Other (See Comments)   Yeast infection   Prednisone Other (See Comments)   Shakes   Propoxyphene Nausea And Vomiting      Medication List    STOP taking these medications   clopidogrel 75 MG tablet Commonly known as: PLAVIX   losartan 25 MG tablet Commonly known as: COZAAR     TAKE these medications   acetaminophen 650 MG CR tablet Commonly known as: TYLENOL Take 1,300 mg by mouth every 8 (eight) hours as needed for pain.   ALPRAZolam 0.5 MG tablet Commonly known as: XANAX Take 1 tablet (0.5 mg total) by mouth 3 (three) times daily as needed for anxiety. What changed:   when to take this  reasons to take this   apixaban 5 MG Tabs tablet Commonly known as: ELIQUIS Take 1 tablet (5 mg total) by mouth 2 (two) times daily.   butalbital-acetaminophen-caffeine 50-325-40 MG tablet Commonly known as: FIORICET Take 1 tablet by mouth every 8 (eight) hours as needed for headache or migraine. What changed:   how much to take  when to  take this   cholecalciferol 1000 units tablet Commonly known as: VITAMIN D Take 1,000 Units by mouth daily.   cyanocobalamin 1000 MCG tablet Take 1 tablet (1,000 mcg total) by mouth daily.   feeding supplement (ENSURE ENLIVE) Liqd Take 237 mLs by mouth 2 (two) times daily between meals.   ferrous sulfate 325 (65 FE) MG tablet Take 1 tablet (325 mg total) by mouth 2 (two) times daily with a meal.   flecainide 50 MG tablet Commonly known as: TAMBOCOR Take 1 tablet (50 mg total) by mouth 2 (two) times daily.   FLUoxetine 20 MG capsule Commonly known as: PROZAC Take 20 mg by mouth 2 (two) times daily.   fluticasone 50 MCG/ACT nasal spray Commonly known as: FLONASE Place 1 spray into both nostrils  daily as needed for allergies or rhinitis.   metoprolol succinate 25 MG 24 hr tablet Commonly known as: TOPROL-XL TAKE 1 TABLET BY MOUTH EVERYDAY AT BEDTIME What changed:   how much to take  how to take this  when to take this  additional instructions   multivitamin with minerals Tabs tablet Take 1 tablet by mouth daily.   omeprazole 40 MG capsule Commonly known as: PRILOSEC Take 40 mg by mouth daily.   oxyCODONE 5 MG immediate release tablet Commonly known as: Oxy IR/ROXICODONE Take 1 tablet (5 mg total) by mouth every 8 (eight) hours as needed for up to 5 days for moderate pain.   polyethylene glycol powder 17 GM/SCOOP powder Commonly known as: MiraLax Take 17 g by mouth 2 (two) times daily as needed for moderate constipation.   PROBIOTIC PO Take liquid probiotic by mouth twice daily (Activia)   senna-docusate 8.6-50 MG tablet Commonly known as: Senokot-S Take 1 tablet by mouth 2 (two) times daily between meals as needed for mild constipation.            Discharge Care Instructions  (From admission, onward)         Start     Ordered   02/09/20 0000  No dressing needed        02/09/20 1610          Consultations:  Vascular surgery  Procedures/Studies: 6/3-bilateral popliteal exploration and embolectomy.  S 6/4-bilateral fasciotomy on 01/26/2020 6/7-TEE without cardiac thrombus or PFO.   CT Angio Aortobifemoral W and/or Wo Contrast  Result Date: 01/25/2020 CLINICAL DATA:  Right lower leg pain and decreased pulses EXAM: CT ANGIOGRAPHY OF ABDOMINAL AORTA WITH ILIOFEMORAL RUNOFF TECHNIQUE: Multidetector CT imaging of the abdomen, pelvis and lower extremities was performed using the standard protocol during bolus administration of intravenous contrast. Multiplanar CT image reconstructions and MIPs were obtained to evaluate the vascular anatomy. CONTRAST:  75mL OMNIPAQUE IOHEXOL 350 MG/ML SOLN COMPARISON:  None. FINDINGS: VASCULAR Aorta: Abdominal aorta  demonstrates mild atherosclerotic calcifications without aneurysmal dilatation. Celiac: Patent without evidence of aneurysm, dissection, vasculitis or significant stenosis. SMA: Patent without evidence of aneurysm, dissection, vasculitis or significant stenosis. Renals: Single renal arteries are identified bilaterally and widely patent. IMA: Patent without evidence of aneurysm, dissection, vasculitis or significant stenosis. RIGHT Lower Extremity Inflow: Common iliac, external iliac and internal iliac arteries are widely patent. No aneurysmal dilatation is seen. Runoff: Common femoral artery and femoral bifurcation are within normal limits. Profundus femoral artery is occluded shortly after its origin. The superficial femoral artery is within normal limits. Mild atherosclerotic calcifications are noted. The popliteal artery is patent proximally however occludes just beyond the knee joint. There is reconstitution of the anterior  tibial artery just beyond its origin. The distal anterior tibial artery is not well visualized due to timing of contrast bolus. The tibioperoneal trunk reconstitutes shortly after its origin with evidence of diffuse atherosclerotic calcifications. The peroneal artery is diminutive. The posterior tibial artery continues to the level of the foot. LEFT Lower Extremity Inflow: Common iliac artery, internal and external iliac arteries are patent. Runoff: Common femoral artery and femoral bifurcation are patent. Superficial femoral artery is widely patent with mild atherosclerotic calcifications. The popliteal artery is patent throughout its course with the exception of the distal most aspect where there is a filling defect identified suspicious for embolus. Anterior tibial artery and tibioperoneal trunk are visualized just beyond this filling defect and patent. The anterior tibial artery continues into the mid calf and is diminutive distally. Primary runoff is noted via the posterior tibial artery  to the level of the ankle and subsequently into the foot. The peroneal artery is diminutive. Veins: No specific venous abnormality is noted. Review of the MIP images confirms the above findings. NON-VASCULAR Lower chest: Mild bibasilar atelectasis is noted right greater than left. Hepatobiliary: No focal liver abnormality is seen. Status post cholecystectomy. No biliary dilatation. Pancreas: Unremarkable. No pancreatic ductal dilatation or surrounding inflammatory changes. Spleen: Normal in size without focal abnormality. Adrenals/Urinary Tract: Adrenal glands are within normal limits bilaterally. Kidneys demonstrate a normal enhancement pattern. Bladder is partially distended. Stomach/Bowel: Diverticular change of the colon is noted without evidence of diverticulitis. No obstructive or inflammatory change of the bowel is seen. The appendix is not visualized consistent with a prior surgical history. Lymphatic: No specific lymphadenopathy is noted. Reproductive: Uterus has been surgically removed. No adnexal mass is noted. Other: No abdominal wall hernia or abnormality. No abdominopelvic ascites. Musculoskeletal: No acute or significant osseous findings. IMPRESSION: VASCULAR Atherosclerotic calcifications are noted throughout the arterial tree. Short segment occlusion of the right distal popliteal artery is noted with reconstitution of the anterior tibial artery and tibioperoneal trunk as described above. Dominant runoff is noted via the posterior tibial artery. Short segment occlusion versus high-grade stenosis of the left popliteal artery distally which appears to be related to a focal embolus. Reconstitution of the anterior tibial and tibioperoneal trunk is seen with dominant runoff via the posterior tibial artery. NON-VASCULAR Diverticular change without diverticulitis. Basilar atelectasis right greater than left. No acute abnormality noted. Electronically Signed   By: Alcide CleverMark  Lukens M.D.   On: 01/25/2020 02:59    US RENAL  Result Date: 02/01/2020 CLINICAL DATA:  79 year old female with hematuria. EXAM: RENAL / URINARY TRACT ULTRASOUND COMPLETE COMPARISON:  01/25/2020 CT prior studies FINDINGS: Right Kidney: Renal measurements: 8.9 x 3.7 x 5 cm = volume: 85 mL. A 1.9 cm cyst within the mid RIGHT kidney is again noted. No solid mass or hydronephrosis. Renal echogenicity is UPPER limits of normal. Cortical thinning is present. Left Kidney: Renal measurements: 9.6 x 4.8 x 5.4 cm = volume: 130 mL. A 1.3 cm UPPER pole cyst is present. No solid mass or hydronephrosis. Renal echogenicity is UPPER limits normal. Cortical thinning is noted. Bladder: Appears normal for degree of bladder distention. Other: None. IMPRESSION: 1. Bilateral renal cortical thinning and UPPER limits of normal renal echogenicity which may be seen with medical renal disease. 2. No findings on this study to suggest a cause for this patient's hematuria. Clinical follow-up recommended. Electronically Signed   By: Harmon PierJeffrey  Hu M.D.   On: 02/01/2020 19:30   DG CHEST PORT 1 VIEW  Result Date:  01/28/2020 CLINICAL DATA:  Altered level of consciousness, shortness of breath, post BILATERAL popliteal artery embolectomy 01/26/2020 EXAM: PORTABLE CHEST 1 VIEW COMPARISON:  Portable exam 1030 hours compared to 03/23/2015 FINDINGS: Borderline enlargement of cardiac silhouette. Mediastinal contours and pulmonary vascularity normal. Emphysematous and bronchitic changes consistent with COPD. Minimal bibasilar atelectasis. Remaining lungs clear. No infiltrate, pleural effusion, or pneumothorax. Bones demineralized. IMPRESSION: Changes of COPD with minimal bibasilar atelectasis. Electronically Signed   By: Ulyses Southward M.D.   On: 01/28/2020 12:25   ECHOCARDIOGRAM COMPLETE  Result Date: 01/25/2020    ECHOCARDIOGRAM REPORT   Patient Name:   GUILA OWENSBY Date of Exam: 01/25/2020 Medical Rec #:  [id number]      Height:       63.0 in Accession #:    [id number]     Weight:        107.0 lb Date of Birth:  04/12/1941       BSA:          1.482 m Patient Age:    78 years       BP:           112/50 mmHg Patient Gender: F              HR:           66 bpm. Exam Location:  Inpatient Procedure: 2D Echo, Pediatric Echo and Cardiac Doppler Indications:    Embolism  History:        Patient has prior history of Echocardiogram examinations, most                 recent 04/01/2016. COPD and Stroke, Arrythmias:Atrial Fibrillation                 and SVT; Risk Factors:Hypertension and Current Smoker. S/p                 Popliteal surgery this.  Sonographer:    Lavenia Atlas Referring Phys: [phone] RONDELL A SMITH IMPRESSIONS  1. Left ventricular ejection fraction, by estimation, is 60 to 65%. The left ventricle has normal function. The left ventricle has no regional wall motion abnormalities. There is mild left ventricular hypertrophy. Left ventricular diastolic parameters are consistent with Grade II diastolic dysfunction (pseudonormalization).  2. Right ventricular systolic function is normal. The right ventricular size is normal. There is normal pulmonary artery systolic pressure.  3. The mitral valve is normal in structure. Trivial mitral valve regurgitation. No evidence of mitral stenosis.  4. The aortic valve is normal in structure. Aortic valve regurgitation is mild. No aortic stenosis is present. FINDINGS  Left Ventricle: Left ventricular ejection fraction, by estimation, is 60 to 65%. The left ventricle has normal function. The left ventricle has no regional wall motion abnormalities. The left ventricular internal cavity size was normal in size. There is  mild left ventricular hypertrophy. Left ventricular diastolic parameters are consistent with Grade II diastolic dysfunction (pseudonormalization). Right Ventricle: The right ventricular size is normal. No increase in right ventricular wall thickness. Right ventricular systolic function is normal. There is normal pulmonary artery systolic pressure.  The tricuspid regurgitant velocity is 2.70 m/s, and  with an assumed right atrial pressure of 3 mmHg, the estimated right ventricular systolic pressure is 32.2 mmHg. Left Atrium: Left atrial size was normal in size. Right Atrium: Right atrial size was normal in size. Pericardium: Trivial pericardial effusion is present. Mitral Valve: The mitral valve is normal in structure. Trivial mitral valve regurgitation. No evidence of  mitral valve stenosis. Tricuspid Valve: The tricuspid valve is normal in structure. Tricuspid valve regurgitation is trivial. Aortic Valve: The aortic valve is normal in structure. Aortic valve regurgitation is mild. Aortic regurgitation PHT measures 605 msec. No aortic stenosis is present. Pulmonic Valve: The pulmonic valve was normal in structure. Pulmonic valve regurgitation is trivial. No evidence of pulmonic stenosis. Aorta: The aortic root and ascending aorta are structurally normal, with no evidence of dilitation. IAS/Shunts: The atrial septum is grossly normal.  LEFT VENTRICLE PLAX 2D LVIDd:         3.50 cm  Diastology LVIDs:         2.40 cm  LV e' lateral:   5.87 cm/s LV PW:         1.20 cm  LV E/e' lateral: 9.4 LV IVS:        1.30 cm  LV e' medial:    5.55 cm/s LVOT diam:     2.00 cm  LV E/e' medial:  10.0 LV SV:         71 LV SV Index:   48 LVOT Area:     3.14 cm  RIGHT VENTRICLE RV Basal diam:  2.40 cm RV S prime:     6.09 cm/s TAPSE (M-mode): 2.5 cm LEFT ATRIUM             Index       RIGHT ATRIUM           Index LA diam:        3.30 cm 2.23 cm/m  RA Area:     13.30 cm LA Vol (A2C):   43.2 ml 29.14 ml/m RA Volume:   35.00 ml  23.61 ml/m LA Vol (A4C):   55.1 ml 37.17 ml/m LA Biplane Vol: 50.6 ml 34.14 ml/m  AORTIC VALVE LVOT Vmax:   95.30 cm/s LVOT Vmean:  68.700 cm/s LVOT VTI:    0.227 m AI PHT:      605 msec  AORTA Ao Root diam: 3.20 cm MITRAL VALVE               TRICUSPID VALVE MV Area (PHT): 2.42 cm    TR Peak grad:   29.2 mmHg MV Decel Time: 313 msec    TR Vmax:         270.00 cm/s MV E velocity: 55.30 cm/s MV A velocity: 62.60 cm/s  SHUNTS MV E/A ratio:  0.88        Systemic VTI:  0.23 m                            Systemic Diam: 2.00 cm Kristeen Miss MD Electronically signed by Kristeen Miss MD Signature Date/Time: 01/25/2020/2:23:38 PM    Final    ECHO TEE  Result Date: 01/29/2020    TRANSESOPHOGEAL ECHO REPORT   Patient Name:   Barbara Hill Date of Exam: 01/29/2020 Medical Rec #:  [id number]      Height:       63.0 in Accession #:    [id number]     Weight:       107.0 lb Date of Birth:  Jan 19, 1941       BSA:          1.482 m Patient Age:    79 years       BP:           175/81 mmHg Patient Gender: F  HR:           65 bpm. Exam Location:  Inpatient Procedure: Transesophageal Echo Indications:    thrombus  History:        Patient has prior history of Echocardiogram examinations, most                 recent 01/25/2020. COPD; Risk Factors:Hypertension and Current                 Smoker. MVP. PSVT.  Sonographer:    Celene Skeen RDCS (AE) Referring Phys: [id number] LAURA R INGOLD PROCEDURE: After discussion of the risks and benefits of a TEE, an informed consent was obtained from the patient. TEE procedure time was 24 minutes. The transesophogeal probe was passed without difficulty through the esophogus of the patient. Imaged were obtained with the patient in a left lateral decubitus position. Sedation performed by different physician. The patient developed no complications during the procedure. IMPRESSIONS  1. Left ventricular ejection fraction, by estimation, is 65 to 70%. The left ventricle has normal function. The left ventricle has no regional wall motion abnormalities.  2. Right ventricular systolic function is normal. The right ventricular size is normal.  3. Left atrial size was mildly dilated. No left atrial/left atrial appendage thrombus was detected. The LAA emptying velocity was 42 cm/s.  4. The mitral valve is grossly normal. Mild mitral valve regurgitation.  5. The  aortic valve is normal in structure. Aortic valve regurgitation is mild to moderate.  6. There is mild (Grade II) atheroma plaque involving the transverse and descending aorta.  7. Agitated saline contrast bubble study was negative, with no evidence of any interatrial shunt. Conclusion(s)/Recommendation(s): No LA/LAA thrombus identified. Negative bubble study for interatrial shunt. No intracardiac source of embolism detected on this on this transesophageal echocardiogram. FINDINGS  Left Ventricle: Left ventricular ejection fraction, by estimation, is 65 to 70%. The left ventricle has normal function. The left ventricle has no regional wall motion abnormalities. The left ventricular internal cavity size was normal in size. Right Ventricle: The right ventricular size is normal. No increase in right ventricular wall thickness. Right ventricular systolic function is normal. Left Atrium: Left atrial size was mildly dilated. No left atrial/left atrial appendage thrombus was detected. The LAA emptying velocity was 42 cm/s. Right Atrium: Right atrial size was normal in size. Pericardium: A small pericardial effusion is present. Mitral Valve: The mitral valve is grossly normal. Mild mitral valve regurgitation. Tricuspid Valve: The tricuspid valve is normal in structure. Tricuspid valve regurgitation is mild. Aortic Valve: The aortic valve is normal in structure. Aortic valve regurgitation is mild to moderate. Pulmonic Valve: The pulmonic valve was normal in structure. Pulmonic valve regurgitation is trivial. Aorta: The aortic root and ascending aorta are structurally normal, with no evidence of dilitation. There is mild (Grade II) atheroma plaque involving the transverse and descending aorta. IAS/Shunts: No atrial level shunt detected by color flow Doppler. Agitated saline contrast was given intravenously to evaluate for intracardiac shunting. Agitated saline contrast bubble study was negative, with no evidence of any  interatrial shunt.   AORTA Ao Root diam: 3.10 cm Ao Asc diam:  3.00 cm Weston Brass MD Electronically signed by Weston Brass MD Signature Date/Time: 01/29/2020/9:21:09 PM    Final        The results of significant diagnostics from this hospitalization (including imaging, microbiology, ancillary and laboratory) are listed below for reference.     Microbiology: Recent Results (from the past 240 hour(s))  SARS  CORONAVIRUS 2 (TAT 6-24 HRS) Nasopharyngeal Nasopharyngeal Swab     Status: None   Collection Time: 02/09/20 11:26 AM   Specimen: Nasopharyngeal Swab  Result Value Ref Range Status   SARS Coronavirus 2 NEGATIVE NEGATIVE Final    Comment: (NOTE) SARS-CoV-2 target nucleic acids are NOT DETECTED.  The SARS-CoV-2 RNA is generally detectable in upper and lower respiratory specimens during the acute phase of infection. Negative results do not preclude SARS-CoV-2 infection, do not rule out co-infections with other pathogens, and should not be used as the sole basis for treatment or other patient management decisions. Negative results must be combined with clinical observations, patient history, and epidemiological information. The expected result is Negative.  Fact Sheet for Patients: HairSlick.no  Fact Sheet for Healthcare Providers: quierodirigir.com  This test is not yet approved or cleared by the [place] FDA and  has been authorized for detection and/or diagnosis of SARS-CoV-2 by FDA under an Emergency Use Authorization (EUA). This EUA will remain  in effect (meaning this test can be used) for the duration of the COVID-19 declaration under Se ction 564(b)(1) of the Act, 21 U.S.C. section 360bbb-3(b)(1), unless the authorization is terminated or revoked sooner.  Performed at [facility], [address] [place], [place] [address]      Labs: BNP (last 3 results) No results for input(s): BNP in the  last 8760 hours. Basic Metabolic Panel: Recent Labs  Lab 02/04/20 0348 02/07/20 0323 02/08/20 0244 02/09/20 0248  NA 135 136 137 136  K 3.5 3.4* 4.4 4.1  CL 98 101 104 100  CO2 27 27 27 28   GLUCOSE 92 91 95 99  BUN 9 7* 7* 9  CREATININE 0.97 0.99 1.01* 1.17*  CALCIUM 8.6* 8.5* 8.9 8.8*  MG  --   --  1.4* 2.2  PHOS  --   --  3.3 3.9   Liver Function Tests: Recent Labs  Lab 02/08/20 0244 02/09/20 0248  ALBUMIN 2.5* 2.5*   No results for input(s): LIPASE, AMYLASE in the last 168 hours. No results for input(s): AMMONIA in the last 168 hours. CBC: Recent Labs  Lab 02/03/20 1651 02/03/20 1651 02/04/20 0348 02/04/20 0348 02/05/20 0435 02/06/20 0213 02/07/20 0323 02/08/20 0244 02/09/20 0248  WBC 7.5  --  8.7  --  8.1 7.9 6.7  --   --   HGB 8.2*   < > 7.7*   < > 8.6* 8.6* 9.2* 9.2* 9.7*  HCT 26.3*   < > 24.9*   < > 27.4* 27.6* 30.0* 30.0* 31.2*  MCV 89.2  --  89.2  --  89.5 90.8 92.3  --   --   PLT 346  --  331  --  383 374 401*  --   --    < > = values in this interval not displayed.   Cardiac Enzymes: No results for input(s): CKTOTAL, CKMB, CKMBINDEX, TROPONINI in the last 168 hours. BNP: Invalid input(s): POCBNP CBG: No results for input(s): GLUCAP in the last 168 hours. D-Dimer No results for input(s): DDIMER in the last 72 hours. Hgb A1c No results for input(s): HGBA1C in the last 72 hours. Lipid Profile No results for input(s): CHOL, HDL, LDLCALC, TRIG, CHOLHDL, LDLDIRECT in the last 72 hours. Thyroid function studies No results for input(s): TSH, T4TOTAL, T3FREE, THYROIDAB in the last 72 hours.  Invalid input(s): FREET3 Anemia work up No results for input(s): VITAMINB12, FOLATE, FERRITIN, TIBC, IRON, RETICCTPCT in the last 72 hours. Urinalysis  Component Value Date/Time   COLORURINE AMBER (A) 01/28/2020 1120   APPEARANCEUR CLOUDY (A) 01/28/2020 1120   APPEARANCEUR Clear 09/08/2012 0030   LABSPEC 1.015 01/28/2020 1120   LABSPEC 1.013 09/08/2012  0030   PHURINE 5.0 01/28/2020 1120   GLUCOSEU NEGATIVE 01/28/2020 1120   GLUCOSEU Negative 09/08/2012 0030   HGBUR LARGE (A) 01/28/2020 1120   BILIRUBINUR NEGATIVE 01/28/2020 1120   BILIRUBINUR Negative 09/08/2012 0030   KETONESUR NEGATIVE 01/28/2020 1120   PROTEINUR 100 (A) 01/28/2020 1120   UROBILINOGEN 0.2 01/18/2015 2006   NITRITE NEGATIVE 01/28/2020 1120   LEUKOCYTESUR TRACE (A) 01/28/2020 1120   LEUKOCYTESUR Negative 09/08/2012 0030   Sepsis Labs Invalid input(s): PROCALCITONIN,  WBC,  LACTICIDVEN   Time coordinating discharge: 35 minutes  SIGNED:  Almon Hercules, MD  [facility] 02/10/2020, 10:17 AM  If 7PM-7AM, please contact night-coverage [email or web address] Password [email or web address]

## 2020-02-10 NOTE — TOC Progression Note (Signed)
Transition of Care [facility]) - Progression Note    Patient Details  Name: Barbara Hill MRN: [id number] Date of Birth: 1940/11/25  Transition of Care [facility]) CM/SW Contact  Patrice [place], [place] Phone Number: [phone] 02/10/2020, 9:43 AM  Clinical Narrative:    CSW called [employer] and patient's authorization is back.  Talbot Grumbling #- [id number] Health Plan #- [id number] Case Manager: Hinton Rao Authorization dates: 6/19-6/22 Fax Number: [phone]   CSW reached out to [facility] care awaiting a call back.   TOC team will continue to monitor for discharge planning needs.  Expected Discharge Plan: Skilled Nursing Facility Barriers to Discharge: Continued Medical Work up, SNF Pending bed offer  Expected Discharge Plan and Services Expected Discharge Plan: Skilled Nursing Facility In-house Referral: Clinical Social Work       Expected Discharge Date: 02/09/20                                     Social Determinants of Health (SDOH) Interventions    Readmission Risk Interventions No flowsheet data found.

## 2020-02-13 ENCOUNTER — Other Ambulatory Visit: Payer: Self-pay

## 2020-02-13 ENCOUNTER — Ambulatory Visit (INDEPENDENT_AMBULATORY_CARE_PROVIDER_SITE_OTHER): Payer: Self-pay | Admitting: Vascular Surgery

## 2020-02-13 ENCOUNTER — Encounter: Payer: Self-pay | Admitting: Vascular Surgery

## 2020-02-13 VITALS — BP 102/60 | HR 65 | Temp 97.4°F | Resp 18 | Ht 63.0 in | Wt 100.0 lb

## 2020-02-13 DIAGNOSIS — I743 Embolism and thrombosis of arteries of the lower extremities: Secondary | ICD-10-CM

## 2020-02-13 NOTE — Progress Notes (Signed)
Patient name: Barbara Hill MRN: [id number] DOB: [birth year] Sex: female  REASON FOR VISIT: Follow-up bilateral popliteal embolus  HPI: Barbara Hill is a 79 y.o. female here today for follow-up.  She is here with her daughter.  She presented with ischemia and bilateral popliteal embolus.  She underwent below-knee popliteal exploration bilaterally with bilateral embolectomy.  She had restoration of normal flow to her foot.  She had severe tenderness in her posterior calf and anterior compartments on postop morning 1 and was returned to the operating room for bilateral 4 compartment fasciotomies.  He was seen by cardiology and started on anticoagulation with resumptive cardiac source.  She was quite deconditioned during her hospitalization and was discharged to skilled nursing facility.  She is quite anxious for discharge to home  Current Outpatient Medications  Medication Sig Dispense Refill  . acetaminophen (TYLENOL) 650 MG CR tablet Take 1,300 mg by mouth every 8 (eight) hours as needed for pain.    Marland Kitchen ALPRAZolam (XANAX) 0.5 MG tablet Take 1 tablet (0.5 mg total) by mouth 3 (three) times daily as needed for anxiety. 30 tablet 0  . apixaban (ELIQUIS) 5 MG TABS tablet Take 1 tablet (5 mg total) by mouth 2 (two) times daily. 60 tablet 1  . butalbital-acetaminophen-caffeine (FIORICET) 50-325-40 MG tablet Take 1 tablet by mouth every 8 (eight) hours as needed for headache or migraine. 14 tablet 3  . cholecalciferol (VITAMIN D) 1000 units tablet Take 1,000 Units by mouth daily.    . flecainide (TAMBOCOR) 50 MG tablet Take 1 tablet (50 mg total) by mouth 2 (two) times daily. 180 tablet 3  . FLUoxetine (PROZAC) 20 MG capsule Take 20 mg by mouth 2 (two) times daily.  11  . fluticasone (FLONASE) 50 MCG/ACT nasal spray Place 1 spray into both nostrils daily as needed for allergies or rhinitis.   11  . losartan (COZAAR) 25 MG tablet Take 25 mg by mouth daily.    .  metoprolol succinate (TOPROL-XL) 25 MG 24 hr tablet TAKE 1 TABLET BY MOUTH EVERYDAY AT BEDTIME (Patient taking differently: Take 25 mg by mouth at bedtime. ) 90 tablet 2  . Multiple Vitamin (MULTIVITAMIN WITH MINERALS) TABS tablet Take 1 tablet by mouth daily.    Marland Kitchen omeprazole (PRILOSEC) 40 MG capsule Take 40 mg by mouth daily.      Marland Kitchen oxyCODONE (OXY IR/ROXICODONE) 5 MG immediate release tablet Take 1 tablet (5 mg total) by mouth every 8 (eight) hours as needed for up to 5 days for moderate pain. 15 tablet 0  . polyethylene glycol powder (MIRALAX) 17 GM/SCOOP powder Take 17 g by mouth 2 (two) times daily as needed for moderate constipation. 255 g 0  . senna-docusate (SENOKOT-S) 8.6-50 MG tablet Take 1 tablet by mouth 2 (two) times daily between meals as needed for mild constipation. 60 tablet 0  . feeding supplement, ENSURE ENLIVE, (ENSURE ENLIVE) LIQD Take 237 mLs by mouth 2 (two) times daily between meals. (Patient not taking: Reported on 02/13/2020) 237 mL 12  . ferrous sulfate 325 (65 FE) MG tablet Take 1 tablet (325 mg total) by mouth 2 (two) times daily with a meal. (Patient not taking: Reported on 02/13/2020) 180 tablet 1  . Probiotic Product (PROBIOTIC PO) Take liquid probiotic by mouth twice daily (Activia)  (Patient not taking: Reported on 02/13/2020)    . vitamin B-12 1000 MCG tablet Take 1 tablet (1,000 mcg total) by mouth daily. (Patient not taking: Reported on 02/13/2020)  No current facility-administered medications for this visit.     PHYSICAL EXAM: Vitals:   02/13/20 1526  BP: 102/60  Pulse: 65  Resp: 18  Temp: (!) 97.4 F (36.3 C)  TempSrc: Temporal  SpO2: 96%  Weight: 100 lb (45.4 kg)  Height: 5\' 3"  (1.6 m)    GENERAL: The patient is a well-nourished female, in no acute distress. The vital signs are documented above. Medial lateral calf incisions are well-healed bilaterally.  Fullness in her left posterior calf but much less tenderness than in the hospital.  2+ right  dorsalis pedis and 2+ left posterior tibial pulse  MEDICAL ISSUES: Stable overall.  We will continue her walking at her skilled nursing facility.  She is looking forward to discharge.  Fortunately her daughter is been able to take a leave of absence from her work and will be home with her mother is preparing for her to be discharged to home in several days to 1 week.  I feel that this would be appropriate  I will see her again in 1 month with ankle arm indices at that time  Rosetta Posner, MD [facility] Tel [phone] Pager [phone]

## 2020-02-15 ENCOUNTER — Other Ambulatory Visit: Payer: Self-pay | Admitting: *Deleted

## 2020-02-15 DIAGNOSIS — I743 Embolism and thrombosis of arteries of the lower extremities: Secondary | ICD-10-CM

## 2020-02-27 ENCOUNTER — Encounter: Payer: Self-pay | Admitting: *Deleted

## 2020-02-27 ENCOUNTER — Telehealth: Payer: Self-pay | Admitting: *Deleted

## 2020-02-27 ENCOUNTER — Ambulatory Visit: Payer: Medicare HMO | Admitting: Neurology

## 2020-02-27 NOTE — Telephone Encounter (Signed)
Pt no showed new patient appt this AM.  

## 2020-02-27 NOTE — Progress Notes (Signed)
Error

## 2020-03-13 ENCOUNTER — Ambulatory Visit: Payer: Medicare Other | Admitting: Physician Assistant

## 2020-03-19 ENCOUNTER — Encounter (HOSPITAL_COMMUNITY): Payer: Medicare Other

## 2020-03-19 ENCOUNTER — Ambulatory Visit: Payer: Medicare Other | Admitting: Vascular Surgery

## 2020-03-27 ENCOUNTER — Other Ambulatory Visit: Payer: Self-pay

## 2020-03-27 DIAGNOSIS — I743 Embolism and thrombosis of arteries of the lower extremities: Secondary | ICD-10-CM

## 2020-04-09 ENCOUNTER — Ambulatory Visit: Payer: Medicare Other | Admitting: Vascular Surgery

## 2020-04-09 ENCOUNTER — Encounter (HOSPITAL_COMMUNITY): Payer: Medicare Other

## 2020-04-22 ENCOUNTER — Other Ambulatory Visit: Payer: Self-pay | Admitting: Internal Medicine

## 2020-04-22 DIAGNOSIS — I471 Supraventricular tachycardia: Secondary | ICD-10-CM

## 2020-04-22 DIAGNOSIS — I10 Essential (primary) hypertension: Secondary | ICD-10-CM

## 2020-04-23 ENCOUNTER — Institutional Professional Consult (permissible substitution): Payer: Medicare HMO | Admitting: Neurology

## 2020-05-06 ENCOUNTER — Ambulatory Visit: Payer: Medicare Other | Admitting: Internal Medicine

## 2020-06-18 ENCOUNTER — Other Ambulatory Visit: Payer: Self-pay | Admitting: Internal Medicine

## 2020-06-18 DIAGNOSIS — I471 Supraventricular tachycardia: Secondary | ICD-10-CM

## 2020-06-18 DIAGNOSIS — I10 Essential (primary) hypertension: Secondary | ICD-10-CM

## 2020-06-20 ENCOUNTER — Institutional Professional Consult (permissible substitution): Payer: Medicare HMO | Admitting: Neurology

## 2020-06-26 ENCOUNTER — Telehealth: Payer: Self-pay | Admitting: Student

## 2020-06-26 NOTE — Telephone Encounter (Signed)
NIR.  Received prescription refill request for Plavix. Faxed filled prescription to [facility] [phone]) at [phone]- Plavix 75 mg tablets, take one tablet by mouth once daily, dispense 30 tablets with 3 refills.   Waylan Boga Lylah Lantis, PA-C 06/26/2020, 3:52 PM

## 2020-07-03 NOTE — Progress Notes (Deleted)
Cardiology Office Note   Date:  07/03/2020   ID:  Barbara Hill, DOB 07-31-1941, MRN [id number]  PCP:  Richmond Campbell., PA-C  Cardiologist:   Dietrich Pates, MD   F/U of orthostasis and HTN    History of Present Illness: Barbara Hill is a 79 y.o. female with a history of orthostatic intolerance and hypertension, SVT, palpitations.  Syncopal spell in 2017  I saw the pt in 2020  She was seen by Wende Mott earlier ths spring    No outpatient medications have been marked as taking for the 07/04/20 encounter (Appointment) with Pricilla Riffle, MD.     Allergies:   Dobutamine, Epinephrine, Darvocet [propoxyphene n-acetaminophen], Excedrin extra strength [aspirin-acetaminophen-caffeine], Aspirin, Clarithromycin, Doxycycline, Penicillins, Prednisone, and Propoxyphene   Past Medical History:  Diagnosis Date  . Anxiety   . Brain aneurysm    pipeline stent 2017   . COPD (chronic obstructive pulmonary disease) (HCC)   . Fatigue   . H/O: hysterectomy   . Headache(784.0)   . History of cardiac catheterization    LHC 10/02: Normal coronary arteries  . History of nuclear stress test    Myoview 10/12: Normal stress nuclear study.  . Hypertension   . Mitral valve prolapse    Echo 8/17:Mild LVH, EF 60-65, normal wall motion, grade 2 diastolic dysfunction, mild AI, mild MR, mild LAE // echo 11/01: EF 60%, mild anterior MVP with trivial MR, PASP 35-40 mmHg, LAE  . Orthostasis   . PSVT (paroxysmal supraventricular tachycardia) (HCC)    palps  . Radiculopathy     Past Surgical History:  Procedure Laterality Date  . ABDOMINAL HYSTERECTOMY    . APPENDECTOMY    . BUBBLE STUDY  01/29/2020   Procedure: BUBBLE STUDY;  Surgeon: Parke Poisson, MD;  Location: [facility] ENDOSCOPY;  Service: Cardiology;;  . CARDIAC CATHETERIZATION  12/95  . CATARACT EXTRACTION    . CHOLECYSTECTOMY    . EMBOLECTOMY Bilateral 01/25/2020   Procedure: Bilateral popliteal EMBOLECTOMY.;  Surgeon: [name], MD;   Location: [facility] OR;  Service: Vascular;  Laterality: Bilateral;  . FASCIOTOMY Bilateral 01/26/2020   Procedure: FASCIOTOMY;  Surgeon: [name], MD;  Location: [facility] OR;  Service: Vascular;  Laterality: Bilateral;  . IR GENERIC HISTORICAL  06/02/2016   IR RADIOLOGIST EVAL & MGMT 06/02/2016 [facility]  . IR GENERIC HISTORICAL  06/29/2016   IR ANGIO INTRA EXTRACRAN SEL INTERNAL CAROTID UNI R MOD SED 06/29/2016 [name], MD [facility]  . IR GENERIC HISTORICAL  06/29/2016   IR ANGIOGRAM FOLLOW UP STUDY 06/29/2016 [name], MD [facility]  . IR GENERIC HISTORICAL  06/29/2016   IR NEURO EACH ADD'L AFTER BASIC UNI RIGHT (MS) 06/29/2016 [name], MD [facility]  . IR GENERIC HISTORICAL  06/29/2016   IR 3D INDEPENDENT WKST 06/29/2016 [name], MD [facility]  . IR GENERIC HISTORICAL  06/29/2016   IR TRANSCATH/EMBOLIZ 06/29/2016 [name], MD [facility]  . IR GENERIC HISTORICAL  06/29/2016   IR ANGIO VERTEBRAL SEL SUBCLAVIAN INNOMINATE UNI R MOD SED 06/29/2016 [name], MD [facility]  . IR GENERIC HISTORICAL  07/28/2016   IR RADIOLOGIST EVAL & MGMT 07/28/2016 [facility]  . IR GENERIC HISTORICAL  11/12/2016   IR ANGIO INTRA EXTRACRAN SEL COM CAROTID INNOMINATE BILAT MOD SED 11/12/2016 [name], MD [facility]  . IR GENERIC HISTORICAL  11/12/2016   IR ANGIO VERTEBRAL SEL VERTEBRAL UNI L MOD SED 11/12/2016 [name], MD [facility]  . IR GENERIC HISTORICAL  11/12/2016   IR ANGIO VERTEBRAL SEL SUBCLAVIAN INNOMINATE UNI R MOD SED 11/12/2016 [name], MD [facility]  . NASAL SEPTUM SURGERY    . RADIOLOGY WITH ANESTHESIA N/A 06/29/2016   Procedure: EMBOLIZATION;  Surgeon: [name], MD;  Location: [facility] OR;  Service: Radiology;  Laterality: N/A;  . ROTATOR CUFF REPAIR    . TEE WITHOUT CARDIOVERSION N/A 01/29/2020   Procedure: TRANSESOPHAGEAL ECHOCARDIOGRAM (TEE);  Surgeon: Parke Poisson, MD;  Location: [facility] ENDOSCOPY;   Service: Cardiology;  Laterality: N/A;     Social History:  The patient  reports that she has been smoking cigarettes. She has a 50.00 pack-year smoking history. She uses smokeless tobacco. She reports that she does not drink alcohol and does not use drugs.   Family History:  The patient's family history includes Asthma in an other family member; CAD in an other family member; Heart Problems in her brother; Heart attack in her mother and another family member; Heart attack (age of onset: 5) in her father; Heart failure in an other family member; Osteoporosis in an other family member; Transient ischemic attack in her mother.    ROS:  Please see the history of present illness. All other systems are reviewed and  Negative to the above problem except as noted.    PHYSICAL EXAM: VS:  There were no vitals taken for this visit.  GEN: Thin 79 yo, in no acute distress  HEENT: normal  Neck: no JVD, carotid bruits, or masses Cardiac: RRR; no murmurs, rubs, or gallops,no edema  Respiratory:  clear to auscultation bilaterally, normal work of breathing GI: soft, nontender, nondistended, + BS  No hepatomegaly  MS: no deformity Moving all extremities   Skin: warm and dry, no rash Neuro:  Strength and sensation are intact Psych: euthymic mood, full affect   EKG:  EKG is not ordered today.   Lipid Panel    Component Value Date/Time   CHOL 148 04/10/2019 1047   TRIG 71 04/10/2019 1047   HDL 68 04/10/2019 1047   CHOLHDL 2.2 04/10/2019 1047   CHOLHDL 2.9 07/24/2016 1125   VLDL 24 07/24/2016 1125   LDLCALC 66 04/10/2019 1047      Wt Readings from Last 3 Encounters:  02/13/20 100 lb (45.4 kg)  02/02/20 110 lb 10.7 oz (50.2 kg)  01/03/20 107 lb 6.4 oz (48.7 kg)      ASSESSMENT AND PLAN:  1  HTN  BP is 106 today     2  Orthostatic intolerance  She denies dizziness   Not clear of meds   WIll check with pharmacy   WIll check labs    3  Palpitations  Hx SVT   Has been on flecanide   Denies plapitations   Plan for f/u in 9 months     Current medicines are reviewed at length with the patient today.  The patient does not have concerns regarding medicines.  Signed, Dietrich Pates, MD  07/03/2020 8:56 PM    [facility] [address] [place], [place], [place]  [address] Phone: [phone]; Fax: [phone]

## 2020-07-04 ENCOUNTER — Ambulatory Visit: Payer: Medicare Other | Admitting: Internal Medicine

## 2020-07-27 ENCOUNTER — Other Ambulatory Visit: Payer: Self-pay | Admitting: Internal Medicine

## 2020-09-02 ENCOUNTER — Telehealth: Payer: Self-pay

## 2020-09-02 NOTE — Telephone Encounter (Signed)
Pt called with concerns of a knot and bruising in L upper thigh near buttock after falling a week and a half ago. She is currently on Plavix. I advised her to call PCP and make an appt. Upon calling her back to confirm she was able to get into PCP, pt stated she did get an appt for today with PCP and will call us if she needs further assistance.

## 2020-09-17 ENCOUNTER — Encounter (HOSPITAL_BASED_OUTPATIENT_CLINIC_OR_DEPARTMENT_OTHER): Payer: Self-pay | Admitting: *Deleted

## 2020-09-17 ENCOUNTER — Encounter (HOSPITAL_COMMUNITY): Payer: Self-pay | Admitting: Emergency Medicine

## 2020-09-17 ENCOUNTER — Emergency Department (HOSPITAL_COMMUNITY)
Admission: EM | Admit: 2020-09-17 | Discharge: 2020-09-17 | Disposition: A | Discharge status: Home / Self Care | Payer: Medicare HMO | Source: Home / Self Care

## 2020-09-17 ENCOUNTER — Other Ambulatory Visit: Payer: Self-pay

## 2020-09-17 ENCOUNTER — Emergency Department (HOSPITAL_COMMUNITY): Payer: Medicare HMO

## 2020-09-17 ENCOUNTER — Emergency Department (HOSPITAL_BASED_OUTPATIENT_CLINIC_OR_DEPARTMENT_OTHER): Payer: Medicare HMO

## 2020-09-17 ENCOUNTER — Inpatient Hospital Stay (HOSPITAL_BASED_OUTPATIENT_CLINIC_OR_DEPARTMENT_OTHER)
Admission: EM | Admit: 2020-09-17 | Discharge: 2020-09-20 | DRG: 871 | Disposition: A | Discharge status: Home Health | Payer: Medicare HMO | Attending: Internal Medicine | Admitting: Internal Medicine

## 2020-09-17 DIAGNOSIS — A419 Sepsis, unspecified organism: Secondary | ICD-10-CM | POA: Diagnosis not present

## 2020-09-17 DIAGNOSIS — J449 Chronic obstructive pulmonary disease, unspecified: Secondary | ICD-10-CM | POA: Diagnosis present

## 2020-09-17 DIAGNOSIS — Z5321 Procedure and treatment not carried out due to patient leaving prior to being seen by health care provider: Secondary | ICD-10-CM | POA: Insufficient documentation

## 2020-09-17 DIAGNOSIS — N1831 Chronic kidney disease, stage 3a: Secondary | ICD-10-CM | POA: Diagnosis present

## 2020-09-17 DIAGNOSIS — D6489 Other specified anemias: Secondary | ICD-10-CM | POA: Diagnosis present

## 2020-09-17 DIAGNOSIS — N3000 Acute cystitis without hematuria: Secondary | ICD-10-CM

## 2020-09-17 DIAGNOSIS — R631 Polydipsia: Secondary | ICD-10-CM | POA: Insufficient documentation

## 2020-09-17 DIAGNOSIS — D649 Anemia, unspecified: Secondary | ICD-10-CM | POA: Diagnosis present

## 2020-09-17 DIAGNOSIS — M25552 Pain in left hip: Secondary | ICD-10-CM | POA: Insufficient documentation

## 2020-09-17 DIAGNOSIS — N183 Chronic kidney disease, stage 3 unspecified: Secondary | ICD-10-CM | POA: Diagnosis present

## 2020-09-17 DIAGNOSIS — Z79899 Other long term (current) drug therapy: Secondary | ICD-10-CM

## 2020-09-17 DIAGNOSIS — Z90710 Acquired absence of both cervix and uterus: Secondary | ICD-10-CM

## 2020-09-17 DIAGNOSIS — R059 Cough, unspecified: Secondary | ICD-10-CM

## 2020-09-17 DIAGNOSIS — Z7901 Long term (current) use of anticoagulants: Secondary | ICD-10-CM

## 2020-09-17 DIAGNOSIS — F17200 Nicotine dependence, unspecified, uncomplicated: Secondary | ICD-10-CM | POA: Diagnosis present

## 2020-09-17 DIAGNOSIS — I671 Cerebral aneurysm, nonruptured: Secondary | ICD-10-CM | POA: Diagnosis present

## 2020-09-17 DIAGNOSIS — Z8673 Personal history of transient ischemic attack (TIA), and cerebral infarction without residual deficits: Secondary | ICD-10-CM

## 2020-09-17 DIAGNOSIS — R4182 Altered mental status, unspecified: Secondary | ICD-10-CM | POA: Insufficient documentation

## 2020-09-17 DIAGNOSIS — Z7902 Long term (current) use of antithrombotics/antiplatelets: Secondary | ICD-10-CM

## 2020-09-17 DIAGNOSIS — I739 Peripheral vascular disease, unspecified: Secondary | ICD-10-CM | POA: Diagnosis present

## 2020-09-17 DIAGNOSIS — G458 Other transient cerebral ischemic attacks and related syndromes: Secondary | ICD-10-CM | POA: Diagnosis present

## 2020-09-17 DIAGNOSIS — I471 Supraventricular tachycardia: Secondary | ICD-10-CM | POA: Diagnosis present

## 2020-09-17 DIAGNOSIS — R260 Ataxic gait: Secondary | ICD-10-CM

## 2020-09-17 DIAGNOSIS — N390 Urinary tract infection, site not specified: Secondary | ICD-10-CM | POA: Diagnosis present

## 2020-09-17 DIAGNOSIS — R29810 Facial weakness: Secondary | ICD-10-CM | POA: Diagnosis present

## 2020-09-17 DIAGNOSIS — R509 Fever, unspecified: Secondary | ICD-10-CM | POA: Diagnosis not present

## 2020-09-17 DIAGNOSIS — I129 Hypertensive chronic kidney disease with stage 1 through stage 4 chronic kidney disease, or unspecified chronic kidney disease: Secondary | ICD-10-CM | POA: Diagnosis present

## 2020-09-17 DIAGNOSIS — F17210 Nicotine dependence, cigarettes, uncomplicated: Secondary | ICD-10-CM | POA: Diagnosis present

## 2020-09-17 DIAGNOSIS — F419 Anxiety disorder, unspecified: Secondary | ICD-10-CM | POA: Diagnosis present

## 2020-09-17 DIAGNOSIS — G459 Transient cerebral ischemic attack, unspecified: Secondary | ICD-10-CM | POA: Diagnosis present

## 2020-09-17 DIAGNOSIS — U071 COVID-19: Secondary | ICD-10-CM | POA: Diagnosis present

## 2020-09-17 DIAGNOSIS — G8929 Other chronic pain: Secondary | ICD-10-CM | POA: Diagnosis present

## 2020-09-17 DIAGNOSIS — E876 Hypokalemia: Secondary | ICD-10-CM | POA: Diagnosis present

## 2020-09-17 LAB — COMPREHENSIVE METABOLIC PANEL
ALT: 19 U/L (ref 0–44)
ALT: 20 U/L (ref 0–44)
AST: 41 U/L (ref 15–41)
AST: 43 U/L — ABNORMAL HIGH (ref 15–41)
Albumin: 3.3 g/dL — ABNORMAL LOW (ref 3.5–5.0)
Albumin: 3.4 g/dL — ABNORMAL LOW (ref 3.5–5.0)
Alkaline Phosphatase: 84 U/L (ref 38–126)
Alkaline Phosphatase: 82 U/L (ref 38–126)
Anion gap: 11 (ref 5–15)
Anion gap: 9 (ref 5–15)
BUN: 17 mg/dL (ref 8–23)
BUN: 19 mg/dL (ref 8–23)
CO2: 23 mmol/L (ref 22–32)
CO2: 26 mmol/L (ref 22–32)
Calcium: 8.8 mg/dL — ABNORMAL LOW (ref 8.9–10.3)
Calcium: 8.8 mg/dL — ABNORMAL LOW (ref 8.9–10.3)
Chloride: 104 mmol/L (ref 98–111)
Chloride: 101 mmol/L (ref 98–111)
Creatinine, Ser: 1.09 mg/dL — ABNORMAL HIGH (ref 0.44–1.00)
Creatinine, Ser: 1.13 mg/dL — ABNORMAL HIGH (ref 0.44–1.00)
GFR, Estimated: 52 mL/min — ABNORMAL LOW (ref >60)
GFR, Estimated: 50 mL/min — ABNORMAL LOW (ref >60)
Glucose, Bld: 92 mg/dL (ref 70–99)
Glucose, Bld: 100 mg/dL — ABNORMAL HIGH (ref 70–99)
Potassium: 3.4 mmol/L — ABNORMAL LOW (ref 3.5–5.1)
Potassium: 3.3 mmol/L — ABNORMAL LOW (ref 3.5–5.1)
Sodium: 138 mmol/L (ref 135–145)
Sodium: 136 mmol/L (ref 135–145)
Total Bilirubin: 0.2 mg/dL — ABNORMAL LOW (ref 0.3–1.2)
Total Bilirubin: 0.3 mg/dL (ref 0.3–1.2)
Total Protein: 6.9 g/dL (ref 6.5–8.1)
Total Protein: 7.2 g/dL (ref 6.5–8.1)

## 2020-09-17 LAB — URINALYSIS, MICROSCOPIC (REFLEX)

## 2020-09-17 LAB — CBC WITH DIFFERENTIAL/PLATELET
Abs Immature Granulocytes: 0.02 10*3/uL (ref 0.00–0.07)
Basophils Absolute: 0 10*3/uL (ref 0.0–0.1)
Basophils Relative: 0 %
Eosinophils Absolute: 0 10*3/uL (ref 0.0–0.5)
Eosinophils Relative: 0 %
HCT: 33.9 % — ABNORMAL LOW (ref 36.0–46.0)
Hemoglobin: 11.2 g/dL — ABNORMAL LOW (ref 12.0–15.0)
Immature Granulocytes: 0 %
Lymphocytes Relative: 29 %
Lymphs Abs: 1.6 10*3/uL (ref 0.7–4.0)
MCH: 30.2 pg (ref 26.0–34.0)
MCHC: 33 g/dL (ref 30.0–36.0)
MCV: 91.4 fL (ref 80.0–100.0)
Monocytes Absolute: 0.7 10*3/uL (ref 0.1–1.0)
Monocytes Relative: 13 %
Neutro Abs: 3.2 10*3/uL (ref 1.7–7.7)
Neutrophils Relative %: 58 %
Platelets: 206 10*3/uL (ref 150–400)
RBC: 3.71 MIL/uL — ABNORMAL LOW (ref 3.87–5.11)
RDW: 14.3 % (ref 11.5–15.5)
WBC: 5.6 10*3/uL (ref 4.0–10.5)
nRBC: 0 % (ref 0.0–0.2)

## 2020-09-17 LAB — CBC
HCT: 32.6 % — ABNORMAL LOW (ref 36.0–46.0)
Hemoglobin: 10.6 g/dL — ABNORMAL LOW (ref 12.0–15.0)
MCH: 30.2 pg (ref 26.0–34.0)
MCHC: 32.5 g/dL (ref 30.0–36.0)
MCV: 92.9 fL (ref 80.0–100.0)
Platelets: 206 10*3/uL (ref 150–400)
RBC: 3.51 MIL/uL — ABNORMAL LOW (ref 3.87–5.11)
RDW: 14.2 % (ref 11.5–15.5)
WBC: 4.4 10*3/uL (ref 4.0–10.5)
nRBC: 0 % (ref 0.0–0.2)

## 2020-09-17 LAB — URINALYSIS, ROUTINE W REFLEX MICROSCOPIC
Bilirubin Urine: NEGATIVE
Glucose, UA: NEGATIVE mg/dL
Ketones, ur: NEGATIVE mg/dL
Nitrite: POSITIVE — AB
Protein, ur: NEGATIVE mg/dL
Specific Gravity, Urine: 1.03 (ref 1.005–1.030)
pH: 6 (ref 5.0–8.0)

## 2020-09-17 LAB — TROPONIN I (HIGH SENSITIVITY)
Troponin I (High Sensitivity): 24 ng/L — ABNORMAL HIGH (ref <18)
Troponin I (High Sensitivity): 26 ng/L — ABNORMAL HIGH (ref <18)

## 2020-09-17 LAB — SARS CORONAVIRUS 2 BY RT PCR (HOSPITAL ORDER, PERFORMED IN ~~LOC~~ HOSPITAL LAB): SARS Coronavirus 2: POSITIVE — AB

## 2020-09-17 MED ORDER — SODIUM CHLORIDE 0.9 % IV SOLN
1.0000 g | Freq: Once | INTRAVENOUS | Status: AC
  Administered 2020-09-17: 1 g via INTRAVENOUS
  Filled 2020-09-17: qty 10

## 2020-09-17 MED ORDER — IOHEXOL 350 MG/ML SOLN
100.0000 mL | Freq: Once | INTRAVENOUS | Status: AC | PRN
  Administered 2020-09-17: 75 mL via INTRAVENOUS

## 2020-09-17 NOTE — ED Triage Notes (Signed)
Weakness, dizziness, facial droop this am. She was taken to [facility] via EMS and waited 2 hours. They left without being seen. She is alert oriented. She has a hx of left hip injury that has prevented her from walking with out pain.

## 2020-09-17 NOTE — ED Provider Notes (Signed)
[facility] EMERGENCY DEPARTMENT Provider Note   CSN: [id number] Arrival date & time: 09/17/20  1525     History Chief Complaint  Patient presents with  . Weakness  . Dizziness  . Facial Droop    Barbara Hill is a 80 y.o. female.  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  Daughter at bedside states she found patient to be falling over on the couch this morning with questionable left-sided facial droop and difficulty with balance.  Her last normal was 8 PM last night.  EMS took her to [facility] about 1:30 PM but they came here instead due to the wait time.  Daughter states patient's facial droop has improved but she still seems to be off balance and dizzy and having difficulty walking and falling to the side.  Patient denies any weakness, numbness or tingling.  No difficulty speaking or difficulty swallowing.  States compliance with her medications. Daughter reports patient fell onto her left side several weeks ago was found to have a hematoma to her left hip which has since improved but today was having some left-sided abdominal pain that is also improving.  No vomiting.  No diarrhea.  No pain with urination or blood in urine.  No chest pain or shortness of breath. Patient normally walks on her own without difficulty or assistance.  The history is provided by the patient and a relative.  Weakness Associated symptoms: abdominal pain, arthralgias, dizziness and myalgias   Associated symptoms: no chest pain, no cough, no dysuria, no headaches, no nausea, no shortness of breath and no vomiting   Dizziness Associated symptoms: weakness   Associated symptoms: no chest pain, no headaches, no nausea, no shortness of breath and no vomiting        Past Medical History:  Diagnosis Date  . Anxiety   . Brain  aneurysm    pipeline stent 2017   . COPD (chronic obstructive pulmonary disease) (HCC)   . Fatigue   . H/O: hysterectomy   . Headache(784.0)   . History of cardiac catheterization    LHC 10/02: Normal coronary arteries  . History of nuclear stress test    Myoview 10/12: Normal stress nuclear study.  . Hypertension   . Mitral valve prolapse    Echo 8/17:Mild LVH, EF 60-65, normal wall motion, grade 2 diastolic dysfunction, mild AI, mild MR, mild LAE // echo 11/01: EF 60%, mild anterior MVP with trivial MR, PASP 35-40 mmHg, LAE  . Orthostasis   . PSVT (paroxysmal supraventricular tachycardia) (HCC)    palps  . Radiculopathy     Patient Active Problem List   Diagnosis Date Noted  . Protein-calorie malnutrition, severe 02/05/2020  . Ischemic leg 01/25/2020  . COPD (chronic obstructive pulmonary disease) (HCC) 01/25/2020  . Normocytic anemia 01/25/2020  . History of CVA (cerebrovascular accident) 01/25/2020  . Anxiety 01/25/2020  . CKD (chronic kidney disease), stage III (HCC) 01/25/2020  . 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 Brain aneurysm 06/29/2016  . Lumbar contusion   . Orthostatic hypotension   . First degree AV block   . Hypokalemia   . Syncope 04/10/2016  . Paroxysmal SVT (supraventricular tachycardia) (HCC) 04/21/2011  . 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 Tobacco use disorder 04/21/2011    Past Surgical History:  Procedure Laterality Date  . ABDOMINAL HYSTERECTOMY    .  APPENDECTOMY    . BUBBLE STUDY  01/29/2020   Procedure: BUBBLE STUDY;  Surgeon: Parke PoissonAcharya, Gayatri A, MD;  [facility];  Service: Cardiology;;  . CARDIAC CATHETERIZATION  12/95  . CATARACT EXTRACTION    . CHOLECYSTECTOMY    . EMBOLECTOMY Bilateral 01/25/2020   Procedure: Bilateral popliteal EMBOLECTOMY.;  Surgeon: Larina EarthlyEarly, Todd F, MD;  [facility];  Service: Vascular;  Laterality: Bilateral;  . FASCIOTOMY Bilateral 01/26/2020   Procedure: FASCIOTOMY;  Surgeon: Larina EarthlyEarly,  Todd F, MD;  [facility];  Service: Vascular;  Laterality: Bilateral;  . IR GENERIC HISTORICAL  06/02/2016   IR RADIOLOGIST EVAL & MGMT 06/02/2016 [facility]  . IR GENERIC HISTORICAL  06/29/2016   IR ANGIO INTRA EXTRACRAN SEL INTERNAL CAROTID UNI R MOD SED 06/29/2016 Julieanne CottonSanjeev Deveshwar, MD [facility]  . IR GENERIC HISTORICAL  06/29/2016   IR ANGIOGRAM FOLLOW UP STUDY 06/29/2016 Julieanne CottonSanjeev Deveshwar, MD [facility]  . IR GENERIC HISTORICAL  06/29/2016   IR NEURO EACH ADD'L AFTER BASIC UNI RIGHT (MS) 06/29/2016 Julieanne CottonSanjeev Deveshwar, MD [facility]  . IR GENERIC HISTORICAL  06/29/2016   IR 3D INDEPENDENT WKST 06/29/2016 Julieanne CottonSanjeev Deveshwar, MD [facility]  . IR GENERIC HISTORICAL  06/29/2016   IR TRANSCATH/EMBOLIZ 06/29/2016 Julieanne CottonSanjeev Deveshwar, MD [facility]  . IR GENERIC HISTORICAL  06/29/2016   IR ANGIO VERTEBRAL SEL SUBCLAVIAN INNOMINATE UNI R MOD SED 06/29/2016 Julieanne CottonSanjeev Deveshwar, MD [facility]  . IR GENERIC HISTORICAL  07/28/2016   IR RADIOLOGIST EVAL & MGMT 07/28/2016 [facility]  . IR GENERIC HISTORICAL  11/12/2016   IR ANGIO INTRA EXTRACRAN SEL COM CAROTID INNOMINATE BILAT MOD SED 11/12/2016 Julieanne CottonSanjeev Deveshwar, MD [facility]  . IR GENERIC HISTORICAL  11/12/2016   IR ANGIO VERTEBRAL SEL VERTEBRAL UNI L MOD SED 11/12/2016 Julieanne CottonSanjeev Deveshwar, MD [facility]  . IR GENERIC HISTORICAL  11/12/2016   IR ANGIO VERTEBRAL SEL SUBCLAVIAN INNOMINATE UNI R MOD SED 11/12/2016 Julieanne CottonSanjeev Deveshwar, MD [facility]  . NASAL SEPTUM SURGERY    . RADIOLOGY WITH ANESTHESIA N/A 06/29/2016   Procedure: EMBOLIZATION;  Surgeon: Julieanne CottonSanjeev Deveshwar, MD;  Location: [facility] OR;  Service: Radiology;  Laterality: N/A;  . ROTATOR CUFF REPAIR    . TEE WITHOUT CARDIOVERSION N/A 01/29/2020   Procedure: TRANSESOPHAGEAL ECHOCARDIOGRAM (TEE);  Surgeon: Parke PoissonAcharya, Gayatri A, MD;  [facility];  Service: Cardiology;  Laterality: N/A;     OB History   No obstetric history on file.     Family History  Problem Relation Age  of Onset  . Heart attack Mother   . Transient ischemic attack Mother   . Heart attack Father 6080  . Heart attack Other   . Asthma Other   . Heart failure Other   . Osteoporosis Other   . Heart Problems Brother   . CAD Other     Social History   Tobacco Use  . Smoking status: Current Every Day Smoker    Packs/day: 1.00    Years: 50.00    Pack years: 50.00    Types: Cigarettes  . Smokeless tobacco: Current User  Vaping Use  . Vaping Use: Never used  Substance Use Topics  . Alcohol use: No  . Drug use: No    Home Medications Prior to Admission medications   Medication Sig Start Date End Date Taking? Authorizing Provider  acetaminophen (TYLENOL) 650 MG CR tablet Take 1,300 mg by mouth every 8 (eight) hours as needed for pain.    [provider]  ALPRAZolam [name](XANAX)  0.5 MG tablet Take 1 tablet (0.5 mg total) by mouth 3 (three) times daily as needed for anxiety. 02/09/20   Almon Hercules, MD  apixaban (ELIQUIS) 5 MG TABS tablet Take 1 tablet (5 mg total) by mouth 2 (two) times daily. 02/09/20   Almon Hercules, MD  butalbital-acetaminophen-caffeine (FIORICET) 50-325-40 MG tablet Take 1 tablet by mouth every 8 (eight) hours as needed for headache or migraine. 02/09/20   Almon Hercules, MD  cholecalciferol (VITAMIN D) 1000 units tablet Take 1,000 Units by mouth daily.    [provider]  feeding supplement, ENSURE ENLIVE, (ENSURE ENLIVE) LIQD Take 237 mLs by mouth 2 (two) times daily between meals. Patient not taking: Reported on 02/13/2020 02/09/20   Almon Hercules, MD  ferrous sulfate 325 (65 FE) MG tablet Take 1 tablet (325 mg total) by mouth 2 (two) times daily with a meal. Patient not taking: Reported on 02/13/2020 02/09/20 08/07/20  Almon Hercules, MD  flecainide (TAMBOCOR) 50 MG tablet Take 1 tablet (50 mg total) by mouth 2 (two) times daily. 07/29/20   Pricilla Riffle, MD  FLUoxetine (PROZAC) 20 MG capsule Take 20 mg by mouth 2 (two) times daily. 06/07/16   [provider]  fluticasone (FLONASE) 50 MCG/ACT nasal spray Place 1 spray into both nostrils daily as needed for allergies or rhinitis.  10/11/14   [provider]  losartan (COZAAR) 25 MG tablet Take 25 mg by mouth daily.    [provider]  metoprolol succinate (TOPROL-XL) 25 MG 24 hr tablet TAKE 1 TABLET BY MOUTH EVERY DAY AT BEDTIME 06/20/20   Pricilla Riffle, MD  Multiple Vitamin (MULTIVITAMIN WITH MINERALS) TABS tablet Take 1 tablet by mouth daily. 02/09/20   Almon Hercules, MD  omeprazole (PRILOSEC) 40 MG capsule Take 40 mg by mouth daily.      [provider]  polyethylene glycol powder (MIRALAX) 17 GM/SCOOP powder Take 17 g by mouth 2 (two) times daily as needed for moderate constipation. 02/09/20   Almon Hercules, MD  Probiotic Product (PROBIOTIC PO) Take liquid probiotic by mouth twice daily (Activia)  Patient not taking: Reported on 02/13/2020    [provider]  senna-docusate (SENOKOT-S) 8.6-50 MG tablet Take 1 tablet by mouth 2 (two) times daily between meals as needed for mild constipation. 02/09/20   Almon Hercules, MD  vitamin B-12 1000 MCG tablet Take 1 tablet (1,000 mcg total) by mouth daily. Patient not taking: Reported on 02/13/2020 02/09/20   Almon Hercules, MD    Allergies    Dobutamine, Epinephrine, Darvocet [propoxyphene n-acetaminophen], Excedrin extra strength [aspirin-acetaminophen-caffeine], Aspirin, Clarithromycin, Doxycycline, Penicillins, Prednisone, and Propoxyphene  Review of Systems   Review of Systems  Constitutional: Positive for activity change and appetite change. Negative for fatigue.  HENT: Negative for congestion and rhinorrhea.   Eyes: Negative for photophobia.  Respiratory: Negative for cough, chest tightness and shortness of breath.   Cardiovascular: Negative for chest pain.  Gastrointestinal: Positive for abdominal pain. Negative for nausea and vomiting.  Genitourinary: Negative for dysuria.  Musculoskeletal: Positive  for arthralgias and myalgias. Negative for back pain and neck pain.  Skin: Negative for rash.  Neurological: Positive for dizziness and weakness. Negative for speech difficulty and headaches.   all other systems are negative except as noted in the HPI and PMH.    Physical Exam Updated Vital Signs BP (!) 170/66 (BP Location: Right Arm)   Pulse (!) 58   Temp 98.8  F (37.1 C) (Oral)   Resp 20   Ht 5\' 3"  (1.6 m)   Wt 42.5 kg   SpO2 99%   BMI 16.60 kg/m   Physical Exam Vitals and nursing note reviewed.  Constitutional:      General: She is not in acute distress.    Appearance: She is well-developed and well-nourished.     Comments: Oriented to person, place and time  HENT:     Head: Normocephalic and atraumatic.     Mouth/Throat:     Mouth: Oropharynx is clear and moist.     Pharynx: No oropharyngeal exudate.  Eyes:     Extraocular Movements: EOM normal.     Conjunctiva/sclera: Conjunctivae normal.     Pupils: Pupils are equal, round, and reactive to light.  Neck:     Comments: No meningismus. Cardiovascular:     Rate and Rhythm: Normal rate and regular rhythm.     Pulses: Intact distal pulses.     Heart sounds: Normal heart sounds. No murmur heard.     Comments: Equal DP, PT and femoral pulses bilaterally Pulmonary:     Effort: Pulmonary effort is normal. No respiratory distress.     Breath sounds: Normal breath sounds.  Chest:     Chest wall: No tenderness.  Abdominal:     Palpations: Abdomen is soft.     Tenderness: There is abdominal tenderness. There is no guarding or rebound.     Comments: Tenderness to the left lower quadrant without guarding or rebound  Musculoskeletal:        General: No tenderness or edema. Normal range of motion.     Cervical back: Normal range of motion and neck supple.  Skin:    General: Skin is warm.  Neurological:     Mental Status: She is alert and oriented to person, place, and time.     Cranial Nerves: No cranial nerve deficit.      Motor: No abnormal muscle tone.     Coordination: Coordination normal.     Comments: CN 2-12 intact, no ataxia on finger to nose, no nystagmus, 5/5 strength throughout, no pronator drift,  Romberg positive, ataxic gait, needs assistance.  Psychiatric:        Mood and Affect: Mood and affect normal.        Behavior: Behavior normal.     ED Results / Procedures / Treatments   Labs (all labs ordered are listed, but only abnormal results are displayed) Labs Reviewed  SARS CORONAVIRUS 2 BY RT PCR (HOSPITAL ORDER, PERFORMED IN [facility]) - Abnormal; Notable for the following components:      Result Value   SARS Coronavirus 2 POSITIVE (*)    All other components within normal limits  CBC WITH DIFFERENTIAL/PLATELET - Abnormal; Notable for the following components:   RBC 3.71 (*)    Hemoglobin 11.2 (*)    HCT 33.9 (*)    All other components within normal limits  COMPREHENSIVE METABOLIC PANEL - Abnormal; Notable for the following components:   Potassium 3.3 (*)    Glucose, Bld 100 (*)    Creatinine, Ser 1.13 (*)    Calcium 8.8 (*)    Albumin 3.4 (*)    AST 43 (*)    GFR, Estimated 50 (*)    All other components within normal limits  URINALYSIS, ROUTINE W REFLEX MICROSCOPIC - Abnormal; Notable for the following components:   APPearance CLOUDY (*)    Hgb urine dipstick LARGE (*)  Nitrite POSITIVE (*)    Leukocytes,Ua MODERATE (*)    All other components within normal limits  URINALYSIS, MICROSCOPIC (REFLEX) - Abnormal; Notable for the following components:   Bacteria, UA MANY (*)    All other components within normal limits  TROPONIN I (HIGH SENSITIVITY) - Abnormal; Notable for the following components:   Troponin I (High Sensitivity) 24 (*)    All other components within normal limits  TROPONIN I (HIGH SENSITIVITY) - Abnormal; Notable for the following components:   Troponin I (High Sensitivity) 26 (*)    All other components within normal limits  URINE  CULTURE    EKG None  Radiology CT Angio Head W or Wo Contrast  Result Date: 09/17/2020 CLINICAL DATA:  Neuro deficit, acute, stroke suspected. Weakness, dizziness, facial droop this morning. History of brain aneurysm. EXAM: CT ANGIOGRAPHY HEAD AND NECK TECHNIQUE: Multidetector CT imaging of the head and neck was performed using the standard protocol during bolus administration of intravenous contrast. Multiplanar CT image reconstructions and MIPs were obtained to evaluate the vascular anatomy. Carotid stenosis measurements (when applicable) are obtained utilizing NASCET criteria, using the distal internal carotid diameter as the denominator. CONTRAST:  75mL OMNIPAQUE IOHEXOL 350 MG/ML SOLN COMPARISON:  Noncontrast head CT performed earlier today 09/17/2020. CT angiogram FINDINGS: CTA NECK FINDINGS Aortic arch: Atherosclerotic plaque within the visualized aortic arch and proximal major branch vessels of the neck. The origins of the innominate and left common carotid arteries are excluded from the field of view. Within this limitation, no hemodynamically significant innominate or proximal subclavian artery stenosis. Right carotid system: CCA and ICA patent within the neck without stenosis. No significant atherosclerotic disease. Left carotid system: The origin of the common carotid artery is not included in the field of view. Within this limitation, the CCA and ICA are patent within the neck without stenosis. No significant atherosclerotic disease. Vertebral arteries: Codominant and patent within the neck without stenosis. Minimal calcified plaque within the V1 right vertebral artery. Skeleton: No acute bony abnormality or aggressive osseous lesion. Trace C2-C3 grade 1 retrolisthesis. 2 mm C4-C5 grade 1 anterolisthesis. Trace C6-C7 grade 1 retrolisthesis. Cervical spondylosis. Most notably, there is advanced C5-C6 disc space narrowing. Other neck: No neck mass or cervical lymphadenopathy. Subcentimeter right  thyroid lobe nodule not meeting consensus criteria for ultrasound follow-up Upper chest: No consolidation within the imaged lung apices. Centrilobular emphysema. Review of the MIP images confirms the above findings CTA HEAD FINDINGS Anterior circulation: The intracranial internal carotid arteries are patent. Nonstenotic calcified plaque within both vessels. Redemonstrated flow diverter device within the M1 right MCA. Stent artifact size limits evaluation for in stent stenosis. However, there is flow proximal and distal to the flow diverter consistent with stent patency. A 3 mm aneurysm at site of the flow diverter has slightly increased in size as compared to the CTA of 01/07/2018 (previously 2.5 mm) (series 7, image 248). The M1 left middle cerebral artery is patent. No M2 proximal branch occlusion or high-grade proximal stenosis is identified. The anterior cerebral arteries are patent. 1-2 mm vascular protrusion projecting anteriorly from the anterior communicating artery complex (series 7, image 245) (series 9, image 110). In retrospect, this finding was present on the prior CT a of 01/07/2018 Posterior circulation: The intracranial vertebral arteries are patent. The basilar artery is patent. The posterior cerebral arteries are patent. A right posterior communicating artery is present. The left posterior communicating artery is hypoplastic or absent. Venous sinuses: Within the limitations of contrast timing, no  convincing thrombus. Anatomic variants: As described Review of the MIP images confirms the above findings IMPRESSION: CTA neck: The origins of the innominate and left common carotid arteries are excluded from the field of view. Within this limitation, the common carotid, internal carotid and vertebral arteries are patent within the neck without hemodynamically significant stenosis. CTA head: 1. Redemonstrated flow diverter device within the M1 right middle cerebral artery. A 3 mm aneurysm at this site has  slightly increased in size as compared to the CTA head of 01/07/2018 (previously 2.5 mm) 2. Stent artifact limits evaluation for right MCA in-stent stenosis. However, flow is present proximal and distal to the stent consistent with stent patency. 3. Elsewhere, no intracranial large vessel occlusion or proximal high-grade arterial stenosis is identified. 4. 1-2 mm anteriorly projecting vascular protrusion arising from the anterior communicating artery complex. This may reflect an additional small aneurysm or the origin of an otherwise poorly delineated small-vessel. In retrospect, this finding was present on the prior CTA. Electronically Signed   By: Jackey Loge DO   On: 09/17/2020 20:19   CT Head Wo Contrast  Result Date: 09/17/2020 CLINICAL DATA:  Transient ischemic attack (TIA). Additional history provided: Weakness, dizziness, facial droop this morning. EXAM: CT HEAD WITHOUT CONTRAST TECHNIQUE: Contiguous axial images were obtained from the base of the skull through the vertex without intravenous contrast. COMPARISON:  Brain MRI 07/01/2016.  CT a head/neck 01/07/2018. FINDINGS: Brain: Mild cerebral and cerebellar atrophy. Redemonstrated chronic lacunar infarct within right caudate nucleus/internal capsule genu. Additional the infarct within the right lentiform nucleus. Background mild ill-defined hypoattenuation within the cerebral white matter is nonspecific but compatible with chronic small vessel ischemic disease. There is no acute intracranial hemorrhage. No demarcated cortical infarct. No extra-axial fluid collection. No evidence of intracranial mass. No midline shift. Vascular: No hyperdense vessel. Redemonstrated flow diverter device in the region of the proximal right middle cerebral artery. Skull: Normal. Negative for fracture or focal lesion. Sinuses/Orbits: Visualized orbits show no acute finding. No significant paranasal sinus disease at the imaged levels. IMPRESSION: No evidence of acute  intracranial abnormality. Redemonstrated chronic right basal ganglia lacunar infarcts. Background mild generalized atrophy of the brain and mild chronic small vessel ischemic disease. Redemonstrated flow diverter device in the region of the proximal right middle cerebral artery. Electronically Signed   By: Jackey Loge DO   On: 09/17/2020 16:26   CT Angio Neck W and/or Wo Contrast  Result Date: 09/17/2020 CLINICAL DATA:  Neuro deficit, acute, stroke suspected. Weakness, dizziness, facial droop this morning. History of brain aneurysm. EXAM: CT ANGIOGRAPHY HEAD AND NECK TECHNIQUE: Multidetector CT imaging of the head and neck was performed using the standard protocol during bolus administration of intravenous contrast. Multiplanar CT image reconstructions and MIPs were obtained to evaluate the vascular anatomy. Carotid stenosis measurements (when applicable) are obtained utilizing NASCET criteria, using the distal internal carotid diameter as the denominator. CONTRAST:  75mL OMNIPAQUE IOHEXOL 350 MG/ML SOLN COMPARISON:  Noncontrast head CT performed earlier today 09/17/2020. CT angiogram FINDINGS: CTA NECK FINDINGS Aortic arch: Atherosclerotic plaque within the visualized aortic arch and proximal major branch vessels of the neck. The origins of the innominate and left common carotid arteries are excluded from the field of view. Within this limitation, no hemodynamically significant innominate or proximal subclavian artery stenosis. Right carotid system: CCA and ICA patent within the neck without stenosis. No significant atherosclerotic disease. Left carotid system: The origin of the common carotid artery is not included in the field  of view. Within this limitation, the CCA and ICA are patent within the neck without stenosis. No significant atherosclerotic disease. Vertebral arteries: Codominant and patent within the neck without stenosis. Minimal calcified plaque within the V1 right vertebral artery. Skeleton: No  acute bony abnormality or aggressive osseous lesion. Trace C2-C3 grade 1 retrolisthesis. 2 mm C4-C5 grade 1 anterolisthesis. Trace C6-C7 grade 1 retrolisthesis. Cervical spondylosis. Most notably, there is advanced C5-C6 disc space narrowing. Other neck: No neck mass or cervical lymphadenopathy. Subcentimeter right thyroid lobe nodule not meeting consensus criteria for ultrasound follow-up Upper chest: No consolidation within the imaged lung apices. Centrilobular emphysema. Review of the MIP images confirms the above findings CTA HEAD FINDINGS Anterior circulation: The intracranial internal carotid arteries are patent. Nonstenotic calcified plaque within both vessels. Redemonstrated flow diverter device within the M1 right MCA. Stent artifact size limits evaluation for in stent stenosis. However, there is flow proximal and distal to the flow diverter consistent with stent patency. A 3 mm aneurysm at site of the flow diverter has slightly increased in size as compared to the CTA of 01/07/2018 (previously 2.5 mm) (series 7, image 248). The M1 left middle cerebral artery is patent. No M2 proximal branch occlusion or high-grade proximal stenosis is identified. The anterior cerebral arteries are patent. 1-2 mm vascular protrusion projecting anteriorly from the anterior communicating artery complex (series 7, image 245) (series 9, image 110). In retrospect, this finding was present on the prior CT a of 01/07/2018 Posterior circulation: The intracranial vertebral arteries are patent. The basilar artery is patent. The posterior cerebral arteries are patent. A right posterior communicating artery is present. The left posterior communicating artery is hypoplastic or absent. Venous sinuses: Within the limitations of contrast timing, no convincing thrombus. Anatomic variants: As described Review of the MIP images confirms the above findings IMPRESSION: CTA neck: The origins of the innominate and left common carotid arteries are  excluded from the field of view. Within this limitation, the common carotid, internal carotid and vertebral arteries are patent within the neck without hemodynamically significant stenosis. CTA head: 1. Redemonstrated flow diverter device within the M1 right middle cerebral artery. A 3 mm aneurysm at this site has slightly increased in size as compared to the CTA head of 01/07/2018 (previously 2.5 mm) 2. Stent artifact limits evaluation for right MCA in-stent stenosis. However, flow is present proximal and distal to the stent consistent with stent patency. 3. Elsewhere, no intracranial large vessel occlusion or proximal high-grade arterial stenosis is identified. 4. 1-2 mm anteriorly projecting vascular protrusion arising from the anterior communicating artery complex. This may reflect an additional small aneurysm or the origin of an otherwise poorly delineated small-vessel. In retrospect, this finding was present on the prior CTA. Electronically Signed   By: Jackey Loge DO   On: 09/17/2020 20:19   CT ABDOMEN PELVIS W CONTRAST  Result Date: 09/17/2020 CLINICAL DATA:  80 year old female with abdominal pain. EXAM: CT ABDOMEN AND PELVIS WITH CONTRAST TECHNIQUE: Multidetector CT imaging of the abdomen and pelvis was performed using the standard protocol following bolus administration of intravenous contrast. CONTRAST:  17mL OMNIPAQUE IOHEXOL 350 MG/ML SOLN COMPARISON:  CT abdomen pelvis dated 02/15/2015. FINDINGS: Lower chest: There is a 12 x 13 mm nodule at the right lung base. Further evaluation with dedicated chest CT is recommended. There are bibasilar linear atelectasis/scarring. No intra-abdominal free air or free fluid. Hepatobiliary: The liver is unremarkable. No intrahepatic biliary dilatation. Cholecystectomy. Pancreas: Unremarkable. No pancreatic ductal dilatation or surrounding inflammatory  changes. Spleen: Normal in size without focal abnormality. Adrenals/Urinary Tract: The adrenal glands  unremarkable. There is no hydronephrosis on either side. There is symmetric enhancement and excretion of contrast by both kidneys. Bilateral renal cysts measure up to 2 cm in the upper pole of the right kidney. Smaller bilateral hypodensities are not characterized. The urinary bladder is grossly unremarkable. Stomach/Bowel: There is severe sigmoid diverticulosis without active inflammatory changes. There is no bowel obstruction or active inflammation. Moderate stool noted in the rectal vault. Appendectomy. Vascular/Lymphatic: Moderate aortoiliac atherosclerotic disease. The IVC is unremarkable. No portal venous gas. There is no adenopathy. Reproductive: Hysterectomy. No adnexal masses. Other: None Musculoskeletal: Osteopenia with degenerative changes of the spine. No acute osseous pathology. IMPRESSION: 1. No acute intra-abdominal or pelvic pathology. 2. Severe sigmoid diverticulosis. No bowel obstruction. 3. A 12 x 13 mm right lung base nodule. Dedicated chest CT is recommended. 4. Aortic Atherosclerosis (ICD10-I70.0). Electronically Signed   By: Elgie Collard M.D.   On: 09/17/2020 19:34   DG Hip Unilat With Pelvis 2-3 Views Left  Result Date: 09/17/2020 CLINICAL DATA:  Left hip injury. EXAM: DG HIP (WITH OR WITHOUT PELVIS) 2-3V LEFT COMPARISON:  CT 01/25/2020. FINDINGS: Degenerative changes lumbar spine and both hips. No acute bony or joint abnormality. Degenerative changes particularly prominent about the left hip. No evidence of fracture dislocation. Aortoiliac atherosclerotic vascular calcification. IMPRESSION: Degenerative changes lumbar spine and both hips. Degenerative changes particularly prominent about the left hip. No acute abnormality identified. Electronically Signed   By: Maisie Fus  Register   On: 09/17/2020 14:08    Procedures Procedures   Medications Ordered in ED Medications - No data to display  ED Course  I have reviewed the triage vital signs and the nursing notes.  Pertinent  labs & imaging results that were available during my care of the patient were reviewed by me and considered in my medical decision making (see chart for details).    MDM Rules/Calculators/A&P                         Patient with difficulty with balance, facial droop, unsteady gait.  Last seen normal 8 PM last night.  CT head obtained in triage shows no hemorrhage but does show chronic areas of infarct. Not a TPA candidate due to delay in presentation.  CT obtained shows stable stent in her MCA without evidence of stenosis or other large vessel occlusion.  Patient seen by neurology Dr. Reggie Pile.  She is having issues logging into the system.  She agrees with MRI and medical admission.  Continue Eliquis and Plavix.  Allow permissive hypertension.  Patient not a candidate for TPA or endovascular intervention  Incidentally found to be Covid positive.  UTI treated with Rocephin. After cultures obtained  Plan admission for MRI and stroke rule out.  We will continue to treat UTI.  Her CT abdomen does not show any acute pathology other than diverticulosis.  Patient made aware of lung nodule and need for follow-up.  Admission d/w Dr. Arville Care. Final Clinical Impression(s) / ED Diagnoses Final diagnoses:  None    Rx / DC Orders ED Discharge Orders    None       Khayri Kargbo, Jeannett Senior, MD 09/17/20 2352

## 2020-09-17 NOTE — ED Triage Notes (Signed)
Pt arrives via [facility] from home where family reports some AMS that began last night.ems reports stroke screen negative. Pt endorses generally feeling unwell and polydipsia. Pt also endorses L hip pain making her unable to walk. Reports that she hit her hip on something last week. EMS 183/68, HR 60, O2 94% on room air. A/ox4.

## 2020-09-17 NOTE — ED Notes (Signed)
Patient transported to CT 

## 2020-09-17 NOTE — ED Notes (Signed)
Pt and family is requesting to leave post triage. They are aware of the risk and will follow up with pcp. Iv removed, 22g right forearm, cath intact.

## 2020-09-17 NOTE — ED Notes (Signed)
PT ambulated with an unsteady gait at bedside. PT can support weight but has trouble with the balance. MD at bedside for ambulation

## 2020-09-17 NOTE — ED Notes (Signed)
ED Provider at bedside. 

## 2020-09-18 ENCOUNTER — Other Ambulatory Visit: Payer: Self-pay | Admitting: Family

## 2020-09-18 ENCOUNTER — Inpatient Hospital Stay (HOSPITAL_COMMUNITY): Payer: Medicare HMO

## 2020-09-18 DIAGNOSIS — G8929 Other chronic pain: Secondary | ICD-10-CM | POA: Diagnosis present

## 2020-09-18 DIAGNOSIS — F17210 Nicotine dependence, cigarettes, uncomplicated: Secondary | ICD-10-CM | POA: Diagnosis present

## 2020-09-18 DIAGNOSIS — U071 COVID-19: Secondary | ICD-10-CM | POA: Diagnosis present

## 2020-09-18 DIAGNOSIS — F17200 Nicotine dependence, unspecified, uncomplicated: Secondary | ICD-10-CM

## 2020-09-18 DIAGNOSIS — E876 Hypokalemia: Secondary | ICD-10-CM

## 2020-09-18 DIAGNOSIS — R509 Fever, unspecified: Secondary | ICD-10-CM | POA: Diagnosis present

## 2020-09-18 DIAGNOSIS — G458 Other transient cerebral ischemic attacks and related syndromes: Secondary | ICD-10-CM | POA: Diagnosis present

## 2020-09-18 DIAGNOSIS — R29810 Facial weakness: Secondary | ICD-10-CM | POA: Diagnosis present

## 2020-09-18 DIAGNOSIS — Z79899 Other long term (current) drug therapy: Secondary | ICD-10-CM | POA: Diagnosis not present

## 2020-09-18 DIAGNOSIS — G459 Transient cerebral ischemic attack, unspecified: Secondary | ICD-10-CM | POA: Diagnosis not present

## 2020-09-18 DIAGNOSIS — I671 Cerebral aneurysm, nonruptured: Secondary | ICD-10-CM

## 2020-09-18 DIAGNOSIS — D6489 Other specified anemias: Secondary | ICD-10-CM | POA: Diagnosis present

## 2020-09-18 DIAGNOSIS — F419 Anxiety disorder, unspecified: Secondary | ICD-10-CM | POA: Diagnosis present

## 2020-09-18 DIAGNOSIS — I129 Hypertensive chronic kidney disease with stage 1 through stage 4 chronic kidney disease, or unspecified chronic kidney disease: Secondary | ICD-10-CM | POA: Diagnosis present

## 2020-09-18 DIAGNOSIS — N1831 Chronic kidney disease, stage 3a: Secondary | ICD-10-CM | POA: Diagnosis present

## 2020-09-18 DIAGNOSIS — I739 Peripheral vascular disease, unspecified: Secondary | ICD-10-CM | POA: Diagnosis present

## 2020-09-18 DIAGNOSIS — M79605 Pain in left leg: Secondary | ICD-10-CM

## 2020-09-18 DIAGNOSIS — A419 Sepsis, unspecified organism: Secondary | ICD-10-CM | POA: Diagnosis present

## 2020-09-18 DIAGNOSIS — N390 Urinary tract infection, site not specified: Secondary | ICD-10-CM | POA: Diagnosis present

## 2020-09-18 DIAGNOSIS — J449 Chronic obstructive pulmonary disease, unspecified: Secondary | ICD-10-CM | POA: Diagnosis present

## 2020-09-18 DIAGNOSIS — Z90710 Acquired absence of both cervix and uterus: Secondary | ICD-10-CM | POA: Diagnosis not present

## 2020-09-18 DIAGNOSIS — Z8673 Personal history of transient ischemic attack (TIA), and cerebral infarction without residual deficits: Secondary | ICD-10-CM | POA: Diagnosis not present

## 2020-09-18 DIAGNOSIS — I471 Supraventricular tachycardia: Secondary | ICD-10-CM

## 2020-09-18 DIAGNOSIS — D649 Anemia, unspecified: Secondary | ICD-10-CM

## 2020-09-18 DIAGNOSIS — Z7901 Long term (current) use of anticoagulants: Secondary | ICD-10-CM | POA: Diagnosis not present

## 2020-09-18 DIAGNOSIS — Z7902 Long term (current) use of antithrombotics/antiplatelets: Secondary | ICD-10-CM | POA: Diagnosis not present

## 2020-09-18 LAB — CBC
HCT: 33.9 % — ABNORMAL LOW (ref 36.0–46.0)
Hemoglobin: 11.1 g/dL — ABNORMAL LOW (ref 12.0–15.0)
MCH: 30.3 pg (ref 26.0–34.0)
MCHC: 32.7 g/dL (ref 30.0–36.0)
MCV: 92.6 fL (ref 80.0–100.0)
Platelets: 183 10*3/uL (ref 150–400)
RBC: 3.66 MIL/uL — ABNORMAL LOW (ref 3.87–5.11)
RDW: 14.1 % (ref 11.5–15.5)
WBC: 5.4 10*3/uL (ref 4.0–10.5)
nRBC: 0 % (ref 0.0–0.2)

## 2020-09-18 LAB — D-DIMER, QUANTITATIVE: D-Dimer, Quant: 1.1 ug/mL-FEU — ABNORMAL HIGH (ref 0.00–0.50)

## 2020-09-18 LAB — BASIC METABOLIC PANEL
Anion gap: 9 (ref 5–15)
BUN: 13 mg/dL (ref 8–23)
CO2: 26 mmol/L (ref 22–32)
Calcium: 7.9 mg/dL — ABNORMAL LOW (ref 8.9–10.3)
Chloride: 101 mmol/L (ref 98–111)
Creatinine, Ser: 0.99 mg/dL (ref 0.44–1.00)
GFR, Estimated: 58 mL/min — ABNORMAL LOW (ref >60)
Glucose, Bld: 88 mg/dL (ref 70–99)
Potassium: 3.1 mmol/L — ABNORMAL LOW (ref 3.5–5.1)
Sodium: 136 mmol/L (ref 135–145)

## 2020-09-18 LAB — FERRITIN: Ferritin: 251 ng/mL (ref 11–307)

## 2020-09-18 LAB — TYPE AND SCREEN
ABO/RH(D): AB POS
Antibody Screen: NEGATIVE

## 2020-09-18 LAB — HEPATITIS B SURFACE ANTIGEN: Hepatitis B Surface Ag: NONREACTIVE

## 2020-09-18 LAB — LACTATE DEHYDROGENASE: LDH: 151 U/L (ref 98–192)

## 2020-09-18 LAB — BRAIN NATRIURETIC PEPTIDE: B Natriuretic Peptide: 974 pg/mL — ABNORMAL HIGH (ref 0.0–100.0)

## 2020-09-18 LAB — C-REACTIVE PROTEIN: CRP: 1.7 mg/dL — ABNORMAL HIGH (ref <1.0)

## 2020-09-18 LAB — MAGNESIUM: Magnesium: 1.3 mg/dL — ABNORMAL LOW (ref 1.7–2.4)

## 2020-09-18 LAB — PROCALCITONIN: Procalcitonin: <0.1 ng/mL

## 2020-09-18 LAB — FIBRINOGEN: Fibrinogen: 251 mg/dL (ref 210–475)

## 2020-09-18 MED ORDER — ONDANSETRON HCL 4 MG/2ML IJ SOLN
4.0000 mg | Freq: Four times a day (QID) | INTRAMUSCULAR | Status: DC | PRN
  Administered 2020-09-18: 4 mg via INTRAVENOUS
  Filled 2020-09-18: qty 2

## 2020-09-18 MED ORDER — GUAIFENESIN-DM 100-10 MG/5ML PO SYRP: 10.0000 mL | ORAL_SOLUTION | ORAL | Status: DC | PRN

## 2020-09-18 MED ORDER — ENOXAPARIN SODIUM 30 MG/0.3ML ~~LOC~~ SOLN: 30.0000 mg | SUBCUTANEOUS | Status: DC

## 2020-09-18 MED ORDER — HYDROCODONE-ACETAMINOPHEN 5-325 MG PO TABS
1.0000 | ORAL_TABLET | Freq: Four times a day (QID) | ORAL | Status: DC | PRN
  Administered 2020-09-18 – 2020-09-19 (×3): 1 via ORAL
  Filled 2020-09-18 (×3): qty 1

## 2020-09-18 MED ORDER — SODIUM CHLORIDE 0.9 % IV BOLUS
250.0000 mL | Freq: Once | INTRAVENOUS | Status: AC
  Administered 2020-09-18: 250 mL via INTRAVENOUS

## 2020-09-18 MED ORDER — STROKE: EARLY STAGES OF RECOVERY BOOK
Freq: Once | Status: AC
  Filled 2020-09-18: qty 1

## 2020-09-18 MED ORDER — LOPERAMIDE HCL 2 MG PO CAPS
2.0000 mg | ORAL_CAPSULE | ORAL | Status: DC | PRN
  Administered 2020-09-18: 2 mg via ORAL
  Filled 2020-09-18: qty 1

## 2020-09-18 MED ORDER — METOPROLOL SUCCINATE ER 25 MG PO TB24
25.0000 mg | ORAL_TABLET | Freq: Every day | ORAL | Status: DC
  Administered 2020-09-18 – 2020-09-19 (×2): 25 mg via ORAL
  Filled 2020-09-18 (×3): qty 1

## 2020-09-18 MED ORDER — ENOXAPARIN SODIUM 30 MG/0.3ML ~~LOC~~ SOLN
30.0000 mg | SUBCUTANEOUS | Status: DC
  Administered 2020-09-18 – 2020-09-19 (×2): 30 mg via SUBCUTANEOUS
  Filled 2020-09-18 (×2): qty 0.3

## 2020-09-18 MED ORDER — FLUTICASONE PROPIONATE 50 MCG/ACT NA SUSP: 1.0000 | Freq: Every day | NASAL | Status: DC | PRN

## 2020-09-18 MED ORDER — BUTALBITAL-APAP-CAFFEINE 50-325-40 MG PO TABS
1.0000 | ORAL_TABLET | Freq: Three times a day (TID) | ORAL | Status: DC | PRN
  Administered 2020-09-19: 1 via ORAL
  Filled 2020-09-18: qty 1

## 2020-09-18 MED ORDER — SODIUM CHLORIDE 0.9 % IV SOLN: INTRAVENOUS | Status: DC

## 2020-09-18 MED ORDER — ALPRAZOLAM 0.25 MG PO TABS
0.2500 mg | ORAL_TABLET | Freq: Three times a day (TID) | ORAL | Status: DC | PRN
  Administered 2020-09-18: 0.25 mg via ORAL
  Administered 2020-09-19 (×2): 0.5 mg via ORAL
  Filled 2020-09-18: qty 2
  Filled 2020-09-18: qty 1
  Filled 2020-09-18: qty 2

## 2020-09-18 MED ORDER — SODIUM CHLORIDE 0.9 % IV SOLN
1.0000 g | INTRAVENOUS | Status: AC
  Administered 2020-09-18 – 2020-09-19 (×2): 1 g via INTRAVENOUS
  Filled 2020-09-18 (×2): qty 10

## 2020-09-18 MED ORDER — MIRTAZAPINE 15 MG PO TABS
7.5000 mg | ORAL_TABLET | Freq: Every day | ORAL | Status: DC
  Administered 2020-09-18 – 2020-09-19 (×2): 7.5 mg via ORAL
  Filled 2020-09-18 (×2): qty 1

## 2020-09-18 MED ORDER — ZINC SULFATE 220 (50 ZN) MG PO CAPS
220.0000 mg | ORAL_CAPSULE | Freq: Every day | ORAL | Status: DC
  Administered 2020-09-18 – 2020-09-20 (×3): 220 mg via ORAL
  Filled 2020-09-18 (×3): qty 1

## 2020-09-18 MED ORDER — FAMOTIDINE 20 MG PO TABS: 20.0000 mg | ORAL_TABLET | Freq: Every evening | ORAL | Status: DC | PRN

## 2020-09-18 MED ORDER — CLOPIDOGREL BISULFATE 75 MG PO TABS
75.0000 mg | ORAL_TABLET | Freq: Every day | ORAL | Status: DC
  Administered 2020-09-18 – 2020-09-20 (×3): 75 mg via ORAL
  Filled 2020-09-18 (×3): qty 1

## 2020-09-18 MED ORDER — ACETAMINOPHEN 325 MG PO TABS
650.0000 mg | ORAL_TABLET | ORAL | Status: DC | PRN
  Administered 2020-09-18: 650 mg via ORAL
  Filled 2020-09-18: qty 2

## 2020-09-18 MED ORDER — ALBUTEROL SULFATE HFA 108 (90 BASE) MCG/ACT IN AERS
2.0000 | INHALATION_SPRAY | Freq: Four times a day (QID) | RESPIRATORY_TRACT | Status: DC | PRN
  Filled 2020-09-18: qty 6.7

## 2020-09-18 MED ORDER — POTASSIUM CHLORIDE CRYS ER 20 MEQ PO TBCR
20.0000 meq | EXTENDED_RELEASE_TABLET | ORAL | Status: AC
  Administered 2020-09-18: 20 meq via ORAL
  Filled 2020-09-18: qty 1

## 2020-09-18 MED ORDER — DICLOFENAC SODIUM 1 % EX GEL
2.0000 g | Freq: Two times a day (BID) | CUTANEOUS | Status: DC
  Administered 2020-09-18 – 2020-09-20 (×5): 2 g via TOPICAL
  Filled 2020-09-18: qty 100

## 2020-09-18 MED ORDER — FLUOXETINE HCL 20 MG PO CAPS
20.0000 mg | ORAL_CAPSULE | Freq: Two times a day (BID) | ORAL | Status: DC
  Administered 2020-09-18 – 2020-09-20 (×5): 20 mg via ORAL
  Filled 2020-09-18 (×5): qty 1

## 2020-09-18 MED ORDER — FLECAINIDE ACETATE 50 MG PO TABS
75.0000 mg | ORAL_TABLET | Freq: Two times a day (BID) | ORAL | Status: DC
Start: 2020-09-18 — End: 2020-09-20
  Administered 2020-09-18 – 2020-09-20 (×5): 75 mg via ORAL
  Filled 2020-09-18 (×6): qty 2

## 2020-09-18 MED ORDER — SODIUM CHLORIDE 0.9 % IV SOLN: Freq: Once | INTRAVENOUS | Status: AC

## 2020-09-18 MED ORDER — ASCORBIC ACID 500 MG PO TABS
500.0000 mg | ORAL_TABLET | Freq: Every day | ORAL | Status: DC
  Administered 2020-09-18 – 2020-09-20 (×3): 500 mg via ORAL
  Filled 2020-09-18 (×3): qty 1

## 2020-09-18 MED ORDER — APIXABAN 5 MG PO TABS
5.0000 mg | ORAL_TABLET | Freq: Two times a day (BID) | ORAL | Status: DC
  Administered 2020-09-18: 5 mg via ORAL
  Filled 2020-09-18: qty 1

## 2020-09-18 MED ORDER — PANTOPRAZOLE SODIUM 40 MG PO TBEC
40.0000 mg | DELAYED_RELEASE_TABLET | Freq: Every day | ORAL | Status: DC
  Administered 2020-09-18 – 2020-09-20 (×3): 40 mg via ORAL
  Filled 2020-09-18 (×3): qty 1

## 2020-09-18 MED ORDER — LOSARTAN POTASSIUM 25 MG PO TABS
25.0000 mg | ORAL_TABLET | Freq: Every day | ORAL | Status: DC
  Administered 2020-09-18 – 2020-09-20 (×3): 25 mg via ORAL
  Filled 2020-09-18 (×3): qty 1

## 2020-09-18 NOTE — Progress Notes (Signed)
Pt's daughter Matthew Saras updated at this time about the pt.

## 2020-09-18 NOTE — ED Notes (Signed)
Matthew Saras (daughter) - [phone]. Would like updates concerning pt transfer and plan of care.

## 2020-09-18 NOTE — Plan of Care (Signed)
  Problem: Education: Goal: Knowledge of disease or condition will improve Outcome: Progressing Goal: Knowledge of secondary prevention will improve Outcome: Progressing Goal: Individualized Educational Video(s) Outcome: Progressing   Problem: Coping: Goal: Will verbalize positive feelings about self Outcome: Progressing   Problem: Health Behavior/Discharge Planning: Goal: Ability to manage health-related needs will improve Outcome: Progressing   

## 2020-09-18 NOTE — Evaluation (Signed)
Physical Therapy Evaluation Patient Details Name: Barbara Hill MRN: [id number] DOB: 29-Jun-1941 Today's Date: 09/18/2020   History of Present Illness  Pt is a 80 y/o female admitted secondary to AMS and instability. Pt MRI negative for acute abnormality. Found to be COVID +. PMH includes COPD, stented R MCA aneurysm, HTN, and CVA.  Clinical Impression  Pt admitted secondary to problem above with deficits below. Pt requiring min to min guard A to stand and perform transfers this session. Pt with notable memory deficits. Pt reports family can assist 24/7. Feel she would benefit from use of RW as well as HHPT. Will continue to follow acutely.     Follow Up Recommendations Home health PT;Supervision/Assistance - 24 hour    Equipment Recommendations  Rolling walker with 5" wheels    Recommendations for Other Services       Precautions / Restrictions Precautions Precautions: Fall Restrictions Weight Bearing Restrictions: No      Mobility  Bed Mobility Overal bed mobility: Needs Assistance Bed Mobility: Sit to Supine       Sit to supine: Min assist   General bed mobility comments: Min A for LE assist.    Transfers Overall transfer level: Needs assistance Equipment used: 1 person hand held assist Transfers: Sit to/from [employer] Sit to Stand: Min assist Stand pivot transfers: Min guard       General transfer comment: Min A for lift assist and steadying to stand from [facility]. Min guard for safety to transfer from [facility] to chair. Pt also with another episode of urinary  incontinence upon return to bed that required clean up.  Ambulation/Gait                Stairs            Wheelchair Mobility    Modified Rankin (Stroke Patients Only)       Balance Overall balance assessment: Needs assistance Sitting-balance support: No upper extremity supported;Feet supported Sitting balance-Leahy Scale: Good     Standing balance support: No upper  extremity supported;During functional activity Standing balance-Leahy Scale: Fair Standing balance comment: Can maintain static standing without UE support                             Pertinent Vitals/Pain Pain Assessment: No/denies pain    Home Living Family/patient expects to be discharged to:: Private residence Living Arrangements: Children Available Help at Discharge: Family;Available 24 hours/day Type of Home: House Home Access: Stairs to enter Entrance Stairs-Rails: None Entrance Stairs-Number of Steps: 2 Home Layout: One level Home Equipment: Shower seat Additional Comments: Pt initially reporting she lived with her husband, but then reports her husband died and she lives with her kids.    Prior Function Level of Independence: Independent               Hand Dominance        Extremity/Trunk Assessment   Upper Extremity Assessment Upper Extremity Assessment: Defer to OT evaluation    Lower Extremity Assessment Lower Extremity Assessment: Generalized weakness    Cervical / Trunk Assessment Cervical / Trunk Assessment: Normal  Communication   Communication: No difficulties  Cognition Arousal/Alertness: Awake/alert Behavior During Therapy: WFL for tasks assessed/performed Overall Cognitive Status: No family/caregiver present to determine baseline cognitive functioning  General Comments: Some memory deficits noted.      General Comments      Exercises     Assessment/Plan    PT Assessment Patient needs continued PT services  PT Problem List Decreased strength;Decreased balance;Decreased mobility;Decreased cognition;Decreased safety awareness;Decreased knowledge of precautions       PT Treatment Interventions DME instruction;Functional mobility training;Therapeutic activities;Therapeutic exercise;Balance training;Stair training;Gait training;Cognitive remediation;Patient/family education     PT Goals (Current goals can be found in the Care Plan section)  Acute Rehab PT Goals Patient Stated Goal: to go home PT Goal Formulation: With patient Time For Goal Achievement: 10/03/20 Potential to Achieve Goals: Good    Frequency Min 3X/week   Barriers to discharge        Co-evaluation               AM-PAC PT "6 Clicks" Mobility  Outcome Measure Help needed turning from your back to your side while in a flat bed without using bedrails?: A Little Help needed moving from lying on your back to sitting on the side of a flat bed without using bedrails?: A Little Help needed moving to and from a bed to a chair (including a wheelchair)?: A Little Help needed standing up from a chair using your arms (e.g., wheelchair or bedside chair)?: A Little Help needed to walk in hospital room?: A Little Help needed climbing 3-5 steps with a railing? : A Little 6 Click Score: 18    End of Session Equipment Utilized During Treatment: Gait belt Activity Tolerance: Patient tolerated treatment well Patient left: in bed;with call bell/phone within reach;with bed alarm set Nurse Communication: Mobility status PT Visit Diagnosis: Unsteadiness on feet (R26.81)    Time: 5615-3794 PT Time Calculation (min) (ACUTE ONLY): 20 min   Charges:   PT Evaluation $PT Eval Moderate Complexity: 1 Mod          Farley Ly, PT, DPT  Acute Rehabilitation Services  Pager: [phone] Office: [phone]   Lehman Prom 09/18/2020, 2:27 PM

## 2020-09-18 NOTE — Progress Notes (Signed)
Lower extremity venous has been completed.   Preliminary results in CV Proc.   Blanch Media 09/18/2020 2:40 PM

## 2020-09-18 NOTE — TOC Initial Note (Signed)
Transition of Care [facility]) - Initial/Assessment Note    Patient Details  Name: Barbara Hill MRN: [id number] Date of Birth: 10/31/1940  Transition of Care [facility]) CM/SW Contact:    Kermit Balo, RN Phone Number: 09/18/2020, 3:48 PM  Clinical Narrative:                 Due to pt being covid +, CM called into the room and spoke to pts daughters. She states that she and her family live with the patient and she has the needed supervision. Daughter requesting rollator for home. CM notified PT and they feels this is appropriate. CM has ordered the DME through [employer]. Home health services arranged through [facility].  TOC following for further d/c needs.    Expected Discharge Plan: Home w Home Health Services Barriers to Discharge: Continued Medical Work up   Patient Goals and CMS Choice   CMS Medicare.gov Compare Post Acute Care list provided to:: Patient Represenative (must comment) Choice offered to / list presented to : Adult Children  Expected Discharge Plan and Services Expected Discharge Plan: Home w Home Health Services   Discharge Planning Services: CM Consult Post Acute Care Choice: Home Health,Durable Medical Equipment Living arrangements for the past 2 months: Single Family Home                 DME Arranged: Walker rolling with seat DME Agency: [employer] Date DME Agency Contacted: 09/18/20   Representative spoke with at DME Agency: Velna Hatchet HH Arranged: PT,OT HH Agency: [facility] ([facility] Date HH Agency Contacted: 09/18/20   Representative spoke with at [facility] Agency: [place]  Prior Living Arrangements/Services Living arrangements for the past 2 months: Single Family Home Lives with:: Adult Children Patient language and need for interpreter reviewed:: Yes Do you feel safe going back to the place where you live?: Yes      Need for Family Participation in Patient Care: Yes (Comment) Care giver support system in place?: Yes  (comment)      Activities of Daily Living      Permission Sought/Granted                  Emotional Assessment           Psych Involvement: No (comment)  Admission diagnosis:  TIA (transient ischemic attack) [G45.9] Ataxic gait [R26.0] Acute cystitis without hematuria [N30.00] Patient Active Problem List   Diagnosis Date Noted  . TIA (transient ischemic attack) 09/17/2020  . Protein-calorie malnutrition, severe 02/05/2020  . Ischemic leg 01/25/2020  . COPD (chronic obstructive pulmonary disease) (HCC) 01/25/2020  . Normocytic anemia 01/25/2020  . History of CVA (cerebrovascular accident) 01/25/2020  . Anxiety 01/25/2020  . CKD (chronic kidney disease), stage III (HCC) 01/25/2020  . 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 Brain aneurysm 06/29/2016  . Lumbar contusion   . Orthostatic hypotension   . First degree AV block   . Hypokalemia   . Syncope 04/10/2016  . Paroxysmal SVT (supraventricular tachycardia) (HCC) 04/21/2011  . 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 Tobacco use disorder 04/21/2011   PCP:  Richmond Campbell., PA-C Pharmacy:   [facility] [phone] - [place], [place] - [address] AT [facility] [address] [place] [address] Phone: [phone] Fax: [phone]  [facility] - [place], [place] - [address] [place] [address] Phone:  [phone] Fax: [phone]     Social Determinants of Health (SDOH) Interventions    Readmission Risk Interventions No flowsheet data found.

## 2020-09-18 NOTE — Progress Notes (Signed)
Pt w/ temp 100.34F and BP 93/62, MEWs yellow. I have informed admission of pt being on the unit, in addition, I have informed both Dr. Loney Loh and [facility] pertaining to pt'status.

## 2020-09-18 NOTE — H&P (Signed)
History and Physical    [name]:[id number] DOB: 1941-02-14 DOA: 09/17/2020  Referring MD/NP/PA:.Kakrakandy, MD PCP: Richmond Campbell., PA-C  Patient coming from: [facility] transfer  Chief Complaint: Left shoulder and hip pain  I have personally briefly reviewed patient's old medical records in [facility]   HPI: Barbara Hill is a 80 y.o. 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  History is obtained from the patient as well as her daughter over the phone.  The patient was witnessed falling over the chair at home yesterday morning and daughter thought that the left side of her mother's face was drooping.  The patient reports that she had bumped into her bed on left side and the following day noticed a lump develop that it was very painful. Due to her history of popliteal occlusion she was concerned for the possibility of blood clot.   Also complaining of having left shoulder pain for which she is unable to move her left arm much, but denied any recent injury to the arm.  She did not receive any COVID-19 vaccinations.  Over the last day she reports that she has had dysuria, intermittent nonproductive cough,  headaches, subjective fever, and diarrhea.  Denies any blood in stool/urine, chest pain, leg swelling, shortness of breath, loss of consciousness, or change in speech. Patient had previously been on Eliquis, but is not clear on why this medication had been discontinued or if she just never had the medication refilled.  Review of records does note office visit on 09/02/2020 for hematoma of the left hip.  She does continue to smoke 4 to 5 cigarettes/day on average.   ED Course: Upon admission into the emergency department patient was seen to be febrile up to 100.6 F, pulse 56-63, blood pressure 93/62-193/73, and O2  saturations maintained on room air. Telemetry neurology have been consulted CT of the head showed no acute intercranial abnormality, but did note chronic right basal ganglia lacunar infarcts. Patient was not a candidate for TPA given anticoagulation. Labs significant for hemoglobin 11.2, potassium 3.3, BUN 19, creatinine 1.13, albumin 3.4, and AST 43. CTA of the head and neck significant for stent of the right MCA with 3mm aneurysm slightly increased from the 2.5 seen on 12/2017 and a 1-2 mm anterior communicating artery complex may reflect small aneurysm. Patient had received acetaminophen 650 mg, Zofran, IV fluids, and Rocephin IV.  Review of Systems  Constitutional: Positive for fever.  HENT: Negative for congestion and sore throat.   Eyes: Negative for photophobia and pain.  Respiratory: Positive for cough. Negative for wheezing.   Cardiovascular: Negative for chest pain and leg swelling.  Gastrointestinal: Positive for abdominal pain, diarrhea and nausea. Negative for vomiting.  Genitourinary: Positive for dysuria and frequency.  Musculoskeletal: Positive for joint pain and myalgias. Negative for falls.  Skin: Negative for rash.  Neurological: Positive for focal weakness. Negative for loss of consciousness and headaches.  Endo/Heme/Allergies: Positive for environmental allergies.  Psychiatric/Behavioral: Negative for substance abuse.    Past Medical History:  Diagnosis Date  . Anxiety   . Brain aneurysm    pipeline stent 2017   . COPD (chronic obstructive pulmonary disease) (HCC)   . Fatigue   . H/O: hysterectomy   . Headache(784.0)   . History of cardiac catheterization    LHC 10/02: Normal coronary arteries  .  History of nuclear stress test    Myoview 10/12: Normal stress nuclear study.  . Hypertension   . Mitral valve prolapse    Echo 8/17:Mild LVH, EF 60-65, normal wall motion, grade 2 diastolic dysfunction, mild AI, mild MR, mild LAE // echo 11/01: EF 60%, mild anterior MVP  with trivial MR, PASP 35-40 mmHg, LAE  . Orthostasis   . PSVT (paroxysmal supraventricular tachycardia) (HCC)    palps  . Radiculopathy     Past Surgical History:  Procedure Laterality Date  . ABDOMINAL HYSTERECTOMY    . APPENDECTOMY    . BUBBLE STUDY  01/29/2020   Procedure: BUBBLE STUDY;  Surgeon: Parke Poisson, MD;  Location: [facility] ENDOSCOPY;  Service: Cardiology;;  . CARDIAC CATHETERIZATION  12/95  . CATARACT EXTRACTION    . CHOLECYSTECTOMY    . EMBOLECTOMY Bilateral 01/25/2020   Procedure: Bilateral popliteal EMBOLECTOMY.;  Surgeon: [name], MD;  Location: [facility] OR;  Service: Vascular;  Laterality: Bilateral;  . FASCIOTOMY Bilateral 01/26/2020   Procedure: FASCIOTOMY;  Surgeon: [name], MD;  Location: [facility] OR;  Service: Vascular;  Laterality: Bilateral;  . IR GENERIC HISTORICAL  06/02/2016   IR RADIOLOGIST EVAL & MGMT 06/02/2016 [facility]  . IR GENERIC HISTORICAL  06/29/2016   IR ANGIO INTRA EXTRACRAN SEL INTERNAL CAROTID UNI R MOD SED 06/29/2016 [name], MD [facility]  . IR GENERIC HISTORICAL  06/29/2016   IR ANGIOGRAM FOLLOW UP STUDY 06/29/2016 [name], MD [facility]  . IR GENERIC HISTORICAL  06/29/2016   IR NEURO EACH ADD'L AFTER BASIC UNI RIGHT (MS) 06/29/2016 [name], MD [facility]  . IR GENERIC HISTORICAL  06/29/2016   IR 3D INDEPENDENT WKST 06/29/2016 [name], MD [facility]  . IR GENERIC HISTORICAL  06/29/2016   IR TRANSCATH/EMBOLIZ 06/29/2016 [name], MD [facility]  . IR GENERIC HISTORICAL  06/29/2016   IR ANGIO VERTEBRAL SEL SUBCLAVIAN INNOMINATE UNI R MOD SED 06/29/2016 [name], MD [facility]  . IR GENERIC HISTORICAL  07/28/2016   IR RADIOLOGIST EVAL & MGMT 07/28/2016 [facility]  . IR GENERIC HISTORICAL  11/12/2016   IR ANGIO INTRA EXTRACRAN SEL COM CAROTID INNOMINATE BILAT MOD SED 11/12/2016 [name], MD [facility]  . IR GENERIC HISTORICAL  11/12/2016   IR ANGIO VERTEBRAL SEL  VERTEBRAL UNI L MOD SED 11/12/2016 [name], MD [facility]  . IR GENERIC HISTORICAL  11/12/2016   IR ANGIO VERTEBRAL SEL SUBCLAVIAN INNOMINATE UNI R MOD SED 11/12/2016 [name], MD [facility]  . NASAL SEPTUM SURGERY    . RADIOLOGY WITH ANESTHESIA N/A 06/29/2016   Procedure: EMBOLIZATION;  Surgeon: [name], MD;  Location: [facility] OR;  Service: Radiology;  Laterality: N/A;  . ROTATOR CUFF REPAIR    . TEE WITHOUT CARDIOVERSION N/A 01/29/2020   Procedure: TRANSESOPHAGEAL ECHOCARDIOGRAM (TEE);  Surgeon: Parke Poisson, MD;  Location: [facility] ENDOSCOPY;  Service: Cardiology;  Laterality: N/A;     reports that she has been smoking cigarettes. She has a 50.00 pack-year smoking history. She uses smokeless tobacco. She reports that she does not drink alcohol and does not use drugs.  Allergies  Allergen Reactions  . Dobutamine Other (See Comments)    Heart beating hard with CP, neck pain, and weakness  . Epinephrine Other (See Comments)    Fast heart beat  . Darvocet [Propoxyphene N-Acetaminophen] Nausea And Vomiting  . Excedrin Extra Strength [Aspirin-Acetaminophen-Caffeine] Nausea And Vomiting  . Aspirin   . Clarithromycin Other (See Comments)  Yeast infection  . Doxycycline Other (See Comments)    Makes stomach hurt  . Penicillins Other (See Comments)    Yeast infection  . Prednisone Other (See Comments)    Shakes   . Propoxyphene Nausea And Vomiting    Family History  Problem Relation Age of Onset  . Heart attack Mother   . Transient ischemic attack Mother   . Heart attack Father 26  . Heart attack Other   . Asthma Other   . Heart failure Other   . Osteoporosis Other   . Heart Problems Brother   . CAD Other     Prior to Admission medications   Medication Sig Start Date End Date Taking? Authorizing Provider  ALPRAZolam [name]) 0.5 MG tablet Take 1 tablet (0.5 mg total) by mouth 3 (three) times daily as needed for anxiety. 02/09/20  Yes Almon Hercules, MD   butalbital-acetaminophen-caffeine (FIORICET) 50-325-40 MG tablet Take 1 tablet by mouth every 8 (eight) hours as needed for headache or migraine. 02/09/20  Yes Almon Hercules, MD  flecainide (TAMBOCOR) 50 MG tablet Take 1 tablet (50 mg total) by mouth 2 (two) times daily. 07/29/20  Yes Pricilla Riffle, MD  FLUoxetine (PROZAC) 20 MG capsule Take 20 mg by mouth 2 (two) times daily. 06/07/16  Yes [provider]  losartan (COZAAR) 25 MG tablet Take 25 mg by mouth daily.   Yes [provider]  metoprolol succinate (TOPROL-XL) 25 MG 24 hr tablet TAKE 1 TABLET BY MOUTH EVERY DAY AT BEDTIME 06/20/20  Yes Pricilla Riffle, MD  omeprazole (PRILOSEC) 40 MG capsule Take 40 mg by mouth daily.   Yes [provider]  acetaminophen (TYLENOL) 650 MG CR tablet Take 1,300 mg by mouth every 8 (eight) hours as needed for pain.    [provider]  apixaban (ELIQUIS) 5 MG TABS tablet Take 1 tablet (5 mg total) by mouth 2 (two) times daily. 02/09/20   Almon Hercules, MD  cholecalciferol (VITAMIN D) 1000 units tablet Take 1,000 Units by mouth daily.    [provider]  clopidogrel (PLAVIX) 75 MG tablet Take 75 mg by mouth daily. 06/26/20   [provider]  feeding supplement, ENSURE ENLIVE, (ENSURE ENLIVE) LIQD Take 237 mLs by mouth 2 (two) times daily between meals. Patient not taking: Reported on 02/13/2020 02/09/20   Almon Hercules, MD  ferrous sulfate 325 (65 FE) MG tablet Take 1 tablet (325 mg total) by mouth 2 (two) times daily with a meal. Patient not taking: Reported on 02/13/2020 02/09/20 08/07/20  Almon Hercules, MD  fluticasone (FLONASE) 50 MCG/ACT nasal spray Place 1 spray into both nostrils daily as needed for allergies or rhinitis.  10/11/14   [provider]  Multiple Vitamin (MULTIVITAMIN WITH MINERALS) TABS tablet Take 1 tablet by mouth daily. 02/09/20   Almon Hercules, MD  polyethylene glycol powder (MIRALAX) 17 GM/SCOOP powder Take 17 g by mouth 2 (two) times  daily as needed for moderate constipation. 02/09/20   Almon Hercules, MD  Probiotic Product (PROBIOTIC PO) Take liquid probiotic by mouth twice daily (Activia)  Patient not taking: Reported on 02/13/2020    [provider]  senna-docusate (SENOKOT-S) 8.6-50 MG tablet Take 1 tablet by mouth 2 (two) times daily between meals as needed for mild constipation. 02/09/20   Almon Hercules, MD  vitamin B-12 1000 MCG tablet Take 1 tablet (1,000 mcg total) by mouth daily. Patient not taking: Reported on 02/13/2020 02/09/20  Almon Hercules, MD    Physical Exam:  Constitutional: Thin frail elderly female Vitals:   09/18/20 0230 09/18/20 0410 09/18/20 0602 09/18/20 0907  BP: (!) 180/74 93/62 (!) 156/68 (!) 184/69  Pulse: (!) 58 63 61 62  Resp: 14 18  18   Temp:  (!) 100.6 F (38.1 C) 98.5 F (36.9 C) 97.7 F (36.5 C)  TempSrc:  Oral Oral Oral  SpO2: 94% 95% 96% 95%  Weight:  44.4 kg    Height:  5\' 3"  (1.6 m)     Eyes: PERRL, lids and conjunctivae normal ENMT: Mucous membranes are moist. Posterior pharynx clear of any exudate or lesions.  Neck: normal, supple, no masses, no thyromegaly Respiratory: clear to auscultation bilaterally, no wheezing, no crackles. Normal respiratory effort. No accessory muscle use.  Cardiovascular: Regular rate and rhythm, no murmurs / rubs / gallops. No extremity edema. 2+ pedal pulses. No carotid bruits.  Abdomen: no tenderness, no masses palpated. No hepatosplenomegaly. Bowel sounds positive.  Musculoskeletal: no clubbing / cyanosis. No joint deformity upper and lower extremities. Good ROM, no contractures. Normal muscle tone.  Skin: no rashes, lesions, ulcers. No induration Neurologic: CN 2-12 grossly intact.  Patient with 4/5 strength in the left upper and lower extremity related to pain with movement.  Strength 5/5 in all other extremities.  No slurred speech appreciated at this time. Psychiatric: Normal judgment and insight. Alert and oriented x 3. Normal mood.      Labs on Admission: I have personally reviewed following labs and imaging studies  CBC: Recent Labs  Lab 09/17/20 1324 09/17/20 1817  WBC 4.4 5.6  NEUTROABS  --  3.2  HGB 10.6* 11.2*  HCT 32.6* 33.9*  MCV 92.9 91.4  PLT 206 206   Basic Metabolic Panel: Recent Labs  Lab 09/17/20 1324 09/17/20 1817  NA 138 136  K 3.4* 3.3*  CL 104 101  CO2 23 26  GLUCOSE 92 100*  BUN 17 19  CREATININE 1.09* 1.13*  CALCIUM 8.8* 8.8*   GFR: Estimated Creatinine Clearance: 28.8 mL/min (A) (by C-G formula based on SCr of 1.13 mg/dL (H)). Liver Function Tests: Recent Labs  Lab 09/17/20 1324 09/17/20 1817  AST 41 43*  ALT 19 20  ALKPHOS 84 82  BILITOT 0.2* 0.3  PROT 6.9 7.2  ALBUMIN 3.3* 3.4*   No results for input(s): LIPASE, AMYLASE in the last 168 hours. No results for input(s): AMMONIA in the last 168 hours. Coagulation Profile: No results for input(s): INR, PROTIME in the last 168 hours. Cardiac Enzymes: No results for input(s): CKTOTAL, CKMB, CKMBINDEX, TROPONINI in the last 168 hours. BNP (last 3 results) No results for input(s): PROBNP in the last 8760 hours. HbA1C: No results for input(s): HGBA1C in the last 72 hours. CBG: No results for input(s): GLUCAP in the last 168 hours. Lipid Profile: No results for input(s): CHOL, HDL, LDLCALC, TRIG, CHOLHDL, LDLDIRECT in the last 72 hours. Thyroid Function Tests: No results for input(s): TSH, T4TOTAL, FREET4, T3FREE, THYROIDAB in the last 72 hours. Anemia Panel: No results for input(s): VITAMINB12, FOLATE, FERRITIN, TIBC, IRON, RETICCTPCT in the last 72 hours. Urine analysis:    Component Value Date/Time   COLORURINE YELLOW 09/17/2020 1817   APPEARANCEUR CLOUDY (A) 09/17/2020 1817   APPEARANCEUR Clear 09/08/2012 0030   LABSPEC 1.030 09/17/2020 1817   LABSPEC 1.013 09/08/2012 0030   PHURINE 6.0 09/17/2020 1817   GLUCOSEU NEGATIVE 09/17/2020 1817   GLUCOSEU Negative 09/08/2012 0030   HGBUR LARGE (A) 09/17/2020  1817    BILIRUBINUR NEGATIVE 09/17/2020 1817   BILIRUBINUR Negative 09/08/2012 0030   KETONESUR NEGATIVE 09/17/2020 1817   PROTEINUR NEGATIVE 09/17/2020 1817   UROBILINOGEN 0.2 01/18/2015 2006   NITRITE POSITIVE (A) 09/17/2020 1817   LEUKOCYTESUR MODERATE (A) 09/17/2020 1817   LEUKOCYTESUR Negative 09/08/2012 0030   Sepsis Labs: Recent Results (from the past 240 hour(s))  SARS Coronavirus 2 by RT PCR (hospital order, performed in [facility] hospital lab) Nasopharyngeal Nasopharyngeal Swab     Status: Abnormal   Collection Time: 09/17/20  8:44 PM   Specimen: Nasopharyngeal Swab  Result Value Ref Range Status   SARS Coronavirus 2 POSITIVE (A) NEGATIVE Final    Comment: RESULT CALLED TO, READ BACK BY AND VERIFIED WITH: KELLIE NEAL RN ON 09/17/20 AT 2158 [facility] (NOTE) SARS-CoV-2 target nucleic acids are DETECTED  SARS-CoV-2 RNA is generally detectable in upper respiratory specimens  during the acute phase of infection.  Positive results are indicative  of the presence of the identified virus, but do not rule out bacterial infection or co-infection with other pathogens not detected by the test.  Clinical correlation with patient history and  other diagnostic information is necessary to determine patient infection status.  The expected result is negative.  Fact Sheet for Patients:   BoilerBrush.com.cy   Fact Sheet for Healthcare Providers:   https://pope.com/    This test is not yet approved or cleared by the [place] FDA and  has been authorized for detection and/or diagnosis of SARS-CoV-2 by FDA under an Emergency Use Authorization (EUA).  This EUA will remain in effect (meaning this  test can be used) for the duration of  the COVID-19 declaration under Section 564(b)(1) of the Act, 21 U.S.C. section 360-bbb-3(b)(1), unless the authorization is terminated or revoked sooner.  Performed at [facility], [address]  [place], [place] [address]      Radiological Exams on Admission: CT Angio Head W or Wo Contrast  Result Date: 09/17/2020 CLINICAL DATA:  Neuro deficit, acute, stroke suspected. Weakness, dizziness, facial droop this morning. History of brain aneurysm. EXAM: CT ANGIOGRAPHY HEAD AND NECK TECHNIQUE: Multidetector CT imaging of the head and neck was performed using the standard protocol during bolus administration of intravenous contrast. Multiplanar CT image reconstructions and MIPs were obtained to evaluate the vascular anatomy. Carotid stenosis measurements (when applicable) are obtained utilizing NASCET criteria, using the distal internal carotid diameter as the denominator. CONTRAST:  75mL OMNIPAQUE IOHEXOL 350 MG/ML SOLN COMPARISON:  Noncontrast head CT performed earlier today 09/17/2020. CT angiogram FINDINGS: CTA NECK FINDINGS Aortic arch: Atherosclerotic plaque within the visualized aortic arch and proximal major branch vessels of the neck. The origins of the innominate and left common carotid arteries are excluded from the field of view. Within this limitation, no hemodynamically significant innominate or proximal subclavian artery stenosis. Right carotid system: CCA and ICA patent within the neck without stenosis. No significant atherosclerotic disease. Left carotid system: The origin of the common carotid artery is not included in the field of view. Within this limitation, the CCA and ICA are patent within the neck without stenosis. No significant atherosclerotic disease. Vertebral arteries: Codominant and patent within the neck without stenosis. Minimal calcified plaque within the V1 right vertebral artery. Skeleton: No acute bony abnormality or aggressive osseous lesion. Trace C2-C3 grade 1 retrolisthesis. 2 mm C4-C5 grade 1 anterolisthesis. Trace C6-C7 grade 1 retrolisthesis. Cervical spondylosis. Most notably, there is advanced C5-C6 disc space narrowing. Other neck: No neck  mass or cervical  lymphadenopathy. Subcentimeter right thyroid lobe nodule not meeting consensus criteria for ultrasound follow-up Upper chest: No consolidation within the imaged lung apices. Centrilobular emphysema. Review of the MIP images confirms the above findings CTA HEAD FINDINGS Anterior circulation: The intracranial internal carotid arteries are patent. Nonstenotic calcified plaque within both vessels. Redemonstrated flow diverter device within the M1 right MCA. Stent artifact size limits evaluation for in stent stenosis. However, there is flow proximal and distal to the flow diverter consistent with stent patency. A 3 mm aneurysm at site of the flow diverter has slightly increased in size as compared to the CTA of 01/07/2018 (previously 2.5 mm) (series 7, image 248). The M1 left middle cerebral artery is patent. No M2 proximal branch occlusion or high-grade proximal stenosis is identified. The anterior cerebral arteries are patent. 1-2 mm vascular protrusion projecting anteriorly from the anterior communicating artery complex (series 7, image 245) (series 9, image 110). In retrospect, this finding was present on the prior CT a of 01/07/2018 Posterior circulation: The intracranial vertebral arteries are patent. The basilar artery is patent. The posterior cerebral arteries are patent. A right posterior communicating artery is present. The left posterior communicating artery is hypoplastic or absent. Venous sinuses: Within the limitations of contrast timing, no convincing thrombus. Anatomic variants: As described Review of the MIP images confirms the above findings IMPRESSION: CTA neck: The origins of the innominate and left common carotid arteries are excluded from the field of view. Within this limitation, the common carotid, internal carotid and vertebral arteries are patent within the neck without hemodynamically significant stenosis. CTA head: 1. Redemonstrated flow diverter device within the M1 right middle cerebral  artery. A 3 mm aneurysm at this site has slightly increased in size as compared to the CTA head of 01/07/2018 (previously 2.5 mm) 2. Stent artifact limits evaluation for right MCA in-stent stenosis. However, flow is present proximal and distal to the stent consistent with stent patency. 3. Elsewhere, no intracranial large vessel occlusion or proximal high-grade arterial stenosis is identified. 4. 1-2 mm anteriorly projecting vascular protrusion arising from the anterior communicating artery complex. This may reflect an additional small aneurysm or the origin of an otherwise poorly delineated small-vessel. In retrospect, this finding was present on the prior CTA. Electronically Signed   By: Jackey LogeKyle  Golden DO   On: 09/17/2020 20:19   CT Head Wo Contrast  Result Date: 09/17/2020 CLINICAL DATA:  Transient ischemic attack (TIA). Additional history provided: Weakness, dizziness, facial droop this morning. EXAM: CT HEAD WITHOUT CONTRAST TECHNIQUE: Contiguous axial images were obtained from the base of the skull through the vertex without intravenous contrast. COMPARISON:  Brain MRI 07/01/2016.  CT a head/neck 01/07/2018. FINDINGS: Brain: Mild cerebral and cerebellar atrophy. Redemonstrated chronic lacunar infarct within right caudate nucleus/internal capsule genu. Additional the infarct within the right lentiform nucleus. Background mild ill-defined hypoattenuation within the cerebral white matter is nonspecific but compatible with chronic small vessel ischemic disease. There is no acute intracranial hemorrhage. No demarcated cortical infarct. No extra-axial fluid collection. No evidence of intracranial mass. No midline shift. Vascular: No hyperdense vessel. Redemonstrated flow diverter device in the region of the proximal right middle cerebral artery. Skull: Normal. Negative for fracture or focal lesion. Sinuses/Orbits: Visualized orbits show no acute finding. No significant paranasal sinus disease at the imaged levels.  IMPRESSION: No evidence of acute intracranial abnormality. Redemonstrated chronic right basal ganglia lacunar infarcts. Background mild generalized atrophy of the brain and mild chronic small vessel ischemic disease.  Redemonstrated flow diverter device in the region of the proximal right middle cerebral artery. Electronically Signed   By: Jackey Loge DO   On: 09/17/2020 16:26   CT Angio Neck W and/or Wo Contrast  Result Date: 09/17/2020 CLINICAL DATA:  Neuro deficit, acute, stroke suspected. Weakness, dizziness, facial droop this morning. History of brain aneurysm. EXAM: CT ANGIOGRAPHY HEAD AND NECK TECHNIQUE: Multidetector CT imaging of the head and neck was performed using the standard protocol during bolus administration of intravenous contrast. Multiplanar CT image reconstructions and MIPs were obtained to evaluate the vascular anatomy. Carotid stenosis measurements (when applicable) are obtained utilizing NASCET criteria, using the distal internal carotid diameter as the denominator. CONTRAST:  82mL OMNIPAQUE IOHEXOL 350 MG/ML SOLN COMPARISON:  Noncontrast head CT performed earlier today 09/17/2020. CT angiogram FINDINGS: CTA NECK FINDINGS Aortic arch: Atherosclerotic plaque within the visualized aortic arch and proximal major branch vessels of the neck. The origins of the innominate and left common carotid arteries are excluded from the field of view. Within this limitation, no hemodynamically significant innominate or proximal subclavian artery stenosis. Right carotid system: CCA and ICA patent within the neck without stenosis. No significant atherosclerotic disease. Left carotid system: The origin of the common carotid artery is not included in the field of view. Within this limitation, the CCA and ICA are patent within the neck without stenosis. No significant atherosclerotic disease. Vertebral arteries: Codominant and patent within the neck without stenosis. Minimal calcified plaque within the V1  right vertebral artery. Skeleton: No acute bony abnormality or aggressive osseous lesion. Trace C2-C3 grade 1 retrolisthesis. 2 mm C4-C5 grade 1 anterolisthesis. Trace C6-C7 grade 1 retrolisthesis. Cervical spondylosis. Most notably, there is advanced C5-C6 disc space narrowing. Other neck: No neck mass or cervical lymphadenopathy. Subcentimeter right thyroid lobe nodule not meeting consensus criteria for ultrasound follow-up Upper chest: No consolidation within the imaged lung apices. Centrilobular emphysema. Review of the MIP images confirms the above findings CTA HEAD FINDINGS Anterior circulation: The intracranial internal carotid arteries are patent. Nonstenotic calcified plaque within both vessels. Redemonstrated flow diverter device within the M1 right MCA. Stent artifact size limits evaluation for in stent stenosis. However, there is flow proximal and distal to the flow diverter consistent with stent patency. A 3 mm aneurysm at site of the flow diverter has slightly increased in size as compared to the CTA of 01/07/2018 (previously 2.5 mm) (series 7, image 248). The M1 left middle cerebral artery is patent. No M2 proximal branch occlusion or high-grade proximal stenosis is identified. The anterior cerebral arteries are patent. 1-2 mm vascular protrusion projecting anteriorly from the anterior communicating artery complex (series 7, image 245) (series 9, image 110). In retrospect, this finding was present on the prior CT a of 01/07/2018 Posterior circulation: The intracranial vertebral arteries are patent. The basilar artery is patent. The posterior cerebral arteries are patent. A right posterior communicating artery is present. The left posterior communicating artery is hypoplastic or absent. Venous sinuses: Within the limitations of contrast timing, no convincing thrombus. Anatomic variants: As described Review of the MIP images confirms the above findings IMPRESSION: CTA neck: The origins of the innominate  and left common carotid arteries are excluded from the field of view. Within this limitation, the common carotid, internal carotid and vertebral arteries are patent within the neck without hemodynamically significant stenosis. CTA head: 1. Redemonstrated flow diverter device within the M1 right middle cerebral artery. A 3 mm aneurysm at this site has slightly increased in size as  compared to the CTA head of 01/07/2018 (previously 2.5 mm) 2. Stent artifact limits evaluation for right MCA in-stent stenosis. However, flow is present proximal and distal to the stent consistent with stent patency. 3. Elsewhere, no intracranial large vessel occlusion or proximal high-grade arterial stenosis is identified. 4. 1-2 mm anteriorly projecting vascular protrusion arising from the anterior communicating artery complex. This may reflect an additional small aneurysm or the origin of an otherwise poorly delineated small-vessel. In retrospect, this finding was present on the prior CTA. Electronically Signed   By: Jackey Loge DO   On: 09/17/2020 20:19   CT ABDOMEN PELVIS W CONTRAST  Result Date: 09/17/2020 CLINICAL DATA:  80 year old female with abdominal pain. EXAM: CT ABDOMEN AND PELVIS WITH CONTRAST TECHNIQUE: Multidetector CT imaging of the abdomen and pelvis was performed using the standard protocol following bolus administration of intravenous contrast. CONTRAST:  34mL OMNIPAQUE IOHEXOL 350 MG/ML SOLN COMPARISON:  CT abdomen pelvis dated 02/15/2015. FINDINGS: Lower chest: There is a 12 x 13 mm nodule at the right lung base. Further evaluation with dedicated chest CT is recommended. There are bibasilar linear atelectasis/scarring. No intra-abdominal free air or free fluid. Hepatobiliary: The liver is unremarkable. No intrahepatic biliary dilatation. Cholecystectomy. Pancreas: Unremarkable. No pancreatic ductal dilatation or surrounding inflammatory changes. Spleen: Normal in size without focal abnormality. Adrenals/Urinary  Tract: The adrenal glands unremarkable. There is no hydronephrosis on either side. There is symmetric enhancement and excretion of contrast by both kidneys. Bilateral renal cysts measure up to 2 cm in the upper pole of the right kidney. Smaller bilateral hypodensities are not characterized. The urinary bladder is grossly unremarkable. Stomach/Bowel: There is severe sigmoid diverticulosis without active inflammatory changes. There is no bowel obstruction or active inflammation. Moderate stool noted in the rectal vault. Appendectomy. Vascular/Lymphatic: Moderate aortoiliac atherosclerotic disease. The IVC is unremarkable. No portal venous gas. There is no adenopathy. Reproductive: Hysterectomy. No adnexal masses. Other: None Musculoskeletal: Osteopenia with degenerative changes of the spine. No acute osseous pathology. IMPRESSION: 1. No acute intra-abdominal or pelvic pathology. 2. Severe sigmoid diverticulosis. No bowel obstruction. 3. A 12 x 13 mm right lung base nodule. Dedicated chest CT is recommended. 4. Aortic Atherosclerosis (ICD10-I70.0). Electronically Signed   By: Elgie Collard M.D.   On: 09/17/2020 19:34   DG Hip Unilat With Pelvis 2-3 Views Left  Result Date: 09/17/2020 CLINICAL DATA:  Left hip injury. EXAM: DG HIP (WITH OR WITHOUT PELVIS) 2-3V LEFT COMPARISON:  CT 01/25/2020. FINDINGS: Degenerative changes lumbar spine and both hips. No acute bony or joint abnormality. Degenerative changes particularly prominent about the left hip. No evidence of fracture dislocation. Aortoiliac atherosclerotic vascular calcification. IMPRESSION: Degenerative changes lumbar spine and both hips. Degenerative changes particularly prominent about the left hip. No acute abnormality identified. Electronically Signed   By: Maisie Fus  Register   On: 09/17/2020 14:08    EKG: Independently reviewed.  Sinus rhythm at 60 bpm  Assessment/Plan TIA: Acute patient daughter reported patient falling for which there was concern  for issue with her bowel and noted of left-sided facial droop.  Initial CT imaging negative for any acute abnormality.  Left arm and leg weakness appears more secondary to pain.  Speech intact and no significant left-sided facial droop appreciated at this time -Admit to a medical telemetry bed -Neuro checks -Check MRI of the brain -PT/OT/speech to evaluate -Formally consult neurology,  if MRI of the brain abnormal  Urinary tract infection with hematuria: Patient noted to complain of dysuria.  Urinalysis positive for  large hemoglobin, moderate leukocytes, positive nitrites, many bacteria, 11-20 WBCs. -Follow-up urine culture -Continue Rocephin IV  COVID-19 infection: Patient is unvaccinated and had complaints of mild cough.  Incidentally found to be positive for COVID-19.  Patient with low-grade fever 100.6 F on admission.  Currently O2 saturations maintained on room air. -Airborne and contact precautions -Check chest x-ray -Albuterol inhaler as needed for shortness of breath or wheezing -Check inflammatory markers and continue to monitor daily for now -Zinc and vitamin C  Left shoulder and left hip pain: Acute patient with prior history of popliteal occlusions. -Check vascular Doppler ultrasound of lower  Hypokalemia: Acute. Initial potassium noted to be 3.3 on admission. -Give potassium chloride 20 mEq p.o. -Continue to monitor and replace as needed  Essential hypertension: Home blood pressure medications include losartan 25 mg daily, flecainide 75 mg twice daily, and metoprolol XL 25 mg nightly.  -Continue home regimen as tolerated  History of paroxysmal SVT: Patient appears to be in sinus rhythm at this time. -Continue current regimen -Follow-up telemetry overnight  Peripheral vascular disease: Patient with prior history of popliteal embolus requiring embolectomy in 01/2020 by Dr. Arbie Cookey.  At that time patient had been seen by cardiology and recommended to be on continuous  anticoagulation.  However, it appears Eliquis had fallen off the patient's medication list.  Patient was noted to have an increased fall risk with history of aneurysms. -Continue Plavix  Anxiety -Continue current home regimen which includesProzac 20 mg twice daily and Xanax 3 times daily as needed.  Chronic kidney disease stage IIIa: On admission creatinine 1.13 with BUN 19. Creatinine appears relatively around patient's baseline 0.9-1.1. -Continue to monitor  History of aneurysm and CVA: CTA of the head and neck did note that the aneurysm of the right MCA was slightly increased from 2.5 mm in 2019 up to 3 mm on imaging, but stent was still in place. -Continue Plavix    Sigmoid diverticulosis: Patient with notes of severe sigmoid diverticulosis without signs of diverticulitis on CT imaging.  Tobacco abuse: Patient still reports smoking 4 to 5 cigarettes/day on average.  Declines need of nicotine patch -Counseled on the need of cessation of tobacco  DVT prophylaxis: Lovenox Code Status: Full Family Communication: Daughter updated over the phone Disposition Plan: Hopefully discharge home in 2 to 3 days Consults called: Cardiology Admission status: inpatient, require more than 2 midnight stay  Clydie Braun MD [facility]   If 7PM-7AM, please contact night-coverage   09/18/2020, 9:12 AM

## 2020-09-18 NOTE — Progress Notes (Signed)
Chart reviewed as part of COVID-19 treatment team. As patient is presently admitted, would not qualify for outpatient treatment and will defer treatment to inpatient providers.   Alver Sorrow, NP

## 2020-09-19 DIAGNOSIS — G459 Transient cerebral ischemic attack, unspecified: Secondary | ICD-10-CM

## 2020-09-19 LAB — COMPREHENSIVE METABOLIC PANEL
ALT: 17 U/L (ref 0–44)
AST: 31 U/L (ref 15–41)
Albumin: 2.5 g/dL — ABNORMAL LOW (ref 3.5–5.0)
Alkaline Phosphatase: 65 U/L (ref 38–126)
Anion gap: 10 (ref 5–15)
BUN: 10 mg/dL (ref 8–23)
CO2: 25 mmol/L (ref 22–32)
Calcium: 8 mg/dL — ABNORMAL LOW (ref 8.9–10.3)
Chloride: 102 mmol/L (ref 98–111)
Creatinine, Ser: 0.94 mg/dL (ref 0.44–1.00)
GFR, Estimated: >60 mL/min (ref >60)
Glucose, Bld: 94 mg/dL (ref 70–99)
Potassium: 2.9 mmol/L — ABNORMAL LOW (ref 3.5–5.1)
Sodium: 137 mmol/L (ref 135–145)
Total Bilirubin: 0.5 mg/dL (ref 0.3–1.2)
Total Protein: 5.8 g/dL — ABNORMAL LOW (ref 6.5–8.1)

## 2020-09-19 LAB — FERRITIN: Ferritin: 249 ng/mL (ref 11–307)

## 2020-09-19 LAB — CBC WITH DIFFERENTIAL/PLATELET
Abs Immature Granulocytes: 0.01 10*3/uL (ref 0.00–0.07)
Basophils Absolute: 0 10*3/uL (ref 0.0–0.1)
Basophils Relative: 0 %
Eosinophils Absolute: 0.2 10*3/uL (ref 0.0–0.5)
Eosinophils Relative: 4 %
HCT: 31.9 % — ABNORMAL LOW (ref 36.0–46.0)
Hemoglobin: 10.7 g/dL — ABNORMAL LOW (ref 12.0–15.0)
Immature Granulocytes: 0 %
Lymphocytes Relative: 32 %
Lymphs Abs: 1.6 10*3/uL (ref 0.7–4.0)
MCH: 31 pg (ref 26.0–34.0)
MCHC: 33.5 g/dL (ref 30.0–36.0)
MCV: 92.5 fL (ref 80.0–100.0)
Monocytes Absolute: 0.3 10*3/uL (ref 0.1–1.0)
Monocytes Relative: 7 %
Neutro Abs: 3 10*3/uL (ref 1.7–7.7)
Neutrophils Relative %: 57 %
Platelets: 181 10*3/uL (ref 150–400)
RBC: 3.45 MIL/uL — ABNORMAL LOW (ref 3.87–5.11)
RDW: 14 % (ref 11.5–15.5)
WBC: 5.2 10*3/uL (ref 4.0–10.5)
nRBC: 0 % (ref 0.0–0.2)

## 2020-09-19 LAB — D-DIMER, QUANTITATIVE: D-Dimer, Quant: 0.83 ug/mL-FEU — ABNORMAL HIGH (ref 0.00–0.50)

## 2020-09-19 LAB — PHOSPHORUS: Phosphorus: 3.1 mg/dL (ref 2.5–4.6)

## 2020-09-19 LAB — MAGNESIUM: Magnesium: 1.1 mg/dL — ABNORMAL LOW (ref 1.7–2.4)

## 2020-09-19 LAB — C-REACTIVE PROTEIN: CRP: 1.8 mg/dL — ABNORMAL HIGH (ref <1.0)

## 2020-09-19 MED ORDER — POTASSIUM CHLORIDE CRYS ER 20 MEQ PO TBCR
40.0000 meq | EXTENDED_RELEASE_TABLET | Freq: Four times a day (QID) | ORAL | Status: AC
  Administered 2020-09-19 (×2): 40 meq via ORAL
  Filled 2020-09-19 (×2): qty 2

## 2020-09-19 MED ORDER — POTASSIUM CHLORIDE 10 MEQ/100ML IV SOLN
10.0000 meq | INTRAVENOUS | Status: AC
Start: 2020-09-19 — End: 2020-09-19
  Administered 2020-09-19 (×2): 10 meq via INTRAVENOUS
  Filled 2020-09-19 (×2): qty 100

## 2020-09-19 NOTE — Evaluation (Signed)
Occupational Therapy Evaluation Patient Details Name: Barbara Hill MRN: [id number] DOB: 24-Feb-1941 Today's Date: 09/19/2020    History of Present Illness Pt is a 80 y/o female admitted secondary to AMS and instability. Pt MRI negative for acute abnormality. Found to be COVID +. PMH includes COPD, stented R MCA aneurysm, HTN, and CVA.   Clinical Impression   PTA patient was living with family in a private residence and was independent with ADLs without AD. Patient was not driving. Patient currently limited by deficits including generalized weakness, decreased cognition, decreased balance, and increased need for external assistance with ADLs/ADL transfers and household mobility requiring Min guard to Min A overall. Patient would benefit from continued acute OT services in prep for safe d/c home with family and recommended HHOT.     Follow Up Recommendations  Home health OT;Supervision/Assistance - 24 hour    Equipment Recommendations  3 in 1 bedside commode    Recommendations for Other Services       Precautions / Restrictions Precautions Precautions: Fall Precaution Comments: Incontinent x2 Restrictions Weight Bearing Restrictions: No      Mobility Bed Mobility Overal bed mobility: Needs Assistance Bed Mobility: Sit to Supine       Sit to supine: Min guard   General bed mobility comments: Min guard with increased time/effort. HOB elevated.    Transfers Overall transfer level: Needs assistance Equipment used: 1 person hand held assist Transfers: Sit to/from [employer] Sit to Stand: Min assist;Min guard Stand pivot transfers: Min guard       General transfer comment: Min guard for sit to stand x2 with cues for hand placement. Min guard for SPT to recliner with HHA +1.    Balance Overall balance assessment: Needs assistance Sitting-balance support: No upper extremity supported;Feet supported Sitting balance-Leahy Scale: Good     Standing balance  support: No upper extremity supported;During functional activity Standing balance-Leahy Scale: Fair Standing balance comment: Able to maintain static standing balance during hygiene/clothing management without LOB.                           ADL either performed or assessed with clinical judgement   ADL Overall ADL's : Needs assistance/impaired     Grooming: Set up;Sitting   Upper Body Bathing: Set up;Sitting Upper Body Bathing Details (indicate cue type and reason): Able to wash BUE, chest and abdomen with set-up assist. Lower Body Bathing: Minimal assistance;Sit to/from stand Lower Body Bathing Details (indicate cue type and reason): Patient able to wash front perineal area and buttocks with Min guard to Min A to steady. Assist to wash bilateral feet. Upper Body Dressing : Set up;Sitting Upper Body Dressing Details (indicate cue type and reason): Donned anterior hospital gown Lower Body Dressing: Min guard;Minimal assistance;Sit to/from stand   Toilet Transfer: Min Emergency planning/management officer Details (indicate cue type and reason): Min guard for transfer to [facility]. Patient incontinent of bowel prior to reaching BSC. Toileting- Clothing Manipulation and Hygiene: Minimal assistance;Sit to/from stand Toileting - Clothing Manipulation Details (indicate cue type and reason): Min A for hygiene management.     Functional mobility during ADLs: Min guard;Minimal assistance General ADL Comments: +1 HHA for short-distance functional mobility to recliner.     Vision Baseline Vision/History: Wears glasses Wears Glasses: Reading only Patient Visual Report: No change from baseline Vision Assessment?: No apparent visual deficits     Perception     Praxis Praxis Praxis tested?: Within functional limits  Pertinent Vitals/Pain Pain Assessment: No/denies pain     Hand Dominance Right   Extremity/Trunk Assessment Upper Extremity Assessment Upper Extremity Assessment: Generalized  weakness   Lower Extremity Assessment Lower Extremity Assessment: Defer to PT evaluation   Cervical / Trunk Assessment Cervical / Trunk Assessment: Normal   Communication Communication Communication: No difficulties   Cognition Arousal/Alertness: Awake/alert Behavior During Therapy: WFL for tasks assessed/performed Overall Cognitive Status: History of cognitive impairments - at baseline Area of Impairment: Memory;Safety/judgement;Awareness                     Memory: Decreased short-term memory   Safety/Judgement: Decreased awareness of safety;Decreased awareness of deficits Awareness: Emergent   General Comments: Decreased STM. Patient able to follow 1-2 step verbal commands with good accuracy.   General Comments  Daughter present at bedside.    Exercises     Shoulder Instructions      Home Living Family/patient expects to be discharged to:: Private residence Living Arrangements: Children Available Help at Discharge: Family;Available 24 hours/day Type of Home: House Home Access: Stairs to enter [employer] of Steps: 2 Entrance Stairs-Rails: None Home Layout: One level     Bathroom Shower/Tub: Producer, television/film/video: Handicapped height     Home Equipment: Shower seat   Additional Comments: Pt initially reporting she lived with her husband, but then reports her husband died and she lives with her kids.  Lives With: Daughter;Family    Prior Functioning/Environment Level of Independence: Independent                 OT Problem List: Decreased strength;Impaired balance (sitting and/or standing);Decreased cognition;Decreased safety awareness;Decreased knowledge of use of DME or AE      OT Treatment/Interventions: Self-care/ADL training;Therapeutic exercise;Energy conservation;DME and/or AE instruction;Therapeutic activities;Cognitive remediation/compensation;Patient/family education;Balance training    OT Goals(Current goals  can be found in the care plan section) Acute Rehab OT Goals Patient Stated Goal: to go home OT Goal Formulation: With patient Time For Goal Achievement: 10/31/20 Potential to Achieve Goals: Good ADL Goals Pt Will Perform Grooming: with supervision;standing Pt Will Perform Upper Body Dressing: with set-up;sitting Pt Will Perform Lower Body Dressing: with supervision;sit to/from stand Pt Will Transfer to Toilet: with supervision;ambulating Pt Will Perform Toileting - Clothing Manipulation and hygiene: with supervision;sit to/from stand Additional ADL Goal #1: Patient will recall and utilize 3 energy conservation techniques in prep for ADLs.  OT Frequency: Min 2X/week   Barriers to D/C:            Co-evaluation              AM-PAC OT "6 Clicks" Daily Activity     Outcome Measure Help from another person eating meals?: None Help from another person taking care of personal grooming?: A Little Help from another person toileting, which includes using toliet, bedpan, or urinal?: A Little Help from another person bathing (including washing, rinsing, drying)?: A Little Help from another person to put on and taking off regular upper body clothing?: A Little Help from another person to put on and taking off regular lower body clothing?: A Little 6 Click Score: 19   End of Session Equipment Utilized During Treatment: Gait belt Nurse Communication: Mobility status  Activity Tolerance: Patient tolerated treatment well Patient left: in chair;with call bell/phone within reach;with chair alarm set;with nursing/sitter in room;with family/visitor present  OT Visit Diagnosis: Unsteadiness on feet (R26.81);Muscle weakness (generalized) (M62.81)  Time: 5621-3086 OT Time Calculation (min): 32 min Charges:  OT General Charges $OT Visit: 1 Visit OT Evaluation $OT Eval Moderate Complexity: 1 Mod OT Treatments $Self Care/Home Management : 8-22 mins  Caprice Wasko H.  OTR/L Supplemental OT, Department of rehab services [phone]  Idalis Hoelting R H. 09/19/2020, 3:03 PM

## 2020-09-19 NOTE — Plan of Care (Signed)
  Problem: Education: Goal: Knowledge of disease or condition will improve Outcome: Progressing Goal: Knowledge of secondary prevention will improve Outcome: Progressing Goal: Individualized Educational Video(s) Outcome: Progressing   Problem: Coping: Goal: Will verbalize positive feelings about self Outcome: Progressing   Problem: Health Behavior/Discharge Planning: Goal: Ability to manage health-related needs will improve Outcome: Progressing   

## 2020-09-19 NOTE — Progress Notes (Signed)
PROGRESS NOTE    Barbara Hill  ZOX:[id number] DOB: 12/08/1940 DOA: 09/17/2020 PCP: Richmond Campbell., PA-C   Brief Narrative:  Barbara Hill is a 80 y.o. 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  History is obtained from the patient as well as her daughter over the phone.  The patient was witnessed falling over the chair at home yesterday morning and daughter thought that the left side of her mother's face was drooping. Due to her history of popliteal occlusion she was concerned for the possibility of blood clot. She did not receive any COVID-19 vaccinations. Telemetry neurology have been consulted CT of the head showed no acute intercranial abnormality, but did note chronic right basal ganglia lacunar infarcts. Patient was not a candidate for TPA given anticoagulation history. CTA of the head and neck significant for stent of the right MCA with 3mm aneurysm slightly increased from the 2.5 seen on 12/2017 and a 1-2 mm anterior communicating artery complex may reflect small aneurysm. Admitted for ongoing evaluation for weakness/pain.  Assessment & Plan:   Principal Problem:   TIA (transient ischemic attack) Active Problems:   Paroxysmal SVT (supraventricular tachycardia) (HCC)   Tobacco use disorder   Hypokalemia   Brain aneurysm   Normocytic anemia   CKD (chronic kidney disease), stage III (HCC)  TIA, CVA ruled out given negative imaging Left arm and leg weakness appears more secondary to pain. Speech intact and no significant left-sided facial droop appreciated at this time MRI shows no acute pathology, no current indication for neurology involvement  Sepsis secondary to urinary tract infection with hematuria, POA Clinically symptomatic, continue IV antibiotics for 3 days  Patient noted to have fever, leukocytosis and  overt symptoms for UTI meeting sepsis criteria  COVID-19 positive -likely incidental:  - Patient is unvaccinated  - Low-grade fever 100.6 F on admission in the setting of above sepsis/UTI  - Maintained on room air. - No current treatment indicated; inflammatory markers not consistent with covid infection  Hypokalemia in the setting of increased bowel movement -Patient has chronic soft stool but denies any overt diarrhea -Replaced today- follow am labs  Left shoulder and left hip pain, chronic - BLE US unremarkable for DVT/SVT  Essential hypertension:  Continue home meds losartan 25 mg daily, flecainide 75 mg twice daily, and metoprolol XL 25 mg nightly.   Peripheral vascular disease:  - Patient with prior history of popliteal embolus requiring embolectomy in 01/2020 by Dr. Arbie Cookey - recommended to be on continuous anticoagulation.  However, it appears Eliquis had fallen off the patient's medication list.  Patient was noted to have an increased fall risk with history of aneurysms. -Continue Plavix  Chronic kidney disease stage IIIa:  At baseline  History of aneurysm and CVA: CTA of the head and neck did note that the aneurysm of the right MCA was slightly increased from 2.5 mm in 2019 up to 3 mm on imaging, but stent was still in place. -Continue Plavix   DVT prophylaxis: Lovenox Code Status: Full Family Communication: Patient to update  Status is: Inpt  Dispo: The patient is from: Home              Anticipated d/c is to: Home              Anticipated d/c date is: 52-48h  Patient currently NOT medically stable for discharge  Consultants:   None  Procedures:   None  Antimicrobials:  Ceftriaxone x3 days   Subjective: No acute issues or events overnight, denies shortness of breath, chest pain, nausea, vomiting, diarrhea, constipation.  Objective: Vitals:   09/18/20 2011 09/18/20 2121 09/18/20 2332 09/19/20 0300  BP: (!) 110/59 140/61 (!) 113/58  122/60  Pulse: 68 63 60 64  Resp: 16  16 16   Temp: 98.3 F (36.8 C)  98 F (36.7 C) 98.5 F (36.9 C)  TempSrc: Oral  Oral Oral  SpO2: 100%  99% 99%  Weight:      Height:        Intake/Output Summary (Last 24 hours) at 09/19/2020 0738 Last data filed at 09/19/2020 0441 Gross per 24 hour  Intake 100 ml  Output -  Net 100 ml   Filed Weights   09/17/20 1534 09/17/20 1616 09/18/20 0410  Weight: 45.4 kg 42.5 kg 44.4 kg    Examination:  General exam: Appears calm and comfortable  Respiratory system: Clear to auscultation. Respiratory effort normal. Cardiovascular system: S1 & S2 heard, RRR. No JVD, murmurs, rubs, gallops or clicks. No pedal edema. Gastrointestinal system: Abdomen is nondistended, soft and nontender. No organomegaly or masses felt. Normal bowel sounds heard. Central nervous system: Alert and oriented. No focal neurological deficits. Extremities: Left upper extremity range of motion limited by pain otherwise no deficits Skin: No rashes, lesions or ulcers  Data Reviewed: I have personally reviewed following labs and imaging studies  CBC: Recent Labs  Lab 09/17/20 1324 09/17/20 1817 09/18/20 1139 09/19/20 0411  WBC 4.4 5.6 5.4 5.2  NEUTROABS  --  3.2  --  3.0  HGB 10.6* 11.2* 11.1* 10.7*  HCT 32.6* 33.9* 33.9* 31.9*  MCV 92.9 91.4 92.6 92.5  PLT 206 206 183 181   Basic Metabolic Panel: Recent Labs  Lab 09/17/20 1324 09/17/20 1817 09/18/20 1139 09/19/20 0411  NA 138 136 136 137  K 3.4* 3.3* 3.1* 2.9*  CL 104 101 101 102  CO2 23 26 26 25   GLUCOSE 92 100* 88 94  BUN 17 19 13 10   CREATININE 1.09* 1.13* 0.99 0.94  CALCIUM 8.8* 8.8* 7.9* 8.0*  MG  --   --  1.3* 1.1*  PHOS  --   --   --  3.1   GFR: Estimated Creatinine Clearance: 34.6 mL/min (by C-G formula based on SCr of 0.94 mg/dL). Liver Function Tests: Recent Labs  Lab 09/17/20 1324 09/17/20 1817 09/19/20 0411  AST 41 43* 31  ALT 19 20 17   ALKPHOS 84 82 65  BILITOT 0.2* 0.3 0.5  PROT  6.9 7.2 5.8*  ALBUMIN 3.3* 3.4* 2.5*   No results for input(s): LIPASE, AMYLASE in the last 168 hours. No results for input(s): AMMONIA in the last 168 hours. Coagulation Profile: No results for input(s): INR, PROTIME in the last 168 hours. Cardiac Enzymes: No results for input(s): CKTOTAL, CKMB, CKMBINDEX, TROPONINI in the last 168 hours. BNP (last 3 results) No results for input(s): PROBNP in the last 8760 hours. HbA1C: No results for input(s): HGBA1C in the last 72 hours. CBG: No results for input(s): GLUCAP in the last 168 hours. Lipid Profile: No results for input(s): CHOL, HDL, LDLCALC, TRIG, CHOLHDL, LDLDIRECT in the last 72 hours. Thyroid Function Tests: No results for input(s): TSH, T4TOTAL, FREET4, T3FREE, THYROIDAB in the last 72 hours. Anemia Panel: Recent Labs    09/18/20 1527 09/19/20 0411  FERRITIN 251 249  Sepsis Labs: Recent Labs  Lab 09/18/20 1527  PROCALCITON <0.10    Recent Results (from the past 240 hour(s))  Urine Culture     Status: Abnormal (Preliminary result)   Collection Time: 09/17/20  6:17 PM   Specimen: Urine, Random  Result Value Ref Range Status   Specimen Description   Final    URINE, RANDOM Performed at [facility], [address] [place], [place] [address]    Special Requests   Final    NONE Performed at [facility], [address] [place], [place] [address]    Culture >=100,000 COLONIES/mL GRAM NEGATIVE RODS (A)  Final   Report Status PENDING  Incomplete  SARS Coronavirus 2 by RT PCR (hospital order, performed in [facility] hospital lab) Nasopharyngeal Nasopharyngeal Swab     Status: Abnormal   Collection Time: 09/17/20  8:44 PM   Specimen: Nasopharyngeal Swab  Result Value Ref Range Status   SARS Coronavirus 2 POSITIVE (A) NEGATIVE Final    Comment: RESULT CALLED TO, READ BACK BY AND VERIFIED WITH: KELLIE NEAL RN ON 09/17/20 AT [address] [facility] (NOTE) SARS-CoV-2 target nucleic acids are  DETECTED  SARS-CoV-2 RNA is generally detectable in upper respiratory specimens  during the acute phase of infection.  Positive results are indicative  of the presence of the identified virus, but do not rule out bacterial infection or co-infection with other pathogens not detected by the test.  Clinical correlation with patient history and  other diagnostic information is necessary to determine patient infection status.  The expected result is negative.  Fact Sheet for Patients:   BoilerBrush.com.cy   Fact Sheet for Healthcare Providers:   https://pope.com/    This test is not yet approved or cleared by the [place] FDA and  has been authorized for detection and/or diagnosis of SARS-CoV-2 by FDA under an Emergency Use Authorization (EUA).  This EUA will remain in effect (meaning this  test can be used) for the duration of  the COVID-19 declaration under Section 564(b)(1) of the Act, 21 U.S.C. section 360-bbb-3(b)(1), unless the authorization is terminated or revoked sooner.  Performed at [facility], [address], [place], [place] [address]          Radiology Studies: CT Angio Head W or Wo Contrast  Result Date: 09/17/2020 CLINICAL DATA:  Neuro deficit, acute, stroke suspected. Weakness, dizziness, facial droop this morning. History of brain aneurysm. EXAM: CT ANGIOGRAPHY HEAD AND NECK TECHNIQUE: Multidetector CT imaging of the head and neck was performed using the standard protocol during bolus administration of intravenous contrast. Multiplanar CT image reconstructions and MIPs were obtained to evaluate the vascular anatomy. Carotid stenosis measurements (when applicable) are obtained utilizing NASCET criteria, using the distal internal carotid diameter as the denominator. CONTRAST:  76mL OMNIPAQUE IOHEXOL 350 MG/ML SOLN COMPARISON:  Noncontrast head CT performed earlier today 09/17/2020. CT angiogram FINDINGS:  CTA NECK FINDINGS Aortic arch: Atherosclerotic plaque within the visualized aortic arch and proximal major branch vessels of the neck. The origins of the innominate and left common carotid arteries are excluded from the field of view. Within this limitation, no hemodynamically significant innominate or proximal subclavian artery stenosis. Right carotid system: CCA and ICA patent within the neck without stenosis. No significant atherosclerotic disease. Left carotid system: The origin of the common carotid artery is not included in the field of view. Within this limitation, the CCA and ICA are patent within the neck without stenosis. No  significant atherosclerotic disease. Vertebral arteries: Codominant and patent within the neck without stenosis. Minimal calcified plaque within the V1 right vertebral artery. Skeleton: No acute bony abnormality or aggressive osseous lesion. Trace C2-C3 grade 1 retrolisthesis. 2 mm C4-C5 grade 1 anterolisthesis. Trace C6-C7 grade 1 retrolisthesis. Cervical spondylosis. Most notably, there is advanced C5-C6 disc space narrowing. Other neck: No neck mass or cervical lymphadenopathy. Subcentimeter right thyroid lobe nodule not meeting consensus criteria for ultrasound follow-up Upper chest: No consolidation within the imaged lung apices. Centrilobular emphysema. Review of the MIP images confirms the above findings CTA HEAD FINDINGS Anterior circulation: The intracranial internal carotid arteries are patent. Nonstenotic calcified plaque within both vessels. Redemonstrated flow diverter device within the M1 right MCA. Stent artifact size limits evaluation for in stent stenosis. However, there is flow proximal and distal to the flow diverter consistent with stent patency. A 3 mm aneurysm at site of the flow diverter has slightly increased in size as compared to the CTA of 01/07/2018 (previously 2.5 mm) (series 7, image 248). The M1 left middle cerebral artery is patent. No M2 proximal branch  occlusion or high-grade proximal stenosis is identified. The anterior cerebral arteries are patent. 1-2 mm vascular protrusion projecting anteriorly from the anterior communicating artery complex (series 7, image 245) (series 9, image 110). In retrospect, this finding was present on the prior CT a of 01/07/2018 Posterior circulation: The intracranial vertebral arteries are patent. The basilar artery is patent. The posterior cerebral arteries are patent. A right posterior communicating artery is present. The left posterior communicating artery is hypoplastic or absent. Venous sinuses: Within the limitations of contrast timing, no convincing thrombus. Anatomic variants: As described Review of the MIP images confirms the above findings IMPRESSION: CTA neck: The origins of the innominate and left common carotid arteries are excluded from the field of view. Within this limitation, the common carotid, internal carotid and vertebral arteries are patent within the neck without hemodynamically significant stenosis. CTA head: 1. Redemonstrated flow diverter device within the M1 right middle cerebral artery. A 3 mm aneurysm at this site has slightly increased in size as compared to the CTA head of 01/07/2018 (previously 2.5 mm) 2. Stent artifact limits evaluation for right MCA in-stent stenosis. However, flow is present proximal and distal to the stent consistent with stent patency. 3. Elsewhere, no intracranial large vessel occlusion or proximal high-grade arterial stenosis is identified. 4. 1-2 mm anteriorly projecting vascular protrusion arising from the anterior communicating artery complex. This may reflect an additional small aneurysm or the origin of an otherwise poorly delineated small-vessel. In retrospect, this finding was present on the prior CTA. Electronically Signed   By: Jackey LogeKyle  Golden DO   On: 09/17/2020 20:19   CT Head Wo Contrast  Result Date: 09/17/2020 CLINICAL DATA:  Transient ischemic attack (TIA).  Additional history provided: Weakness, dizziness, facial droop this morning. EXAM: CT HEAD WITHOUT CONTRAST TECHNIQUE: Contiguous axial images were obtained from the base of the skull through the vertex without intravenous contrast. COMPARISON:  Brain MRI 07/01/2016.  CT a head/neck 01/07/2018. FINDINGS: Brain: Mild cerebral and cerebellar atrophy. Redemonstrated chronic lacunar infarct within right caudate nucleus/internal capsule genu. Additional the infarct within the right lentiform nucleus. Background mild ill-defined hypoattenuation within the cerebral white matter is nonspecific but compatible with chronic small vessel ischemic disease. There is no acute intracranial hemorrhage. No demarcated cortical infarct. No extra-axial fluid collection. No evidence of intracranial mass. No midline shift. Vascular: No hyperdense vessel. Redemonstrated flow diverter device in  the region of the proximal right middle cerebral artery. Skull: Normal. Negative for fracture or focal lesion. Sinuses/Orbits: Visualized orbits show no acute finding. No significant paranasal sinus disease at the imaged levels. IMPRESSION: No evidence of acute intracranial abnormality. Redemonstrated chronic right basal ganglia lacunar infarcts. Background mild generalized atrophy of the brain and mild chronic small vessel ischemic disease. Redemonstrated flow diverter device in the region of the proximal right middle cerebral artery. Electronically Signed   By: Jackey Loge DO   On: 09/17/2020 16:26   CT Angio Neck W and/or Wo Contrast  Result Date: 09/17/2020 CLINICAL DATA:  Neuro deficit, acute, stroke suspected. Weakness, dizziness, facial droop this morning. History of brain aneurysm. EXAM: CT ANGIOGRAPHY HEAD AND NECK TECHNIQUE: Multidetector CT imaging of the head and neck was performed using the standard protocol during bolus administration of intravenous contrast. Multiplanar CT image reconstructions and MIPs were obtained to evaluate  the vascular anatomy. Carotid stenosis measurements (when applicable) are obtained utilizing NASCET criteria, using the distal internal carotid diameter as the denominator. CONTRAST:  110mL OMNIPAQUE IOHEXOL 350 MG/ML SOLN COMPARISON:  Noncontrast head CT performed earlier today 09/17/2020. CT angiogram FINDINGS: CTA NECK FINDINGS Aortic arch: Atherosclerotic plaque within the visualized aortic arch and proximal major branch vessels of the neck. The origins of the innominate and left common carotid arteries are excluded from the field of view. Within this limitation, no hemodynamically significant innominate or proximal subclavian artery stenosis. Right carotid system: CCA and ICA patent within the neck without stenosis. No significant atherosclerotic disease. Left carotid system: The origin of the common carotid artery is not included in the field of view. Within this limitation, the CCA and ICA are patent within the neck without stenosis. No significant atherosclerotic disease. Vertebral arteries: Codominant and patent within the neck without stenosis. Minimal calcified plaque within the V1 right vertebral artery. Skeleton: No acute bony abnormality or aggressive osseous lesion. Trace C2-C3 grade 1 retrolisthesis. 2 mm C4-C5 grade 1 anterolisthesis. Trace C6-C7 grade 1 retrolisthesis. Cervical spondylosis. Most notably, there is advanced C5-C6 disc space narrowing. Other neck: No neck mass or cervical lymphadenopathy. Subcentimeter right thyroid lobe nodule not meeting consensus criteria for ultrasound follow-up Upper chest: No consolidation within the imaged lung apices. Centrilobular emphysema. Review of the MIP images confirms the above findings CTA HEAD FINDINGS Anterior circulation: The intracranial internal carotid arteries are patent. Nonstenotic calcified plaque within both vessels. Redemonstrated flow diverter device within the M1 right MCA. Stent artifact size limits evaluation for in stent stenosis.  However, there is flow proximal and distal to the flow diverter consistent with stent patency. A 3 mm aneurysm at site of the flow diverter has slightly increased in size as compared to the CTA of 01/07/2018 (previously 2.5 mm) (series 7, image 248). The M1 left middle cerebral artery is patent. No M2 proximal branch occlusion or high-grade proximal stenosis is identified. The anterior cerebral arteries are patent. 1-2 mm vascular protrusion projecting anteriorly from the anterior communicating artery complex (series 7, image 245) (series 9, image 110). In retrospect, this finding was present on the prior CT a of 01/07/2018 Posterior circulation: The intracranial vertebral arteries are patent. The basilar artery is patent. The posterior cerebral arteries are patent. A right posterior communicating artery is present. The left posterior communicating artery is hypoplastic or absent. Venous sinuses: Within the limitations of contrast timing, no convincing thrombus. Anatomic variants: As described Review of the MIP images confirms the above findings IMPRESSION: CTA neck: The origins of  the innominate and left common carotid arteries are excluded from the field of view. Within this limitation, the common carotid, internal carotid and vertebral arteries are patent within the neck without hemodynamically significant stenosis. CTA head: 1. Redemonstrated flow diverter device within the M1 right middle cerebral artery. A 3 mm aneurysm at this site has slightly increased in size as compared to the CTA head of 01/07/2018 (previously 2.5 mm) 2. Stent artifact limits evaluation for right MCA in-stent stenosis. However, flow is present proximal and distal to the stent consistent with stent patency. 3. Elsewhere, no intracranial large vessel occlusion or proximal high-grade arterial stenosis is identified. 4. 1-2 mm anteriorly projecting vascular protrusion arising from the anterior communicating artery complex. This may reflect  an additional small aneurysm or the origin of an otherwise poorly delineated small-vessel. In retrospect, this finding was present on the prior CTA. Electronically Signed   By: Jackey Loge DO   On: 09/17/2020 20:19   MR BRAIN WO CONTRAST  Result Date: 09/18/2020 CLINICAL DATA:  Stroke follow-up.  Imbalance and facial droop. EXAM: MRI HEAD WITHOUT CONTRAST TECHNIQUE: Multiplanar, multiecho pulse sequences of the brain and surrounding structures were obtained without intravenous contrast. COMPARISON:  Head CT 09/17/2020 and MRI 07/01/2016 FINDINGS: Brain: No acute infarct, mass, midline shift, or extra-axial fluid collection is identified. A chronic microhemorrhage laterally in the right parietal lobe is unchanged from the prior MRI. Chronic lacunar infarcts in the right basal ganglia are new from the prior MRI. T2 hyperintensities in the cerebral white matter bilaterally have progressed from the prior MRI and are nonspecific but compatible with mild chronic small vessel ischemic disease. There is mild cerebral atrophy. Vascular: Major intracranial vascular flow voids are preserved. Susceptibility artifact from a right MCA flow diverter device, more fully evaluated on yesterday's CTA. Skull and upper cervical spine: Unremarkable bone marrow signal. Sinuses/Orbits: Right cataract extraction. Small left mastoid effusion. Clear paranasal sinuses. Other: None. IMPRESSION: 1. No acute intracranial abnormality. 2. Mild chronic small vessel ischemic disease. Electronically Signed   By: Sebastian Ache M.D.   On: 09/18/2020 13:44   CT ABDOMEN PELVIS W CONTRAST  Result Date: 09/17/2020 CLINICAL DATA:  80 year old female with abdominal pain. EXAM: CT ABDOMEN AND PELVIS WITH CONTRAST TECHNIQUE: Multidetector CT imaging of the abdomen and pelvis was performed using the standard protocol following bolus administration of intravenous contrast. CONTRAST:  61mL OMNIPAQUE IOHEXOL 350 MG/ML SOLN COMPARISON:  CT abdomen pelvis  dated 02/15/2015. FINDINGS: Lower chest: There is a 12 x 13 mm nodule at the right lung base. Further evaluation with dedicated chest CT is recommended. There are bibasilar linear atelectasis/scarring. No intra-abdominal free air or free fluid. Hepatobiliary: The liver is unremarkable. No intrahepatic biliary dilatation. Cholecystectomy. Pancreas: Unremarkable. No pancreatic ductal dilatation or surrounding inflammatory changes. Spleen: Normal in size without focal abnormality. Adrenals/Urinary Tract: The adrenal glands unremarkable. There is no hydronephrosis on either side. There is symmetric enhancement and excretion of contrast by both kidneys. Bilateral renal cysts measure up to 2 cm in the upper pole of the right kidney. Smaller bilateral hypodensities are not characterized. The urinary bladder is grossly unremarkable. Stomach/Bowel: There is severe sigmoid diverticulosis without active inflammatory changes. There is no bowel obstruction or active inflammation. Moderate stool noted in the rectal vault. Appendectomy. Vascular/Lymphatic: Moderate aortoiliac atherosclerotic disease. The IVC is unremarkable. No portal venous gas. There is no adenopathy. Reproductive: Hysterectomy. No adnexal masses. Other: None Musculoskeletal: Osteopenia with degenerative changes of the spine. No acute osseous pathology. IMPRESSION:  1. No acute intra-abdominal or pelvic pathology. 2. Severe sigmoid diverticulosis. No bowel obstruction. 3. A 12 x 13 mm right lung base nodule. Dedicated chest CT is recommended. 4. Aortic Atherosclerosis (ICD10-I70.0). Electronically Signed   By: Elgie Collard M.D.   On: 09/17/2020 19:34   DG CHEST PORT 1 VIEW  Result Date: 09/18/2020 CLINICAL DATA:  Cough EXAM: PORTABLE CHEST 1 VIEW COMPARISON:  January 28, 2020 FINDINGS: Lungs are hyperexpanded. There is no evident edema or airspace opacity. Heart is mildly enlarged with pulmonary vascularity normal. No adenopathy. No bone lesions. IMPRESSION:  Lungs hyperexpanded. No edema or airspace opacity. Stable cardiac prominence. Electronically Signed   By: Bretta Bang III M.D.   On: 09/18/2020 14:07   DG Hip Unilat With Pelvis 2-3 Views Left  Result Date: 09/17/2020 CLINICAL DATA:  Left hip injury. EXAM: DG HIP (WITH OR WITHOUT PELVIS) 2-3V LEFT COMPARISON:  CT 01/25/2020. FINDINGS: Degenerative changes lumbar spine and both hips. No acute bony or joint abnormality. Degenerative changes particularly prominent about the left hip. No evidence of fracture dislocation. Aortoiliac atherosclerotic vascular calcification. IMPRESSION: Degenerative changes lumbar spine and both hips. Degenerative changes particularly prominent about the left hip. No acute abnormality identified. Electronically Signed   By: Maisie Fus  Register   On: 09/17/2020 14:08   VAS [place] LOWER EXTREMITY VENOUS (DVT)  Result Date: 09/18/2020  Lower Venous DVT Study Indications: Pain.  Comparison Study: no prior Performing Technologist: Blanch Media RVS  Examination Guidelines: A complete evaluation includes B-mode imaging, spectral Doppler, color Doppler, and power Doppler as needed of all accessible portions of each vessel. Bilateral testing is considered an integral part of a complete examination. Limited examinations for reoccurring indications may be performed as noted. The reflux portion of the exam is performed with the patient in reverse Trendelenburg.  +---------+---------------+---------+-----------+----------+--------------+ RIGHT    CompressibilityPhasicitySpontaneityPropertiesThrombus Aging +---------+---------------+---------+-----------+----------+--------------+ CFV      Full           Yes      Yes                                 +---------+---------------+---------+-----------+----------+--------------+ SFJ      Full                                                        +---------+---------------+---------+-----------+----------+--------------+ FV Prox   Full                                                        +---------+---------------+---------+-----------+----------+--------------+ FV Mid   Full                                                        +---------+---------------+---------+-----------+----------+--------------+ FV DistalFull                                                        +---------+---------------+---------+-----------+----------+--------------+  PFV      Full                                                        +---------+---------------+---------+-----------+----------+--------------+ POP      Full           Yes      Yes                                 +---------+---------------+---------+-----------+----------+--------------+ PTV      Full                                                        +---------+---------------+---------+-----------+----------+--------------+ PERO     Full                                                        +---------+---------------+---------+-----------+----------+--------------+   +---------+---------------+---------+-----------+----------+-------------------+ LEFT     CompressibilityPhasicitySpontaneityPropertiesThrombus Aging      +---------+---------------+---------+-----------+----------+-------------------+ CFV      Full           Yes      Yes                                      +---------+---------------+---------+-----------+----------+-------------------+ SFJ      Full                                                             +---------+---------------+---------+-----------+----------+-------------------+ FV Prox  Full                                                             +---------+---------------+---------+-----------+----------+-------------------+ FV Mid   Full                                                             +---------+---------------+---------+-----------+----------+-------------------+  FV DistalFull                                                             +---------+---------------+---------+-----------+----------+-------------------+ PFV      Full                                                             +---------+---------------+---------+-----------+----------+-------------------+  POP      Full           Yes      Yes                                      +---------+---------------+---------+-----------+----------+-------------------+ PTV      Full                                                             +---------+---------------+---------+-----------+----------+-------------------+ PERO                                                  Not well visualized +---------+---------------+---------+-----------+----------+-------------------+     Summary: BILATERAL: - No evidence of deep vein thrombosis seen in the lower extremities, bilaterally. - No evidence of superficial venous thrombosis in the lower extremities, bilaterally. -No evidence of popliteal cyst, bilaterally.   *See table(s) above for measurements and observations. Electronically signed by Coral Else MD on 09/18/2020 at 9:37:12 PM.    Final         Scheduled Meds: . vitamin C  500 mg Oral Daily  . clopidogrel  75 mg Oral Daily  . diclofenac Sodium  2 g Topical BID  . enoxaparin (LOVENOX) injection  30 mg Subcutaneous Q24H  . flecainide  75 mg Oral BID  . FLUoxetine  20 mg Oral BID  . losartan  25 mg Oral Daily  . metoprolol succinate  25 mg Oral QHS  . mirtazapine  7.5 mg Oral QHS  . pantoprazole  40 mg Oral Daily  . potassium chloride  40 mEq Oral BID  . zinc sulfate  220 mg Oral Daily   Continuous Infusions: . cefTRIAXone (ROCEPHIN)  IV 1 g (09/18/20 2116)  . potassium chloride       LOS: 1 day    Time spent:  Azucena Fallen, DO [facility]  If 7PM-7AM, please contact night-coverage [email or web address]  09/19/2020, 7:38 AM

## 2020-09-19 NOTE — Evaluation (Signed)
Speech Language Pathology Evaluation Patient Details Name: Barbara Hill MRN: [id number] DOB: [birth year] Today's Date: 09/19/2020 Time: 1330-1405 SLP Time Calculation (min) (ACUTE ONLY): 35 min  Problem List:  Patient Active Problem List   Diagnosis Date Noted  . TIA (transient ischemic attack) 09/17/2020  . Protein-calorie malnutrition, severe 02/05/2020  . Ischemic leg 01/25/2020  . COPD (chronic obstructive pulmonary disease) (HCC) 01/25/2020  . Normocytic anemia 01/25/2020  . History of CVA (cerebrovascular accident) 01/25/2020  . Anxiety 01/25/2020  . CKD (chronic kidney disease), stage III (HCC) 01/25/2020  . 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 Brain aneurysm 06/29/2016  . Lumbar contusion   . Orthostatic hypotension   . First degree AV block   . Hypokalemia   . Syncope 04/10/2016  . Paroxysmal SVT (supraventricular tachycardia) (HCC) 04/21/2011  . 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 Tobacco use disorder 04/21/2011   Past Medical History:  Past Medical History:  Diagnosis Date  . Anxiety   . Brain aneurysm    pipeline stent 2017   . COPD (chronic obstructive pulmonary disease) (HCC)   . Fatigue   . H/O: hysterectomy   . Headache(784.0)   . History of cardiac catheterization    LHC 10/02: Normal coronary arteries  . History of nuclear stress test    Myoview 10/12: Normal stress nuclear study.  . Hypertension   . Mitral valve prolapse    Echo 8/17:Mild LVH, EF 60-65, normal wall motion, grade 2 diastolic dysfunction, mild AI, mild MR, mild LAE // echo 11/01: EF 60%, mild anterior MVP with trivial MR, PASP 35-40 mmHg, LAE  . Orthostasis   . PSVT (paroxysmal supraventricular tachycardia) (HCC)    palps  . Radiculopathy    Past Surgical History:  Past Surgical History:  Procedure Laterality Date  . ABDOMINAL HYSTERECTOMY    . APPENDECTOMY    . BUBBLE STUDY  01/29/2020   Procedure: BUBBLE STUDY;  Surgeon: Parke Poisson, MD;  Location: [facility]  ENDOSCOPY;  Service: Cardiology;;  . CARDIAC CATHETERIZATION  12/95  . CATARACT EXTRACTION    . CHOLECYSTECTOMY    . EMBOLECTOMY Bilateral 01/25/2020   Procedure: Bilateral popliteal EMBOLECTOMY.;  Surgeon: [name], MD;  Location: [facility] OR;  Service: Vascular;  Laterality: Bilateral;  . FASCIOTOMY Bilateral 01/26/2020   Procedure: FASCIOTOMY;  Surgeon: [name], MD;  Location: [facility] OR;  Service: Vascular;  Laterality: Bilateral;  . IR GENERIC HISTORICAL  06/02/2016   IR RADIOLOGIST EVAL & MGMT 06/02/2016 [facility]-INTERV RAD  . IR GENERIC HISTORICAL  06/29/2016   IR ANGIO INTRA EXTRACRAN SEL INTERNAL CAROTID UNI R MOD SED 06/29/2016 [name], MD [facility]  . IR GENERIC HISTORICAL  06/29/2016   IR ANGIOGRAM FOLLOW UP STUDY 06/29/2016 [name], MD [facility]  . IR GENERIC HISTORICAL  06/29/2016   IR NEURO EACH ADD'L AFTER BASIC UNI RIGHT (MS) 06/29/2016 [name], MD [facility]  . IR GENERIC HISTORICAL  06/29/2016   IR 3D INDEPENDENT WKST 06/29/2016 [name], MD [facility]  . IR GENERIC HISTORICAL  06/29/2016   IR TRANSCATH/EMBOLIZ 06/29/2016 [name], MD [facility]  . IR GENERIC HISTORICAL  06/29/2016   IR ANGIO VERTEBRAL SEL SUBCLAVIAN INNOMINATE UNI R MOD SED 06/29/2016 [name], MD [facility]  . IR GENERIC HISTORICAL  07/28/2016   IR RADIOLOGIST EVAL & MGMT 07/28/2016 [facility]  . IR GENERIC HISTORICAL  11/12/2016   IR ANGIO INTRA EXTRACRAN SEL COM CAROTID INNOMINATE BILAT MOD SED 11/12/2016 [name]  [name], MD [facility]  . IR GENERIC HISTORICAL  11/12/2016   IR ANGIO VERTEBRAL SEL VERTEBRAL UNI L MOD SED 11/12/2016 [name], MD [facility]  . IR GENERIC HISTORICAL  11/12/2016   IR ANGIO VERTEBRAL SEL SUBCLAVIAN INNOMINATE UNI R MOD SED 11/12/2016 [name], MD [facility]  . NASAL SEPTUM SURGERY    . RADIOLOGY WITH ANESTHESIA N/A 06/29/2016   Procedure: EMBOLIZATION;  Surgeon: [name], MD;   Location: [facility] OR;  Service: Radiology;  Laterality: N/A;  . ROTATOR CUFF REPAIR    . TEE WITHOUT CARDIOVERSION N/A 01/29/2020   Procedure: TRANSESOPHAGEAL ECHOCARDIOGRAM (TEE);  Surgeon: Parke Poisson, MD;  Location: [facility] ENDOSCOPY;  Service: Cardiology;  Laterality: N/A;   HPI:  80yo female admitted 09/17/20 with left shoulder and hip pain after falling over a chair at home. PMH: PSVT, s/p stent right MCA aneurysm (2017) complicated by CVA, PVD s/p embolectomy. MRI negative for acute abnormality   Assessment / Plan / Recommendation Clinical Impression  Pt's daughter was present during this assessment, and indicates pt has a history of intermittent confusion prior to admit. Pt reports she is independent with paying bills, balancing checkbook, and light meal prep. Pt was not driving prior to admit. The [place] [employer] Mental Status (SLUMS) Examination was administered. Pt scored 20/30 (n=27+/30), raising concern for neurocognitive impairment. Points were lost on orientation to day of the week, mental math, thought organization (naming category members), digit reversal, clock drawing, following directions (circle, square, triangle), and auditory attention/paragraph info recall). 24 hour supervision is recommended, with assistance given during functional tasks (money management, med administration). Home health ST is recommended upon return to normal routines to facilitate establishment of routines to maximize safety and independence and decrease caregiver burden.    SLP Assessment  SLP Recommendation/Assessment: All further Speech Language Pathology needs can be addressed in the next venue of care  SLP Visit Diagnosis: Cognitive communication deficit (R41.841)    Follow Up Recommendations  24 hour supervision/assistance;Home health SLP       SLP Evaluation Cognition  Overall Cognitive Status: History of cognitive impairments - at baseline Arousal/Alertness: Awake/alert Orientation Level:  Oriented X4 (unaware of day of the week) Attention: Focused;Sustained;Selective Focused Attention: Appears intact Sustained Attention: Impaired Sustained Attention Impairment: Verbal basic Selective Attention: Impaired Selective Attention Impairment: Verbal basic Memory: Appears intact       Comprehension  Auditory Comprehension Overall Auditory Comprehension: Appears within functional limits for tasks assessed    Expression Expression Primary Mode of Expression: Verbal Verbal Expression Overall Verbal Expression: Appears within functional limits for tasks assessed Written Expression Dominant Hand: Right   Oral / Motor  Oral Motor/Sensory Function Overall Oral Motor/Sensory Function: Within functional limits Motor Speech Overall Motor Speech: Appears within functional limits for tasks assessed Intelligibility: Intelligible   GO                   Christine Morton B. Murvin Natal, [facility], CCC-SLP Speech Language Pathologist Office: [phone] Pager: [phone]  Leigh Aurora 09/19/2020, 2:18 PM

## 2020-09-19 NOTE — Consult Note (Signed)
[facility] TeleNeurology Consult Services  Stat Consult  Date of Service:   09/17/2020 20:51:39  Diagnosis:     .  R27.0 - Ataxia (unsp)  Impression: Possible stroke - NIHSS 0 but patient still c/o ataxia  -Admit for further work-up  -Continue Eliquis and Plavix  -MRI brain  -PT eval  -Inpatient Neuro f/up  CT HEAD: Showed No Acute Hemorrhage or Acute Core Infarct  Our recommendations are outlined below.  Diagnostic Studies: Recommend MRI brain without contrast  Laboratory Studies: Recommend Lipid panel Hemoglobin A1c  Medication: Statins for LDL goal less than 70 Permissive hypertension, Antihypertensives with prn for first 24-48 hrs post stroke onset. If BP greater than 220/120 give Labetalol IV or Vasotec IV  Nursing Recommendations: Telemetry, IV Fluids, avoid dextrose containing fluids, Maintain euglycemia Neuro checks q4 hrs x 24 hrs and then per shift Head of bed 30 degrees  Consultations: Recommend Speech therapy if failed dysphagia screen Physical therapy/Occupational therapy  Disposition: Neurology will follow  Additional Recommendations:    Imaging: CTA head and neck - no LVO or significant stenosis  Labs: COVID +  Metrics: [facility] Notification Time: 09/17/2020 20:49:07 Stamp Time: 09/17/2020 20:51:39 Callback Response Time: 09/17/2020 20:53:24   ----------------------------------------------------------------------------------------------------  Chief Complaint: Balance issues  History of Present Illness: Patient is a 80 year old Female.  Patient is a 80 years old woman who presented to the ED c/o balance issues since waking up this morning. LKW at 20:00 last night. Patient's daughter reports she hasn't been unable to walk due to leaning to the left. She denies any other associated symptoms as difficulty speaking or dizziness.   Past Medical History:     . Hypertension     . Covid-19     . Brain aneurysm, s/p  stent,  Anticoagulant use:  Yes Eliquis  Antiplatelet use: Yes Plavix 75 mg   Examination: BP(186/65), Pulse(61), Blood Glucose(pending) 1A: Level of Consciousness - Alert; keenly responsive + 0 1B: Ask Month and Age - Both Questions Right + 0 1C: Blink Eyes & Squeeze Hands - Performs Both Tasks + 0 2: Test Horizontal Extraocular Movements - Normal + 0 3: Test Visual Fields - No Visual Loss + 0 4: Test Facial Palsy (Use Grimace if Obtunded) - Normal symmetry + 0 5A: Test Left Arm Motor Drift - No Drift for 10 Seconds + 0 5B: Test Right Arm Motor Drift - No Drift for 10 Seconds + 0 6A: Test Left Leg Motor Drift - No Drift for 5 Seconds + 0 6B: Test Right Leg Motor Drift - No Drift for 5 Seconds + 0 7: Test Limb Ataxia (FNF/Heel-Shin) - No Ataxia + 0 8: Test Sensation - Normal; No sensory loss + 0 9: Test Language/Aphasia - Normal; No aphasia + 0 10: Test Dysarthria - Normal + 0 11: Test Extinction/Inattention - No abnormality + 0  NIHSS Score: 0   Patient / Family was informed the Neurology Consult would occur via TeleHealth consult by way of interactive audio and video telecommunications and consented to receiving care in this manner.  Patient is being evaluated for possible acute neurologic impairment and high probability of imminent or life - threatening deterioration.I spent total of 20 minutes providing care to this patient, including time for face to face visit via telemedicine, review of medical records, imaging studies and discussion of findings with providers, the patient and / or family.   Dr Vira Agar   [facility] [phone]  Case [id number]

## 2020-09-19 NOTE — Progress Notes (Signed)
Physical Therapy Treatment Patient Details Name: Barbara Hill MRN: [id number] DOB: [birth year] Today's Date: 09/19/2020    History of Present Illness Pt is a 80 y/o female admitted secondary to AMS and instability. Pt MRI negative for acute abnormality. Found to be COVID +. PMH includes COPD, stented R MCA aneurysm, HTN, and CVA.    PT Comments    Pt making progress, ambulating within room secondary COVID precautions. Pt progressed from R HHA to no UE support for balance with ambulation and was able to ambulate x4 bouts of ~150 ft > ~120 ft > ~60 ft > ~4ft. However, pt does display tendency to flex trunk anteriorly and laterally to R and demonstrate decreased L stance phase and thus decreased R step length with intermittent LOB/staggering to the R, requiring minA-min guard to recover balance. Pt is at risk for falls and would benefit from a rollator to provide her with stability and a place to rest when fatigued. Will continue to follow acutely. Current recommendations remain appropriate.  Follow Up Recommendations  Home health PT;Supervision/Assistance - 24 hour     Equipment Recommendations  Other (comment) (rollator)    Recommendations for Other Services       Precautions / Restrictions Precautions Precautions: Fall Precaution Comments: Incontinent x2; airborne and contact precautions Restrictions Weight Bearing Restrictions: No    Mobility  Bed Mobility               General bed mobility comments: Pt sitting up in recliner upon arrival.  Transfers Overall transfer level: Needs assistance Equipment used: 1 person hand held assist;None Transfers: Sit to/from Stand Sit to Stand: Min guard         General transfer comment: Min guard for sit to stand 4x from recliner, 1x to R HHA and other times to no UE support. No overt LOB but mild trunk sway.  Ambulation/Gait Ambulation/Gait assistance: Min guard;Min assist Gait Distance (Feet): 150 Feet (x4 bouts of ~150 ft >  ~120 ft > ~60 ft > ~90 ft, seated rest breaks between each) Assistive device: 1 person hand held assist;None Gait Pattern/deviations: Step-through pattern;Decreased step length - right;Decreased stance time - left;Decreased stride length;Staggering right;Trunk flexed Gait velocity: reduced Gait velocity interpretation: <1.8 ft/sec, indicate of risk for recurrent falls General Gait Details: Ambulates slowly, initially with R HHA but progressed to no UE support. Pt tends to have decreased stance time on L resulting in quick step with R and intermittent staggering to R, requiring min guard-minA to recover. Cued pt through tactile and VCs to extend trunk and flex laterally more to L in trunk as she tends to flex anteriorly and to R, momentary success.   Stairs             Wheelchair Mobility    Modified Rankin (Stroke Patients Only)       Balance Overall balance assessment: Needs assistance         Standing balance support: No upper extremity supported;During functional activity Standing balance-Leahy Scale: Fair Standing balance comment: No UE support with mobility but intermittent R lateral LOB resulting in minA-min guard to recover.                            Cognition Arousal/Alertness: Awake/alert Behavior During Therapy: WFL for tasks assessed/performed Overall Cognitive Status: Impaired/Different from baseline Area of Impairment: Memory;Safety/judgement;Awareness;Orientation                 Orientation Level: Disoriented  to;Place;Situation (knew she was in a hospital but not which one and initially stated she was here for an UTI, but then corrected it later)   Memory: Decreased short-term memory (repeating questions and comments often)   Safety/Judgement: Decreased awareness of safety;Decreased awareness of deficits Awareness: Emergent   General Comments: Decreased STM, repeating questions and comments. Initially did not know situation or exact  hospital location. Patient able to follow 1-2 step verbal commands with good accuracy.      Exercises      General Comments General comments (skin integrity, edema, etc.): SpO2 >/= 98% on RA      Pertinent Vitals/Pain Pain Assessment: No/denies pain    Home Living     Available Help at Discharge: Family;Available 24 hours/day Type of Home: House              Prior Function            PT Goals (current goals can now be found in the care plan section) Acute Rehab PT Goals Patient Stated Goal: to go home PT Goal Formulation: With patient Time For Goal Achievement: 10/03/20 Potential to Achieve Goals: Good Progress towards PT goals: Progressing toward goals    Frequency    Min 3X/week      PT Plan Current plan remains appropriate;Equipment recommendations need to be updated    Co-evaluation              AM-PAC PT "6 Clicks" Mobility   Outcome Measure  Help needed turning from your back to your side while in a flat bed without using bedrails?: A Little Help needed moving from lying on your back to sitting on the side of a flat bed without using bedrails?: A Little Help needed moving to and from a bed to a chair (including a wheelchair)?: A Little Help needed standing up from a chair using your arms (e.g., wheelchair or bedside chair)?: A Little Help needed to walk in hospital room?: A Little Help needed climbing 3-5 steps with a railing? : A Little 6 Click Score: 18    End of Session Equipment Utilized During Treatment: Gait belt Activity Tolerance: Patient tolerated treatment well Patient left: in chair;with call bell/phone within reach;with chair alarm set   PT Visit Diagnosis: Unsteadiness on feet (R26.81);Other abnormalities of gait and mobility (R26.89);Difficulty in walking, not elsewhere classified (R26.2)     Time: 9622-2979 PT Time Calculation (min) (ACUTE ONLY): 36 min  Charges:  $Gait Training: 23-37 mins                     Raymond Gurney, PT, DPT Acute Rehabilitation Services  Pager: [phone] Office: [phone]    Barbara Hill 09/19/2020, 5:15 PM

## 2020-09-20 ENCOUNTER — Other Ambulatory Visit (HOSPITAL_COMMUNITY): Payer: Self-pay | Admitting: Internal Medicine

## 2020-09-20 LAB — C-REACTIVE PROTEIN: CRP: 1.2 mg/dL — ABNORMAL HIGH (ref <1.0)

## 2020-09-20 LAB — COMPREHENSIVE METABOLIC PANEL
ALT: 14 U/L (ref 0–44)
AST: 24 U/L (ref 15–41)
Albumin: 2.6 g/dL — ABNORMAL LOW (ref 3.5–5.0)
Alkaline Phosphatase: 68 U/L (ref 38–126)
Anion gap: 10 (ref 5–15)
BUN: 7 mg/dL — ABNORMAL LOW (ref 8–23)
CO2: 24 mmol/L (ref 22–32)
Calcium: 8.2 mg/dL — ABNORMAL LOW (ref 8.9–10.3)
Chloride: 102 mmol/L (ref 98–111)
Creatinine, Ser: 0.77 mg/dL (ref 0.44–1.00)
GFR, Estimated: >60 mL/min (ref >60)
Glucose, Bld: 90 mg/dL (ref 70–99)
Potassium: 3.8 mmol/L (ref 3.5–5.1)
Sodium: 136 mmol/L (ref 135–145)
Total Bilirubin: 0.7 mg/dL (ref 0.3–1.2)
Total Protein: 5.8 g/dL — ABNORMAL LOW (ref 6.5–8.1)

## 2020-09-20 LAB — CBC WITH DIFFERENTIAL/PLATELET
Abs Immature Granulocytes: 0.02 10*3/uL (ref 0.00–0.07)
Basophils Absolute: 0 10*3/uL (ref 0.0–0.1)
Basophils Relative: 1 %
Eosinophils Absolute: 0.1 10*3/uL (ref 0.0–0.5)
Eosinophils Relative: 3 %
HCT: 32.4 % — ABNORMAL LOW (ref 36.0–46.0)
Hemoglobin: 10.5 g/dL — ABNORMAL LOW (ref 12.0–15.0)
Immature Granulocytes: 1 %
Lymphocytes Relative: 32 %
Lymphs Abs: 1.3 10*3/uL (ref 0.7–4.0)
MCH: 29.8 pg (ref 26.0–34.0)
MCHC: 32.4 g/dL (ref 30.0–36.0)
MCV: 92 fL (ref 80.0–100.0)
Monocytes Absolute: 0.3 10*3/uL (ref 0.1–1.0)
Monocytes Relative: 8 %
Neutro Abs: 2.4 10*3/uL (ref 1.7–7.7)
Neutrophils Relative %: 55 %
Platelets: 164 10*3/uL (ref 150–400)
RBC: 3.52 MIL/uL — ABNORMAL LOW (ref 3.87–5.11)
RDW: 14 % (ref 11.5–15.5)
WBC: 4.3 10*3/uL (ref 4.0–10.5)
nRBC: 0 % (ref 0.0–0.2)

## 2020-09-20 LAB — URINE CULTURE: Culture: >=100000 — AB

## 2020-09-20 LAB — PHOSPHORUS: Phosphorus: 2.7 mg/dL (ref 2.5–4.6)

## 2020-09-20 LAB — D-DIMER, QUANTITATIVE: D-Dimer, Quant: 0.52 ug/mL-FEU — ABNORMAL HIGH (ref 0.00–0.50)

## 2020-09-20 LAB — FERRITIN: Ferritin: 211 ng/mL (ref 11–307)

## 2020-09-20 LAB — MAGNESIUM: Magnesium: 1.1 mg/dL — ABNORMAL LOW (ref 1.7–2.4)

## 2020-09-20 MED ORDER — AMLODIPINE BESYLATE 5 MG PO TABS
5.0000 mg | ORAL_TABLET | Freq: Every day | ORAL | Status: DC
  Administered 2020-09-20: 5 mg via ORAL
  Filled 2020-09-20: qty 1

## 2020-09-20 MED ORDER — AMLODIPINE BESYLATE 5 MG PO TABS: 5.0000 mg | ORAL_TABLET | Freq: Every day | ORAL | 0 refills | Status: DC

## 2020-09-20 MED ORDER — AMLODIPINE BESYLATE 5 MG PO TABS: 5.0000 mg | ORAL_TABLET | Freq: Every day | ORAL | Status: DC

## 2020-09-20 MED ORDER — ENSURE ENLIVE PO LIQD: 237.0000 mL | Freq: Three times a day (TID) | ORAL | Status: DC

## 2020-09-20 MED ORDER — ADULT MULTIVITAMIN W/MINERALS CH
1.0000 | ORAL_TABLET | Freq: Every day | ORAL | Status: DC
  Administered 2020-09-20: 1 via ORAL
  Filled 2020-09-20: qty 1

## 2020-09-20 MED ORDER — HYDRALAZINE HCL 20 MG/ML IJ SOLN
10.0000 mg | Freq: Four times a day (QID) | INTRAMUSCULAR | Status: DC | PRN
  Filled 2020-09-20: qty 1

## 2020-09-20 MED FILL — AMLODIPINE BESYLATE 5 MG TA: 5 | 30 days supply | Qty: 30 | Fill #0

## 2020-09-20 NOTE — TOC Transition Note (Signed)
Transition of Care [facility]) - CM/SW Discharge Note   Patient Details  Name: Barbara Hill MRN: [id number] Date of Birth: 1941-06-06  Transition of [facility]) CM/SW Contact:  Kermit Balo, RN Phone Number: 09/20/2020, 12:04 PM   Clinical Narrative:    Pt discharging home with [facility] services through [facility].  Rollator for home to be delivered to the home. 3 in 1 to be delivered to patients room. Pt has support at home and transportation to home.   Final next level of care: Home w Home Health Services Barriers to Discharge: No Barriers Identified   Patient Goals and CMS Choice   CMS Medicare.gov Compare Post Acute Care list provided to:: Patient Represenative (must comment) Choice offered to / list presented to : Adult Children  Discharge Placement                       Discharge Plan and Services   Discharge Planning Services: CM Consult Post Acute Care Choice: Home Health,Durable Medical Equipment          DME Arranged: Walker rolling with seat DME Agency: [employer] Date DME Agency Contacted: 09/18/20   Representative spoke with at DME Agency: Velna Hatchet HH Arranged: PT,OT HH Agency: [facility] (formerly [employer] [facility]) Date HH Agency Contacted: 09/18/20   Representative spoke with at [facility] Agency: [place]  Social Determinants of Health (SDOH) Interventions     Readmission Risk Interventions No flowsheet data found.

## 2020-09-20 NOTE — Plan of Care (Signed)
  Problem: Education: Goal: Knowledge of disease or condition will improve Outcome: Progressing Goal: Knowledge of secondary prevention will improve Outcome: Progressing Goal: Individualized Educational Video(s) Outcome: Progressing   Problem: Coping: Goal: Will verbalize positive feelings about self Outcome: Progressing   Problem: Health Behavior/Discharge Planning: Goal: Ability to manage health-related needs will improve Outcome: Progressing

## 2020-09-20 NOTE — Discharge Summary (Signed)
Physician Discharge Summary  Barbara Hill CWC:[id number] DOB: August 17, 1941 DOA: 09/17/2020  PCP: Aletha Halim., PA-C  Admit date: 09/17/2020 Discharge date: 09/20/2020  Admitted From: Home Disposition: Home  Recommendations for Outpatient Follow-up:  1. Follow up with PCP in 1-2 weeks 2. Please obtain BMP/CBC in one week 3. Please follow up with PCP for repeat evaluation of urinary symptoms  Home Health: PT Equipment/Devices: None  Discharge Condition: Stable CODE STATUS: Full Diet recommendation: As tolerated  Brief/Interim Summary: Barbara Kreiger [name] a 80 y.o.femalewith medical history significant ofPSVT, status post stentofright MCAin2017complicated by CVA, PVD s/pembolectomy for popliteal occlusionspresents with complaints of left shoulder and hip pain. History is obtained from the patient as well as her daughter over the phone. The patient was witnessed falling over the chair at home yesterday morning and daughter thought that the left side of her mother's face was drooping. Due to her history of popliteal occlusion she was concerned for the possibility of blood clot. She did not receive any COVID-19 vaccinations. Telemetry neurology have been consulted CT of the head showed no acute intercranial abnormality, but did note chronic right basal ganglia lacunar infarcts. Patient was not a candidate for TPA given anticoagulation history. CTA of the head and neck significant for stent of the right MCA [id number] slightly increased from the 2.5 seen on 12/2017 and a 1-2 mm anterior communicating artery complex may reflect small aneurysm. Admitted for ongoing evaluation for weakness/pain.  Patient met as above with acute transient left arm and leg weakness likely limited to pain but cannot rule out TIA as imaging was negative no true CVA was noted.  Patient also noted to have symptoms consistent with UTI meeting sepsis criteria at admission.  She has completed her antibiotic  course otherwise stable and agreeable for discharge.  Patient incidentally swabbed positive for Covid, has no respiratory GI or mental status changes, as such no indication for acute Covid treatment.  Patient to follow-up with PCP in the next week for repeat evaluation and urinalysis given her frequent UTIs over the past few months may benefit from urology evaluation.  Otherwise patient's chronic pain and chronic comorbid conditions including CKD 3 a, PVD and hypertension appear to be well controlled.  Of note patient does have known history of right MCA aneurysm that appears to be minimally enlarged from previous imaging -from 2.5 mm in 2019 to 3 mm during this hospital stay.  Outpatient imaging per PCP.  Discharge Diagnoses:  Principal Problem:   TIA (transient ischemic attack) Active Problems:   Paroxysmal SVT (supraventricular tachycardia) (HCC)   Tobacco use disorder   Hypokalemia   Brain aneurysm   Normocytic anemia   CKD (chronic kidney disease), stage III ([facility]    Discharge Instructions  Discharge Instructions    Diet - low sodium heart healthy   Complete by: As directed    Increase activity slowly   Complete by: As directed      Allergies as of 09/20/2020      Reactions   Dobutamine Other (See Comments)   Heart beating hard with CP, neck pain, and weakness   Epinephrine Other (See Comments)   Fast heart beat   Aspirin Nausea And Vomiting   History of stomach ulcer   Clarithromycin Other (See Comments)   Yeast infection   Doxycycline Other (See Comments)   Makes stomach hurt   Excedrin Extra Strength [aspirin-acetaminophen-caffeine] Nausea And Vomiting   Penicillins Other (See Comments)   Yeast infection   Prednisone Other (See  Comments)   Shakes   Propoxyphene Nausea And Vomiting      Medication List    TAKE these medications   acetaminophen 650 MG CR tablet Commonly known as: TYLENOL Take 650 mg by mouth every 8 (eight) hours as needed for pain.    ALPRAZolam 0.5 MG tablet Commonly known as: XANAX Take 1 tablet (0.5 mg total) by mouth 3 (three) times daily as needed for anxiety. What changed: how much to take   amLODipine 5 MG tablet Commonly known as: NORVASC Take 1 tablet (5 mg total) by mouth daily. Start taking on: September 21, 2020   apixaban 5 MG Tabs tablet Commonly known as: ELIQUIS Take 1 tablet (5 mg total) by mouth 2 (two) times daily.   butalbital-acetaminophen-caffeine 50-325-40 MG tablet Commonly known as: FIORICET Take 1 tablet by mouth every 8 (eight) hours as needed for headache or migraine.   cholecalciferol 1000 units tablet Commonly known as: VITAMIN D Take 1,000 Units by mouth daily.   clopidogrel 75 MG tablet Commonly known as: PLAVIX Take 75 mg by mouth daily.   cyanocobalamin 1000 MCG tablet Take 1 tablet (1,000 mcg total) by mouth daily.   famotidine 20 MG tablet Commonly known as: PEPCID Take 20 mg by mouth at bedtime as needed for heartburn.   feeding supplement Liqd Take 237 mLs by mouth 2 (two) times daily between meals.   ferrous sulfate 325 (65 FE) MG tablet Take 1 tablet (325 mg total) by mouth 2 (two) times daily with a meal.   flecainide 50 MG tablet Commonly known as: TAMBOCOR Take 1 tablet (50 mg total) by mouth 2 (two) times daily. What changed: how much to take   FLUoxetine 20 MG capsule Commonly known as: PROZAC Take 20 mg by mouth 2 (two) times daily.   fluticasone 50 MCG/ACT nasal spray Commonly known as: FLONASE Place 1 spray into both nostrils daily as needed for allergies or rhinitis.   losartan 25 MG tablet Commonly known as: COZAAR Take 25 mg by mouth daily.   metoprolol succinate 25 MG 24 hr tablet Commonly known as: TOPROL-XL TAKE 1 TABLET BY MOUTH EVERY DAY AT BEDTIME   mirtazapine 7.5 MG tablet Commonly known as: REMERON Take 7.5 mg by mouth at bedtime.   multivitamin with minerals Tabs tablet Take 1 tablet by mouth daily.   omeprazole 40 MG  capsule Commonly known as: PRILOSEC Take 40 mg by mouth daily.   polyethylene glycol powder 17 GM/SCOOP powder Commonly known as: MiraLax Take 17 g by mouth 2 (two) times daily as needed for moderate constipation.   senna-docusate 8.6-50 MG tablet Commonly known as: Senokot-S Take 1 tablet by mouth 2 (two) times daily between meals as needed for mild constipation.            Durable Medical Equipment  (From admission, onward)         Start     Ordered   09/20/20 1017  For home use only DME 3 n 1  Once        09/20/20 1016   09/18/20 1545  For home use only DME 4 wheeled rolling walker with seat  Once       Question:  Patient needs a walker to treat with the following condition  Answer:  TIA (transient ischemic attack)   09/18/20 1544          Follow-up Information    Home, [facility] Follow up.   Specialty: Home Health Services Why: The  home health agency will contact you for the first home visit. Contact information: [address] [place] [address] [phone]              Allergies  Allergen Reactions  . Dobutamine Other (See Comments)    Heart beating hard with CP, neck pain, and weakness  . Epinephrine Other (See Comments)    Fast heart beat  . Aspirin Nausea And Vomiting    History of stomach ulcer  . Clarithromycin Other (See Comments)    Yeast infection  . Doxycycline Other (See Comments)    Makes stomach hurt  . Excedrin Extra Strength [Aspirin-Acetaminophen-Caffeine] Nausea And Vomiting  . Penicillins Other (See Comments)    Yeast infection  . Prednisone Other (See Comments)    Shakes   . Propoxyphene Nausea And Vomiting    Consultations:  None   Procedures/Studies: CT Angio Head W or Wo Contrast  Result Date: 09/17/2020 CLINICAL DATA:  Neuro deficit, acute, stroke suspected. Weakness, dizziness, facial droop this morning. History of brain aneurysm. EXAM: CT ANGIOGRAPHY HEAD AND NECK TECHNIQUE: Multidetector CT imaging  of the head and neck was performed using the standard protocol during bolus administration of intravenous contrast. Multiplanar CT image reconstructions and MIPs were obtained to evaluate the vascular anatomy. Carotid stenosis measurements (when applicable) are obtained utilizing NASCET criteria, using the distal internal carotid diameter as the denominator. CONTRAST:  92m OMNIPAQUE IOHEXOL 350 MG/ML SOLN COMPARISON:  Noncontrast head CT performed earlier today 09/17/2020. CT angiogram FINDINGS: CTA NECK FINDINGS Aortic arch: Atherosclerotic plaque within the visualized aortic arch and proximal major branch vessels of the neck. The origins of the innominate and left common carotid arteries are excluded from the field of view. Within this limitation, no hemodynamically significant innominate or proximal subclavian artery stenosis. Right carotid system: CCA and ICA patent within the neck without stenosis. No significant atherosclerotic disease. Left carotid system: The origin of the common carotid artery is not included in the field of view. Within this limitation, the CCA and ICA are patent within the neck without stenosis. No significant atherosclerotic disease. Vertebral arteries: Codominant and patent within the neck without stenosis. Minimal calcified plaque within the V1 right vertebral artery. Skeleton: No acute bony abnormality or aggressive osseous lesion. Trace C2-C3 grade 1 retrolisthesis. 2 mm C4-C5 grade 1 anterolisthesis. Trace C6-C7 grade 1 retrolisthesis. Cervical spondylosis. Most notably, there is advanced C5-C6 disc space narrowing. Other neck: No neck mass or cervical lymphadenopathy. Subcentimeter right thyroid lobe nodule not meeting consensus criteria for ultrasound follow-up Upper chest: No consolidation within the imaged lung apices. Centrilobular emphysema. Review of the MIP images confirms the above findings CTA HEAD FINDINGS Anterior circulation: The intracranial internal carotid arteries  are patent. Nonstenotic calcified plaque within both vessels. Redemonstrated flow diverter device within the M1 right MCA. Stent artifact size limits evaluation for in stent stenosis. However, there is flow proximal and distal to the flow diverter consistent with stent patency. A 3 mm aneurysm at site of the flow diverter has slightly increased in size as compared to the CTA of 01/07/2018 (previously 2.5 mm) (series 7, image 248). The M1 left middle cerebral artery is patent. No M2 proximal branch occlusion or high-grade proximal stenosis is identified. The anterior cerebral arteries are patent. 1-2 mm vascular protrusion projecting anteriorly from the anterior communicating artery complex (series 7, image 245) (series 9, image 110). In retrospect, this finding was present on the prior CT a of 01/07/2018 Posterior circulation: The intracranial  vertebral arteries are patent. The basilar artery is patent. The posterior cerebral arteries are patent. A right posterior communicating artery is present. The left posterior communicating artery is hypoplastic or absent. Venous sinuses: Within the limitations of contrast timing, no convincing thrombus. Anatomic variants: As described Review of the MIP images confirms the above findings IMPRESSION: CTA neck: The origins of the innominate and left common carotid arteries are excluded from the field of view. Within this limitation, the common carotid, internal carotid and vertebral arteries are patent within the neck without hemodynamically significant stenosis. CTA head: 1. Redemonstrated flow diverter device within the M1 right middle cerebral artery. A 3 mm aneurysm at this site has slightly increased in size as compared to the CTA head of 01/07/2018 (previously 2.5 mm) 2. Stent artifact limits evaluation for right MCA in-stent stenosis. However, flow is present proximal and distal to the stent consistent with stent patency. 3. Elsewhere, no intracranial large vessel  occlusion or proximal high-grade arterial stenosis is identified. 4. 1-2 mm anteriorly projecting vascular protrusion arising from the anterior communicating artery complex. This may reflect an additional small aneurysm or the origin of an otherwise poorly delineated small-vessel. In retrospect, this finding was present on the prior CTA. Electronically Signed   By: Kellie Simmering DO   On: 09/17/2020 20:19   CT Head Wo Contrast  Result Date: 09/17/2020 CLINICAL DATA:  Transient ischemic attack (TIA). Additional history provided: Weakness, dizziness, facial droop this morning. EXAM: CT HEAD WITHOUT CONTRAST TECHNIQUE: Contiguous axial images were obtained from the base of the skull through the vertex without intravenous contrast. COMPARISON:  Brain MRI 07/01/2016.  CT a head/neck 01/07/2018. FINDINGS: Brain: Mild cerebral and cerebellar atrophy. Redemonstrated chronic lacunar infarct within right caudate nucleus/internal capsule genu. Additional the infarct within the right lentiform nucleus. Background mild ill-defined hypoattenuation within the cerebral white matter is nonspecific but compatible with chronic small vessel ischemic disease. There is no acute intracranial hemorrhage. No demarcated cortical infarct. No extra-axial fluid collection. No evidence of intracranial mass. No midline shift. Vascular: No hyperdense vessel. Redemonstrated flow diverter device in the region of the proximal right middle cerebral artery. Skull: Normal. Negative for fracture or focal lesion. Sinuses/Orbits: Visualized orbits show no acute finding. No significant paranasal sinus disease at the imaged levels. IMPRESSION: No evidence of acute intracranial abnormality. Redemonstrated chronic right basal ganglia lacunar infarcts. Background mild generalized atrophy of the brain and mild chronic small vessel ischemic disease. Redemonstrated flow diverter device in the region of the proximal right middle cerebral artery. Electronically  Signed   By: Kellie Simmering DO   On: 09/17/2020 16:26   CT Angio Neck W and/or Wo Contrast  Result Date: 09/17/2020 CLINICAL DATA:  Neuro deficit, acute, stroke suspected. Weakness, dizziness, facial droop this morning. History of brain aneurysm. EXAM: CT ANGIOGRAPHY HEAD AND NECK TECHNIQUE: Multidetector CT imaging of the head and neck was performed using the standard protocol during bolus administration of intravenous contrast. Multiplanar CT image reconstructions and MIPs were obtained to evaluate the vascular anatomy. Carotid stenosis measurements (when applicable) are obtained utilizing NASCET criteria, using the distal internal carotid diameter as the denominator. CONTRAST:  23m OMNIPAQUE IOHEXOL 350 MG/ML SOLN COMPARISON:  Noncontrast head CT performed earlier today 09/17/2020. CT angiogram FINDINGS: CTA NECK FINDINGS Aortic arch: Atherosclerotic plaque within the visualized aortic arch and proximal major branch vessels of the neck. The origins of the innominate and left common carotid arteries are excluded from the field of view. Within this limitation, no  hemodynamically significant innominate or proximal subclavian artery stenosis. Right carotid system: CCA and ICA patent within the neck without stenosis. No significant atherosclerotic disease. Left carotid system: The origin of the common carotid artery is not included in the field of view. Within this limitation, the CCA and ICA are patent within the neck without stenosis. No significant atherosclerotic disease. Vertebral arteries: Codominant and patent within the neck without stenosis. Minimal calcified plaque within the V1 right vertebral artery. Skeleton: No acute bony abnormality or aggressive osseous lesion. Trace C2-C3 grade 1 retrolisthesis. 2 mm C4-C5 grade 1 anterolisthesis. Trace C6-C7 grade 1 retrolisthesis. Cervical spondylosis. Most notably, there is advanced C5-C6 disc space narrowing. Other neck: No neck mass or cervical  lymphadenopathy. Subcentimeter right thyroid lobe nodule not meeting consensus criteria for ultrasound follow-up Upper chest: No consolidation within the imaged lung apices. Centrilobular emphysema. Review of the MIP images confirms the above findings CTA HEAD FINDINGS Anterior circulation: The intracranial internal carotid arteries are patent. Nonstenotic calcified plaque within both vessels. Redemonstrated flow diverter device within the M1 right MCA. Stent artifact size limits evaluation for in stent stenosis. However, there is flow proximal and distal to the flow diverter consistent with stent patency. A 3 mm aneurysm at site of the flow diverter has slightly increased in size as compared to the CTA of 01/07/2018 (previously 2.5 mm) (series 7, image 248). The M1 left middle cerebral artery is patent. No M2 proximal branch occlusion or high-grade proximal stenosis is identified. The anterior cerebral arteries are patent. 1-2 mm vascular protrusion projecting anteriorly from the anterior communicating artery complex (series 7, image 245) (series 9, image 110). In retrospect, this finding was present on the prior CT a of 01/07/2018 Posterior circulation: The intracranial vertebral arteries are patent. The basilar artery is patent. The posterior cerebral arteries are patent. A right posterior communicating artery is present. The left posterior communicating artery is hypoplastic or absent. Venous sinuses: Within the limitations of contrast timing, no convincing thrombus. Anatomic variants: As described Review of the MIP images confirms the above findings IMPRESSION: CTA neck: The origins of the innominate and left common carotid arteries are excluded from the field of view. Within this limitation, the common carotid, internal carotid and vertebral arteries are patent within the neck without hemodynamically significant stenosis. CTA head: 1. Redemonstrated flow diverter device within the M1 right middle cerebral  artery. A 3 mm aneurysm at this site has slightly increased in size as compared to the CTA head of 01/07/2018 (previously 2.5 mm) 2. Stent artifact limits evaluation for right MCA in-stent stenosis. However, flow is present proximal and distal to the stent consistent with stent patency. 3. Elsewhere, no intracranial large vessel occlusion or proximal high-grade arterial stenosis is identified. 4. 1-2 mm anteriorly projecting vascular protrusion arising from the anterior communicating artery complex. This may reflect an additional small aneurysm or the origin of an otherwise poorly delineated small-vessel. In retrospect, this finding was present on the prior CTA. Electronically Signed   By: Kellie Simmering DO   On: 09/17/2020 20:19   MR BRAIN WO CONTRAST  Result Date: 09/18/2020 CLINICAL DATA:  Stroke follow-up.  Imbalance and facial droop. EXAM: MRI HEAD WITHOUT CONTRAST TECHNIQUE: Multiplanar, multiecho pulse sequences of the brain and surrounding structures were obtained without intravenous contrast. COMPARISON:  Head CT 09/17/2020 and MRI 07/01/2016 FINDINGS: Brain: No acute infarct, mass, midline shift, or extra-axial fluid collection is identified. A chronic microhemorrhage laterally in the right parietal lobe is unchanged from the prior  MRI. Chronic lacunar infarcts in the right basal ganglia are new from the prior MRI. T2 hyperintensities in the cerebral white matter bilaterally have progressed from the prior MRI and are nonspecific but compatible with mild chronic small vessel ischemic disease. There is mild cerebral atrophy. Vascular: Major intracranial vascular flow voids are preserved. Susceptibility artifact from a right MCA flow diverter device, more fully evaluated on yesterday's CTA. Skull and upper cervical spine: Unremarkable bone marrow signal. Sinuses/Orbits: Right cataract extraction. Small left mastoid effusion. Clear paranasal sinuses. Other: None. IMPRESSION: 1. No acute intracranial  abnormality. 2. Mild chronic small vessel ischemic disease. Electronically Signed   By: Logan Bores M.D.   On: 09/18/2020 13:44   CT ABDOMEN PELVIS W CONTRAST  Result Date: 09/17/2020 CLINICAL DATA:  80 year old female with abdominal pain. EXAM: CT ABDOMEN AND PELVIS WITH CONTRAST TECHNIQUE: Multidetector CT imaging of the abdomen and pelvis was performed using the standard protocol following bolus administration of intravenous contrast. CONTRAST:  44m OMNIPAQUE IOHEXOL 350 MG/ML SOLN COMPARISON:  CT abdomen pelvis dated 02/15/2015. FINDINGS: Lower chest: There is a 12 x 13 mm nodule at the right lung base. Further evaluation with dedicated chest CT is recommended. There are bibasilar linear atelectasis/scarring. No intra-abdominal free air or free fluid. Hepatobiliary: The liver is unremarkable. No intrahepatic biliary dilatation. Cholecystectomy. Pancreas: Unremarkable. No pancreatic ductal dilatation or surrounding inflammatory changes. Spleen: Normal in size without focal abnormality. Adrenals/Urinary Tract: The adrenal glands unremarkable. There is no hydronephrosis on either side. There is symmetric enhancement and excretion of contrast by both kidneys. Bilateral renal cysts measure up to 2 cm in the upper pole of the right kidney. Smaller bilateral hypodensities are not characterized. The urinary bladder is grossly unremarkable. Stomach/Bowel: There is severe sigmoid diverticulosis without active inflammatory changes. There is no bowel obstruction or active inflammation. Moderate stool noted in the rectal vault. Appendectomy. Vascular/Lymphatic: Moderate aortoiliac atherosclerotic disease. The IVC is unremarkable. No portal venous gas. There is no adenopathy. Reproductive: Hysterectomy. No adnexal masses. Other: None Musculoskeletal: Osteopenia with degenerative changes of the spine. No acute osseous pathology. IMPRESSION: 1. No acute intra-abdominal or pelvic pathology. 2. Severe sigmoid  diverticulosis. No bowel obstruction. 3. A 12 x 13 mm right lung base nodule. Dedicated chest CT is recommended. 4. Aortic Atherosclerosis (ICD10-I70.0). Electronically Signed   By: AAnner CreteM.D.   On: 09/17/2020 19:34   DG CHEST PORT 1 VIEW  Result Date: 09/18/2020 CLINICAL DATA:  Cough EXAM: PORTABLE CHEST 1 VIEW COMPARISON:  January 28, 2020 FINDINGS: Lungs are hyperexpanded. There is no evident edema or airspace opacity. Heart is mildly enlarged with pulmonary vascularity normal. No adenopathy. No bone lesions. IMPRESSION: Lungs hyperexpanded. No edema or airspace opacity. Stable cardiac prominence. Electronically Signed   By: WLowella GripIII M.D.   On: 09/18/2020 14:07   DG Hip Unilat With Pelvis 2-3 Views Left  Result Date: 09/17/2020 CLINICAL DATA:  Left hip injury. EXAM: DG HIP (WITH OR WITHOUT PELVIS) 2-3V LEFT COMPARISON:  CT 01/25/2020. FINDINGS: Degenerative changes lumbar spine and both hips. No acute bony or joint abnormality. Degenerative changes particularly prominent about the left hip. No evidence of fracture dislocation. Aortoiliac atherosclerotic vascular calcification. IMPRESSION: Degenerative changes lumbar spine and both hips. Degenerative changes particularly prominent about the left hip. No acute abnormality identified. Electronically Signed   By: TMarcello Moores Register   On: 09/17/2020 14:08   VAS [place]OWER EXTREMITY VENOUS (DVT)  Result Date: 09/18/2020  Lower Venous DVT Study Indications: Pain.  Comparison Study: no prior Performing Technologist: Abram Sander RVS  Examination Guidelines: A complete evaluation includes B-mode imaging, spectral Doppler, color Doppler, and power Doppler as needed of all accessible portions of each vessel. Bilateral testing is considered an integral part of a complete examination. Limited examinations for reoccurring indications may be performed as noted. The reflux portion of the exam is performed with the patient in reverse Trendelenburg.   +---------+---------------+---------+-----------+----------+--------------+ RIGHT    CompressibilityPhasicitySpontaneityPropertiesThrombus Aging +---------+---------------+---------+-----------+----------+--------------+ CFV      Full           Yes      Yes                                 +---------+---------------+---------+-----------+----------+--------------+ SFJ      Full                                                        +---------+---------------+---------+-----------+----------+--------------+ FV Prox  Full                                                        +---------+---------------+---------+-----------+----------+--------------+ FV Mid   Full                                                        +---------+---------------+---------+-----------+----------+--------------+ FV DistalFull                                                        +---------+---------------+---------+-----------+----------+--------------+ PFV      Full                                                        +---------+---------------+---------+-----------+----------+--------------+ POP      Full           Yes      Yes                                 +---------+---------------+---------+-----------+----------+--------------+ PTV      Full                                                        +---------+---------------+---------+-----------+----------+--------------+ PERO     Full                                                        +---------+---------------+---------+-----------+----------+--------------+   +---------+---------------+---------+-----------+----------+-------------------+  LEFT     CompressibilityPhasicitySpontaneityPropertiesThrombus Aging      +---------+---------------+---------+-----------+----------+-------------------+ CFV      Full           Yes      Yes                                       +---------+---------------+---------+-----------+----------+-------------------+ SFJ      Full                                                             +---------+---------------+---------+-----------+----------+-------------------+ FV Prox  Full                                                             +---------+---------------+---------+-----------+----------+-------------------+ FV Mid   Full                                                             +---------+---------------+---------+-----------+----------+-------------------+ FV DistalFull                                                             +---------+---------------+---------+-----------+----------+-------------------+ PFV      Full                                                             +---------+---------------+---------+-----------+----------+-------------------+ POP      Full           Yes      Yes                                      +---------+---------------+---------+-----------+----------+-------------------+ PTV      Full                                                             +---------+---------------+---------+-----------+----------+-------------------+ PERO                                                  Not well visualized +---------+---------------+---------+-----------+----------+-------------------+     Summary: BILATERAL: - No evidence of  deep vein thrombosis seen in the lower extremities, bilaterally. - No evidence of superficial venous thrombosis in the lower extremities, bilaterally. -No evidence of popliteal cyst, bilaterally.   *See table(s) above for measurements and observations. Electronically signed by Harold Barban MD on 09/18/2020 at 9:37:12 PM.    Final      Subjective: No acute issues or events overnight   Discharge Exam: Vitals:   09/20/20 0821 09/20/20 1131  BP: (!) 176/67 (!) 143/62  Pulse: (!) 57 60  Resp: 19 19  Temp: 98.2 F  (36.8 C) 98.7 F (37.1 C)  SpO2: 98% 98%   Vitals:   09/20/20 0018 09/20/20 0304 09/20/20 0821 09/20/20 1131  BP: (!) 195/91 (!) 180/66 (!) 176/67 (!) 143/62  Pulse: 62 (!) 52 (!) 57 60  Resp: _0 Temp: 97.8 F (36.6 C) 98.6 F (37 C) 98.2 F (36.8 C) 98.7 F (37.1 C)  TempSrc: Oral Oral Oral Oral  SpO2: 98% 99% 98% 98%  Weight:      Height:        General: Pt is alert, awake, not in acute distress Cardiovascular: RRR, S1/S2 +, no rubs, no gallops Respiratory: CTA bilaterally, no wheezing, no rhonchi Abdominal: Soft, NT, ND, bowel sounds + Extremities: no edema, no cyanosis    The results of significant diagnostics from this hospitalization (including imaging, microbiology, ancillary and laboratory) are listed below for reference.     Microbiology: Recent Results (from the past 240 hour(s))  Urine Culture     Status: Abnormal   Collection Time: 09/17/20  6:17 PM   Specimen: Urine, Random  Result Value Ref Range Status   Specimen Description   Final    URINE, RANDOM Performed at [facility], [address] [place], [place] [address]    Special Requests   Final    NONE Performed at [facility], [address] [place], [place] [address]    Culture >=100,000 COLONIES/mL KLEBSIELLA PNEUMONIAE (A)  Final   Report Status 09/20/2020 FINAL  Final   Organism ID, Bacteria KLEBSIELLA PNEUMONIAE (A)  Final      Susceptibility   Klebsiella pneumoniae - MIC*    AMPICILLIN RESISTANT Resistant     CEFAZOLIN <=4 SENSITIVE Sensitive     CEFEPIME <=0.12 SENSITIVE Sensitive     CEFTRIAXONE <=0.25 SENSITIVE Sensitive     CIPROFLOXACIN <=0.25 SENSITIVE Sensitive     GENTAMICIN <=1 SENSITIVE Sensitive     IMIPENEM 0.5 SENSITIVE Sensitive     NITROFURANTOIN <=16 SENSITIVE Sensitive     TRIMETH/SULFA <=20 SENSITIVE Sensitive     AMPICILLIN/SULBACTAM 4 SENSITIVE Sensitive     PIP/TAZO <=4 SENSITIVE Sensitive     * >=100,000 COLONIES/mL KLEBSIELLA  PNEUMONIAE  SARS Coronavirus 2 by RT PCR (hospital order, performed in [place] hospital lab) Nasopharyngeal Nasopharyngeal Swab     Status: Abnormal   Collection Time: 09/17/20  8:44 PM   Specimen: Nasopharyngeal Swab  Result Value Ref Range Status   SARS Coronavirus 2 POSITIVE (A) NEGATIVE Final    Comment: RESULT CALLED TO, READ BACK BY AND VERIFIED WITH: KELLIE NEAL RN ON 09/17/20 AT 2158 [facility] (NOTE) SARS-CoV-2 target nucleic acids are DETECTED  SARS-CoV-2 RNA is generally detectable in upper respiratory specimens  during the acute phase of infection.  Positive results are indicative  of the presence of the identified virus, but do not rule out bacterial infection or co-infection with other pathogens not detected by the test.  Clinical correlation with patient history and  other diagnostic information is necessary to determine patient infection status.  The expected result is negative.  Fact Sheet for Patients:   StrictlyIdeas.no   Fact Sheet for Healthcare Providers:   BankingDealers.co.za    This test is not yet approved or cleared by the [place] FDA and  has been authorized for detection and/or diagnosis of SARS-CoV-2 by FDA under an Emergency Use Authorization (EUA).  This EUA will remain in effect (meaning this  test can be used) for the duration of  the COVID-19 declaration under Section 564(b)(1) of the Act, 21 U.S.C. section 360-bbb-3(b)(1), unless the authorization is terminated or revoked sooner.  Performed at [facility], [address] [place], [place] [address]      Labs: BNP (last 3 results) Recent Labs    09/18/20 1527  BNP 542.7*   Basic Metabolic Panel: Recent Labs  Lab 09/17/20 1324 09/17/20 1817 09/18/20 1139 09/19/20 0411 09/20/20 0737  NA 138 136 136 137 136  K 3.4* 3.3* 3.1* 2.9* 3.8  CL 104 101 101 102 102  CO2 _0 GLUCOSE 92 100* 88 94 90  BUN _1 7*  CREATININE 1.09* 1.13* 0.99 0.94 0.77  CALCIUM 8.8* 8.8* 7.9* 8.0* 8.2*  MG  --   --  1.3* 1.1* 1.1*  PHOS  --   --   --  3.1 2.7   Liver Function Tests: Recent Labs  Lab 09/17/20 1324 09/17/20 1817 09/19/20 0411 09/20/20 0737  AST 41 43* 31 24  ALT _2 ALKPHOS 84 82 65 68  BILITOT 0.2* 0.3 0.5 0.7  PROT 6.9 7.2 5.8* 5.8*  ALBUMIN 3.3* 3.4* 2.5* 2.6*   No results for input(s): LIPASE, AMYLASE in the last 168 hours. No results for input(s): AMMONIA in the last 168 hours. CBC: Recent Labs  Lab 09/17/20 1324 09/17/20 1817 09/18/20 1139 09/19/20 0411 09/20/20 0737  WBC 4.4 5.6 5.4 5.2 4.3  NEUTROABS  --  3.2  --  3.0 2.4  HGB 10.6* 11.2* 11.1* 10.7* 10.5*  HCT 32.6* 33.9* 33.9* 31.9* 32.4*  MCV 92.9 91.4 92.6 92.5 92.0  PLT 206 206 183 181 164   Cardiac Enzymes: No results for input(s): CKTOTAL, CKMB, CKMBINDEX, TROPONINI in the last 168 hours. BNP: Invalid input(s): POCBNP CBG: No results for input(s): GLUCAP in the last 168 hours. D-Dimer Recent Labs    09/19/20 0411 09/20/20 0737  DDIMER 0.83* 0.52*   Hgb A1c No results for input(s): HGBA1C in the last 72 hours. Lipid Profile No results for input(s): CHOL, HDL, LDLCALC, TRIG, CHOLHDL, LDLDIRECT in the last 72 hours. Thyroid function studies No results for input(s): TSH, T4TOTAL, T3FREE, THYROIDAB in the last 72 hours.  Invalid input(s): FREET3 Anemia work up Recent Labs    09/19/20 0411 09/20/20 0737  FERRITIN 249 211   Urinalysis    Component Value Date/Time   COLORURINE YELLOW 09/17/2020 1817   APPEARANCEUR CLOUDY (A) 09/17/2020 1817   APPEARANCEUR Clear 09/08/2012 0030   LABSPEC 1.030 09/17/2020 1817   LABSPEC 1.013 09/08/2012 0030   PHURINE 6.0 09/17/2020 1817   GLUCOSEU NEGATIVE 09/17/2020 1817   GLUCOSEU Negative 09/08/2012 0030   HGBUR LARGE (A) 09/17/2020 1817   BILIRUBINUR NEGATIVE 09/17/2020 1817   BILIRUBINUR Negative 09/08/2012 0030   KETONESUR NEGATIVE 09/17/2020  1817   PROTEINUR NEGATIVE 09/17/2020 1817   UROBILINOGEN 0.2 01/18/2015 2006   NITRITE  POSITIVE (A) 09/17/2020 1817   LEUKOCYTESUR MODERATE (A) 09/17/2020 1817   LEUKOCYTESUR Negative 09/08/2012 0030   Sepsis Labs Invalid input(s): PROCALCITONIN,  WBC,  LACTICIDVEN Microbiology Recent Results (from the past 240 hour(s))  Urine Culture     Status: Abnormal   Collection Time: 09/17/20  6:17 PM   Specimen: Urine, Random  Result Value Ref Range Status   Specimen Description   Final    URINE, RANDOM Performed at [facility], [address] [place], [place] [address]    Special Requests   Final    NONE Performed at [facility], [address] [place], [place] [address]    Culture >=100,000 COLONIES/mL KLEBSIELLA PNEUMONIAE (A)  Final   Report Status 09/20/2020 FINAL  Final   Organism ID, Bacteria KLEBSIELLA PNEUMONIAE (A)  Final      Susceptibility   Klebsiella pneumoniae - MIC*    AMPICILLIN RESISTANT Resistant     CEFAZOLIN <=4 SENSITIVE Sensitive     CEFEPIME <=0.12 SENSITIVE Sensitive     CEFTRIAXONE <=0.25 SENSITIVE Sensitive     CIPROFLOXACIN <=0.25 SENSITIVE Sensitive     GENTAMICIN <=1 SENSITIVE Sensitive     IMIPENEM 0.5 SENSITIVE Sensitive     NITROFURANTOIN <=16 SENSITIVE Sensitive     TRIMETH/SULFA <=20 SENSITIVE Sensitive     AMPICILLIN/SULBACTAM 4 SENSITIVE Sensitive     PIP/TAZO <=4 SENSITIVE Sensitive     * >=100,000 COLONIES/mL KLEBSIELLA PNEUMONIAE  SARS Coronavirus 2 by RT PCR (hospital order, performed in [place] hospital lab) Nasopharyngeal Nasopharyngeal Swab     Status: Abnormal   Collection Time: 09/17/20  8:44 PM   Specimen: Nasopharyngeal Swab  Result Value Ref Range Status   SARS Coronavirus 2 POSITIVE (A) NEGATIVE Final    Comment: RESULT CALLED TO, READ BACK BY AND VERIFIED WITH: KELLIE NEAL RN ON 09/17/20 AT 2158 [facility] (NOTE) SARS-CoV-2 target nucleic acids are DETECTED  SARS-CoV-2 RNA is generally detectable in  upper respiratory specimens  during the acute phase of infection.  Positive results are indicative  of the presence of the identified virus, but do not rule out bacterial infection or co-infection with other pathogens not detected by the test.  Clinical correlation with patient history and  other diagnostic information is necessary to determine patient infection status.  The expected result is negative.  Fact Sheet for Patients:   StrictlyIdeas.no   Fact Sheet for Healthcare Providers:   BankingDealers.co.za    This test is not yet approved or cleared by the [place] FDA and  has been authorized for detection and/or diagnosis of SARS-CoV-2 by FDA under an Emergency Use Authorization (EUA).  This EUA will remain in effect (meaning this  test can be used) for the duration of  the COVID-19 declaration under Section 564(b)(1) of the Act, 21 U.S.C. section 360-bbb-3(b)(1), unless the authorization is terminated or revoked sooner.  Performed at [facility], [address] [place], [place] [address]      Time coordinating discharge: Over 30 minutes  SIGNED:   Little Ishikawa, DO [facility] 09/20/2020, 12:19 PM Pager   If 7PM-7AM, please contact night-coverage [email or web address]

## 2020-09-20 NOTE — Progress Notes (Signed)
Initial Nutrition Assessment  DOCUMENTATION CODES:   Underweight  INTERVENTION:   -Downgrade diet to dysphagia 3 (advanced mechanical soft) diet for energy conservation and ease of intake -MVI with minerals daily -Ensure Enlive po TID, each supplement provides 350 kcal and 20 grams of protein  NUTRITION DIAGNOSIS:   Increased nutrient needs related to chronic illness (COPD) as evidenced by estimated needs.  GOAL:   Patient will meet greater than or equal to 90% of their needs  MONITOR:   PO intake,Supplement acceptance,Labs,Weight trends,Skin,I & O's  REASON FOR ASSESSMENT:   Malnutrition Screening Tool    ASSESSMENT:   Barbara Hill is a 80 y.o. 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   Reviewed I/O's: -500 ml x 24 hours and +100 ml since admission  UOP: 500 ml x 24 hours  Pt unavailable at time of visit.   No meal completions available to assess at this time.   Reviewed wt hx; pt has experienced a 11.6% wt loss over the past 7 months. While not significant for time frame, this is concerning give underweight status, advanced age, and multiple co-morbidities. Pt has been identified with severe malnutrition on the context of chronic illness during a prior hospitalization. Suspect malnutrition is ongoing, however, unable to identify at this time.   Pt is currently on a soft diet, which is a surgical soft (low fiber) diet designed for pts with acute or chronic GI conditions or exacerbations or for pts who recently underwent GI surgery. Pt would better benefit from a mechanically altered (dysphagia 3) diet for energy conservation.   Per therapy notes, recommending home health services once medically stable for discharge.   Medications reviewed and include vitamin C and zinc.   Labs  reviewed: K: 2.9, Mg: 1.1.   Diet Order:   Diet Order            DIET SOFT Room service appropriate? Yes; Fluid consistency: Thin  Diet effective now                 EDUCATION NEEDS:   No education needs have been identified at this time  Skin:  Skin Assessment: Reviewed RN Assessment  Last BM:  09/18/20  Height:   Ht Readings from Last 1 Encounters:  09/18/20 5\' 3"  (1.6 m)    Weight:   Wt Readings from Last 1 Encounters:  09/18/20 44.4 kg    Ideal Body Weight:  52.3 kg  BMI:  Body mass index is 17.34 kg/m.  Estimated Nutritional Needs:   Kcal:  1600-1800  Protein:  85-100 grams  Fluid:  > 1.6 L    09/20/20, RD, LDN, CDCES Registered Dietitian II Certified Diabetes Care and Education Specialist Please refer to [facility] for RD and/or RD on-call/weekend/after hours pager

## 2020-09-20 NOTE — Progress Notes (Signed)
Pt w/ persistent HTN 195/91 after 250 mg Metop XL at 2152. Dr. Leafy Half informed.

## 2020-09-24 ENCOUNTER — Other Ambulatory Visit (HOSPITAL_COMMUNITY): Payer: Self-pay | Admitting: Student

## 2020-09-24 MED ORDER — CLOPIDOGREL BISULFATE 75 MG PO TABS: 75.0000 mg | ORAL_TABLET | Freq: Every day | ORAL | 3 refills | Status: DC

## 2020-09-30 NOTE — Progress Notes (Addendum)
[facility]    Provider:  Dr Lucia Gaskins Referring Provider: Richmond Campbell., PA-C Primary Care Physician:  Richmond Campbell., PA-C  CC: memory changes  HPI 09/30/2020: Patient is a 80 year old female who was seen in 2017 for vertigo and aneurysm.  Today she is here for memory loss.  Past medical history of current tobacco abuse, COPD, UTI, cerebral embolism with infarction, heart murmur, Anxiety,  migraines, A. fib, dizziness, 1st degree AV block, syncope and dizziness, aneurysm, orthostatic hypotension.   She is here with her daughter. She says that Mady Gemma sent her here for another opinion but she doesn't know why she is here. She says she feels fine. Her daughter provides most information. Patient has had memory changes for at least a year but worsening since July when hospitalized. She was hospitalized recent;y, she couldn't sit up, that has improved, her potassium was low and she had some one-sided weakness, they thought she had a TIA, weakness and facial droop resolved, brother has dementia but unknown what kind. She is getting her home mixed up, she is not driving, more short term memory loss but long term memory is ok, slowly progressive for years, needs help with medication management, cooking and cleaning, patient can wash clothes and showering and dressing, she is never alone. She is having hallucinations and delusions. She will sometimes repeating things in the same day, she gets angry (she says because she gets angry when people say she   MRI brain 09/18/2020:  IMPRESSION: 1. No acute intracranial abnormality. 2. Mild chronic small vessel ischemic disease.   I reviewed Barbara Hill most recent notes: Patient reported onset of memory loss was 1 to 6 months ago, gradual quality, also reported day night behavior changes, behavioral problems including agitation, family and patient have concerns for memory loss including medication errors, driving, cooking and  preparing meals, the family monitor his medication usage and family prepares medications, patient lives with adult children and significant other, her grandson is in the house during the day, and she lives in her own home.     HPI 05/27/2016:  Barbara Hill is a referral from Dr. Arlyce Dice for vertigo and aneurysm. Past medical history of migraine and atrial fibrillation. She has COPD and continues to smoke, she smells heavily of smoke today. She gets dizzy when she stands up. She has been diagnosed with orthostatic hypotension. She only drinks 2 glasses of water a day the rest is soda. She does not like water. She has been told to drink more water and she has not. She has been diagnosed with orthostatic hypotension as recently as August when she was inpatient(Orthostatic hypotension 123/65 supine to 77/52 standing in the ED). She gets dizzy when she stands up. She stood up quickly and she got dizzy and her head hurt and she fell in the past. Always on standing especially when too quick. Denies vertigo.  The dizziness has been ongoing for years at least several years.. She denies room spinning. Always when she stands up. No room spinning. Does not happen in bed when she rolls over. Not when sitting or standing. She is not wearing her compression stockings. Dizziness happens daily. Yesterday drank 3 glasses of water with improvement. She drinks pepsi during the day 2 and then 2 glasses of water at most. She is also here to discuss aneurysm seen on imaging recently.   Reviewed notes, labs and imaging from outside physicians, which showed:  Personally reviewed MRI brain and MRA head images  and agree with the following:   TSH 1.07 nml,  : MRI brain: Mild to moderate atrophy and small vessel disease. No acute intracranial findings.personally reviewed images and agree.   MRA NECK FINDINGS  Branching pattern of the brachiocephalic vessels from the arch is normal. No origin stenoses. Both common carotid  arteries are widely patent to the bifurcation. No carotid bifurcation atherosclerotic disease. No stenosis or irregularity. Both cervical internal carotid arteries are normal.  Both vertebral arteries are patent, but show some atherosclerotic disease. 30-50% stenosis of both vertebral artery origins and mild atherosclerotic irregularity of the proximal vertebral arteries. Both vertebral arteries are patent through the neck to the basilar.  MRA HEAD FINDINGS  Both internal carotid arteries are widely patent through the skullbase. The anterior and middle cerebral vessels are patent bilaterally without proximal stenosis. There is an aneurysm at the right MCA bifurcation measuring up to 8 mm in diameter. No other anterior circulation aneurysm.  Both vertebral arteries are patent to the basilar. No basilar stenosis. Posterior circulation branch vessels are patent. No posterior circulation aneurysm.  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 No other intracranial aneurysm.  No carotid bifurcation atherosclerotic disease.  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   BUN 7, creatinine 0.73, TSH 1.30 March 2016. Review of Systems: Patient complains of symptoms per HPI as well as the following symptoms: cough, fatigue. Pertinent negatives and positives per HPI. All others negative   Social History   Socioeconomic History   Marital status: Widowed    Spouse name: Not on file   Number of children: 2   Years of education: 59   Highest education level: Not on file  Occupational History   Occupation: Retired  Tobacco Use   Smoking status: Current Every Day Smoker    Packs/day: 1.00    Years: 50.00    Pack years: 50.00    Types: Cigarettes   Smokeless tobacco: Never Used   Tobacco comment: now about 0.5 ppd   Vaping Use   Vaping Use: Never used  Substance and Sexual  Activity   Alcohol use: No   Drug use: No   Sexual activity: Not on file  Other Topics Concern   Not on file  Social History Narrative   Lives with daughter and her family   Caffeine use: none   Social Determinants of Corporate investment banker Strain: Not on file  Food Insecurity: Not on file  Transportation Needs: Not on file  Physical Activity: Not on file  Stress: Not on file  Social Connections: Not on file  Intimate Partner Violence: Not on file    Family History  Problem Relation Age of Onset   Heart attack Mother    Transient ischemic attack Mother    Dementia Mother        "at the end"   Heart attack Father 33   Dementia Brother    Heart attack Other    Asthma Other    Heart failure Other    Osteoporosis Other    Heart Problems Brother    CAD Other     Past Medical History:  Diagnosis Date   Anxiety    Brain aneurysm    pipeline stent 2017    COPD (chronic obstructive pulmonary disease) (HCC)    Fatigue    H/O: hysterectomy    Headache(784.0)    History of cardiac catheterization    LHC  10/02: Normal coronary arteries   History of nuclear stress test    Myoview 10/12: Normal stress nuclear study.   Hypertension    Mitral valve prolapse    Echo 8/17:Mild LVH, EF 60-65, normal wall motion, grade 2 diastolic dysfunction, mild AI, mild MR, mild LAE // echo 11/01: EF 60%, mild anterior MVP with trivial MR, PASP 35-40 mmHg, LAE   Orthostasis    PSVT (paroxysmal supraventricular tachycardia) (HCC)    palps   Radiculopathy     Past Surgical History:  Procedure Laterality Date   ABDOMINAL HYSTERECTOMY     APPENDECTOMY     BUBBLE STUDY  01/29/2020   Procedure: BUBBLE STUDY;  Surgeon: Parke Poisson, MD;  Location: [facility] ENDOSCOPY;  Service: Cardiology;;   CARDIAC CATHETERIZATION  12/95   CATARACT EXTRACTION     CHOLECYSTECTOMY     EMBOLECTOMY Bilateral 01/25/2020   Procedure: Bilateral popliteal EMBOLECTOMY.;   Surgeon: [name], MD;  Location: [facility] OR;  Service: Vascular;  Laterality: Bilateral;   FASCIOTOMY Bilateral 01/26/2020   Procedure: FASCIOTOMY;  Surgeon: [name], MD;  Location: [facility] OR;  Service: Vascular;  Laterality: Bilateral;   IR GENERIC HISTORICAL  06/02/2016   IR RADIOLOGIST EVAL & MGMT 06/02/2016 [facility]   IR GENERIC HISTORICAL  06/29/2016   IR ANGIO INTRA EXTRACRAN SEL INTERNAL CAROTID UNI R MOD SED 06/29/2016 [name], MD [facility]   IR GENERIC HISTORICAL  06/29/2016   IR ANGIOGRAM FOLLOW UP STUDY 06/29/2016 [name], MD [facility]   IR GENERIC HISTORICAL  06/29/2016   IR NEURO EACH ADD'L AFTER BASIC UNI RIGHT (MS) 06/29/2016 [name], MD [facility]   IR GENERIC HISTORICAL  06/29/2016   IR 3D INDEPENDENT WKST 06/29/2016 [name], MD [facility]   IR GENERIC HISTORICAL  06/29/2016   IR TRANSCATH/EMBOLIZ 06/29/2016 [name], MD [facility]   IR GENERIC HISTORICAL  06/29/2016   IR ANGIO VERTEBRAL SEL SUBCLAVIAN INNOMINATE UNI R MOD SED 06/29/2016 [name], MD [facility]   IR GENERIC HISTORICAL  07/28/2016   IR RADIOLOGIST EVAL & MGMT 07/28/2016 [facility]   IR GENERIC HISTORICAL  11/12/2016   IR ANGIO INTRA EXTRACRAN SEL COM CAROTID INNOMINATE BILAT MOD SED 11/12/2016 [name], MD [facility]   IR GENERIC HISTORICAL  11/12/2016   IR ANGIO VERTEBRAL SEL VERTEBRAL UNI L MOD SED 11/12/2016 [name], MD [facility]   IR GENERIC HISTORICAL  11/12/2016   IR ANGIO VERTEBRAL SEL SUBCLAVIAN INNOMINATE UNI R MOD SED 11/12/2016 [name], MD [facility]   NASAL SEPTUM SURGERY     RADIOLOGY WITH ANESTHESIA N/A 06/29/2016   Procedure: EMBOLIZATION;  Surgeon: [name], MD;  Location: [facility] OR;  Service: Radiology;  Laterality: N/A;   ROTATOR CUFF REPAIR     TEE WITHOUT CARDIOVERSION N/A 01/29/2020   Procedure: TRANSESOPHAGEAL ECHOCARDIOGRAM (TEE);  Surgeon: Parke Poisson,  MD;  Location: [facility] ENDOSCOPY;  Service: Cardiology;  Laterality: N/A;    Current Outpatient Medications  Medication Sig Dispense Refill   acetaminophen (TYLENOL) 650 MG CR tablet Take 650 mg by mouth every 8 (eight) hours as needed for pain.     ALPRAZolam (XANAX) 0.5 MG tablet Take 1 tablet (0.5 mg total) by mouth 3 (three) times daily as needed for anxiety. (Patient taking differently: Take 0.25-0.5 mg by mouth 3 (three) times daily as needed for anxiety.) 30 tablet 0   amLODipine (NORVASC) 5 MG tablet Take 1 tablet (5 mg total) by mouth  daily. 30 tablet 0   apixaban (ELIQUIS) 5 MG TABS tablet Take 1 tablet (5 mg total) by mouth 2 (two) times daily. 60 tablet 1   butalbital-acetaminophen-caffeine (FIORICET) 50-325-40 MG tablet Take 1 tablet by mouth every 8 (eight) hours as needed for headache or migraine. 14 tablet 3   cholecalciferol (VITAMIN D) 1000 units tablet Take 1,000 Units by mouth daily.     clopidogrel (PLAVIX) 75 MG tablet Take 1 tablet (75 mg total) by mouth daily. 30 tablet 3   famotidine (PEPCID) 20 MG tablet Take 20 mg by mouth at bedtime as needed for heartburn.     feeding supplement, ENSURE ENLIVE, (ENSURE ENLIVE) LIQD Take 237 mLs by mouth 2 (two) times daily between meals. 237 mL 12   flecainide (TAMBOCOR) 50 MG tablet Take 1 tablet (50 mg total) by mouth 2 (two) times daily. (Patient taking differently: Take 75 mg by mouth 2 (two) times daily.) 180 tablet 1   FLUoxetine (PROZAC) 20 MG capsule Take 20 mg by mouth 2 (two) times daily.  11   fluticasone (FLONASE) 50 MCG/ACT nasal spray Place 1 spray into both nostrils daily as needed for allergies or rhinitis.   11   losartan (COZAAR) 25 MG tablet Take 25 mg by mouth daily.     metoprolol succinate (TOPROL-XL) 25 MG 24 hr tablet TAKE 1 TABLET BY MOUTH EVERY DAY AT BEDTIME (Patient taking differently: Take 25 mg by mouth at bedtime.) 30 tablet 7   mirtazapine (REMERON) 7.5 MG tablet Take 7.5 mg by mouth at  bedtime.     Multiple Vitamin (MULTIVITAMIN WITH MINERALS) TABS tablet Take 1 tablet by mouth daily.     omeprazole (PRILOSEC) 40 MG capsule Take 40 mg by mouth daily.     polyethylene glycol powder (MIRALAX) 17 GM/SCOOP powder Take 17 g by mouth 2 (two) times daily as needed for moderate constipation. 255 g 0   senna-docusate (SENOKOT-S) 8.6-50 MG tablet Take 1 tablet by mouth 2 (two) times daily between meals as needed for mild constipation. 60 tablet 0   vitamin B-12 1000 MCG tablet Take 1 tablet (1,000 mcg total) by mouth daily.     ferrous sulfate 325 (65 FE) MG tablet Take 1 tablet (325 mg total) by mouth 2 (two) times daily with a meal. (Patient not taking: Reported on 09/18/2020) 180 tablet 1   No current facility-administered medications for this visit.    Allergies as of 10/01/2020 - Review Complete 10/01/2020  Allergen Reaction Noted   Dobutamine Other (See Comments) 05/13/2011   Epinephrine Other (See Comments) 04/20/2011   Aspirin Nausea And Vomiting 04/10/2019   Clarithromycin Other (See Comments) 04/20/2011   Doxycycline Other (See Comments) 04/20/2011   Excedrin extra strength [aspirin-acetaminophen-caffeine] Nausea And Vomiting 04/09/2014   Penicillins Other (See Comments) 04/20/2011   Prednisone Other (See Comments) 04/20/2011   Propoxyphene Nausea And Vomiting 04/20/2011    Vitals: BP (!) 185/86 (BP Location: Left Arm, Patient Position: Sitting)    Pulse 61    Ht 5\' 3"  (1.6 m)    Wt 93 lb (42.2 kg)    BMI 16.47 kg/m  Last Weight:  Wt Readings from Last 1 Encounters:  10/01/20 93 lb (42.2 kg)   Last Height:   Ht Readings from Last 1 Encounters:  10/01/20 5\' 3"  (1.6 m)     Physical exam: Exam: Gen: NAD, frail, thin , poorly groomed smells heavily of smoke  CV: RRR, no MRG. No Carotid Bruits. No peripheral edema, warm, nontender Eyes: Conjunctivae clear without exudates or hemorrhage  Neuro: Detailed Neurologic Exam  Speech  and cognition: MMSE - Mini Mental State Exam 10/01/2020  Orientation to time 4  Orientation to Place 4  Registration 3  Attention/ Calculation 3  Recall 2  Language- name 2 objects 2  Language- repeat 1  Language- follow 3 step command 3  Language- read & follow direction 1  Write a sentence 1  Copy design 1  Total score 25       Speech is normal; fluent .  Cognition:    The patient is oriented to person, place, and time;     recent and remote memory impaired;     language fluent;     Impaired attention, concentration,     fund of knowledge impaired Cranial Nerves:    The pupils are equal, round, and reactive to light. Attempted fundoscopic exam could not visualize due to small pupils. Jill Alexanders fields are full to threat. Extraocular movements are intact. Trigeminal sensation is intact and the muscles of mastication are normal. The face is symmetric. The palate elevates in the midline. Hearing impaired. Voice is normal. Shoulder shrug is normal. The tongue has normal motion without fasciculations.   Coordination:    Normal finger to nose  Gait:    Slightly bent at waist, good stride, not ataxic or shuffling  Motor Observation:    no involuntary movements noted. Tone:    Normal muscle tone.   Posture:    Slightly bent at waist when walking    Strength:     Strength is symmetrical in the upper and lower limbs, no focal deficits noted     Sensation: intact to LT     Reflex Exam: absent AJs otherwise 1-2+   Toes:    The toes are equiv bilaterally.   Clonus:    Clonus is absent.       Assessment/Plan: 80 year old female who was seen in 2017 for vertigo and aneurysm.  Today she is here for memory loss.  Past medical history of current tobacco abuse, COPD, UTI, cerebral embolism with infarction, heart murmur, Anxiety,  migraines, A. fib, dizziness, 1st degree AV block, syncope and dizziness, aneurysm, orthostatic hypotension.   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 Not sure if she has patience for formal memory testing but we can try. 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  This is an independent risk factor for dementia.  I will send in a prescription for patient for multi B vitamin that will hopefully normalize homocystine.  If patient agrees, let me know I will call in the prescription.  We will recheck homocysteine and next appointment.)  Orders Placed This Encounter  Procedures   NM PET Metabolic Brain   S85 and Folate Panel   Methylmalonic acid, serum   Vitamin B1   Ammonia   Homocysteine   Basic Metabolic Panel   TSH   Ambulatory referral to Neuropsychology   Naomie Dean, MD  [facility] [address] [place], [place] [address]  Phone [phone] Fax [phone]

## 2020-10-01 ENCOUNTER — Encounter: Payer: Self-pay | Admitting: Neurology

## 2020-10-01 ENCOUNTER — Ambulatory Visit: Payer: Medicare Other | Admitting: Neurology

## 2020-10-01 VITALS — BP 185/86 | HR 61 | Ht 63.0 in | Wt 93.0 lb

## 2020-10-01 DIAGNOSIS — F0391 Unspecified dementia with behavioral disturbance: Secondary | ICD-10-CM | POA: Diagnosis not present

## 2020-10-01 DIAGNOSIS — G309 Alzheimer's disease, unspecified: Secondary | ICD-10-CM

## 2020-10-01 NOTE — Patient Instructions (Addendum)
Blood work today PET Scan of the brain(see below, we will call you) which can help [place] diagnose dementia  Formal memory testing(You will be called to schedule)   Dementia Dementia is a condition that affects the way the brain functions. It often affects memory and thinking. Usually, dementia gets worse with time and cannot be reversed (progressive dementia). There are many types of dementia, including:  Alzheimer's disease. This type is the most common.  Vascular dementia. This type may happen as the result of a stroke.  Lewy body dementia. This type may happen to people who have Parkinson's disease.  Frontotemporal dementia. This type is caused by damage to nerve cells (neurons) in certain parts of the brain. Some people may be affected by more than one type of dementia. This is called mixed dementia. What are the causes? Dementia is caused by damage to cells in the brain. The area of the brain and the types of cells damaged determine the type of dementia. Usually, this damage is irreversible or cannot be undone. Some examples of irreversible causes include:  Conditions that affect the blood vessels of the brain, such as diabetes, heart disease, or blood vessel disease.  Genetic mutations. In some cases, changes in the brain may be caused by another condition and can be reversed or slowed. Some examples of reversible causes include:  Injury to the brain.  Certain medicines.  Infection, such as meningitis.  Metabolic problems, such as vitamin B12 deficiency or thyroid disease.  Pressure on the brain, such as from a tumor, blood clot, or too much fluid in the brain (hydrocephalus).  Autoimmune diseases that affect the brain or arteries, such as limbic encephalitis or vasculitis. What are the signs or symptoms? Symptoms of dementia depend on the type of dementia. Common signs of dementia include problems with remembering, thinking, problem solving, decision making, and communicating.  These signs develop slowly or get worse with time. This may include:  Problems remembering events or people.  Having trouble taking a bath or putting clothes on.  Forgetting appointments or forgetting to pay bills.  Difficulty planning and preparing meals.  Having trouble speaking.  Getting lost easily.  Changes in behavior or mood. How is this diagnosed? This condition is diagnosed by a specialist (neurologist). It is diagnosed based on the history of your symptoms, your medical history, a physical exam, and tests. Tests may include:  Tests to evaluate brain function, such as memory tests, cognitive tests, and other tests.  Lab tests, such as blood or urine tests.  Imaging tests, such as a CT scan, a PET scan, or an MRI.  Genetic testing. This may be done if other family members have a diagnosis of certain types of dementia. Your health care provider will talk with you and your family, friends, or caregivers about your history and symptoms.   How is this treated? Treatment for this condition depends on the cause of the dementia. Progressive dementias, such as Alzheimer's disease, cannot be cured, but there may be treatments that help to manage symptoms. Treatment might involve taking medicines that may help to:  Control the dementia.  Slow down the progression of the dementia.  Manage symptoms. In some cases, treating the cause of your dementia can improve symptoms, reverse symptoms, or slow down how quickly your dementia becomes worse. Your health care provider can direct you to support groups, organizations, and other health care providers who can help with decisions about your care. Follow these instructions at home: Medicines  Take over-the-counter and prescription medicines only as told by your health care provider.  Use a pill organizer or pill reminder to help you manage your medicines.  Avoid taking medicines that can affect thinking, such as pain medicines or  sleeping medicines. Lifestyle  Make healthy lifestyle choices. ? Be physically active as told by your health care provider. ? Do not use any products that contain nicotine or tobacco, such as cigarettes, e-cigarettes, and chewing tobacco. If you need help quitting, ask your health care provider. ? Do not drink alcohol. ? Practice stress-management techniques when you get stressed. ? Spend time with other people.  Make sure to get quality sleep. These tips can help you get a good night's rest: ? Avoid napping during the day. ? Keep your sleeping area dark and cool. ? Avoid exercising during the few hours before you go to bed. ? Avoid caffeine products in the evening. Eating and drinking  Drink enough fluid to keep your urine pale yellow.  Eat a healthy diet. General instructions  Work with your health care provider to determine what you need help with and what your safety needs are.  Talk with your health care provider about whether it is safe for you to drive.  If you were given a bracelet that identifies you as a person with memory loss or tracks your location, make sure to wear it at all times.  Work with your family to make important decisions, such as advance directives, medical power of attorney, or a living will.  Keep all follow-up visits. This is important.   Where to find more information  Alzheimer's Association: LimitLaws.hu  [employer] on Aging: CashCowGambling.be  World Health Organization: https://castaneda-walker.com/ Contact a health care provider if:  You have any new or worsening symptoms.  You have problems with choking or swallowing. Get help right away if:  You feel depressed or sad, or feel that you want to harm yourself.  Your family members become concerned for your safety. If you ever feel like you may hurt yourself or others, or have thoughts about taking your own life, get help right away. Go to your nearest emergency department or:  Call your  local emergency services (911 in the [place]  Call a suicide crisis helpline, such as the National Suicide Prevention Lifeline at (367) 585-0649. This is open 24 hours a day in the [place]  Text the Crisis Text Line at 859 706 1957 (in the [place] Summary  Dementia is a condition that affects the way the brain functions. Dementia often affects memory and thinking.  Usually, dementia gets worse with time and cannot be reversed (progressive dementia).  Treatment for this condition depends on the cause of the dementia.  Work with your health care provider to determine what you need help with and what your safety needs are.  Your health care provider can direct you to support groups, organizations, and other health care providers who can help with decisions about your care. This information is not intended to replace advice given to you by your health care provider. Make sure you discuss any questions you have with your health care provider. Document Revised: 12/25/2019 Document Reviewed: 12/25/2019 Elsevier Patient Education  2021 [employer].

## 2020-10-04 ENCOUNTER — Encounter: Payer: Self-pay | Admitting: Counselor

## 2020-10-07 ENCOUNTER — Other Ambulatory Visit: Payer: Self-pay | Admitting: Neurology

## 2020-10-07 ENCOUNTER — Telehealth: Payer: Self-pay | Admitting: *Deleted

## 2020-10-07 DIAGNOSIS — E538 Deficiency of other specified B group vitamins: Secondary | ICD-10-CM

## 2020-10-07 MED ORDER — FOLTX 1.13-25-2 MG PO TABS: 1.0000 | ORAL_TABLET | Freq: Every day | ORAL | 11 refills | Status: DC

## 2020-10-07 NOTE — Telephone Encounter (Signed)
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  This is an independent risk factor for dementia.  I will send in a prescription for patient for multi B vitamin that will hopefully normalize homocystine.  If patient agrees, let me know I will call in the prescription.  We will recheck homocysteine and next appointment.

## 2020-10-07 NOTE — Telephone Encounter (Signed)
Spoke with pt's daughter [name] (on [place]) and discussed lab results per Dr Lucia Gaskins. She verbalized understanding that homocysteine is elevated and is aware that Dr Lucia Gaskins would like to call in a multi B vitamin that will hopefully normalize the homocysteine. Will recheck at next appt in June. [name] was in agreement and verbalized appreciation.

## 2020-10-09 NOTE — Telephone Encounter (Signed)
Received fax from [facility] stating Barbara Hill is not covered by the pt's insurance. I spoke with Dr Lucia Gaskins and she has advised to have patient purchase an OTC multi B vitamin instead, take daily. I called the pt but her VM memory was full. Called daughter [name] (on [place]) and her VM was full as well. Will try again later.

## 2020-10-09 NOTE — Addendum Note (Signed)
Addended by: Bertram Savin on: 10/09/2020 04:50 PM   Modules accepted: Orders

## 2020-10-09 NOTE — Telephone Encounter (Signed)
Spoke with patient and let her know the Fabio Pierce is not approved by insurance. Dr Lucia Gaskins advises her to purchase an OTC multi B vitamin to take daily. Rhonda verbalized understanding and stated she would purchase this for patient tomorrow.

## 2020-10-11 ENCOUNTER — Encounter: Payer: Self-pay | Admitting: Counselor

## 2020-10-14 ENCOUNTER — Encounter: Payer: Medicare HMO | Admitting: Counselor

## 2020-10-15 ENCOUNTER — Encounter: Payer: Self-pay | Admitting: Neurology

## 2020-10-15 LAB — BASIC METABOLIC PANEL WITH GFR
BUN/Creatinine Ratio: 13 (ref 12–28)
BUN: 14 mg/dL (ref 8–27)
CO2: 23 mmol/L (ref 20–29)
Calcium: 9.2 mg/dL (ref 8.7–10.3)
Chloride: 104 mmol/L (ref 96–106)
Creatinine, Ser: 1.05 mg/dL — ABNORMAL HIGH (ref 0.57–1.00)
GFR calc Af Amer: 59 mL/min/1.73 — ABNORMAL LOW
GFR calc non Af Amer: 51 mL/min/1.73 — ABNORMAL LOW
Glucose: 83 mg/dL (ref 65–99)
Potassium: 4.1 mmol/L (ref 3.5–5.2)
Sodium: 142 mmol/L (ref 134–144)

## 2020-10-15 LAB — AMMONIA: Ammonia: 44 ug/dL (ref 31–169)

## 2020-10-15 LAB — VITAMIN B1: Thiamine: 70.9 nmol/L (ref 66.5–200.0)

## 2020-10-15 LAB — B12 AND FOLATE PANEL
Folate: 3.2 ng/mL (ref >3.0)
Vitamin B-12: 639 pg/mL (ref 232–1245)

## 2020-10-15 LAB — HOMOCYSTEINE: Homocysteine: 27.1 umol/L — ABNORMAL HIGH (ref 0.0–19.2)

## 2020-10-15 LAB — TSH: TSH: 1.07 u[IU]/mL (ref 0.450–4.500)

## 2020-10-15 LAB — METHYLMALONIC ACID, SERUM: Methylmalonic Acid: 283 nmol/L (ref 0–378)

## 2020-10-21 ENCOUNTER — Encounter: Payer: Medicare HMO | Admitting: Counselor

## 2020-11-11 ENCOUNTER — Telehealth: Payer: Self-pay | Admitting: Neurology

## 2020-11-11 NOTE — Telephone Encounter (Signed)
Pt's daughter, Matthew Saras ( on [place]) called my mother is seeing people that are not living, she thinks this in not her home. Can she been worked in? Would like a call from the nurse to discuss a sooner appt.

## 2020-11-11 NOTE — Telephone Encounter (Signed)
Spoke with the patient's daughter Barbara Hill (on [place]).  She stated the patient did see primary care last week but no blood work or other testing was done at that time.  The hallucinations are about the same but maybe have gotten a little worse since then.  I did advise her that I had spoken with Dr. Lucia Gaskins we recommend to have the pt checked out by primary care to be sure there is no urinary tract infection or other medical abnormalities that could be causing this change in the hallucinations.  The daughter verbalized understanding and says she will call primary care first thing in the morning.  I asked her to give [place] a call after the testing has been completed and at that time if there is no cause of this change we can schedule an appointment to discuss potential medications to help with the hallucinations.  She verbalized understanding and appreciation for the call.  Per Dr Lucia Gaskins, rule out infection etc. then can discuss Seroquel.

## 2020-11-18 ENCOUNTER — Other Ambulatory Visit: Payer: Self-pay

## 2020-11-18 ENCOUNTER — Emergency Department (HOSPITAL_BASED_OUTPATIENT_CLINIC_OR_DEPARTMENT_OTHER): Payer: Medicare HMO

## 2020-11-18 ENCOUNTER — Encounter (HOSPITAL_BASED_OUTPATIENT_CLINIC_OR_DEPARTMENT_OTHER): Payer: Self-pay

## 2020-11-18 ENCOUNTER — Inpatient Hospital Stay (HOSPITAL_BASED_OUTPATIENT_CLINIC_OR_DEPARTMENT_OTHER)
Admission: EM | Admit: 2020-11-18 | Discharge: 2020-11-22 | DRG: 194 | Disposition: A | Discharge status: Home Health | Payer: Medicare HMO | Attending: Internal Medicine | Admitting: Internal Medicine

## 2020-11-18 DIAGNOSIS — D631 Anemia in chronic kidney disease: Secondary | ICD-10-CM | POA: Diagnosis present

## 2020-11-18 DIAGNOSIS — N1831 Chronic kidney disease, stage 3a: Secondary | ICD-10-CM | POA: Diagnosis present

## 2020-11-18 DIAGNOSIS — I129 Hypertensive chronic kidney disease with stage 1 through stage 4 chronic kidney disease, or unspecified chronic kidney disease: Secondary | ICD-10-CM | POA: Diagnosis present

## 2020-11-18 DIAGNOSIS — Z888 Allergy status to other drugs, medicaments and biological substances status: Secondary | ICD-10-CM

## 2020-11-18 DIAGNOSIS — Z880 Allergy status to penicillin: Secondary | ICD-10-CM

## 2020-11-18 DIAGNOSIS — I471 Supraventricular tachycardia: Secondary | ICD-10-CM

## 2020-11-18 DIAGNOSIS — J440 Chronic obstructive pulmonary disease with acute lower respiratory infection: Secondary | ICD-10-CM | POA: Diagnosis not present

## 2020-11-18 DIAGNOSIS — F419 Anxiety disorder, unspecified: Secondary | ICD-10-CM | POA: Diagnosis present

## 2020-11-18 DIAGNOSIS — Z825 Family history of asthma and other chronic lower respiratory diseases: Secondary | ICD-10-CM | POA: Diagnosis not present

## 2020-11-18 DIAGNOSIS — Z9049 Acquired absence of other specified parts of digestive tract: Secondary | ICD-10-CM

## 2020-11-18 DIAGNOSIS — F0390 Unspecified dementia without behavioral disturbance: Secondary | ICD-10-CM | POA: Diagnosis not present

## 2020-11-18 DIAGNOSIS — Z8262 Family history of osteoporosis: Secondary | ICD-10-CM | POA: Diagnosis not present

## 2020-11-18 DIAGNOSIS — Z20822 Contact with and (suspected) exposure to covid-19: Secondary | ICD-10-CM | POA: Diagnosis present

## 2020-11-18 DIAGNOSIS — J189 Pneumonia, unspecified organism: Secondary | ICD-10-CM | POA: Diagnosis present

## 2020-11-18 DIAGNOSIS — E876 Hypokalemia: Secondary | ICD-10-CM | POA: Diagnosis not present

## 2020-11-18 DIAGNOSIS — Z881 Allergy status to other antibiotic agents status: Secondary | ICD-10-CM

## 2020-11-18 DIAGNOSIS — Z7901 Long term (current) use of anticoagulants: Secondary | ICD-10-CM | POA: Diagnosis not present

## 2020-11-18 DIAGNOSIS — Z8249 Family history of ischemic heart disease and other diseases of the circulatory system: Secondary | ICD-10-CM | POA: Diagnosis not present

## 2020-11-18 DIAGNOSIS — F17210 Nicotine dependence, cigarettes, uncomplicated: Secondary | ICD-10-CM | POA: Diagnosis present

## 2020-11-18 DIAGNOSIS — J449 Chronic obstructive pulmonary disease, unspecified: Secondary | ICD-10-CM | POA: Diagnosis present

## 2020-11-18 DIAGNOSIS — Z90710 Acquired absence of both cervix and uterus: Secondary | ICD-10-CM

## 2020-11-18 DIAGNOSIS — I341 Nonrheumatic mitral (valve) prolapse: Secondary | ICD-10-CM | POA: Diagnosis not present

## 2020-11-18 DIAGNOSIS — Z8673 Personal history of transient ischemic attack (TIA), and cerebral infarction without residual deficits: Secondary | ICD-10-CM

## 2020-11-18 DIAGNOSIS — Z7902 Long term (current) use of antithrombotics/antiplatelets: Secondary | ICD-10-CM

## 2020-11-18 DIAGNOSIS — Z79899 Other long term (current) drug therapy: Secondary | ICD-10-CM

## 2020-11-18 DIAGNOSIS — N183 Chronic kidney disease, stage 3 unspecified: Secondary | ICD-10-CM | POA: Diagnosis present

## 2020-11-18 DIAGNOSIS — F32A Depression, unspecified: Secondary | ICD-10-CM | POA: Diagnosis present

## 2020-11-18 DIAGNOSIS — A419 Sepsis, unspecified organism: Secondary | ICD-10-CM | POA: Diagnosis present

## 2020-11-18 DIAGNOSIS — I10 Essential (primary) hypertension: Secondary | ICD-10-CM

## 2020-11-18 LAB — CBC WITH DIFFERENTIAL/PLATELET
Abs Immature Granulocytes: 0.08 10*3/uL — ABNORMAL HIGH (ref 0.00–0.07)
Basophils Absolute: 0 10*3/uL (ref 0.0–0.1)
Basophils Relative: 0 %
Eosinophils Absolute: 0.1 10*3/uL (ref 0.0–0.5)
Eosinophils Relative: 1 %
HCT: 37.1 % (ref 36.0–46.0)
Hemoglobin: 12.5 g/dL (ref 12.0–15.0)
Immature Granulocytes: 1 %
Lymphocytes Relative: 12 %
Lymphs Abs: 1.6 10*3/uL (ref 0.7–4.0)
MCH: 30.5 pg (ref 26.0–34.0)
MCHC: 33.7 g/dL (ref 30.0–36.0)
MCV: 90.5 fL (ref 80.0–100.0)
Monocytes Absolute: 0.9 10*3/uL (ref 0.1–1.0)
Monocytes Relative: 6 %
Neutro Abs: 11.1 10*3/uL — ABNORMAL HIGH (ref 1.7–7.7)
Neutrophils Relative %: 80 %
Platelets: 487 10*3/uL — ABNORMAL HIGH (ref 150–400)
RBC: 4.1 MIL/uL (ref 3.87–5.11)
RDW: 12.8 % (ref 11.5–15.5)
WBC: 13.9 10*3/uL — ABNORMAL HIGH (ref 4.0–10.5)
nRBC: 0 % (ref 0.0–0.2)

## 2020-11-18 LAB — COMPREHENSIVE METABOLIC PANEL
ALT: 15 U/L (ref 0–44)
AST: 20 U/L (ref 15–41)
Albumin: 2.7 g/dL — ABNORMAL LOW (ref 3.5–5.0)
Alkaline Phosphatase: 100 U/L (ref 38–126)
Anion gap: 12 (ref 5–15)
BUN: 16 mg/dL (ref 8–23)
CO2: 27 mmol/L (ref 22–32)
Calcium: 9.1 mg/dL (ref 8.9–10.3)
Chloride: 97 mmol/L — ABNORMAL LOW (ref 98–111)
Creatinine, Ser: 1.06 mg/dL — ABNORMAL HIGH (ref 0.44–1.00)
GFR, Estimated: 54 mL/min — ABNORMAL LOW (ref >60)
Glucose, Bld: 102 mg/dL — ABNORMAL HIGH (ref 70–99)
Potassium: 3.2 mmol/L — ABNORMAL LOW (ref 3.5–5.1)
Sodium: 136 mmol/L (ref 135–145)
Total Bilirubin: 0.3 mg/dL (ref 0.3–1.2)
Total Protein: 8.1 g/dL (ref 6.5–8.1)

## 2020-11-18 LAB — URINALYSIS, ROUTINE W REFLEX MICROSCOPIC
Bilirubin Urine: NEGATIVE
Glucose, UA: NEGATIVE mg/dL
Ketones, ur: NEGATIVE mg/dL
Leukocytes,Ua: NEGATIVE
Nitrite: NEGATIVE
Protein, ur: NEGATIVE mg/dL
Specific Gravity, Urine: 1.025 (ref 1.005–1.030)
pH: 5 (ref 5.0–8.0)

## 2020-11-18 LAB — MAGNESIUM: Magnesium: 1.2 mg/dL — ABNORMAL LOW (ref 1.7–2.4)

## 2020-11-18 LAB — URINALYSIS, MICROSCOPIC (REFLEX)

## 2020-11-18 LAB — LACTIC ACID, PLASMA: Lactic Acid, Venous: 1 mmol/L (ref 0.5–1.9)

## 2020-11-18 LAB — RESP PANEL BY RT-PCR (FLU A&B, COVID) ARPGX2
Influenza A by PCR: NEGATIVE
Influenza B by PCR: NEGATIVE
SARS Coronavirus 2 by RT PCR: NEGATIVE

## 2020-11-18 MED ORDER — SODIUM CHLORIDE 0.9 % IV SOLN
500.0000 mg | Freq: Once | INTRAVENOUS | Status: AC
  Administered 2020-11-18: 500 mg via INTRAVENOUS
  Filled 2020-11-18: qty 500

## 2020-11-18 MED ORDER — SODIUM CHLORIDE 0.9 % IV SOLN
INTRAVENOUS | Status: DC | PRN
  Administered 2020-11-18: 100 mL via INTRAVENOUS
  Administered 2020-11-19: 1000 mL via INTRAVENOUS

## 2020-11-18 MED ORDER — SODIUM CHLORIDE 0.9 % IV BOLUS
1000.0000 mL | Freq: Once | INTRAVENOUS | Status: AC
  Administered 2020-11-18: 1000 mL via INTRAVENOUS

## 2020-11-18 MED ORDER — POLYETHYLENE GLYCOL 3350 17 G PO PACK: 17.0000 g | PACK | Freq: Every day | ORAL | Status: DC | PRN

## 2020-11-18 MED ORDER — CLOPIDOGREL BISULFATE 75 MG PO TABS
75.0000 mg | ORAL_TABLET | Freq: Every day | ORAL | Status: DC
  Administered 2020-11-19 – 2020-11-22 (×4): 75 mg via ORAL
  Filled 2020-11-18 (×4): qty 1

## 2020-11-18 MED ORDER — ACETAMINOPHEN 650 MG RE SUPP: 650.0000 mg | Freq: Four times a day (QID) | RECTAL | Status: DC | PRN

## 2020-11-18 MED ORDER — POTASSIUM CHLORIDE CRYS ER 20 MEQ PO TBCR
40.0000 meq | EXTENDED_RELEASE_TABLET | Freq: Once | ORAL | Status: AC
  Administered 2020-11-19: 40 meq via ORAL
  Filled 2020-11-18: qty 2

## 2020-11-18 MED ORDER — LOSARTAN POTASSIUM 50 MG PO TABS
25.0000 mg | ORAL_TABLET | Freq: Every day | ORAL | Status: DC
  Administered 2020-11-19: 25 mg via ORAL
  Filled 2020-11-18: qty 1

## 2020-11-18 MED ORDER — BUTALBITAL-APAP-CAFFEINE 50-325-40 MG PO TABS
1.0000 | ORAL_TABLET | Freq: Three times a day (TID) | ORAL | Status: DC | PRN
  Administered 2020-11-20 – 2020-11-21 (×5): 1 via ORAL
  Filled 2020-11-18 (×7): qty 1

## 2020-11-18 MED ORDER — FLUOXETINE HCL 20 MG PO CAPS
20.0000 mg | ORAL_CAPSULE | Freq: Two times a day (BID) | ORAL | Status: DC
  Administered 2020-11-19 – 2020-11-22 (×8): 20 mg via ORAL
  Filled 2020-11-18 (×8): qty 1

## 2020-11-18 MED ORDER — SODIUM CHLORIDE 0.9% FLUSH
3.0000 mL | Freq: Two times a day (BID) | INTRAVENOUS | Status: DC
  Administered 2020-11-19 – 2020-11-22 (×3): 3 mL via INTRAVENOUS

## 2020-11-18 MED ORDER — ACETAMINOPHEN 325 MG PO TABS
650.0000 mg | ORAL_TABLET | Freq: Four times a day (QID) | ORAL | Status: DC | PRN
  Administered 2020-11-19 – 2020-11-21 (×5): 650 mg via ORAL
  Filled 2020-11-18 (×5): qty 2

## 2020-11-18 MED ORDER — MIRTAZAPINE 15 MG PO TABS
7.5000 mg | ORAL_TABLET | Freq: Every day | ORAL | Status: DC
  Administered 2020-11-19 – 2020-11-21 (×4): 7.5 mg via ORAL
  Filled 2020-11-18 (×4): qty 1

## 2020-11-18 MED ORDER — FLECAINIDE ACETATE 50 MG PO TABS
50.0000 mg | ORAL_TABLET | Freq: Two times a day (BID) | ORAL | Status: DC
  Administered 2020-11-19 – 2020-11-22 (×7): 50 mg via ORAL
  Filled 2020-11-18 (×9): qty 1

## 2020-11-18 MED ORDER — AMLODIPINE BESYLATE 5 MG PO TABS
5.0000 mg | ORAL_TABLET | Freq: Every day | ORAL | Status: DC
  Administered 2020-11-19: 5 mg via ORAL
  Filled 2020-11-18: qty 1

## 2020-11-18 MED ORDER — ENSURE ENLIVE PO LIQD
237.0000 mL | Freq: Two times a day (BID) | ORAL | Status: DC
  Administered 2020-11-19 – 2020-11-22 (×8): 237 mL via ORAL

## 2020-11-18 MED ORDER — QUETIAPINE FUMARATE 25 MG PO TABS: 25.0000 mg | ORAL_TABLET | Freq: Two times a day (BID) | ORAL | 5 refills | Status: DC

## 2020-11-18 MED ORDER — PANTOPRAZOLE SODIUM 40 MG PO TBEC
40.0000 mg | DELAYED_RELEASE_TABLET | Freq: Every day | ORAL | Status: DC
  Administered 2020-11-19 – 2020-11-22 (×4): 40 mg via ORAL
  Filled 2020-11-18 (×4): qty 1

## 2020-11-18 MED ORDER — ALPRAZOLAM 0.25 MG PO TABS
0.2500 mg | ORAL_TABLET | Freq: Three times a day (TID) | ORAL | Status: DC | PRN
  Administered 2020-11-20: 0.5 mg via ORAL
  Administered 2020-11-20: 0.25 mg via ORAL
  Administered 2020-11-21: 0.5 mg via ORAL
  Filled 2020-11-18 (×3): qty 2
  Filled 2020-11-18: qty 1

## 2020-11-18 MED ORDER — SODIUM CHLORIDE 0.9 % IV SOLN
1.0000 g | Freq: Once | INTRAVENOUS | Status: AC
  Administered 2020-11-18: 1 g via INTRAVENOUS
  Filled 2020-11-18: qty 10

## 2020-11-18 MED ORDER — METOPROLOL SUCCINATE ER 25 MG PO TB24
25.0000 mg | ORAL_TABLET | Freq: Every day | ORAL | Status: DC
  Administered 2020-11-19 – 2020-11-21 (×4): 25 mg via ORAL
  Filled 2020-11-18 (×4): qty 1

## 2020-11-18 MED ORDER — SODIUM CHLORIDE 0.9 % IV SOLN
1.0000 g | INTRAVENOUS | Status: DC
  Administered 2020-11-19 – 2020-11-21 (×3): 1 g via INTRAVENOUS
  Filled 2020-11-18 (×3): qty 1

## 2020-11-18 MED ORDER — SODIUM CHLORIDE 0.9 % IV SOLN
500.0000 mg | INTRAVENOUS | Status: DC
  Administered 2020-11-19 – 2020-11-20 (×2): 500 mg via INTRAVENOUS
  Filled 2020-11-18 (×2): qty 500

## 2020-11-18 NOTE — Telephone Encounter (Signed)
Pt's daughter has called back to inform that pt's labs came back and pt does not have a UTI, she would like a call to discuss what can be done next for pt.

## 2020-11-18 NOTE — ED Notes (Signed)
Pamelia Hoit (daughter) phone number [phone]. Please call with any updates or concerns.

## 2020-11-18 NOTE — ED Provider Notes (Signed)
[facility] EMERGENCY DEPARTMENT Provider Note   CSN: [id number] Arrival date & time: 11/18/20  1303     History Chief Complaint  Patient presents with  . Weakness    Barbara Hill is a 80 y.o. female.  HPI Patient is a 80 year old female with a history of hypertension, MVP, COPD, who presents the emergency department due to weakness.  Her daughter is at bedside and provides most of the history.  Her daughter states that she was recently diagnosed with dementia.  She states that her mother's been complaining of dysuria for the past few days.  Since this started, she began experiencing increased weakness, confusion, and does not want to get out of bed as often during the day.  Patient reports dysuria as well but has no other complaints.  Her daughter does note that she is a smoker and typically smokes about 1/4 pack/day.  She was started on an antibiotic last week and finished it yesterday due to a worsening productive cough for the past few weeks.  She states her mother has still continued to cough since taking this antibiotic.  She also notes she experienced fevers the past two days with a TMAX of 101F. No other complaints at this time.  Level 5 caveat due to dementia.    Past Medical History:  Diagnosis Date  . Anxiety   . Brain aneurysm    pipeline stent 2017   . COPD (chronic obstructive pulmonary disease) (HCC)   . Fatigue   . H/O: hysterectomy   . Headache(784.0)   . History of cardiac catheterization    LHC 10/02: Normal coronary arteries  . History of nuclear stress test    Myoview 10/12: Normal stress nuclear study.  . Hypertension   . Mitral valve prolapse    Echo 8/17:Mild LVH, EF 60-65, normal wall motion, grade 2 diastolic dysfunction, mild AI, mild MR, mild LAE // echo 11/01: EF 60%, mild anterior MVP with trivial MR, PASP 35-40 mmHg, LAE  . Orthostasis   . PSVT (paroxysmal supraventricular tachycardia) (HCC)    palps  . Radiculopathy     Patient  Active Problem List   Diagnosis Date Noted  . TIA (transient ischemic attack) 09/17/2020  . Protein-calorie malnutrition, severe 02/05/2020  . Ischemic leg 01/25/2020  . COPD (chronic obstructive pulmonary disease) (HCC) 01/25/2020  . Normocytic anemia 01/25/2020  . History of CVA (cerebrovascular accident) 01/25/2020  . Anxiety 01/25/2020  . CKD (chronic kidney disease), stage III (HCC) 01/25/2020  . 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 Brain aneurysm 06/29/2016  . Lumbar contusion   . Orthostatic hypotension   . First degree AV block   . Hypokalemia   . Syncope 04/10/2016  . Paroxysmal SVT (supraventricular tachycardia) (HCC) 04/21/2011  . 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 Tobacco use disorder 04/21/2011    Past Surgical History:  Procedure Laterality Date  . ABDOMINAL HYSTERECTOMY    . APPENDECTOMY    . BUBBLE STUDY  01/29/2020   Procedure: BUBBLE STUDY;  Surgeon: Parke Poisson, MD;  Location: [facility] ENDOSCOPY;  Service: Cardiology;;  . CARDIAC CATHETERIZATION  12/95  . CATARACT EXTRACTION    . CHOLECYSTECTOMY    . EMBOLECTOMY Bilateral 01/25/2020   Procedure: Bilateral popliteal EMBOLECTOMY.;  Surgeon: [name], MD;  Location: [facility] OR;  Service: Vascular;  Laterality: Bilateral;  . FASCIOTOMY Bilateral 01/26/2020   Procedure: FASCIOTOMY;  Surgeon: [name], MD;  Location: [facility] OR;  Service: Vascular;  Laterality: Bilateral;  .  IR GENERIC HISTORICAL  06/02/2016   IR RADIOLOGIST EVAL & MGMT 06/02/2016 [facility]  . IR GENERIC HISTORICAL  06/29/2016   IR ANGIO INTRA EXTRACRAN SEL INTERNAL CAROTID UNI R MOD SED 06/29/2016 [name], MD [facility]  . IR GENERIC HISTORICAL  06/29/2016   IR ANGIOGRAM FOLLOW UP STUDY 06/29/2016 [name], MD [facility]  . IR GENERIC HISTORICAL  06/29/2016   IR NEURO EACH ADD'L AFTER BASIC UNI RIGHT (MS) 06/29/2016 [name], MD [facility]  . IR GENERIC HISTORICAL  06/29/2016   IR 3D  INDEPENDENT WKST 06/29/2016 [name], MD [facility]  . IR GENERIC HISTORICAL  06/29/2016   IR TRANSCATH/EMBOLIZ 06/29/2016 [name], MD [facility]  . IR GENERIC HISTORICAL  06/29/2016   IR ANGIO VERTEBRAL SEL SUBCLAVIAN INNOMINATE UNI R MOD SED 06/29/2016 [name], MD [facility]  . IR GENERIC HISTORICAL  07/28/2016   IR RADIOLOGIST EVAL & MGMT 07/28/2016 [facility]  . IR GENERIC HISTORICAL  11/12/2016   IR ANGIO INTRA EXTRACRAN SEL COM CAROTID INNOMINATE BILAT MOD SED 11/12/2016 [name], MD [facility]  . IR GENERIC HISTORICAL  11/12/2016   IR ANGIO VERTEBRAL SEL VERTEBRAL UNI L MOD SED 11/12/2016 [name], MD [facility]  . IR GENERIC HISTORICAL  11/12/2016   IR ANGIO VERTEBRAL SEL SUBCLAVIAN INNOMINATE UNI R MOD SED 11/12/2016 [name], MD [facility]  . NASAL SEPTUM SURGERY    . RADIOLOGY WITH ANESTHESIA N/A 06/29/2016   Procedure: EMBOLIZATION;  Surgeon: [name], MD;  Location: [facility] OR;  Service: Radiology;  Laterality: N/A;  . ROTATOR CUFF REPAIR    . TEE WITHOUT CARDIOVERSION N/A 01/29/2020   Procedure: TRANSESOPHAGEAL ECHOCARDIOGRAM (TEE);  Surgeon: Parke Poisson, MD;  Location: [facility] ENDOSCOPY;  Service: Cardiology;  Laterality: N/A;     OB History   No obstetric history on file.     Family History  Problem Relation Age of Onset  . Heart attack Mother   . Transient ischemic attack Mother   . Dementia Mother        "at the end"  . Heart attack Father 87  . Dementia Brother   . Heart attack Other   . Asthma Other   . Heart failure Other   . Osteoporosis Other   . Heart Problems Brother   . CAD Other     Social History   Tobacco Use  . Smoking status: Current Every Day Smoker    Packs/day: 1.00    Years: 50.00    Pack years: 50.00    Types: Cigarettes  . Smokeless tobacco: Never Used  . Tobacco comment: now about 0.5 ppd   Vaping Use  . Vaping Use: Never used  Substance Use Topics  . Alcohol  use: No  . Drug use: No    Home Medications Prior to Admission medications   Medication Sig Start Date End Date Taking? Authorizing Provider  acetaminophen (TYLENOL) 650 MG CR tablet Take 650 mg by mouth every 8 (eight) hours as needed for pain.    [provider]  ALPRAZolam [name]) 0.5 MG tablet Take 1 tablet (0.5 mg total) by mouth 3 (three) times daily as needed for anxiety. Patient taking differently: Take 0.25-0.5 mg by mouth 3 (three) times daily as needed for anxiety. 02/09/20   Almon Hercules, MD  amLODipine (NORVASC) 5 MG tablet Take 1 tablet (5 mg total) by mouth daily. 09/21/20   Azucena Fallen, MD  apixaban (ELIQUIS) 5 MG TABS  tablet Take 1 tablet (5 mg total) by mouth 2 (two) times daily. 02/09/20   Almon HerculesGonfa, Taye T, MD  B Complex Vitamins (B COMPLEX VITAMIN PO) Take by mouth.    [provider]  butalbital-acetaminophen-caffeine (FIORICET) 50-325-40 MG tablet Take 1 tablet by mouth every 8 (eight) hours as needed for headache or migraine. 02/09/20   Almon HerculesGonfa, Taye T, MD  cholecalciferol (VITAMIN D) 1000 units tablet Take 1,000 Units by mouth daily.    [provider]  clopidogrel (PLAVIX) 75 MG tablet Take 1 tablet (75 mg total) by mouth daily. 09/24/20   Louk, Waylan BogaAlexandra M, PA-C  famotidine (PEPCID) 20 MG tablet Take 20 mg by mouth at bedtime as needed for heartburn. 07/16/20   [provider]  feeding supplement, ENSURE ENLIVE, (ENSURE ENLIVE) LIQD Take 237 mLs by mouth 2 (two) times daily between meals. 02/09/20   Almon HerculesGonfa, Taye T, MD  ferrous sulfate 325 (65 FE) MG tablet Take 1 tablet (325 mg total) by mouth 2 (two) times daily with a meal. Patient not taking: Reported on 09/18/2020 02/09/20 08/07/20  Almon HerculesGonfa, Taye T, MD  flecainide (TAMBOCOR) 50 MG tablet Take 1 tablet (50 mg total) by mouth 2 (two) times daily. Patient taking differently: Take 75 mg by mouth 2 (two) times daily. 07/29/20   Pricilla Riffleoss, Paula V, MD  FLUoxetine (PROZAC) 20 MG capsule Take 20 mg  by mouth 2 (two) times daily. 06/07/16   [provider]  fluticasone (FLONASE) 50 MCG/ACT nasal spray Place 1 spray into both nostrils daily as needed for allergies or rhinitis.  10/11/14   [provider]  losartan (COZAAR) 25 MG tablet Take 25 mg by mouth daily.    [provider]  metoprolol succinate (TOPROL-XL) 25 MG 24 hr tablet TAKE 1 TABLET BY MOUTH EVERY DAY AT BEDTIME Patient taking differently: Take 25 mg by mouth at bedtime. 06/20/20   Pricilla Riffleoss, Paula V, MD  mirtazapine (REMERON) 7.5 MG tablet Take 7.5 mg by mouth at bedtime. 08/18/20   [provider]  Multiple Vitamin (MULTIVITAMIN WITH MINERALS) TABS tablet Take 1 tablet by mouth daily. 02/09/20   Almon HerculesGonfa, Taye T, MD  omeprazole (PRILOSEC) 40 MG capsule Take 40 mg by mouth daily.    [provider]  polyethylene glycol powder (MIRALAX) 17 GM/SCOOP powder Take 17 g by mouth 2 (two) times daily as needed for moderate constipation. 02/09/20   Almon HerculesGonfa, Taye T, MD  QUEtiapine (SEROQUEL) 25 MG tablet Take 1 tablet (25 mg total) by mouth 2 (two) times daily. 11/18/20   Anson FretAhern, Antonia B, MD  senna-docusate (SENOKOT-S) 8.6-50 MG tablet Take 1 tablet by mouth 2 (two) times daily between meals as needed for mild constipation. 02/09/20   Almon HerculesGonfa, Taye T, MD  vitamin B-12 1000 MCG tablet Take 1 tablet (1,000 mcg total) by mouth daily. 02/09/20   Almon HerculesGonfa, Taye T, MD    Allergies    Dobutamine, Epinephrine, Aspirin, Clarithromycin, Doxycycline, Excedrin extra strength [aspirin-acetaminophen-caffeine], Penicillins, Prednisone, and Propoxyphene  Review of Systems   Review of Systems  Unable to perform ROS: Dementia   Physical Exam Updated Vital Signs BP (!) 144/62   Pulse 69   Temp 100.3 F (37.9 C) (Rectal)   Resp 14   Ht 5\' 3"  (1.6 m)   Wt 44.5 kg   SpO2 95%   BMI 17.36 kg/m   Physical Exam Vitals and nursing note reviewed.  Constitutional:      General: She is not in acute distress.  Appearance:  Normal appearance. She is not ill-appearing, toxic-appearing or diaphoretic.     Comments: Cachectic elderly female.  Sitting upright.  Answers questions when asked.  Oriented to self.  HENT:     Head: Normocephalic and atraumatic.     Right Ear: External ear normal.     Left Ear: External ear normal.     Nose: Nose normal.     Mouth/Throat:     Mouth: Mucous membranes are moist.     Pharynx: Oropharynx is clear. No oropharyngeal exudate or posterior oropharyngeal erythema.  Eyes:     General: No scleral icterus.       Right eye: No discharge.        Left eye: No discharge.     Extraocular Movements: Extraocular movements intact.     Conjunctiva/sclera: Conjunctivae normal.  Cardiovascular:     Rate and Rhythm: Normal rate and regular rhythm.     Pulses: Normal pulses.     Heart sounds: Normal heart sounds. No murmur heard. No friction rub. No gallop.   Pulmonary:     Effort: Pulmonary effort is normal. No respiratory distress.     Breath sounds: Normal breath sounds. No stridor. No wheezing, rhonchi or rales.     Comments: Poor inspiratory effort.  Lungs are clear to auscultation bilaterally. Abdominal:     General: Abdomen is flat.     Palpations: Abdomen is soft.     Tenderness: There is no abdominal tenderness.     Comments: Abdomen is soft and nontender.  Musculoskeletal:        General: Normal range of motion.     Cervical back: Normal range of motion and neck supple. No tenderness.  Skin:    General: Skin is warm and dry.  Neurological:     General: No focal deficit present.     Mental Status: She is alert.  Psychiatric:        Mood and Affect: Mood normal.        Behavior: Behavior normal.    ED Results / Procedures / Treatments   Labs (all labs ordered are listed, but only abnormal results are displayed) Labs Reviewed  COMPREHENSIVE METABOLIC PANEL - Abnormal; Notable for the following components:      Result Value   Potassium 3.2 (*)    Chloride 97 (*)     Glucose, Bld 102 (*)    Creatinine, Ser 1.06 (*)    Albumin 2.7 (*)    GFR, Estimated 54 (*)    All other components within normal limits  CBC WITH DIFFERENTIAL/PLATELET - Abnormal; Notable for the following components:   WBC 13.9 (*)    Platelets 487 (*)    Neutro Abs 11.1 (*)    Abs Immature Granulocytes 0.08 (*)    All other components within normal limits  URINALYSIS, ROUTINE W REFLEX MICROSCOPIC - Abnormal; Notable for the following components:   Hgb urine dipstick MODERATE (*)    All other components within normal limits  URINALYSIS, MICROSCOPIC (REFLEX) - Abnormal; Notable for the following components:   Bacteria, UA RARE (*)    All other components within normal limits  RESP PANEL BY RT-PCR (FLU A&B, COVID) ARPGX2  CULTURE, BLOOD (ROUTINE X 2)  CULTURE, BLOOD (ROUTINE X 2)  LACTIC ACID, PLASMA    EKG EKG Interpretation  Date/Time:  Monday November 18 2020 13:20:41 EDT Ventricular Rate:  67 PR Interval:    QRS Duration: 100 QT Interval:  467 QTC Calculation: 493 R Axis:   -  127 Text Interpretation: Sinus rhythm Borderline low voltage, extremity leads Probable anteroseptal infarct, old Minimal ST depression, lateral leads Artifact Baseline wander Confirmed by Gwyneth Sprout ([id number]) on 11/18/2020 2:46:34 PM  Radiology DG Chest Portable 1 View  Result Date: 11/18/2020 CLINICAL DATA:  Shortness of breath. EXAM: PORTABLE CHEST 1 VIEW COMPARISON:  September 18, 2020. FINDINGS: The heart size and mediastinal contours are within normal limits. No pneumothorax is noted. Mild bibasilar atelectasis or infiltrates are noted. Small left pleural effusion is noted. The visualized skeletal structures are unremarkable. IMPRESSION: Mild bibasilar atelectasis or infiltrates are noted with small left pleural effusion. Followup PA and lateral chest X-ray is recommended in 3-4 weeks following trial of antibiotic therapy to ensure resolution and exclude underlying malignancy. Electronically Signed    By: Lupita Raider M.D.   On: 11/18/2020 14:35   Procedures Procedures   Medications Ordered in ED Medications  cefTRIAXone (ROCEPHIN) 1 g in sodium chloride 0.9 % 100 mL IVPB (has no administration in time range)  azithromycin (ZITHROMAX) 500 mg in sodium chloride 0.9 % 250 mL IVPB (has no administration in time range)  sodium chloride 0.9 % bolus 1,000 mL (0 mLs Intravenous Stopped 11/18/20 1745)    ED Course  I have reviewed the triage vital signs and the nursing notes.  Pertinent labs & imaging results that were available during my care of the patient were reviewed by me and considered in my medical decision making (see chart for details).  Clinical Course as of 11/18/20 1814  Mon Nov 18, 2020  1533 Temp: 100.3 F (37.9 C) Rectal  [LJ]    Clinical Course User Index [LJ] Placido Sou, PA-C   MDM Rules/Calculators/A&P                          Pt is a 80 y.o. female who presents the emergency department due to weakness, fatigue, cough for the past 3 weeks.  There was concern that she initially had pneumonia and completed a course of antibiotics yesterday.  Labs: CBC with a leukocytosis of 13.9, platelets of 47, neutrophils of 11.1.  Immature granulocytes of 0.08. CMP with a potassium of 3.2, chloride of 97, glucose of 102, creatinine of 1.06, albumin of 2.7, GFR 54. Respiratory panel is negative. UA with moderate hemoglobin and rare bacteria. Lactic acid of 1.  Imaging: Chest x-ray shows mild bibasilar atelectasis or infiltrates which are noted with a small left pleural effusion.  I, Placido Sou, PA-C, personally reviewed and evaluated these images and lab results as part of my medical decision-making.  Both myself as well as my attending physician Dr. Gwyneth Sprout are concerned that patient likely has a persistent community-acquired pneumonia.  She recently completed a course of antibiotics, though daughter is unaware of which medication this was.  She  finished this yesterday.  Temperatures 100.3 F and patient also has a white count of 13.9.  Nearly meets sepsis protocol.  Patient given azithromycin as well as Rocephin for likely pneumonia.  Feel that given her age and general state of health that she will need to be admitted for further work-up.  Will discuss with the medicine team.  Note: Portions of this report may have been transcribed using voice recognition software. Every effort was made to ensure accuracy; however, inadvertent computerized transcription errors may be present.   Final Clinical Impression(s) / ED Diagnoses Final diagnoses:  Community acquired pneumonia, unspecified laterality   Rx / DC Orders ED  Discharge Orders    None       Placido Sou, PA-C 11/18/20 1816    Gwyneth Sprout, MD 11/19/20 1334

## 2020-11-18 NOTE — H&P (Signed)
History and Physical   Barbara Hill TMH:[id number] DOB: 09-06-1940 DOA: 11/18/2020  PCP: Richmond Campbell., PA-C   Patient coming from: Home  Chief Complaint: Weakness, confusion, lethargy  HPI: Barbara Hill is a 80 y.o. female with medical history significant of dementia, anxiety, depression, brain aneurysm, CVA, CKD 3, COPD, GERD, paroxysmal SVT, normocytic anemia, orthostatic hypotension, mitral valve prolapse who presents with ongoing weakness, confusion and lethargy.  Due to patient's dementia some history obtained with the assistance of chart review and family.  Patient has had ongoing cough for a few weeks.  Was started on antibiotics by PCP last week and finished 10 day course yesterday, unknown what antibiotic.  Patient has continued to have a cough and reportedly had a fever to 101 at home.  She is also been experiencing increased weakness, confusion and lethargy for the past few days.  Per family, patient has reported some dysuria. Patient denies chest pain, shortness of breath, abdominal pain, constipation, diarrhea, nausea, vomiting.  ED Course: Vital signs in the ED were stable.  Lab work-up showed CMP with potassium 3.2, chloride 97, albumin 2.7.  CBC showed leukocytosis to 13.4 and platelets of 47.  Lactic acid normal, respiratory panel flu COVID negative, urinalysis showing only moderate hemoglobin, blood cultures pending.  Chest x-ray showed bibasilar atelectasis versus infiltration with a small left pleural effusion.  Patient was given antibiotics, IV fluids in the ED.  Review of Systems: As per HPI otherwise all other systems reviewed and are negative.  Past Medical History:  Diagnosis Date  . Anxiety   . Brain aneurysm    pipeline stent 2017   . COPD (chronic obstructive pulmonary disease) (HCC)   . Fatigue   . H/O: hysterectomy   . Headache(784.0)   . History of cardiac catheterization    LHC 10/02: Normal coronary arteries  . History of nuclear stress test     Myoview 10/12: Normal stress nuclear study.  . Hypertension   . Mitral valve prolapse    Echo 8/17:Mild LVH, EF 60-65, normal wall motion, grade 2 diastolic dysfunction, mild AI, mild MR, mild LAE // echo 11/01: EF 60%, mild anterior MVP with trivial MR, PASP 35-40 mmHg, LAE  . Orthostasis   . PSVT (paroxysmal supraventricular tachycardia) (HCC)    palps  . Radiculopathy     Past Surgical History:  Procedure Laterality Date  . ABDOMINAL HYSTERECTOMY    . APPENDECTOMY    . BUBBLE STUDY  01/29/2020   Procedure: BUBBLE STUDY;  Surgeon: Parke Poisson, MD;  Location: [facility] ENDOSCOPY;  Service: Cardiology;;  . CARDIAC CATHETERIZATION  12/95  . CATARACT EXTRACTION    . CHOLECYSTECTOMY    . EMBOLECTOMY Bilateral 01/25/2020   Procedure: Bilateral popliteal EMBOLECTOMY.;  Surgeon: [name], MD;  Location: [facility] OR;  Service: Vascular;  Laterality: Bilateral;  . FASCIOTOMY Bilateral 01/26/2020   Procedure: FASCIOTOMY;  Surgeon: [name], MD;  [facility];  Service: Vascular;  Laterality: Bilateral;  . IR GENERIC HISTORICAL  06/02/2016   IR RADIOLOGIST EVAL & MGMT 06/02/2016 [facility]  . IR GENERIC HISTORICAL  06/29/2016   IR ANGIO INTRA EXTRACRAN SEL INTERNAL CAROTID UNI R MOD SED 06/29/2016 [name], MD [facility]  . IR GENERIC HISTORICAL  06/29/2016   IR ANGIOGRAM FOLLOW UP STUDY 06/29/2016 [name], MD [facility]  . IR GENERIC HISTORICAL  06/29/2016   IR NEURO EACH ADD'L AFTER BASIC UNI RIGHT (MS) 06/29/2016 [name], MD [facility]  .  IR GENERIC HISTORICAL  06/29/2016   IR 3D INDEPENDENT WKST 06/29/2016 [name], MD [facility]  . IR GENERIC HISTORICAL  06/29/2016   IR TRANSCATH/EMBOLIZ 06/29/2016 [name], MD [facility]  . IR GENERIC HISTORICAL  06/29/2016   IR ANGIO VERTEBRAL SEL SUBCLAVIAN INNOMINATE UNI R MOD SED 06/29/2016 [name], MD [facility]  . IR GENERIC HISTORICAL  07/28/2016   IR RADIOLOGIST EVAL & MGMT  07/28/2016 [facility]  . IR GENERIC HISTORICAL  11/12/2016   IR ANGIO INTRA EXTRACRAN SEL COM CAROTID INNOMINATE BILAT MOD SED 11/12/2016 [name], MD [facility]  . IR GENERIC HISTORICAL  11/12/2016   IR ANGIO VERTEBRAL SEL VERTEBRAL UNI L MOD SED 11/12/2016 [name], MD [facility]  . IR GENERIC HISTORICAL  11/12/2016   IR ANGIO VERTEBRAL SEL SUBCLAVIAN INNOMINATE UNI R MOD SED 11/12/2016 [name], MD [facility]  . NASAL SEPTUM SURGERY    . RADIOLOGY WITH ANESTHESIA N/A 06/29/2016   Procedure: EMBOLIZATION;  Surgeon: [name], MD;  Location: [facility] OR;  Service: Radiology;  Laterality: N/A;  . ROTATOR CUFF REPAIR    . TEE WITHOUT CARDIOVERSION N/A 01/29/2020   Procedure: TRANSESOPHAGEAL ECHOCARDIOGRAM (TEE);  Surgeon: Parke Poisson, MD;  Location: [facility] ENDOSCOPY;  Service: Cardiology;  Laterality: N/A;    Social History  reports that she has been smoking cigarettes. She has a 50.00 pack-year smoking history. She has never used smokeless tobacco. She reports that she does not drink alcohol and does not use drugs.  Allergies  Allergen Reactions  . Dobutamine Other (See Comments)    Heart beating hard with CP, neck pain, and weakness  . Epinephrine Other (See Comments)    Fast heart beat  . Aspirin Nausea And Vomiting    History of stomach ulcer  . Clarithromycin Other (See Comments)    Yeast infection  . Doxycycline Other (See Comments)    Makes stomach hurt  . Excedrin Extra Strength [Aspirin-Acetaminophen-Caffeine] Nausea And Vomiting  . Penicillins Other (See Comments)    Yeast infection  . Prednisone Other (See Comments)    Shakes   . Propoxyphene Nausea And Vomiting    Family History  Problem Relation Age of Onset  . Heart attack Mother   . Transient ischemic attack Mother   . Dementia Mother        "at the end"  . Heart attack Father 6  . Dementia Brother   . Heart attack Other   . Asthma Other   . Heart failure Other   .  Osteoporosis Other   . Heart Problems Brother   . CAD Other   Reviewed on admission  Prior to Admission medications   Medication Sig Start Date End Date Taking? Authorizing Provider  acetaminophen (TYLENOL) 650 MG CR tablet Take 650 mg by mouth every 8 (eight) hours as needed for pain.    [provider]  ALPRAZolam [name]) 0.5 MG tablet Take 1 tablet (0.5 mg total) by mouth 3 (three) times daily as needed for anxiety. Patient taking differently: Take 0.25-0.5 mg by mouth 3 (three) times daily as needed for anxiety. 02/09/20   Almon Hercules, MD  amLODipine (NORVASC) 5 MG tablet Take 1 tablet (5 mg total) by mouth daily. 09/21/20   Azucena Fallen, MD  apixaban (ELIQUIS) 5 MG TABS tablet Take 1 tablet (5 mg total) by mouth 2 (two) times daily. 02/09/20   Almon Hercules, MD  B Complex Vitamins (B COMPLEX VITAMIN PO) Take by  mouth.    [provider]  butalbital-acetaminophen-caffeine (FIORICET) 50-325-40 MG tablet Take 1 tablet by mouth every 8 (eight) hours as needed for headache or migraine. 02/09/20   Almon HerculesGonfa, Taye T, MD  cholecalciferol (VITAMIN D) 1000 units tablet Take 1,000 Units by mouth daily.    [provider]  clopidogrel (PLAVIX) 75 MG tablet Take 1 tablet (75 mg total) by mouth daily. 09/24/20   Louk, Waylan BogaAlexandra M, PA-C  famotidine (PEPCID) 20 MG tablet Take 20 mg by mouth at bedtime as needed for heartburn. 07/16/20   [provider]  feeding supplement, ENSURE ENLIVE, (ENSURE ENLIVE) LIQD Take 237 mLs by mouth 2 (two) times daily between meals. 02/09/20   Almon HerculesGonfa, Taye T, MD  ferrous sulfate 325 (65 FE) MG tablet Take 1 tablet (325 mg total) by mouth 2 (two) times daily with a meal. Patient not taking: Reported on 09/18/2020 02/09/20 08/07/20  Almon HerculesGonfa, Taye T, MD  flecainide (TAMBOCOR) 50 MG tablet Take 1 tablet (50 mg total) by mouth 2 (two) times daily. Patient taking differently: Take 75 mg by mouth 2 (two) times daily. 07/29/20   Pricilla Riffleoss, Paula V, MD   FLUoxetine (PROZAC) 20 MG capsule Take 20 mg by mouth 2 (two) times daily. 06/07/16   [provider]  fluticasone (FLONASE) 50 MCG/ACT nasal spray Place 1 spray into both nostrils daily as needed for allergies or rhinitis.  10/11/14   [provider]  losartan (COZAAR) 25 MG tablet Take 25 mg by mouth daily.    [provider]  metoprolol succinate (TOPROL-XL) 25 MG 24 hr tablet TAKE 1 TABLET BY MOUTH EVERY DAY AT BEDTIME Patient taking differently: Take 25 mg by mouth at bedtime. 06/20/20   Pricilla Riffleoss, Paula V, MD  mirtazapine (REMERON) 7.5 MG tablet Take 7.5 mg by mouth at bedtime. 08/18/20   [provider]  Multiple Vitamin (MULTIVITAMIN WITH MINERALS) TABS tablet Take 1 tablet by mouth daily. 02/09/20   Almon HerculesGonfa, Taye T, MD  omeprazole (PRILOSEC) 40 MG capsule Take 40 mg by mouth daily.    [provider]  polyethylene glycol powder (MIRALAX) 17 GM/SCOOP powder Take 17 g by mouth 2 (two) times daily as needed for moderate constipation. 02/09/20   Almon HerculesGonfa, Taye T, MD  QUEtiapine (SEROQUEL) 25 MG tablet Take 1 tablet (25 mg total) by mouth 2 (two) times daily. 11/18/20   Anson FretAhern, Antonia B, MD  senna-docusate (SENOKOT-S) 8.6-50 MG tablet Take 1 tablet by mouth 2 (two) times daily between meals as needed for mild constipation. 02/09/20   Almon HerculesGonfa, Taye T, MD  vitamin B-12 1000 MCG tablet Take 1 tablet (1,000 mcg total) by mouth daily. 02/09/20   Almon HerculesGonfa, Taye T, MD    Physical Exam: Vitals:   11/18/20 1800 11/18/20 1830 11/18/20 1927 11/18/20 2110  BP: (!) 144/62 (!) 150/63 (!) 142/62 (!) 164/63  Pulse: 69 68 67 63  Resp: 14 16 19 14   Temp:    98.9 F (37.2 C)  TempSrc:    Oral  SpO2: 95% 99% 96% 100%  Weight:    46.1 kg  Height:       Physical Exam Constitutional:      General: She is not in acute distress.    Comments: Thin elderly female  HENT:     Head: Normocephalic and atraumatic.     Mouth/Throat:     Mouth: Mucous membranes are moist.     Pharynx:  Oropharynx is clear.  Eyes:     Extraocular Movements:  Extraocular movements intact.     Pupils: Pupils are equal, round, and reactive to light.  Cardiovascular:     Rate and Rhythm: Normal rate and regular rhythm.     Pulses: Normal pulses.     Heart sounds: Normal heart sounds.  Pulmonary:     Effort: Pulmonary effort is normal. No respiratory distress.     Breath sounds: Normal breath sounds.  Abdominal:     General: Bowel sounds are normal. There is no distension.     Palpations: Abdomen is soft.     Tenderness: There is no abdominal tenderness.  Musculoskeletal:        General: No swelling or deformity.  Skin:    General: Skin is warm and dry.  Neurological:     General: No focal deficit present.     Comments: A&O to person and place, not year    Labs on Admission: I have personally reviewed following labs and imaging studies  CBC: Recent Labs  Lab 11/18/20 1429  WBC 13.9*  NEUTROABS 11.1*  HGB 12.5  HCT 37.1  MCV 90.5  PLT 487*    Basic Metabolic Panel: Recent Labs  Lab 11/18/20 1429  NA 136  K 3.2*  CL 97*  CO2 27  GLUCOSE 102*  BUN 16  CREATININE 1.06*  CALCIUM 9.1    GFR: Estimated Creatinine Clearance: 31.8 mL/min (A) (by C-G formula based on SCr of 1.06 mg/dL (H)).  Liver Function Tests: Recent Labs  Lab 11/18/20 1429  AST 20  ALT 15  ALKPHOS 100  BILITOT 0.3  PROT 8.1  ALBUMIN 2.7*    Urine analysis:    Component Value Date/Time   COLORURINE YELLOW 11/18/2020 1728   APPEARANCEUR CLEAR 11/18/2020 1728   APPEARANCEUR Clear 09/08/2012 0030   LABSPEC 1.025 11/18/2020 1728   LABSPEC 1.013 09/08/2012 0030   PHURINE 5.0 11/18/2020 1728   GLUCOSEU NEGATIVE 11/18/2020 1728   GLUCOSEU Negative 09/08/2012 0030   HGBUR MODERATE (A) 11/18/2020 1728   BILIRUBINUR NEGATIVE 11/18/2020 1728   BILIRUBINUR Negative 09/08/2012 0030   KETONESUR NEGATIVE 11/18/2020 1728   PROTEINUR NEGATIVE 11/18/2020 1728   UROBILINOGEN 0.2 01/18/2015 2006    NITRITE NEGATIVE 11/18/2020 1728   LEUKOCYTESUR NEGATIVE 11/18/2020 1728   LEUKOCYTESUR Negative 09/08/2012 0030    Radiological Exams on Admission: DG Chest Portable 1 View  Result Date: 11/18/2020 CLINICAL DATA:  Shortness of breath. EXAM: PORTABLE CHEST 1 VIEW COMPARISON:  September 18, 2020. FINDINGS: The heart size and mediastinal contours are within normal limits. No pneumothorax is noted. Mild bibasilar atelectasis or infiltrates are noted. Small left pleural effusion is noted. The visualized skeletal structures are unremarkable. IMPRESSION: Mild bibasilar atelectasis or infiltrates are noted with small left pleural effusion. Followup PA and lateral chest X-ray is recommended in 3-4 weeks following trial of antibiotic therapy to ensure resolution and exclude underlying malignancy. Electronically Signed   By: Lupita Raider M.D.   On: 11/18/2020 14:35    EKG: Independently reviewed.  Sinus rhythm at 67 bpm.  Low voltage.  Baseline wander and some baseline artifact.  Assessment/Plan Principal Problem:   CAP (community acquired pneumonia) Active Problems:   Paroxysmal SVT (supraventricular tachycardia) (HCC)   Hypokalemia   COPD (chronic obstructive pulmonary disease) (HCC)   History of CVA (cerebrovascular accident)   Anxiety   CKD (chronic kidney disease), stage III (HCC)  Community aquired pneumonia > Patient presenting with worsened weakness confusion and lethargy in the past few days in the setting of  recent antibiotic treatment for possible pneumonia due to ongoing cough. > With continued symptoms and chest x-ray findings that could be compatible with pneumonia as well as leukocytosis to greater than 13 we will consider patient is having failed outpatient therapy and will observe on IV antibiotics overnight. > Covid negative - Continuous pulse ox - Continue ceftriaxone and azithromycin - Trend fever curve and white count  History of CVA - Continue aspirin and  Plavix  History of dementia > Alert and oriented to person and place but not year - Delirium precautions  CKD 3 Hypokalemia > Potassium 3.2 in ED. > Creatinine stable in the ED. - Avoid nephrotoxic agents - K-Dur 40 mEq - Check magnesium - Trend renal function and electrolytes  History of COPD > Reported but not on any home inhalers  Paroxysmal SVT - Continue home flecainide and metoprolol  Hypertension - Continue home amlodipine, losartan  Anxiety Depression - Continue home Prozac, mirtazapine, as needed Xanax  DVT prophylaxis: Lovenox  Code Status:   Full  Family Communication:  Daughter Updated by Phone  Disposition Plan:   Patient is from:  Home  Anticipated DC to:  Home  Anticipated DC date:  1 to 2 days  Anticipated DC barriers: None  Consults called:  None  Admission status:  Observation, MedSurg   Severity of Illness: The appropriate patient status for this patient is OBSERVATION. Observation status is judged to be reasonable and necessary in order to provide the required intensity of service to ensure the patient's safety. The patient's presenting symptoms, physical exam findings, and initial radiographic and laboratory data in the context of their medical condition is felt to place them at decreased risk for further clinical deterioration. Furthermore, it is anticipated that the patient will be medically stable for discharge from the hospital within 2 midnights of admission. The following factors support the patient status of observation.   " The patient's presenting symptoms include weakness, confusion, lethargy. " The physical exam findings include stable exam findings, mild confusion. " The initial radiographic and laboratory data are leukocytosis of 13.4, chest x-ray with atelectasis versus infiltrate and a left pleural effusion.   Synetta Fail MD [facility]  How to contact the [facility] Attending or Consulting provider 7A - 7P or covering  provider during after hours 7P -7A, for this patient?   1. Check the care team in [facility] and look for a) attending/consulting TR[facility] provider listed and b) the [facility] team listed 2. Log into [email or web address] and use [place]'s universal password to access. If you do not have the password, please contact the hospital operator. 3. Locate the [facility] provider you are looking for under [facility] and page to a number that you can be directly reached. 4. If you still have difficulty reaching the provider, please page the [facility] (Director on Call) for the Hospitalists listed on amion for assistance.  11/18/2020, 9:43 PM

## 2020-11-18 NOTE — Addendum Note (Signed)
Addended by: Judi Cong on: 11/18/2020 11:13 AM   Modules accepted: Orders

## 2020-11-18 NOTE — ED Triage Notes (Signed)
Pt is being treated for a cough through PCP x 3 weeks.  Per daughter, pt has had increased weakness, fatigue, DOE, decreased appetite, and some increased confusion, back pain & dysuria.  Denies CP.

## 2020-11-18 NOTE — Telephone Encounter (Signed)
Called the pt's daughter back and advised that Dr Lucia Gaskins will send in a medication for the pt. Seroquel 25 mg BID prn for hallucinations. Confirmed the pharmacy on file is correct. Daughter was appreciative for the call back.

## 2020-11-19 ENCOUNTER — Observation Stay (HOSPITAL_COMMUNITY): Payer: Medicare HMO

## 2020-11-19 DIAGNOSIS — D631 Anemia in chronic kidney disease: Secondary | ICD-10-CM | POA: Diagnosis present

## 2020-11-19 DIAGNOSIS — Z825 Family history of asthma and other chronic lower respiratory diseases: Secondary | ICD-10-CM | POA: Diagnosis not present

## 2020-11-19 DIAGNOSIS — F0390 Unspecified dementia without behavioral disturbance: Secondary | ICD-10-CM | POA: Diagnosis present

## 2020-11-19 DIAGNOSIS — J440 Chronic obstructive pulmonary disease with acute lower respiratory infection: Secondary | ICD-10-CM | POA: Diagnosis present

## 2020-11-19 DIAGNOSIS — J189 Pneumonia, unspecified organism: Secondary | ICD-10-CM | POA: Diagnosis present

## 2020-11-19 DIAGNOSIS — Z8249 Family history of ischemic heart disease and other diseases of the circulatory system: Secondary | ICD-10-CM | POA: Diagnosis not present

## 2020-11-19 DIAGNOSIS — Z8673 Personal history of transient ischemic attack (TIA), and cerebral infarction without residual deficits: Secondary | ICD-10-CM | POA: Diagnosis not present

## 2020-11-19 DIAGNOSIS — E876 Hypokalemia: Secondary | ICD-10-CM | POA: Diagnosis present

## 2020-11-19 DIAGNOSIS — I341 Nonrheumatic mitral (valve) prolapse: Secondary | ICD-10-CM | POA: Diagnosis present

## 2020-11-19 DIAGNOSIS — Z8262 Family history of osteoporosis: Secondary | ICD-10-CM | POA: Diagnosis not present

## 2020-11-19 DIAGNOSIS — I4891 Unspecified atrial fibrillation: Secondary | ICD-10-CM

## 2020-11-19 DIAGNOSIS — Z9049 Acquired absence of other specified parts of digestive tract: Secondary | ICD-10-CM | POA: Diagnosis not present

## 2020-11-19 DIAGNOSIS — Z90710 Acquired absence of both cervix and uterus: Secondary | ICD-10-CM | POA: Diagnosis not present

## 2020-11-19 DIAGNOSIS — I361 Nonrheumatic tricuspid (valve) insufficiency: Secondary | ICD-10-CM | POA: Diagnosis not present

## 2020-11-19 DIAGNOSIS — Z881 Allergy status to other antibiotic agents status: Secondary | ICD-10-CM | POA: Diagnosis not present

## 2020-11-19 DIAGNOSIS — F419 Anxiety disorder, unspecified: Secondary | ICD-10-CM | POA: Diagnosis present

## 2020-11-19 DIAGNOSIS — I129 Hypertensive chronic kidney disease with stage 1 through stage 4 chronic kidney disease, or unspecified chronic kidney disease: Secondary | ICD-10-CM | POA: Diagnosis present

## 2020-11-19 DIAGNOSIS — I471 Supraventricular tachycardia: Secondary | ICD-10-CM | POA: Diagnosis present

## 2020-11-19 DIAGNOSIS — F17210 Nicotine dependence, cigarettes, uncomplicated: Secondary | ICD-10-CM | POA: Diagnosis present

## 2020-11-19 DIAGNOSIS — Z880 Allergy status to penicillin: Secondary | ICD-10-CM | POA: Diagnosis not present

## 2020-11-19 DIAGNOSIS — Z888 Allergy status to other drugs, medicaments and biological substances status: Secondary | ICD-10-CM | POA: Diagnosis not present

## 2020-11-19 DIAGNOSIS — Z20822 Contact with and (suspected) exposure to covid-19: Secondary | ICD-10-CM | POA: Diagnosis present

## 2020-11-19 DIAGNOSIS — J449 Chronic obstructive pulmonary disease, unspecified: Secondary | ICD-10-CM | POA: Diagnosis not present

## 2020-11-19 DIAGNOSIS — N1831 Chronic kidney disease, stage 3a: Secondary | ICD-10-CM | POA: Diagnosis present

## 2020-11-19 DIAGNOSIS — A419 Sepsis, unspecified organism: Secondary | ICD-10-CM | POA: Diagnosis present

## 2020-11-19 DIAGNOSIS — F32A Depression, unspecified: Secondary | ICD-10-CM | POA: Diagnosis present

## 2020-11-19 DIAGNOSIS — Z7901 Long term (current) use of anticoagulants: Secondary | ICD-10-CM | POA: Diagnosis not present

## 2020-11-19 LAB — ECHOCARDIOGRAM COMPLETE
Area-P 1/2: 4.07 cm2
Calc EF: 66.5 %
Height: 63 in
S' Lateral: 1.7 cm
Single Plane A2C EF: 64.8 %
Single Plane A4C EF: 65.9 %
Weight: 1626.11 oz

## 2020-11-19 LAB — CBC
HCT: 30.8 % — ABNORMAL LOW (ref 36.0–46.0)
Hemoglobin: 10.1 g/dL — ABNORMAL LOW (ref 12.0–15.0)
MCH: 30.2 pg (ref 26.0–34.0)
MCHC: 32.8 g/dL (ref 30.0–36.0)
MCV: 92.2 fL (ref 80.0–100.0)
Platelets: 408 10*3/uL — ABNORMAL HIGH (ref 150–400)
RBC: 3.34 MIL/uL — ABNORMAL LOW (ref 3.87–5.11)
RDW: 12.9 % (ref 11.5–15.5)
WBC: 10.7 10*3/uL — ABNORMAL HIGH (ref 4.0–10.5)
nRBC: 0 % (ref 0.0–0.2)

## 2020-11-19 LAB — BASIC METABOLIC PANEL
Anion gap: 9 (ref 5–15)
BUN: 14 mg/dL (ref 8–23)
CO2: 24 mmol/L (ref 22–32)
Calcium: 8.1 mg/dL — ABNORMAL LOW (ref 8.9–10.3)
Chloride: 105 mmol/L (ref 98–111)
Creatinine, Ser: 0.9 mg/dL (ref 0.44–1.00)
GFR, Estimated: >60 mL/min (ref >60)
Glucose, Bld: 78 mg/dL (ref 70–99)
Potassium: 2.8 mmol/L — ABNORMAL LOW (ref 3.5–5.1)
Sodium: 138 mmol/L (ref 135–145)

## 2020-11-19 MED ORDER — MAGNESIUM SULFATE 4 GM/100ML IV SOLN
4.0000 g | Freq: Once | INTRAVENOUS | Status: AC
  Administered 2020-11-19: 4 g via INTRAVENOUS
  Filled 2020-11-19: qty 100

## 2020-11-19 MED ORDER — ENOXAPARIN SODIUM 40 MG/0.4ML ~~LOC~~ SOLN
40.0000 mg | SUBCUTANEOUS | Status: DC
  Administered 2020-11-19 – 2020-11-21 (×2): 40 mg via SUBCUTANEOUS
  Filled 2020-11-19 (×3): qty 0.4

## 2020-11-19 MED ORDER — POTASSIUM CHLORIDE CRYS ER 20 MEQ PO TBCR
60.0000 meq | EXTENDED_RELEASE_TABLET | Freq: Two times a day (BID) | ORAL | Status: AC
  Administered 2020-11-19 (×2): 60 meq via ORAL
  Filled 2020-11-19 (×2): qty 3

## 2020-11-19 NOTE — Progress Notes (Signed)
  Echocardiogram 2D Echocardiogram has been performed.  Barbara Hill 11/19/2020, 1:48 PM

## 2020-11-19 NOTE — Progress Notes (Signed)
PROGRESS NOTE    Barbara Hill  ZOX:[id number] DOB: 02/03/1941 DOA: 11/18/2020 PCP: Richmond CampbellKaplan, Kristen W., PA-C    Brief Narrative:  80 y.o. female with medical history significant of dementia, anxiety, depression, brain aneurysm, CVA, CKD 3, COPD, GERD, paroxysmal SVT, normocytic anemia, orthostatic hypotension, mitral valve prolapse who presents with ongoing weakness, confusion and lethargy, found to have    Assessment & Plan:   Principal Problem:   CAP (community acquired pneumonia) Active Problems:   Paroxysmal SVT (supraventricular tachycardia) (HCC)   Hypokalemia   COPD (chronic obstructive pulmonary disease) (HCC)   History of CVA (cerebrovascular accident)   Anxiety   CKD (chronic kidney disease), stage III (HCC)   Community aquired pneumonia > Patient presenting with worsened weakness confusion and lethargy in the past few days in the setting of recent antibiotic treatment for possible pneumonia due to ongoing cough. > With continued symptoms and chest x-ray findings that is suggestive of pneumonia -Continued on azithormycin with rocephin > Covid negative - Had required [facility] at presentation, weaning O2 - Continue ceftriaxone and azithromycin - Tmax today 68F -Will repeat CBC  History of CVA - Continue aspirin and Plavix -Seems stable -Will consult PT  History of dementia > Noted to be alert and oriented to person and place but not year - Continue with delirium precautions  CKD 3 Hypokalemia, hypomagnesemia > Potassium 3.2 in ED, repeat of 2.8 this AM -Will replace with KCl - Avoid nephrotoxic agents - K-Dur 40 mEq - Mg of 1.2 today, will replace - will repeat BMET and Mg in AM  History of COPD > No audible wheezing on exam  Paroxysmal SVT - Continue home flecainide and metoprolol -STable at present  Hypertension - Continue home amlodipine, losartan  Anxiety Depression - Continue home Prozac, mirtazapine, as needed Xanax  DVT prophylaxis:  Will order lovenox  Code Status: Full Family Communication: Pt in room, family not at bedside  Status is: Observation  The patient will require care spanning > 2 midnights and should be moved to inpatient because: IV treatments appropriate due to intensity of illness or inability to take PO and Inpatient level of care appropriate due to severity of illness  Dispo: The patient is from: Home              Anticipated d/c is to: Unknown, pending PT eval              Patient currently is not medically stable to d/c.   Difficult to place patient No       Consultants:     Procedures:     Antimicrobials: Anti-infectives (From admission, onward)   Start     Dose/Rate Route Frequency Ordered Stop   11/19/20 2000  azithromycin (ZITHROMAX) 500 mg in sodium chloride 0.9 % 250 mL IVPB        500 mg 250 mL/hr over 60 Minutes Intravenous Every 24 hours 11/18/20 2134     11/19/20 1800  cefTRIAXone (ROCEPHIN) 1 g in sodium chloride 0.9 % 100 mL IVPB        1 g 200 mL/hr over 30 Minutes Intravenous Every 24 hours 11/18/20 2134     11/18/20 1815  cefTRIAXone (ROCEPHIN) 1 g in sodium chloride 0.9 % 100 mL IVPB        1 g 200 mL/hr over 30 Minutes Intravenous  Once 11/18/20 1805 11/18/20 1923   11/18/20 1815  azithromycin (ZITHROMAX) 500 mg in sodium chloride 0.9 % 250 mL IVPB  500 mg 250 mL/hr over 60 Minutes Intravenous  Once 11/18/20 1805 11/18/20 2025       Subjective: States breathing better today, but still very weak  Objective: Vitals:   11/19/20 0512 11/19/20 0911 11/19/20 1212 11/19/20 1522  BP: (!) 151/91 136/82  104/79  Pulse: (!) 107 (!) 108  (!) 109  Resp: 18 18  16   Temp: 98.9 F (37.2 C) 98.5 F (36.9 C)  98.9 F (37.2 C)  TempSrc: Oral Oral  Oral  SpO2: 94% 96% 95% 94%  Weight:      Height:        Intake/Output Summary (Last 24 hours) at 11/19/2020 1614 Last data filed at 11/19/2020 1200 Gross per 24 hour  Intake 1935.44 ml  Output 400 ml  Net 1535.44  ml   Filed Weights   11/18/20 1310 11/18/20 2110  Weight: 44.5 kg 46.1 kg    Examination: General exam: Awake, laying in bed, in nad Respiratory system: Normal respiratory effort, no wheezing Cardiovascular system: regular rate, s1, s2 Gastrointestinal system: Soft, nondistended, positive BS Central nervous system: CN2-12 grossly intact, strength intact Extremities: Perfused, no clubbing Skin: Normal skin turgor, no notable skin lesions seen Psychiatry: Mood normal // no visual hallucinations   Data Reviewed: I have personally reviewed following labs and imaging studies  CBC: Recent Labs  Lab 11/18/20 1429 11/19/20 0544  WBC 13.9* 10.7*  NEUTROABS 11.1*  --   HGB 12.5 10.1*  HCT 37.1 30.8*  MCV 90.5 92.2  PLT 487* 408*   Basic Metabolic Panel: Recent Labs  Lab 11/18/20 1429 11/18/20 2150 11/19/20 0544  NA 136  --  138  K 3.2*  --  2.8*  CL 97*  --  105  CO2 27  --  24  GLUCOSE 102*  --  78  BUN 16  --  14  CREATININE 1.06*  --  0.90  CALCIUM 9.1  --  8.1*  MG  --  1.2*  --    GFR: Estimated Creatinine Clearance: 37.5 mL/min (by C-G formula based on SCr of 0.9 mg/dL). Liver Function Tests: Recent Labs  Lab 11/18/20 1429  AST 20  ALT 15  ALKPHOS 100  BILITOT 0.3  PROT 8.1  ALBUMIN 2.7*   No results for input(s): LIPASE, AMYLASE in the last 168 hours. No results for input(s): AMMONIA in the last 168 hours. Coagulation Profile: No results for input(s): INR, PROTIME in the last 168 hours. Cardiac Enzymes: No results for input(s): CKTOTAL, CKMB, CKMBINDEX, TROPONINI in the last 168 hours. BNP (last 3 results) No results for input(s): PROBNP in the last 8760 hours. HbA1C: No results for input(s): HGBA1C in the last 72 hours. CBG: No results for input(s): GLUCAP in the last 168 hours. Lipid Profile: No results for input(s): CHOL, HDL, LDLCALC, TRIG, CHOLHDL, LDLDIRECT in the last 72 hours. Thyroid Function Tests: No results for input(s): TSH,  T4TOTAL, FREET4, T3FREE, THYROIDAB in the last 72 hours. Anemia Panel: No results for input(s): VITAMINB12, FOLATE, FERRITIN, TIBC, IRON, RETICCTPCT in the last 72 hours. Sepsis Labs: Recent Labs  Lab 11/18/20 1516  LATICACIDVEN 1.0    Recent Results (from the past 240 hour(s))  Resp Panel by RT-PCR (Flu A&B, Covid) Nasopharyngeal Swab     Status: None   Collection Time: 11/18/20  2:29 PM   Specimen: Nasopharyngeal Swab; Nasopharyngeal(NP) swabs in vial transport medium  Result Value Ref Range Status   SARS Coronavirus 2 by RT PCR NEGATIVE NEGATIVE Final  Comment: (NOTE) SARS-CoV-2 target nucleic acids are NOT DETECTED.  The SARS-CoV-2 RNA is generally detectable in upper respiratory specimens during the acute phase of infection. The lowest concentration of SARS-CoV-2 viral copies this assay can detect is 138 copies/mL. A negative result does not preclude SARS-Cov-2 infection and should not be used as the sole basis for treatment or other patient management decisions. A negative result may occur with  improper specimen collection/handling, submission of specimen other than nasopharyngeal swab, presence of viral mutation(s) within the areas targeted by this assay, and inadequate number of viral copies(<138 copies/mL). A negative result must be combined with clinical observations, patient history, and epidemiological information. The expected result is Negative.  Fact Sheet for Patients:  BloggerCourse.com  Fact Sheet for Healthcare Providers:  SeriousBroker.it  This test is no t yet approved or cleared by the [place] FDA and  has been authorized for detection and/or diagnosis of SARS-CoV-2 by FDA under an Emergency Use Authorization (EUA). This EUA will remain  in effect (meaning this test can be used) for the duration of the COVID-19 declaration under Section 564(b)(1) of the Act, 21 U.S.C.section 360bbb-3(b)(1),  unless the authorization is terminated  or revoked sooner.       Influenza A by PCR NEGATIVE NEGATIVE Final   Influenza B by PCR NEGATIVE NEGATIVE Final    Comment: (NOTE) The Xpert Xpress SARS-CoV-2/FLU/RSV plus assay is intended as an aid in the diagnosis of influenza from Nasopharyngeal swab specimens and should not be used as a sole basis for treatment. Nasal washings and aspirates are unacceptable for Xpert Xpress SARS-CoV-2/FLU/RSV testing.  Fact Sheet for Patients: BloggerCourse.com  Fact Sheet for Healthcare Providers: SeriousBroker.it  This test is not yet approved or cleared by the [place] FDA and has been authorized for detection and/or diagnosis of SARS-CoV-2 by FDA under an Emergency Use Authorization (EUA). This EUA will remain in effect (meaning this test can be used) for the duration of the COVID-19 declaration under Section 564(b)(1) of the Act, 21 U.S.C. section 360bbb-3(b)(1), unless the authorization is terminated or revoked.  Performed at [facility], [address] [place], [place] [address]   Blood culture (routine x 2)     Status: None (Preliminary result)   Collection Time: 11/18/20  3:15 PM   Specimen: Right Antecubital; Blood  Result Value Ref Range Status   Specimen Description   Final    RIGHT ANTECUBITAL Performed at [facility], [address] [place], [place] [address]    Special Requests   Final    BOTTLES DRAWN AEROBIC AND ANAEROBIC Blood Culture adequate volume Performed at [facility], [address] [place], [place] [address]    Culture   Final    NO GROWTH < 24 HOURS Performed at [facility], [address], [place], [place] [address]    Report Status PENDING  Incomplete  Blood culture (routine x 2)     Status: None (Preliminary result)   Collection Time: 11/18/20  3:20 PM   Specimen: Left Antecubital; Blood  Result Value Ref  Range Status   Specimen Description   Final    LEFT ANTECUBITAL Performed at [facility], [address] [place], [place] [address]    Special Requests   Final    BOTTLES DRAWN AEROBIC AND ANAEROBIC Blood Culture results may not be optimal due to an excessive volume of blood received in culture bottles Performed at [facility], [address] [place], [place] [address]    Culture   Final    NO GROWTH < 24 HOURS Performed at [facility], [address] [place], [place] [address]    Report Status PENDING  Incomplete     Radiology Studies: DG Chest Portable 1 View  Result Date: 11/18/2020 CLINICAL DATA:  Shortness of breath. EXAM: PORTABLE CHEST 1 VIEW COMPARISON:  September 18, 2020. FINDINGS: The heart size and mediastinal contours are within normal limits. No pneumothorax is noted. Mild bibasilar atelectasis or infiltrates are noted. Small left pleural effusion is noted. The visualized skeletal structures are unremarkable. IMPRESSION: Mild bibasilar atelectasis or infiltrates are noted with small left pleural effusion. Followup PA and lateral chest X-ray is recommended in 3-4 weeks following trial of antibiotic therapy to ensure resolution and exclude underlying malignancy. Electronically Signed   By: Lupita Raider M.D.   On: 11/18/2020 14:35    Scheduled Meds: . amLODipine  5 mg Oral Daily  . clopidogrel  75 mg Oral Daily  . feeding supplement  237 mL Oral BID BM  . flecainide  50 mg Oral BID  . FLUoxetine  20 mg Oral BID  . losartan  25 mg Oral Daily  . metoprolol succinate  25 mg Oral QHS  . mirtazapine  7.5 mg Oral QHS  . pantoprazole  40 mg Oral Daily  . potassium chloride  60 mEq Oral BID  . sodium chloride flush  3 mL Intravenous Q12H   Continuous Infusions: . sodium chloride 1,000 mL (11/19/20 0122)  . azithromycin    . cefTRIAXone (ROCEPHIN)  IV       LOS: 0 days   Rickey Barbara, MD [facility] Pager On Amion  If 7PM-7AM,  please contact night-coverage 11/19/2020, 4:14 PM

## 2020-11-19 NOTE — Evaluation (Signed)
Physical Therapy Evaluation Patient Details Name: Barbara Hill MRN: [id number] DOB: April 27, 1941 Today's Date: 11/19/2020   History of Present Illness  80 y.o. female with medical history significant of dementia, anxiety, depression, brain aneurysm, CVA, CKD 3, COPD, GERD, paroxysmal SVT, normocytic anemia, orthostatic hypotension, mitral valve prolapse who presents with ongoing weakness, confusion and lethargy. Dx of CAP.  Clinical Impression  Pt admitted with above diagnosis. Pt ambulated 24' with RW, SaO2 95% on room air walking, no loss of balance. Pt has 24* assist available at home. Good progress expected.  Pt currently with functional limitations due to the deficits listed below (see PT Problem List). Pt will benefit from skilled PT to increase their independence and safety with mobility to allow discharge to the venue listed below.       Follow Up Recommendations Home health PT    Equipment Recommendations  Rolling walker with 5" wheels    Recommendations for Other Services       Precautions / Restrictions Precautions Precautions: Fall Restrictions Weight Bearing Restrictions: No      Mobility  Bed Mobility Overal bed mobility: Modified Independent             General bed mobility comments: used rail, HOB up    Transfers Overall transfer level: Needs assistance Equipment used: Rolling walker (2 wheeled) Transfers: Sit to/from Stand Sit to Stand: Min assist         General transfer comment: VCs hand placement, min A to power up  Ambulation/Gait Ambulation/Gait assistance: Min guard Gait Distance (Feet): 24 Feet Assistive device: Rolling walker (2 wheeled) Gait Pattern/deviations: Step-through pattern;Decreased stride length Gait velocity: decr   General Gait Details: distance limited by fatigue, SaO2 95% on room air walking, no loss of balance, VCs to step closer to RW and increase step length  Stairs            Wheelchair Mobility     Modified Rankin (Stroke Patients Only)       Balance Overall balance assessment: Needs assistance   Sitting balance-Leahy Scale: Good       Standing balance-Leahy Scale: Fair                               Pertinent Vitals/Pain Pain Assessment: No/denies pain    Home Living Family/patient expects to be discharged to:: Private residence Living Arrangements: Children Available Help at Discharge: Family;Available 24 hours/day Type of Home: House Home Access: Stairs to enter Entrance Stairs-Rails: None Entrance Stairs-Number of Steps: 2 Home Layout: One level Home Equipment: Grab bars - toilet;Grab bars - tub/shower;Walker - 4 wheels;Bedside commode;Shower seat Additional Comments: lives with daughter, grandson and his girlfriend    Prior Function Level of Independence: Independent         Comments: walks without AD, sometimes gets "overbalanced" but denies falls in past 1 year, daughter assists with shower transfers as needed     Hand Dominance        Extremity/Trunk Assessment   Upper Extremity Assessment Upper Extremity Assessment: Overall WFL for tasks assessed    Lower Extremity Assessment Lower Extremity Assessment: Overall WFL for tasks assessed    Cervical / Trunk Assessment Cervical / Trunk Assessment: Normal  Communication   Communication: No difficulties  Cognition Arousal/Alertness: Awake/alert Behavior During Therapy: WFL for tasks assessed/performed Overall Cognitive Status: History of cognitive impairments - at baseline  General Comments: short term memory deficits, h/o dementia      General Comments      Exercises     Assessment/Plan    PT Assessment Patient needs continued PT services  PT Problem List Decreased mobility       PT Treatment Interventions Gait training;Therapeutic exercise;Functional mobility training;Patient/family education    PT Goals (Current goals  can be found in the Care Plan section)  Acute Rehab PT Goals Patient Stated Goal: DC home PT Goal Formulation: With patient/family Time For Goal Achievement: 12/03/20 Potential to Achieve Goals: Good    Frequency Min 3X/week   Barriers to discharge        Co-evaluation               AM-PAC PT "6 Clicks" Mobility  Outcome Measure Help needed turning from your back to your side while in a flat bed without using bedrails?: A Little Help needed moving from lying on your back to sitting on the side of a flat bed without using bedrails?: A Little Help needed moving to and from a bed to a chair (including a wheelchair)?: A Little Help needed standing up from a chair using your arms (e.g., wheelchair or bedside chair)?: A Little Help needed to walk in hospital room?: A Little Help needed climbing 3-5 steps with a railing? : A Lot 6 Click Score: 17    End of Session Equipment Utilized During Treatment: Gait belt Activity Tolerance: Patient tolerated treatment well Patient left: in chair;with call bell/phone within reach;with chair alarm set Nurse Communication: Mobility status PT Visit Diagnosis: Difficulty in walking, not elsewhere classified (R26.2)    Time: 9371-6967 PT Time Calculation (min) (ACUTE ONLY): 32 min   Charges:   PT Evaluation $PT Eval Low Complexity: 1 Low PT Treatments $Gait Training: 8-22 mins   Ralene Bathe Kistler PT 11/19/2020  Acute Rehabilitation Services Pager [phone] Office [phone]

## 2020-11-19 NOTE — Progress Notes (Signed)
Rt gave pt flutter valve. Pt knows and understands how to use. 

## 2020-11-20 DIAGNOSIS — I10 Essential (primary) hypertension: Secondary | ICD-10-CM

## 2020-11-20 DIAGNOSIS — N1831 Chronic kidney disease, stage 3a: Secondary | ICD-10-CM

## 2020-11-20 DIAGNOSIS — J449 Chronic obstructive pulmonary disease, unspecified: Secondary | ICD-10-CM

## 2020-11-20 LAB — VITAMIN B12: Vitamin B-12: 1351 pg/mL — ABNORMAL HIGH (ref 180–914)

## 2020-11-20 LAB — IRON AND TIBC
Iron: 26 ug/dL — ABNORMAL LOW (ref 28–170)
Saturation Ratios: 19 % (ref 10.4–31.8)
TIBC: 137 ug/dL — ABNORMAL LOW (ref 250–450)
UIBC: 111 ug/dL

## 2020-11-20 LAB — COMPREHENSIVE METABOLIC PANEL
ALT: 11 U/L (ref 0–44)
AST: 16 U/L (ref 15–41)
Albumin: 2.2 g/dL — ABNORMAL LOW (ref 3.5–5.0)
Alkaline Phosphatase: 70 U/L (ref 38–126)
Anion gap: 6 (ref 5–15)
BUN: 11 mg/dL (ref 8–23)
CO2: 23 mmol/L (ref 22–32)
Calcium: 9 mg/dL (ref 8.9–10.3)
Chloride: 110 mmol/L (ref 98–111)
Creatinine, Ser: 0.89 mg/dL (ref 0.44–1.00)
GFR, Estimated: >60 mL/min (ref >60)
Glucose, Bld: 104 mg/dL — ABNORMAL HIGH (ref 70–99)
Potassium: 4.7 mmol/L (ref 3.5–5.1)
Sodium: 139 mmol/L (ref 135–145)
Total Bilirubin: 0.3 mg/dL (ref 0.3–1.2)
Total Protein: 6.3 g/dL — ABNORMAL LOW (ref 6.5–8.1)

## 2020-11-20 LAB — CBC
HCT: 29.7 % — ABNORMAL LOW (ref 36.0–46.0)
Hemoglobin: 9.6 g/dL — ABNORMAL LOW (ref 12.0–15.0)
MCH: 29.6 pg (ref 26.0–34.0)
MCHC: 32.3 g/dL (ref 30.0–36.0)
MCV: 91.7 fL (ref 80.0–100.0)
Platelets: 425 10*3/uL — ABNORMAL HIGH (ref 150–400)
RBC: 3.24 MIL/uL — ABNORMAL LOW (ref 3.87–5.11)
RDW: 13 % (ref 11.5–15.5)
WBC: 8.8 10*3/uL (ref 4.0–10.5)
nRBC: 0 % (ref 0.0–0.2)

## 2020-11-20 LAB — FERRITIN: Ferritin: 283 ng/mL (ref 11–307)

## 2020-11-20 LAB — MAGNESIUM: Magnesium: 2 mg/dL (ref 1.7–2.4)

## 2020-11-20 LAB — FOLATE: Folate: 21.6 ng/mL (ref >5.9)

## 2020-11-20 MED ORDER — IPRATROPIUM BROMIDE 0.02 % IN SOLN
0.5000 mg | Freq: Four times a day (QID) | RESPIRATORY_TRACT | Status: DC
  Administered 2020-11-20 (×2): 0.5 mg via RESPIRATORY_TRACT
  Filled 2020-11-20 (×2): qty 2.5

## 2020-11-20 MED ORDER — ADULT MULTIVITAMIN W/MINERALS CH
1.0000 | ORAL_TABLET | Freq: Every day | ORAL | Status: DC
  Administered 2020-11-20 – 2020-11-22 (×3): 1 via ORAL
  Filled 2020-11-20 (×3): qty 1

## 2020-11-20 MED ORDER — GUAIFENESIN ER 600 MG PO TB12
1200.0000 mg | ORAL_TABLET | Freq: Two times a day (BID) | ORAL | Status: DC
  Administered 2020-11-20 – 2020-11-22 (×5): 1200 mg via ORAL
  Filled 2020-11-20 (×5): qty 2

## 2020-11-20 MED ORDER — IPRATROPIUM BROMIDE 0.02 % IN SOLN
0.5000 mg | Freq: Two times a day (BID) | RESPIRATORY_TRACT | Status: DC
  Administered 2020-11-21 – 2020-11-22 (×3): 0.5 mg via RESPIRATORY_TRACT
  Filled 2020-11-20 (×3): qty 2.5

## 2020-11-20 MED ORDER — RESOURCE INSTANT PROTEIN PO PWD PACKET
1.0000 | Freq: Three times a day (TID) | ORAL | Status: DC
  Administered 2020-11-20 – 2020-11-22 (×6): 6 g via ORAL
  Filled 2020-11-20 (×7): qty 6

## 2020-11-20 MED ORDER — LEVALBUTEROL HCL 0.63 MG/3ML IN NEBU
0.6300 mg | INHALATION_SOLUTION | Freq: Two times a day (BID) | RESPIRATORY_TRACT | Status: DC
  Administered 2020-11-21 – 2020-11-22 (×3): 0.63 mg via RESPIRATORY_TRACT
  Filled 2020-11-20 (×3): qty 3

## 2020-11-20 MED ORDER — LEVALBUTEROL HCL 0.63 MG/3ML IN NEBU
0.6300 mg | INHALATION_SOLUTION | Freq: Four times a day (QID) | RESPIRATORY_TRACT | Status: DC
  Administered 2020-11-20 (×2): 0.63 mg via RESPIRATORY_TRACT
  Filled 2020-11-20 (×2): qty 3

## 2020-11-20 NOTE — Progress Notes (Signed)
Initial Nutrition Assessment  DOCUMENTATION CODES:   Severe malnutrition in context of chronic illness,Underweight  INTERVENTION:   -Chocolate milk with each meal  -Daily snacks ordered  -Multivitamin with minerals daily  -Magic cup TID with meals, each supplement provides 290 kcal and 9 grams of protein  -Beneprotein with meals, each provides 25 kcals and 6g protein  -Encouraged small, frequent meals  -Reviewed non-meat protein options with patient  NUTRITION DIAGNOSIS:   Severe Malnutrition related to chronic illness as evidenced by severe fat depletion,severe muscle depletion.  GOAL:   Patient will meet greater than or equal to 90% of their needs  MONITOR:   PO intake,Supplement acceptance,Weight trends,Labs  REASON FOR ASSESSMENT:   Malnutrition Screening Tool    ASSESSMENT:   80 y.o. female with medical history significant of dementia, anxiety, depression, brain aneurysm, CVA, CKD 3, COPD, GERD, paroxysmal SVT, normocytic anemia, orthostatic hypotension, mitral valve prolapse who presents with ongoing weakness, confusion and lethargy.  Patient in room with no family at bedside. Pt reports she had some nausea this morning with reflux so she only ate 1/2 a piece of toast with jelly for breakfast. States she typically doesn't eat much for meals despite even more decreased appetite d/t coughing. Not a meat eater and avoids foods that cause reflux. Pt states she likes Boost but states they cause her to run to the bathroom within 30 minutes. Reviewed non-meat protein options which pt does like beans, peanut butter and eggs.  Will order Beneprotein, Magic cups, and daily snacks as pt does not tolerate other supplements well.  Per weight records, pt has lost 9 lbs since June 2021 (8% wt loss x 9.5 months, insignificant for time frame).  Medications: Remeron   Labs reviewed.  NUTRITION - FOCUSED PHYSICAL EXAM:  Flowsheet Row Most Recent Value  Orbital Region  Moderate depletion  Upper Arm Region Severe depletion  Thoracic and Lumbar Region Unable to assess  Buccal Region Severe depletion  Temple Region Severe depletion  Clavicle Bone Region Severe depletion  Clavicle and Acromion Bone Region Severe depletion  Scapular Bone Region Severe depletion  Dorsal Hand Severe depletion  Patellar Region Severe depletion  Anterior Thigh Region Unable to assess  Posterior Calf Region Severe depletion  Edema (RD Assessment) None  Hair Reviewed  Eyes Reviewed  [one eye with redness]  Mouth Reviewed  Skin Reviewed       Diet Order:   Diet Order            Diet Heart Room service appropriate? Yes; Fluid consistency: Thin  Diet effective now                 EDUCATION NEEDS:   Education needs have been addressed  Skin:  Skin Assessment: Reviewed RN Assessment  Last BM:  PTA  Height:   Ht Readings from Last 1 Encounters:  11/18/20 5\' 3"  (1.6 m)    Weight:   Wt Readings from Last 1 Encounters:  11/18/20 46.1 kg   BMI:  Body mass index is 18 kg/m.  Estimated Nutritional Needs:   Kcal:  1600-1800  Protein:  65-85g  Fluid:  1.8L/day   11/20/20, MS, RD, LDN Inpatient Clinical Dietitian Contact information available via Amion

## 2020-11-20 NOTE — Progress Notes (Addendum)
PROGRESS NOTE    Donne HazelBrenda M Jilek  ONG:[id number]:[id number] DOB: 04/10/1941 DOA: 11/18/2020 PCP: Richmond CampbellKaplan, Kristen W., PA-C    Chief Complaint  Patient presents with  . Weakness    Brief Narrative:  80 y.o.femalewith medical history significant ofdementia, anxiety, depression, brain aneurysm, CVA, CKD 3, COPD, GERD, paroxysmal SVT, normocytic anemia, orthostatic hypotension, mitral valve prolapse who presents with ongoing weakness, confusion and lethargy, found to have     Assessment & Plan:   Principal Problem:   CAP (community acquired pneumonia) Active Problems:   Paroxysmal SVT (supraventricular tachycardia) (HCC)   Hypokalemia   COPD (chronic obstructive pulmonary disease) (HCC)   History of CVA (cerebrovascular accident)   Anxiety   CKD (chronic kidney disease), stage III (HCC)   Sepsis due to pneumonia (HCC)   Hypertension  1 community-acquired pneumonia -Patient presented with generalized weakness, confusion, lethargy for a few days prior to admission in the setting of recent antibiotic treatment for possible pneumonia due to ongoing cough. -Chest x-ray on presentation suggestive of pneumonia. -COVID-19 PCR negative. -2D echo with normal EF, no wall motion abnormalities. -Currently afebrile. -Continue empiric IV Rocephin and azithromycin. -Add Mucinex twice daily. -Placed on Xopenex and Atrovent nebs. -Continue PPI.  2.  History of CVA -Stable. -Continue aspirin and Plavix for secondary stroke prophylaxis. -PT/OT.  3.  History of dementia -Stable. -Delirium precautions.  4.  Hypokalemia/hypomagnesemia -Repleted.  5.  Anemia of chronic disease -Patient with no overt bleeding. -Anemia panel consistent with anemia of chronic disease. -Follow.  Transfusion threshold hemoglobin < 7.  6.  Paroxysmal SVT -Continue home regimen of flecainide and metoprolol.  7.  Depression/anxiety -Continue Prozac, mirtazapine, Xanax as needed.  8.  Chronic kidney disease stage  IIIa -Stable.  Follow.  9.  COPD -Stable. -Xopenex, Atrovent nebs.  10.  Hypertension -Blood pressure borderline this morning. -Hold antihypertensive medications. -Resume Norvasc in the morning.     DVT prophylaxis: Lovenox Code Status: Full Family Communication: Updated patient.  No family at bedside. Disposition:   Status is: Inpatient    Dispo: The patient is from: Home              Anticipated d/c is to: Likely home with home health              Patient currently on IV antibiotics, not stable for discharge.   Difficult to place patient no       Consultants:   None  Procedures:   Chest x-ray 11/18/2020  2D echo 11/19/2020  Antimicrobials:   IV azithromycin 11/18/2020>>>>  IV Rocephin 11/18/2020>>>>>   Subjective: Patient laying in bed.  States some occasional left-sided back pain.  States feels congested however cannot get mucus up.  Denies any significant chest pain.  Denies any significant shortness of breath.  Overall feeling better than she did on admission.  Objective: Vitals:   11/20/20 1433 11/20/20 1556 11/20/20 1652 11/20/20 1933  BP:  (!) 156/81    Pulse:  78    Resp:  18    Temp:  97.6 F (36.4 C)    TempSrc:  Oral    SpO2: 98% 99% 100% 95%  Weight:      Height:        Intake/Output Summary (Last 24 hours) at 11/20/2020 2035 Last data filed at 11/20/2020 0446 Gross per 24 hour  Intake --  Output 250 ml  Net -250 ml   Filed Weights   11/18/20 1310 11/18/20 2110  Weight: 44.5 kg 46.1 kg  Examination:  General exam: Appears calm and comfortable  Respiratory system: Some scattered coarse breath sounds.  No wheezing.  Normal respiratory effort.  Speaking in full sentences.  Cardiovascular system: S1 & S2 heard, RRR. No JVD, murmurs, rubs, gallops or clicks. No pedal edema. Gastrointestinal system: Abdomen is nondistended, soft and nontender. No organomegaly or masses felt. Normal bowel sounds heard. Central nervous system:  Alert and oriented. No focal neurological deficits. Extremities: Symmetric 5 x 5 power. Skin: No rashes, lesions or ulcers Psychiatry: Judgement and insight appear normal. Mood & affect appropriate.     Data Reviewed: I have personally reviewed following labs and imaging studies  CBC: Recent Labs  Lab 11/18/20 1429 11/19/20 0544 11/20/20 0629  WBC 13.9* 10.7* 8.8  NEUTROABS 11.1*  --   --   HGB 12.5 10.1* 9.6*  HCT 37.1 30.8* 29.7*  MCV 90.5 92.2 91.7  PLT 487* 408* 425*    Basic Metabolic Panel: Recent Labs  Lab 11/18/20 1429 11/18/20 2150 11/19/20 0544 11/20/20 0629  NA 136  --  138 139  K 3.2*  --  2.8* 4.7  CL 97*  --  105 110  CO2 27  --  24 23  GLUCOSE 102*  --  78 104*  BUN 16  --  14 11  CREATININE 1.06*  --  0.90 0.89  CALCIUM 9.1  --  8.1* 9.0  MG  --  1.2*  --  2.0    GFR: Estimated Creatinine Clearance: 37.9 mL/min (by C-G formula based on SCr of 0.89 mg/dL).  Liver Function Tests: Recent Labs  Lab 11/18/20 1429 11/20/20 0629  AST 20 16  ALT 15 11  ALKPHOS 100 70  BILITOT 0.3 0.3  PROT 8.1 6.3*  ALBUMIN 2.7* 2.2*    CBG: No results for input(s): GLUCAP in the last 168 hours.   Recent Results (from the past 240 hour(s))  Resp Panel by RT-PCR (Flu A&B, Covid) Nasopharyngeal Swab     Status: None   Collection Time: 11/18/20  2:29 PM   Specimen: Nasopharyngeal Swab; Nasopharyngeal(NP) swabs in vial transport medium  Result Value Ref Range Status   SARS Coronavirus 2 by RT PCR NEGATIVE NEGATIVE Final    Comment: (NOTE) SARS-CoV-2 target nucleic acids are NOT DETECTED.  The SARS-CoV-2 RNA is generally detectable in upper respiratory specimens during the acute phase of infection. The lowest concentration of SARS-CoV-2 viral copies this assay can detect is 138 copies/mL. A negative result does not preclude SARS-Cov-2 infection and should not be used as the sole basis for treatment or other patient management decisions. A negative result  may occur with  improper specimen collection/handling, submission of specimen other than nasopharyngeal swab, presence of viral mutation(s) within the areas targeted by this assay, and inadequate number of viral copies(<138 copies/mL). A negative result must be combined with clinical observations, patient history, and epidemiological information. The expected result is Negative.  Fact Sheet for Patients:  BloggerCourse.com  Fact Sheet for Healthcare Providers:  SeriousBroker.it  This test is no t yet approved or cleared by the [place] FDA and  has been authorized for detection and/or diagnosis of SARS-CoV-2 by FDA under an Emergency Use Authorization (EUA). This EUA will remain  in effect (meaning this test can be used) for the duration of the COVID-19 declaration under Section 564(b)(1) of the Act, 21 U.S.C.section 360bbb-3(b)(1), unless the authorization is terminated  or revoked sooner.       Influenza A by PCR NEGATIVE NEGATIVE Final  Influenza B by PCR NEGATIVE NEGATIVE Final    Comment: (NOTE) The Xpert Xpress SARS-CoV-2/FLU/RSV plus assay is intended as an aid in the diagnosis of influenza from Nasopharyngeal swab specimens and should not be used as a sole basis for treatment. Nasal washings and aspirates are unacceptable for Xpert Xpress SARS-CoV-2/FLU/RSV testing.  Fact Sheet for Patients: BloggerCourse.com  Fact Sheet for Healthcare Providers: SeriousBroker.it  This test is not yet approved or cleared by the [place] FDA and has been authorized for detection and/or diagnosis of SARS-CoV-2 by FDA under an Emergency Use Authorization (EUA). This EUA will remain in effect (meaning this test can be used) for the duration of the COVID-19 declaration under Section 564(b)(1) of the Act, 21 U.S.C. section 360bbb-3(b)(1), unless the authorization is terminated  or revoked.  Performed at [facility], [address] [place], [place] [address]   Blood culture (routine x 2)     Status: None (Preliminary result)   Collection Time: 11/18/20  3:15 PM   Specimen: Right Antecubital; Blood  Result Value Ref Range Status   Specimen Description   Final    RIGHT ANTECUBITAL Performed at [facility], [address] [place], [place] [address]    Special Requests   Final    BOTTLES DRAWN AEROBIC AND ANAEROBIC Blood Culture adequate volume Performed at [facility], [address] [place], [place] [address]    Culture   Final    NO GROWTH 2 DAYS Performed at [facility], [address], [place], [place] [address]    Report Status PENDING  Incomplete  Blood culture (routine x 2)     Status: None (Preliminary result)   Collection Time: 11/18/20  3:20 PM   Specimen: Left Antecubital; Blood  Result Value Ref Range Status   Specimen Description   Final    LEFT ANTECUBITAL Performed at [facility], [address] [place], [place] [address]    Special Requests   Final    BOTTLES DRAWN AEROBIC AND ANAEROBIC Blood Culture results may not be optimal due to an excessive volume of blood received in culture bottles Performed at [facility], [address] [place], [place] [address]    Culture   Final    NO GROWTH 2 DAYS Performed at [facility], [address], [place], [place] [address]    Report Status PENDING  Incomplete         Radiology Studies: ECHOCARDIOGRAM COMPLETE  Result Date: 11/19/2020    ECHOCARDIOGRAM REPORT   Patient Name:   DONICA DEROUIN Date of Exam: 11/19/2020 Medical Rec #:  [id number]      Height:       63.0 in Accession #:    [id number]     Weight:       101.6 lb Date of Birth:  [birth year]       BSA:          1.450 m Patient Age:    79 years       BP:           136/82 mmHg Patient Gender: F              HR:           111 bpm. Exam Location:  Inpatient  Procedure: 2D Echo, 3D Echo, Cardiac Doppler and Color Doppler Indications:    I48.91* Unspeicified atrial fibrillation  History:  Patient has prior history of Echocardiogram examinations, most                 recent 01/29/2020. Abnormal ECG and AV block, COPD and Stroke;                 Risk Factors:Current Smoker and Hypertension.  Sonographer:    Sheralyn Boatman RDCS Referring Phys: [id number] STEPHEN K CHIU IMPRESSIONS  1. Left ventricular ejection fraction, by estimation, is 65 to 70%. The left ventricle has normal function. The left ventricle has no regional wall motion abnormalities. Left ventricular diastolic parameters are indeterminate.  2. Right ventricular systolic function is normal. The right ventricular size is normal. There is moderately elevated pulmonary artery systolic pressure.  3. A small pericardial effusion is present. The pericardial effusion is circumferential. There is no evidence of cardiac tamponade.  4. The mitral valve is normal in structure. Trivial mitral valve regurgitation. No evidence of mitral stenosis.  5. The aortic valve is normal in structure. Aortic valve regurgitation is trivial. No aortic stenosis is present.  6. The inferior vena cava is dilated in size with >50% respiratory variability, suggesting right atrial pressure of 8 mmHg. FINDINGS  Left Ventricle: Left ventricular ejection fraction, by estimation, is 65 to 70%. The left ventricle has normal function. The left ventricle has no regional wall motion abnormalities. The left ventricular internal cavity size was normal in size. There is  no left ventricular hypertrophy. Left ventricular diastolic parameters are indeterminate. Right Ventricle: The right ventricular size is normal. No increase in right ventricular wall thickness. Right ventricular systolic function is normal. There is moderately elevated pulmonary artery systolic pressure. The tricuspid regurgitant velocity is 2.94 m/s, and with an assumed right atrial pressure  of 15 mmHg, the estimated right ventricular systolic pressure is 49.6 mmHg. Left Atrium: Left atrial size was normal in size. Right Atrium: Right atrial size was normal in size. Pericardium: A small pericardial effusion is present. The pericardial effusion is circumferential. There is no evidence of cardiac tamponade. Mitral Valve: The mitral valve is normal in structure. Trivial mitral valve regurgitation. No evidence of mitral valve stenosis. Tricuspid Valve: The tricuspid valve is normal in structure. Tricuspid valve regurgitation is mild . No evidence of tricuspid stenosis. Aortic Valve: The aortic valve is normal in structure. Aortic valve regurgitation is trivial. No aortic stenosis is present. Pulmonic Valve: The pulmonic valve was normal in structure. Pulmonic valve regurgitation is not visualized. No evidence of pulmonic stenosis. Aorta: The aortic root is normal in size and structure. Venous: The inferior vena cava is dilated in size with greater than 50% respiratory variability, suggesting right atrial pressure of 8 mmHg. IAS/Shunts: No atrial level shunt detected by color flow Doppler.  LEFT VENTRICLE PLAX 2D LVIDd:         3.00 cm LVIDs:         1.70 cm LV PW:         0.99 cm LV IVS:        1.20 cm LVOT diam:     1.90 cm LV SV:         42 LV SV Index:   29 LVOT Area:     2.84 cm  LV Volumes (MOD) LV vol d, MOD A2C: 31.5 ml LV vol d, MOD A4C: 27.1 ml LV vol s, MOD A2C: 11.1 ml LV vol s, MOD A4C: 9.3 ml LV SV MOD A2C:     20.4 ml LV SV MOD A4C:  27.1 ml LV SV MOD BP:      20.6 ml RIGHT VENTRICLE            IVC RV S prime:     9.79 cm/s  IVC diam: 2.20 cm TAPSE (M-mode): 3.4 cm LEFT ATRIUM             Index       RIGHT ATRIUM           Index LA diam:        4.40 cm 3.03 cm/m  RA Area:     10.20 cm LA Vol (A2C):   30.5 ml 21.03 ml/m RA Volume:   15.60 ml  10.76 ml/m LA Vol (A4C):   23.2 ml 16.00 ml/m LA Biplane Vol: 25.8 ml 17.79 ml/m  AORTIC VALVE LVOT Vmax:   87.20 cm/s LVOT Vmean:  58.500 cm/s  LVOT VTI:    0.149 m  AORTA Ao Root diam: 3.10 cm MITRAL VALVE               TRICUSPID VALVE MV Area (PHT): 4.07 cm    TR Peak grad:   34.6 mmHg MV Decel Time: 187 msec    TR Vmax:        294.00 cm/s MV E velocity: 92.15 cm/s                            SHUNTS                            Systemic VTI:  0.15 m                            Systemic Diam: 1.90 cm Chilton Si MD Electronically signed by Chilton Si MD Signature Date/Time: 11/19/2020/4:30:21 PM    Final         Scheduled Meds: . clopidogrel  75 mg Oral Daily  . enoxaparin (LOVENOX) injection  40 mg Subcutaneous Q24H  . feeding supplement  237 mL Oral BID BM  . flecainide  50 mg Oral BID  . FLUoxetine  20 mg Oral BID  . guaiFENesin  1,200 mg Oral BID  . [START ON 11/21/2020] ipratropium  0.5 mg Nebulization BID  . [START ON 11/21/2020] levalbuterol  0.63 mg Nebulization BID  . metoprolol succinate  25 mg Oral QHS  . mirtazapine  7.5 mg Oral QHS  . multivitamin with minerals  1 tablet Oral Daily  . pantoprazole  40 mg Oral Daily  . protein supplement  1 Scoop Oral TID WC  . sodium chloride flush  3 mL Intravenous Q12H   Continuous Infusions: . sodium chloride 1,000 mL (11/19/20 0122)  . azithromycin 500 mg (11/19/20 2016)  . cefTRIAXone (ROCEPHIN)  IV 1 g (11/20/20 1745)     LOS: 1 day    Time spent: 35 minutes    Ramiro Harvest, MD [facility]   To contact the attending provider between 7A-7P or the covering provider during after hours 7P-7A, please log into the web site [email or web address] and access using universal [place] password for that web site. If you do not have the password, please call the hospital operator.  11/20/2020, 8:35 PM

## 2020-11-21 LAB — BASIC METABOLIC PANEL
Anion gap: 11 (ref 5–15)
BUN: 8 mg/dL (ref 8–23)
CO2: 22 mmol/L (ref 22–32)
Calcium: 9.1 mg/dL (ref 8.9–10.3)
Chloride: 104 mmol/L (ref 98–111)
Creatinine, Ser: 0.88 mg/dL (ref 0.44–1.00)
GFR, Estimated: >60 mL/min (ref >60)
Glucose, Bld: 94 mg/dL (ref 70–99)
Potassium: 4.1 mmol/L (ref 3.5–5.1)
Sodium: 137 mmol/L (ref 135–145)

## 2020-11-21 LAB — CBC
HCT: 32.9 % — ABNORMAL LOW (ref 36.0–46.0)
Hemoglobin: 10.4 g/dL — ABNORMAL LOW (ref 12.0–15.0)
MCH: 29.8 pg (ref 26.0–34.0)
MCHC: 31.6 g/dL (ref 30.0–36.0)
MCV: 94.3 fL (ref 80.0–100.0)
Platelets: 441 10*3/uL — ABNORMAL HIGH (ref 150–400)
RBC: 3.49 MIL/uL — ABNORMAL LOW (ref 3.87–5.11)
RDW: 13 % (ref 11.5–15.5)
WBC: 8 10*3/uL (ref 4.0–10.5)
nRBC: 0 % (ref 0.0–0.2)

## 2020-11-21 LAB — MAGNESIUM: Magnesium: 1.4 mg/dL — ABNORMAL LOW (ref 1.7–2.4)

## 2020-11-21 MED ORDER — CEFDINIR 300 MG PO CAPS
300.0000 mg | ORAL_CAPSULE | Freq: Two times a day (BID) | ORAL | Status: DC
  Administered 2020-11-21 – 2020-11-22 (×2): 300 mg via ORAL
  Filled 2020-11-21 (×3): qty 1

## 2020-11-21 MED ORDER — AMLODIPINE BESYLATE 5 MG PO TABS
5.0000 mg | ORAL_TABLET | Freq: Every day | ORAL | Status: DC
  Administered 2020-11-21 – 2020-11-22 (×2): 5 mg via ORAL
  Filled 2020-11-21 (×2): qty 1

## 2020-11-21 MED ORDER — AZITHROMYCIN 250 MG PO TABS
500.0000 mg | ORAL_TABLET | Freq: Every day | ORAL | Status: DC
  Administered 2020-11-21: 500 mg via ORAL
  Filled 2020-11-21: qty 2

## 2020-11-21 MED ORDER — MAGNESIUM SULFATE 4 GM/100ML IV SOLN
4.0000 g | Freq: Once | INTRAVENOUS | Status: AC
  Administered 2020-11-21: 4 g via INTRAVENOUS
  Filled 2020-11-21: qty 100

## 2020-11-21 NOTE — Progress Notes (Signed)
PROGRESS NOTE    Barbara Hill  KGU:[id number] DOB: 1941-02-04 DOA: 11/18/2020 PCP: Richmond Campbell., PA-C    Chief Complaint  Patient presents with  . Weakness    Brief Narrative:  80 y.o.femalewith medical history significant ofdementia, anxiety, depression, brain aneurysm, CVA, CKD 3, COPD, GERD, paroxysmal SVT, normocytic anemia, orthostatic hypotension, mitral valve prolapse who presents with ongoing weakness, confusion and lethargy, found to have     Assessment & Plan:   Principal Problem:   CAP (community acquired pneumonia) Active Problems:   Paroxysmal SVT (supraventricular tachycardia) (HCC)   Hypokalemia   COPD (chronic obstructive pulmonary disease) (HCC)   History of CVA (cerebrovascular accident)   Anxiety   CKD (chronic kidney disease), stage III (HCC)   Sepsis due to pneumonia (HCC)   Hypertension  1 community-acquired pneumonia -Patient presented with generalized weakness, confusion, lethargy for a few days prior to admission in the setting of recent antibiotic treatment for possible pneumonia due to ongoing cough. -Chest x-ray on presentation suggestive of pneumonia. -COVID-19 PCR negative. -2D echo with normal EF, no wall motion abnormalities. -Currently afebrile. -Continue IV Rocephin and transition to oral Vantin.  Continue oral azithromycin.  -Continue Mucinex, Xopenex and Atrovent nebs, PPI.  2.  History of CVA -Stable. -Continue Plavix, aspirin for secondary stroke prophylaxis.   -PT/OT.   3.  History of dementia -Stable. -Delirium precautions.  4.  Hypokalemia/hypomagnesemia -Potassium at 4.1.  Magnesium at 1.4.  Magnesium sulfate 4 g IV x1.   -Repeat labs in the morning.  5.  Anemia of chronic disease -Patient with no overt bleeding. -Anemia panel consistent with anemia of chronic disease. -Follow.  Transfusion threshold hemoglobin < 7.  6.  Paroxysmal SVT -Continue home regimen Toprol-XL, flecainide.    7.   Depression/anxiety -Stable.  Continue Prozac, mirtazapine, Xanax as needed.    8.  Chronic kidney disease stage IIIa -Stable.  Follow.  9.  COPD -Stable.   -Continue Xopenex, Atrovent nebs.  10.  Hypertension -Blood pressure was soft however has improved.   -Resume home regimen Norvasc 5 mg daily.  Continue Toprol-XL.     DVT prophylaxis: Lovenox Code Status: Full Family Communication: Updated patient.  No family at bedside. Disposition:   Status is: Inpatient    Dispo: The patient is from: Home              Anticipated d/c is to: Likely home with home health              Patient currently on IV antibiotics, not stable for discharge.   Difficult to place patient no       Consultants:   None  Procedures:   Chest x-ray 11/18/2020  2D echo 11/19/2020  Antimicrobials:   IV azithromycin 11/18/2020>>>> oral azithromycin 11/21/2020>>>> 11/23/2020  IV Rocephin 11/18/2020>>>>> 11/21/2020  Oral Vantin 11/22/2020 >>>>   Subjective: Patient sleeping but arousable.  Stated was up most of the night with some loose stools x3 episodes, none this morning.  Denies any chest pain.  States shortness of breath is improving.  Overall feeling better.  Objective: Vitals:   11/21/20 0545 11/21/20 1037 11/21/20 1047 11/21/20 1412  BP: (!) 167/76   124/67  Pulse: 64   65  Resp: 18   17  Temp: 97.7 F (36.5 C)   98 F (36.7 C)  TempSrc: Oral   Oral  SpO2: 94% 94% 100% 97%  Weight:      Height:  Intake/Output Summary (Last 24 hours) at 11/21/2020 1730 Last data filed at 11/21/2020 1639 Gross per 24 hour  Intake --  Output 1400 ml  Net -1400 ml   Filed Weights   11/18/20 1310 11/18/20 2110  Weight: 44.5 kg 46.1 kg    Examination:  General exam: : NAD Respiratory system: Improved coarse breath sounds.  No wheezes, no rhonchi.  Speaking in full sentences.  Normal respiratory effort.  Cardiovascular system: Regular rate and rhythm no murmurs rubs or gallops.  No JVD.   No lower extremity edema.  Gastrointestinal system: Abdomen soft, nontender, nondistended, positive bowel sounds.  No rebound.  No guarding. Central nervous system: Alert and oriented. No focal neurological deficits. Extremities: Symmetric 5 x 5 power. Skin: No rashes, lesions or ulcers Psychiatry: Judgement and insight appear normal. Mood & affect appropriate.  Data Reviewed: I have personally reviewed following labs and imaging studies  CBC: Recent Labs  Lab 11/18/20 1429 11/19/20 0544 11/20/20 0629 11/21/20 0552  WBC 13.9* 10.7* 8.8 8.0  NEUTROABS 11.1*  --   --   --   HGB 12.5 10.1* 9.6* 10.4*  HCT 37.1 30.8* 29.7* 32.9*  MCV 90.5 92.2 91.7 94.3  PLT 487* 408* 425* 441*    Basic Metabolic Panel: Recent Labs  Lab 11/18/20 1429 11/18/20 2150 11/19/20 0544 11/20/20 0629 11/21/20 0552  NA 136  --  138 139 137  K 3.2*  --  2.8* 4.7 4.1  CL 97*  --  105 110 104  CO2 27  --  24 23 22   GLUCOSE 102*  --  78 104* 94  BUN 16  --  14 11 8   CREATININE 1.06*  --  0.90 0.89 0.88  CALCIUM 9.1  --  8.1* 9.0 9.1  MG  --  1.2*  --  2.0 1.4*    GFR: Estimated Creatinine Clearance: 38.3 mL/min (by C-G formula based on SCr of 0.88 mg/dL).  Liver Function Tests: Recent Labs  Lab 11/18/20 1429 11/20/20 0629  AST 20 16  ALT 15 11  ALKPHOS 100 70  BILITOT 0.3 0.3  PROT 8.1 6.3*  ALBUMIN 2.7* 2.2*    CBG: No results for input(s): GLUCAP in the last 168 hours.   Recent Results (from the past 240 hour(s))  Resp Panel by RT-PCR (Flu A&B, Covid) Nasopharyngeal Swab     Status: None   Collection Time: 11/18/20  2:29 PM   Specimen: Nasopharyngeal Swab; Nasopharyngeal(NP) swabs in vial transport medium  Result Value Ref Range Status   SARS Coronavirus 2 by RT PCR NEGATIVE NEGATIVE Final    Comment: (NOTE) SARS-CoV-2 target nucleic acids are NOT DETECTED.  The SARS-CoV-2 RNA is generally detectable in upper respiratory specimens during the acute phase of infection. The  lowest concentration of SARS-CoV-2 viral copies this assay can detect is 138 copies/mL. A negative result does not preclude SARS-Cov-2 infection and should not be used as the sole basis for treatment or other patient management decisions. A negative result may occur with  improper specimen collection/handling, submission of specimen other than nasopharyngeal swab, presence of viral mutation(s) within the areas targeted by this assay, and inadequate number of viral copies(<138 copies/mL). A negative result must be combined with clinical observations, patient history, and epidemiological information. The expected result is Negative.  Fact Sheet for Patients:  BloggerCourse.comhttps://www.fda.gov/media/152166/download  Fact Sheet for Healthcare Providers:  SeriousBroker.ithttps://www.fda.gov/media/152162/download  This test is no t yet approved or cleared by the [place] FDA and  has been authorized  for detection and/or diagnosis of SARS-CoV-2 by FDA under an Emergency Use Authorization (EUA). This EUA will remain  in effect (meaning this test can be used) for the duration of the COVID-19 declaration under Section 564(b)(1) of the Act, 21 U.S.C.section 360bbb-3(b)(1), unless the authorization is terminated  or revoked sooner.       Influenza A by PCR NEGATIVE NEGATIVE Final   Influenza B by PCR NEGATIVE NEGATIVE Final    Comment: (NOTE) The Xpert Xpress SARS-CoV-2/FLU/RSV plus assay is intended as an aid in the diagnosis of influenza from Nasopharyngeal swab specimens and should not be used as a sole basis for treatment. Nasal washings and aspirates are unacceptable for Xpert Xpress SARS-CoV-2/FLU/RSV testing.  Fact Sheet for Patients: BloggerCourse.com  Fact Sheet for Healthcare Providers: SeriousBroker.it  This test is not yet approved or cleared by the [place] FDA and has been authorized for detection and/or diagnosis of SARS-CoV-2 by FDA under  an Emergency Use Authorization (EUA). This EUA will remain in effect (meaning this test can be used) for the duration of the COVID-19 declaration under Section 564(b)(1) of the Act, 21 U.S.C. section 360bbb-3(b)(1), unless the authorization is terminated or revoked.  Performed at [facility], [address] [place], [place] [address]   Blood culture (routine x 2)     Status: None (Preliminary result)   Collection Time: 11/18/20  3:15 PM   Specimen: Right Antecubital; Blood  Result Value Ref Range Status   Specimen Description   Final    RIGHT ANTECUBITAL Performed at [facility], [address] [place], [place] [address]    Special Requests   Final    BOTTLES DRAWN AEROBIC AND ANAEROBIC Blood Culture adequate volume Performed at [facility], [address] [place], [place] [address]    Culture   Final    NO GROWTH 3 DAYS Performed at [facility], [address] [place], [place] [address]    Report Status PENDING  Incomplete  Blood culture (routine x 2)     Status: None (Preliminary result)   Collection Time: 11/18/20  3:20 PM   Specimen: Left Antecubital; Blood  Result Value Ref Range Status   Specimen Description   Final    LEFT ANTECUBITAL Performed at [facility], [address] [place], [place] [address]    Special Requests   Final    BOTTLES DRAWN AEROBIC AND ANAEROBIC Blood Culture results may not be optimal due to an excessive volume of blood received in culture bottles Performed at [facility], [address] [place], [place] [address]    Culture   Final    NO GROWTH 3 DAYS Performed at [facility], [address], [place], [place] [address]    Report Status PENDING  Incomplete         Radiology Studies: No results found.      Scheduled Meds: . amLODipine  5 mg Oral Daily  . azithromycin  500 mg Oral QHS  . clopidogrel  75 mg Oral Daily  . enoxaparin (LOVENOX) injection   40 mg Subcutaneous Q24H  . feeding supplement  237 mL Oral BID BM  . flecainide  50 mg Oral BID  . FLUoxetine  20 mg Oral BID  . guaiFENesin  1,200 mg Oral BID  . ipratropium  0.5 mg Nebulization BID  . levalbuterol  0.63 mg Nebulization BID  . metoprolol succinate  25 mg Oral QHS  . mirtazapine  7.5 mg Oral QHS  . multivitamin with minerals  1 tablet Oral Daily  . pantoprazole  40 mg Oral Daily  . protein supplement  1 Scoop Oral TID WC  . sodium chloride flush  3 mL Intravenous Q12H   Continuous Infusions: . sodium chloride 1,000 mL (11/19/20 0122)  . cefTRIAXone (ROCEPHIN)  IV 1 g (11/21/20 1721)     LOS: 2 days    Time spent: 35 minutes    Ramiro Harvest, MD [facility]   To contact the attending provider between 7A-7P or the covering provider during after hours 7P-7A, please log into the web site [email or web address] and access using universal [place] password for that web site. If you do not have the password, please call the hospital operator.  11/21/2020, 5:30 PM

## 2020-11-21 NOTE — Progress Notes (Signed)
Physical Therapy Treatment Patient Details Name: Barbara Hill MRN: [id number] DOB: [birth year] Today's Date: 11/21/2020    History of Present Illness Pt is 80 y.o. female admitted on 2/38/22 with PNE.  She has medical history significant of dementia, anxiety, depression, brain aneurysm, CVA, CKD 3, COPD, GERD, paroxysmal SVT, normocytic anemia, orthostatic hypotension, mitral valve prolapse.    PT Comments    Pt was limited today due to c/o weakness in standing - pt found to have orthostatic hypotension (notified RN and MD).  Additionally, pt's O2 sats decreased with activity to 88% on RA but recovered quickly <10 sec - unable to assess with O2 due to fatigue and orthostatic hypotension.   Pt requiring increased cues for posture, RW use, and education on orthostatic hypotension.  Vitals stable in sitting encouraged sitting up.  Pt does have 24 hr assist -continue plan of care.  Pt did require increased time for treatments to address ADLs, orthostatic hypotension, and O2 assessment.    Orthostatic BP: Sitting: 127/72  Standing 74/55 symptomatic Standing 3 mins with encouragement to march in place: up to 91/72 but still symptomatic Return to sitting 132/67  HR: consistently 68-73 bpm  O2 sats 93% on RA rest O2 sats 88% on RA activity Unable to assess further O2 needs due to fatigue/orthostatic   Follow Up Recommendations  Home health PT;Supervision/Assistance - 24 hour     Equipment Recommendations  Rolling walker with 5" wheels    Recommendations for Other Services       Precautions / Restrictions Precautions Precautions: Fall Precaution Comments: orthostatic BP    Mobility  Bed Mobility Overal bed mobility: Needs Assistance Bed Mobility: Supine to Sit;Sit to Supine     Supine to sit: HOB elevated;Min guard     General bed mobility comments: increased time; use of rails    Transfers Overall transfer level: Needs assistance Equipment used: Rolling walker (2  wheeled);1 person hand held assist Transfers: Sit to/from Stand Sit to Stand: Min assist         General transfer comment: Sit to stand x 5 throughout session with min A to rise and steady; cues for hand placement and posture  Ambulation/Gait Ambulation/Gait assistance: Min assist Gait Distance (Feet): 15 Feet (15'x2) Assistive device: Rolling walker (2 wheeled);IV Pole Gait Pattern/deviations: Step-to pattern;Decreased stride length;Trunk flexed Gait velocity: decr   General Gait Details: Pt declining RW to bathroom but required use of IV pole, HHA, and with extreme trunk lean.  Encouraged RW for back to chair with some improvement in posture but still needed frequent cues for posture.  Requiring min A to steady and cues to complete transfers before sitting.  Pt with c/o weakness limiting distance - see general comments for vitals/pt with orthostatic bp   Stairs             Wheelchair Mobility    Modified Rankin (Stroke Patients Only)       Balance Overall balance assessment: Needs assistance Sitting-balance support: Bilateral upper extremity supported Sitting balance-Leahy Scale: Poor Sitting balance - Comments: requiring support of UE     Standing balance-Leahy Scale: Poor Standing balance comment: requiring RW, min guard, and with forward trunk                            Cognition Arousal/Alertness: Awake/alert Behavior During Therapy: WFL for tasks assessed/performed Overall Cognitive Status: History of cognitive impairments - at baseline  General Comments: short term memory deficits, h/o dementia      Exercises      General Comments  Pt ambulated to bathroom and had BM requiring assist for ADLs.  With ADLs in standing pt having c/o weakness and needing to sit.  Once able to get pt back to recliner assessed orthostatic hypotension.  Orthostatic BP: Sitting: 127/72  Standing 74/55  symptomatic Standing 3 mins with encouragement to march in place: up to 91/72 but still symptomatic Return to sitting 132/67  HR: consistently 68-73 bpm  O2 sats 93% on RA rest O2 sats 88% on RA activity Unable to assess further O2 needs due to fatigue/orthostatic  Educated on need for assistance at home, benefits of sitting, orthostatic hypotension.      Pertinent Vitals/Pain Pain Assessment: No/denies pain    Home Living                      Prior Function            PT Goals (current goals can now be found in the care plan section) Acute Rehab PT Goals Patient Stated Goal: DC home PT Goal Formulation: With patient/family Time For Goal Achievement: 12/03/20 Potential to Achieve Goals: Good Progress towards PT goals: Progressing toward goals    Frequency    Min 3X/week      PT Plan Current plan remains appropriate    Co-evaluation              AM-PAC PT "6 Clicks" Mobility   Outcome Measure  Help needed turning from your back to your side while in a flat bed without using bedrails?: A Little Help needed moving from lying on your back to sitting on the side of a flat bed without using bedrails?: A Little Help needed moving to and from a bed to a chair (including a wheelchair)?: A Little Help needed standing up from a chair using your arms (e.g., wheelchair or bedside chair)?: A Little Help needed to walk in hospital room?: A Little Help needed climbing 3-5 steps with a railing? : A Lot 6 Click Score: 17    End of Session Equipment Utilized During Treatment: Gait belt Activity Tolerance: Other (comment) (limited by orthostatic hypotension) Patient left: in chair;with call bell/phone within reach;with nursing/sitter in room;with family/visitor present (retrieved new batteries for chair alarm - RN now in room with pt, gave batteries to RN to replace) Nurse Communication: Mobility status PT Visit Diagnosis: Difficulty in walking, not elsewhere  classified (R26.2)     Time: 1157-2620 PT Time Calculation (min) (ACUTE ONLY): 41 min  Charges:  $Gait Training: 8-22 mins $Therapeutic Activity: 23-37 mins                     Anise Salvo, PT Acute Rehab Services Pager [phone] [facility] [phone]     Rayetta Humphrey 11/21/2020, 2:09 PM

## 2020-11-22 LAB — BASIC METABOLIC PANEL
Anion gap: 8 (ref 5–15)
BUN: 9 mg/dL (ref 8–23)
CO2: 26 mmol/L (ref 22–32)
Calcium: 9.4 mg/dL (ref 8.9–10.3)
Chloride: 102 mmol/L (ref 98–111)
Creatinine, Ser: 0.86 mg/dL (ref 0.44–1.00)
GFR, Estimated: >60 mL/min (ref >60)
Glucose, Bld: 97 mg/dL (ref 70–99)
Potassium: 3.9 mmol/L (ref 3.5–5.1)
Sodium: 136 mmol/L (ref 135–145)

## 2020-11-22 LAB — CBC WITH DIFFERENTIAL/PLATELET
Abs Immature Granulocytes: 0.05 10*3/uL (ref 0.00–0.07)
Basophils Absolute: 0.1 10*3/uL (ref 0.0–0.1)
Basophils Relative: 1 %
Eosinophils Absolute: 0.3 10*3/uL (ref 0.0–0.5)
Eosinophils Relative: 3 %
HCT: 33.6 % — ABNORMAL LOW (ref 36.0–46.0)
Hemoglobin: 11 g/dL — ABNORMAL LOW (ref 12.0–15.0)
Immature Granulocytes: 1 %
Lymphocytes Relative: 16 %
Lymphs Abs: 1.5 10*3/uL (ref 0.7–4.0)
MCH: 30 pg (ref 26.0–34.0)
MCHC: 32.7 g/dL (ref 30.0–36.0)
MCV: 91.6 fL (ref 80.0–100.0)
Monocytes Absolute: 0.9 10*3/uL (ref 0.1–1.0)
Monocytes Relative: 10 %
Neutro Abs: 6.4 10*3/uL (ref 1.7–7.7)
Neutrophils Relative %: 69 %
Platelets: 492 10*3/uL — ABNORMAL HIGH (ref 150–400)
RBC: 3.67 MIL/uL — ABNORMAL LOW (ref 3.87–5.11)
RDW: 12.9 % (ref 11.5–15.5)
WBC: 9.2 10*3/uL (ref 4.0–10.5)
nRBC: 0 % (ref 0.0–0.2)

## 2020-11-22 LAB — MAGNESIUM: Magnesium: 1.9 mg/dL (ref 1.7–2.4)

## 2020-11-22 MED ORDER — GUAIFENESIN ER 600 MG PO TB12: 1200.0000 mg | ORAL_TABLET | Freq: Two times a day (BID) | ORAL | 0 refills | Status: AC

## 2020-11-22 MED ORDER — LEVALBUTEROL TARTRATE 45 MCG/ACT IN AERO: 2.0000 | INHALATION_SPRAY | RESPIRATORY_TRACT | 2 refills | Status: AC | PRN

## 2020-11-22 MED ORDER — BUDESONIDE-FORMOTEROL FUMARATE 80-4.5 MCG/ACT IN AERO: 2.0000 | INHALATION_SPRAY | Freq: Two times a day (BID) | RESPIRATORY_TRACT | 1 refills | Status: AC

## 2020-11-22 MED ORDER — CEFDINIR 300 MG PO CAPS
300.0000 mg | ORAL_CAPSULE | Freq: Two times a day (BID) | ORAL | 0 refills | Status: AC
Start: 2020-11-22 — End: 2020-11-25

## 2020-11-22 MED ORDER — AZITHROMYCIN 250 MG PO TABS
500.0000 mg | ORAL_TABLET | Freq: Once | ORAL | Status: AC
  Administered 2020-11-22: 500 mg via ORAL
  Filled 2020-11-22: qty 2

## 2020-11-22 NOTE — Care Management Important Message (Signed)
Important Message  Patient Details IM Letter given to the Patient. Name: Barbara Hill MRN: [id number] Date of Birth: [birth year]   Medicare Important Message Given:  Yes     Caren Macadam 11/22/2020, 10:10 AM

## 2020-11-22 NOTE — TOC Initial Note (Signed)
Transition of Care [facility]) - Initial/Assessment Note    Patient Details  Name: Barbara Hill MRN: [id number] Date of Birth: 06/22/1941  Transition of Care [facility]) CM/SW Contact:    Kalel Harty, Meriam Sprague, RN Phone Number: 11/22/2020, 11:28 AM  Clinical Narrative:                   Expected Discharge Plan: Home w Home Health Services Barriers to Discharge: No Barriers Identified   Patient Goals and CMS Choice Patient states their goals for this hospitalization and ongoing recovery are:: To go home CMS Medicare.gov Compare Post Acute Care list provided to:: Patient Choice offered to / list presented to : Patient  Expected Discharge Plan and Services Expected Discharge Plan: Home w Home Health Services   Discharge Planning Services: CM Consult Post Acute Care Choice: Home Health Living arrangements for the past 2 months: Single Family Home                 DME Arranged: Walker rolling DME Agency: Other - Comment Electrical engineer) Date DME Agency Contacted: 11/22/20 Time DME Agency Contacted: 1127 Representative spoke with at DME Agency: Vaughan Basta HH Arranged: PT HH Agency: [facility] Date [facility] Agency Contacted: 11/22/20 Time HH Agency Contacted: 1127 Representative spoke with at [facility] Agency: Kandee Keen  Prior Living Arrangements/Services Living arrangements for the past 2 months: Single Family Home Lives with:: Adult Children Patient language and need for interpreter reviewed:: Yes Do you feel safe going back to the place where you live?: Yes      Need for Family Participation in Patient Care: Yes (Comment) Care giver support system in place?: Yes (comment)   Criminal Activity/Legal Involvement Pertinent to Current Situation/Hospitalization: No - Comment as needed  Activities of Daily Living Home Assistive Devices/Equipment: Walker (specify type),Eyeglasses,Bedside commode/3-in-1 ADL Screening (condition at time of admission) Patient's cognitive ability adequate to safely complete  daily activities?: Yes Is the patient deaf or have difficulty hearing?: Yes Does the patient have difficulty seeing, even when wearing glasses/contacts?: No Does the patient have difficulty concentrating, remembering, or making decisions?: Yes Patient able to express need for assistance with ADLs?: Yes Does the patient have difficulty dressing or bathing?: Yes Independently performs ADLs?: No (pt states she does everything by herself) Communication: Independent Dressing (OT): Independent Is this a change from baseline?: Pre-admission baseline Grooming: Independent Feeding: Independent Bathing: Independent Toileting: Independent In/Out Bed: Independent Walks in Home: Independent Does the patient have difficulty walking or climbing stairs?: Yes Weakness of Legs: None Weakness of Arms/Hands: None  Permission Sought/Granted Permission sought to share information with : Oceanographer granted to share information with : Yes, Verbal Permission Granted     Permission granted to share info w AGENCY: [employer]        Emotional Assessment Appearance:: Appears stated age Attitude/Demeanor/Rapport: Gracious Affect (typically observed): Calm Orientation: : Oriented to Self,Oriented to Place,Oriented to  Time,Oriented to Situation Alcohol / Substance Use: Not Applicable    Admission diagnosis:  CAP (community acquired pneumonia) [J18.9] Community acquired pneumonia, unspecified laterality [J18.9] Sepsis due to pneumonia (HCC) [J18.9, A41.9] Patient Active Problem List   Diagnosis Date Noted  . Hypertension   . Sepsis due to pneumonia (HCC) 11/19/2020  . CAP (community acquired pneumonia) 11/18/2020  . TIA (transient ischemic attack) 09/17/2020  . Protein-calorie malnutrition, severe 02/05/2020  . Ischemic leg 01/25/2020  . COPD (chronic obstructive pulmonary disease) (HCC) 01/25/2020  . Normocytic anemia 01/25/2020  . History of CVA  (  cerebrovascular accident) 01/25/2020  . Anxiety 01/25/2020  . CKD (chronic kidney disease), stage III (HCC) 01/25/2020  . 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 Brain aneurysm 06/29/2016  . Lumbar contusion   . Orthostatic hypotension   . First degree AV block   . Hypokalemia   . Syncope 04/10/2016  . Paroxysmal SVT (supraventricular tachycardia) (HCC) 04/21/2011  . 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 Tobacco use disorder 04/21/2011   PCP:  Richmond Campbell., PA-C Pharmacy:   [facility] [phone] - [place], [place] - [address] [facility] [address] [place] [address] Phone: [phone] Fax: [phone]  [facility] [place], [place] - [address] [place] [address] Phone: [phone] Fax: [phone]     Social Determinants of Health (SDOH) Interventions    Readmission Risk Interventions Readmission Risk Prevention Plan 11/22/2020  Transportation Screening Complete  PCP or Specialist Appt within 3-5 Days Complete  HRI or Home Care Consult Complete  Social Work Consult for Recovery Care Planning/Counseling Complete  Palliative Care Screening Not Applicable  Medication Review Oceanographer) Complete  Some recent data might be hidden

## 2020-11-22 NOTE — Discharge Summary (Addendum)
Physician Discharge Summary  [name]:[id number] DOB: 1941-05-18 DOA: 11/18/2020  PCP: Barbara Hill., PA-C  Admit date: 11/18/2020 Discharge date: 11/22/2020  Time spent: 60 minutes  Recommendations for Outpatient Follow-up:  1. Follow-up with Barbara Hill., PA-C in 2 weeks.  On follow-up pneumonia need to be followed up upon.  Patient will need a basic metabolic profile, magnesium, CBC done to follow-up on electrolytes, renal function, H&H. 2. Patient will be discharged home with home health therapies.   Discharge Diagnoses:  Principal Problem:   CAP (community acquired pneumonia) Active Problems:   Paroxysmal SVT (supraventricular tachycardia) (HCC)   Hypokalemia   COPD (chronic obstructive pulmonary disease) (HCC)   History of CVA (cerebrovascular accident)   Anxiety   CKD (chronic kidney disease), stage III (HCC)   Sepsis due to pneumonia (HCC)   Hypertension   Discharge Condition: Stable and improved  Diet recommendation: Heart healthy  Filed Weights   11/18/20 1310 11/18/20 2110  Weight: 44.5 kg 46.1 kg    History of present illness:  HPI per Dr. Gillermina Phy Barbara Hill is a 80 y.o. female with medical history significant of dementia, anxiety, depression, brain aneurysm, CVA, CKD 3, COPD, GERD, paroxysmal SVT, normocytic anemia, orthostatic hypotension, mitral valve prolapse who presented with ongoing weakness, confusion and lethargy.             Due to patient's dementia some history obtained with the assistance of chart review and family.  Patient has had ongoing cough for a few weeks.  Was started on antibiotics by PCP last week and finished 10 day course yesterday, unknown what antibiotic.  Patient had continued to have a cough and reportedly had a fever to 101 at home.  She is also been experiencing increased weakness, confusion and lethargy for the past few days.             Per family, patient has reported some dysuria. Patient denied chest pain,  shortness of breath, abdominal pain, constipation, diarrhea, nausea, vomiting.  ED Course: Vital signs in the ED were stable.  Lab work-up showed CMP with potassium 3.2, chloride 97, albumin 2.7.  CBC showed leukocytosis to 13.4 and platelets of 47.  Lactic acid normal, respiratory panel flu COVID negative, urinalysis showing only moderate hemoglobin, blood cultures pending.  Chest x-ray showed bibasilar atelectasis versus infiltration with a small left pleural effusion.  Patient was given antibiotics, IV fluids in the ED.  Hospital Course:  1 community-acquired pneumonia/sepsis ruled out -Patient presented with generalized weakness, confusion, lethargy for a few days prior to admission in the setting of recent antibiotic treatment for possible pneumonia due to ongoing cough. -Chest x-ray on presentation suggestive of pneumonia. -COVID-19 PCR negative. -2D echo with normal EF, no wall motion abnormalities. -Patient placed empirically on IV Rocephin and azithromycin as well as started on Mucinex, Xopenex, Atrovent nebs and PPI.   -Patient improved clinically and completed a 5-day course of azithromycin during the hospitalization.   -IV Rocephin was transitioned to oral Omnicef.   -Patient was discharged home on 3 more days of Omnicef to complete a 7-day course of treatment.   -Patient will also be discharged home on Symbicort and Xopenex MDIs.   -Outpatient follow-up with PCP.   2.  History of CVA -Stable. -Patient maintained on home regimen of aspirin, Plavix for secondary stroke prophylaxis. -Patient seen by PT/OT and will be discharge home on home health therapies.  3.  History of dementia -Stable. -Delirium precautions.  4.  Hypokalemia/hypomagnesemia -Electrolytes were repleted during her hospitalization.  Outpatient follow-up with PCP.  5.  Anemia of chronic disease -Patient with no overt bleeding. -Anemia panel consistent with anemia of chronic disease. -Hemoglobin remained  stable.  Outpatient follow-up with PCP.   6.  Paroxysmal SVT -Patient maintained on home regimen Toprol-XL, flecainide.    7.  Depression/anxiety -Stable.  Continue Prozac, mirtazapine, Xanax as needed.    8.  Chronic kidney disease stage IIIa -Stable.    9.  COPD -Remained stable.  Patient maintained on Xopenex and Atrovent nebs.   -Patient was discharged home on Symbicort and Xopenex MDI.   -Tobacco cessation stressed to patient.   -Outpatient follow-up with PCP.  10.  Hypertension -Blood pressure was soft for admission discharge home antihypertensive medications held.   -Blood pressure improved on home regimen of Norvasc and Toprol-XL resumed.  Outpatient follow-up.     Procedures:  Chest x-ray 11/18/2020  2D echo 11/19/2020    Consultations:  None  Discharge Exam: Vitals:   11/22/20 0842 11/22/20 1300  BP:  116/64  Pulse:  68  Resp:  18  Temp:  98 F (36.7 C)  SpO2: 90% 98%    General: NAD Cardiovascular: RRR Respiratory: CTAB  Discharge Instructions   Discharge Instructions    Diet - low sodium heart healthy   Complete by: As directed    Increase activity slowly   Complete by: As directed      Allergies as of 11/22/2020      Reactions   Dobutamine Other (See Comments)   Heart beating hard with CP, neck pain, and weakness   Epinephrine Other (See Comments)   Fast heart beat   Aspirin Nausea And Vomiting   History of stomach ulcer   Clarithromycin Other (See Comments)   Yeast infection   Doxycycline Other (See Comments)   Makes stomach hurt   Excedrin Extra Strength [aspirin-acetaminophen-caffeine] Nausea And Vomiting   Penicillins Other (See Comments)   Yeast infection   Prednisone Other (See Comments)   Shakes   Propoxyphene Nausea And Vomiting      Medication List    TAKE these medications   acetaminophen 650 MG CR tablet Commonly known as: TYLENOL Take 650 mg by mouth every 8 (eight) hours as needed for pain.    ALPRAZolam 0.5 MG tablet Commonly known as: XANAX Take 1 tablet (0.5 mg total) by mouth 3 (three) times daily as needed for anxiety. What changed: how much to take   amLODipine 5 MG tablet Commonly known as: NORVASC Take 1 tablet (5 mg total) by mouth daily.   budesonide-formoterol 80-4.5 MCG/ACT inhaler Commonly known as: Symbicort Inhale 2 puffs into the lungs 2 (two) times daily.   butalbital-acetaminophen-caffeine 50-325-40 MG tablet Commonly known as: FIORICET Take 1 tablet by mouth every 8 (eight) hours as needed for headache or migraine.   cefdinir 300 MG capsule Commonly known as: OMNICEF Take 1 capsule (300 mg total) by mouth every 12 (twelve) hours for 3 days. What changed:   when to take this  additional instructions   cholecalciferol 1000 units tablet Commonly known as: VITAMIN D Take 1,000 Units by mouth daily.   clopidogrel 75 MG tablet Commonly known as: PLAVIX Take 1 tablet (75 mg total) by mouth daily.   cyanocobalamin 1000 MCG tablet Take 1 tablet (1,000 mcg total) by mouth daily.   famotidine 20 MG tablet Commonly known as: PEPCID Take 20 mg by mouth 2 (two) times daily.   feeding supplement Liqd  Take 237 mLs by mouth 2 (two) times daily between meals.   ferrous sulfate 325 (65 FE) MG tablet Take 1 tablet (325 mg total) by mouth 2 (two) times daily with a meal.   flecainide 50 MG tablet Commonly known as: TAMBOCOR Take 1 tablet (50 mg total) by mouth 2 (two) times daily. What changed: how much to take   FLUoxetine 20 MG capsule Commonly known as: PROZAC Take 20 mg by mouth 2 (two) times daily.   Foltx 1.13-25-2 MG Tabs Take 1 tablet by mouth daily.   guaiFENesin 600 MG 12 hr tablet Commonly known as: MUCINEX Take 2 tablets (1,200 mg total) by mouth 2 (two) times daily for 3 days.   levalbuterol 45 MCG/ACT inhaler Commonly known as: XOPENEX HFA Inhale 2 puffs into the lungs every 4 (four) hours as needed for wheezing. Use 2 times  daily x4 days, then every 4 hours as needed.   losartan 25 MG tablet Commonly known as: COZAAR Take 25 mg by mouth daily.   metoprolol succinate 25 MG 24 hr tablet Commonly known as: TOPROL-XL TAKE 1 TABLET BY MOUTH EVERY DAY AT BEDTIME   mirtazapine 7.5 MG tablet Commonly known as: REMERON Take 7.5 mg by mouth at bedtime.   multivitamin with minerals Tabs tablet Take 1 tablet by mouth daily.   omeprazole 40 MG capsule Commonly known as: PRILOSEC Take 40 mg by mouth daily.   Potassium 99 MG Tabs Take 99 mg by mouth 2 (two) times daily.   QUEtiapine 25 MG tablet Commonly known as: SEROQUEL Take 1 tablet (25 mg total) by mouth 2 (two) times daily.            Durable Medical Equipment  (From admission, onward)         Start     Ordered   11/20/20 2028  For home use only DME Walker rolling  Once       Question Answer Comment  Walker: With 5 Inch Wheels   Patient needs a walker to treat with the following condition Debility      11/20/20 2027         Allergies  Allergen Reactions  . Dobutamine Other (See Comments)    Heart beating hard with CP, neck pain, and weakness  . Epinephrine Other (See Comments)    Fast heart beat  . Aspirin Nausea And Vomiting    History of stomach ulcer  . Clarithromycin Other (See Comments)    Yeast infection  . Doxycycline Other (See Comments)    Makes stomach hurt  . Excedrin Extra Strength [Aspirin-Acetaminophen-Caffeine] Nausea And Vomiting  . Penicillins Other (See Comments)    Yeast infection  . Prednisone Other (See Comments)    Shakes   . Propoxyphene Nausea And Vomiting    Follow-up Information    [facility] Follow up.   Specialty: Home Health Services Contact information: [address] [place] [address] [phone]        Barbara Hill., PA-C. Schedule an appointment as soon as possible for a visit in 2 week(s).   Specialty: Family Medicine Contact information: [address] [place] [address] [phone]        Pricilla Riffle, MD .   Specialty: Cardiology Contact information: [address] [place] [address] [phone]                The results of significant diagnostics from this hospitalization (including imaging,  microbiology, ancillary and laboratory) are listed below for reference.    Significant Diagnostic Studies: DG Chest Portable 1 View  Result Date: 11/18/2020 CLINICAL DATA:  Shortness of breath. EXAM: PORTABLE CHEST 1 VIEW COMPARISON:  September 18, 2020. FINDINGS: The heart size and mediastinal contours are within normal limits. No pneumothorax is noted. Mild bibasilar atelectasis or infiltrates are noted. Small left pleural effusion is noted. The visualized skeletal structures are unremarkable. IMPRESSION: Mild bibasilar atelectasis or infiltrates are noted with small left pleural effusion. Followup PA and lateral chest X-ray is recommended in 3-4 weeks following trial of antibiotic therapy to ensure resolution and exclude underlying malignancy. Electronically Signed   By: Lupita Raider M.D.   On: 11/18/2020 14:35   ECHOCARDIOGRAM COMPLETE  Result Date: 11/19/2020    ECHOCARDIOGRAM REPORT   Patient Name:   Barbara Hill Date of Exam: 11/19/2020 Medical Rec #:  [id number]      Height:       63.0 in Accession #:    [id number]     Weight:       101.6 lb Date of Birth:  Dec 07, 1940       BSA:          1.450 m Patient Age:    79 years       BP:           136/82 mmHg Patient Gender: F              HR:           111 bpm. Exam Location:  Inpatient Procedure: 2D Echo, 3D Echo, Cardiac Doppler and Color Doppler Indications:    I48.91* Unspeicified atrial fibrillation  History:        Patient has prior history of Echocardiogram examinations, most                 recent 01/29/2020. Abnormal ECG and AV block, COPD and Stroke;                 Risk Factors:Current Smoker and Hypertension.  Sonographer:    Sheralyn Boatman RDCS  Referring Phys: [id number] STEPH[address] K CHIU IMPRESSIONS  1. Left ventricular ejection fraction, by estimation, is 65 to 70%. The left ventricle has normal function. The left ventricle has no regional wall motion abnormalities. Left ventricular diastolic parameters are indeterminate.  2. Right ventricular systolic function is normal. The right ventricular size is normal. There is moderately elevated pulmonary artery systolic pressure.  3. A small pericardial effusion is present. The pericardial effusion is circumferential. There is no evidence of cardiac tamponade.  4. The mitral valve is normal in structure. Trivial mitral valve regurgitation. No evidence of mitral stenosis.  5. The aortic valve is normal in structure. Aortic valve regurgitation is trivial. No aortic stenosis is present.  6. The inferior vena cava is dilated in size with >50% respiratory variability, suggesting right atrial pressure of 8 mmHg. FINDINGS  Left Ventricle: Left ventricular ejection fraction, by estimation, is 65 to 70%. The left ventricle has normal function. The left ventricle has no regional wall motion abnormalities. The left ventricular internal cavity size was normal in size. There is  no left ventricular hypertrophy. Left ventricular diastolic parameters are indeterminate. Right Ventricle: The right ventricular size is normal. No increase in right ventricular wall thickness. Right ventricular systolic function is normal. There is moderately elevated pulmonary artery systolic pressure. The tricuspid regurgitant velocity is 2.94 m/s, and with an assumed right atrial pressure of 15 mmHg,  the estimated right ventricular systolic pressure is 49.6 mmHg. Left Atrium: Left atrial size was normal in size. Right Atrium: Right atrial size was normal in size. Pericardium: A small pericardial effusion is present. The pericardial effusion is circumferential. There is no evidence of cardiac tamponade. Mitral Valve: The mitral valve is normal in  structure. Trivial mitral valve regurgitation. No evidence of mitral valve stenosis. Tricuspid Valve: The tricuspid valve is normal in structure. Tricuspid valve regurgitation is mild . No evidence of tricuspid stenosis. Aortic Valve: The aortic valve is normal in structure. Aortic valve regurgitation is trivial. No aortic stenosis is present. Pulmonic Valve: The pulmonic valve was normal in structure. Pulmonic valve regurgitation is not visualized. No evidence of pulmonic stenosis. Aorta: The aortic root is normal in size and structure. Venous: The inferior vena cava is dilated in size with greater than 50% respiratory variability, suggesting right atrial pressure of 8 mmHg. IAS/Shunts: No atrial level shunt detected by color flow Doppler.  LEFT VENTRICLE PLAX 2D LVIDd:         3.00 cm LVIDs:         1.70 cm LV PW:         0.99 cm LV IVS:        1.20 cm LVOT diam:     1.90 cm LV SV:         42 LV SV Index:   29 LVOT Area:     2.84 cm  LV Volumes (MOD) LV vol d, MOD A2C: 31.5 ml LV vol d, MOD A4C: 27.1 ml LV vol s, MOD A2C: 11.1 ml LV vol s, MOD A4C: 9.3 ml LV SV MOD A2C:     20.4 ml LV SV MOD A4C:     27.1 ml LV SV MOD BP:      20.6 ml RIGHT VENTRICLE            IVC RV S prime:     9.79 cm/s  IVC diam: 2.20 cm TAPSE (M-mode): 3.4 cm LEFT ATRIUM             Index       RIGHT ATRIUM           Index LA diam:        4.40 cm 3.03 cm/m  RA Area:     10.20 cm LA Vol (A2C):   30.5 ml 21.03 ml/m RA Volume:   15.60 ml  10.76 ml/m LA Vol (A4C):   23.2 ml 16.00 ml/m LA Biplane Vol: 25.8 ml 17.79 ml/m  AORTIC VALVE LVOT Vmax:   87.20 cm/s LVOT Vmean:  58.500 cm/s LVOT VTI:    0.149 m  AORTA Ao Root diam: 3.10 cm MITRAL VALVE               TRICUSPID VALVE MV Area (PHT): 4.07 cm    TR Peak grad:   34.6 mmHg MV Decel Time: 187 msec    TR Vmax:        294.00 cm/s MV E velocity: 92.15 cm/s                            SHUNTS                            Systemic VTI:  0.15 m  Systemic Diam: 1.90 cm  Chilton Si MD Electronically signed by Chilton Si MD Signature Date/Time: 11/19/2020/4:30:21 PM    Final     Microbiology: Recent Results (from the past 240 hour(s))  Resp Panel by RT-PCR (Flu A&B, Covid) Nasopharyngeal Swab     Status: None   Collection Time: 11/18/20  2:29 PM   Specimen: Nasopharyngeal Swab; Nasopharyngeal(NP) swabs in vial transport medium  Result Value Ref Range Status   SARS Coronavirus 2 by RT PCR NEGATIVE NEGATIVE Final    Comment: (NOTE) SARS-CoV-2 target nucleic acids are NOT DETECTED.  The SARS-CoV-2 RNA is generally detectable in upper respiratory specimens during the acute phase of infection. The lowest concentration of SARS-CoV-2 viral copies this assay can detect is 138 copies/mL. A negative result does not preclude SARS-Cov-2 infection and should not be used as the sole basis for treatment or other patient management decisions. A negative result may occur with  improper specimen collection/handling, submission of specimen other than nasopharyngeal swab, presence of viral mutation(s) within the areas targeted by this assay, and inadequate number of viral copies(<138 copies/mL). A negative result must be combined with clinical observations, patient history, and epidemiological information. The expected result is Negative.  Fact Sheet for Patients:  BloggerCourse.com  Fact Sheet for Healthcare Providers:  SeriousBroker.it  This test is no t yet approved or cleared by the [place] FDA and  has been authorized for detection and/or diagnosis of SARS-CoV-2 by FDA under an Emergency Use Authorization (EUA). This EUA will remain  in effect (meaning this test can be used) for the duration of the COVID-19 declaration under Section 564(b)(1) of the Act, 21 U.S.C.section 360bbb-3(b)(1), unless the authorization is terminated  or revoked sooner.       Influenza A by PCR NEGATIVE NEGATIVE Final    Influenza B by PCR NEGATIVE NEGATIVE Final    Comment: (NOTE) The Xpert Xpress SARS-CoV-2/FLU/RSV plus assay is intended as an aid in the diagnosis of influenza from Nasopharyngeal swab specimens and should not be used as a sole basis for treatment. Nasal washings and aspirates are unacceptable for Xpert Xpress SARS-CoV-2/FLU/RSV testing.  Fact Sheet for Patients: BloggerCourse.com  Fact Sheet for Healthcare Providers: SeriousBroker.it  This test is not yet approved or cleared by the [place] FDA and has been authorized for detection and/or diagnosis of SARS-CoV-2 by FDA under an Emergency Use Authorization (EUA). This EUA will remain in effect (meaning this test can be used) for the duration of the COVID-19 declaration under Section 564(b)(1) of the Act, 21 U.S.C. section 360bbb-3(b)(1), unless the authorization is terminated or revoked.  Performed at [facility] [address] [place], [place] [address]   Blood culture (routine x 2)     Status: None (Preliminary result)   Collection Time: 11/18/20  3:15 PM   Specimen: Right Antecubital; Blood  Result Value Ref Range Status   Specimen Description   Final    RIGHT ANTECUBITAL Performed at [facility], [address] [place], [place] [address]    Special Requests   Final    BOTTLES DRAWN AEROBIC AND ANAEROBIC Blood Culture adequate volume Performed at [facility], [address] [place], [place] [address]    Culture   Final    NO GROWTH 3 DAYS Performed at [facility], [address] [place], [place] [address]    Report Status PENDING  Incomplete  Blood culture (routine x 2)  Status: None (Preliminary result)   Collection Time: 11/18/20  3:20 PM   Specimen: Left Antecubital; Blood  Result Value Ref Range Status   Specimen Description   Final    LEFT ANTECUBITAL Performed at [facility], [address] [place], [place] [address]    Special Requests   Final    BOTTLES DRAWN AEROBIC AND ANAEROBIC Blood Culture results may not be optimal due to an excessive volume of blood received in culture bottles Performed at [facility], [address] [place], [place] [address]    Culture   Final    NO GROWTH 3 DAYS Performed at [facility], [address] [place], [place] [address]    Report Status PENDING  Incomplete     Labs: Basic Metabolic Panel: Recent Labs  Lab 11/18/20 1429 11/18/20 2150 11/19/20 0544 11/20/20 0629 11/21/20 0552 11/22/20 0509  NA 136  --  138 139 137 136  K 3.2*  --  2.8* 4.7 4.1 3.9  CL 97*  --  105 110 104 102  CO2 27  --  24 23 22 26   GLUCOSE 102*  --  78 104* 94 97  BUN 16  --  14 11 8 9   CREATININE 1.06*  --  0.90 0.89 0.88 0.86  CALCIUM 9.1  --  8.1* 9.0 9.1 9.4  MG  --  1.2*  --  2.0 1.4* 1.9   Liver Function Tests: Recent Labs  Lab 11/18/20 1429 11/20/20 0629  AST 20 16  ALT 15 11  ALKPHOS 100 70  BILITOT 0.3 0.3  PROT 8.1 6.3*  ALBUMIN 2.7* 2.2*   No results for input(s): LIPASE, AMYLASE in the last 168 hours. No results for input(s): AMMONIA in the last 168 hours. CBC: Recent Labs  Lab 11/18/20 1429 11/19/20 0544 11/20/20 0629 11/21/20 0552 11/22/20 0509  WBC 13.9* 10.7* 8.8 8.0 9.2  NEUTROABS 11.1*  --   --   --  6.4  HGB 12.5 10.1* 9.6* 10.4* 11.0*  HCT 37.1 30.8* 29.7* 32.9* 33.6*  MCV 90.5 92.2 91.7 94.3 91.6  PLT 487* 408* 425* 441* 492*   Cardiac Enzymes: No results for input(s): CKTOTAL, CKMB, CKMBINDEX, TROPONINI in the last 168 hours. BNP: BNP (last 3 results) Recent Labs    09/18/20 1527  BNP 974.0*    ProBNP (last 3 results) No results for input(s): PROBNP in the last 8760 hours.  CBG: No results for input(s): GLUCAP in the last 168 hours.     Signed:  01/22/21 MD.  [facility] 11/22/2020, 1:03 PM

## 2020-11-22 NOTE — Evaluation (Signed)
Occupational Therapy Evaluation Patient Details Name: Barbara Hill MRN: [id number] DOB: 06/29/1941 Today's Date: 11/22/2020    History of Present Illness Pt is 80 y.o. female admitted on 2/38/22 with PNE.  She has medical history significant of dementia, anxiety, depression, brain aneurysm, CVA, CKD 3, COPD, GERD, paroxysmal SVT, normocytic anemia, orthostatic hypotension, mitral valve prolapse.   Clinical Impression   Barbara Hill is a 80 year old woman who presents supine in bed on room air, pleasant and agreeable to therapy. Patient exhibits some mild memory impairments as is expected from her dementia diagnosis but able to follow commands, answer questions and maintain appropriate conversation. Patient min guard for transfers and ambulation with RW. Patient's gait slow with walker and posture poor. Patient min guard for all ADLs and needing seated positioning for most tasks. Patient's HR and O2 sat WFL during evaluation and patient had no complaints of fatigue or shortness of breath. Patient reports independence with BADLs at home and has assistance of daughter. Patient appears to be nearing her baseline. Will follow acutely in order to advance mobility and ADLs to supervision in order for patient to return home safely.    Follow Up Recommendations  No OT follow up    Equipment Recommendations  None recommended by OT    Recommendations for Other Services       Precautions / Restrictions Precautions Precautions: Fall Precaution Comments: orthostatic BP Restrictions Weight Bearing Restrictions: No      Mobility Bed Mobility Overal bed mobility: Needs Assistance Bed Mobility: Supine to Sit;Sit to Supine     Supine to sit: HOB elevated;Min guard     General bed mobility comments: increased time; use of rails    Transfers Overall transfer level: Needs assistance Equipment used: Rolling walker (2 wheeled);1 person hand held assist Transfers: Sit to/from Stand            General transfer comment: min guard with RW to stand and ambulate to bathroom. Patient's gait is slow, exhibits kyphotic posture and forward head and looking at the ground. Increased time for gait and toilet transfer but no physical assistance.    Balance Overall balance assessment: Needs assistance Sitting-balance support: No upper extremity supported Sitting balance-Leahy Scale: Good     Standing balance support: During functional activity;Bilateral upper extremity supported Standing balance-Leahy Scale: Poor Standing balance comment: reliant on walker                           ADL either performed or assessed with clinical judgement   ADL Overall ADL's : Needs assistance/impaired Eating/Feeding: Independent   Grooming: Min guard;Standing   Upper Body Bathing: Set up;Sitting   Lower Body Bathing: Sit to/from stand;Set up;Min guard   Upper Body Dressing : Set up;Sitting   Lower Body Dressing: Min guard;Sit to/from stand Lower Body Dressing Details (indicate cue type and reason): able to don socks in sitting, min guard for standing ADL Toilet Transfer: Development worker, community and Hygiene: Min guard;Sit to/from stand       Functional mobility during ADLs: Min guard;Rolling walker       Vision Patient Visual Report: No change from baseline Vision Assessment?: No apparent visual deficits     Perception     Praxis      Pertinent Vitals/Pain Pain Assessment: No/denies pain     Hand Dominance Right   Extremity/Trunk Assessment Upper Extremity Assessment Upper Extremity Assessment: Overall WFL for tasks  assessed   Lower Extremity Assessment Lower Extremity Assessment: Overall WFL for tasks assessed   Cervical / Trunk Assessment Cervical / Trunk Assessment: Kyphotic   Communication Communication Communication: No difficulties   Cognition Arousal/Alertness: Awake/alert Behavior During Therapy: WFL for  tasks assessed/performed Overall Cognitive Status: History of cognitive impairments - at baseline                                 General Comments: short term memory deficits, h/o dementia. is alert to self, location and Economist. Does not know year. Appropriate responses to PLOF questions.   General Comments       Exercises     Shoulder Instructions      Home Living Family/patient expects to be discharged to:: Private residence Living Arrangements: Children Available Help at Discharge: Family;Available 24 hours/day Type of Home: House Home Access: Stairs to enter [employer] of Steps: 2 Entrance Stairs-Rails: None Home Layout: One level     Bathroom Shower/Tub: Producer, television/film/video: Standard     Home Equipment: Grab bars - toilet;Grab bars - tub/shower;Walker - 4 wheels;Bedside commode;Shower seat   Additional Comments: lives with daughter, grandson and his girlfriend      Prior Functioning/Environment Level of Independence: Independent        Comments: walks without AD, sometimes gets "overbalanced" but denies falls in past 1 year, daughter assists with shower transfers as needed. reports independence with ADLs - some assist for shower if she needs it        OT Problem List: Decreased activity tolerance;Impaired balance (sitting and/or standing);Decreased safety awareness;Decreased cognition      OT Treatment/Interventions: Self-care/ADL training;DME and/or AE instruction;Therapeutic activities;Balance training;Patient/family education    OT Goals(Current goals can be found in the care plan section) Acute Rehab OT Goals Patient Stated Goal: DC home OT Goal Formulation: With patient Time For Goal Achievement: 12/06/20  OT Frequency: Min 2X/week   Barriers to D/C:            Co-evaluation              AM-PAC OT "6 Clicks" Daily Activity     Outcome Measure Help from another person eating meals?: None Help from  another person taking care of personal grooming?: A Little Help from another person toileting, which includes using toliet, bedpan, or urinal?: A Little Help from another person bathing (including washing, rinsing, drying)?: A Little Help from another person to put on and taking off regular upper body clothing?: A Little Help from another person to put on and taking off regular lower body clothing?: A Little 6 Click Score: 19   End of Session Equipment Utilized During Treatment: Engineer, water Communication:  (chair alarm dead - needs battery)  Activity Tolerance: Patient tolerated treatment well Patient left: in chair;with call bell/phone within reach  OT Visit Diagnosis: Unsteadiness on feet (R26.81);Other abnormalities of gait and mobility (R26.89)                Time: 2671-2458 OT Time Calculation (min): 23 min Charges:  OT General Charges $OT Visit: 1 Visit OT Evaluation $OT Eval Low Complexity: 1 Low OT Treatments $Self Care/Home Management : 8-22 mins  Osualdo Hansell, OTR/L Acute Care Rehab Services  Office [phone] Pager: [phone]   Kelli Churn 11/22/2020, 11:34 AM

## 2020-11-23 LAB — CULTURE, BLOOD (ROUTINE X 2)
Culture: NO GROWTH
Culture: NO GROWTH
Special Requests: ADEQUATE

## 2020-11-27 ENCOUNTER — Inpatient Hospital Stay (HOSPITAL_BASED_OUTPATIENT_CLINIC_OR_DEPARTMENT_OTHER)
Admission: EM | Admit: 2020-11-27 | Discharge: 2020-12-04 | DRG: 480 | Disposition: A | Discharge status: Hospice | Payer: Medicare Other | Attending: Family Medicine | Admitting: Family Medicine

## 2020-11-27 ENCOUNTER — Encounter (HOSPITAL_BASED_OUTPATIENT_CLINIC_OR_DEPARTMENT_OTHER): Payer: Self-pay

## 2020-11-27 ENCOUNTER — Emergency Department (HOSPITAL_BASED_OUTPATIENT_CLINIC_OR_DEPARTMENT_OTHER): Payer: Medicare Other

## 2020-11-27 ENCOUNTER — Other Ambulatory Visit: Payer: Self-pay

## 2020-11-27 DIAGNOSIS — Z888 Allergy status to other drugs, medicaments and biological substances status: Secondary | ICD-10-CM

## 2020-11-27 DIAGNOSIS — W1830XA Fall on same level, unspecified, initial encounter: Secondary | ICD-10-CM | POA: Diagnosis present

## 2020-11-27 DIAGNOSIS — Z881 Allergy status to other antibiotic agents status: Secondary | ICD-10-CM

## 2020-11-27 DIAGNOSIS — I639 Cerebral infarction, unspecified: Secondary | ICD-10-CM | POA: Diagnosis not present

## 2020-11-27 DIAGNOSIS — Z7189 Other specified counseling: Secondary | ICD-10-CM | POA: Diagnosis not present

## 2020-11-27 DIAGNOSIS — F0150 Vascular dementia without behavioral disturbance: Secondary | ICD-10-CM | POA: Diagnosis present

## 2020-11-27 DIAGNOSIS — F17210 Nicotine dependence, cigarettes, uncomplicated: Secondary | ICD-10-CM | POA: Diagnosis present

## 2020-11-27 DIAGNOSIS — I63311 Cerebral infarction due to thrombosis of right middle cerebral artery: Secondary | ICD-10-CM | POA: Diagnosis not present

## 2020-11-27 DIAGNOSIS — Z8249 Family history of ischemic heart disease and other diseases of the circulatory system: Secondary | ICD-10-CM

## 2020-11-27 DIAGNOSIS — R0602 Shortness of breath: Secondary | ICD-10-CM

## 2020-11-27 DIAGNOSIS — I10 Essential (primary) hypertension: Secondary | ICD-10-CM | POA: Diagnosis present

## 2020-11-27 DIAGNOSIS — Z8673 Personal history of transient ischemic attack (TIA), and cerebral infarction without residual deficits: Secondary | ICD-10-CM | POA: Diagnosis not present

## 2020-11-27 DIAGNOSIS — Z978 Presence of other specified devices: Secondary | ICD-10-CM

## 2020-11-27 DIAGNOSIS — F419 Anxiety disorder, unspecified: Secondary | ICD-10-CM | POA: Diagnosis present

## 2020-11-27 DIAGNOSIS — R9431 Abnormal electrocardiogram [ECG] [EKG]: Secondary | ICD-10-CM

## 2020-11-27 DIAGNOSIS — S72009A Fracture of unspecified part of neck of unspecified femur, initial encounter for closed fracture: Secondary | ICD-10-CM | POA: Diagnosis present

## 2020-11-27 DIAGNOSIS — M1612 Unilateral primary osteoarthritis, left hip: Secondary | ICD-10-CM | POA: Diagnosis present

## 2020-11-27 DIAGNOSIS — S72002D Fracture of unspecified part of neck of left femur, subsequent encounter for closed fracture with routine healing: Secondary | ICD-10-CM | POA: Diagnosis not present

## 2020-11-27 DIAGNOSIS — I4891 Unspecified atrial fibrillation: Secondary | ICD-10-CM | POA: Diagnosis present

## 2020-11-27 DIAGNOSIS — E785 Hyperlipidemia, unspecified: Secondary | ICD-10-CM | POA: Diagnosis present

## 2020-11-27 DIAGNOSIS — N1831 Chronic kidney disease, stage 3a: Secondary | ICD-10-CM | POA: Diagnosis present

## 2020-11-27 DIAGNOSIS — I739 Peripheral vascular disease, unspecified: Secondary | ICD-10-CM | POA: Diagnosis present

## 2020-11-27 DIAGNOSIS — Z20822 Contact with and (suspected) exposure to covid-19: Secondary | ICD-10-CM | POA: Diagnosis not present

## 2020-11-27 DIAGNOSIS — I129 Hypertensive chronic kidney disease with stage 1 through stage 4 chronic kidney disease, or unspecified chronic kidney disease: Secondary | ICD-10-CM | POA: Diagnosis present

## 2020-11-27 DIAGNOSIS — Z7902 Long term (current) use of antithrombotics/antiplatelets: Secondary | ICD-10-CM

## 2020-11-27 DIAGNOSIS — Z79899 Other long term (current) drug therapy: Secondary | ICD-10-CM

## 2020-11-27 DIAGNOSIS — G9341 Metabolic encephalopathy: Secondary | ICD-10-CM | POA: Diagnosis not present

## 2020-11-27 DIAGNOSIS — Z419 Encounter for procedure for purposes other than remedying health state, unspecified: Secondary | ICD-10-CM

## 2020-11-27 DIAGNOSIS — Y92017 Garden or yard in single-family (private) house as the place of occurrence of the external cause: Secondary | ICD-10-CM

## 2020-11-27 DIAGNOSIS — Z7951 Long term (current) use of inhaled steroids: Secondary | ICD-10-CM

## 2020-11-27 DIAGNOSIS — R1310 Dysphagia, unspecified: Secondary | ICD-10-CM | POA: Diagnosis not present

## 2020-11-27 DIAGNOSIS — I341 Nonrheumatic mitral (valve) prolapse: Secondary | ICD-10-CM | POA: Diagnosis present

## 2020-11-27 DIAGNOSIS — R29717 NIHSS score 17: Secondary | ICD-10-CM | POA: Diagnosis not present

## 2020-11-27 DIAGNOSIS — Z886 Allergy status to analgesic agent status: Secondary | ICD-10-CM

## 2020-11-27 DIAGNOSIS — D631 Anemia in chronic kidney disease: Secondary | ICD-10-CM | POA: Diagnosis present

## 2020-11-27 DIAGNOSIS — I471 Supraventricular tachycardia, unspecified: Secondary | ICD-10-CM | POA: Diagnosis present

## 2020-11-27 DIAGNOSIS — M80052A Age-related osteoporosis with current pathological fracture, left femur, initial encounter for fracture: Principal | ICD-10-CM | POA: Diagnosis present

## 2020-11-27 DIAGNOSIS — Z880 Allergy status to penicillin: Secondary | ICD-10-CM

## 2020-11-27 DIAGNOSIS — J690 Pneumonitis due to inhalation of food and vomit: Secondary | ICD-10-CM | POA: Diagnosis not present

## 2020-11-27 DIAGNOSIS — R627 Adult failure to thrive: Secondary | ICD-10-CM | POA: Diagnosis not present

## 2020-11-27 DIAGNOSIS — J449 Chronic obstructive pulmonary disease, unspecified: Secondary | ICD-10-CM | POA: Diagnosis present

## 2020-11-27 DIAGNOSIS — R059 Cough, unspecified: Secondary | ICD-10-CM

## 2020-11-27 DIAGNOSIS — S72012A Unspecified intracapsular fracture of left femur, initial encounter for closed fracture: Secondary | ICD-10-CM | POA: Diagnosis present

## 2020-11-27 DIAGNOSIS — Z66 Do not resuscitate: Secondary | ICD-10-CM | POA: Diagnosis present

## 2020-11-27 DIAGNOSIS — Z8781 Personal history of (healed) traumatic fracture: Secondary | ICD-10-CM

## 2020-11-27 DIAGNOSIS — S72002A Fracture of unspecified part of neck of left femur, initial encounter for closed fracture: Secondary | ICD-10-CM

## 2020-11-27 DIAGNOSIS — R1319 Other dysphagia: Secondary | ICD-10-CM | POA: Diagnosis not present

## 2020-11-27 DIAGNOSIS — Z515 Encounter for palliative care: Secondary | ICD-10-CM | POA: Diagnosis not present

## 2020-11-27 DIAGNOSIS — N183 Chronic kidney disease, stage 3 unspecified: Secondary | ICD-10-CM | POA: Diagnosis present

## 2020-11-27 DIAGNOSIS — Z9889 Other specified postprocedural states: Secondary | ICD-10-CM

## 2020-11-27 LAB — RESP PANEL BY RT-PCR (FLU A&B, COVID) ARPGX2
Influenza A by PCR: NEGATIVE
Influenza B by PCR: NEGATIVE
SARS Coronavirus 2 by RT PCR: NEGATIVE

## 2020-11-27 LAB — BASIC METABOLIC PANEL
Anion gap: 14 (ref 5–15)
BUN: 18 mg/dL (ref 8–23)
CO2: 23 mmol/L (ref 22–32)
Calcium: 9.7 mg/dL (ref 8.9–10.3)
Chloride: 98 mmol/L (ref 98–111)
Creatinine, Ser: 0.98 mg/dL (ref 0.44–1.00)
GFR, Estimated: 59 mL/min — ABNORMAL LOW (ref >60)
Glucose, Bld: 132 mg/dL — ABNORMAL HIGH (ref 70–99)
Potassium: 3.5 mmol/L (ref 3.5–5.1)
Sodium: 135 mmol/L (ref 135–145)

## 2020-11-27 LAB — CBC
HCT: 37.3 % (ref 36.0–46.0)
Hemoglobin: 12.4 g/dL (ref 12.0–15.0)
MCH: 29.8 pg (ref 26.0–34.0)
MCHC: 33.2 g/dL (ref 30.0–36.0)
MCV: 89.7 fL (ref 80.0–100.0)
Platelets: 617 10*3/uL — ABNORMAL HIGH (ref 150–400)
RBC: 4.16 MIL/uL (ref 3.87–5.11)
RDW: 13.5 % (ref 11.5–15.5)
WBC: 17 10*3/uL — ABNORMAL HIGH (ref 4.0–10.5)
nRBC: 0 % (ref 0.0–0.2)

## 2020-11-27 MED ORDER — CHLORHEXIDINE GLUCONATE 4 % EX LIQD
60.0000 mL | CUTANEOUS | Status: DC
  Filled 2020-11-27: qty 60

## 2020-11-27 MED ORDER — AMLODIPINE BESYLATE 5 MG PO TABS
5.0000 mg | ORAL_TABLET | Freq: Once | ORAL | Status: AC
  Administered 2020-11-27: 5 mg via ORAL
  Filled 2020-11-27: qty 1

## 2020-11-27 MED ORDER — CEFAZOLIN SODIUM-DEXTROSE 2-4 GM/100ML-% IV SOLN
2.0000 g | INTRAVENOUS | Status: AC
  Administered 2020-11-28: 2 g via INTRAVENOUS
  Filled 2020-11-27: qty 100

## 2020-11-27 MED ORDER — FENTANYL CITRATE (PF) 100 MCG/2ML IJ SOLN
50.0000 ug | Freq: Once | INTRAMUSCULAR | Status: AC
  Administered 2020-11-27: 50 ug via INTRAVENOUS
  Filled 2020-11-27: qty 2

## 2020-11-27 MED ORDER — TRANEXAMIC ACID-NACL 1000-0.7 MG/100ML-% IV SOLN
1000.0000 mg | INTRAVENOUS | Status: DC
  Filled 2020-11-27: qty 100

## 2020-11-27 MED ORDER — POVIDONE-IODINE 10 % EX SWAB: 2.0000 "application " | Freq: Once | CUTANEOUS | Status: DC

## 2020-11-27 MED ORDER — ENSURE PRE-SURGERY PO LIQD
296.0000 mL | Freq: Once | ORAL | Status: AC
  Administered 2020-11-27: 296 mL via ORAL
  Filled 2020-11-27: qty 296

## 2020-11-27 NOTE — ED Notes (Signed)
Will obtain VS when patient returns from XR and CT

## 2020-11-27 NOTE — ED Notes (Signed)
Patient transported to X-ray 

## 2020-11-27 NOTE — Progress Notes (Signed)
Left hip fx, on plavix at [facility].   Plan for transfer to [facility] and surgery tomorrow with pinning.  Full consult to follow, possibly in AM depending on transfer.   Keep NPO after midnight.   Eulas Post, MD

## 2020-11-27 NOTE — ED Notes (Signed)
ED Provider at bedside. 

## 2020-11-27 NOTE — H&P (View-Only) (Signed)
ORTHOPAEDIC CONSULTATION  REQUESTING PHYSICIAN: Zigmund Daniel., *  Chief Complaint: Fall  HPI: Barbara Hill is a 80 y.o. female with history of dementia, anxiety, depression, CVA, CKD 3, COPD, GERD, paroxysmal SVT, orthostatic hypotension, mitral valve prolapse, anemia who presented to the emergency department after a fall on April 2nd. Daughter states that patient was confused and walked outside on her own, which is not normal for her, and fell.  Since then she has been complaining to family that her left knee hurt.  She has been using ice but continued to have trouble with pain and weightbearing.  CT scan of left hip was performed showing a left femoral neck fracture.  She was transferred from [facility] to [facility] for definitive surgical management. Patient was recently just discharged on 11/22/2020 from [facility] due to pneumonia.  On exam today patient is pleasantly confused and does not complain of any pain at her left knee knee or left hip.  Daughter states that she walks with no assistive device at baseline. She is on Plavix for stroke prophylaxis. Patient lives with her daughter and daughter's family.   History provided by patient and daughter. Past Medical History:  Diagnosis Date  . Anxiety   . Brain aneurysm    pipeline stent 2017   . COPD (chronic obstructive pulmonary disease) (HCC)   . Fatigue   . H/O: hysterectomy   . Headache(784.0)   . History of cardiac catheterization    LHC 10/02: Normal coronary arteries  . History of nuclear stress test    Myoview 10/12: Normal stress nuclear study.  . Hypertension   . Mitral valve prolapse    Echo 8/17:Mild LVH, EF 60-65, normal wall motion, grade 2 diastolic dysfunction, mild AI, mild MR, mild LAE // echo 11/01: EF 60%, mild anterior MVP with trivial MR, PASP 35-40 mmHg, LAE  . Orthostasis   . PSVT (paroxysmal supraventricular tachycardia) (HCC)    palps  . Radiculopathy    Past  Surgical History:  Procedure Laterality Date  . ABDOMINAL HYSTERECTOMY    . APPENDECTOMY    . BUBBLE STUDY  01/29/2020   Procedure: BUBBLE STUDY;  Surgeon: Parke Poisson, MD;  Location: [facility] ENDOSCOPY;  Service: Cardiology;;  . CARDIAC CATHETERIZATION  12/95  . CATARACT EXTRACTION    . CHOLECYSTECTOMY    . EMBOLECTOMY Bilateral 01/25/2020   Procedure: Bilateral popliteal EMBOLECTOMY.;  Surgeon: [name], MD;  Location: [facility] OR;  Service: Vascular;  Laterality: Bilateral;  . FASCIOTOMY Bilateral 01/26/2020   Procedure: FASCIOTOMY;  Surgeon: [name], MD;  Location: [facility] OR;  Service: Vascular;  Laterality: Bilateral;  . IR GENERIC HISTORICAL  06/02/2016   IR RADIOLOGIST EVAL & MGMT 06/02/2016 [facility] RAD  . IR GENERIC HISTORICAL  06/29/2016   IR ANGIO INTRA EXTRACRAN SEL INTERNAL CAROTID UNI R MOD SED 06/29/2016 [name], MD [facility]  . IR GENERIC HISTORICAL  06/29/2016   IR ANGIOGRAM FOLLOW UP STUDY 06/29/2016 [name], MD [facility]  . IR GENERIC HISTORICAL  06/29/2016   IR NEURO EACH ADD'L AFTER BASIC UNI RIGHT (MS) 06/29/2016 [name], MD [facility]  . IR GENERIC HISTORICAL  06/29/2016   IR 3D INDEPENDENT WKST 06/29/2016 [name], MD [facility]  . IR GENERIC HISTORICAL  06/29/2016   IR TRANSCATH/EMBOLIZ 06/29/2016 [name], MD [facility]  . IR GENERIC HISTORICAL  06/29/2016   IR ANGIO VERTEBRAL SEL SUBCLAVIAN INNOMINATE UNI R MOD SED  06/29/2016 [name], MD [facility]  . IR GENERIC HISTORICAL  07/28/2016   IR RADIOLOGIST EVAL & MGMT 07/28/2016 [facility]  . IR GENERIC HISTORICAL  11/12/2016   IR ANGIO INTRA EXTRACRAN SEL COM CAROTID INNOMINATE BILAT MOD SED 11/12/2016 [name], MD [facility]  . IR GENERIC HISTORICAL  11/12/2016   IR ANGIO VERTEBRAL SEL VERTEBRAL UNI L MOD SED 11/12/2016 [name], MD [facility]  . IR GENERIC HISTORICAL  11/12/2016   IR ANGIO VERTEBRAL SEL SUBCLAVIAN INNOMINATE  UNI R MOD SED 11/12/2016 [name], MD [facility]  . NASAL SEPTUM SURGERY    . RADIOLOGY WITH ANESTHESIA N/A 06/29/2016   Procedure: EMBOLIZATION;  Surgeon: [name], MD;  Location: [facility] OR;  Service: Radiology;  Laterality: N/A;  . ROTATOR CUFF REPAIR    . TEE WITHOUT CARDIOVERSION N/A 01/29/2020   Procedure: TRANSESOPHAGEAL ECHOCARDIOGRAM (TEE);  Surgeon: Parke Poisson, MD;  Location: [facility] ENDOSCOPY;  Service: Cardiology;  Laterality: N/A;   Social History   Socioeconomic History  . Marital status: Widowed    Spouse name: Not on file  . Number of children: 2  . Years of education: 25  . Highest education level: Not on file  Occupational History  . Occupation: Retired  Tobacco Use  . Smoking status: Former Smoker    Packs/day: 1.00    Years: 50.00    Pack years: 50.00    Types: Cigarettes    Quit date: 11/13/2020    Years since quitting: 0.0  . Smokeless tobacco: Never Used  . Tobacco comment: stopped with recent admn for PNA  Vaping Use  . Vaping Use: Never used  Substance and Sexual Activity  . Alcohol use: No  . Drug use: No  . Sexual activity: Not on file  Other Topics Concern  . Not on file  Social History Narrative   Lives with daughter and her family   Caffeine use: none   Social Determinants of Health   Financial Resource Strain: Not on file  Food Insecurity: Not on file  Transportation Needs: Not on file  Physical Activity: Not on file  Stress: Not on file  Social Connections: Not on file   Family History  Problem Relation Age of Onset  . Heart attack Mother   . Transient ischemic attack Mother   . Dementia Mother        "at the end"  . Heart attack Father 40  . Dementia Brother   . Heart attack Other   . Asthma Other   . Heart failure Other   . Osteoporosis Other   . Heart Problems Brother   . CAD Other    Allergies  Allergen Reactions  . Dobutamine Other (See Comments)    Heart beating hard with CP, neck pain, and weakness   . Epinephrine Other (See Comments)    Fast heart beat  . Aspirin Nausea And Vomiting    History of stomach ulcer  . Clarithromycin Other (See Comments)    Yeast infection  . Doxycycline Other (See Comments)    Makes stomach hurt  . Excedrin Extra Strength [Aspirin-Acetaminophen-Caffeine] Nausea And Vomiting  . Penicillins Other (See Comments)    Yeast infection  . Prednisone Other (See Comments)    Shakes   . Propoxyphene Nausea And Vomiting     Positive ROS: All other systems have been reviewed and were otherwise negative with the exception of those mentioned in the HPI and as above.  Physical Exam: General: Alert, elderly  female, no acute distress, sitting up in bed.  Cardiovascular: No pedal edema Respiratory: No cyanosis, no use of accessory musculature GI: No organomegaly, abdomen is soft and non-tender Skin: No lesions noted at left hip.  No erythema or ecchymosis to left hip. Neurologic: Sensation intact distally Psychiatric: pleasantly confused.  Oriented to self only. Lymphatic: No axillary or cervical lymphadenopathy  MUSCULOSKELETAL: No shortening of left lower extremity.  No tenderness to palpation to any aspects of left knee or greater trochanter of left hip.  Dorsiflexion and plantarflexion intact bilaterally.  Patient able to move all toes of bilateral feet.  Patient endorses sensation to all aspects of bilateral feet.   Imaging:  Ct Left hip: Acute mildly impacted and displaced fracture of the left femoral Neck with underlying moderate left hip osteoarthritis.    Assessment: Left femoral neck fracture -Plan for left hip percutaneous pinning by Dr. Dion Saucier tomorrow afternoon - Risks, benefits, and alternative of a left hip percutaneous pinning were discussed with patient's daughter by phone Vassie Moselle, who would like to move forward with surgery -Nonweightbearing left lower extremity -N.p.o. after midnight   Armida Sans, PA-C    11/27/2020 7:53  PM

## 2020-11-27 NOTE — Progress Notes (Signed)
   11/27/20 1841  Vitals  Temp 98.9 F (37.2 C)  Temp Source Oral  BP (!) 152/65  MAP (mmHg) 89  BP Location Left Arm  BP Method Automatic  Patient Position (if appropriate) Lying  Pulse Rate 66  Resp 16  MEWS COLOR  MEWS Score Color Green  Oxygen Therapy  SpO2 99 %  Pain Assessment  Pain Scale 0-10  Pain Score 0  Arrived to room 1424. Patient alert and oriented x 4, pleasant periods of confusion. Per previous RN patient received pain medication that contributed to worsening confusion.  Patient RR even and unlabored,on room air.Placed on telemetry. +2 pulses to upper and lower extremities. Skin swarm performed, with Solicitor. Scab noted to upper face.  mrsa screening completed and sent to the lab. Denying any pain except for with movement to left leg.  Oriented to room, given call bell, and instructed to call out for any further needs.

## 2020-11-27 NOTE — ED Provider Notes (Signed)
[facility] EMERGENCY DEPARTMENT Provider Note   CSN: [id number] Arrival date & time: 11/27/20  1237     History Chief Complaint  Patient presents with  . Fall    Barbara Hill is a 80 y.o. female.  HPI   Patient presented to the ED for evaluation of pain after a fall on April 2.  Patient has a history of dementia.  She was recently in the hospital for pneumonia.  Patient is currently on anticoagulation.  History is primarily provided by the daughter who she lives with.  She was walking outside which normally she does not do and she fell.  Since then she has been complaining of pain in her knee.  It hurts for her to walk.  Daughter has been giving her over-the-counter pain medication has been icing it.  Patient is still having trouble with pain.  No complaints of headaches although she did hit her head.  She is not had any nausea vomiting.  No fevers.  No complaints issues with her breathing.  Past Medical History:  Diagnosis Date  . Anxiety   . Brain aneurysm    pipeline stent 2017   . COPD (chronic obstructive pulmonary disease) (HCC)   . Fatigue   . H/O: hysterectomy   . Headache(784.0)   . History of cardiac catheterization    LHC 10/02: Normal coronary arteries  . History of nuclear stress test    Myoview 10/12: Normal stress nuclear study.  . Hypertension   . Mitral valve prolapse    Echo 8/17:Mild LVH, EF 60-65, normal wall motion, grade 2 diastolic dysfunction, mild AI, mild MR, mild LAE // echo 11/01: EF 60%, mild anterior MVP with trivial MR, PASP 35-40 mmHg, LAE  . Orthostasis   . PSVT (paroxysmal supraventricular tachycardia) (HCC)    palps  . Radiculopathy     Patient Active Problem List   Diagnosis Date Noted  . Hypertension   . Sepsis due to pneumonia (HCC) 11/19/2020  . CAP (community acquired pneumonia) 11/18/2020  . TIA (transient ischemic attack) 09/17/2020  . Protein-calorie malnutrition, severe 02/05/2020  . Ischemic leg 01/25/2020  .  COPD (chronic obstructive pulmonary disease) (HCC) 01/25/2020  . Normocytic anemia 01/25/2020  . History of CVA (cerebrovascular accident) 01/25/2020  . Anxiety 01/25/2020  . CKD (chronic kidney disease), stage III (HCC) 01/25/2020  . 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 Brain aneurysm 06/29/2016  . Lumbar contusion   . Orthostatic hypotension   . First degree AV block   . Hypokalemia   . Syncope 04/10/2016  . Paroxysmal SVT (supraventricular tachycardia) (HCC) 04/21/2011  . 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 Tobacco use disorder 04/21/2011    Past Surgical History:  Procedure Laterality Date  . ABDOMINAL HYSTERECTOMY    . APPENDECTOMY    . BUBBLE STUDY  01/29/2020   Procedure: BUBBLE STUDY;  Surgeon: Parke Poisson, MD;  Location: [facility] ENDOSCOPY;  Service: Cardiology;;  . CARDIAC CATHETERIZATION  12/95  . CATARACT EXTRACTION    . CHOLECYSTECTOMY    . EMBOLECTOMY Bilateral 01/25/2020   Procedure: Bilateral popliteal EMBOLECTOMY.;  Surgeon: [name], MD;  Location: [facility] OR;  Service: Vascular;  Laterality: Bilateral;  . FASCIOTOMY Bilateral 01/26/2020   Procedure: FASCIOTOMY;  Surgeon: [name], MD;  Location: [facility] OR;  Service: Vascular;  Laterality: Bilateral;  . IR GENERIC HISTORICAL  06/02/2016   IR RADIOLOGIST EVAL & MGMT 06/02/2016 [facility]  . IR GENERIC HISTORICAL  06/29/2016  IR ANGIO INTRA EXTRACRAN SEL INTERNAL CAROTID UNI R MOD SED 06/29/2016 Julieanne CottonSanjeev Deveshwar, MD [facility]  . IR GENERIC HISTORICAL  06/29/2016   IR ANGIOGRAM FOLLOW UP STUDY 06/29/2016 Julieanne CottonSanjeev Deveshwar, MD [facility]  . IR GENERIC HISTORICAL  06/29/2016   IR NEURO EACH ADD'L AFTER BASIC UNI RIGHT (MS) 06/29/2016 Julieanne CottonSanjeev Deveshwar, MD [facility]  . IR GENERIC HISTORICAL  06/29/2016   IR 3D INDEPENDENT WKST 06/29/2016 Julieanne CottonSanjeev Deveshwar, MD [facility]  . IR GENERIC HISTORICAL  06/29/2016   IR TRANSCATH/EMBOLIZ 06/29/2016 Julieanne CottonSanjeev Deveshwar, MD [facility]  . IR GENERIC  HISTORICAL  06/29/2016   IR ANGIO VERTEBRAL SEL SUBCLAVIAN INNOMINATE UNI R MOD SED 06/29/2016 Julieanne CottonSanjeev Deveshwar, MD [facility]  . IR GENERIC HISTORICAL  07/28/2016   IR RADIOLOGIST EVAL & MGMT 07/28/2016 [facility]  . IR GENERIC HISTORICAL  11/12/2016   IR ANGIO INTRA EXTRACRAN SEL COM CAROTID INNOMINATE BILAT MOD SED 11/12/2016 Julieanne CottonSanjeev Deveshwar, MD [facility]  . IR GENERIC HISTORICAL  11/12/2016   IR ANGIO VERTEBRAL SEL VERTEBRAL UNI L MOD SED 11/12/2016 Julieanne CottonSanjeev Deveshwar, MD [facility]  . IR GENERIC HISTORICAL  11/12/2016   IR ANGIO VERTEBRAL SEL SUBCLAVIAN INNOMINATE UNI R MOD SED 11/12/2016 Julieanne CottonSanjeev Deveshwar, MD [facility]  . NASAL SEPTUM SURGERY    . RADIOLOGY WITH ANESTHESIA N/A 06/29/2016   Procedure: EMBOLIZATION;  Surgeon: Julieanne CottonSanjeev Deveshwar, MD;  Location: [facility] OR;  Service: Radiology;  Laterality: N/A;  . ROTATOR CUFF REPAIR    . TEE WITHOUT CARDIOVERSION N/A 01/29/2020   Procedure: TRANSESOPHAGEAL ECHOCARDIOGRAM (TEE);  Surgeon: Parke PoissonAcharya, Gayatri A, MD;  [facility];  Service: Cardiology;  Laterality: N/A;     OB History   No obstetric history on file.     Family History  Problem Relation Age of Onset  . Heart attack Mother   . Transient ischemic attack Mother   . Dementia Mother        "at the end"  . Heart attack Father [id number]  . Dementia Brother   . Heart attack Other   . Asthma Other   . Heart failure Other   . Osteoporosis Other   . Heart Problems Brother   . CAD Other     Social History   Tobacco Use  . Smoking status: Former Smoker    Packs/day: 1.00    Years: 50.00    Pack years: 50.00    Types: Cigarettes    Quit date: 11/13/2020    Years since quitting: 0.0  . Smokeless tobacco: Never Used  . Tobacco comment: stopped with recent admn for PNA  Vaping Use  . Vaping Use: Never used  Substance Use Topics  . Alcohol use: No  . Drug use: No    Home Medications Prior to Admission medications   Medication Sig Start Date End Date Taking?  Authorizing Provider  acetaminophen (TYLENOL) 650 MG CR tablet Take 650 mg by mouth every 8 (eight) hours as needed for pain.    [provider]  ALPRAZolam [name](XANAX) 0.5 MG tablet Take 1 tablet (0.5 mg total) by mouth 3 (three) times daily as needed for anxiety. Patient taking differently: Take 0.25-0.5 mg by mouth 3 (three) times daily as needed for anxiety. 02/09/20   Almon HerculesGonfa, Taye T, MD  amLODipine (NORVASC) 5 MG tablet TAKE 1 TABLET (5 MG TOTAL) BY MOUTH DAILY. 09/20/20 09/20/21  Azucena FallenLancaster, William C, MD  budesonide-formoterol (SYMBICORT) 80-4.5 MCG/ACT inhaler Inhale 2 puffs into the lungs 2 (two) times daily. 11/22/20  Rodolph Bong, MD  butalbital-acetaminophen-caffeine (FIORICET) [phone] MG tablet Take 1 tablet by mouth every 8 (eight) hours as needed for headache or migraine. 02/09/20   Almon Hercules, MD  cholecalciferol (VITAMIN D) 1000 units tablet Take 1,000 Units by mouth daily.    [provider]  clopidogrel (PLAVIX) 75 MG tablet Take 1 tablet (75 mg total) by mouth daily. 09/24/20   Louk, Waylan Boga, PA-C  famotidine (PEPCID) 20 MG tablet Take 20 mg by mouth 2 (two) times daily. 07/16/20   [provider]  feeding supplement, ENSURE ENLIVE, (ENSURE ENLIVE) LIQD Take 237 mLs by mouth 2 (two) times daily between meals. Patient not taking: Reported on 11/19/2020 02/09/20   Almon Hercules, MD  ferrous sulfate 325 (65 FE) MG tablet Take 1 tablet (325 mg total) by mouth 2 (two) times daily with a meal. Patient not taking: Reported on 11/19/2020 02/09/20 08/07/20  Almon Hercules, MD  flecainide (TAMBOCOR) 50 MG tablet Take 1 tablet (50 mg total) by mouth 2 (two) times daily. Patient taking differently: Take 75 mg by mouth 2 (two) times daily. 07/29/20   Pricilla Riffle, MD  FLUoxetine (PROZAC) 20 MG capsule Take 20 mg by mouth 2 (two) times daily. 06/07/16   [provider]  L-Methylfolate-B6-B12 (FOLTX) 1.13-25-2 MG TABS Take 1 tablet by mouth daily. 10/09/20    [provider]  levalbuterol Pauline Aus HFA) 45 MCG/ACT inhaler Inhale 2 puffs into the lungs every 4 (four) hours as needed for wheezing. Use 2 times daily x4 days, then every 4 hours as needed. 11/22/20 11/22/21  Rodolph Bong, MD  losartan (COZAAR) 25 MG tablet Take 25 mg by mouth daily.    [provider]  metoprolol succinate (TOPROL-XL) 25 MG 24 hr tablet TAKE 1 TABLET BY MOUTH EVERY DAY AT BEDTIME Patient taking differently: Take 25 mg by mouth at bedtime. 06/20/20   Pricilla Riffle, MD  mirtazapine (REMERON) 7.5 MG tablet Take 7.5 mg by mouth at bedtime. 08/18/20   [provider]  Multiple Vitamin (MULTIVITAMIN WITH MINERALS) TABS tablet Take 1 tablet by mouth daily. Patient not taking: Reported on 11/19/2020 02/09/20   Almon Hercules, MD  omeprazole (PRILOSEC) 40 MG capsule Take 40 mg by mouth daily.    [provider]  Potassium 99 MG TABS Take 99 mg by mouth 2 (two) times daily.    [provider]  QUEtiapine (SEROQUEL) 25 MG tablet Take 1 tablet (25 mg total) by mouth 2 (two) times daily. Patient not taking: Reported on 11/19/2020 11/18/20   Anson Fret, MD  vitamin B-12 1000 MCG tablet Take 1 tablet (1,000 mcg total) by mouth daily. Patient not taking: Reported on 11/19/2020 02/09/20   Almon Hercules, MD    Allergies    Dobutamine, Epinephrine, Aspirin, Clarithromycin, Doxycycline, Excedrin extra strength [aspirin-acetaminophen-caffeine], Penicillins, Prednisone, and Propoxyphene  Review of Systems   Review of Systems  All other systems reviewed and are negative.   Physical Exam Updated Vital Signs BP (!) 202/84 (BP Location: Right Arm)   Pulse 65   Temp 98.9 F (37.2 C) (Oral)   Resp 20   SpO2 92%   Physical Exam Vitals and nursing note reviewed.  Constitutional:      Appearance: She is well-developed.     Comments: Elderly, frail  HENT:     Head: Normocephalic and atraumatic.     Right Ear: External ear normal.     Left  Ear: External  ear normal.  Eyes:     General: No scleral icterus.       Right eye: No discharge.        Left eye: No discharge.     Conjunctiva/sclera: Conjunctivae normal.  Neck:     Trachea: No tracheal deviation.  Cardiovascular:     Rate and Rhythm: Normal rate and regular rhythm.  Pulmonary:     Effort: Pulmonary effort is normal. No respiratory distress.     Breath sounds: Normal breath sounds. No stridor. No wheezing or rales.  Abdominal:     General: Bowel sounds are normal. There is no distension.     Palpations: Abdomen is soft.     Tenderness: There is no abdominal tenderness. There is no guarding or rebound.  Musculoskeletal:        General: Tenderness present.     Cervical back: Neck supple.     Comments: Mild tenderness palpation left knee as well as left hip, no shortening of the extremity, no effusion, no erythema, no spinal tenderness  Skin:    General: Skin is warm and dry.     Findings: No rash.  Neurological:     Mental Status: She is alert. Mental status is at baseline.     Cranial Nerves: No cranial nerve deficit (no facial droop, extraocular movements intact, no slurred speech).     Sensory: No sensory deficit.     Motor: No abnormal muscle tone or seizure activity.     Coordination: Coordination normal.     ED Results / Procedures / Treatments   Labs (all labs ordered are listed, but only abnormal results are displayed) Labs Reviewed - No data to display  EKG EKG Interpretation  Date/Time:  Wednesday November 27 2020 13:06:32 EDT Ventricular Rate:  64 PR Interval:  206 QRS Duration: 105 QT Interval:  496 QTC Calculation: 512 R Axis:   -56 Text Interpretation: Sinus rhythm Left anterior fascicular block Probable left ventricular hypertrophy Anterior Q waves, possibly due to LVH Prolonged QT interval No significant change since last tracing Confirmed by Linwood Dibbles [phone]) on 11/27/2020 1:09:17 PM   Radiology No results  found.  Procedures Procedures   Medications Ordered in ED Medications - No data to display  ED Course  I have reviewed the triage vital signs and the nursing notes.  Pertinent labs & imaging results that were available during my care of the patient were reviewed by me and considered in my medical decision making (see chart for details).  Clinical Course as of 11/28/20 1531  Wed Nov 27, 2020  1433 Patient's laboratory tests do show an elevated white blood cell count. [JK]  1434 Patient is not having any infectious symptoms.  Metabolic panel is normal. [JK]  1434 CT Cervical Spine Wo Contrast [JK]  1441 X-ray findings reviewed.  Left hip film concerning for the possibility of hip fracture.  MRI or CT recommended.  Unfortunately MRI is not available at this facility.  I discussed the options with the patient's daughter and she is comfortable with trying to do a CT scan here at this point [JK]  1525 CT scan does show evidence of hip fracture [JK]    Clinical Course User Index [JK] Linwood Dibbles, MD   MDM Rules/Calculators/A&P                          Pt with mechanical fall a few days earlier.  Daughter felt pt was having knee  pain.  On exam pt noted to have pain with hip movement.  CT ultimately demonstrated fx.  Pt transferred and admitted to the hospital for further treatment.  Final Clinical Impression(s) / ED Diagnoses Final diagnoses:  Closed fracture of neck of left femur, initial encounter [facility])    Rx / DC Orders ED Discharge Orders    None       Linwood Dibbles, MD 11/28/20 1534

## 2020-11-27 NOTE — ED Notes (Signed)
care link called to transport pt.  

## 2020-11-27 NOTE — Treatment Plan (Signed)
80 yo with hx dementia, anxiety, depression, brain aneurysm, CVA, CKD 3, COPD, GERD, paroxysmal SVT, orthostatic hypotension, MVP and other medical issues presenting with left hip fracture after Climmie Buelow fall.  Patient was recently discharged after hospitalization for CAP, improved after abx.  Fell on 4/2, day after discharge.  Labs notable for leukocytosis, presumably reactive.  CT L hip with acute mildly impacted and displaced fracture of the L femoral neck.  CT head and neck without acute intracranial abnormality or acute cervical spine fracture.  No fx, dislocation, or effusion on L knee plain film.  Vitals notable for hypertension, requested ensuring pt with adequate pain control and see if pt needs home meds?  Requested CXR.  Per EDP, Dr. Dion Saucier planning for surgery possibly tomorrow.

## 2020-11-27 NOTE — Consult Note (Addendum)
ORTHOPAEDIC CONSULTATION  REQUESTING PHYSICIAN: Zigmund Daniel., *  Chief Complaint: Fall  HPI: Barbara Hill is a 80 y.o. female with history of dementia, anxiety, depression, CVA, CKD 3, COPD, GERD, paroxysmal SVT, orthostatic hypotension, mitral valve prolapse, anemia who presented to the emergency department after a fall on April 2nd. Daughter states that patient was confused and walked outside on her own, which is not normal for her, and fell.  Since then she has been complaining to family that her left knee hurt.  She has been using ice but continued to have trouble with pain and weightbearing.  CT scan of left hip was performed showing a left femoral neck fracture.  She was transferred from [facility] to [facility] for definitive surgical management. Patient was recently just discharged on 11/22/2020 from [facility] due to pneumonia.  On exam today patient is pleasantly confused and does not complain of any pain at her left knee knee or left hip.  Daughter states that she walks with no assistive device at baseline. She is on Plavix for stroke prophylaxis. Patient lives with her daughter and daughter's family.   History provided by patient and daughter. Past Medical History:  Diagnosis Date  . Anxiety   . Brain aneurysm    pipeline stent 2017   . COPD (chronic obstructive pulmonary disease) (HCC)   . Fatigue   . H/O: hysterectomy   . Headache(784.0)   . History of cardiac catheterization    LHC 10/02: Normal coronary arteries  . History of nuclear stress test    Myoview 10/12: Normal stress nuclear study.  . Hypertension   . Mitral valve prolapse    Echo 8/17:Mild LVH, EF 60-65, normal wall motion, grade 2 diastolic dysfunction, mild AI, mild MR, mild LAE // echo 11/01: EF 60%, mild anterior MVP with trivial MR, PASP 35-40 mmHg, LAE  . Orthostasis   . PSVT (paroxysmal supraventricular tachycardia) (HCC)    palps  . Radiculopathy    Past  Surgical History:  Procedure Laterality Date  . ABDOMINAL HYSTERECTOMY    . APPENDECTOMY    . BUBBLE STUDY  01/29/2020   Procedure: BUBBLE STUDY;  Surgeon: Parke Poisson, MD;  Location: [facility] ENDOSCOPY;  Service: Cardiology;;  . CARDIAC CATHETERIZATION  12/95  . CATARACT EXTRACTION    . CHOLECYSTECTOMY    . EMBOLECTOMY Bilateral 01/25/2020   Procedure: Bilateral popliteal EMBOLECTOMY.;  Surgeon: [name], MD;  Location: [facility] OR;  Service: Vascular;  Laterality: Bilateral;  . FASCIOTOMY Bilateral 01/26/2020   Procedure: FASCIOTOMY;  Surgeon: [name], MD;  Location: [facility] OR;  Service: Vascular;  Laterality: Bilateral;  . IR GENERIC HISTORICAL  06/02/2016   IR RADIOLOGIST EVAL & MGMT 06/02/2016 [facility]  . IR GENERIC HISTORICAL  06/29/2016   IR ANGIO INTRA EXTRACRAN SEL INTERNAL CAROTID UNI R MOD SED 06/29/2016 [name], MD [facility]  . IR GENERIC HISTORICAL  06/29/2016   IR ANGIOGRAM FOLLOW UP STUDY 06/29/2016 [name], MD [facility]  . IR GENERIC HISTORICAL  06/29/2016   IR NEURO EACH ADD'L AFTER BASIC UNI RIGHT (MS) 06/29/2016 [name], MD [facility]  . IR GENERIC HISTORICAL  06/29/2016   IR 3D INDEPENDENT WKST 06/29/2016 [name], MD [facility]  . IR GENERIC HISTORICAL  06/29/2016   IR TRANSCATH/EMBOLIZ 06/29/2016 [name], MD [facility]  . IR GENERIC HISTORICAL  06/29/2016   IR ANGIO VERTEBRAL SEL SUBCLAVIAN INNOMINATE UNI R MOD SED  06/29/2016 [name], MD [facility]  . IR GENERIC HISTORICAL  07/28/2016   IR RADIOLOGIST EVAL & MGMT 07/28/2016 [facility]  . IR GENERIC HISTORICAL  11/12/2016   IR ANGIO INTRA EXTRACRAN SEL COM CAROTID INNOMINATE BILAT MOD SED 11/12/2016 [name], MD [facility]  . IR GENERIC HISTORICAL  11/12/2016   IR ANGIO VERTEBRAL SEL VERTEBRAL UNI L MOD SED 11/12/2016 [name], MD [facility]  . IR GENERIC HISTORICAL  11/12/2016   IR ANGIO VERTEBRAL SEL SUBCLAVIAN INNOMINATE  UNI R MOD SED 11/12/2016 [name], MD [facility]  . NASAL SEPTUM SURGERY    . RADIOLOGY WITH ANESTHESIA N/A 06/29/2016   Procedure: EMBOLIZATION;  Surgeon: [name], MD;  Location: [facility] OR;  Service: Radiology;  Laterality: N/A;  . ROTATOR CUFF REPAIR    . TEE WITHOUT CARDIOVERSION N/A 01/29/2020   Procedure: TRANSESOPHAGEAL ECHOCARDIOGRAM (TEE);  Surgeon: Parke Poisson, MD;  Location: [facility] ENDOSCOPY;  Service: Cardiology;  Laterality: N/A;   Social History   Socioeconomic History  . Marital status: Widowed    Spouse name: Not on file  . Number of children: 2  . Years of education: 25  . Highest education level: Not on file  Occupational History  . Occupation: Retired  Tobacco Use  . Smoking status: Former Smoker    Packs/day: 1.00    Years: 50.00    Pack years: 50.00    Types: Cigarettes    Quit date: 11/13/2020    Years since quitting: 0.0  . Smokeless tobacco: Never Used  . Tobacco comment: stopped with recent admn for PNA  Vaping Use  . Vaping Use: Never used  Substance and Sexual Activity  . Alcohol use: No  . Drug use: No  . Sexual activity: Not on file  Other Topics Concern  . Not on file  Social History Narrative   Lives with daughter and her family   Caffeine use: none   Social Determinants of Health   Financial Resource Strain: Not on file  Food Insecurity: Not on file  Transportation Needs: Not on file  Physical Activity: Not on file  Stress: Not on file  Social Connections: Not on file   Family History  Problem Relation Age of Onset  . Heart attack Mother   . Transient ischemic attack Mother   . Dementia Mother        "at the end"  . Heart attack Father 40  . Dementia Brother   . Heart attack Other   . Asthma Other   . Heart failure Other   . Osteoporosis Other   . Heart Problems Brother   . CAD Other    Allergies  Allergen Reactions  . Dobutamine Other (See Comments)    Heart beating hard with CP, neck pain, and weakness   . Epinephrine Other (See Comments)    Fast heart beat  . Aspirin Nausea And Vomiting    History of stomach ulcer  . Clarithromycin Other (See Comments)    Yeast infection  . Doxycycline Other (See Comments)    Makes stomach hurt  . Excedrin Extra Strength [Aspirin-Acetaminophen-Caffeine] Nausea And Vomiting  . Penicillins Other (See Comments)    Yeast infection  . Prednisone Other (See Comments)    Shakes   . Propoxyphene Nausea And Vomiting     Positive ROS: All other systems have been reviewed and were otherwise negative with the exception of those mentioned in the HPI and as above.  Physical Exam: General: Alert, elderly  female, no acute distress, sitting up in bed.  Cardiovascular: No pedal edema Respiratory: No cyanosis, no use of accessory musculature GI: No organomegaly, abdomen is soft and non-tender Skin: No lesions noted at left hip.  No erythema or ecchymosis to left hip. Neurologic: Sensation intact distally Psychiatric: pleasantly confused.  Oriented to self only. Lymphatic: No axillary or cervical lymphadenopathy  MUSCULOSKELETAL: No shortening of left lower extremity.  No tenderness to palpation to any aspects of left knee or greater trochanter of left hip.  Dorsiflexion and plantarflexion intact bilaterally.  Patient able to move all toes of bilateral feet.  Patient endorses sensation to all aspects of bilateral feet.   Imaging:  Ct Left hip: Acute mildly impacted and displaced fracture of the left femoral Neck with underlying moderate left hip osteoarthritis.    Assessment: Left femoral neck fracture -Plan for left hip percutaneous pinning by Dr. Landau tomorrow afternoon - Risks, benefits, and alternative of a left hip percutaneous pinning were discussed with patient's daughter by phone Rhonda Cunningham, who would like to move forward with surgery -Nonweightbearing left lower extremity -N.p.o. after midnight   Vernelle Wisner K Avonte Sensabaugh, PA-C    11/27/2020 7:53  PM  

## 2020-11-27 NOTE — ED Provider Notes (Signed)
  Physical Exam  BP (!) 193/76   Pulse 66   Temp 98.9 F (37.2 C) (Oral)   Resp 12   Ht 5\' 3"  (1.6 m)   Wt 42.3 kg   SpO2 94%   BMI 16.53 kg/m   Physical Exam  ED Course/Procedures   Clinical Course as of 11/27/20 1529  Wed Nov 27, 2020  1433 Patient's laboratory tests do show an elevated white blood cell count. [JK]  1434 Patient is not having any infectious symptoms.  Metabolic panel is normal. [JK]  1434 CT Cervical Spine Wo Contrast [JK]  1441 X-ray findings reviewed.  Left hip film concerning for the possibility of hip fracture.  MRI or CT recommended.  Unfortunately MRI is not available at this facility.  I discussed the options with the patient's daughter and she is comfortable with trying to do a CT scan here at this point [JK]  1525 CT scan does show evidence of hip fracture [JK]    Clinical Course User Index [JK] Nov 29, 2020, MD    Procedures  MDM    Received care of patient from Dr. Linwood Dibbles.  Please see his note for prior history, physical and care.  Briefly this is a 80 year old female with a history of hypertension, CVA, cerebral aneurysm, CKD stage III, MVP, dementia, COPD, paroxysmal SVT, recent admission from March 28 to April 1 for community-acquired pneumonia who presents with concern for fall 4/2 with persistent left hip pain. CT shows mildly displaced left femoral neck fracture.  Family denies other acute medical concerns today, notes recent hospitalization.  Discussed with Dr. 6/2 Orthopedics who plans on [facility] OR tomorrow AM.  Admitted to hospitalist for further care.    [facility], MD 11/28/20 1053

## 2020-11-27 NOTE — ED Notes (Signed)
Patient transported to CT 

## 2020-11-27 NOTE — ED Triage Notes (Addendum)
Per daughter/pt with dementia hx-pt fell 4/2-no LOC-pain "to her whole left side"-pt was lifted from car to w/c-from wc/ to stretcher-NAD-daughter states EMS placed pt in car today for transport to ED by POV

## 2020-11-27 NOTE — ED Notes (Signed)
Attempted to get vitals. Pt currently out of room for scan. Will get vitals when pt returns.

## 2020-11-27 NOTE — ED Notes (Signed)
MD notified of elevated BP.

## 2020-11-28 ENCOUNTER — Inpatient Hospital Stay (HOSPITAL_COMMUNITY): Payer: Medicare Other

## 2020-11-28 ENCOUNTER — Inpatient Hospital Stay (HOSPITAL_COMMUNITY): Payer: Medicare Other | Admitting: Certified Registered Nurse Anesthetist

## 2020-11-28 ENCOUNTER — Encounter (HOSPITAL_COMMUNITY): Payer: Self-pay | Admitting: Family Medicine

## 2020-11-28 ENCOUNTER — Encounter (HOSPITAL_COMMUNITY)
Admission: EM | Disposition: A | Discharge status: Hospice | Payer: Self-pay | Source: Home / Self Care | Attending: Internal Medicine

## 2020-11-28 DIAGNOSIS — I10 Essential (primary) hypertension: Secondary | ICD-10-CM

## 2020-11-28 DIAGNOSIS — I471 Supraventricular tachycardia: Secondary | ICD-10-CM

## 2020-11-28 DIAGNOSIS — S72002A Fracture of unspecified part of neck of left femur, initial encounter for closed fracture: Secondary | ICD-10-CM

## 2020-11-28 DIAGNOSIS — N1831 Chronic kidney disease, stage 3a: Secondary | ICD-10-CM | POA: Diagnosis not present

## 2020-11-28 DIAGNOSIS — R9431 Abnormal electrocardiogram [ECG] [EKG]: Secondary | ICD-10-CM

## 2020-11-28 HISTORY — PX: HIP PINNING,CANNULATED: SHX1758

## 2020-11-28 LAB — CBC
HCT: 35 % — ABNORMAL LOW (ref 36.0–46.0)
Hemoglobin: 11.6 g/dL — ABNORMAL LOW (ref 12.0–15.0)
MCH: 30.1 pg (ref 26.0–34.0)
MCHC: 33.1 g/dL (ref 30.0–36.0)
MCV: 90.7 fL (ref 80.0–100.0)
Platelets: 528 10*3/uL — ABNORMAL HIGH (ref 150–400)
RBC: 3.86 MIL/uL — ABNORMAL LOW (ref 3.87–5.11)
RDW: 13.8 % (ref 11.5–15.5)
WBC: 18.6 10*3/uL — ABNORMAL HIGH (ref 4.0–10.5)
nRBC: 0 % (ref 0.0–0.2)

## 2020-11-28 LAB — SURGICAL PCR SCREEN
MRSA, PCR: NEGATIVE
Staphylococcus aureus: NEGATIVE

## 2020-11-28 LAB — TYPE AND SCREEN
ABO/RH(D): AB POS
Antibody Screen: NEGATIVE

## 2020-11-28 LAB — BASIC METABOLIC PANEL
Anion gap: 12 (ref 5–15)
BUN: 20 mg/dL (ref 8–23)
CO2: 25 mmol/L (ref 22–32)
Calcium: 9.6 mg/dL (ref 8.9–10.3)
Chloride: 101 mmol/L (ref 98–111)
Creatinine, Ser: 0.87 mg/dL (ref 0.44–1.00)
GFR, Estimated: >60 mL/min (ref >60)
Glucose, Bld: 107 mg/dL — ABNORMAL HIGH (ref 70–99)
Potassium: 3.5 mmol/L (ref 3.5–5.1)
Sodium: 138 mmol/L (ref 135–145)

## 2020-11-28 LAB — MAGNESIUM: Magnesium: 1.5 mg/dL — ABNORMAL LOW (ref 1.7–2.4)

## 2020-11-28 SURGERY — FIXATION, FEMUR, NECK, PERCUTANEOUS, USING SCREW
Anesthesia: General | Site: Hip | Laterality: Left

## 2020-11-28 MED ORDER — LABETALOL HCL 5 MG/ML IV SOLN
INTRAVENOUS | Status: AC
  Administered 2020-11-28: 10 mg via INTRAVENOUS
  Filled 2020-11-28: qty 4

## 2020-11-28 MED ORDER — DEXAMETHASONE SODIUM PHOSPHATE 10 MG/ML IJ SOLN
INTRAMUSCULAR | Status: AC
  Filled 2020-11-28: qty 1

## 2020-11-28 MED ORDER — OXYCODONE HCL 5 MG/5ML PO SOLN: 5.0000 mg | Freq: Once | ORAL | Status: DC | PRN

## 2020-11-28 MED ORDER — FAMOTIDINE 20 MG PO TABS
20.0000 mg | ORAL_TABLET | Freq: Every day | ORAL | Status: DC
  Administered 2020-11-28 – 2020-12-04 (×7): 20 mg via ORAL
  Filled 2020-11-28 (×7): qty 1

## 2020-11-28 MED ORDER — BISACODYL 10 MG RE SUPP
10.0000 mg | Freq: Every day | RECTAL | Status: DC | PRN
  Administered 2020-11-30: 10 mg via RECTAL
  Filled 2020-11-28 (×3): qty 1

## 2020-11-28 MED ORDER — ROCURONIUM BROMIDE 10 MG/ML (PF) SYRINGE
PREFILLED_SYRINGE | INTRAVENOUS | Status: DC | PRN
  Administered 2020-11-28 (×2): 5 mg via INTRAVENOUS
  Administered 2020-11-28: 50 mg via INTRAVENOUS

## 2020-11-28 MED ORDER — FENTANYL CITRATE (PF) 100 MCG/2ML IJ SOLN
INTRAMUSCULAR | Status: AC
  Filled 2020-11-28: qty 2

## 2020-11-28 MED ORDER — ALPRAZOLAM 0.5 MG PO TABS
0.5000 mg | ORAL_TABLET | Freq: Three times a day (TID) | ORAL | Status: DC | PRN
  Administered 2020-11-28 – 2020-12-04 (×7): 0.5 mg via ORAL
  Filled 2020-11-28 (×8): qty 1

## 2020-11-28 MED ORDER — DEXAMETHASONE SODIUM PHOSPHATE 4 MG/ML IJ SOLN
INTRAMUSCULAR | Status: DC | PRN
  Administered 2020-11-28: 4 mg via INTRAVENOUS

## 2020-11-28 MED ORDER — PHENYLEPHRINE HCL-NACL 10-0.9 MG/250ML-% IV SOLN
INTRAVENOUS | Status: DC | PRN
  Administered 2020-11-28: 20 ug/min via INTRAVENOUS

## 2020-11-28 MED ORDER — ONDANSETRON HCL 4 MG/2ML IJ SOLN
INTRAMUSCULAR | Status: DC | PRN
  Administered 2020-11-28: 4 mg via INTRAVENOUS

## 2020-11-28 MED ORDER — ONDANSETRON HCL 4 MG/2ML IJ SOLN
INTRAMUSCULAR | Status: AC
  Filled 2020-11-28: qty 2

## 2020-11-28 MED ORDER — HYDROCODONE-ACETAMINOPHEN 5-325 MG PO TABS: 1.0000 | ORAL_TABLET | Freq: Four times a day (QID) | ORAL | Status: DC | PRN

## 2020-11-28 MED ORDER — FERROUS SULFATE 325 (65 FE) MG PO TABS
325.0000 mg | ORAL_TABLET | Freq: Three times a day (TID) | ORAL | Status: DC
  Administered 2020-11-29 – 2020-12-04 (×13): 325 mg via ORAL
  Filled 2020-11-28 (×13): qty 1

## 2020-11-28 MED ORDER — 0.9 % SODIUM CHLORIDE (POUR BTL) OPTIME
TOPICAL | Status: DC | PRN
  Administered 2020-11-28: 1000 mL

## 2020-11-28 MED ORDER — FENTANYL CITRATE (PF) 100 MCG/2ML IJ SOLN
INTRAMUSCULAR | Status: DC | PRN
  Administered 2020-11-28: 25 ug via INTRAVENOUS
  Administered 2020-11-28 (×2): 50 ug via INTRAVENOUS

## 2020-11-28 MED ORDER — SENNOSIDES-DOCUSATE SODIUM 8.6-50 MG PO TABS: 1.0000 | ORAL_TABLET | Freq: Every evening | ORAL | Status: DC | PRN

## 2020-11-28 MED ORDER — METOPROLOL SUCCINATE ER 25 MG PO TB24
25.0000 mg | ORAL_TABLET | Freq: Every day | ORAL | Status: DC
  Administered 2020-11-28: 25 mg via ORAL
  Filled 2020-11-28: qty 1

## 2020-11-28 MED ORDER — PROPOFOL 10 MG/ML IV BOLUS
INTRAVENOUS | Status: AC
  Filled 2020-11-28: qty 20

## 2020-11-28 MED ORDER — CHLORHEXIDINE GLUCONATE CLOTH 2 % EX PADS
6.0000 | MEDICATED_PAD | Freq: Every day | CUTANEOUS | Status: DC
  Administered 2020-11-28 – 2020-12-04 (×5): 6 via TOPICAL

## 2020-11-28 MED ORDER — CHLORHEXIDINE GLUCONATE 0.12 % MT SOLN: 15.0000 mL | Freq: Once | OROMUCOSAL | Status: DC

## 2020-11-28 MED ORDER — SUGAMMADEX SODIUM 200 MG/2ML IV SOLN
INTRAVENOUS | Status: DC | PRN
  Administered 2020-11-28 (×2): 50 mg via INTRAVENOUS

## 2020-11-28 MED ORDER — LIDOCAINE 2% (20 MG/ML) 5 ML SYRINGE
INTRAMUSCULAR | Status: AC
  Filled 2020-11-28: qty 5

## 2020-11-28 MED ORDER — ACETAMINOPHEN 500 MG PO TABS: 500.0000 mg | ORAL_TABLET | Freq: Four times a day (QID) | ORAL | Status: DC

## 2020-11-28 MED ORDER — BUPIVACAINE HCL (PF) 0.25 % IJ SOLN
INTRAMUSCULAR | Status: DC | PRN
  Administered 2020-11-28: 10 mL

## 2020-11-28 MED ORDER — DOCUSATE SODIUM 100 MG PO CAPS
100.0000 mg | ORAL_CAPSULE | Freq: Two times a day (BID) | ORAL | Status: DC
  Administered 2020-11-28 – 2020-12-01 (×5): 100 mg via ORAL
  Filled 2020-11-28 (×7): qty 1

## 2020-11-28 MED ORDER — LABETALOL HCL 5 MG/ML IV SOLN: 10.0000 mg | Freq: Once | INTRAVENOUS | Status: AC

## 2020-11-28 MED ORDER — LACTATED RINGERS IV SOLN: INTRAVENOUS | Status: DC

## 2020-11-28 MED ORDER — CLOPIDOGREL BISULFATE 75 MG PO TABS
75.0000 mg | ORAL_TABLET | Freq: Every day | ORAL | Status: DC
  Filled 2020-11-28: qty 1

## 2020-11-28 MED ORDER — SENNA 8.6 MG PO TABS
1.0000 | ORAL_TABLET | Freq: Two times a day (BID) | ORAL | Status: DC
  Administered 2020-11-28 – 2020-12-04 (×11): 8.6 mg via ORAL
  Filled 2020-11-28 (×11): qty 1

## 2020-11-28 MED ORDER — MUPIROCIN 2 % EX OINT
1.0000 "application " | TOPICAL_OINTMENT | Freq: Two times a day (BID) | CUTANEOUS | Status: AC
  Administered 2020-11-28 – 2020-12-02 (×6): 1 via NASAL
  Filled 2020-11-28 (×2): qty 22

## 2020-11-28 MED ORDER — OXYCODONE HCL 5 MG PO TABS: 5.0000 mg | ORAL_TABLET | Freq: Once | ORAL | Status: DC | PRN

## 2020-11-28 MED ORDER — PROPOFOL 10 MG/ML IV BOLUS
INTRAVENOUS | Status: DC | PRN
  Administered 2020-11-28: 30 mg via INTRAVENOUS
  Administered 2020-11-28: 50 mg via INTRAVENOUS

## 2020-11-28 MED ORDER — MAGNESIUM SULFATE 4 GM/100ML IV SOLN
4.0000 g | Freq: Once | INTRAVENOUS | Status: AC
  Administered 2020-11-28: 4 g via INTRAVENOUS
  Filled 2020-11-28: qty 100

## 2020-11-28 MED ORDER — SODIUM CHLORIDE 0.9 % IV SOLN
INTRAVENOUS | Status: DC | PRN
  Administered 2020-11-28: 250 mL via INTRAVENOUS

## 2020-11-28 MED ORDER — ENOXAPARIN SODIUM 40 MG/0.4ML ~~LOC~~ SOLN
40.0000 mg | SUBCUTANEOUS | Status: DC
  Administered 2020-11-29 – 2020-12-04 (×4): 40 mg via SUBCUTANEOUS
  Filled 2020-11-28 (×5): qty 0.4

## 2020-11-28 MED ORDER — ONDANSETRON HCL 4 MG PO TABS: 4.0000 mg | ORAL_TABLET | Freq: Two times a day (BID) | ORAL | Status: DC

## 2020-11-28 MED ORDER — BUPIVACAINE HCL (PF) 0.25 % IJ SOLN
INTRAMUSCULAR | Status: AC
  Filled 2020-11-28: qty 30

## 2020-11-28 MED ORDER — AMLODIPINE BESYLATE 5 MG PO TABS
5.0000 mg | ORAL_TABLET | Freq: Every day | ORAL | Status: DC
  Administered 2020-11-28 – 2020-11-29 (×2): 5 mg via ORAL
  Filled 2020-11-28 (×2): qty 1

## 2020-11-28 MED ORDER — FLUTICASONE FUROATE-VILANTEROL 100-25 MCG/INH IN AEPB
1.0000 | INHALATION_SPRAY | Freq: Every day | RESPIRATORY_TRACT | Status: DC
  Administered 2020-11-28 – 2020-12-03 (×6): 1 via RESPIRATORY_TRACT
  Filled 2020-11-28 (×2): qty 28

## 2020-11-28 MED ORDER — MORPHINE SULFATE (PF) 2 MG/ML IV SOLN
2.0000 mg | INTRAVENOUS | Status: DC | PRN
  Administered 2020-11-29: 2 mg via INTRAVENOUS
  Filled 2020-11-28: qty 1

## 2020-11-28 MED ORDER — HYDROMORPHONE HCL 1 MG/ML IJ SOLN: 0.5000 mg | INTRAMUSCULAR | Status: DC | PRN

## 2020-11-28 MED ORDER — LACTATED RINGERS IV SOLN: INTRAVENOUS | Status: DC | PRN

## 2020-11-28 MED ORDER — MAGNESIUM CITRATE PO SOLN: 1.0000 | Freq: Once | ORAL | Status: DC | PRN

## 2020-11-28 MED ORDER — LIP MEDEX EX OINT
TOPICAL_OINTMENT | CUTANEOUS | Status: AC
  Filled 2020-11-28: qty 7

## 2020-11-28 MED ORDER — ALUM & MAG HYDROXIDE-SIMETH 200-200-20 MG/5ML PO SUSP: 30.0000 mL | ORAL | Status: DC | PRN

## 2020-11-28 MED ORDER — ROCURONIUM BROMIDE 10 MG/ML (PF) SYRINGE
PREFILLED_SYRINGE | INTRAVENOUS | Status: AC
  Filled 2020-11-28: qty 10

## 2020-11-28 MED ORDER — ACETAMINOPHEN 325 MG PO TABS: 325.0000 mg | ORAL_TABLET | Freq: Four times a day (QID) | ORAL | Status: DC | PRN

## 2020-11-28 MED ORDER — FENTANYL CITRATE (PF) 100 MCG/2ML IJ SOLN: 25.0000 ug | INTRAMUSCULAR | Status: DC | PRN

## 2020-11-28 MED ORDER — HEPARIN SODIUM (PORCINE) 5000 UNIT/ML IJ SOLN: 5000.0000 [IU] | Freq: Three times a day (TID) | INTRAMUSCULAR | Status: DC

## 2020-11-28 MED ORDER — PHENOL 1.4 % MT LIQD: 1.0000 | OROMUCOSAL | Status: DC | PRN

## 2020-11-28 MED ORDER — POLYETHYLENE GLYCOL 3350 17 G PO PACK: 17.0000 g | PACK | Freq: Every day | ORAL | Status: DC | PRN

## 2020-11-28 MED ORDER — POTASSIUM CHLORIDE IN NACL 20-0.45 MEQ/L-% IV SOLN
INTRAVENOUS | Status: DC
  Filled 2020-11-28: qty 1000

## 2020-11-28 MED ORDER — LIDOCAINE 2% (20 MG/ML) 5 ML SYRINGE
INTRAMUSCULAR | Status: DC | PRN
  Administered 2020-11-28: 40 mg via INTRAVENOUS

## 2020-11-28 MED ORDER — POTASSIUM CHLORIDE 10 MEQ/100ML IV SOLN
10.0000 meq | INTRAVENOUS | Status: AC
  Administered 2020-11-28 (×2): 10 meq via INTRAVENOUS
  Filled 2020-11-28: qty 100

## 2020-11-28 MED ORDER — LEVALBUTEROL TARTRATE 45 MCG/ACT IN AERO
2.0000 | INHALATION_SPRAY | RESPIRATORY_TRACT | Status: DC | PRN
  Filled 2020-11-28: qty 15

## 2020-11-28 MED ORDER — CEFAZOLIN SODIUM-DEXTROSE 2-4 GM/100ML-% IV SOLN
2.0000 g | Freq: Four times a day (QID) | INTRAVENOUS | Status: AC
  Administered 2020-11-28 – 2020-11-29 (×2): 2 g via INTRAVENOUS
  Filled 2020-11-28 (×3): qty 100

## 2020-11-28 MED ORDER — ONDANSETRON HCL 4 MG/2ML IJ SOLN
4.0000 mg | Freq: Two times a day (BID) | INTRAMUSCULAR | Status: DC
  Administered 2020-11-28: 4 mg via INTRAVENOUS
  Filled 2020-11-28 (×2): qty 2

## 2020-11-28 MED ORDER — MENTHOL 3 MG MT LOZG: 1.0000 | LOZENGE | OROMUCOSAL | Status: DC | PRN

## 2020-11-28 SURGICAL SUPPLY — 47 items
BAG SPEC THK2 15X12 ZIP CLS (MISCELLANEOUS) ×1
BAG ZIPLOCK 12X15 (MISCELLANEOUS) ×2 IMPLANT
BIT DRILL CANNULATED (DRILL) IMPLANT
BNDG COHESIVE 6X5 TAN STRL LF (GAUZE/BANDAGES/DRESSINGS) ×2 IMPLANT
CLSR STERI-STRIP ANTIMIC 1/2X4 (GAUZE/BANDAGES/DRESSINGS) ×1 IMPLANT
COVER SURGICAL LIGHT HANDLE (MISCELLANEOUS) ×2 IMPLANT
COVER WAND RF STERILE (DRAPES) IMPLANT
DECANTER SPIKE VIAL GLASS SM (MISCELLANEOUS) ×2 IMPLANT
DRAPE STERI IOBAN 125X83 (DRAPES) ×2 IMPLANT
DRESSING MEPILEX FLEX 4X4 (GAUZE/BANDAGES/DRESSINGS) IMPLANT
DRILL CANNULATED (DRILL) ×2
DRSG AQUACEL AG ADV 3.5X 4 (GAUZE/BANDAGES/DRESSINGS) ×4 IMPLANT
DRSG MEPILEX FLEX 4X4 (GAUZE/BANDAGES/DRESSINGS) ×2
DURAPREP 26ML APPLICATOR (WOUND CARE) ×2 IMPLANT
ELECT REM PT RETURN 15FT ADLT (MISCELLANEOUS) ×2 IMPLANT
FACESHIELD WRAPAROUND (MASK) ×4 IMPLANT
FACESHIELD WRAPAROUND OR TEAM (MASK) ×2 IMPLANT
GLOVE ORTHO TXT STRL SZ7.5 (GLOVE) ×2 IMPLANT
GLOVE SRG 8 PF TXTR STRL LF DI (GLOVE) ×1 IMPLANT
GLOVE SURG ENC MOIS LTX SZ7 (GLOVE) ×2 IMPLANT
GLOVE SURG UNDER POLY LF SZ7 (GLOVE) ×2 IMPLANT
GLOVE SURG UNDER POLY LF SZ8 (GLOVE) ×2
GOWN STRL REUS W/ TWL LRG LVL3 (GOWN DISPOSABLE) ×2 IMPLANT
GOWN STRL REUS W/TWL LRG LVL3 (GOWN DISPOSABLE) ×6 IMPLANT
GUIDE PIN 3.2MM (PIN) ×2
GUIDE PIN ORTH 12X3.2X (PIN) IMPLANT
KIT BASIN OR (CUSTOM PROCEDURE TRAY) ×2 IMPLANT
KIT TURNOVER KIT A (KITS) ×2 IMPLANT
NEEDLE HYPO 22GX1.5 SAFETY (NEEDLE) ×2 IMPLANT
NS IRRIG 1000ML POUR BTL (IV SOLUTION) ×2 IMPLANT
PACK GENERAL/GYN (CUSTOM PROCEDURE TRAY) ×2 IMPLANT
PENCIL SMOKE EVACUATOR (MISCELLANEOUS) IMPLANT
PROTECTOR NERVE ULNAR (MISCELLANEOUS) ×2 IMPLANT
SCREW CANN RVR CUT FLUT 90X16X (Screw) IMPLANT
SCREW CANN RVRS CUT FLT 85X16 (Screw) IMPLANT
SCREW CANNULATED 6.5X85MM (Screw) ×4 IMPLANT
SCREW CANNULATED 6.5X90 (Screw) ×2 IMPLANT
STRIP CLOSURE SKIN 1/2X4 (GAUZE/BANDAGES/DRESSINGS) ×2 IMPLANT
SUT VIC AB 0 CT1 27 (SUTURE) ×2
SUT VIC AB 0 CT1 27XBRD ANTBC (SUTURE) ×1 IMPLANT
SUT VIC AB 3-0 SH 8-18 (SUTURE) ×2 IMPLANT
SYR 20ML LL LF (SYRINGE) ×2 IMPLANT
TOWEL OR 17X26 10 PK STRL BLUE (TOWEL DISPOSABLE) ×2 IMPLANT
TOWEL OR NON WOVEN STRL DISP B (DISPOSABLE) ×2 IMPLANT
TRAY FOLEY MTR SLVR 16FR STAT (SET/KITS/TRAYS/PACK) ×1 IMPLANT
WASHER 5.5MM STAINLESS STEEL (Washer) ×1 IMPLANT
WATER STERILE IRR 1000ML POUR (IV SOLUTION) ×1 IMPLANT

## 2020-11-28 NOTE — H&P (Signed)
History and Physical        Hospital Admission Note Date: 11/28/2020  Patient name: Barbara Hill Medical record number: [id number] Date of birth: [birth year] Age: 80 y.o. Gender: female  PCP: Richmond Campbell., PA-C   Chief Complaint    Chief Complaint  Patient presents with  . Fall      HPI:   This is a 80 year old female with past medical history of dementia, anxiety, depression, CVA, CKD 3, cerebral aneurysm, COPD, GERD, paroxysmal SVT, orthostatic hypotension, mitral valve prolapse, recent hospitalization from 3/28-4/1 for CAP who presented to [facility] for evaluation of knee pain following a fall on April 2.  Patient lives with her daughter who stated that the patient was sitting on the couch yesterday and her husband went to the bathroom and when he came out she was not on the couch anymore.  He found her lying on the floor outside after trying to go down 2 steps.  She says that the patient is always under supervision and never goes outside.  Has been complaining of knee pain since and difficulty ambulating.  She has been using OTC pain meds and ice without improvement.  Apparently hit her head as well.  She was found to have left hip fracture on XR which was confirmed by CT and she was transferred to [facility] for further management.  Patient currently says that she has persistent left hip pain but otherwise has no complaints.   ED Course: Afebrile, hypertensive, on room air. Notable Labs: WBC 17.0, platelets 617, COVID-19 negative and labs otherwise unremarkable.  Notable Imaging: Left hip XR-questionable incomplete fracture of the medial aspect of left femoral head.  CT left hip-acute mildly impacted and displaced fracture of the left femoral neck and moderate left hip osteoarthritis.  CT head and neck and left hip XR unremarkable.  Orthopedic surgery was consulted by the ED provider.   Patient received amlodipine, fentanyl.    Vitals:   11/28/20 0600 11/28/20 0958  BP:  (!) 166/77  Pulse:    Resp: 18   Temp:    SpO2: 94%      Review of Systems:  Review of Systems  All other systems reviewed and are negative.   Medical/Social/Family History   Past Medical History: Past Medical History:  Diagnosis Date  . Anxiety   . Brain aneurysm    pipeline stent 2017   . COPD (chronic obstructive pulmonary disease) (HCC)   . Fatigue   . H/O: hysterectomy   . Headache(784.0)   . History of cardiac catheterization    LHC 10/02: Normal coronary arteries  . History of nuclear stress test    Myoview 10/12: Normal stress nuclear study.  . Hypertension   . Mitral valve prolapse    Echo 8/17:Mild LVH, EF 60-65, normal wall motion, grade 2 diastolic dysfunction, mild AI, mild MR, mild LAE // echo 11/01: EF 60%, mild anterior MVP with trivial MR, PASP 35-40 mmHg, LAE  . Orthostasis   . PSVT (paroxysmal supraventricular tachycardia) (HCC)    palps  . Radiculopathy     Past Surgical History:  Procedure Laterality Date  . ABDOMINAL HYSTERECTOMY    . APPENDECTOMY    . BUBBLE STUDY  01/29/2020   Procedure: BUBBLE STUDY;  Surgeon: Parke PoissonAcharya, Gayatri A, MD;  [facility];  Service: Cardiology;;  . CARDIAC CATHETERIZATION  12/95  . CATARACT EXTRACTION    . CHOLECYSTECTOMY    . EMBOLECTOMY Bilateral 01/25/2020   Procedure: Bilateral popliteal EMBOLECTOMY.;  Surgeon: Larina EarthlyEarly, Todd F, MD;  [facility];  Service: Vascular;  Laterality: Bilateral;  . FASCIOTOMY Bilateral 01/26/2020   Procedure: FASCIOTOMY;  Surgeon: Larina EarthlyEarly, Todd F, MD;  [facility];  Service: Vascular;  Laterality: Bilateral;  . IR GENERIC HISTORICAL  06/02/2016   IR RADIOLOGIST EVAL & MGMT 06/02/2016 [facility]  . IR GENERIC HISTORICAL  06/29/2016   IR ANGIO INTRA EXTRACRAN SEL INTERNAL CAROTID UNI R MOD SED 06/29/2016 Julieanne CottonSanjeev Deveshwar, MD [facility]  . IR GENERIC HISTORICAL  06/29/2016   IR  ANGIOGRAM FOLLOW UP STUDY 06/29/2016 Julieanne CottonSanjeev Deveshwar, MD [facility]  . IR GENERIC HISTORICAL  06/29/2016   IR NEURO EACH ADD'L AFTER BASIC UNI RIGHT (MS) 06/29/2016 Julieanne CottonSanjeev Deveshwar, MD [facility]  . IR GENERIC HISTORICAL  06/29/2016   IR 3D INDEPENDENT WKST 06/29/2016 Julieanne CottonSanjeev Deveshwar, MD [facility]  . IR GENERIC HISTORICAL  06/29/2016   IR TRANSCATH/EMBOLIZ 06/29/2016 Julieanne CottonSanjeev Deveshwar, MD [facility]  . IR GENERIC HISTORICAL  06/29/2016   IR ANGIO VERTEBRAL SEL SUBCLAVIAN INNOMINATE UNI R MOD SED 06/29/2016 Julieanne CottonSanjeev Deveshwar, MD [facility]  . IR GENERIC HISTORICAL  07/28/2016   IR RADIOLOGIST EVAL & MGMT 07/28/2016 [facility]  . IR GENERIC HISTORICAL  11/12/2016   IR ANGIO INTRA EXTRACRAN SEL COM CAROTID INNOMINATE BILAT MOD SED 11/12/2016 Julieanne CottonSanjeev Deveshwar, MD [facility]  . IR GENERIC HISTORICAL  11/12/2016   IR ANGIO VERTEBRAL SEL VERTEBRAL UNI L MOD SED 11/12/2016 Julieanne CottonSanjeev Deveshwar, MD [facility]  . IR GENERIC HISTORICAL  11/12/2016   IR ANGIO VERTEBRAL SEL SUBCLAVIAN INNOMINATE UNI R MOD SED 11/12/2016 Julieanne CottonSanjeev Deveshwar, MD [facility]  . NASAL SEPTUM SURGERY    . RADIOLOGY WITH ANESTHESIA N/A 06/29/2016   Procedure: EMBOLIZATION;  Surgeon: Julieanne CottonSanjeev Deveshwar, MD;  Location: [facility] OR;  Service: Radiology;  Laterality: N/A;  . ROTATOR CUFF REPAIR    . TEE WITHOUT CARDIOVERSION N/A 01/29/2020   Procedure: TRANSESOPHAGEAL ECHOCARDIOGRAM (TEE);  Surgeon: Parke PoissonAcharya, Gayatri A, MD;  [facility];  Service: Cardiology;  Laterality: N/A;    Medications: Prior to Admission medications   Medication Sig Start Date End Date Taking? Authorizing Provider  acetaminophen (TYLENOL) 500 MG tablet Take 1,000 mg by mouth every 6 (six) hours as needed for moderate pain.   Yes [provider]  ALPRAZolam (XANAX) 0.5 MG tablet Take 1 tablet (0.5 mg total) by mouth 3 (three) times daily as needed for anxiety. Patient taking differently: Take 0.25-0.5 mg by mouth 3 (three) times  daily as needed for anxiety. 02/09/20  Yes Gonfa, Boyce Mediciaye T, MD  amLODipine (NORVASC) 5 MG tablet TAKE 1 TABLET (5 MG TOTAL) BY MOUTH DAILY. 09/20/20 09/20/21 Yes Azucena FallenLancaster, William C, MD  budesonide-formoterol [facility](SYMBICORT) 80-4.5 MCG/ACT inhaler Inhale 2 puffs into the lungs 2 (two) times daily. 11/22/20  Yes Rodolph Bonghompson, Daniel V, MD  butalbital-acetaminophen-caffeine (FIORICET) [phone] MG tablet Take 1 tablet by mouth every 8 (eight) hours as needed for headache or migraine. 02/09/20  Yes Almon HerculesGonfa, Taye T, MD  cholecalciferol (VITAMIN D) 1000 units tablet Take 1,000 Units by mouth daily.   Yes [provider]  clopidogrel (PLAVIX) 75 MG tablet Take 1 tablet (75 mg total) by mouth daily. 09/24/20  Yes Louk,  Waylan Boga, PA-C  famotidine (PEPCID) 20 MG tablet Take 20 mg by mouth 2 (two) times daily. 07/16/20  Yes [provider]  flecainide (TAMBOCOR) 50 MG tablet Take 1 tablet (50 mg total) by mouth 2 (two) times daily. 07/29/20  Yes Pricilla Riffle, MD  FLUoxetine (PROZAC) 20 MG capsule Take 20 mg by mouth 2 (two) times daily. 06/07/16  Yes [provider]  ibuprofen (ADVIL) 200 MG tablet Take 400 mg by mouth every 6 (six) hours as needed for mild pain.   Yes [provider]  L-Methylfolate-B6-B12 (FOLTX) 1.13-25-2 MG TABS Take 1 tablet by mouth daily. 10/09/20  Yes [provider]  levalbuterol (XOPENEX HFA) 45 MCG/ACT inhaler Inhale 2 puffs into the lungs every 4 (four) hours as needed for wheezing. Use 2 times daily x4 days, then every 4 hours as needed. 11/22/20 11/22/21 Yes Rodolph Bong, MD  losartan (COZAAR) 25 MG tablet Take 25 mg by mouth daily.   Yes [provider]  metoprolol succinate (TOPROL-XL) 25 MG 24 hr tablet TAKE 1 TABLET BY MOUTH EVERY DAY AT BEDTIME Patient taking differently: Take 25 mg by mouth at bedtime. 06/20/20  Yes Pricilla Riffle, MD  mirtazapine (REMERON) 7.5 MG tablet Take 7.5 mg by mouth at bedtime. 08/18/20  Yes [provider]  omeprazole (PRILOSEC) 40 MG capsule Take 40 mg by mouth daily.   Yes [provider]  Potassium 99 MG TABS Take 99 mg by mouth 2 (two) times daily.   Yes [provider]  feeding supplement, ENSURE ENLIVE, (ENSURE ENLIVE) LIQD Take 237 mLs by mouth 2 (two) times daily between meals. Patient not taking: No sig reported 02/09/20   Almon Hercules, MD  ferrous sulfate 325 (65 FE) MG tablet Take 1 tablet (325 mg total) by mouth 2 (two) times daily with a meal. 02/09/20 08/07/20  Almon Hercules, MD  Multiple Vitamin (MULTIVITAMIN WITH MINERALS) TABS tablet Take 1 tablet by mouth daily. Patient not taking: No sig reported 02/09/20   Almon Hercules, MD  QUEtiapine (SEROQUEL) 25 MG tablet Take 1 tablet (25 mg total) by mouth 2 (two) times daily. Patient not taking: No sig reported 11/18/20   Anson Fret, MD  vitamin B-12 1000 MCG tablet Take 1 tablet (1,000 mcg total) by mouth daily. Patient not taking: No sig reported 02/09/20   Almon Hercules, MD    Allergies:   Allergies  Allergen Reactions  . Dobutamine Other (See Comments)    Heart beating hard with CP, neck pain, and weakness  . Epinephrine Other (See Comments)    Fast heart beat  . Aspirin Nausea And Vomiting    History of stomach ulcer  . Clarithromycin Other (See Comments)    Yeast infection  . Doxycycline Other (See Comments)    Makes stomach hurt  . Excedrin Extra Strength [Aspirin-Acetaminophen-Caffeine] Nausea And Vomiting  . Penicillins Other (See Comments)    Yeast infection  . Prednisone Other (See Comments)    Shakes   . Propoxyphene Nausea And Vomiting    Social History:  reports that she quit smoking about 2 weeks ago. Her smoking use included cigarettes. She has a 50.00 pack-year smoking history. She has never used smokeless tobacco. She reports that she does not drink alcohol and does not use drugs.  Family History: Family History  Problem Relation Age of Onset  . Heart attack  Mother   . Transient ischemic attack Mother   . Dementia  Mother        "at the end"  . Heart attack Father 56  . Dementia Brother   . Heart attack Other   . Asthma Other   . Heart failure Other   . Osteoporosis Other   . Heart Problems Brother   . CAD Other      Objective   Physical Exam: Blood pressure (!) 166/77, pulse 76, temperature 99.6 F (37.6 C), temperature source Oral, resp. rate 18, height  (1.6 m), weight 42.3 kg, SpO2 94 %.  Physical Exam Vitals and nursing note reviewed.  Constitutional:      Appearance: Normal appearance.     Comments: Frail elderly female  HENT:     Head: Normocephalic and atraumatic.  Eyes:     Conjunctiva/sclera: Conjunctivae normal.  Cardiovascular:     Rate and Rhythm: Normal rate and regular rhythm.  Pulmonary:     Effort: Pulmonary effort is normal.     Breath sounds: Normal breath sounds.  Abdominal:     General: Abdomen is flat.     Palpations: Abdomen is soft.  Musculoskeletal:     Comments: Left lower extremity slightly externally rotated with pain to hip  Skin:    Coloration: Skin is not jaundiced or pale.  Neurological:     Mental Status: She is alert. Mental status is at baseline.  Psychiatric:        Mood and Affect: Mood normal.        Behavior: Behavior normal.     LABS on Admission: I have personally reviewed all the labs and imaging below    Basic Metabolic Panel: Recent Labs  Lab 11/27/20 1313 11/28/20 0826  NA 135 138  K 3.5 3.5  CL 98 101  CO2 23 25  GLUCOSE 132* 107*  BUN 18 20  CREATININE 0.98 0.87  CALCIUM 9.7 9.6  MG  --  1.5*   Liver Function Tests: No results for input(s): AST, ALT, ALKPHOS, BILITOT, PROT, ALBUMIN in the last 168 hours. No results for input(s): LIPASE, AMYLASE in the last 168 hours. No results for input(s): AMMONIA in the last 168 hours. CBC: Recent Labs  Lab 11/22/20 0509 11/27/20 1313 11/28/20 0826  WBC 9.2 17.0* 18.6*  NEUTROABS 6.4  --   --   HGB 11.0*  12.4 11.6*  HCT 33.6* 37.3 35.0*  MCV 91.6 89.7 90.7  PLT 492* 617* 528*   Cardiac Enzymes: No results for input(s): CKTOTAL, CKMB, CKMBINDEX, TROPONINI in the last 168 hours. BNP: Invalid input(s): POCBNP CBG: No results for input(s): GLUCAP in the last 168 hours.  Radiological Exams on Admission:  DG Knee 2 Views Left  Result Date: 11/27/2020 CLINICAL DATA:  Pain following fall EXAM: LEFT KNEE - 1-2 VIEW COMPARISON:  None. FINDINGS: Frontal and lateral views obtained. No fracture or dislocation. No joint effusion. Joint spaces appear normal. No erosive change. IMPRESSION: No fracture, dislocation, or joint effusion. No evident arthropathy. Electronically Signed   By: Bretta Bang III M.D.   On: 11/27/2020 14:20   CT Head Wo Contrast  Result Date: 11/27/2020 CLINICAL DATA:  Fall on 11/23/2020. Diffuse left-sided pain. History of dementia. EXAM: CT HEAD WITHOUT CONTRAST CT CERVICAL SPINE WITHOUT CONTRAST TECHNIQUE: Multidetector CT imaging of the head and cervical spine was performed following the standard protocol without intravenous contrast. Multiplanar CT image reconstructions of the cervical spine were also generated. COMPARISON:  Head CT 09/17/2020. Head and neck CTA 09/17/2020. Head MRI 09/18/2020. FINDINGS: CT HEAD FINDINGS  Brain: There is no evidence of an acute infarct, intracranial hemorrhage, mass, midline shift, or extra-axial fluid collection. Hypodensities in the cerebral white matter bilaterally are unchanged and nonspecific but compatible with chronic small vessel ischemic disease which is mild for age. Chronic lacunar infarcts in the right basal ganglia are unchanged. There is mild cerebral atrophy. Vascular: Calcified atherosclerosis at the skull base. Right MCA flow diverter device. Skull: No fracture or suspicious osseous lesion. Sinuses/Orbits: Small volume bubbly fluid in the left maxillary sinus. Persistent small left mastoid effusion. Right cataract extraction. Other:  None. CT CERVICAL SPINE FINDINGS Alignment: Chronic mild reversal of the normal cervical lordosis. Unchanged trace retrolisthesis of C2 on C3 and C5 on C6 and grade 1 anterolisthesis of C4 on C5 and C7 on T1. Skull base and vertebrae: No acute fracture or suspicious osseous lesion. Moderate median C1-2 arthropathy. Soft tissues and spinal canal: No prevertebral fluid or swelling. No visible canal hematoma. Disc levels: Cervical disc degeneration most advanced at C5-6 where there is severe disc space narrowing, degenerative endplate sclerosis and spurring, and vacuum disc. Mild disc degeneration at other levels. Severe facet arthrosis on the right at C3-4, on the left at C4-5, and on the left at C7-T1. Mild-to-moderate multilevel neural foraminal stenosis. Upper chest: Clear lung apices. Other: None. IMPRESSION: 1. No evidence of acute intracranial abnormality. 2. Mild chronic small vessel ischemic disease. 3. No acute cervical spine fracture. Electronically Signed   By: Sebastian Ache M.D.   On: 11/27/2020 14:25   CT Cervical Spine Wo Contrast  Result Date: 11/27/2020 CLINICAL DATA:  Fall on 11/23/2020. Diffuse left-sided pain. History of dementia. EXAM: CT HEAD WITHOUT CONTRAST CT CERVICAL SPINE WITHOUT CONTRAST TECHNIQUE: Multidetector CT imaging of the head and cervical spine was performed following the standard protocol without intravenous contrast. Multiplanar CT image reconstructions of the cervical spine were also generated. COMPARISON:  Head CT 09/17/2020. Head and neck CTA 09/17/2020. Head MRI 09/18/2020. FINDINGS: CT HEAD FINDINGS Brain: There is no evidence of an acute infarct, intracranial hemorrhage, mass, midline shift, or extra-axial fluid collection. Hypodensities in the cerebral white matter bilaterally are unchanged and nonspecific but compatible with chronic small vessel ischemic disease which is mild for age. Chronic lacunar infarcts in the right basal ganglia are unchanged. There is mild  cerebral atrophy. Vascular: Calcified atherosclerosis at the skull base. Right MCA flow diverter device. Skull: No fracture or suspicious osseous lesion. Sinuses/Orbits: Small volume bubbly fluid in the left maxillary sinus. Persistent small left mastoid effusion. Right cataract extraction. Other: None. CT CERVICAL SPINE FINDINGS Alignment: Chronic mild reversal of the normal cervical lordosis. Unchanged trace retrolisthesis of C2 on C3 and C5 on C6 and grade 1 anterolisthesis of C4 on C5 and C7 on T1. Skull base and vertebrae: No acute fracture or suspicious osseous lesion. Moderate median C1-2 arthropathy. Soft tissues and spinal canal: No prevertebral fluid or swelling. No visible canal hematoma. Disc levels: Cervical disc degeneration most advanced at C5-6 where there is severe disc space narrowing, degenerative endplate sclerosis and spurring, and vacuum disc. Mild disc degeneration at other levels. Severe facet arthrosis on the right at C3-4, on the left at C4-5, and on the left at C7-T1. Mild-to-moderate multilevel neural foraminal stenosis. Upper chest: Clear lung apices. Other: None. IMPRESSION: 1. No evidence of acute intracranial abnormality. 2. Mild chronic small vessel ischemic disease. 3. No acute cervical spine fracture. Electronically Signed   By: Sebastian Ache M.D.   On: 11/27/2020 14:25   CT Hip  Left Wo Contrast  Result Date: 11/27/2020 CLINICAL DATA:  Left hip pain after falling today. Abnormal radiographs with questionable left femoral head fracture. EXAM: CT OF THE LEFT HIP WITHOUT CONTRAST TECHNIQUE: Multidetector CT imaging of the left hip was performed according to the standard protocol. Multiplanar CT image reconstructions were also generated. COMPARISON:  Radiographs 11/27/2020 and 09/17/2020. Pelvic CT 09/17/2020. FINDINGS: Bones/Joint/Cartilage There is a new mildly impacted subcapital fracture of the left femoral neck, best seen on the reformatted images. This demonstrates up to 6 mm  of inferior medial displacement on coronal image 33/3. There are underlying degenerative changes of the left hip with joint space narrowing and femoral head osteophytes. No dislocation or fracture of the left hemipelvis. There is a small left hip joint effusion. Ligaments Suboptimally assessed by CT. Muscles and Tendons Unremarkable. Soft tissues Mild periarticular soft tissue swelling without focal hematoma or foreign body. Iliofemoral atherosclerosis and diffuse sigmoid diverticulosis are noted. IMPRESSION: 1. Acute mildly impacted and displaced fracture of the left femoral neck. 2. Underlying moderate left hip osteoarthritis. 3. No periarticular hematoma. Electronically Signed   By: Carey Bullocks M.D.   On: 11/27/2020 15:21   DG Chest Portable 1 View  Result Date: 11/27/2020 CLINICAL DATA:  Left hip fracture. EXAM: PORTABLE CHEST 1 VIEW COMPARISON:  November 18, 2020. FINDINGS: The heart size and mediastinal contours are within normal limits. No pneumothorax or pleural effusion is noted. Mild bibasilar subsegmental atelectasis is noted. The visualized skeletal structures are unremarkable. IMPRESSION: Mild bibasilar subsegmental atelectasis. Electronically Signed   By: Lupita Raider M.D.   On: 11/27/2020 16:45   DG Hip Unilat W or Wo Pelvis 2-3 Views Left  Result Date: 11/27/2020 CLINICAL DATA:  Pain following fall EXAM: DG HIP (WITH OR WITHOUT PELVIS) 2-3V LEFT COMPARISON:  None. FINDINGS: Frontal pelvis as well as frontal and lateral views of the left hip joint were obtained. There is a subtle lucency in the medial aspect of the left femoral head, concerning for potential incomplete fracture in this area. No other findings suggesting potential fracture. No dislocation. There is mild symmetric narrowing of each hip joint. No erosive change. IMPRESSION: Questionable incomplete fracture in the medial aspect of the left femoral head. This appearance warrants further assessment with CT or MR of the left hip to  further evaluate. MR would be the optimum study of choice to further evaluate, given ability to demonstrate marrow edema with MR. No other findings suggesting potential fracture. No dislocation. Mild symmetric narrowing each hip joint. Electronically Signed   By: Bretta Bang III M.D.   On: 11/27/2020 14:19      EKG: normal sinus rhythm, Q waves in anterior leads, prolonged QT interval   A & P   Principal Problem:   Hip fracture (HCC) Active Problems:   Paroxysmal SVT (supraventricular tachycardia) (HCC)   History of CVA (cerebrovascular accident)   CKD (chronic kidney disease), stage III (HCC)   Hypertension   Prolonged QT interval   1. Acute mildly impacted and displaced left femoral neck fracture s/p mechanical fall a. Plan for surgery this afternoon b. N.p.o. c. SCDs d. PT eval when able and TOC consult e. Pain management per hip fracture order set  2. Prolonged QT a. Goal K > 4.0 (replete IV while n.p.o.), goal Mg > 2.0 b. Hold QT prolonging agents including home flecainide, Remeron for now until QT resolves c. Continue home beta-blocker d. Telemetry  3. Paroxysmal SVT a. Holding flecainide as above b. Continue  home beta-blocker  4. CKD 3a, at baseline  5. Hypertension a. Continue home amlodipine and Toprol-XL and holding losartan  6. History of CVA, at baseline a. Hold Plavix for today and restart when okay by Ortho  7. Hypomagnesemia a. Replete IV  8. Dementia a. Appears at baseline  9. Thrombocytosis, likely reactive  10. Leukocytosis, likely reactive    DVT prophylaxis: SCDs,   Code Status: DNR  Diet: N.p.o. Family Communication: Admission, patients condition and plan of care including tests being ordered have been discussed with the patient who indicates understanding and agrees with the plan and Code Status. Patient's daughter was updated  Disposition Plan: The appropriate patient status for this patient is INPATIENT. Inpatient status is  judged to be reasonable and necessary in order to provide the required intensity of service to ensure the patient's safety. The patient's presenting symptoms, physical exam findings, and initial radiographic and laboratory data in the context of their chronic comorbidities is felt to place them at high risk for further clinical deterioration. Furthermore, it is not anticipated that the patient will be medically stable for discharge from the hospital within 2 midnights of admission. The following factors support the patient status of inpatient.   " The patient's presenting symptoms include fall. " The worrisome physical exam findings include left hip pain. " The initial radiographic and laboratory data are worrisome because of left hip fracture. " The chronic co-morbidities include dementia, SVT.   * I certify that at the point of admission it is my clinical judgment that the patient will require inpatient hospital care spanning beyond 2 midnights from the point of admission due to high intensity of service, high risk for further deterioration and high frequency of surveillance required.*   Status is: Inpatient  Remains inpatient appropriate because:IV treatments appropriate due to intensity of illness or inability to take PO and Inpatient level of care appropriate due to severity of illness   Dispo: The patient is from: Home              Anticipated d/c is to: SNF              Patient currently is not medically stable to d/c.   Difficult to place patient No      Consultants  . Orthopedic surgery  Procedures  . None  Time Spent on Admission: 61 minutes    Jae Dire, DO [facility]  11/28/2020, 10:21 AM

## 2020-11-28 NOTE — Progress Notes (Signed)
Initial Nutrition Assessment  DOCUMENTATION CODES:   Severe malnutrition in context of chronic illness,Underweight  INTERVENTION:  - diet advancement as medically feasible. - will order nutrition-related interventions with diet advancement.    NUTRITION DIAGNOSIS:   Severe Malnutrition related to chronic illness (dementia) as evidenced by severe fat depletion,severe muscle depletion.   GOAL:   Patient will meet greater than or equal to 90% of their needs  MONITOR:   Diet advancement,Labs,Weight trends  REASON FOR ASSESSMENT:   Malnutrition Screening Tool,Consult Hip fracture protocol  ASSESSMENT:   80 year old female with medical history of dementia, anxiety, depression, CVA, stage 3 CKD, cerebral aneurysm, COPD, GERD, orthostatic hypotension, mitral valve prolapse, and hospitalization from 3/28-4/1 for CAP. She presented to the ED due to knee pain subsequent to a fall 4/2. Xray in the ED showed L hip fx.  She has been NPO since admission. Patient was sleeping during RD visit although did intermittently open eyes, but was unable to provide information.   Her daughter and grandson were at bedside. Of note, both of them are slender. Patient likely genetically predispositioned to be slender, but degree of depletions do indicate malnutrition.  Patient lives with her daughter. Patient was seen by another RD on 3/30 at which time it was learned that patient does no eat meat, she does not like ONS other than Magic Cup, she enjoys chocolate milk with all meals, and she was interested in receiving snacks between meals.  In the 1 week since d/c, patient has been eating very little. Even when favorite meals were prepared, she would eat bites only of the meal. Her daughter was often needing to feed her.   Weight yesterday was 93 lb which is consistent with weight on 10/01/20. Prior to that, weight on 09/18/20 was 98 lb indicating 5 lb weight loss (5.1% body weight) in the past 2.5 months;  not significant for time frame.  Patient to go to the OR this afternoon for fixation of L hip fx.    Labs reviewed; Mg: 1.5 mg/dl.  Medications reviewed; 20 mg oral pepcid/day, 4 g IV Mg sulfate x1 run 4/7, 10 mEq IV KCl x2 runs 4/7. IVF; LR @ 75 ml/hr.     NUTRITION - FOCUSED PHYSICAL EXAM:  Flowsheet Row Most Recent Value  Orbital Region Moderate depletion  Upper Arm Region Severe depletion  Thoracic and Lumbar Region Severe depletion  Buccal Region Moderate depletion  Temple Region Severe depletion  Clavicle Bone Region Severe depletion  Clavicle and Acromion Bone Region Severe depletion  Scapular Bone Region Unable to assess  Dorsal Hand Severe depletion  Patellar Region Severe depletion  Anterior Thigh Region Unable to assess  Posterior Calf Region Severe depletion  Edema (RD Assessment) None  Hair Reviewed  Eyes Unable to assess  Mouth Unable to assess  Skin Reviewed  Nails Reviewed       Diet Order:   Diet Order            Diet NPO time specified  Diet effective now                 EDUCATION NEEDS:   No education needs have been identified at this time  Skin:  Skin Assessment: Reviewed RN Assessment  Last BM:  PTA/unknown  Height:   Ht Readings from Last 1 Encounters:  11/27/20 5\' 3"  (1.6 m)    Weight:   Wt Readings from Last 1 Encounters:  11/27/20 42.3 kg    Estimated Nutritional Needs:  Kcal:  1600-1800 kcal Protein:  70-85 grams Fluid:  >/= 1.7 L/day     Barbara Gammon, MS, RD, LDN, CNSC Inpatient Clinical Dietitian RD pager # available in [facility]  After hours/weekend pager # available in [facility]

## 2020-11-28 NOTE — Anesthesia Postprocedure Evaluation (Signed)
Anesthesia Post Note  Patient: Barbara Hill  Procedure(s) Performed: CANNULATED HIP PINNING (Left Hip)     Patient location during evaluation: PACU Anesthesia Type: General Level of consciousness: awake and alert Pain management: pain level controlled Vital Signs Assessment: post-procedure vital signs reviewed and stable Respiratory status: spontaneous breathing, nonlabored ventilation, respiratory function stable and patient connected to nasal cannula oxygen Cardiovascular status: blood pressure returned to baseline and stable Postop Assessment: no apparent nausea or vomiting Anesthetic complications: no   No complications documented.  Last Vitals:  Vitals:   11/28/20 1745 11/28/20 1806  BP: (!) 169/66 (!) 154/63  Pulse: 66 66  Resp: 15 16  Temp: 36.7 C 36.7 C  SpO2: 94% 91%    Last Pain:  Vitals:   11/28/20 1806  TempSrc: Oral  PainSc: 0-No pain                 Rand Boller S

## 2020-11-28 NOTE — Progress Notes (Signed)
   11/28/20 1806  Vitals  Temp 98.1 F (36.7 C)  Temp Source Oral  BP (!) 154/63  MAP (mmHg) 89  BP Location Right Arm  BP Method Automatic  Patient Position (if appropriate) Lying  Pulse Rate 66  Pulse Rate Source Monitor  Resp 16  Level of Consciousness  Level of Consciousness Responds to Voice  MEWS COLOR  MEWS Score Color Green  Oxygen Therapy  SpO2 91 %  O2 Device Nasal Cannula  O2 Flow Rate (L/min) 2 L/min  Pain Assessment  Pain Scale 0-10  Pain Score 0  Arrived from surgery. Patient alert to self, and family. Still lethargic but arouses to stimuli when tapped. Vital signs stable. RR even and unlabored. Foley clean dry, and intact. Pulses +2 upper and lower and intact. Surgical to left hip covered with small melpilex clean, dry and intact, family at bedside. Ancef delayed and 0.45 % NaCl with KCl 20 mEq / L infusion not being loaded/available by pharmacy. Daughter also endorses that mother has acetaminophen allergy. Will pass to oncoming night nurse for clarity and hold at this time. Call bell within reach, no other needs identified. Will continue to monitor.

## 2020-11-28 NOTE — Op Note (Addendum)
11/27/2020 - 11/28/2020  4:33 PM  PATIENT:  Barbara Hill    PRE-OPERATIVE DIAGNOSIS: Left valgus impacted femoral neck fracture  POST-OPERATIVE DIAGNOSIS:  Same  She had a combination of pathological and traumatic fracture of proximal femur due to osteoporosis and fall.Combination of pathological and traumatic fracture of proximal femur due to osteoporosis and fall  PROCEDURE: LEFT CANNULATED HIP PINNING  SURGEON:  Eulas Post, MD  PHYSICIAN ASSISTANT: Janine Ores, PA-C,  present and scrubbed throughout the case, critical for completion in a timely fashion, and for retraction, instrumentation, and closure.  ANESTHESIA:   General  ESTIMATED BLOOD LOSS: 25 mL  PREOPERATIVE INDICATIONS:  [name] is a  80 y.o. female who fell and was found to have a diagnosis of LEFT HIP FRACTURE who elected for surgical management.    The risks benefits and alternatives were discussed with the patient preoperatively including but not limited to the risks of infection, bleeding, nerve injury, cardiopulmonary complications, blood clots, malunion, nonunion, avascular necrosis, the need for revision surgery, the potential for conversion to hemiarthroplasty, among others, and the patient was willing to proceed.  OPERATIVE IMPLANTS: 6.5 mm cannulated screws x3, and I used a washer for the anterior superior screw because it measured a length 83, but I wanted an 85 length screw.  OPERATIVE FINDINGS: Clinical osteoporosis with weak bone, proximal femur  Unique aspects of the case: We debated the use of TXA, but ultimately elected not to use TXA because of a history of stroke, additionally the low risk of bleeding with the procedure.  I used a washer on the anterior superior screw, because I wanted the length, although in trying to get the washer to fully seat I had concern that the washer was engaging on the posterior screw, and carried risk for splitting the proximal bone, so I did not fully seat  this.  OPERATIVE PROCEDURE: The patient was brought to the operating room and placed in supine position. IV antibiotics were given. General anesthesia administered. Foley was also given. The patient was placed on the fracture table. The operative extremity was positioned, without any significant reduction maneuver and was prepped and draped in usual sterile fashion.  Time out was performed.  Small incision was made distal to the greater trochanter, and 3 guidewires were introduced Into an inverted triangle configuration. The lengths were measured. The reduction was slightly valgus, and near-anatomic. I opened the cortex with a cannulated drill, and then placed the screws into position. Satisfactory fixation was achieved.  The wounds were irrigated copiously, and repaired with Vicryl with Steri-Strips and sterile gauze. There no complications and the patient tolerated the procedure well.  The patient will be weightbearing as tolerated, and will be on Lovenox for a period of 4 weeks after discharge for DVT prophylaxis.

## 2020-11-28 NOTE — Anesthesia Procedure Notes (Signed)
Procedure Name: Intubation Date/Time: 11/28/2020 3:34 PM Performed by: Wilder Glade, CRNA Pre-anesthesia Checklist: Patient identified, Suction available, Emergency Drugs available, Patient being monitored and Timeout performed Patient Re-evaluated:Patient Re-evaluated prior to induction Oxygen Delivery Method: Circle system utilized Preoxygenation: Pre-oxygenation with 100% oxygen Induction Type: IV induction Ventilation: Mask ventilation without difficulty Laryngoscope Size: Miller and 2 Grade View: Grade I Tube type: Oral Tube size: 7.0 mm Number of attempts: 1 Airway Equipment and Method: Stylet Placement Confirmation: ETT inserted through vocal cords under direct vision,  positive ETCO2 and breath sounds checked- equal and bilateral Secured at: 21 cm Tube secured with: Tape Dental Injury: Teeth and Oropharynx as per pre-operative assessment

## 2020-11-28 NOTE — Progress Notes (Signed)
Patient to surgery. Alert and oriented to self, family,place but not time. Will re-assume care when patient returns.

## 2020-11-28 NOTE — Transfer of Care (Signed)
Immediate Anesthesia Transfer of Care Note  Patient: Barbara Hill  Procedure(s) Performed: CANNULATED HIP PINNING (Left Hip)  Patient Location: PACU  Anesthesia Type:General  Level of Consciousness: drowsy  Airway & Oxygen Therapy: Patient Spontanous Breathing and Patient connected to face mask oxygen  Post-op Assessment: Report given to RN and Post -op Vital signs reviewed and stable  Post vital signs: Reviewed and stable  Last Vitals:  Vitals Value Taken Time  BP 168/77 11/28/20 1700  Temp 37.2 C 11/28/20 1700  Pulse 73 11/28/20 1707  Resp 19 11/28/20 1707  SpO2 100 % 11/28/20 1707  Vitals shown include unvalidated device data.  Last Pain:  Vitals:   11/28/20 1319  TempSrc: Oral  PainSc:          Complications: No complications documented.

## 2020-11-28 NOTE — Interval H&P Note (Signed)
History and Physical Interval Note:  11/28/2020 3:14 PM  Barbara Hill  has presented today for surgery, with the diagnosis of LEFT HIP FRACTURE.  The various methods of treatment have been discussed with the patient and family. After consideration of risks, benefits and other options for treatment, the patient has consented to  Procedure(s): CANNULATED HIP PINNING (Left) as a surgical intervention.  The patient's history has been reviewed, patient examined, no change in status, stable for surgery.  I have reviewed the patient's chart and labs.  Questions were answered to the patient's satisfaction.    The risks benefits and alternatives were discussed with the patient including but not limited to the risks of nonoperative treatment, versus surgical intervention including infection, bleeding, nerve injury, malunion, nonunion, the need for revision surgery, hardware prominence, hardware failure, the need for hardware removal, blood clots, cardiopulmonary complications, morbidity, mortality, among others, and they were willing to proceed.     Barbara Hill

## 2020-11-28 NOTE — Anesthesia Preprocedure Evaluation (Signed)
Anesthesia Evaluation  Patient identified by MRN, date of birth, ID band Patient awake    Reviewed: Allergy & Precautions, H&P , NPO status , Patient's Chart, lab work & pertinent test results  Airway Mallampati: II   Neck ROM: full    Dental   Pulmonary COPD, Patient abstained from smoking., former smoker,    breath sounds clear to auscultation       Cardiovascular hypertension, + Peripheral Vascular Disease  + dysrhythmias Supra Ventricular Tachycardia  Rhythm:regular Rate:Normal  EF 55-60%   Neuro/Psych  Headaches, PSYCHIATRIC DISORDERS Anxiety  Neuromuscular disease CVA    GI/Hepatic   Endo/Other    Renal/GU      Musculoskeletal   Abdominal   Peds  Hematology   Anesthesia Other Findings   Reproductive/Obstetrics                             Anesthesia Physical Anesthesia Plan  ASA: III  Anesthesia Plan: General   Post-op Pain Management:    Induction: Intravenous  PONV Risk Score and Plan: 3 and Ondansetron, Dexamethasone and Treatment may vary due to age or medical condition  Airway Management Planned: Oral ETT  Additional Equipment:   Intra-op Plan:   Post-operative Plan: Extubation in OR  Informed Consent: I have reviewed the patients History and Physical, chart, labs and discussed the procedure including the risks, benefits and alternatives for the proposed anesthesia with the patient or authorized representative who has indicated his/her understanding and acceptance.     Dental advisory given  Plan Discussed with: CRNA, Anesthesiologist and Surgeon  Anesthesia Plan Comments:         Anesthesia Quick Evaluation

## 2020-11-29 ENCOUNTER — Inpatient Hospital Stay (HOSPITAL_COMMUNITY): Payer: Medicare Other

## 2020-11-29 ENCOUNTER — Encounter (HOSPITAL_COMMUNITY): Payer: Self-pay | Admitting: Orthopedic Surgery

## 2020-11-29 DIAGNOSIS — I639 Cerebral infarction, unspecified: Secondary | ICD-10-CM

## 2020-11-29 DIAGNOSIS — S72002A Fracture of unspecified part of neck of left femur, initial encounter for closed fracture: Secondary | ICD-10-CM | POA: Diagnosis not present

## 2020-11-29 LAB — CBC
HCT: 32.1 % — ABNORMAL LOW (ref 36.0–46.0)
Hemoglobin: 10.2 g/dL — ABNORMAL LOW (ref 12.0–15.0)
MCH: 30 pg (ref 26.0–34.0)
MCHC: 31.8 g/dL (ref 30.0–36.0)
MCV: 94.4 fL (ref 80.0–100.0)
Platelets: 459 10*3/uL — ABNORMAL HIGH (ref 150–400)
RBC: 3.4 MIL/uL — ABNORMAL LOW (ref 3.87–5.11)
RDW: 14 % (ref 11.5–15.5)
WBC: 18 10*3/uL — ABNORMAL HIGH (ref 4.0–10.5)
nRBC: 0 % (ref 0.0–0.2)

## 2020-11-29 LAB — GLUCOSE, CAPILLARY: Glucose-Capillary: 100 mg/dL — ABNORMAL HIGH (ref 70–99)

## 2020-11-29 LAB — BASIC METABOLIC PANEL
Anion gap: 11 (ref 5–15)
BUN: 20 mg/dL (ref 8–23)
CO2: 24 mmol/L (ref 22–32)
Calcium: 9.3 mg/dL (ref 8.9–10.3)
Chloride: 99 mmol/L (ref 98–111)
Creatinine, Ser: 0.85 mg/dL (ref 0.44–1.00)
GFR, Estimated: >60 mL/min (ref >60)
Glucose, Bld: 98 mg/dL (ref 70–99)
Potassium: 4.2 mmol/L (ref 3.5–5.1)
Sodium: 134 mmol/L — ABNORMAL LOW (ref 135–145)

## 2020-11-29 MED ORDER — IOHEXOL 350 MG/ML SOLN
75.0000 mL | Freq: Once | INTRAVENOUS | Status: AC | PRN
  Administered 2020-11-29: 60 mL via INTRAVENOUS

## 2020-11-29 MED ORDER — STROKE: EARLY STAGES OF RECOVERY BOOK
Freq: Once | Status: DC
  Filled 2020-11-29: qty 1

## 2020-11-29 MED ORDER — OXYCODONE HCL 5 MG PO TABS
2.5000 mg | ORAL_TABLET | ORAL | Status: DC | PRN
  Filled 2020-11-29: qty 1

## 2020-11-29 MED ORDER — SODIUM CHLORIDE 0.9 % IV SOLN
1.5000 g | Freq: Four times a day (QID) | INTRAVENOUS | Status: AC
  Administered 2020-11-30 – 2020-12-03 (×16): 1.5 g via INTRAVENOUS
  Filled 2020-11-29 (×5): qty 4
  Filled 2020-11-29: qty 1.5
  Filled 2020-11-29 (×4): qty 4
  Filled 2020-11-29: qty 1.5
  Filled 2020-11-29 (×4): qty 4
  Filled 2020-11-29: qty 1.5
  Filled 2020-11-29 (×3): qty 4

## 2020-11-29 MED ORDER — PANTOPRAZOLE SODIUM 40 MG IV SOLR
40.0000 mg | INTRAVENOUS | Status: DC
  Administered 2020-11-30: 40 mg via INTRAVENOUS
  Filled 2020-11-29 (×2): qty 40

## 2020-11-29 MED ORDER — ASPIRIN 300 MG RE SUPP
300.0000 mg | Freq: Every day | RECTAL | Status: DC
  Administered 2020-11-29: 300 mg via RECTAL
  Filled 2020-11-29 (×2): qty 1

## 2020-11-29 MED ORDER — ASPIRIN EC 81 MG PO TBEC: 81.0000 mg | DELAYED_RELEASE_TABLET | Freq: Every day | ORAL | Status: DC

## 2020-11-29 MED ORDER — DEXTROSE-NACL 5-0.9 % IV SOLN: INTRAVENOUS | Status: AC

## 2020-11-29 MED ORDER — ASPIRIN 81 MG PO CHEW
81.0000 mg | CHEWABLE_TABLET | Freq: Every day | ORAL | Status: DC
  Administered 2020-11-30 – 2020-12-04 (×5): 81 mg via ORAL
  Filled 2020-11-29 (×5): qty 1

## 2020-11-29 MED ORDER — IOHEXOL 350 MG/ML SOLN
100.0000 mL | Freq: Once | INTRAVENOUS | Status: AC | PRN
  Administered 2020-11-29: 40 mL via INTRAVENOUS

## 2020-11-29 MED ORDER — CLOPIDOGREL BISULFATE 75 MG PO TABS
75.0000 mg | ORAL_TABLET | Freq: Every day | ORAL | Status: DC
  Administered 2020-11-30 – 2020-12-04 (×5): 75 mg via ORAL
  Filled 2020-11-29 (×5): qty 1

## 2020-11-29 MED ORDER — LABETALOL HCL 5 MG/ML IV SOLN
10.0000 mg | INTRAVENOUS | Status: DC | PRN
  Administered 2020-11-30 – 2020-12-03 (×2): 10 mg via INTRAVENOUS
  Filled 2020-11-29 (×2): qty 4

## 2020-11-29 NOTE — Evaluation (Signed)
Physical Therapy Evaluation Patient Details Name: Barbara Hill MRN: [id number] DOB: [birth year] Today's Date: 11/29/2020   History of Present Illness  Pt is 80 y.o. female presents after fall 4/2, s/p L cannulated hip pinning 4/7.  PMH: dementia, anxiety, depression, brain aneurysm, CVA, CKD 3, COPD, GERD, paroxysmal SVT, normocytic anemia, orthostatic hypotension, mitral valve prolapse.  Clinical Impression  Pt admitted with above diagnosis. Pt recently d/c from hospital, per chart review was independent and without falls, but recently fell and now with L cannulated hip pinning. Pt confused upon arrival, states her family is "upset" with her, becomes tearful, and asks therapist and RN if we are related to her family; pt oriented and soothed with good carryover. Pt requires mod A +2 to slide LLE over to EOB and upright trunk, only min A to power to standing with BUE assisting and BLE braced against bed. Pt able to clear each foot from floor, but heavy posterior lean and dizzy/nausea complaints so returned to sitting. Pt requires max A +2 for safety to pivot over to bedside chair. RN in room during eval and at end, BP WNL, pt in chair with BLE elevated. Pt will require 24 hr assist, no family present and unsure if family is equipped to bring pt home, possible SNF for strengthening prior to return home; will continue to assess. Pt currently with functional limitations due to the deficits listed below (see PT Problem List). Pt will benefit from skilled PT to increase their independence and safety with mobility to allow discharge to the venue listed below.       Follow Up Recommendations Supervision/Assistance - 24 hour (SNF vs HHPT)    Equipment Recommendations  None recommended by PT    Recommendations for Other Services       Precautions / Restrictions Precautions Precautions: Fall Restrictions Weight Bearing Restrictions: No      Mobility  Bed Mobility Overal bed mobility: Needs  Assistance Bed Mobility: Supine to Sit  Supine to sit: Mod assist;+2 for physical assistance  General bed mobility comments: pt initiates LLE abduction over to EOB, limited and painful requiring mod A to clear LLE to floor and to upright trunk into sitting, increased time    Transfers Overall transfer level: Needs assistance Equipment used: Rolling walker (2 wheeled);1 person hand held assist Transfers: Sit to/from [employer] Sit to Stand: Min assist Stand pivot transfers: Max assist;+2 safety/equipment  General transfer comment: cues for hand placement, pt able to power up to stand wit min A and BLE braced against bed, unable to pivot with RW so returned to sitting then max A +2 to pivot into chair  Ambulation/Gait  General Gait Details: pt able to clear each foot and progress forward ~2 inches, but heavy posterior lean and increased dizziness so returned to sitting - BP 123/40mmHg and RN in room  Stairs            Wheelchair Mobility    Modified Rankin (Stroke Patients Only)       Balance   Sitting-balance support: Feet supported Sitting balance-Leahy Scale: Fair Sitting balance - Comments: seated EOB   Standing balance support: During functional activity;Bilateral upper extremity supported Standing balance-Leahy Scale: Poor Standing balance comment: reliant on UE support and therapist assisting        Pertinent Vitals/Pain Pain Assessment: Faces Faces Pain Scale: Hurts little more Pain Location: L LE with movement Pain Descriptors / Indicators: Grimacing Pain Intervention(s): Limited activity within patient's tolerance;Monitored during session;Repositioned  Home Living Family/patient expects to be discharged to:: Unsure (pt oriented to self only, no family present)         Prior Function Level of Independence: Independent (obtained from recent admission)  Comments: walks without AD, sometimes gets "overbalanced" but denies falls in past 1  year, daughter assists with shower transfers as needed. reports independence with ADLs - some assist for shower if she needs it (obtained from recent admission)     Hand Dominance   Dominant Hand: Right    Extremity/Trunk Assessment   Upper Extremity Assessment Upper Extremity Assessment: Overall WFL for tasks assessed    Lower Extremity Assessment Lower Extremity Assessment: RLE deficits/detail;LLE deficits/detail RLE Deficits / Details: AROM WNL, strength grossly 3+/5 LLE Deficits / Details: ankle strength and AROM WNL, 2/5 hip and knee LLE: Unable to fully assess due to pain    Cervical / Trunk Assessment Cervical / Trunk Assessment: Kyphotic  Communication   Communication: No difficulties  Cognition Arousal/Alertness: Awake/alert Behavior During Therapy: WFL for tasks assessed/performed Overall Cognitive Status: History of cognitive impairments - at baseline  General Comments: Pt is oriented to self and family, tearful stating her family is mad at her. Pt requires verbal cues and increased time with single step commands.  Pt reoriented to hospital, recent fall and surgery and easy to soothe.      General Comments General comments (skin integrity, edema, etc.): pt on 2L and SpO2 >90% throughout, BP 123/67 with dizziness and nausea complaints- RN in room    Exercises     Assessment/Plan    PT Assessment Patient needs continued PT services  PT Problem List Decreased strength;Decreased activity tolerance;Decreased balance;Decreased mobility;Decreased cognition;Decreased knowledge of use of DME;Pain       PT Treatment Interventions DME instruction;Gait training;Functional mobility training;Therapeutic activities;Therapeutic exercise;Balance training;Patient/family education    PT Goals (Current goals can be found in the Care Plan section)  Acute Rehab PT Goals PT Goal Formulation: Patient unable to participate in goal setting Time For Goal Achievement:  12/13/20 Potential to Achieve Goals: Good    Frequency Min 3X/week   Barriers to discharge        Co-evaluation               AM-PAC PT "6 Clicks" Mobility  Outcome Measure Help needed turning from your back to your side while in a flat bed without using bedrails?: Total Help needed moving from lying on your back to sitting on the side of a flat bed without using bedrails?: Total Help needed moving to and from a bed to a chair (including a wheelchair)?: Total Help needed standing up from a chair using your arms (e.g., wheelchair or bedside chair)?: Total Help needed to walk in hospital room?: Total Help needed climbing 3-5 steps with a railing? : Total 6 Click Score: 6    End of Session Equipment Utilized During Treatment: Gait belt Activity Tolerance: Patient limited by pain;Other (comment) (limited by dizziness and nausea) Patient left: in chair;with call bell/phone within reach;with nursing/sitter in room Nurse Communication: Mobility status;Other (comment) (dizziness/nausea - RN in room) PT Visit Diagnosis: Unsteadiness on feet (R26.81);Other abnormalities of gait and mobility (R26.89);Muscle weakness (generalized) (M62.81);Pain Pain - Right/Left: Left Pain - part of body: Hip    Time: 3500-9381 PT Time Calculation (min) (ACUTE ONLY): 20 min   Charges:   PT Evaluation $PT Eval Moderate Complexity: 1 Mod           Tori Carlyle Mcelrath PT, DPT 11/29/20, 12:17 PM

## 2020-11-29 NOTE — Care Management Important Message (Signed)
Medicare IM printed for Social Work staff to give to the patient 

## 2020-11-29 NOTE — Progress Notes (Signed)
Appears to be more confused today. Alert to self and family. Made MD Charlyn Minerva aware. Called daughter [name] on the phone to help with reorientation. Easily reoriented. Patient endorses feeling anxious and tearful. Given PRN Xanex per MAR. Patient up in the chair., with call bell within reach, assisted with getting dentures in mouth. No other needs identified. Will continue to monitor.

## 2020-11-29 NOTE — Progress Notes (Signed)
First attempt at ng tube by this nurse unsuccessful by this nurse. Tube coiled in back of patient throat. Second attempt by charge RN tubing was not in place on KUB. MD Lowell Guitar made aware via secure method.

## 2020-11-29 NOTE — Progress Notes (Signed)
Pharmacy Antibiotic Note  Barbara Hill is a 80 y.o. female admitted on 11/27/2020 with hip fracture on 4/2, s/p left cannulated hip pinning on 4/7, and s/p acute right MCA stroke on 4/8.  Pharmacy has been consulted for Unasyn dosing for aspiration pneumonia.   WBC 18 (dexamethasone 4/7), SCr 0.85, Afebrile Weight 42 kg  Plan:  Unasyn 1.5 g IV q6h  Follow up renal function, culture results, and clinical course.   Height: 5\' 3"  (160 cm) Weight: 42.3 kg (93 lb 4.8 oz) IBW/kg (Calculated) : 52.4  Temp (24hrs), Avg:98.2 F (36.8 C), Min:97.7 F (36.5 C), Max:98.8 F (37.1 C)  Recent Labs  Lab 11/27/20 1313 11/28/20 0826 11/29/20 0409  WBC 17.0* 18.6* 18.0*  CREATININE 0.98 0.87 0.85    Estimated Creatinine Clearance: 36.4 mL/min (by C-G formula based on SCr of 0.85 mg/dL).    Allergies  Allergen Reactions  . Dobutamine Other (See Comments)    Heart beating hard with CP, neck pain, and weakness  . Epinephrine Other (See Comments)    Fast heart beat  . Aspirin Nausea And Vomiting    History of stomach ulcer  . Clarithromycin Other (See Comments)    Yeast infection  . Doxycycline Other (See Comments)    Makes stomach hurt  . Excedrin Extra Strength [Aspirin-Acetaminophen-Caffeine] Nausea And Vomiting  . Penicillins Other (See Comments)    Yeast infection  . Prednisone Other (See Comments)    Shakes   . Propoxyphene Nausea And Vomiting    Antimicrobials this admission: 4/7 Cefazolin perioperative doses 4/8 Unasyn >>   Dose adjustments this admission:   Microbiology results:   Thank you for allowing pharmacy to be a part of this patient's care.  6/8 PharmD, BCPS Clinical Pharmacist [facility] main pharmacy [phone] 11/29/2020 6:37 PM

## 2020-11-29 NOTE — Progress Notes (Signed)
Report given to Chrissy RN with care link. Patient transferred via stretcher with care link to [place]. Patient alert to self, family, not to situation and hospital. No sensation noted to left side upon leaving.   RUE +5 strength  LUE drift no movement  RLE +5 strength  LLE drift no movement

## 2020-11-29 NOTE — Evaluation (Signed)
SLP Cancellation Note  Patient Details Name: Barbara Hill MRN: [id number] DOB: [birth year]   Cancelled treatment:       Reason Eval/Treat Not Completed: Other (comment) (pt without adequate mentation for po/swallow evaluation)  Rolena Infante, MS [facility] SLP Acute Rehab Services Office [phone] Pager [phone]   Chales Abrahams 11/29/2020, 1:31 PM

## 2020-11-29 NOTE — Progress Notes (Addendum)
PROGRESS NOTE    Barbara Hill  CXK:[id number] DOB: October 15, 1940 DOA: 11/27/2020 PCP: Richmond Campbell., PA-C   Chief Complaint  Patient presents with  . Fall    Brief Narrative: 80 year old female with past medical history of dementia, anxiety, depression, s/p R MCA stent in 2017 complicated by CVA, PVD s/p embolectomy for popliteal occlusions, CKD 3, cerebral aneurysm, COPD, GERD, paroxysmal SVT, orthostatic hypotension, mitral valve prolapse, recent hospitalization from 3/28-4/1 for CAP who presented to [facility] for evaluation of knee pain following Barbara Hill fall on April 2.  She was found to have Barbara Hill left hip fracture and is now status post surgery with ortho on 4/7.  Her hospitalization was complicated by delirium with left upper extremity weakness on 4/8 and code stroke was called.  She's being transferred to [facility] for diagnostic cerebral angiogram on Monday to assess for possible placement of stent in severely narrowed right MCA branch with Dr. [name] on Monday 4/11.   Assessment & Plan:   Principal Problem:   Hip fracture (HCC) Active Problems:   Paroxysmal SVT (supraventricular tachycardia) (HCC)   History of CVA (cerebrovascular accident)   CKD (chronic kidney disease), stage III (HCC)   Hypertension   Prolonged QT interval  Addendum NG tube had been ordered after discussion with neuro who recommended NG for medication administration in setting of pt being NPO with AMS.  NG attempted x2, on 2nd attempt in R mainstem bronchus with distal tip in right lower lobe.  NG discontinued.  For now, will give rectal aspirin while unable to take PO.  Consider attempting NG placement again 4/9 (consider panda or cortrak).  CXR with findings concerning for aspiration.  Started on unasyn.    Stroke Like Symptoms Code stroke called 4/8 am for AMS and LUE weakness Seen in the AM feeding herself, though with some mild delirium  Last known normal was ~1050.  Symptoms noted around 1102.   CT head without  acute abnormality CTA head/neck with focal high grade stenosis of Barbara Hill right proximal M2 MCA branch with distal opacification, new from prior. approximately 15 mm area penumbra/mismatch in the posterior right MCA territory, which likely is in the territory supplied by the M2 MCA stenosis.  Streak artifact limits evaluation for R MCA in stent stenosis, but there is opacification proximal and distal to stent (similar to prior and consistent with stent patency).  Redemonstrated flow diverter device within the M1 right MCA, similar size of Barbara Hill 3 mm aneurysm at this site.  Similar 1-2 mm anteriorly projecting outpouching arising from the anterior communicating artery, which may represent Barbara Hill small aneurysm versus infundibulum with small associate vessel not seen. Neurology c/s, appreciate recs -> Aspirin/plavix, MRI, [facility] neurointerventional rads Lipid, A1c pending Recent echo done 10/2020 -> will defer repeat echo to neurology given this was recently done (notable for moderately elevated PASP and small pericardial effusion - EF 65-70%) Permissive hypertension PT/OT/SLP  Delirium  Dementia  Acute Metabolic Encephalopathy Likely 2/2 stroke, hospitalization, recent surgery Daughter notes at baseline, before recent hospitalizations - got around independently, could microwave simple meals, but not cook/clean.  Used bathroom independently.  Needed assistance with dressing at times.  Delirium precautions Minimized deliriogenic meds  Dysphagia In setting of AMS and stroke, place NG tube for administration of aspirin/plavix  Follow SLP eval   Acute mildly impacted and displaced left femoral neck fracture s/p mechanical fall CT L hip with mildly impacted and displaced fx of L femoral neck S/p L cannulated  hip pinning on 4/7 Lovenox for DVT ppx  PT/OT eval WBAT LLE Ortho c/s, appreciate recs  Prolonged QT Replace lytes Flecainide being held Improved today, consider resuming tomorrow if QT  remains appropriate Repeat EKG in AM Hold beta blocker for permissive hypertension  Paroxysmal SVT Holding flecainide as above Holding home beta-blocker  CKD 3a, at baseline  Hypertension Permissive hypertension - hold amlodipine and Toprol-XL and losartan  History of CVA, at baseline As above  Hypomagnesemia Improved, replace and follow  Thrombocytosis, likely reactive  Leukocytosis, likely reactive  DVT prophylaxis: lovenox Code Status: DNR Family Communication: daughter over phone Disposition:   Status is: Inpatient  Remains inpatient appropriate because:Inpatient level of care appropriate due to severity of illness   Dispo: The patient is from: Home              Anticipated d/c is to: pending              Patient currently is not medically stable to d/c.   Difficult to place patient No  Consultants:   none  Procedures:  S/p L cannulated hip pinning on 4/7  Antimicrobials:  Anti-infectives (From admission, onward)   Start     Dose/Rate Route Frequency Ordered Stop   11/28/20 1930  ceFAZolin (ANCEF) IVPB 2g/100 mL premix        2 g 200 mL/hr over 30 Minutes Intravenous Every 6 hours 11/28/20 1812 11/29/20 0125   11/28/20 0600  ceFAZolin (ANCEF) IVPB 2g/100 mL premix        2 g 200 mL/hr over 30 Minutes Intravenous On call to O.R. 11/27/20 1841 11/28/20 1605      Subjective: Confused, pain is ok   Objective: Vitals:   11/29/20 0738 11/29/20 0907 11/29/20 1100 11/29/20 1101  BP:  (!) 147/65 (!) 176/79 (!) 176/79  Pulse:  75 75   Resp:  16 20 18   Temp:  98.7 F (37.1 C) 98.8 F (37.1 C) 98.8 F (37.1 C)  TempSrc:  Oral Oral Oral  SpO2: 94% 92% 95% 95%  Weight:      Height:        Intake/Output Summary (Last 24 hours) at 11/29/2020 1312 Last data filed at 11/29/2020 0544 Gross per 24 hour  Intake 1572.16 ml  Output 875 ml  Net 697.16 ml   Filed Weights   11/27/20 1419  Weight: 42.3 kg    Examination:  General exam: Appears  calm and comfortable  Respiratory system: Clear to auscultation. Respiratory effort normal. Cardiovascular system: S1 & S2 heard, RRR.  Gastrointestinal system: Abdomen is nondistended, soft and nontender Central nervous system: Seen on 2 occasions, earlier in the morning, patient was alert and eating breakfast, but confused - moving all extremities spontaneously.  Called by nursing later and on reevaluation, more lethargic, followed commands intermittently, but unable to raise left upper extremity.  Moved both lower extremities, unable to lift legs off bed unclear if deficit or related to lethargy, not following commands, and or LLE pain with recent surgery. Extremities: LLE with dressing intact  Data Reviewed: I have personally reviewed following labs and imaging studies  CBC: Recent Labs  Lab 11/27/20 1313 11/28/20 0826 11/29/20 0409  WBC 17.0* 18.6* 18.0*  HGB 12.4 11.6* 10.2*  HCT 37.3 35.0* 32.1*  MCV 89.7 90.7 94.4  PLT 617* 528* 459*    Basic Metabolic Panel: Recent Labs  Lab 11/27/20 1313 11/28/20 0826 11/29/20 0409  NA 135 138 134*  K 3.5 3.5 4.2  CL  98 101 99  CO2 23 25 24   GLUCOSE 132* 107* 98  BUN 18 20 20   CREATININE 0.98 0.87 0.85  CALCIUM 9.7 9.6 9.3  MG  --  1.5*  --     GFR: Estimated Creatinine Clearance: 36.4 mL/min (by C-G formula based on SCr of 0.85 mg/dL).  Liver Function Tests: No results for input(s): AST, ALT, ALKPHOS, BILITOT, PROT, ALBUMIN in the last 168 hours.  CBG: Recent Labs  Lab 11/29/20 1133  GLUCAP 100*     Recent Results (from the past 240 hour(s))  Resp Panel by RT-PCR (Flu Natha Guin&B, Covid) Nasopharyngeal Swab     Status: None   Collection Time: 11/27/20  3:39 PM   Specimen: Nasopharyngeal Swab; Nasopharyngeal(NP) swabs in vial transport medium  Result Value Ref Range Status   SARS Coronavirus 2 by RT PCR NEGATIVE NEGATIVE Final    Comment: (NOTE) SARS-CoV-2 target nucleic acids are NOT DETECTED.  The SARS-CoV-2 RNA is  generally detectable in upper respiratory specimens during the acute phase of infection. The lowest concentration of SARS-CoV-2 viral copies this assay can detect is 138 copies/mL. Santos Sollenberger negative result does not preclude SARS-Cov-2 infection and should not be used as the sole basis for treatment or other patient management decisions. Ellard Nan negative result may occur with  improper specimen collection/handling, submission of specimen other than nasopharyngeal swab, presence of viral mutation(s) within the areas targeted by this assay, and inadequate number of viral copies(<138 copies/mL). Aswad Wandrey negative result must be combined with clinical observations, patient history, and epidemiological information. The expected result is Negative.  Fact Sheet for Patients:  01/29/21  Fact Sheet for Healthcare Providers:  01/27/21  This test is no t yet approved or cleared by the [employer] FDA and  has been authorized for detection and/or diagnosis of SARS-CoV-2 by FDA under an Emergency Use Authorization (EUA). This EUA will remain  in effect (meaning this test can be used) for the duration of the COVID-19 declaration under Section 564(b)(1) of the Act, 21 U.S.C.section 360bbb-3(b)(1), unless the authorization is terminated  or revoked sooner.       Influenza Kitti Mcclish by PCR NEGATIVE NEGATIVE Final   Influenza B by PCR NEGATIVE NEGATIVE Final    Comment: (NOTE) The Xpert Xpress SARS-CoV-2/FLU/RSV plus assay is intended as an aid in the diagnosis of influenza from Nasopharyngeal swab specimens and should not be used as Courtni Balash sole basis for treatment. Nasal washings and aspirates are unacceptable for Xpert Xpress SARS-CoV-2/FLU/RSV testing.  Fact Sheet for Patients: [employer]ious[employer]ker.it  Fact Sheet for Healthcare Providers: [place]  This test is not yet approved or cleared by the [employer] FDA and has been authorized for detection and/or diagnosis of SARS-CoV-2 by FDA under an Emergency Use Authorization (EUA). This EUA will remain in effect (meaning this test can be used) for the duration of the COVID-19 declaration under Section 564(b)(1) of the Act, 21 U.S.C. section 360bbb-3(b)(1), unless the authorization is terminated or revoked.  Performed at [facility], [address], [place], [address] [place]   Surgical PCR screen     Status: None   Collection Time: 11/28/20 11:58 AM   Specimen: Nasal Mucosa; Nasal Swab  Result Value Ref Range Status   MRSA, PCR NEGATIVE NEGATIVE Final   Staphylococcus aureus NEGATIVE NEGATIVE Final    Comment: (NOTE) The Xpert SA Assay (FDA approved for NASAL specimens in patients 19 years of age and older), is one component of Shraga Custard comprehensive surveillance program. It is  not intended to diagnose infection nor to guide or monitor treatment. Performed at [facility], [address] [place], [place] [address]          Radiology Studies: DG Knee 2 Views Left  Result Date: 11/27/2020 CLINICAL DATA:  Pain following fall EXAM: LEFT KNEE - 1-2 VIEW COMPARISON:  None. FINDINGS: Frontal and lateral views obtained. No fracture or dislocation. No joint effusion. Joint spaces appear normal. No erosive change. IMPRESSION: No fracture, dislocation, or joint effusion. No evident arthropathy. Electronically Signed   By: Bretta Bang III M.D.   On: 11/27/2020 14:20   CT Head Wo Contrast  Result Date: 11/27/2020 CLINICAL DATA:  Fall on 11/23/2020. Diffuse left-sided pain. History of dementia. EXAM: CT HEAD WITHOUT CONTRAST CT CERVICAL SPINE WITHOUT CONTRAST TECHNIQUE: Multidetector CT imaging of the head and cervical spine was performed following the standard protocol without intravenous contrast. Multiplanar CT image reconstructions of the cervical spine were also generated. COMPARISON:  Head CT 09/17/2020.  Head and neck CTA 09/17/2020. Head MRI 09/18/2020. FINDINGS: CT HEAD FINDINGS Brain: There is no evidence of an acute infarct, intracranial hemorrhage, mass, midline shift, or extra-axial fluid collection. Hypodensities in the cerebral white matter bilaterally are unchanged and nonspecific but compatible with chronic small vessel ischemic disease which is mild for age. Chronic lacunar infarcts in the right basal ganglia are unchanged. There is mild cerebral atrophy. Vascular: Calcified atherosclerosis at the skull base. Right MCA flow diverter device. Skull: No fracture or suspicious osseous lesion. Sinuses/Orbits: Small volume bubbly fluid in the left maxillary sinus. Persistent small left mastoid effusion. Right cataract extraction. Other: None. CT CERVICAL SPINE FINDINGS Alignment: Chronic mild reversal of the normal cervical lordosis. Unchanged trace retrolisthesis of C2 on C3 and C5 on C6 and grade 1 anterolisthesis of C4 on C5 and C7 on T1. Skull base and vertebrae: No acute fracture or suspicious osseous lesion. Moderate median C1-2 arthropathy. Soft tissues and spinal canal: No prevertebral fluid or swelling. No visible canal hematoma. Disc levels: Cervical disc degeneration most advanced at C5-6 where there is severe disc space narrowing, degenerative endplate sclerosis and spurring, and vacuum disc. Mild disc degeneration at other levels. Severe facet arthrosis on the right at C3-4, on the left at C4-5, and on the left at C7-T1. Mild-to-moderate multilevel neural foraminal stenosis. Upper chest: Clear lung apices. Other: None. IMPRESSION: 1. No evidence of acute intracranial abnormality. 2. Mild chronic small vessel ischemic disease. 3. No acute cervical spine fracture. Electronically Signed   By: Sebastian Ache M.D.   On: 11/27/2020 14:25   CT Cervical Spine Wo Contrast  Result Date: 11/27/2020 CLINICAL DATA:  Fall on 11/23/2020. Diffuse left-sided pain. History of dementia. EXAM: CT HEAD WITHOUT  CONTRAST CT CERVICAL SPINE WITHOUT CONTRAST TECHNIQUE: Multidetector CT imaging of the head and cervical spine was performed following the standard protocol without intravenous contrast. Multiplanar CT image reconstructions of the cervical spine were also generated. COMPARISON:  Head CT 09/17/2020. Head and neck CTA 09/17/2020. Head MRI 09/18/2020. FINDINGS: CT HEAD FINDINGS Brain: There is no evidence of an acute infarct, intracranial hemorrhage, mass, midline shift, or extra-axial fluid collection. Hypodensities in the cerebral white matter bilaterally are unchanged and nonspecific but compatible with chronic small vessel ischemic disease which is mild for age. Chronic lacunar infarcts in the right basal ganglia are unchanged. There is mild cerebral atrophy. Vascular: Calcified atherosclerosis at the skull base. Right MCA flow diverter device. Skull: No fracture or suspicious osseous lesion. Sinuses/Orbits: Small  volume bubbly fluid in the left maxillary sinus. Persistent small left mastoid effusion. Right cataract extraction. Other: None. CT CERVICAL SPINE FINDINGS Alignment: Chronic mild reversal of the normal cervical lordosis. Unchanged trace retrolisthesis of C2 on C3 and C5 on C6 and grade 1 anterolisthesis of C4 on C5 and C7 on T1. Skull base and vertebrae: No acute fracture or suspicious osseous lesion. Moderate median C1-2 arthropathy. Soft tissues and spinal canal: No prevertebral fluid or swelling. No visible canal hematoma. Disc levels: Cervical disc degeneration most advanced at C5-6 where there is severe disc space narrowing, degenerative endplate sclerosis and spurring, and vacuum disc. Mild disc degeneration at other levels. Severe facet arthrosis on the right at C3-4, on the left at C4-5, and on the left at C7-T1. Mild-to-moderate multilevel neural foraminal stenosis. Upper chest: Clear lung apices. Other: None. IMPRESSION: 1. No evidence of acute intracranial abnormality. 2. Mild chronic small  vessel ischemic disease. 3. No acute cervical spine fracture. Electronically Signed   By: Sebastian Ache M.D.   On: 11/27/2020 14:25   Pelvis Portable  Result Date: 11/28/2020 CLINICAL DATA:  Postop. EXAM: PORTABLE PELVIS 1-2 VIEWS COMPARISON:  Preoperative radiograph yesterday. FINDINGS: Three screws traverse left femoral neck fracture. Slightly improved fracture alignment from prior exam. No periprosthetic lucency. Bones diffusely under mineralized. No additional fracture. IMPRESSION: Three screws traverse left femoral neck fracture. No immediate postoperative complication. Electronically Signed   By: Narda Rutherford M.D.   On: 11/28/2020 18:54   CT Hip Left Wo Contrast  Result Date: 11/27/2020 CLINICAL DATA:  Left hip pain after falling today. Abnormal radiographs with questionable left femoral head fracture. EXAM: CT OF THE LEFT HIP WITHOUT CONTRAST TECHNIQUE: Multidetector CT imaging of the left hip was performed according to the standard protocol. Multiplanar CT image reconstructions were also generated. COMPARISON:  Radiographs 11/27/2020 and 09/17/2020. Pelvic CT 09/17/2020. FINDINGS: Bones/Joint/Cartilage There is Avenell Sellers new mildly impacted subcapital fracture of the left femoral neck, best seen on the reformatted images. This demonstrates up to 6 mm of inferior medial displacement on coronal image 33/3. There are underlying degenerative changes of the left hip with joint space narrowing and femoral head osteophytes. No dislocation or fracture of the left hemipelvis. There is Amandalee Lacap small left hip joint effusion. Ligaments Suboptimally assessed by CT. Muscles and Tendons Unremarkable. Soft tissues Mild periarticular soft tissue swelling without focal hematoma or foreign body. Iliofemoral atherosclerosis and diffuse sigmoid diverticulosis are noted. IMPRESSION: 1. Acute mildly impacted and displaced fracture of the left femoral neck. 2. Underlying moderate left hip osteoarthritis. 3. No periarticular hematoma.  Electronically Signed   By: Carey Bullocks M.D.   On: 11/27/2020 15:21   CT CEREBRAL PERFUSION W CONTRAST  Result Date: 11/29/2020 CLINICAL DATA:  Acute stroke suspected. EXAM: CT HEAD WITHOUT CONTRAST CT ANGIOGRAPHY HEAD AND NECK CT PERFUSION BRAIN TECHNIQUE: Multidetector CT imaging of the head and neck was performed using the standard protocol during bolus administration of intravenous contrast. Multiplanar CT image reconstructions and MIPs were obtained to evaluate the vascular anatomy. Carotid stenosis measurements (when applicable) are obtained utilizing NASCET criteria, using the distal internal carotid diameter as the denominator. Multiphase CT imaging of the brain was performed following IV bolus contrast injection. Subsequent parametric perfusion maps were calculated using RAPID software. CONTRAST:  60mL OMNIPAQUE IOHEXOL 350 MG/ML SOLN; 40mL OMNIPAQUE IOHEXOL 350 MG/ML SOLN COMPARISON:  CTA 09/17/2020 FINDINGS: CTA NECK FINDINGS Aortic arch: Brachiocephalic and common carotid origins are not imaged. Otherwise, patent great vessels. Right carotid system:  No evidence of dissection, significant stenosis (50% or greater) or occlusion. Left carotid system: Left common carotid artery origin is not imaged. No visible dissection, significant stenosis (50% or greater), or occlusion. Vertebral arteries: Codominant. No evidence of dissection, stenosis (50% or greater) or occlusion. Similar multifocal mild stenosis of the right V1 vertebral artery. Skeleton: No evidence of acute bony abnormality on limited assessment. Similar alignment. Similar multilevel degenerative change, including moderate degenerative disc disease at C5-C6 with disc height loss, vacuum disc phenomenon and posterior disc osteophyte complex. Other neck: No mass or adenopathy. Upper chest: Emphysema.  Visualized lung apices are clear. Review of the MIP images confirms the above findings CTA HEAD FINDINGS Anterior circulation: Patent bilateral  internal carotid arteries with similar calcific atherosclerosis. No evidence of greater than 50% stenosis of the ICAs. Redemonstrated fluid over device within the right M1 MCA. Artifact limits evaluation for in stent stenosis; however, there is flow proximal and distal to the stent, compatible with patency. Focal high-grade stenosis of Terea Neubauer right proximal M2 MCA branch (series 8, images 78 through 81) with distal opacification, which is new from prior. Left MCA and ACAs are patent. Similar 1-2 mm vascular protrusion projecting anteriorly from the anterior communicating artery Posterior circulation: No evidence large vessel occlusion or proximal hemodynamically significant stenosis. Similar right posterior communicating artery. Venous sinuses: As permitted by contrast timing, patent. Review of the MIP images confirms the above findings CT Brain Perfusion Findings: CBF (<30%) Volume: 0mL Perfusion (Tmax>6.0s) volume: 15mL Mismatch Volume: 15mL in the posterior right MCA territory, which likely is in the territory supplied by the M2 MCA stenosis described above. Infarction Location:None. IMPRESSION: 1. Focal high-grade stenosis of Chamille Werntz right proximal M2 MCA branch (series 8, images 78 through 81) with distal opacification, new from prior. 2. Approximately 15 mm area penumbra/mismatch in the posterior right MCA territory, which likely is in the territory supplied by the M2 MCA stenosis described above. No core infarct identified. 3. Streak artifact limits evaluation for right MCA in-stent stenosis; however, there is opacification proximal and distal to the stent, similar to prior and consistent with stent patency. 4. Redemonstrated flow diverter device within the M1 right MCA. Similar size of Marti Acebo 3 mm aneurysm at this site. 5. Similar 1-2 mm anteriorly projecting outpouching arising from the anterior communicating artery, which may represent Sirena Riddle small aneurysm versus infundibulum with small associated vessel not seen. CTA  findings were communicated on 11/29/2020 at 12:08 pm to provider Dr. Otelia LimesLindzen via telephones. Perfusion findings communicated on 11/29/2020 at 12:19 pm to provider Dr. Otelia LimesLindzen via telephone. Electronically Signed   By: Feliberto HartsFrederick S Jones MD   On: 11/29/2020 12:31   DG Chest Portable 1 View  Result Date: 11/27/2020 CLINICAL DATA:  Left hip fracture. EXAM: PORTABLE CHEST 1 VIEW COMPARISON:  November 18, 2020. FINDINGS: The heart size and mediastinal contours are within normal limits. No pneumothorax or pleural effusion is noted. Mild bibasilar subsegmental atelectasis is noted. The visualized skeletal structures are unremarkable. IMPRESSION: Mild bibasilar subsegmental atelectasis. Electronically Signed   By: Lupita RaiderJames  Green Jr M.D.   On: 11/27/2020 16:45   DG C-Arm 1-60 Min-No Report  Result Date: 11/28/2020 Fluoroscopy was utilized by the requesting physician.  No radiographic interpretation.   DG Hip Port Unilat With Pelvis 1V Left  Result Date: 11/28/2020 CLINICAL DATA:  Post left hip pinning EXAM: DG HIP (WITH OR WITHOUT PELVIS) 1V PORT LEFT COMPARISON:  None. FINDINGS: Screws within the left femoral neck. No complicating feature. Mild degenerative changes  in the left hip. IMPRESSION: Pinning of the left hip.  No visible complicating feature. Electronically Signed   By: Charlett Nose M.D.   On: 11/28/2020 18:04   DG HIP OPERATIVE UNILAT W OR W/O PELVIS LEFT  Result Date: 11/28/2020 CLINICAL DATA:  Left hip pinning. EXAM: OPERATIVE LEFT HIP (WITH PELVIS IF PERFORMED) TECHNIQUE: Fluoroscopic spot image(s) were submitted for interpretation post-operatively. COMPARISON:  Preoperative radiograph yesterday. FINDINGS: Three fluoroscopic spot views obtained of the left hip in the operating room in frontal and lateral projections. Two screws traverse femoral neck fracture. Total fluoroscopy time 43 seconds. IMPRESSION: Procedural fluoroscopy after left hip fracture pinning. Electronically Signed   By: Narda Rutherford M.D.    On: 11/28/2020 18:52   DG Hip Unilat W or Wo Pelvis 2-3 Views Left  Result Date: 11/27/2020 CLINICAL DATA:  Pain following fall EXAM: DG HIP (WITH OR WITHOUT PELVIS) 2-3V LEFT COMPARISON:  None. FINDINGS: Frontal pelvis as well as frontal and lateral views of the left hip joint were obtained. There is Shalee Paolo subtle lucency in the medial aspect of the left femoral head, concerning for potential incomplete fracture in this area. No other findings suggesting potential fracture. No dislocation. There is mild symmetric narrowing of each hip joint. No erosive change. IMPRESSION: Questionable incomplete fracture in the medial aspect of the left femoral head. This appearance warrants further assessment with CT or MR of the left hip to further evaluate. MR would be the optimum study of choice to further evaluate, given ability to demonstrate marrow edema with MR. No other findings suggesting potential fracture. No dislocation. Mild symmetric narrowing each hip joint. Electronically Signed   By: Bretta Bang III M.D.   On: 11/27/2020 14:19   CT HEAD CODE STROKE WO CONTRAST  Result Date: 11/29/2020 CLINICAL DATA:  Code stroke. Acute neuro deficit. Rule out stroke. Left-sided weakness. EXAM: CT HEAD WITHOUT CONTRAST TECHNIQUE: Contiguous axial images were obtained from the base of the skull through the vertex without intravenous contrast. COMPARISON:  CT head 11/27/2020 FINDINGS: Brain: Generalized atrophy. Chronic microvascular ischemic change in the white matter. Chronic lacunar infarction in the right head of caudate and adjacent internal capsule, unchanged. Negative for acute infarct, hemorrhage, mass. Vascular: Negative for hyperdense vessel. Stent in the right M1 segment unchanged. Skull: Negative Sinuses/Orbits: Air-fluid level left maxillary sinus. Remaining sinuses clear. Right cataract extraction. No orbital mass. Other: Image quality degraded by motion. ASPECTS [facility] Stroke Program Early CT Score) -  Ganglionic level infarction (caudate, lentiform nuclei, internal capsule, insula, M1-M3 cortex): 7 - Supraganglionic infarction (M4-M6 cortex): 3 Total score (0-10 with 10 being normal): 10 IMPRESSION: 1. No acute abnormality and no change from 2 days prior 2. ASPECTS is 10 3. Atrophy and chronic ischemic changes, stable from the prior study. 4. These results were called by telephone at the time of interpretation on 11/29/2020 at 11:56 am to provider Affie Gasner Shaune Spittle , who verbally acknowledged these results. Electronically Signed   By: Marlan Palau M.D.   On: 11/29/2020 11:57   CT ANGIO HEAD NECK W WO CM (CODE STROKE)  Result Date: 11/29/2020 CLINICAL DATA:  Acute stroke suspected. EXAM: CT HEAD WITHOUT CONTRAST CT ANGIOGRAPHY HEAD AND NECK CT PERFUSION BRAIN TECHNIQUE: Multidetector CT imaging of the head and neck was performed using the standard protocol during bolus administration of intravenous contrast. Multiplanar CT image reconstructions and MIPs were obtained to evaluate the vascular anatomy. Carotid stenosis measurements (when applicable) are obtained utilizing NASCET criteria, using the distal  internal carotid diameter as the denominator. Multiphase CT imaging of the brain was performed following IV bolus contrast injection. Subsequent parametric perfusion maps were calculated using RAPID software. CONTRAST:  60mL OMNIPAQUE IOHEXOL 350 MG/ML SOLN; 40mL OMNIPAQUE IOHEXOL 350 MG/ML SOLN COMPARISON:  CTA 09/17/2020 FINDINGS: CTA NECK FINDINGS Aortic arch: Brachiocephalic and common carotid origins are not imaged. Otherwise, patent great vessels. Right carotid system: No evidence of dissection, significant stenosis (50% or greater) or occlusion. Left carotid system: Left common carotid artery origin is not imaged. No visible dissection, significant stenosis (50% or greater), or occlusion. Vertebral arteries: Codominant. No evidence of dissection, stenosis (50% or greater) or occlusion. Similar multifocal mild  stenosis of the right V1 vertebral artery. Skeleton: No evidence of acute bony abnormality on limited assessment. Similar alignment. Similar multilevel degenerative change, including moderate degenerative disc disease at C5-C6 with disc height loss, vacuum disc phenomenon and posterior disc osteophyte complex. Other neck: No mass or adenopathy. Upper chest: Emphysema.  Visualized lung apices are clear. Review of the MIP images confirms the above findings CTA HEAD FINDINGS Anterior circulation: Patent bilateral internal carotid arteries with similar calcific atherosclerosis. No evidence of greater than 50% stenosis of the ICAs. Redemonstrated fluid over device within the right M1 MCA. Artifact limits evaluation for in stent stenosis; however, there is flow proximal and distal to the stent, compatible with patency. 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 Left MCA and ACAs are patent. Similar 1-2 mm vascular protrusion projecting anteriorly from the anterior communicating artery Posterior circulation: No evidence large vessel occlusion or proximal hemodynamically significant stenosis. Similar right posterior communicating artery. Venous sinuses: As permitted by contrast timing, patent. Review of the MIP images confirms the above findings CT Brain Perfusion Findings: CBF (<30%) Volume: 0mL Perfusion (Tmax>6.0s) volume: 15mL Mismatch Volume: 15mL in the posterior right MCA territory, which likely is in the territory supplied by the M2 MCA stenosis described above. Infarction Location:None. IMPRESSION: 1. 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 2. Approximately 15 mm area penumbra/mismatch in the posterior right MCA territory, which likely is in the territory supplied by the M2 MCA stenosis described above. No core infarct  identified. 3. Streak artifact limits evaluation for right MCA in-stent stenosis; however, there is opacification proximal and distal to the stent, similar to prior and consistent with stent patency. 4. Redemonstrated flow diverter device within the M1 right MCA. 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 5. Similar 1-2 mm anteriorly projecting outpouching arising from the anterior communicating artery, which may represent Esaw Knippel small aneurysm versus infundibulum with small associated vessel not seen. CTA findings were communicated on 11/29/2020 at 12:08 pm to provider Dr. Otelia Limes via telephones. Perfusion findings communicated on 11/29/2020 at 12:19 pm to provider Dr. Otelia Limes via telephone. Electronically Signed   By: Feliberto Harts MD   On: 11/29/2020 12:31        Scheduled Meds: .  stroke: mapping our early stages of recovery book   Does not apply Once  . amLODipine  5 mg Oral Daily  . aspirin EC  81 mg Oral Daily  . Chlorhexidine Gluconate Cloth  6 each Topical Daily  . clopidogrel  75 mg Oral Daily  . docusate sodium  100 mg Oral BID  . enoxaparin (LOVENOX) injection  40 mg Subcutaneous Q24H  .  famotidine  20 mg Oral Daily  . ferrous sulfate  325 mg Oral TID PC  . fluticasone furoate-vilanterol  1 puff Inhalation Daily  . mupirocin ointment  1 application Nasal BID  . ondansetron  4 mg Oral Q12H   Or  . ondansetron (ZOFRAN) IV  4 mg Intravenous Q12H  . pantoprazole (PROTONIX) IV  40 mg Intravenous Q24H  . senna  1 tablet Oral BID   Continuous Infusions: . sodium chloride 250 mL (11/28/20 2011)     LOS: 2 days    Time spent: over 30 min    Lacretia Nicks, MD [facility]   To contact the attending provider between 7A-7P or the covering provider during after hours 7P-7A, please log into the web site [email or web address] and access using universal [place] password for that web site. If you do not have the password, please call the hospital operator.  11/29/2020, 1:12 PM

## 2020-11-29 NOTE — Progress Notes (Signed)
Chaplain responded to request from pt's family to visit after hearing news of their mother's condition.  Chaplain established a ministry of care and support while listening to pt's daughter and grandson's grief over the news and the future of their beloved.  Chaplain prayed with daughter and grandson.  Vernell Morgans Chaplain

## 2020-11-29 NOTE — Progress Notes (Signed)
Subjective: 1 Day Post-Op s/p Procedure(s): CANNULATED HIP PINNING  Patient pleasantly confused, moderate left hip pain this morning. Denies CP, SOB, Calf pain, numbness, or tingling. No other complaints.   Objective:  PE: VITALS:   Vitals:   11/29/20 0500 11/29/20 0519 11/29/20 0520 11/29/20 0738  BP: (!) 143/70 (!) 150/72 (!) 150/72   Pulse: 68 74 72   Resp:  18    Temp:  98 F (36.7 C) 98 F (36.7 C)   TempSrc:  Oral Oral   SpO2:  95% 94% 94%  Weight:      Height:       General: Alert, elderly female, no acute distress, sitting up in bed. Oriented to self only.  MUSCULOSKELETAL: Surgical dressing intact, no drainage. Dorsiflexion and plantarflexion intact bilaterally.  Patient able to move all toes of bilateral feet.  Patient endorses sensation to all aspects of bilateral feet. 2+ DP pulses.   LABS  Results for orders placed or performed during the hospital encounter of 11/27/20 (from the past 24 hour(s))  CBC     Status: Abnormal   Collection Time: 11/28/20  8:26 AM  Result Value Ref Range   WBC 18.6 (H) 4.0 - 10.5 K/uL   RBC 3.86 (L) 3.87 - 5.11 MIL/uL   Hemoglobin 11.6 (L) 12.0 - 15.0 g/dL   HCT 96.2 (L) 83.6 - 62.9 %   MCV 90.7 80.0 - 100.0 fL   MCH 30.1 26.0 - 34.0 pg   MCHC 33.1 30.0 - 36.0 g/dL   RDW 47.6 54.6 - 50.3 %   Platelets 528 (H) 150 - 400 K/uL   nRBC 0.0 0.0 - 0.2 %  Basic metabolic panel     Status: Abnormal   Collection Time: 11/28/20  8:26 AM  Result Value Ref Range   Sodium 138 135 - 145 mmol/L   Potassium 3.5 3.5 - 5.1 mmol/L   Chloride 101 98 - 111 mmol/L   CO2 25 22 - 32 mmol/L   Glucose, Bld 107 (H) 70 - 99 mg/dL   BUN 20 8 - 23 mg/dL   Creatinine, Ser 5.46 0.44 - 1.00 mg/dL   Calcium 9.6 8.9 - 56.8 mg/dL   GFR, Estimated >12 >75 mL/min   Anion gap 12 5 - 15  Magnesium     Status: Abnormal   Collection Time: 11/28/20  8:26 AM  Result Value Ref Range   Magnesium 1.5 (L) 1.7 - 2.4 mg/dL  Surgical PCR screen     Status: None    Collection Time: 11/28/20 11:58 AM   Specimen: Nasal Mucosa; Nasal Swab  Result Value Ref Range   MRSA, PCR NEGATIVE NEGATIVE   Staphylococcus aureus NEGATIVE NEGATIVE  Type and screen [facility]     Status: None   Collection Time: 11/28/20  1:30 PM  Result Value Ref Range   ABO/RH(D) AB POS    Antibody Screen NEG    Sample Expiration      12/01/2020,2359 Performed at [facility], [address] [place], [place] [address]   CBC     Status: Abnormal   Collection Time: 11/29/20  4:09 AM  Result Value Ref Range   WBC 18.0 (H) 4.0 - 10.5 K/uL   RBC 3.40 (L) 3.87 - 5.11 MIL/uL   Hemoglobin 10.2 (L) 12.0 - 15.0 g/dL   HCT 74.9 (L) 44.9 - 67.5 %   MCV 94.4 80.0 - 100.0 fL   MCH 30.0 26.0 - 34.0  pg   MCHC 31.8 30.0 - 36.0 g/dL   RDW 01.6 01.0 - 93.2 %   Platelets 459 (H) 150 - 400 K/uL   nRBC 0.0 0.0 - 0.2 %  Basic metabolic panel     Status: Abnormal   Collection Time: 11/29/20  4:09 AM  Result Value Ref Range   Sodium 134 (L) 135 - 145 mmol/L   Potassium 4.2 3.5 - 5.1 mmol/L   Chloride 99 98 - 111 mmol/L   CO2 24 22 - 32 mmol/L   Glucose, Bld 98 70 - 99 mg/dL   BUN 20 8 - 23 mg/dL   Creatinine, Ser 3.55 0.44 - 1.00 mg/dL   Calcium 9.3 8.9 - 73.2 mg/dL   GFR, Estimated >20 >25 mL/min   Anion gap 11 5 - 15    DG Knee 2 Views Left  Result Date: 11/27/2020 CLINICAL DATA:  Pain following fall EXAM: LEFT KNEE - 1-2 VIEW COMPARISON:  None. FINDINGS: Frontal and lateral views obtained. No fracture or dislocation. No joint effusion. Joint spaces appear normal. No erosive change. IMPRESSION: No fracture, dislocation, or joint effusion. No evident arthropathy. Electronically Signed   By: Bretta Bang III M.D.   On: 11/27/2020 14:20   CT Head Wo Contrast  Result Date: 11/27/2020 CLINICAL DATA:  Fall on 11/23/2020. Diffuse left-sided pain. History of dementia. EXAM: CT HEAD WITHOUT CONTRAST CT CERVICAL SPINE WITHOUT CONTRAST TECHNIQUE:  Multidetector CT imaging of the head and cervical spine was performed following the standard protocol without intravenous contrast. Multiplanar CT image reconstructions of the cervical spine were also generated. COMPARISON:  Head CT 09/17/2020. Head and neck CTA 09/17/2020. Head MRI 09/18/2020. FINDINGS: CT HEAD FINDINGS Brain: There is no evidence of an acute infarct, intracranial hemorrhage, mass, midline shift, or extra-axial fluid collection. Hypodensities in the cerebral white matter bilaterally are unchanged and nonspecific but compatible with chronic small vessel ischemic disease which is mild for age. Chronic lacunar infarcts in the right basal ganglia are unchanged. There is mild cerebral atrophy. Vascular: Calcified atherosclerosis at the skull base. Right MCA flow diverter device. Skull: No fracture or suspicious osseous lesion. Sinuses/Orbits: Small volume bubbly fluid in the left maxillary sinus. Persistent small left mastoid effusion. Right cataract extraction. Other: None. CT CERVICAL SPINE FINDINGS Alignment: Chronic mild reversal of the normal cervical lordosis. Unchanged trace retrolisthesis of C2 on C3 and C5 on C6 and grade 1 anterolisthesis of C4 on C5 and C7 on T1. Skull base and vertebrae: No acute fracture or suspicious osseous lesion. Moderate median C1-2 arthropathy. Soft tissues and spinal canal: No prevertebral fluid or swelling. No visible canal hematoma. Disc levels: Cervical disc degeneration most advanced at C5-6 where there is severe disc space narrowing, degenerative endplate sclerosis and spurring, and vacuum disc. Mild disc degeneration at other levels. Severe facet arthrosis on the right at C3-4, on the left at C4-5, and on the left at C7-T1. Mild-to-moderate multilevel neural foraminal stenosis. Upper chest: Clear lung apices. Other: None. IMPRESSION: 1. No evidence of acute intracranial abnormality. 2. Mild chronic small vessel ischemic disease. 3. No acute cervical spine  fracture. Electronically Signed   By: Sebastian Ache M.D.   On: 11/27/2020 14:25   CT Cervical Spine Wo Contrast  Result Date: 11/27/2020 CLINICAL DATA:  Fall on 11/23/2020. Diffuse left-sided pain. History of dementia. EXAM: CT HEAD WITHOUT CONTRAST CT CERVICAL SPINE WITHOUT CONTRAST TECHNIQUE: Multidetector CT imaging of the head and cervical spine was performed following the standard protocol without intravenous  contrast. Multiplanar CT image reconstructions of the cervical spine were also generated. COMPARISON:  Head CT 09/17/2020. Head and neck CTA 09/17/2020. Head MRI 09/18/2020. FINDINGS: CT HEAD FINDINGS Brain: There is no evidence of an acute infarct, intracranial hemorrhage, mass, midline shift, or extra-axial fluid collection. Hypodensities in the cerebral white matter bilaterally are unchanged and nonspecific but compatible with chronic small vessel ischemic disease which is mild for age. Chronic lacunar infarcts in the right basal ganglia are unchanged. There is mild cerebral atrophy. Vascular: Calcified atherosclerosis at the skull base. Right MCA flow diverter device. Skull: No fracture or suspicious osseous lesion. Sinuses/Orbits: Small volume bubbly fluid in the left maxillary sinus. Persistent small left mastoid effusion. Right cataract extraction. Other: None. CT CERVICAL SPINE FINDINGS Alignment: Chronic mild reversal of the normal cervical lordosis. Unchanged trace retrolisthesis of C2 on C3 and C5 on C6 and grade 1 anterolisthesis of C4 on C5 and C7 on T1. Skull base and vertebrae: No acute fracture or suspicious osseous lesion. Moderate median C1-2 arthropathy. Soft tissues and spinal canal: No prevertebral fluid or swelling. No visible canal hematoma. Disc levels: Cervical disc degeneration most advanced at C5-6 where there is severe disc space narrowing, degenerative endplate sclerosis and spurring, and vacuum disc. Mild disc degeneration at other levels. Severe facet arthrosis on the right  at C3-4, on the left at C4-5, and on the left at C7-T1. Mild-to-moderate multilevel neural foraminal stenosis. Upper chest: Clear lung apices. Other: None. IMPRESSION: 1. No evidence of acute intracranial abnormality. 2. Mild chronic small vessel ischemic disease. 3. No acute cervical spine fracture. Electronically Signed   By: Sebastian AcheAllen  Grady M.D.   On: 11/27/2020 14:25   Pelvis Portable  Result Date: 11/28/2020 CLINICAL DATA:  Postop. EXAM: PORTABLE PELVIS 1-2 VIEWS COMPARISON:  Preoperative radiograph yesterday. FINDINGS: Three screws traverse left femoral neck fracture. Slightly improved fracture alignment from prior exam. No periprosthetic lucency. Bones diffusely under mineralized. No additional fracture. IMPRESSION: Three screws traverse left femoral neck fracture. No immediate postoperative complication. Electronically Signed   By: Narda RutherfordMelanie  Sanford M.D.   On: 11/28/2020 18:54   CT Hip Left Wo Contrast  Result Date: 11/27/2020 CLINICAL DATA:  Left hip pain after falling today. Abnormal radiographs with questionable left femoral head fracture. EXAM: CT OF THE LEFT HIP WITHOUT CONTRAST TECHNIQUE: Multidetector CT imaging of the left hip was performed according to the standard protocol. Multiplanar CT image reconstructions were also generated. COMPARISON:  Radiographs 11/27/2020 and 09/17/2020. Pelvic CT 09/17/2020. FINDINGS: Bones/Joint/Cartilage There is a new mildly impacted subcapital fracture of the left femoral neck, best seen on the reformatted images. This demonstrates up to 6 mm of inferior medial displacement on coronal image 33/3. There are underlying degenerative changes of the left hip with joint space narrowing and femoral head osteophytes. No dislocation or fracture of the left hemipelvis. There is a small left hip joint effusion. Ligaments Suboptimally assessed by CT. Muscles and Tendons Unremarkable. Soft tissues Mild periarticular soft tissue swelling without focal hematoma or foreign body.  Iliofemoral atherosclerosis and diffuse sigmoid diverticulosis are noted. IMPRESSION: 1. Acute mildly impacted and displaced fracture of the left femoral neck. 2. Underlying moderate left hip osteoarthritis. 3. No periarticular hematoma. Electronically Signed   By: Carey BullocksWilliam  Veazey M.D.   On: 11/27/2020 15:21   DG Chest Portable 1 View  Result Date: 11/27/2020 CLINICAL DATA:  Left hip fracture. EXAM: PORTABLE CHEST 1 VIEW COMPARISON:  November 18, 2020. FINDINGS: The heart size and mediastinal contours are within normal  limits. No pneumothorax or pleural effusion is noted. Mild bibasilar subsegmental atelectasis is noted. The visualized skeletal structures are unremarkable. IMPRESSION: Mild bibasilar subsegmental atelectasis. Electronically Signed   By: Lupita Raider M.D.   On: 11/27/2020 16:45   DG C-Arm 1-60 Min-No Report  Result Date: 11/28/2020 Fluoroscopy was utilized by the requesting physician.  No radiographic interpretation.   DG Hip Port Unilat With Pelvis 1V Left  Result Date: 11/28/2020 CLINICAL DATA:  Post left hip pinning EXAM: DG HIP (WITH OR WITHOUT PELVIS) 1V PORT LEFT COMPARISON:  None. FINDINGS: Screws within the left femoral neck. No complicating feature. Mild degenerative changes in the left hip. IMPRESSION: Pinning of the left hip.  No visible complicating feature. Electronically Signed   By: Charlett Nose M.D.   On: 11/28/2020 18:04   DG HIP OPERATIVE UNILAT W OR W/O PELVIS LEFT  Result Date: 11/28/2020 CLINICAL DATA:  Left hip pinning. EXAM: OPERATIVE LEFT HIP (WITH PELVIS IF PERFORMED) TECHNIQUE: Fluoroscopic spot image(s) were submitted for interpretation post-operatively. COMPARISON:  Preoperative radiograph yesterday. FINDINGS: Three fluoroscopic spot views obtained of the left hip in the operating room in frontal and lateral projections. Two screws traverse femoral neck fracture. Total fluoroscopy time 43 seconds. IMPRESSION: Procedural fluoroscopy after left hip fracture  pinning. Electronically Signed   By: Narda Rutherford M.D.   On: 11/28/2020 18:52   DG Hip Unilat W or Wo Pelvis 2-3 Views Left  Result Date: 11/27/2020 CLINICAL DATA:  Pain following fall EXAM: DG HIP (WITH OR WITHOUT PELVIS) 2-3V LEFT COMPARISON:  None. FINDINGS: Frontal pelvis as well as frontal and lateral views of the left hip joint were obtained. There is a subtle lucency in the medial aspect of the left femoral head, concerning for potential incomplete fracture in this area. No other findings suggesting potential fracture. No dislocation. There is mild symmetric narrowing of each hip joint. No erosive change. IMPRESSION: Questionable incomplete fracture in the medial aspect of the left femoral head. This appearance warrants further assessment with CT or MR of the left hip to further evaluate. MR would be the optimum study of choice to further evaluate, given ability to demonstrate marrow edema with MR. No other findings suggesting potential fracture. No dislocation. Mild symmetric narrowing each hip joint. Electronically Signed   By: Bretta Bang III M.D.   On: 11/27/2020 14:19    Assessment/Plan: Left femoral neck fracture 1 Day Post-Op s/p Procedure(s): CANNULATED HIP PINNING  Weightbearing: WBAT LLE, up with therapy Insicional and dressing care: Reinforce dressings as needed VTE prophylaxis: Plan for lovenox 40mg  qd for 4 weeks, planning to reach out to neurology this morning regarding this plan since she is on plavix for secondary stroke prophylaxis Pain control: tylenol was d/c'd overnight, patient has morphine 2 mg q 4 hours PRN severe pain Follow - up plan: 2 weeks with Dr. Dispo: pending PT eval  Contact information:   Weekdays 8-5 04-27-2006, PA-C [phone] A fter hours and holidays please check [email or web address] for group call information for [facility]  [phone] 11/29/2020, 8:00 AM

## 2020-11-29 NOTE — Progress Notes (Signed)
Report given to [facility] RN @ [facility] [place].

## 2020-11-29 NOTE — Progress Notes (Signed)
Patient is having some difficulty when swallowing liquids, she starts coughing and states that it feels like she is getting strangled and like something is caught in her throat.   Caprice Red, RN

## 2020-11-29 NOTE — Consult Note (Signed)
[facility] TeleNeurology Consult Services    Date of Service:  11/29/2020    Impression: Acute onset of LUE weakness    Metrics: Last Known Well: 1050 Symptoms: As per HPI.  Patient is not a candidate for thrombolytic due to recent major surgery. Risks of hemorrhage with associated potential morbidity and mortality outweigh benefits of tPA.      Assessment: 80 year old female with acute right MCA stroke  - NIHSS of 17 in the context of AMS on dementia with difficulty following commands. Testing of LLE compromised by recent left hip procedure. Left greater than right sided weakness is noted in addition to mild left facial droop and left visual field cut.  - Overall exam findings are most consistent with an acute right MCA syndrome - CT head: No acute abnormality and no change from 2 days prior ASPECTS is 10. Right pipeline stent is noted. Atrophy and chronic ischemic changes are stable. - CTA neck: Right carotid system with no evidence of dissection, significant stenosis (50% or greater) or occlusion. Left common carotid artery origin is not imaged. Left ICA with no visible dissection, significant stenosis (50% or greater), or occlusion. Vertebral arteries are codominant with no evidence of dissection, stenosis (50% or greater) or occlusion. Multifocal mild stenosis of the right V1 vertebral artery - CTA head: Focal high-grade stenosis of a right proximal M2 MCA branch (series 8, images 78 through 81) with distal opacification, new from prior. Streak artifact limits evaluation for right MCA in-stent stenosis; however, there is opacification proximal and distal to the stent, similar to prior and consistent with stent patency. Redemonstrated flow diverter device within the M1 right MCA. 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 Similar 1-2 mm anteriorly projecting outpouching arising from the anterior communicating artery, which may represent a small aneurysm versus infundibulum with  small associated vessel not seen. - CTP: Approximately 15 mm area penumbra/mismatch in the posterior right MCA territory, which likely is in the territory supplied by the M2 MCA stenosis described above. No core infarct identified. - The patient is not a candidate for thrombolytic due to recent major surgery. Risks of hemorrhage with associated potential morbidity and mortality outweigh benefits of tPA.  - No thrombectomy indicated as there is no LVO.  - Discussed imaging findings with Dr. [name]. The patient should be restarted on Plavix and ASA if Orthopaedics agrees (discussed with Orthopaedics and they agree that benefits of restarting antiplatelet medication outweighs the risks). The patient should also be scheduled for diagnostic cerebral angiogram on Monday to assess for possible placement of a stent in the severely narrowed right MCA branch.      Recommendations: - Diagnostic cerebral angiogram on Monday. NPO after midnight on Sunday (starts at 0001 on Monday; order has been placed) - Restart Plavix - Start ASA - Continue prophylactic-dose Lovenox - Frequent neuro checks - IVF to maintain perfusion - Modified permissive HTN protocol given advanced age. Treat if SBP > 180.  - MRI brain when able.  - Cardiac telemetry - Discussed with Dr. Lowell Guitar       ------------------------------------------------------------------------------   History of Present Illness: 80 year old female with a PMHx of dementia, right MCA aneurysm s/p pipeline stent in 2017 (on Plavix but not ASA at home), COPD, HTN, PSVT, radiculopathy, recently hospitalized for PNA in late March-early April. who at baseline is mobile with a walker at home. She fell at home on April 2, suffering a left hip fracture. She was brought  to the hospital where CT of head and neck at that time showed no acute abnormality, with previously placed right MCA pipeline stent noted. She underwent left cannulated hip pinning by Dr. Dion SaucierLandau  yesterday. She was pleasantly confused on POD #1 follow up exam this morning by Orthopaedics.   Later this morning, she experienced abrupt onset of LUE weakness and increased confusion. This was noted at 11:02 AM, with LKN of 10:50 AM. Code Stroke was called.   Prior to her hip fracture, the patient ambulated with a walker at home and was partially dependent on family for ADLs. Per Chart review, she was generally very closely supervised at home and had only been left alone in a room at her home (family was in another part of the house) for a short period when the fall occurred. Her mRS is 3.   Past Medical History: Past Medical History:  Diagnosis Date  . Anxiety   . Brain aneurysm    pipeline stent 2017   . COPD (chronic obstructive pulmonary disease) (HCC)   . Fatigue   . H/O: hysterectomy   . Headache(784.0)   . History of cardiac catheterization    LHC 10/02: Normal coronary arteries  . History of nuclear stress test    Myoview 10/12: Normal stress nuclear study.  . Hypertension   . Mitral valve prolapse    Echo 8/17:Mild LVH, EF 60-65, normal wall motion, grade 2 diastolic dysfunction, mild AI, mild MR, mild LAE // echo 11/01: EF 60%, mild anterior MVP with trivial MR, PASP 35-40 mmHg, LAE  . Orthostasis   . PSVT (paroxysmal supraventricular tachycardia) (HCC)    palps  . Radiculopathy      Past Surgical History: Past Surgical History:  Procedure Laterality Date  . ABDOMINAL HYSTERECTOMY    . APPENDECTOMY    . BUBBLE STUDY  01/29/2020   Procedure: BUBBLE STUDY;  Surgeon: Parke PoissonAcharya, Gayatri A, MD;  [facility];  Service: Cardiology;;  . CARDIAC CATHETERIZATION  12/95  . CATARACT EXTRACTION    . CHOLECYSTECTOMY    . EMBOLECTOMY Bilateral 01/25/2020   Procedure: Bilateral popliteal EMBOLECTOMY.;  Surgeon: Larina EarthlyEarly, Todd F, MD;  [facility];  Service: Vascular;  Laterality: Bilateral;  . FASCIOTOMY Bilateral 01/26/2020   Procedure: FASCIOTOMY;  Surgeon: Larina EarthlyEarly, Todd F,  MD;  [facility];  Service: Vascular;  Laterality: Bilateral;  . HIP PINNING,CANNULATED Left 11/28/2020   Procedure: CANNULATED HIP PINNING;  Surgeon: Teryl LucyLandau, Joshua, MD;  Location: [facility] ORS;  Service: Orthopedics;  Laterality: Left;  . IR GENERIC HISTORICAL  06/02/2016   IR RADIOLOGIST EVAL & MGMT 06/02/2016 [facility]  . IR GENERIC HISTORICAL  06/29/2016   IR ANGIO INTRA EXTRACRAN SEL INTERNAL CAROTID UNI R MOD SED 06/29/2016 Julieanne CottonSanjeev Deveshwar, MD [facility]  . IR GENERIC HISTORICAL  06/29/2016   IR ANGIOGRAM FOLLOW UP STUDY 06/29/2016 Julieanne CottonSanjeev Deveshwar, MD [facility]  . IR GENERIC HISTORICAL  06/29/2016   IR NEURO EACH ADD'L AFTER BASIC UNI RIGHT (MS) 06/29/2016 Julieanne CottonSanjeev Deveshwar, MD [facility]  . IR GENERIC HISTORICAL  06/29/2016   IR 3D INDEPENDENT WKST 06/29/2016 Julieanne CottonSanjeev Deveshwar, MD [facility]  . IR GENERIC HISTORICAL  06/29/2016   IR TRANSCATH/EMBOLIZ 06/29/2016 Julieanne CottonSanjeev Deveshwar, MD [facility]  . IR GENERIC HISTORICAL  06/29/2016   IR ANGIO VERTEBRAL SEL SUBCLAVIAN INNOMINATE UNI R MOD SED 06/29/2016 Julieanne CottonSanjeev Deveshwar, MD [facility]  . IR GENERIC HISTORICAL  07/28/2016   IR RADIOLOGIST EVAL & MGMT 07/28/2016 [facility]  .  IR GENERIC HISTORICAL  11/12/2016   IR ANGIO INTRA EXTRACRAN SEL COM CAROTID INNOMINATE BILAT MOD SED 11/12/2016 [name], MD [facility]  . IR GENERIC HISTORICAL  11/12/2016   IR ANGIO VERTEBRAL SEL VERTEBRAL UNI L MOD SED 11/12/2016 [name], MD [facility]  . IR GENERIC HISTORICAL  11/12/2016   IR ANGIO VERTEBRAL SEL SUBCLAVIAN INNOMINATE UNI R MOD SED 11/12/2016 [name], MD [facility]  . NASAL SEPTUM SURGERY    . RADIOLOGY WITH ANESTHESIA N/A 06/29/2016   Procedure: EMBOLIZATION;  Surgeon: [name], MD;  Location: [facility] OR;  Service: Radiology;  Laterality: N/A;  . ROTATOR CUFF REPAIR    . TEE WITHOUT CARDIOVERSION N/A 01/29/2020   Procedure: TRANSESOPHAGEAL ECHOCARDIOGRAM (TEE);  Surgeon: Parke Poisson, MD;   Location[facility];  Service: Cardiology;  Laterality: N/A;      Medications:  No current facility-administered medications on file prior to encounter.   Current Outpatient Medications on File Prior to Encounter  Medication Sig Dispense Refill  . acetaminophen (TYLENOL) 500 MG tablet Take 1,000 mg by mouth every 6 (six) hours as needed for moderate pain.    Marland Kitchen ALPRAZolam (XANAX) 0.5 MG tablet Take 1 tablet (0.5 mg total) by mouth 3 (three) times daily as needed for anxiety. (Patient taking differently: Take 0.25-0.5 mg by mouth 3 (three) times daily as needed for anxiety.) 30 tablet 0  . amLODipine (NORVASC) 5 MG tablet TAKE 1 TABLET (5 MG TOTAL) BY MOUTH DAILY. 30 tablet 0  . budesonide-formoterol (SYMBICORT) 80-4.5 MCG/ACT inhaler Inhale 2 puffs into the lungs 2 (two) times daily. 1 each 1  . butalbital-acetaminophen-caffeine (FIORICET) 50-325-40 MG tablet Take 1 tablet by mouth every 8 (eight) hours as needed for headache or migraine. 14 tablet 3  . cholecalciferol (VITAMIN D) 1000 units tablet Take 1,000 Units by mouth daily.    . clopidogrel (PLAVIX) 75 MG tablet Take 1 tablet (75 mg total) by mouth daily. 30 tablet 3  . famotidine (PEPCID) 20 MG tablet Take 20 mg by mouth 2 (two) times daily.    . flecainide (TAMBOCOR) 50 MG tablet Take 1 tablet (50 mg total) by mouth 2 (two) times daily. 180 tablet 1  . FLUoxetine (PROZAC) 20 MG capsule Take 20 mg by mouth 2 (two) times daily.  11  . ibuprofen (ADVIL) 200 MG tablet Take 400 mg by mouth every 6 (six) hours as needed for mild pain.    Marland Kitchen L-Methylfolate-B6-B12 (FOLTX) 1.13-25-2 MG TABS Take 1 tablet by mouth daily.    Marland Kitchen levalbuterol (XOPENEX HFA) 45 MCG/ACT inhaler Inhale 2 puffs into the lungs every 4 (four) hours as needed for wheezing. Use 2 times daily x4 days, then every 4 hours as needed. 1 each 2  . losartan (COZAAR) 25 MG tablet Take 25 mg by mouth daily.    . metoprolol succinate (TOPROL-XL) 25 MG 24 hr tablet TAKE 1 TABLET BY  MOUTH EVERY DAY AT BEDTIME (Patient taking differently: Take 25 mg by mouth at bedtime.) 30 tablet 7  . mirtazapine (REMERON) 7.5 MG tablet Take 7.5 mg by mouth at bedtime.    Marland Kitchen omeprazole (PRILOSEC) 40 MG capsule Take 40 mg by mouth daily.    . Potassium 99 MG TABS Take 99 mg by mouth 2 (two) times daily.    . feeding supplement, ENSURE ENLIVE, (ENSURE ENLIVE) LIQD Take 237 mLs by mouth 2 (two) times daily between meals. (Patient not taking: No sig reported) 237 mL 12  . ferrous sulfate  325 (65 FE) MG tablet Take 1 tablet (325 mg total) by mouth 2 (two) times daily with a meal. 180 tablet 1  . Multiple Vitamin (MULTIVITAMIN WITH MINERALS) TABS tablet Take 1 tablet by mouth daily. (Patient not taking: No sig reported)    . QUEtiapine (SEROQUEL) 25 MG tablet Take 1 tablet (25 mg total) by mouth 2 (two) times daily. (Patient not taking: No sig reported) 60 tablet 5  . vitamin B-12 1000 MCG tablet Take 1 tablet (1,000 mcg total) by mouth daily. (Patient not taking: No sig reported)          Social History: Drug Use: None   Family History:  Reviewed in Epic   ROS: As per HPI    Anticoagulant use:  None at home. On prophylactic Lovenox as inpatient.    Antiplatelet use: Home Plavix was stopped for her hip operation.    Examination:   1A: Level of Consciousness - 1 1B: Ask Month and Age - 0 1C: Blink Eyes & Squeeze Hands - 0 2: Test Horizontal Extraocular Movements - 1 3: Test Visual Fields - 2 4: Test Facial Palsy (Use Grimace if Obtunded) - 1 5A: Test Left Arm Motor Drift - 4 5B: Test Right Arm Motor Drift - 0 6A: Test Left Leg Motor Drift - 4 6B: Test Right Leg Motor Drift - 2 7: Test Limb Ataxia (FNF/Heel-Shin) - 0 8: Test Sensation -  1 9: Test Language/Aphasia - 0 10: Test Dysarthria - Severe Dysarthria: 0 11: Test Extinction/Inattention - Extinction to bilateral simultaneous stimulation 1   NIHSS Score: 17   Pre-Morbid Modified Rankin Scale: 3     Patient/Family was  informed the Neurology Consult would occur via TeleHealth consult by way of interactive audio and video telecommunications and consented to receiving care in this manner.   Patient is being evaluated for possible acute neurologic impairment and high pretest probability of imminent or life-threatening deterioration. I spent total of 45 minutes providing care to this patient, including time for face to face visit via telemedicine, review of medical records, imaging studies and discussion of findings with providers, the patient and/or family.   Electronically signed: Dr. Caryl Pina

## 2020-11-29 NOTE — Progress Notes (Signed)
Asprin suppository delayed. Awaiting arrival from pharmacy. In basket message sent.

## 2020-11-29 NOTE — Significant Event (Signed)
Rapid Response Event Note   Reason for Call :  Called to bedside by MD and Charge RN for concern for Code Stroke. MD confirmed he wanted a Code Stroke. Last known well 1050. Onset of symptoms 1102  Initial Focused Assessment:  Neuro: Alert, oriented to person, time, and place. Difficulty speaking, with a history of dysphagia and dementia. New onset of left sided weakness. Pt unable to grip with left hand and slight facial droop with smile Resp: [place] at 2L. O2 sats upper 90s Cardiac: NSR in the 70s-80s   Interventions:  MD wanting to call a Code Stroke. Tele Neuro cart brought to room by [facility]. CBG obtained (100). Pt transported to CT. Tele Neuro in place and Neurologist ordering for further scans while still in CT.   Plan of Care:  No acute interventions at this time. Further imaging may be required at another time. Neurologist spoke with Ortho PA and confirmed okay to restart aspirin and plavix.    Event Summary:   MD Notified: Dr. Lowell Guitar  Call Time: 1125 Arrival Time: 1127 End Time: 1311  Myra Rude, RN BSN

## 2020-11-29 NOTE — Progress Notes (Addendum)
   11/29/20 1101  Vitals  Temp 98.8 F (37.1 C)  Temp Source Oral  BP (!) 176/79  MAP (mmHg) 103  BP Location Right Arm  BP Method Automatic  ECG Heart Rate 77  Resp 18  Level of Consciousness  Level of Consciousness Alert  MEWS COLOR  MEWS Score Color Green  Oxygen Therapy  SpO2 95 %  O2 Device Nasal Cannula  Pain Assessment  Pain Scale 0-10  Pain Score 0  Alert to family, appearing more drowsy, but following commands.  RR even and unlabored. Attempted to get patient back to bed, patient appeared to be slumped over and unable to do so.. Vital signs obtained. Chair reclined back.Made MD Shaune Spittle aware of mentation and last known well time being 10:50. With symptoms presenting now at 11:02 MD Elijah Birk currently at bedside. Charge RN also made aware of patient condition. Patient now resting in the chair, family at bedside. Arouses to stimuli. Will continue to monitor.

## 2020-11-29 NOTE — Progress Notes (Signed)
Coughing noted with thin liquids. Made MD Lowell Guitar aware of speech consult needed to be placed.

## 2020-11-30 DIAGNOSIS — Z515 Encounter for palliative care: Secondary | ICD-10-CM

## 2020-11-30 DIAGNOSIS — I63311 Cerebral infarction due to thrombosis of right middle cerebral artery: Secondary | ICD-10-CM | POA: Diagnosis not present

## 2020-11-30 DIAGNOSIS — Z7189 Other specified counseling: Secondary | ICD-10-CM

## 2020-11-30 DIAGNOSIS — Z8673 Personal history of transient ischemic attack (TIA), and cerebral infarction without residual deficits: Secondary | ICD-10-CM

## 2020-11-30 DIAGNOSIS — Z66 Do not resuscitate: Secondary | ICD-10-CM | POA: Diagnosis not present

## 2020-11-30 DIAGNOSIS — S72002D Fracture of unspecified part of neck of left femur, subsequent encounter for closed fracture with routine healing: Secondary | ICD-10-CM | POA: Diagnosis not present

## 2020-11-30 DIAGNOSIS — I10 Essential (primary) hypertension: Secondary | ICD-10-CM | POA: Diagnosis not present

## 2020-11-30 DIAGNOSIS — N1831 Chronic kidney disease, stage 3a: Secondary | ICD-10-CM | POA: Diagnosis not present

## 2020-11-30 LAB — BASIC METABOLIC PANEL
Anion gap: 7 (ref 5–15)
Anion gap: 6 (ref 5–15)
BUN: 11 mg/dL (ref 8–23)
BUN: 9 mg/dL (ref 8–23)
CO2: 29 mmol/L (ref 22–32)
CO2: 27 mmol/L (ref 22–32)
Calcium: 9.2 mg/dL (ref 8.9–10.3)
Calcium: 8.8 mg/dL — ABNORMAL LOW (ref 8.9–10.3)
Chloride: 99 mmol/L (ref 98–111)
Chloride: 101 mmol/L (ref 98–111)
Creatinine, Ser: 0.74 mg/dL (ref 0.44–1.00)
Creatinine, Ser: 0.65 mg/dL (ref 0.44–1.00)
GFR, Estimated: >60 mL/min (ref >60)
GFR, Estimated: >60 mL/min (ref >60)
Glucose, Bld: 114 mg/dL — ABNORMAL HIGH (ref 70–99)
Glucose, Bld: 138 mg/dL — ABNORMAL HIGH (ref 70–99)
Potassium: 3.2 mmol/L — ABNORMAL LOW (ref 3.5–5.1)
Potassium: 2.7 mmol/L — CL (ref 3.5–5.1)
Sodium: 135 mmol/L (ref 135–145)
Sodium: 134 mmol/L — ABNORMAL LOW (ref 135–145)

## 2020-11-30 LAB — CBC
HCT: 30.9 % — ABNORMAL LOW (ref 36.0–46.0)
Hemoglobin: 10.3 g/dL — ABNORMAL LOW (ref 12.0–15.0)
MCH: 30.7 pg (ref 26.0–34.0)
MCHC: 33.3 g/dL (ref 30.0–36.0)
MCV: 92 fL (ref 80.0–100.0)
Platelets: 389 10*3/uL (ref 150–400)
RBC: 3.36 MIL/uL — ABNORMAL LOW (ref 3.87–5.11)
RDW: 13.7 % (ref 11.5–15.5)
WBC: 13.1 10*3/uL — ABNORMAL HIGH (ref 4.0–10.5)
nRBC: 0 % (ref 0.0–0.2)

## 2020-11-30 LAB — GLUCOSE, CAPILLARY
Glucose-Capillary: 118 mg/dL — ABNORMAL HIGH (ref 70–99)
Glucose-Capillary: 120 mg/dL — ABNORMAL HIGH (ref 70–99)

## 2020-11-30 LAB — CBC WITH DIFFERENTIAL/PLATELET
Abs Immature Granulocytes: 0.06 10*3/uL (ref 0.00–0.07)
Basophils Absolute: 0.1 10*3/uL (ref 0.0–0.1)
Basophils Relative: 0 %
Eosinophils Absolute: 0.1 10*3/uL (ref 0.0–0.5)
Eosinophils Relative: 1 %
HCT: 28.8 % — ABNORMAL LOW (ref 36.0–46.0)
Hemoglobin: 9.7 g/dL — ABNORMAL LOW (ref 12.0–15.0)
Immature Granulocytes: 0 %
Lymphocytes Relative: 11 %
Lymphs Abs: 1.4 10*3/uL (ref 0.7–4.0)
MCH: 30.7 pg (ref 26.0–34.0)
MCHC: 33.7 g/dL (ref 30.0–36.0)
MCV: 91.1 fL (ref 80.0–100.0)
Monocytes Absolute: 1.3 10*3/uL — ABNORMAL HIGH (ref 0.1–1.0)
Monocytes Relative: 10 %
Neutro Abs: 10.4 10*3/uL — ABNORMAL HIGH (ref 1.7–7.7)
Neutrophils Relative %: 78 %
Platelets: 377 10*3/uL (ref 150–400)
RBC: 3.16 MIL/uL — ABNORMAL LOW (ref 3.87–5.11)
RDW: 13.3 % (ref 11.5–15.5)
WBC: 13.4 10*3/uL — ABNORMAL HIGH (ref 4.0–10.5)
nRBC: 0 % (ref 0.0–0.2)

## 2020-11-30 LAB — HEMOGLOBIN A1C
Hgb A1c MFr Bld: 5.4 % (ref 4.8–5.6)
Mean Plasma Glucose: 108.28 mg/dL

## 2020-11-30 LAB — LIPID PANEL
Cholesterol: 131 mg/dL (ref 0–200)
HDL: 46 mg/dL (ref >40)
LDL Cholesterol: 69 mg/dL (ref 0–99)
Total CHOL/HDL Ratio: 2.8 RATIO
Triglycerides: 79 mg/dL (ref <150)
VLDL: 16 mg/dL (ref 0–40)

## 2020-11-30 LAB — MAGNESIUM: Magnesium: 1.2 mg/dL — ABNORMAL LOW (ref 1.7–2.4)

## 2020-11-30 MED ORDER — RESOURCE THICKENUP CLEAR PO POWD
ORAL | Status: DC | PRN
  Filled 2020-11-30: qty 125

## 2020-11-30 MED ORDER — BUTALBITAL-APAP-CAFFEINE 50-325-40 MG PO TABS
1.0000 | ORAL_TABLET | Freq: Three times a day (TID) | ORAL | Status: DC | PRN
  Administered 2020-11-30 – 2020-12-03 (×2): 1 via ORAL
  Filled 2020-11-30 (×2): qty 1

## 2020-11-30 NOTE — Progress Notes (Signed)
Patient was a transfer from [facility] s/p Fall and had hip fracture and  S/p left hip surgeryl  Had a stroke  This am and trasnfer to [facility] Neuro made aware Pt c/o aspiration Npo .attempt for NGT x 2 unsuccessful ND made aware , Pt alert awake and oriented to person month and situation .but states  He is at home. Pt reoriented to time and place .denied pain telemetry in use . Will continue to assess pt.

## 2020-11-30 NOTE — Plan of Care (Signed)
  Problem: Education: Goal: Knowledge of General Education information will improve Description: Including pain rating scale, medication(s)/side effects and non-pharmacologic comfort measures Outcome: Progressing   Problem: Health Behavior/Discharge Planning: Goal: Ability to manage health-related needs will improve Outcome: Progressing   Problem: Clinical Measurements: Goal: Ability to maintain clinical measurements within normal limits will improve Outcome: Progressing Goal: Will remain free from infection Outcome: Progressing Goal: Diagnostic test results will improve Outcome: Progressing Goal: Respiratory complications will improve Outcome: Progressing Goal: Cardiovascular complication will be avoided Outcome: Progressing   Problem: Activity: Goal: Risk for activity intolerance will decrease Outcome: Progressing   Problem: Nutrition: Goal: Adequate nutrition will be maintained Outcome: Progressing   Problem: Coping: Goal: Level of anxiety will decrease Outcome: Progressing   Problem: Elimination: Goal: Will not experience complications related to bowel motility Outcome: Progressing Goal: Will not experience complications related to urinary retention Outcome: Progressing   Problem: Pain Managment: Goal: General experience of comfort will improve Outcome: Progressing   Problem: Safety: Goal: Ability to remain free from injury will improve Outcome: Progressing   Problem: Skin Integrity: Goal: Risk for impaired skin integrity will decrease Outcome: Progressing   Problem: Education: Goal: Knowledge of disease or condition will improve Outcome: Progressing Goal: Knowledge of secondary prevention will improve Outcome: Progressing Goal: Knowledge of patient specific risk factors addressed and post discharge goals established will improve Outcome: Progressing Goal: Individualized Educational Video(s) Outcome: Progressing   Problem: Coping: Goal: Will verbalize  positive feelings about self Outcome: Progressing   Problem: Self-Care: Goal: Ability to participate in self-care as condition permits will improve Outcome: Progressing Goal: Verbalization of feelings and concerns over difficulty with self-care will improve Outcome: Progressing Goal: Ability to communicate needs accurately will improve Outcome: Progressing   Problem: Nutrition: Goal: Risk of aspiration will decrease Outcome: Progressing Goal: Dietary intake will improve Outcome: Progressing   Problem: Ischemic Stroke/TIA Tissue Perfusion: Goal: Complications of ischemic stroke/TIA will be minimized Outcome: Progressing

## 2020-11-30 NOTE — Progress Notes (Addendum)
STROKE TEAM PROGRESS NOTE    Interval History   No acute events overnight, daughter [name] at bedside, patient and family educated about stroke/stroke prevention.  She has prior history of right MCA pipeline stent with known restenosis and presents with recurrent right MCA branch infarct with underlying significant restenosis in the stent.  She is scheduled for diagnostic catheter angiogram on Monday with Dr. [name].  Her daughter is at the bedside.  Pertinent Lab Work and Imaging    11/29/20 CT Head WO IV Contrast 1. No acute abnormality and no change from 2 days prior 2. ASPECTS is 10 3. Atrophy and chronic ischemic changes, stable from the prior Study.  11/29/20 CT Angio Head and Neck W WO IV Contrast 1. 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 2. Approximately 15 mm area penumbra/mismatch in the posterior right MCA territory, which likely is in the territory supplied by the M2 MCA stenosis described above. No core infarct identified. 3. Streak artifact limits evaluation for right MCA in-stent stenosis; however, there is opacification proximal and distal to the stent, similar to prior and consistent with stent patency. 4. Redemonstrated flow diverter device within the M1 right MCA. 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 5. Similar 1-2 mm anteriorly projecting outpouching arising from the anterior communicating artery, which may represent a small aneurysm versus infundibulum with small associated vessel not seen.  11/29/20 MRI Brain WO IV Contrast 1. Area of restricted diffusion involving the posterior aspect of the right insula and right frontoparietal region, corresponding to area of increased T-max on prior CT perfusion, consistent with acute/subacute infarct. 2. Remote infarct in the right basal ganglia region. 3. Moderate chronic small vessel ischemia. 4. Left maxillary  sinus disease with small fluid level. Correlate for acute sinusitis. 5. Bilateral mastoid effusion.  11/19/20 Echocardiogram Complete  1. Left ventricular ejection fraction, by estimation, is 65 to 70%. The  left ventricle has normal function. The left ventricle has no regional  wall motion abnormalities. Left ventricular diastolic parameters are  indeterminate.  2. Right ventricular systolic function is normal. The right ventricular  size is normal. There is moderately elevated pulmonary artery systolic  pressure.  3. A small pericardial effusion is present. The pericardial effusion is  circumferential. There is no evidence of cardiac tamponade.  4. The mitral valve is normal in structure. Trivial mitral valve  regurgitation. No evidence of mitral stenosis.  5. The aortic valve is normal in structure. Aortic valve regurgitation is  trivial. No aortic stenosis is present.  6. The inferior vena cava is dilated in size with >50% respiratory  variability, suggesting right atrial pressure of 8 mmHg.   Physical Examination   Constitutional: Frail elderly Caucasian lady not in distress Cardiovascular: Normal RR Respiratory: No increased WOB   Mental status: AAOx4, follows simple commands  Speech: Fluent w/repetition and naming intact  Cranial nerves: EOMI, VFF, Left lower facial Droop, Tongue midline Motor: Normal bulk and tone. RUE 5/5 RLE 5/5 left hemiparesis with LUE 3/5 LLE 3/5 ( exam to LLE limited by pain in the setting of recent hip surgery) Drift noted in LUE.  Sensory: Difficult to assess, sensation seems to be intact to light touch bilaterally  Coordination: FNF intact  Gait: Deferred    NIHSS:  1a Level of Conscious: 0  1b LOC Questions: 0 1c LOC Commands: 1 2 Best Gaze: 0 3 Visual: 0 4  Facial Palsy: 1 5a Motor Arm - left: 1 5b Motor Arm - Right: 0 6a Motor Leg - Left: 0 6b Motor Leg - Right: 0 7 Limb Ataxia: 0 8 Sensory: 0 9 Best Language: 0 10 Dysarthria:  1 11 Extinct. and Inatten: 1  TOTAL: 5   Assessment and Plan   [name] is a 80 y.o. female w/pmh of dementia, anxiety, depression s/p R MCA stent in 2017 complicated by CVA, PVD s/p embolectomy for popliteal occlusions,CKD 3, cerebral aneurysm, COPD, GERD, paroxysmal SVT, orthostatic hypotension, mitral valve prolapse, recent hospitalization from 3/28-4/1 for CAP, fall on 4/2 with resulting left femoral neck fracture for which she underwent left cannulated hip pinning on 4/7 who presents with a R MCA stroke.   #R MCA Stroke  Patient was initially admitted on 4/7, presenting with knee pain following a fall on April 2nd, for which she underwent surgery w/left cannulated hip pinning that was done on day of admission. On 4/8 the day following surgery she developed left sided weakness w/LKW 1050, sx onset 1102. A code stroke was called. CTH w/no acute abnormality. CTA Head and Neck pertinent for focal high grade stenosis of a right proximal M2 MCA branch with distal opacification, new from prior. Streak artifact limits evaluation for right MCA in-stent stenosis; however, there is opacification proximal and distal to the stent, similar to prior and consistent with stent patency. Echo done 11/19/20 w/EF 65 to 70 %, LA normal in size, no PFO.  Stroke labs are currently pending. In regards to her stroke etiology, hypoperfusion in the setting of known RMCA stenosis was considered however per review of blood pressure trends she did not become hypotensive at the time of surgery. More than likely her R MCA stroke is in the setting of new right M2 MCA high grade stenosis.  - Continue DAPT for secondary stroke prevention purposes  - Will initiate statin pending lipid panel  - DSA Monday w/Dr. [name] - Follow up stroke labs   #Hypertension She has a history of HTN and takes Amlodipine 5 mg QD + Losartan 25 mg QD at home. Given stenosis are recommending permissive HTN up to 180. SBP in the past 24  hours has trended 130-180 range, at goal.  - Do not resume home anti hypertensives to allow for permissive HTN   #Hyperlipidemia From a stroke prevention stand point, the LDL goal is < 70. Lipid panel pending.   Hospital day # 3  Stark Jock, NP  [facility] Nurse Practitioner Patient seen and discussed with attending physician Dr. Pearlean Brownie  I have personally obtained history,examined this patient, reviewed notes, independently viewed imaging studies, participated in medical decision making and plan of care.ROS completed by me personally and pertinent positives fully documented  I have made any additions or clarifications directly to the above note. Agree with note above.  She has presented with right MCA branch infarct due to symptomatic right MCA stent restenosis.  Recommend dual antiplatelet therapy and aggressive risk factor modification.  Diagnostic catheter angiogram next week with Dr. [name] decide if patient is a candidate for repeat MCA angioplasty.  Long discussion with patient and daughter at the bedside and answered questions.  Delia Heady, MD Medical Director [facility] Pager: [phone] 11/30/2020 6:08 PM   To contact Stroke Continuity provider, please refer to [email or web address]. After hours, contact General Neurology

## 2020-11-30 NOTE — Plan of Care (Signed)
  Problem: Education: Goal: Knowledge of patient specific risk factors addressed and post discharge goals established will improve Outcome: Progressing   Problem: Education: Goal: Knowledge of secondary prevention will improve Outcome: Progressing   Problem: Education: Goal: Knowledge of disease or condition will improve Outcome: Progressing   

## 2020-11-30 NOTE — Consult Note (Signed)
Palliative Medicine Inpatient Consult Note  Reason for consult:  "Goals of Care "goals of care, stroke, failed SLP eval, NPO, aspiration pneumonia"  HPI:  Per intake H&P --> 80 year old female with past medical history of dementia, anxiety, depression,s/p R MCA stent in 1779 complicated by CVA, PVD s/p embolectomy for popliteal occlusions,CKD 3, cerebral aneurysm, COPD, GERD, paroxysmal SVT, orthostatic hypotension, mitral valve prolapse, recent hospitalization from 3/28-4/1 for CAP who presented to [facility] for evaluation of knee pain following a fall on April 2.She was found to have a left hip fracture and is now status post surgery with ortho on 4/7. Her hospitalization was complicated by delirium with left upper extremity weakness on 4/8 and code stroke was called. She was transferred to [facility] for diagnostic cerebral angiogram on Monday to assess for possible placement of stent in severely narrowed right MCA branchwith Dr. Estanislado Pandy on Monday 4/11.SLP evaluation recommending dysphagia 1 diet.  Palliative care has been asked to get involved to aid in additional goals of care conversations.   Clinical Assessment/Goals of Care:  *Please note that this is a verbal dictation therefore any spelling or grammatical errors are due to the "[place]" system interpretation.  I have reviewed medical records including EPIC notes, labs and imaging, received report from bedside RN, assessed the patient who was pleasantly delirious at the time of assessment thinking that she saw her mother.    I met with Barbara Hill to further discuss diagnosis prognosis, GOC, EOL wishes, disposition and options.   I introduced Palliative Medicine as specialized medical care for people living with serious illness. It focuses on providing relief from the symptoms and stress of a serious illness. The goal is to improve quality of life for both the patient and the family.  Barbara Hill shares with me that her mother  is from [place], [place]. She is a widow and has both a son and a daughter. She also has three grandchildren. 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 She enjoys spending time with her family. She is a woman of faith and practices within the [employer] [facility] [employer] [facility] [employer] denomination.  Prior to about two weeks ago, Barbara Hill was doing fairly in her home. She was able to perform bADL's with some assistance and her daughter helped with meal preparation.   Barbara Hill has declined since June of 2021 when she was afflicted by BLE DVT's. She later got PNA and most recently fell causing a hip fx. Her daughter shares that she has really been saddened since her husband died in 21-Jul-2023. Barbara Hill shares that he was on [facility] hosp[facility] for six months prior to passing and that this was a good experience.   A detailed discussion was had today regarding advanced directives.  Concepts specific to code status, artifical feeding and hydration, continued IV antibiotics and rehospitalization was had.  A MOST form was completed as below:   Cardiopulmonary Resuscitation: Do Not Attempt Resuscitation (DNR/No CPR)  Medical Interventions: Limited Additional Interventions: Use medical treatment, IV fluids and cardiac monitoring as indicated, DO NOT USE intubation or mechanical ventilation. May consider use of less invasive airway support such as BiPAP or CPAP. Also provide comfort measures. Transfer to the hospital if indicated. Avoid intensive care.   Antibiotics: Antibiotics if indicated  IV Fluids: IV fluids if indicated  Feeding Tube: Feeding tube for a defined trial period   Reviewed that health care professionals commonly rely on feeding tubes to supply nutrition to these severely demented patients. However, various studies have not shown  use of feeding tubes to be effective in preventing malnutrition. Furthermore, they have not been demonstrated to prevent the occurrence or increase the healing of pressure sores, prevent  aspiration pneumonia, provide comfort, improve functional status, or extend life. High complication rates, increased use of restraints, and other adverse effects further increase the burden of feeding tubes in severely demented patients.  The difference between a aggressive medical intervention path  and a palliative comfort care path for this patient at this time was had. We reviewed to allow for three days to see if improvements can be made. If none are made then to consider hospice care in the home with [facility].   Discussed the importance of continued conversation with family and their  medical providers regarding overall plan of care and treatment options, ensuring decisions are within the context of the patients values and GOCs.  Decision Maker: Barbara Hill (daughter) [phone]  SUMMARY OF RECOMMENDATIONS   DNAR/DNI  MOST Completed, paper copy placed onto the chart electric copy can be found in [facility]  DNR Form Completed, paper copy placed onto the chart electric copy can be found in [facility]  Time trial of therapies - 72 hours  Ongoing GOC in the oncoming days - if patient declines her daughter, Barbara Hill would be interested in transitioning [place] home with [facility] hosp[facility]  Code Status/Advance Care Planning: DNAR/DNI   Palliative Prophylaxis:   Oral Care, Mobility, Delirium precautions  Additional Recommendations (Limitations, Scope, Preferences):  Continue current scope of care  Psycho-social/Spiritual:   Desire for further Chaplaincy support: Yes - [facility]  Additional Recommendations: Education on chronic diseases and debility declines   Prognosis: Poor in the setting of advanced age, frailty, and FTT.   Discharge Planning:   Vitals:   11/30/20 1143 11/30/20 1150  BP: (!) 134/58   Pulse: 79   Resp: 14   Temp: 98.2 F (36.8 C)   SpO2: 95% 95%    Intake/Output Summary (Last 24 hours) at 11/30/2020 1423 Last data filed at 11/30/2020 0700 Gross per 24 hour   Intake 850 ml  Output 650 ml  Net 200 ml   Last Weight  Most recent update: 11/27/2020  2:19 PM   Weight  42.3 kg (93 lb 4.8 oz)           Gen:  Frail elderly F in NAD HEENT: moist mucous membranes CV: Regular rate and rhythm  PULM: On RA ABD: soft/nontender  EXT: No edema  Neuro: Alert - disoriented  PPS: 20%   This conversation/these recommendations were discussed with patient primary care team, Dr. Karleen Hampshire  Time In: 1420 Time Out: 1530 Total Time: 70 Greater than 50%  of this time was spent counseling and coordinating care related to the above assessment and plan.  [place] Team Team Cell Phone: [phone] Please utilize secure chat with additional questions, if there is no response within 30 minutes please call the above phone number  Palliative Medicine Team providers are available by phone from 7am to 7pm daily and can be reached through the team cell phone.  Should this patient require assistance outside of these hours, please call the patient's attending physician.

## 2020-11-30 NOTE — Progress Notes (Signed)
PROGRESS NOTE    Barbara Hill  ZOX:[id number] DOB: 01/14/1941 DOA: 11/27/2020 PCP: Richmond Campbell., PA-C    Chief Complaint  Patient presents with  . Fall    Brief Narrative:  80 year old female with past medical history of dementia, anxiety, depression, s/p R MCA stent in 2017 complicated by CVA, PVD s/p embolectomy for popliteal occlusions, CKD 3, cerebral aneurysm, COPD, GERD, paroxysmal SVT, orthostatic hypotension, mitral valve prolapse, recent hospitalization from 3/28-4/1 for CAP who presented to [facility] for evaluation of knee pain following a fall on April 2. She was found to have a left hip fracture and is now status post surgery with ortho on 4/7.  Her hospitalization was complicated by delirium with left upper extremity weakness on 4/8 and code stroke was called.  She was transferred to [facility] for diagnostic cerebral angiogram on Monday to assess for possible placement of stent in severely narrowed right MCA branch with Dr. [name] on Monday 4/11.  SLP evaluation recommending dysphagia 1 diet.  Assessment & Plan:   Principal Problem:   Hip fracture (HCC) Active Problems:   Paroxysmal SVT (supraventricular tachycardia) (HCC)   History of CVA (cerebrovascular accident)   CKD (chronic kidney disease), stage III (HCC)   Hypertension   Prolonged QT interval   Acutely mildly impacted and displaced left femoral neck fracture s/p mechanical fall CT of the left hip with mildly impacted and displaced fracture of the left femoral neck S/p left cannulated hip pinning on 11/28/2020 PT evaluation recommending SNF versus home health PT  Weightbearing as tolerated on the left lower extremity.  Acute metabolic encephalopathy probably secondary to stroke and recent surgery. As per the family patient at baseline is independent with ADLs. Minimize sedating medications and pain medications.     Code stroke called on 11/29/2020 for altered mental status and left upper extremity  weakness CT of the head is negative without acute abnormality CT angiogram of the head and neck showed focal high-grade stenosis of the right proximal M2 MCA branch with distal opacification new from prior approximately 15 mm area of penumbra/mismatch in the posterior right MCA territory, likely in the territory supplied by the M2 MCA stenosis.  Neurology consulted recommended aspirin and Plavix and transferred to the [facility] for neuro interventional radiology evaluation.  Hemoglobin A1c is pending. LDL is pending.  Last echocardiogram in March 2022 showed left ventricular ejection fraction of 65 to 70%. Therapy evaluations done, recommending SNF versus home health PT. And was started on aspirin 81 mg and Plavix 75 mg daily. Modified permissive hypertensive protocol given advanced age.    Dysphagia in the setting of altered mental status and stroke NG tube was attempted 2-3 times without success.  SLP evaluation today recommended dysphagia 1 diet.   History of stage IIIa CKD Creatinine appears to be better than baseline now.   Hypomagnesemia Repeat magnesium levels in the morning.   Leukocytosis Unclear etiology repeat CBC today.    Anemia of chronic disease Patient's baseline hemoglobin around 10. Recheck hemoglobin in the morning.   Essential hypertension Modified permissive hypertension protocol given advanced age As needed hydralazine to keep systolic blood pressure less than 180 mmHg   DVT prophylaxis: Lovenox code Status: Full code Family Communication: None at bedside Disposition:   Status is: Inpatient  Remains inpatient appropriate because:Ongoing diagnostic testing needed not appropriate for outpatient work up, Unsafe d/c plan and IV treatments appropriate due to intensity of illness or inability to take PO   Dispo: The patient  is from: Home              Anticipated d/c is to: Pending              Patient currently is not medically stable to d/c.    Difficult to place patient No       Consultants:   Neurology  Orthopedics  Procedures: None Antimicrobials: None  Subjective: Patient alert not in any kind of distress oriented to person only. Objective: Vitals:   11/29/20 2314 11/30/20 0114 11/30/20 0324 11/30/20 0842  BP: (!) 159/82 (!) 180/78 (!) 185/72 (!) 186/69  Pulse: 76 75 72 85  Resp: 18 18 18 18   Temp: 98.8 F (37.1 C) 98.4 F (36.9 C) 98.7 F (37.1 C) 98.6 F (37 C)  TempSrc: Oral Oral Oral Oral  SpO2: 98%  100% 100%  Weight:      Height:        Intake/Output Summary (Last 24 hours) at 11/30/2020 1112 Last data filed at 11/30/2020 0700 Gross per 24 hour  Intake 850 ml  Output 650 ml  Net 200 ml   Filed Weights   11/27/20 1419  Weight: 42.3 kg    Examination:  General exam: Appears calm and comfortable  Respiratory system: Clear to auscultation. Respiratory effort normal. Cardiovascular system: S1 & S2 heard, RRR. No JVD,. No pedal edema. Gastrointestinal system: Abdomen is nondistended, soft and nontender. Normal bowel sounds heard. Central nervous system: Alert and following simple commands, moving all extremities Extremities: No pedal edema Skin: No rashes seen  psychiatry: Mood & affect appropriate.     Data Reviewed: I have personally reviewed following labs and imaging studies  CBC: Recent Labs  Lab 11/27/20 1313 11/28/20 0826 11/29/20 0409  WBC 17.0* 18.6* 18.0*  HGB 12.4 11.6* 10.2*  HCT 37.3 35.0* 32.1*  MCV 89.7 90.7 94.4  PLT 617* 528* 459*    Basic Metabolic Panel: Recent Labs  Lab 11/27/20 1313 11/28/20 0826 11/29/20 0409  NA 135 138 134*  K 3.5 3.5 4.2  CL 98 101 99  CO2 23 25 24   GLUCOSE 132* 107* 98  BUN 18 20 20   CREATININE 0.98 0.87 0.85  CALCIUM 9.7 9.6 9.3  MG  --  1.5*  --     GFR: Estimated Creatinine Clearance: 36.4 mL/min (by C-G formula based on SCr of 0.85 mg/dL).  Liver Function Tests: No results for input(s): AST, ALT, ALKPHOS, BILITOT,  PROT, ALBUMIN in the last 168 hours.  CBG: Recent Labs  Lab 11/29/20 1133 11/30/20 0609  GLUCAP 100* 118*     Recent Results (from the past 240 hour(s))  Resp Panel by RT-PCR (Flu A&B, Covid) Nasopharyngeal Swab     Status: None   Collection Time: 11/27/20  3:39 PM   Specimen: Nasopharyngeal Swab; Nasopharyngeal(NP) swabs in vial transport medium  Result Value Ref Range Status   SARS Coronavirus 2 by RT PCR NEGATIVE NEGATIVE Final    Comment: (NOTE) SARS-CoV-2 target nucleic acids are NOT DETECTED.  The SARS-CoV-2 RNA is generally detectable in upper respiratory specimens during the acute phase of infection. The lowest concentration of SARS-CoV-2 viral copies this assay can detect is 138 copies/mL. A negative result does not preclude SARS-Cov-2 infection and should not be used as the sole basis for treatment or other patient management decisions. A negative result may occur with  improper specimen collection/handling, submission of specimen other than nasopharyngeal swab, presence of viral mutation(s) within the areas targeted by this assay, and inadequate  number of viral copies(<138 copies/mL). A negative result must be combined with clinical observations, patient history, and epidemiological information. The expected result is Negative.  Fact Sheet for Patients:  BloggerCourse.com  Fact Sheet for Healthcare Providers:  SeriousBroker.it  This test is no t yet approved or cleared by the [place] FDA and  has been authorized for detection and/or diagnosis of SARS-CoV-2 by FDA under an Emergency Use Authorization (EUA). This EUA will remain  in effect (meaning this test can be used) for the duration of the COVID-19 declaration under Section 564(b)(1) of the Act, 21 U.S.C.section 360bbb-3(b)(1), unless the authorization is terminated  or revoked sooner.       Influenza A by PCR NEGATIVE NEGATIVE Final   Influenza B  by PCR NEGATIVE NEGATIVE Final    Comment: (NOTE) The Xpert Xpress SARS-CoV-2/FLU/RSV plus assay is intended as an aid in the diagnosis of influenza from Nasopharyngeal swab specimens and should not be used as a sole basis for treatment. Nasal washings and aspirates are unacceptable for Xpert Xpress SARS-CoV-2/FLU/RSV testing.  Fact Sheet for Patients: BloggerCourse.com  Fact Sheet for Healthcare Providers: SeriousBroker.it  This test is not yet approved or cleared by the [place] FDA and has been authorized for detection and/or diagnosis of SARS-CoV-2 by FDA under an Emergency Use Authorization (EUA). This EUA will remain in effect (meaning this test can be used) for the duration of the COVID-19 declaration under Section 564(b)(1) of the Act, 21 U.S.C. section 360bbb-3(b)(1), unless the authorization is terminated or revoked.  Performed at [facility], [address], [place], [place] [address]   Surgical PCR screen     Status: None   Collection Time: 11/28/20 11:58 AM   Specimen: Nasal Mucosa; Nasal Swab  Result Value Ref Range Status   MRSA, PCR NEGATIVE NEGATIVE Final   Staphylococcus aureus NEGATIVE NEGATIVE Final    Comment: (NOTE) The Xpert SA Assay (FDA approved for NASAL specimens in patients 28 years of age and older), is one component of a comprehensive surveillance program. It is not intended to diagnose infection nor to guide or monitor treatment. Performed at [facility], [address] [place], [place] [address]          Radiology Studies: DG Abd 1 View  Result Date: 11/29/2020 CLINICAL DATA:  Nasogastric tube placement. EXAM: ABDOMEN - 1 VIEW COMPARISON:  None. FINDINGS: Nasogastric tube is seen entering the right mainstem bronchus with distal tip in the right lower lobe. Immediate withdrawal is recommended. IMPRESSION: Nasogastric tube seen in right mainstem bronchus  with distal tip in right lower lobe. Immediate removal is recommended. Electronically Signed   By: Lupita Raider M.D.   On: 11/29/2020 14:24   MR BRAIN WO CONTRAST  Result Date: 11/29/2020 CLINICAL DATA:  Neuro deficit, acute, stroke suspected. Altered mental status. EXAM: MRI HEAD WITHOUT CONTRAST TECHNIQUE: Multiplanar, multiecho pulse sequences of the brain and surrounding structures were obtained without intravenous contrast. COMPARISON:  Head CT November 29, 2020 FINDINGS: Brain: Area of restricted diffusion involving the posterior aspect of the right insula and right frontoparietal region, consistent with acute/subacute infarct, corresponding to area of increased T-max on prior CT perfusion. No hemorrhage, hydrocephalus, extra-axial collection or mass lesion. Remote infarct in the right basal ganglia region. Scattered and confluent foci of T2 hyperintensity are seen within the white matter of cerebral hemispheres, nonspecific, most likely related to chronic small vessel ischemia. Susceptibility artifact in the anterior right sylvian fissure related to  right M1/MCA stent. Vascular: Normal flow voids. Skull and upper cervical spine: Normal marrow signal. Sinuses/Orbits: Mild mucosal thickening of the left maxillary sinus with small fluid level. Right lens surgery. Other: Bilateral mastoid effusion. IMPRESSION: 1. Area of restricted diffusion involving the posterior aspect of the right insula and right frontoparietal region, corresponding to area of increased T-max on prior CT perfusion, consistent with acute/subacute infarct. 2. Remote infarct in the right basal ganglia region. 3. Moderate chronic small vessel ischemia. 4. Left maxillary sinus disease with small fluid level. Correlate for acute sinusitis. 5. Bilateral mastoid effusion. Electronically Signed   By: Baldemar Lenis M.D.   On: 11/29/2020 16:20   Pelvis Portable  Result Date: 11/28/2020 CLINICAL DATA:  Postop. EXAM: PORTABLE PELVIS  1-2 VIEWS COMPARISON:  Preoperative radiograph yesterday. FINDINGS: Three screws traverse left femoral neck fracture. Slightly improved fracture alignment from prior exam. No periprosthetic lucency. Bones diffusely under mineralized. No additional fracture. IMPRESSION: Three screws traverse left femoral neck fracture. No immediate postoperative complication. Electronically Signed   By: Narda Rutherford M.D.   On: 11/28/2020 18:54   CT CEREBRAL PERFUSION W CONTRAST  Result Date: 11/29/2020 CLINICAL DATA:  Acute stroke suspected. EXAM: CT HEAD WITHOUT CONTRAST CT ANGIOGRAPHY HEAD AND NECK CT PERFUSION BRAIN TECHNIQUE: Multidetector CT imaging of the head and neck was performed using the standard protocol during bolus administration of intravenous contrast. Multiplanar CT image reconstructions and MIPs were obtained to evaluate the vascular anatomy. Carotid stenosis measurements (when applicable) are obtained utilizing NASCET criteria, using the distal internal carotid diameter as the denominator. Multiphase CT imaging of the brain was performed following IV bolus contrast injection. Subsequent parametric perfusion maps were calculated using RAPID software. CONTRAST:  77mL OMNIPAQUE IOHEXOL 350 MG/ML SOLN; 31mL OMNIPAQUE IOHEXOL 350 MG/ML SOLN COMPARISON:  CTA 09/17/2020 FINDINGS: CTA NECK FINDINGS Aortic arch: Brachiocephalic and common carotid origins are not imaged. Otherwise, patent great vessels. Right carotid system: No evidence of dissection, significant stenosis (50% or greater) or occlusion. Left carotid system: Left common carotid artery origin is not imaged. No visible dissection, significant stenosis (50% or greater), or occlusion. Vertebral arteries: Codominant. No evidence of dissection, stenosis (50% or greater) or occlusion. Similar multifocal mild stenosis of the right V1 vertebral artery. Skeleton: No evidence of acute bony abnormality on limited assessment. Similar alignment. Similar multilevel  degenerative change, including moderate degenerative disc disease at C5-C6 with disc height loss, vacuum disc phenomenon and posterior disc osteophyte complex. Other neck: No mass or adenopathy. Upper chest: Emphysema.  Visualized lung apices are clear. Review of the MIP images confirms the above findings CTA HEAD FINDINGS Anterior circulation: Patent bilateral internal carotid arteries with similar calcific atherosclerosis. No evidence of greater than 50% stenosis of the ICAs. Redemonstrated fluid over device within the right M1 MCA. Artifact limits evaluation for in stent stenosis; however, there is flow proximal and distal to the stent, compatible with patency. 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 Left MCA and ACAs are patent. Similar 1-2 mm vascular protrusion projecting anteriorly from the anterior communicating artery Posterior circulation: No evidence large vessel occlusion or proximal hemodynamically significant stenosis. Similar right posterior communicating artery. Venous sinuses: As permitted by contrast timing, patent. Review of the MIP images confirms the above findings CT Brain Perfusion Findings: CBF (<30%) Volume: 13mL Perfusion (Tmax>6.0s) volume: 23mL Mismatch Volume: 39mL in the posterior right MCA territory, which likely  is in the territory supplied by the M2 MCA stenosis described above. Infarction Location:None. IMPRESSION: 1. 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 2. Approximately 15 mm area penumbra/mismatch in the posterior right MCA territory, which likely is in the territory supplied by the M2 MCA stenosis described above. No core infarct identified. 3. Streak artifact limits evaluation for right MCA in-stent stenosis; however, there is opacification proximal and distal to the stent,  similar to prior and consistent with stent patency. 4. Redemonstrated flow diverter device within the M1 right MCA. 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 5. Similar 1-2 mm anteriorly projecting outpouching arising from the anterior communicating artery, which may represent a small aneurysm versus infundibulum with small associated vessel not seen. CTA findings were communicated on 11/29/2020 at 12:08 pm to provider Dr. Otelia LimesLindzen via telephones. Perfusion findings communicated on 11/29/2020 at 12:19 pm to provider Dr. Otelia LimesLindzen via telephone. Electronically Signed   By: Feliberto HartsFrederick S Jones MD   On: 11/29/2020 12:31   DG CHEST PORT 1 VIEW  Result Date: 11/29/2020 CLINICAL DATA:  Postoperative hip fracture repair.  Cough. EXAM: PORTABLE CHEST 1 VIEW COMPARISON:  November 27, 2020 FINDINGS: There is consolidation in the left lower lobe. There is ill-defined patchy airspace opacity in the right base. The heart size and pulmonary vascularity are normal. No adenopathy. No bone lesions. Contrast noted in each renal collecting system. IMPRESSION: Airspace consolidation with volume loss left lower lung region. Patchy infiltrate right base. Suspect bibasilar pneumonia. Underlying aspiration is possible. Heart size normal. Electronically Signed   By: Bretta BangWilliam  Woodruff III M.D.   On: 11/29/2020 16:46   DG C-Arm 1-60 Min-No Report  Result Date: 11/28/2020 Fluoroscopy was utilized by the requesting physician.  No radiographic interpretation.   DG Hip Port Unilat With Pelvis 1V Left  Result Date: 11/28/2020 CLINICAL DATA:  Post left hip pinning EXAM: DG HIP (WITH OR WITHOUT PELVIS) 1V PORT LEFT COMPARISON:  None. FINDINGS: Screws within the left femoral neck. No complicating feature. Mild degenerative changes in the left hip. IMPRESSION: Pinning of the left hip.  No visible complicating feature. Electronically Signed   By: Charlett NoseKevin  Dover M.D.   On: 11/28/2020 18:04   DG HIP OPERATIVE UNILAT W OR W/O PELVIS LEFT  Result Date:  11/28/2020 CLINICAL DATA:  Left hip pinning. EXAM: OPERATIVE LEFT HIP (WITH PELVIS IF PERFORMED) TECHNIQUE: Fluoroscopic spot image(s) were submitted for interpretation post-operatively. COMPARISON:  Preoperative radiograph yesterday. FINDINGS: Three fluoroscopic spot views obtained of the left hip in the operating room in frontal and lateral projections. Two screws traverse femoral neck fracture. Total fluoroscopy time 43 seconds. IMPRESSION: Procedural fluoroscopy after left hip fracture pinning. Electronically Signed   By: Narda RutherfordMelanie  Sanford M.D.   On: 11/28/2020 18:52   CT HEAD CODE STROKE WO CONTRAST  Result Date: 11/29/2020 CLINICAL DATA:  Code stroke. Acute neuro deficit. Rule out stroke. Left-sided weakness. EXAM: CT HEAD WITHOUT CONTRAST TECHNIQUE: Contiguous axial images were obtained from the base of the skull through the vertex without intravenous contrast. COMPARISON:  CT head 11/27/2020 FINDINGS: Brain: Generalized atrophy. Chronic microvascular ischemic change in the white matter. Chronic lacunar infarction in the right head of caudate and adjacent internal capsule, unchanged. Negative for acute infarct, hemorrhage, mass. Vascular: Negative for hyperdense vessel. Stent in the right M1 segment unchanged. Skull: Negative Sinuses/Orbits: Air-fluid level left maxillary sinus. Remaining sinuses clear. Right cataract extraction.  No orbital mass. Other: Image quality degraded by motion. ASPECTS [facility] Stroke Program Early CT Score) - Ganglionic level infarction (caudate, lentiform nuclei, internal capsule, insula, M1-M3 cortex): 7 - Supraganglionic infarction (M4-M6 cortex): 3 Total score (0-10 with 10 being normal): 10 IMPRESSION: 1. No acute abnormality and no change from 2 days prior 2. ASPECTS is 10 3. Atrophy and chronic ischemic changes, stable from the prior study. 4. These results were called by telephone at the time of interpretation on 11/29/2020 at 11:56 am to provider A Shaune Spittle , who verbally  acknowledged these results. Electronically Signed   By: Marlan Palau M.D.   On: 11/29/2020 11:57   CT ANGIO HEAD NECK W WO CM (CODE STROKE)  Result Date: 11/29/2020 CLINICAL DATA:  Acute stroke suspected. EXAM: CT HEAD WITHOUT CONTRAST CT ANGIOGRAPHY HEAD AND NECK CT PERFUSION BRAIN TECHNIQUE: Multidetector CT imaging of the head and neck was performed using the standard protocol during bolus administration of intravenous contrast. Multiplanar CT image reconstructions and MIPs were obtained to evaluate the vascular anatomy. Carotid stenosis measurements (when applicable) are obtained utilizing NASCET criteria, using the distal internal carotid diameter as the denominator. Multiphase CT imaging of the brain was performed following IV bolus contrast injection. Subsequent parametric perfusion maps were calculated using RAPID software. CONTRAST:  60mL OMNIPAQUE IOHEXOL 350 MG/ML SOLN; 40mL OMNIPAQUE IOHEXOL 350 MG/ML SOLN COMPARISON:  CTA 09/17/2020 FINDINGS: CTA NECK FINDINGS Aortic arch: Brachiocephalic and common carotid origins are not imaged. Otherwise, patent great vessels. Right carotid system: No evidence of dissection, significant stenosis (50% or greater) or occlusion. Left carotid system: Left common carotid artery origin is not imaged. No visible dissection, significant stenosis (50% or greater), or occlusion. Vertebral arteries: Codominant. No evidence of dissection, stenosis (50% or greater) or occlusion. Similar multifocal mild stenosis of the right V1 vertebral artery. Skeleton: No evidence of acute bony abnormality on limited assessment. Similar alignment. Similar multilevel degenerative change, including moderate degenerative disc disease at C5-C6 with disc height loss, vacuum disc phenomenon and posterior disc osteophyte complex. Other neck: No mass or adenopathy. Upper chest: Emphysema.  Visualized lung apices are clear. Review of the MIP images confirms the above findings CTA HEAD FINDINGS  Anterior circulation: Patent bilateral internal carotid arteries with similar calcific atherosclerosis. No evidence of greater than 50% stenosis of the ICAs. Redemonstrated fluid over device within the right M1 MCA. Artifact limits evaluation for in stent stenosis; however, there is flow proximal and distal to the stent, compatible with patency. 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 Left MCA and ACAs are patent. Similar 1-2 mm vascular protrusion projecting anteriorly from the anterior communicating artery Posterior circulation: No evidence large vessel occlusion or proximal hemodynamically significant stenosis. Similar right posterior communicating artery. Venous sinuses: As permitted by contrast timing, patent. Review of the MIP images confirms the above findings CT Brain Perfusion Findings: CBF (<30%) Volume: 0mL Perfusion (Tmax>6.0s) volume: 15mL Mismatch Volume: 15mL in the posterior right MCA territory, which likely is in the territory supplied by the M2 MCA stenosis described above. Infarction Location:None. IMPRESSION: 1. 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 2. Approximately 15 mm area penumbra/mismatch in the posterior right MCA territory, which likely is in the territory supplied by the M2 MCA stenosis described above. No core infarct identified. 3. Streak artifact limits  evaluation for right MCA in-stent stenosis; however, there is opacification proximal and distal to the stent, similar to prior and consistent with stent patency. 4. Redemonstrated flow diverter device within the M1 right MCA. 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 5. Similar 1-2 mm anteriorly projecting outpouching arising from the anterior communicating artery, which may represent a small aneurysm versus infundibulum with small  associated vessel not seen. CTA findings were communicated on 11/29/2020 at 12:08 pm to provider Dr. Otelia Limes via telephones. Perfusion findings communicated on 11/29/2020 at 12:19 pm to provider Dr. Otelia Limes via telephone. Electronically Signed   By: Feliberto Harts MD   On: 11/29/2020 12:31        Scheduled Meds: .  stroke: mapping our early stages of recovery book   Does not apply Once  . aspirin  81 mg Oral Daily   Or  . aspirin  300 mg Rectal Daily  . Chlorhexidine Gluconate Cloth  6 each Topical Daily  . clopidogrel  75 mg Oral Daily  . docusate sodium  100 mg Oral BID  . enoxaparin (LOVENOX) injection  40 mg Subcutaneous Q24H  . famotidine  20 mg Oral Daily  . ferrous sulfate  325 mg Oral TID PC  . fluticasone furoate-vilanterol  1 puff Inhalation Daily  . mupirocin ointment  1 application Nasal BID  . pantoprazole (PROTONIX) IV  40 mg Intravenous Q24H  . senna  1 tablet Oral BID   Continuous Infusions: . sodium chloride 250 mL (11/28/20 2011)  . ampicillin-sulbactam (UNASYN) IV 1.5 g (11/30/20 0700)  . dextrose 5 % and 0.9% NaCl 75 mL/hr at 11/30/20 0606     LOS: 3 days      Kathlen Mody, MD [facility]   To contact the attending provider between 7A-7P or the covering provider during after hours 7P-7A, please log into the web site [email or web address] and access using universal [place] password for that web site. If you do not have the password, please call the hospital operator.  11/30/2020, 11:12 AM

## 2020-11-30 NOTE — Evaluation (Signed)
Physical Therapy Re-Evaluation Patient Details Name: Barbara Hill MRN: [id number] DOB: 10-05-1940 Today's Date: 11/30/2020   History of Present Illness  80 y/o female presented to ED on 4/2 after fall, s/p L cannulated hip pinning 4/7. Code stroke was initiated on 4/8. 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 PMH: dementia, anxiety, depression, brain aneurysm, CVA, CKD 3, COPD, GERD, paroxysmal SVT, normocytic anemia, orthostatic hypotension, mitral valve prolapse  Clinical Impression  Re-eval completed due to change in status and new medical diagnosis. Patient with new R CVA. Patient presents with L inattention, L sided weakness (in conjunction with recent L hip surgery and R CVA), impaired balance, decreased activity tolerance, and impaired functional mobility. Patient required totalA+2 for bed mobility and maxA+2 for sit to stand with HHAx2. Patient required mod-maxA to maintain sitting balance EOB due to L lateral lean. Patient will benefit from skilled PT services during acute stay to address listed deficits. Recommend SNF following discharge to maximize functional mobility and safety.      Follow Up Recommendations SNF;Supervision/Assistance - 24 hour    Equipment Recommendations  None recommended by PT    Recommendations for Other Services       Precautions / Restrictions Precautions Precautions: Fall Restrictions Weight Bearing Restrictions: Yes LLE Weight Bearing: Weight bearing as tolerated      Mobility  Bed Mobility Overal bed mobility: Needs Assistance Bed Mobility: Supine to Sit;Sit to Supine     Supine to sit: Total assist;+2 for physical assistance;+2 for safety/equipment Sit to supine: Total assist;+2 for physical assistance;+2 for  safety/equipment   General bed mobility comments: No initiation noted from patient and distracted throughout with attempting to find her parents in the room    Transfers Overall transfer level: Needs assistance Equipment used: 2 person hand held assist Transfers: Sit to/from Stand Sit to Stand: Max assist;+2 safety/equipment;+2 physical assistance         General transfer comment: MaxA+2 for power up into standing and unable to take steps this session. Requested to sit down and reports feeling tired, deferred pivot transfer to chair this session  Ambulation/Gait                Stairs            Wheelchair Mobility    Modified Rankin (Stroke Patients Only) Modified Rankin (Stroke Patients Only) Pre-Morbid Rankin Score: No symptoms Modified Rankin: Severe disability     Balance Overall balance assessment: Needs assistance Sitting-balance support: Feet supported Sitting balance-Leahy Scale: Poor Sitting balance - Comments: L lateral lean in sitting with overcorrection when cued, requires mod-maxA to maintain upright sitting Postural control: Left lateral lean Standing balance support: Bilateral upper extremity supported;During functional activity Standing balance-Leahy Scale: Zero Standing balance comment: reliant on UE support and external support                             Pertinent Vitals/Pain Pain Assessment: Faces Faces Pain Scale: Hurts little more Pain Location: L LE with movement Pain Descriptors / Indicators: Grimacing Pain Intervention(s): Monitored during session;Repositioned    Home Living Family/patient expects to be discharged to:: Private residence Living Arrangements: Children Available Help at Discharge: Family;Available 24 hours/day Type of Home: House Home Access: Stairs to enter Entrance Stairs-Rails: None Entrance  Stairs-Number of Steps: 2 Home Layout: One level Home Equipment: Grab bars - toilet;Grab bars -  tub/shower;Carmesha [place] - 4 wheels;Bedside commode;Shower seat Additional Comments: Information gathered from chart review. lives with daughter, grandson and his girlfriend    Prior Function Level of Independence: Independent         Comments: walks without AD, sometimes gets "overbalanced" but denies falls in past 1 year, daughter assists with shower transfers as needed. reports independence with ADLs - some assist for shower if she needs it (obtained from previous admission)     Hand Dominance        Extremity/Trunk Assessment   Upper Extremity Assessment Upper Extremity Assessment: Defer to OT evaluation    Lower Extremity Assessment Lower Extremity Assessment: Generalized weakness LLE: Unable to fully assess due to pain    Cervical / Trunk Assessment Cervical / Trunk Assessment: Kyphotic  Communication   Communication: No difficulties  Cognition Arousal/Alertness: Awake/alert Behavior During Therapy: WFL for tasks assessed/performed Overall Cognitive Status: History of cognitive impairments - at baseline                                 General Comments: Oriented to self and place. Requires increased time and verbal cues      General Comments      Exercises     Assessment/Plan    PT Assessment Patient needs continued PT services  PT Problem List Decreased strength;Decreased range of motion;Decreased activity tolerance;Decreased balance;Decreased mobility;Decreased knowledge of use of DME;Decreased safety awareness;Decreased knowledge of precautions       PT Treatment Interventions DME instruction;Gait training;Stair training;Functional mobility training;Therapeutic activities;Therapeutic exercise;Balance training;Neuromuscular re-education;Patient/family education    PT Goals (Current goals can be found in the Care Plan section)  Acute Rehab PT Goals Patient Stated Goal: did not state PT Goal Formulation: With family Time For Goal Achievement:  12/14/20 Potential to Achieve Goals: Fair    Frequency Min 3X/week   Barriers to discharge        Co-evaluation PT/OT/SLP Co-Evaluation/Treatment: Yes Reason for Co-Treatment: Complexity of the patient's impairments (multi-system involvement);For patient/therapist safety;To address functional/ADL transfers;Necessary to address cognition/behavior during functional activity PT goals addressed during session: Mobility/safety with mobility;Balance         AM-PAC PT "6 Clicks" Mobility  Outcome Measure Help needed turning from your back to your side while in a flat bed without using bedrails?: A Lot Help needed moving from lying on your back to sitting on the side of a flat bed without using bedrails?: A Lot Help needed moving to and from a bed to a chair (including a wheelchair)?: A Lot Help needed standing up from a chair using your arms (e.g., wheelchair or bedside chair)?: A Lot Help needed to walk in hospital room?: Total Help needed climbing 3-5 steps with a railing? : Total 6 Click Score: 10    End of Session Equipment Utilized During Treatment: Gait belt Activity Tolerance: Patient limited by fatigue Patient left: in bed;with call bell/phone within reach;with bed alarm set;with family/visitor present;with nursing/sitter in room Nurse Communication: Mobility status PT Visit Diagnosis: Unsteadiness on feet (R26.81);Other abnormalities of gait and mobility (R26.89);Muscle weakness (generalized) (M62.81);Pain Pain - Right/Left: Left Pain - part of body: Hip    Time: 1030-1102 PT Time Calculation (min) (ACUTE ONLY): 32 min   Charges:   PT Evaluation $PT Re-evaluation: 1 Re-eval PT Treatments $Therapeutic Activity: 8-22 mins  Omni Dunsworth A. Dan Humphreys PT, DPT Acute Rehabilitation Services Pager [phone] Office [phone]   Viviann Spare 11/30/2020, 2:09 PM

## 2020-11-30 NOTE — Evaluation (Addendum)
Clinical/Bedside Swallow Evaluation Patient Details  Name: GENETTA FIERO MRN: [id number] Date of Birth: 02-12-1941  Today's Date: 11/30/2020 Time: SLP Start Time (ACUTE ONLY): 0850 SLP Stop Time (ACUTE ONLY): 0914 SLP Time Calculation (min) (ACUTE ONLY): 24 min  Past Medical History:  Past Medical History:  Diagnosis Date  . Anxiety   . Brain aneurysm    pipeline stent 2017   . COPD (chronic obstructive pulmonary disease) (HCC)   . Fatigue   . H/O: hysterectomy   . Headache(784.0)   . History of cardiac catheterization    LHC 10/02: Normal coronary arteries  . History of nuclear stress test    Myoview 10/12: Normal stress nuclear study.  . Hypertension   . Mitral valve prolapse    Echo 8/17:Mild LVH, EF 60-65, normal wall motion, grade 2 diastolic dysfunction, mild AI, mild MR, mild LAE // echo 11/01: EF 60%, mild anterior MVP with trivial MR, PASP 35-40 mmHg, LAE  . Orthostasis   . PSVT (paroxysmal supraventricular tachycardia) (HCC)    palps  . Radiculopathy    Past Surgical History:  Past Surgical History:  Procedure Laterality Date  . ABDOMINAL HYSTERECTOMY    . APPENDECTOMY    . BUBBLE STUDY  01/29/2020   Procedure: BUBBLE STUDY;  Surgeon: Parke Poisson, MD;  Location: [facility] ENDOSCOPY;  Service: Cardiology;;  . CARDIAC CATHETERIZATION  12/95  . CATARACT EXTRACTION    . CHOLECYSTECTOMY    . EMBOLECTOMY Bilateral 01/25/2020   Procedure: Bilateral popliteal EMBOLECTOMY.;  Surgeon: [name], MD;  Location: [facility] OR;  Service: Vascular;  Laterality: Bilateral;  . FASCIOTOMY Bilateral 01/26/2020   Procedure: FASCIOTOMY;  Surgeon: [name], MD;  Location: [facility] OR;  Service: Vascular;  Laterality: Bilateral;  . HIP PINNING,CANNULATED Left 11/28/2020   Procedure: CANNULATED HIP PINNING;  Surgeon: Teryl Lucy, MD;  Location: [facility] ORS;  Service: Orthopedics;  Laterality: Left;  . IR GENERIC HISTORICAL  06/02/2016   IR RADIOLOGIST EVAL & MGMT 06/02/2016 [facility]INTER[facility] RAD  . IR  GENERIC HISTORICAL  06/29/2016   IR ANGIO INTRA EXTRACRAN SEL INTERNAL CAROTID UNI R MOD SED 06/29/2016 [name], MD [facility]  . IR GENERIC HISTORICAL  06/29/2016   IR ANGIOGRAM FOLLOW UP STUDY 06/29/2016 [name], MD [facility]  . IR GENERIC HISTORICAL  06/29/2016   IR NEURO EACH ADD'L AFTER BASIC UNI RIGHT (MS) 06/29/2016 [name], MD [facility]  . IR GENERIC HISTORICAL  06/29/2016   IR 3D INDEPENDENT WKST 06/29/2016 [name], MD [facility]  . IR GENERIC HISTORICAL  06/29/2016   IR TRANSCATH/EMBOLIZ 06/29/2016 [name], MD [facility]  . IR GENERIC HISTORICAL  06/29/2016   IR ANGIO VERTEBRAL SEL SUBCLAVIAN INNOMINATE UNI R MOD SED 06/29/2016 [name], MD [facility]  . IR GENERIC HISTORICAL  07/28/2016   IR RADIOLOGIST EVAL & MGMT 07/28/2016 [facility]  . IR GENERIC HISTORICAL  11/12/2016   IR ANGIO INTRA EXTRACRAN SEL COM CAROTID INNOMINATE BILAT MOD SED 11/12/2016 [name], MD [facility]  . IR GENERIC HISTORICAL  11/12/2016   IR ANGIO VERTEBRAL SEL VERTEBRAL UNI L MOD SED 11/12/2016 [name], MD [facility]  . IR GENERIC HISTORICAL  11/12/2016   IR ANGIO VERTEBRAL SEL SUBCLAVIAN INNOMINATE UNI R MOD SED 11/12/2016 [name], MD [facility]  . NASAL SEPTUM SURGERY    . RADIOLOGY WITH ANESTHESIA N/A 06/29/2016   Procedure: EMBOLIZATION;  Surgeon: [name], MD;  Location: [facility] OR;  Service: Radiology;  Laterality:  N/A;  . ROTATOR CUFF REPAIR    . TEE WITHOUT CARDIOVERSION N/A 01/29/2020   Procedure: TRANSESOPHAGEAL ECHOCARDIOGRAM (TEE);  Surgeon: Parke Poisson, MD;  Location: [facility] ENDOSCOPY;  Service: Cardiology;  Laterality: N/A;   HPI:  80 year old female with past medical history of dementia, anxiety, depression, s/p R MCA stent in 2017 complicated by CVA, PVD s/p embolectomy for popliteal occlusions, CKD 3, cerebral aneurysm, COPD, GERD, paroxysmal SVT, orthostatic hypotension, mitral valve  prolapse, recent hospitalization from 3/28-4/1 for CAP who presented to [facility] for evaluation of knee pain following a fall on April 2.  Per chart, she was found to have a left hip fracture and is now status post surgery with ortho on 4/7.  Developed delirium with left upper extremity weakness on 4/8 and code stroke was called.  MRI revealed Area of restricted diffusion involving the posterior aspect of the right insula and right frontoparietal region, corresponding to area of increased T-max on prior CT perfusion, consistent with acute/subacute infarct, remote infarct in the right basal ganglia region, moderate chronic small vessel ischemia.   Assessment / Plan / Recommendation Clinical Impression  SLP removed dried secretions from hard palate in pt with cachexia who is endentulous without dentures present. Her volitional cough is weak, vocal quality hoarse (suspect baseline) and no cranial nerve involvement during oral motor exam. Demaeanor is pleasant, follows commands with pleasant confusion.  Consumed cup and straw sips water in approximately 1 oz increments with delayed coughs suspicious for airway involvement. Recommend pt initiate Dys 1 (puree) texture, nectar thick liquids until instrumental assessment with MBS can be performed (cannot guarantee this weeked and pt having angiogram Monday). Recommend pills crushed and full assist with meals and strategies.      Aspiration Risk       Diet Recommendation   Dys 1 (puree, nectar thick liquids       Other  Recommendations     Follow up Recommendations   SNF     Frequency and Duration   2 x week for 2 weeks      Prognosis   fair-good     Swallow Study   General HPI: 80 year old female with past medical history of dementia, anxiety, depression, s/p R MCA stent in 2017 complicated by CVA, PVD s/p embolectomy for popliteal occlusions, CKD 3, cerebral aneurysm, COPD, GERD, paroxysmal SVT, orthostatic hypotension, mitral valve prolapse, recent  hospitalization from 3/28-4/1 for CAP who presented to [facility] for evaluation of knee pain following a fall on April 2.  Per chart, she was found to have a left hip fracture and is now status post surgery with ortho on 4/7.  Developed delirium with left upper extremity weakness on 4/8 and code stroke was called.  MRI revealed Area of restricted diffusion involving the posterior aspect of the right insula and right frontoparietal region, corresponding to area of increased T-max on prior CT perfusion, consistent with acute/subacute infarct, remote infarct in the right basal ganglia region, moderate chronic small vessel ischemia. Type of Study: Bedside Swallow Evaluation Previous Swallow Assessment: 2019 BSE rec regular/thin Diet Prior to this Study: NPO Temperature Spikes Noted: No Respiratory Status: Nasal cannula History of Recent Intubation: No Behavior/Cognition: Alert;Cooperative;Requires cueing Oral Cavity Assessment: Dry;Dried secretions Oral Care Completed by SLP: Yes Oral Cavity - Dentition: Edentulous;Dentures, not available Vision: Functional for self-feeding Self-Feeding Abilities: Needs assist Patient Positioning: Upright in bed Baseline Vocal Quality: Normal Volitional Cough: Weak Volitional Swallow: Able to elicit    Oral/Motor/Sensory Function Overall Oral Motor/Sensory Function:  Within functional limits   Ice Chips Ice chips: Not tested   Thin Liquid Thin Liquid: Impaired Presentation: Cup;Straw Pharyngeal  Phase Impairments: Cough - Delayed;Throat Clearing - Delayed    Nectar Thick Nectar Thick Liquid: Not tested   Honey Thick Honey Thick Liquid: Not tested   Puree Puree: Within functional limits   Solid     Solid: Not tested Presentation:  (NT)      Royce Macadamia 11/30/2020,9:51 AM  Breck Coons Lonell Face.Ed Nurse, children's [phone] Office [phone]

## 2020-11-30 NOTE — Evaluation (Addendum)
Occupational Therapy Re-Evaluation Patient Details Name: Barbara Hill MRN: [id number] DOB: 10-Feb-1941 Today's Date: 11/30/2020    History of Present Illness 80 y/o female presented to ED on 4/2 after fall, s/p L cannulated hip pinning 4/7. Code stroke was initiated on 4/8. 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 PMH: dementia, anxiety, depression, brain aneurysm, CVA, CKD 3, COPD, GERD, paroxysmal SVT, normocytic anemia, orthostatic hypotension, mitral valve prolapse   Clinical Impression   Re-eval. Pt PTA:  Pt living with daughter who provides 24/7. Pt was mostly independent with ADL and mobility. Pt currently, very limited by dementia, decreased strength, decreased ability to care for self and decreased mobility. Pt following 50% of simple commands, but speaking about random tangents specifically her spouse who passed away in 07/15/2023. can feel L arm, but does not attempt to move it and unaware of lateral lean. Pt maxA to totalA for ADL and totalA+2 for bed mobility, maxA +2 for transfers to and from bed <-> BSC and pt fatigued at EOB x5 mins with modA to maxA for stability. Pt would benefit from continued OT skilled services for ADL,mobility and L sided deficits for post acute rehab setting. OT following acutely.       Follow Up Recommendations  SNF    Equipment Recommendations  None recommended by OT    Recommendations for Other Services       Precautions / Restrictions Precautions Precautions: Fall Restrictions Weight Bearing Restrictions: Yes LLE Weight Bearing: Weight bearing as tolerated      Mobility Bed Mobility Overal bed mobility: Needs Assistance Bed Mobility: Supine to Sit;Sit to Supine     Supine to sit: Total assist;+2 for physical  assistance;+2 for safety/equipment Sit to supine: Total assist;+2 for physical assistance;+2 for safety/equipment   General bed mobility comments: tangential in thoughts;decreased awareness of deficits other than "it hurts to move."    Transfers Overall transfer level: Needs assistance Equipment used: 2 person hand held assist Transfers: Sit to/from Stand Sit to Stand: Max assist;+2 safety/equipment;+2 physical assistance         General transfer comment: MaxA+2 for power up  to stand from bed x1. Requested to sit down and reports feeling tired, deferred pivot transfer to chair this session    Balance Overall balance assessment: Needs assistance Sitting-balance support: Feet supported Sitting balance-Leahy Scale: Poor Sitting balance - Comments: L lateral lean in sitting with overcorrection when cued, requires mod-maxA to maintain upright sitting Postural control: Left lateral lean Standing balance support: Bilateral upper extremity supported;During functional activity Standing balance-Leahy Scale: Zero Standing balance comment: reliant on UE support and external support                           ADL either performed or assessed with clinical judgement   ADL Overall ADL's : Needs assistance/impaired Eating/Feeding: Maximal assistance   Grooming: Maximal assistance   Upper Body Bathing: Maximal assistance;Bed level   Lower Body Bathing: Total assistance;Sitting/lateral leans;Bed level   Upper Body Dressing : Maximal assistance;Sitting   Lower Body Dressing: Total assistance;Bed level   Toilet Transfer: Total assistance;+2 for physical assistance;+2 for safety/equipment Toilet Transfer Details (indicate cue type and reason): use lift or dependently transfer Toileting- Clothing Manipulation and Hygiene: Total assistance  Functional mobility during ADLs: Maximal assistance;+2 for safety/equipment;+2 for physical assistance General ADL Comments: Pt very  limited by dementia, decreased strength, decreased ability to care for self and decreased mobility. Pt following 50% of simple commands, but speaking about random tangents specifically her spouse who passed away in 01-Aug-2023.     Vision Baseline Vision/History: No visual deficits Patient Visual Report: Other (comment) (L inattention) Vision Assessment?: Vision impaired- to be further tested in functional context;Yes Eye Alignment: Within Functional Limits Ocular Range of Motion: Impaired-to be further tested in functional context Tracking/Visual Pursuits: Impaired - to be further tested in functional context Additional Comments: Pt inattentive on L side     Perception Perception Perception Tested?: Yes Perception Deficits: Inattention/neglect Inattention/Neglect: Does not attend to left visual field;Impaired- to be further tested in functional context Spatial deficits: can feel L arm, but does not attempt to move it and unaware of lateral lean.   Praxis      Pertinent Vitals/Pain Pain Assessment: Faces Faces Pain Scale: Hurts little more Pain Location: L LE with movement Pain Descriptors / Indicators: Grimacing Pain Intervention(s): Monitored during session;Premedicated before session     Hand Dominance Right   Extremity/Trunk Assessment Upper Extremity Assessment Upper Extremity Assessment: Generalized weakness;LUE deficits/detail LUE Deficits / Details: L inattention   Lower Extremity Assessment Lower Extremity Assessment: Generalized weakness;Defer to PT evaluation LLE: Unable to fully assess due to pain   Cervical / Trunk Assessment Cervical / Trunk Assessment: Kyphotic   Communication Communication Communication: No difficulties   Cognition Arousal/Alertness: Awake/alert Behavior During Therapy: WFL for tasks assessed/performed Overall Cognitive Status: History of cognitive impairments - at baseline                                 General Comments:  Oriented to self and place. Requires increased time and verbal cues; Pt tangential in thoughts and follows 50% of 1 step commands. Pt with demetia at baseline. Pt unaware of spouse's "recent death" in December 08, 2021and keeps repeating that she's looking for him. L side inattention.   General Comments  Pt on 2L O2, but remained at 100% O2 throughout exertion.    Exercises     Shoulder Instructions      Home Living Family/patient expects to be discharged to:: Private residence Living Arrangements: Children Available Help at Discharge: Family;Available 24 hours/day Type of Home: House Home Access: Stairs to enter [employer] of Steps: 2 Entrance Stairs-Rails: None Home Layout: One level     Bathroom Shower/Tub: Producer, television/film/video: Standard     Home Equipment: Grab bars - toilet;Grab bars - tub/shower;Walker - 4 wheels;Bedside commode;Shower seat   Additional Comments: Information gathered from chart review. lives with daughter, grandson and his girlfriend      Prior Functioning/Environment Level of Independence: Independent        Comments: walks without AD, sometimes gets "overbalanced" but denies falls in past 1 year, daughter assists with shower transfers as needed. reports independence with ADLs - some assist for shower if she needs it (obtained from previous admission)        OT Problem List: Decreased strength;Decreased activity tolerance;Impaired balance (sitting and/or standing);Decreased knowledge of use of DME or AE;Pain;Increased edema      OT Treatment/Interventions: Self-care/ADL training;Therapeutic exercise;Energy conservation;DME and/or AE instruction;Therapeutic activities;Patient/family education;Balance training;Cognitive remediation/compensation    OT Goals(Current goals can be found in the care plan section) Acute Rehab OT Goals  Patient Stated Goal: did not state OT Goal Formulation: With patient/family Time For Goal Achievement:  12/20/20 Potential to Achieve Goals: Fair  OT Frequency: Min 2X/week   Barriers to D/C:            Co-evaluation PT/OT/SLP Co-Evaluation/Treatment: Yes Reason for Co-Treatment: Complexity of the patient's impairments (multi-system involvement);For patient/therapist safety   OT goals addressed during session: ADL's and self-care      AM-PAC OT "6 Clicks" Daily Activity     Outcome Measure Help from another person eating meals?: A Lot Help from another person taking care of personal grooming?: A Lot Help from another person toileting, which includes using toliet, bedpan, or urinal?: Total Help from another person bathing (including washing, rinsing, drying)?: Total Help from another person to put on and taking off regular upper body clothing?: A Lot Help from another person to put on and taking off regular lower body clothing?: Total 6 Click Score: 9   End of Session Equipment Utilized During Treatment: Gait belt;Rolling walker;Oxygen Nurse Communication: Mobility status;Precautions  Activity Tolerance: Patient tolerated treatment well Patient left: in bed;with call bell/phone within reach;with bed alarm set;with family/visitor present  OT Visit Diagnosis: Unsteadiness on feet (R26.81);Muscle weakness (generalized) (M62.81);Pain;Other symptoms and signs involving cognitive function Pain - Right/Left: Left Pain - part of body: Hip                Time: 1030-1047 OT Time Calculation (min): 17 min Charges:  OT General Charges $OT Visit: 1 Visit OT Evaluation $OT Re-eval: 1 Re-eval  Flora Lipps, OTR/L Acute Rehabilitation Services Pager: [phone] Office: [phone]   Kalim Kissel C 11/30/2020, 7:36 PM

## 2020-11-30 NOTE — Consult Note (Signed)
Chief Complaint: Patient was seen in consultation today for CVA/diagnostic cerebral arteriogram.  Referring Physician(s): Caryl Pina (neurology)  Supervising Physician: [name]  Patient Status: [facility] - In-pt  History of Present Illness: Barbara Hill is a 80 y.o. female with a past medical history of hypertension, MVP, brain aneurysm s/p embolization 2017, dementia, PSVT, COPD, and anxiety. 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 [name] 06/29/2016, on long term Plavix/Aspirin. She presented to [facility] ED 11/27/2020 secondary to fall. She was found to have a left hip fracture. Orthopedics was consulted. Patient was admitted and transferred to [facility] for left hip pinning 11/28/2020 by Dr. Dion Saucier. Plavix/Aspirin was held for surgery. On 11/29/2020, patient noted to have mental status change. Code stroke was activated/neurology was consulted. Imaging revealed an acute/subacte infarct of the posterior aspect of the right insula and right frontoparietal region) in setting of right MCA M2 high-grade stenosis. She was transferred to [facility] for escalation of care.  NIR consulted by Dr. Otelia Limes for possible image-guided diagnostic cerebral arteriogram. Patient awake and alert sitting in bed working with speech therapy. Appears pleasantly confused (history of dementia) but follows simple commands. History difficult to obtain secondary to dementia but does complaint of left hip pain (as expected).  On Plavix/Aspirin/SQ Lovenox.   Past Medical History:  Diagnosis Date  . Anxiety   . Brain aneurysm    pipeline stent 2017   . COPD (chronic obstructive pulmonary disease) (HCC)   . Fatigue   . H/O: hysterectomy   . Headache(784.0)   . History of cardiac catheterization    LHC 10/02: Normal coronary arteries  . History of nuclear stress test    Myoview 10/12: Normal stress nuclear study.  . Hypertension   .  Mitral valve prolapse    Echo 8/17:Mild LVH, EF 60-65, normal wall motion, grade 2 diastolic dysfunction, mild AI, mild MR, mild LAE // echo 11/01: EF 60%, mild anterior MVP with trivial MR, PASP 35-40 mmHg, LAE  . Orthostasis   . PSVT (paroxysmal supraventricular tachycardia) (HCC)    palps  . Radiculopathy     Past Surgical History:  Procedure Laterality Date  . ABDOMINAL HYSTERECTOMY    . APPENDECTOMY    . BUBBLE STUDY  01/29/2020   Procedure: BUBBLE STUDY;  Surgeon: Parke Poisson, MD;  Location: [facility] ENDOSCOPY;  Service: Cardiology;;  . CARDIAC CATHETERIZATION  12/95  . CATARACT EXTRACTION    . CHOLECYSTECTOMY    . EMBOLECTOMY Bilateral 01/25/2020   Procedure: Bilateral popliteal EMBOLECTOMY.;  Surgeon: [name], MD;  Location: [facility] OR;  Service: Vascular;  Laterality: Bilateral;  . FASCIOTOMY Bilateral 01/26/2020   Procedure: FASCIOTOMY;  Surgeon: [name], MD;  Location: [facility] OR;  Service: Vascular;  Laterality: Bilateral;  . HIP PINNING,CANNULATED Left 11/28/2020   Procedure: CANNULATED HIP PINNING;  Surgeon: Teryl Lucy, MD;  Location: [facility] ORS;  Service: Orthopedics;  Laterality: Left;  . IR GENERIC HISTORICAL  06/02/2016   IR RADIOLOGIST EVAL & MGMT 06/02/2016 [facility]INTER[facility] RAD  . IR GENERIC HISTORICAL  06/29/2016   IR ANGIO INTRA EXTRACRAN SEL INTERNAL CAROTID UNI R MOD SED 06/29/2016 [name], MD [facility]  . IR GENERIC HISTORICAL  06/29/2016   IR ANGIOGRAM FOLLOW UP STUDY 06/29/2016 [name], MD [facility]  . IR GENERIC HISTORICAL  06/29/2016   IR NEURO EACH ADD'L AFTER BASIC UNI RIGHT (MS) 06/29/2016 [name], MD [facility]  .  IR GENERIC HISTORICAL  06/29/2016   IR 3D INDEPENDENT WKST 06/29/2016 [name], MD [facility]  . IR GENERIC HISTORICAL  06/29/2016   IR TRANSCATH/EMBOLIZ 06/29/2016 [name], MD [facility]  . IR GENERIC HISTORICAL  06/29/2016   IR ANGIO VERTEBRAL SEL SUBCLAVIAN INNOMINATE UNI R MOD SED 06/29/2016  [name], MD [facility]  . IR GENERIC HISTORICAL  07/28/2016   IR RADIOLOGIST EVAL & MGMT 07/28/2016 [facility]  . IR GENERIC HISTORICAL  11/12/2016   IR ANGIO INTRA EXTRACRAN SEL COM CAROTID INNOMINATE BILAT MOD SED 11/12/2016 [name], MD [facility]  . IR GENERIC HISTORICAL  11/12/2016   IR ANGIO VERTEBRAL SEL VERTEBRAL UNI L MOD SED 11/12/2016 [name], MD [facility]  . IR GENERIC HISTORICAL  11/12/2016   IR ANGIO VERTEBRAL SEL SUBCLAVIAN INNOMINATE UNI R MOD SED 11/12/2016 [name], MD [facility]  . NASAL SEPTUM SURGERY    . RADIOLOGY WITH ANESTHESIA N/A 06/29/2016   Procedure: EMBOLIZATION;  Surgeon: [name], MD;  Location: [facility] OR;  Service: Radiology;  Laterality: N/A;  . ROTATOR CUFF REPAIR    . TEE WITHOUT CARDIOVERSION N/A 01/29/2020   Procedure: TRANSESOPHAGEAL ECHOCARDIOGRAM (TEE);  Surgeon: Parke Poisson, MD;  Location: [facility] ENDOSCOPY;  Service: Cardiology;  Laterality: N/A;    Allergies: Dobutamine, Epinephrine, Aspirin, Clarithromycin, Doxycycline, Excedrin extra strength [aspirin-acetaminophen-caffeine], Penicillins, Prednisone, and Propoxyphene  Medications: Prior to Admission medications   Medication Sig Start Date End Date Taking? Authorizing Provider  acetaminophen (TYLENOL) 500 MG tablet Take 1,000 mg by mouth every 6 (six) hours as needed for moderate pain.   Yes [provider]  ALPRAZolam (XANAX) 0.5 MG tablet Take 1 tablet (0.5 mg total) by mouth 3 (three) times daily as needed for anxiety. Patient taking differently: Take 0.25-0.5 mg by mouth 3 (three) times daily as needed for anxiety. 02/09/20  Yes Gonfa, Boyce Medici, MD  amLODipine (NORVASC) 5 MG tablet TAKE 1 TABLET (5 MG TOTAL) BY MOUTH DAILY. 09/20/20 09/20/21 Yes Azucena Fallen, MD  budesonide-formoterol [facility]) 80-4.5 MCG/ACT inhaler Inhale 2 puffs into the lungs 2 (two) times daily. 11/22/20  Yes Rodolph Bong, MD   butalbital-acetaminophen-caffeine (FIORICET) [id number] MG tablet Take 1 tablet by mouth every 8 (eight) hours as needed for headache or migraine. 02/09/20  Yes Almon Hercules, MD  cholecalciferol (VITAMIN D) 1000 units tablet Take 1,000 Units by mouth daily.   Yes [provider]  clopidogrel (PLAVIX) 75 MG tablet Take 1 tablet (75 mg total) by mouth daily. 09/24/20  Yes Ekam Besson M, PA-C  famotidine (PEPCID) 20 MG tablet Take 20 mg by mouth 2 (two) times daily. 07/16/20  Yes [provider]  flecainide (TAMBOCOR) 50 MG tablet Take 1 tablet (50 mg total) by mouth 2 (two) times daily. 07/29/20  Yes Pricilla Riffle, MD  FLUoxetine (PROZAC) 20 MG capsule Take 20 mg by mouth 2 (two) times daily. 06/07/16  Yes [provider]  ibuprofen (ADVIL) 200 MG tablet Take 400 mg by mouth every 6 (six) hours as needed for mild pain.   Yes [provider]  L-Methylfolate-B6-B12 (FOLTX) 1.13-25-2 MG TABS Take 1 tablet by mouth daily. 10/09/20  Yes [provider]  levalbuterol (XOPENEX HFA) 45 MCG/ACT inhaler Inhale 2 puffs into the lungs every 4 (four) hours as needed for wheezing. Use 2 times daily x4 days, then every 4 hours as needed. 11/22/20 11/22/21 Yes Rodolph Bong, MD  losartan (COZAAR) 25 MG tablet Take 25  mg by mouth daily.   Yes [provider]  metoprolol succinate (TOPROL-XL) 25 MG 24 hr tablet TAKE 1 TABLET BY MOUTH EVERY DAY AT BEDTIME Patient taking differently: Take 25 mg by mouth at bedtime. 06/20/20  Yes Pricilla Riffle, MD  mirtazapine (REMERON) 7.5 MG tablet Take 7.5 mg by mouth at bedtime. 08/18/20  Yes [provider]  omeprazole (PRILOSEC) 40 MG capsule Take 40 mg by mouth daily.   Yes [provider]  Potassium 99 MG TABS Take 99 mg by mouth 2 (two) times daily.   Yes [provider]  feeding supplement, ENSURE ENLIVE, (ENSURE ENLIVE) LIQD Take 237 mLs by mouth 2 (two) times daily between meals. Patient not  taking: No sig reported 02/09/20   Almon Hercules, MD  ferrous sulfate 325 (65 FE) MG tablet Take 1 tablet (325 mg total) by mouth 2 (two) times daily with a meal. 02/09/20 08/07/20  Almon Hercules, MD  Multiple Vitamin (MULTIVITAMIN WITH MINERALS) TABS tablet Take 1 tablet by mouth daily. Patient not taking: No sig reported 02/09/20   Almon Hercules, MD  QUEtiapine (SEROQUEL) 25 MG tablet Take 1 tablet (25 mg total) by mouth 2 (two) times daily. Patient not taking: No sig reported 11/18/20   Anson Fret, MD  vitamin B-12 1000 MCG tablet Take 1 tablet (1,000 mcg total) by mouth daily. Patient not taking: No sig reported 02/09/20   Almon Hercules, MD     Family History  Problem Relation Age of Onset  . Heart attack Mother   . Transient ischemic attack Mother   . Dementia Mother        "at the end"  . Heart attack Father 53  . Dementia Brother   . Heart attack Other   . Asthma Other   . Heart failure Other   . Osteoporosis Other   . Heart Problems Brother   . CAD Other     Social History   Socioeconomic History  . Marital status: Widowed    Spouse name: Not on file  . Number of children: 2  . Years of education: 67  . Highest education level: Not on file  Occupational History  . Occupation: Retired  Tobacco Use  . Smoking status: Former Smoker    Packs/day: 1.00    Years: 50.00    Pack years: 50.00    Types: Cigarettes    Quit date: 11/13/2020    Years since quitting: 0.0  . Smokeless tobacco: Never Used  . Tobacco comment: stopped with recent admn for PNA  Vaping Use  . Vaping Use: Never used  Substance and Sexual Activity  . Alcohol use: No  . Drug use: No  . Sexual activity: Not on file  Other Topics Concern  . Not on file  Social History Narrative   Lives with daughter and her family   Caffeine use: none   Social Determinants of Health   Financial Resource Strain: Not on file  Food Insecurity: Not on file  Transportation Needs: Not on file  Physical  Activity: Not on file  Stress: Not on file  Social Connections: Not on file     Review of Systems: A 12 point ROS discussed and pertinent positives are indicated in the HPI above.  All other systems are negative.  Review of Systems  Unable to perform ROS: Dementia    Vital Signs: BP (!) 186/69 (BP Location: Left Arm)   Pulse 85   Temp 98.6  F (37 C) (Oral)   Resp 18   Ht 5\' 3"  (1.6 m)   Wt 93 lb 4.8 oz (42.3 kg)   SpO2 100%   BMI 16.53 kg/m   Physical Exam Vitals and nursing note reviewed.  Constitutional:      General: She is not in acute distress. Cardiovascular:     Rate and Rhythm: Normal rate and regular rhythm.     Heart sounds: Normal heart sounds. No murmur heard.   Pulmonary:     Effort: Pulmonary effort is normal. No respiratory distress.     Breath sounds: Normal breath sounds. No wheezing.  Skin:    General: Skin is warm and dry.  Neurological:     Comments: Pleasantly confused but follows simple commands. Speech clear. No facial droop. Can spontaneously move right side, LUE with drift, minimal movements of LLE (wiggles toes, ?secondary to pain following surgery).      MD Evaluation Airway: WNL Heart: WNL Abdomen: WNL Chest/ Lungs: WNL ASA  Classification: 3 Mallampati/Airway Score: Two   Imaging: DG Knee 2 Views Left  Result Date: 11/27/2020 CLINICAL DATA:  Pain following fall EXAM: LEFT KNEE - 1-2 VIEW COMPARISON:  None. FINDINGS: Frontal and lateral views obtained. No fracture or dislocation. No joint effusion. Joint spaces appear normal. No erosive change. IMPRESSION: No fracture, dislocation, or joint effusion. No evident arthropathy. Electronically Signed   By: Bretta Bang III M.D.   On: 11/27/2020 14:20   DG Abd 1 View  Result Date: 11/29/2020 CLINICAL DATA:  Nasogastric tube placement. EXAM: ABDOMEN - 1 VIEW COMPARISON:  None. FINDINGS: Nasogastric tube is seen entering the right mainstem bronchus with distal tip in the right  lower lobe. Immediate withdrawal is recommended. IMPRESSION: Nasogastric tube seen in right mainstem bronchus with distal tip in right lower lobe. Immediate removal is recommended. Electronically Signed   By: Lupita Raider M.D.   On: 11/29/2020 14:24   CT Head Wo Contrast  Result Date: 11/27/2020 CLINICAL DATA:  Fall on 11/23/2020. Diffuse left-sided pain. History of dementia. EXAM: CT HEAD WITHOUT CONTRAST CT CERVICAL SPINE WITHOUT CONTRAST TECHNIQUE: Multidetector CT imaging of the head and cervical spine was performed following the standard protocol without intravenous contrast. Multiplanar CT image reconstructions of the cervical spine were also generated. COMPARISON:  Head CT 09/17/2020. Head and neck CTA 09/17/2020. Head MRI 09/18/2020. FINDINGS: CT HEAD FINDINGS Brain: There is no evidence of an acute infarct, intracranial hemorrhage, mass, midline shift, or extra-axial fluid collection. Hypodensities in the cerebral white matter bilaterally are unchanged and nonspecific but compatible with chronic small vessel ischemic disease which is mild for age. Chronic lacunar infarcts in the right basal ganglia are unchanged. There is mild cerebral atrophy. Vascular: Calcified atherosclerosis at the skull base. Right MCA flow diverter device. Skull: No fracture or suspicious osseous lesion. Sinuses/Orbits: Small volume bubbly fluid in the left maxillary sinus. Persistent small left mastoid effusion. Right cataract extraction. Other: None. CT CERVICAL SPINE FINDINGS Alignment: Chronic mild reversal of the normal cervical lordosis. Unchanged trace retrolisthesis of C2 on C3 and C5 on C6 and grade 1 anterolisthesis of C4 on C5 and C7 on T1. Skull base and vertebrae: No acute fracture or suspicious osseous lesion. Moderate median C1-2 arthropathy. Soft tissues and spinal canal: No prevertebral fluid or swelling. No visible canal hematoma. Disc levels: Cervical disc degeneration most advanced at C5-6 where there is  severe disc space narrowing, degenerative endplate sclerosis and spurring, and vacuum disc. Mild disc degeneration at  other levels. Severe facet arthrosis on the right at C3-4, on the left at C4-5, and on the left at C7-T1. Mild-to-moderate multilevel neural foraminal stenosis. Upper chest: Clear lung apices. Other: None. IMPRESSION: 1. No evidence of acute intracranial abnormality. 2. Mild chronic small vessel ischemic disease. 3. No acute cervical spine fracture. Electronically Signed   By: Sebastian Ache M.D.   On: 11/27/2020 14:25   CT Cervical Spine Wo Contrast  Result Date: 11/27/2020 CLINICAL DATA:  Fall on 11/23/2020. Diffuse left-sided pain. History of dementia. EXAM: CT HEAD WITHOUT CONTRAST CT CERVICAL SPINE WITHOUT CONTRAST TECHNIQUE: Multidetector CT imaging of the head and cervical spine was performed following the standard protocol without intravenous contrast. Multiplanar CT image reconstructions of the cervical spine were also generated. COMPARISON:  Head CT 09/17/2020. Head and neck CTA 09/17/2020. Head MRI 09/18/2020. FINDINGS: CT HEAD FINDINGS Brain: There is no evidence of an acute infarct, intracranial hemorrhage, mass, midline shift, or extra-axial fluid collection. Hypodensities in the cerebral white matter bilaterally are unchanged and nonspecific but compatible with chronic small vessel ischemic disease which is mild for age. Chronic lacunar infarcts in the right basal ganglia are unchanged. There is mild cerebral atrophy. Vascular: Calcified atherosclerosis at the skull base. Right MCA flow diverter device. Skull: No fracture or suspicious osseous lesion. Sinuses/Orbits: Small volume bubbly fluid in the left maxillary sinus. Persistent small left mastoid effusion. Right cataract extraction. Other: None. CT CERVICAL SPINE FINDINGS Alignment: Chronic mild reversal of the normal cervical lordosis. Unchanged trace retrolisthesis of C2 on C3 and C5 on C6 and grade 1 anterolisthesis of C4 on C5  and C7 on T1. Skull base and vertebrae: No acute fracture or suspicious osseous lesion. Moderate median C1-2 arthropathy. Soft tissues and spinal canal: No prevertebral fluid or swelling. No visible canal hematoma. Disc levels: Cervical disc degeneration most advanced at C5-6 where there is severe disc space narrowing, degenerative endplate sclerosis and spurring, and vacuum disc. Mild disc degeneration at other levels. Severe facet arthrosis on the right at C3-4, on the left at C4-5, and on the left at C7-T1. Mild-to-moderate multilevel neural foraminal stenosis. Upper chest: Clear lung apices. Other: None. IMPRESSION: 1. No evidence of acute intracranial abnormality. 2. Mild chronic small vessel ischemic disease. 3. No acute cervical spine fracture. Electronically Signed   By: Sebastian Ache M.D.   On: 11/27/2020 14:25   MR BRAIN WO CONTRAST  Result Date: 11/29/2020 CLINICAL DATA:  Neuro deficit, acute, stroke suspected. Altered mental status. EXAM: MRI HEAD WITHOUT CONTRAST TECHNIQUE: Multiplanar, multiecho pulse sequences of the brain and surrounding structures were obtained without intravenous contrast. COMPARISON:  Head CT November 29, 2020 FINDINGS: Brain: Area of restricted diffusion involving the posterior aspect of the right insula and right frontoparietal region, consistent with acute/subacute infarct, corresponding to area of increased T-max on prior CT perfusion. No hemorrhage, hydrocephalus, extra-axial collection or mass lesion. Remote infarct in the right basal ganglia region. Scattered and confluent foci of T2 hyperintensity are seen within the white matter of cerebral hemispheres, nonspecific, most likely related to chronic small vessel ischemia. Susceptibility artifact in the anterior right sylvian fissure related to right M1/MCA stent. Vascular: Normal flow voids. Skull and upper cervical spine: Normal marrow signal. Sinuses/Orbits: Mild mucosal thickening of the left maxillary sinus with small  fluid level. Right lens surgery. Other: Bilateral mastoid effusion. IMPRESSION: 1. Area of restricted diffusion involving the posterior aspect of the right insula and right frontoparietal region, corresponding to area of increased T-max on prior  CT perfusion, consistent with acute/subacute infarct. 2. Remote infarct in the right basal ganglia region. 3. Moderate chronic small vessel ischemia. 4. Left maxillary sinus disease with small fluid level. Correlate for acute sinusitis. 5. Bilateral mastoid effusion. Electronically Signed   By: Baldemar Lenis M.D.   On: 11/29/2020 16:20   Pelvis Portable  Result Date: 11/28/2020 CLINICAL DATA:  Postop. EXAM: PORTABLE PELVIS 1-2 VIEWS COMPARISON:  Preoperative radiograph yesterday. FINDINGS: Three screws traverse left femoral neck fracture. Slightly improved fracture alignment from prior exam. No periprosthetic lucency. Bones diffusely under mineralized. No additional fracture. IMPRESSION: Three screws traverse left femoral neck fracture. No immediate postoperative complication. Electronically Signed   By: Narda Rutherford M.D.   On: 11/28/2020 18:54   CT Hip Left Wo Contrast  Result Date: 11/27/2020 CLINICAL DATA:  Left hip pain after falling today. Abnormal radiographs with questionable left femoral head fracture. EXAM: CT OF THE LEFT HIP WITHOUT CONTRAST TECHNIQUE: Multidetector CT imaging of the left hip was performed according to the standard protocol. Multiplanar CT image reconstructions were also generated. COMPARISON:  Radiographs 11/27/2020 and 09/17/2020. Pelvic CT 09/17/2020. FINDINGS: Bones/Joint/Cartilage There is a new mildly impacted subcapital fracture of the left femoral neck, best seen on the reformatted images. This demonstrates up to 6 mm of inferior medial displacement on coronal image 33/3. There are underlying degenerative changes of the left hip with joint space narrowing and femoral head osteophytes. No dislocation or fracture of  the left hemipelvis. There is a small left hip joint effusion. Ligaments Suboptimally assessed by CT. Muscles and Tendons Unremarkable. Soft tissues Mild periarticular soft tissue swelling without focal hematoma or foreign body. Iliofemoral atherosclerosis and diffuse sigmoid diverticulosis are noted. IMPRESSION: 1. Acute mildly impacted and displaced fracture of the left femoral neck. 2. Underlying moderate left hip osteoarthritis. 3. No periarticular hematoma. Electronically Signed   By: Carey Bullocks M.D.   On: 11/27/2020 15:21   CT CEREBRAL PERFUSION W CONTRAST  Result Date: 11/29/2020 CLINICAL DATA:  Acute stroke suspected. EXAM: CT HEAD WITHOUT CONTRAST CT ANGIOGRAPHY HEAD AND NECK CT PERFUSION BRAIN TECHNIQUE: Multidetector CT imaging of the head and neck was performed using the standard protocol during bolus administration of intravenous contrast. Multiplanar CT image reconstructions and MIPs were obtained to evaluate the vascular anatomy. Carotid stenosis measurements (when applicable) are obtained utilizing NASCET criteria, using the distal internal carotid diameter as the denominator. Multiphase CT imaging of the brain was performed following IV bolus contrast injection. Subsequent parametric perfusion maps were calculated using RAPID software. CONTRAST:  60mL OMNIPAQUE IOHEXOL 350 MG/ML SOLN; 40mL OMNIPAQUE IOHEXOL 350 MG/ML SOLN COMPARISON:  CTA 09/17/2020 FINDINGS: CTA NECK FINDINGS Aortic arch: Brachiocephalic and common carotid origins are not imaged. Otherwise, patent great vessels. Right carotid system: No evidence of dissection, significant stenosis (50% or greater) or occlusion. Left carotid system: Left common carotid artery origin is not imaged. No visible dissection, significant stenosis (50% or greater), or occlusion. Vertebral arteries: Codominant. No evidence of dissection, stenosis (50% or greater) or occlusion. Similar multifocal mild stenosis of the right V1 vertebral artery.  Skeleton: No evidence of acute bony abnormality on limited assessment. Similar alignment. Similar multilevel degenerative change, including moderate degenerative disc disease at C5-C6 with disc height loss, vacuum disc phenomenon and posterior disc osteophyte complex. Other neck: No mass or adenopathy. Upper chest: Emphysema.  Visualized lung apices are clear. Review of the MIP images confirms the above findings CTA HEAD FINDINGS Anterior circulation: Patent bilateral internal carotid arteries with  similar calcific atherosclerosis. No evidence of greater than 50% stenosis of the ICAs. Redemonstrated fluid over device within the right M1 MCA. Artifact limits evaluation for in stent stenosis; however, there is flow proximal and distal to the stent, compatible with patency. 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 Left MCA and ACAs are patent. Similar 1-2 mm vascular protrusion projecting anteriorly from the anterior communicating artery Posterior circulation: No evidence large vessel occlusion or proximal hemodynamically significant stenosis. Similar right posterior communicating artery. Venous sinuses: As permitted by contrast timing, patent. Review of the MIP images confirms the above findings CT Brain Perfusion Findings: CBF (<30%) Volume: 59mL Perfusion (Tmax>6.0s) volume: 70mL Mismatch Volume: 75mL in the posterior right MCA territory, which likely is in the territory supplied by the M2 MCA stenosis described above. Infarction Location:None. IMPRESSION: 1. 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 2. Approximately 15 mm area penumbra/mismatch in the posterior right MCA territory, which likely is in the territory supplied by the M2 MCA stenosis described above. No core infarct identified. 3. Streak artifact limits  evaluation for right MCA in-stent stenosis; however, there is opacification proximal and distal to the stent, similar to prior and consistent with stent patency. 4. Redemonstrated flow diverter device within the M1 right MCA. 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 5. Similar 1-2 mm anteriorly projecting outpouching arising from the anterior communicating artery, which may represent a small aneurysm versus infundibulum with small associated vessel not seen. CTA findings were communicated on 11/29/2020 at 12:08 pm to provider Dr. Otelia Limes via telephones. Perfusion findings communicated on 11/29/2020 at 12:19 pm to provider Dr. Otelia Limes via telephone. Electronically Signed   By: Feliberto Harts MD   On: 11/29/2020 12:31   DG CHEST PORT 1 VIEW  Result Date: 11/29/2020 CLINICAL DATA:  Postoperative hip fracture repair.  Cough. EXAM: PORTABLE CHEST 1 VIEW COMPARISON:  November 27, 2020 FINDINGS: There is consolidation in the left lower lobe. There is ill-defined patchy airspace opacity in the right base. The heart size and pulmonary vascularity are normal. No adenopathy. No bone lesions. Contrast noted in each renal collecting system. IMPRESSION: Airspace consolidation with volume loss left lower lung region. Patchy infiltrate right base. Suspect bibasilar pneumonia. Underlying aspiration is possible. Heart size normal. Electronically Signed   By: Bretta Bang III M.D.   On: 11/29/2020 16:46   DG Chest Portable 1 View  Result Date: 11/27/2020 CLINICAL DATA:  Left hip fracture. EXAM: PORTABLE CHEST 1 VIEW COMPARISON:  November 18, 2020. FINDINGS: The heart size and mediastinal contours are within normal limits. No pneumothorax or pleural effusion is noted. Mild bibasilar subsegmental atelectasis is noted. The visualized skeletal structures are unremarkable. IMPRESSION: Mild bibasilar subsegmental atelectasis. Electronically Signed   By: Lupita Raider M.D.   On: 11/27/2020 16:45   DG Chest Portable 1  View  Result Date: 11/18/2020 CLINICAL DATA:  Shortness of breath. EXAM: PORTABLE CHEST 1 VIEW COMPARISON:  September 18, 2020. FINDINGS: The heart size and mediastinal contours are within normal limits. No pneumothorax is noted. Mild bibasilar atelectasis or infiltrates are noted. Small left pleural effusion is noted. The visualized skeletal structures are unremarkable. IMPRESSION: Mild bibasilar atelectasis or infiltrates are noted with small left pleural effusion. Followup PA and lateral chest X-ray is recommended in 3-4 weeks following  trial of antibiotic therapy to ensure resolution and exclude underlying malignancy. Electronically Signed   By: Lupita Raider M.D.   On: 11/18/2020 14:35   DG C-Arm 1-60 Min-No Report  Result Date: 11/28/2020 Fluoroscopy was utilized by the requesting physician.  No radiographic interpretation.   ECHOCARDIOGRAM COMPLETE  Result Date: 11/19/2020    ECHOCARDIOGRAM REPORT   Patient Name:   NOVEMBER SYPHER Date of Exam: 11/19/2020 Medical Rec #:  [id number]      Height:       63.0 in Accession #:    [id number]     Weight:       101.6 lb Date of Birth:  Sep 24, 1940       BSA:          1.450 m Patient Age:    79 years       BP:           136/82 mmHg Patient Gender: F              HR:           111 bpm. Exam Location:  Inpatient Procedure: 2D Echo, 3D Echo, Cardiac Doppler and Color Doppler Indications:    I48.91* Unspeicified atrial fibrillation  History:        Patient has prior history of Echocardiogram examinations, most                 recent 01/29/2020. Abnormal ECG and AV block, COPD and Stroke;                 Risk Factors:Current Smoker and Hypertension.  Sonographer:    Sheralyn Boatman RDCS Referring Phys: [id number] STEPHEN K CHIU IMPRESSIONS  1. Left ventricular ejection fraction, by estimation, is 65 to 70%. The left ventricle has normal function. The left ventricle has no regional wall motion abnormalities. Left ventricular diastolic parameters are indeterminate.  2. Right  ventricular systolic function is normal. The right ventricular size is normal. There is moderately elevated pulmonary artery systolic pressure.  3. A small pericardial effusion is present. The pericardial effusion is circumferential. There is no evidence of cardiac tamponade.  4. The mitral valve is normal in structure. Trivial mitral valve regurgitation. No evidence of mitral stenosis.  5. The aortic valve is normal in structure. Aortic valve regurgitation is trivial. No aortic stenosis is present.  6. The inferior vena cava is dilated in size with >50% respiratory variability, suggesting right atrial pressure of 8 mmHg. FINDINGS  Left Ventricle: Left ventricular ejection fraction, by estimation, is 65 to 70%. The left ventricle has normal function. The left ventricle has no regional wall motion abnormalities. The left ventricular internal cavity size was normal in size. There is  no left ventricular hypertrophy. Left ventricular diastolic parameters are indeterminate. Right Ventricle: The right ventricular size is normal. No increase in right ventricular wall thickness. Right ventricular systolic function is normal. There is moderately elevated pulmonary artery systolic pressure. The tricuspid regurgitant velocity is 2.94 m/s, and with an assumed right atrial pressure of 15 mmHg, the estimated right ventricular systolic pressure is 49.6 mmHg. Left Atrium: Left atrial size was normal in size. Right Atrium: Right atrial size was normal in size. Pericardium: A small pericardial effusion is present. The pericardial effusion is circumferential. There is no evidence of cardiac tamponade. Mitral Valve: The mitral valve is normal in structure. Trivial mitral valve regurgitation. No evidence of mitral valve stenosis. Tricuspid Valve: The tricuspid valve is normal in structure. Tricuspid valve regurgitation  is mild . No evidence of tricuspid stenosis. Aortic Valve: The aortic valve is normal in structure. Aortic valve  regurgitation is trivial. No aortic stenosis is present. Pulmonic Valve: The pulmonic valve was normal in structure. Pulmonic valve regurgitation is not visualized. No evidence of pulmonic stenosis. Aorta: The aortic root is normal in size and structure. Venous: The inferior vena cava is dilated in size with greater than 50% respiratory variability, suggesting right atrial pressure of 8 mmHg. IAS/Shunts: No atrial level shunt detected by color flow Doppler.  LEFT VENTRICLE PLAX 2D LVIDd:         3.00 cm LVIDs:         1.70 cm LV PW:         0.99 cm LV IVS:        1.20 cm LVOT diam:     1.90 cm LV SV:         42 LV SV Index:   29 LVOT Area:     2.84 cm  LV Volumes (MOD) LV vol d, MOD A2C: 31.5 ml LV vol d, MOD A4C: 27.1 ml LV vol s, MOD A2C: 11.1 ml LV vol s, MOD A4C: 9.3 ml LV SV MOD A2C:     20.4 ml LV SV MOD A4C:     27.1 ml LV SV MOD BP:      20.6 ml RIGHT VENTRICLE            IVC RV S prime:     9.79 cm/s  IVC diam: 2.20 cm TAPSE (M-mode): 3.4 cm LEFT ATRIUM             Index       RIGHT ATRIUM           Index LA diam:        4.40 cm 3.03 cm/m  RA Area:     10.20 cm LA Vol (A2C):   30.5 ml 21.03 ml/m RA Volume:   15.60 ml  10.76 ml/m LA Vol (A4C):   23.2 ml 16.00 ml/m LA Biplane Vol: 25.8 ml 17.79 ml/m  AORTIC VALVE LVOT Vmax:   87.20 cm/s LVOT Vmean:  58.500 cm/s LVOT VTI:    0.149 m  AORTA Ao Root diam: 3.10 cm MITRAL VALVE               TRICUSPID VALVE MV Area (PHT): 4.07 cm    TR Peak grad:   34.6 mmHg MV Decel Time: 187 msec    TR Vmax:        294.00 cm/s MV E velocity: 92.15 cm/s                            SHUNTS                            Systemic VTI:  0.15 m                            Systemic Diam: 1.90 cm Chilton Si MD Electronically signed by Chilton Si MD Signature Date/Time: 11/19/2020/4:30:21 PM    Final    DG Hip Port Unilat With Pelvis 1V Left  Result Date: 11/28/2020 CLINICAL DATA:  Post left hip pinning EXAM: DG HIP (WITH OR WITHOUT PELVIS) 1V PORT LEFT COMPARISON:  None.  FINDINGS: Screws within the left femoral neck. No complicating feature. Mild degenerative changes  in the left hip. IMPRESSION: Pinning of the left hip.  No visible complicating feature. Electronically Signed   By: Charlett Nose M.D.   On: 11/28/2020 18:04   DG HIP OPERATIVE UNILAT W OR W/O PELVIS LEFT  Result Date: 11/28/2020 CLINICAL DATA:  Left hip pinning. EXAM: OPERATIVE LEFT HIP (WITH PELVIS IF PERFORMED) TECHNIQUE: Fluoroscopic spot image(s) were submitted for interpretation post-operatively. COMPARISON:  Preoperative radiograph yesterday. FINDINGS: Three fluoroscopic spot views obtained of the left hip in the operating room in frontal and lateral projections. Two screws traverse femoral neck fracture. Total fluoroscopy time 43 seconds. IMPRESSION: Procedural fluoroscopy after left hip fracture pinning. Electronically Signed   By: Narda Rutherford M.D.   On: 11/28/2020 18:52   DG Hip Unilat W or Wo Pelvis 2-3 Views Left  Result Date: 11/27/2020 CLINICAL DATA:  Pain following fall EXAM: DG HIP (WITH OR WITHOUT PELVIS) 2-3V LEFT COMPARISON:  None. FINDINGS: Frontal pelvis as well as frontal and lateral views of the left hip joint were obtained. There is a subtle lucency in the medial aspect of the left femoral head, concerning for potential incomplete fracture in this area. No other findings suggesting potential fracture. No dislocation. There is mild symmetric narrowing of each hip joint. No erosive change. IMPRESSION: Questionable incomplete fracture in the medial aspect of the left femoral head. This appearance warrants further assessment with CT or MR of the left hip to further evaluate. MR would be the optimum study of choice to further evaluate, given ability to demonstrate marrow edema with MR. No other findings suggesting potential fracture. No dislocation. Mild symmetric narrowing each hip joint. Electronically Signed   By: Bretta Bang III M.D.   On: 11/27/2020 14:19   CT HEAD CODE STROKE  WO CONTRAST  Result Date: 11/29/2020 CLINICAL DATA:  Code stroke. Acute neuro deficit. Rule out stroke. Left-sided weakness. EXAM: CT HEAD WITHOUT CONTRAST TECHNIQUE: Contiguous axial images were obtained from the base of the skull through the vertex without intravenous contrast. COMPARISON:  CT head 11/27/2020 FINDINGS: Brain: Generalized atrophy. Chronic microvascular ischemic change in the white matter. Chronic lacunar infarction in the right head of caudate and adjacent internal capsule, unchanged. Negative for acute infarct, hemorrhage, mass. Vascular: Negative for hyperdense vessel. Stent in the right M1 segment unchanged. Skull: Negative Sinuses/Orbits: Air-fluid level left maxillary sinus. Remaining sinuses clear. Right cataract extraction. No orbital mass. Other: Image quality degraded by motion. ASPECTS [facility] Stroke Program Early CT Score) - Ganglionic level infarction (caudate, lentiform nuclei, internal capsule, insula, M1-M3 cortex): 7 - Supraganglionic infarction (M4-M6 cortex): 3 Total score (0-10 with 10 being normal): 10 IMPRESSION: 1. No acute abnormality and no change from 2 days prior 2. ASPECTS is 10 3. Atrophy and chronic ischemic changes, stable from the prior study. 4. These results were called by telephone at the time of interpretation on 11/29/2020 at 11:56 am to provider A Shaune Spittle , who verbally acknowledged these results. Electronically Signed   By: Marlan Palau M.D.   On: 11/29/2020 11:57   CT ANGIO HEAD NECK W WO CM (CODE STROKE)  Result Date: 11/29/2020 CLINICAL DATA:  Acute stroke suspected. EXAM: CT HEAD WITHOUT CONTRAST CT ANGIOGRAPHY HEAD AND NECK CT PERFUSION BRAIN TECHNIQUE: Multidetector CT imaging of the head and neck was performed using the standard protocol during bolus administration of intravenous contrast. Multiplanar CT image reconstructions and MIPs were obtained to evaluate the vascular anatomy. Carotid stenosis measurements (when applicable) are obtained  utilizing NASCET criteria, using the distal  internal carotid diameter as the denominator. Multiphase CT imaging of the brain was performed following IV bolus contrast injection. Subsequent parametric perfusion maps were calculated using RAPID software. CONTRAST:  60mL OMNIPAQUE IOHEXOL 350 MG/ML SOLN; 40mL OMNIPAQUE IOHEXOL 350 MG/ML SOLN COMPARISON:  CTA 09/17/2020 FINDINGS: CTA NECK FINDINGS Aortic arch: Brachiocephalic and common carotid origins are not imaged. Otherwise, patent great vessels. Right carotid system: No evidence of dissection, significant stenosis (50% or greater) or occlusion. Left carotid system: Left common carotid artery origin is not imaged. No visible dissection, significant stenosis (50% or greater), or occlusion. Vertebral arteries: Codominant. No evidence of dissection, stenosis (50% or greater) or occlusion. Similar multifocal mild stenosis of the right V1 vertebral artery. Skeleton: No evidence of acute bony abnormality on limited assessment. Similar alignment. Similar multilevel degenerative change, including moderate degenerative disc disease at C5-C6 with disc height loss, vacuum disc phenomenon and posterior disc osteophyte complex. Other neck: No mass or adenopathy. Upper chest: Emphysema.  Visualized lung apices are clear. Review of the MIP images confirms the above findings CTA HEAD FINDINGS Anterior circulation: Patent bilateral internal carotid arteries with similar calcific atherosclerosis. No evidence of greater than 50% stenosis of the ICAs. Redemonstrated fluid over device within the right M1 MCA. Artifact limits evaluation for in stent stenosis; however, there is flow proximal and distal to the stent, compatible with patency. 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 Left MCA and ACAs are patent. Similar 1-2 mm vascular protrusion projecting anteriorly from the anterior  communicating artery Posterior circulation: No evidence large vessel occlusion or proximal hemodynamically significant stenosis. Similar right posterior communicating artery. Venous sinuses: As permitted by contrast timing, patent. Review of the MIP images confirms the above findings CT Brain Perfusion Findings: CBF (<30%) Volume: 0mL Perfusion (Tmax>6.0s) volume: 15mL Mismatch Volume: 15mL in the posterior right MCA territory, which likely is in the territory supplied by the M2 MCA stenosis described above. Infarction Location:None. IMPRESSION: 1. 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 2. Approximately 15 mm area penumbra/mismatch in the posterior right MCA territory, which likely is in the territory supplied by the M2 MCA stenosis described above. No core infarct identified. 3. Streak artifact limits evaluation for right MCA in-stent stenosis; however, there is opacification proximal and distal to the stent, similar to prior and consistent with stent patency. 4. Redemonstrated flow diverter device within the M1 right MCA. 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 5. Similar 1-2 mm anteriorly projecting outpouching arising from the anterior communicating artery, which may represent a small aneurysm versus infundibulum with small associated vessel not seen. CTA findings were communicated on 11/29/2020 at 12:08 pm to provider Dr. Otelia LimesLindzen via telephones. Perfusion findings communicated on 11/29/2020 at 12:19 pm to provider Dr. Otelia LimesLindzen via telephone. Electronically Signed   By: Feliberto HartsFrederick S Jones MD   On: 11/29/2020 12:31    Labs:  CBC: Recent Labs    11/22/20 0509 11/27/20 1313 11/28/20 0826 11/29/20 0409  WBC 9.2 17.0* 18.6* 18.0*  HGB 11.0* 12.4 11.6* 10.2*  HCT 33.6* 37.3 35.0* 32.1*  PLT 492* 617* 528* 459*    COAGS: Recent Labs    01/29/20 0835  INR 1.3*    BMP: Recent Labs    02/07/20 0323  02/08/20 0244 02/09/20 0248 09/17/20 1324 10/01/20 1156 11/18/20 1429 11/22/20  1610 11/27/20 1313 11/28/20 0826 11/29/20 0409  NA 136 137 136   < > 142   < > 136 135 138 134*  K 3.4* 4.4 4.1   < > 4.1   < > 3.9 3.5 3.5 4.2  CL 101 104 100   < > 104   < > 102 98 101 99  CO2 27 27 28    < > 23   < > 26 23 25 24   GLUCOSE 91 95 99   < > 83   < > 97 132* 107* 98  BUN 7* 7* 9   < > 14   < > 9 18 20 20   CALCIUM 8.5* 8.9 8.8*   < > 9.2   < > 9.4 9.7 9.6 9.3  CREATININE 0.99 1.01* 1.17*   < > 1.05*   < > 0.86 0.98 0.87 0.85  GFRNONAA 55* 53* 45*   < > 51*   < > >60 59* >60 >60  GFRAA >60 >60 52*  --  59*  --   --   --   --   --    < > = values in this interval not displayed.    LIVER FUNCTION TESTS: Recent Labs    09/19/20 0411 09/20/20 0737 11/18/20 1429 11/20/20 0629  BILITOT 0.5 0.7 0.3 0.3  AST 31 24 20 16   ALT 17 14 15 11   ALKPHOS 65 68 100 70  PROT 5.8* 5.8* 8.1 6.3*  ALBUMIN 2.5* 2.6* 2.7* 2.2*     Assessment and Plan:  CVA (acute/subacute infarct of the posterior aspect of the right insula and right frontoparietal region) in setting of right MCA M2 high-grade stenosis. Plan for image-guided diagnostic cerebral arteriogram in IR with Dr. [name] tentatively for Monday 12/02/2020 pending IR scheduling. Patient will be NPO at midnight prior to procedure. Afebrile. Ok to proceed with Plavix/Aspirin use per Dr. [name]; will hold Lovenox per IR protocol. INR pending.  Risks and benefits of diagnostic cerebral angiogram were discussed with the patient including, but not limited to bleeding, infection, vascular injury, stroke, or contrast induced renal failure. This interventional procedure involves the use of X-rays and because of the nature of the planned procedure, it is possible that we will have prolonged use of X-ray fluoroscopy. Potential radiation risks to you include (but are not limited to) the following: - A slightly elevated risk for cancer  several years  later in life. This risk is typically less than 0.5% percent. This risk is low in comparison to the normal incidence of human cancer, which is 33% for women and 50% for men according to the [employer] Society. - Radiation induced injury can include skin redness, resembling a rash, tissue breakdown / ulcers and hair loss (which can be temporary or permanent).  The likelihood of either of these occurring depends on the difficulty of the procedure and whether you are sensitive to radiation due to previous procedures, disease, or genetic conditions.  IF your procedure requires a prolonged use of radiation, you will be notified and given written instructions for further action.  It is your responsibility to monitor the irradiated area for the 2 weeks following the procedure and to notify your physician if you are concerned that you have suffered a radiation induced injury.   All of the patient's daughter's questions were answered, she is agreeable to proceed. Consent obtained by patient's daughter, Netta Corrigan, via telephone- signed and in IR PA office.   Thank you for this  interesting consult.  I greatly enjoyed meeting Barbara Hill and look forward to participating in their care.  A copy of this report was sent to the requesting provider on this date.  Electronically Signed: Elwin Mocha, PA-C 11/30/2020, 9:17 AM   I spent a total of 40 Minutes in face to face in clinical consultation, greater than 50% of which was counseling/coordinating care for CVA/diagnostic cerebral arteriogram.

## 2020-12-01 ENCOUNTER — Inpatient Hospital Stay (HOSPITAL_COMMUNITY): Payer: Medicare Other

## 2020-12-01 DIAGNOSIS — I10 Essential (primary) hypertension: Secondary | ICD-10-CM | POA: Diagnosis not present

## 2020-12-01 DIAGNOSIS — Z8673 Personal history of transient ischemic attack (TIA), and cerebral infarction without residual deficits: Secondary | ICD-10-CM

## 2020-12-01 DIAGNOSIS — N1831 Chronic kidney disease, stage 3a: Secondary | ICD-10-CM | POA: Diagnosis not present

## 2020-12-01 DIAGNOSIS — S72002D Fracture of unspecified part of neck of left femur, subsequent encounter for closed fracture with routine healing: Secondary | ICD-10-CM | POA: Diagnosis not present

## 2020-12-01 DIAGNOSIS — Z515 Encounter for palliative care: Secondary | ICD-10-CM | POA: Diagnosis not present

## 2020-12-01 LAB — LIPID PANEL
Cholesterol: 132 mg/dL (ref 0–200)
HDL: 49 mg/dL (ref >40)
LDL Cholesterol: 69 mg/dL (ref 0–99)
Total CHOL/HDL Ratio: 2.7 RATIO
Triglycerides: 72 mg/dL (ref <150)
VLDL: 14 mg/dL (ref 0–40)

## 2020-12-01 LAB — BASIC METABOLIC PANEL
Anion gap: 8 (ref 5–15)
BUN: 8 mg/dL (ref 8–23)
CO2: 28 mmol/L (ref 22–32)
Calcium: 8.7 mg/dL — ABNORMAL LOW (ref 8.9–10.3)
Chloride: 99 mmol/L (ref 98–111)
Creatinine, Ser: 0.64 mg/dL (ref 0.44–1.00)
GFR, Estimated: >60 mL/min (ref >60)
Glucose, Bld: 98 mg/dL (ref 70–99)
Potassium: 2.6 mmol/L — CL (ref 3.5–5.1)
Sodium: 135 mmol/L (ref 135–145)

## 2020-12-01 LAB — IRON AND TIBC
Iron: 27 ug/dL — ABNORMAL LOW (ref 28–170)
Saturation Ratios: 15 % (ref 10.4–31.8)
TIBC: 185 ug/dL — ABNORMAL LOW (ref 250–450)
UIBC: 158 ug/dL

## 2020-12-01 LAB — HEMOGLOBIN A1C
Hgb A1c MFr Bld: 5.7 % — ABNORMAL HIGH (ref 4.8–5.6)
Mean Plasma Glucose: 116.89 mg/dL

## 2020-12-01 LAB — FOLATE: Folate: 28.5 ng/mL (ref >5.9)

## 2020-12-01 LAB — RETICULOCYTES
Immature Retic Fract: 10.9 % (ref 2.3–15.9)
RBC.: 3.73 MIL/uL — ABNORMAL LOW (ref 3.87–5.11)
Retic Count, Absolute: 65.6 10*3/uL (ref 19.0–186.0)
Retic Ct Pct: 1.8 % (ref 0.4–3.1)

## 2020-12-01 LAB — FERRITIN: Ferritin: 409 ng/mL — ABNORMAL HIGH (ref 11–307)

## 2020-12-01 LAB — PROTIME-INR
INR: 1.8 — ABNORMAL HIGH (ref 0.8–1.2)
Prothrombin Time: 19.8 seconds — ABNORMAL HIGH (ref 11.4–15.2)

## 2020-12-01 LAB — VITAMIN B12: Vitamin B-12: 1379 pg/mL — ABNORMAL HIGH (ref 180–914)

## 2020-12-01 MED ORDER — MAGNESIUM SULFATE 4 GM/100ML IV SOLN
4.0000 g | Freq: Once | INTRAVENOUS | Status: AC
  Administered 2020-12-01: 4 g via INTRAVENOUS
  Filled 2020-12-01: qty 100

## 2020-12-01 MED ORDER — POTASSIUM CHLORIDE CRYS ER 20 MEQ PO TBCR
40.0000 meq | EXTENDED_RELEASE_TABLET | Freq: Two times a day (BID) | ORAL | Status: DC
  Administered 2020-12-01 (×2): 40 meq via ORAL
  Filled 2020-12-01 (×3): qty 2

## 2020-12-01 MED ORDER — POTASSIUM CHLORIDE 10 MEQ/100ML IV SOLN
10.0000 meq | Freq: Once | INTRAVENOUS | Status: AC
  Administered 2020-12-01: 10 meq via INTRAVENOUS

## 2020-12-01 MED ORDER — POTASSIUM CHLORIDE 10 MEQ/100ML IV SOLN
10.0000 meq | INTRAVENOUS | Status: DC
  Administered 2020-12-01 (×3): 10 meq via INTRAVENOUS
  Filled 2020-12-01 (×4): qty 100

## 2020-12-01 MED ORDER — PANTOPRAZOLE SODIUM 40 MG PO TBEC
40.0000 mg | DELAYED_RELEASE_TABLET | Freq: Every day | ORAL | Status: DC
  Administered 2020-12-01 – 2020-12-04 (×4): 40 mg via ORAL
  Filled 2020-12-01 (×4): qty 1

## 2020-12-01 NOTE — Progress Notes (Signed)
  Speech Language Pathology Treatment: Dysphagia  Patient Details Name: Barbara Hill MRN: [id number] DOB: 02-18-1941 Today's Date: 12/01/2020 Time: 4196-2229 SLP Time Calculation (min) (ACUTE ONLY): 15 min  Assessment / Plan / Recommendation Clinical Impression  Patient seen to address swallow function goals and determine if she is appropriate for MBS today. BSE was completed previous date with recommendation of Dys 1 solids, nectar thick liquids and MBS next date (today, 4/10). SLP completed SLE during today's session and concern was for patient's ability to adequately follow directions and participate in MBS testing. Patient requested "Pepsi" and SLP first attempted to have patient hold cup but she required maxA and started to squeeze the cup and not bring to her mouth. SLP then gave patient spoon sips of thin liquids (Pepsi) and administered 4 total. Patient did exhibit some delayed mild coughing/congestion but with voice remaining clear. SLP determined that patient would not be able to adequately participate in [facility] and so this was deferred. In addition, Palliative care has been following patient and discussed with patient's family plan to see how she does in next 2-3 days and if she starts to decline in function, would likely discharge home on hospice. SLP plans to check in with patient and MD next date to review GOC and determine if benefit from [facility] or if patient to transition to comfort PO's. If patient to go comfort route, SLP is recommending to allow patient to have thin liquids so she can have sips of Pepsi which she enjoys.    HPI HPI: 80 year old female with past medical history of dementia, anxiety, depression, s/p R MCA stent in 2017 complicated by CVA, PVD s/p embolectomy for popliteal occlusions, CKD 3, cerebral aneurysm, COPD, GERD, paroxysmal SVT, orthostatic hypotension, mitral valve prolapse, recent hospitalization from 3/28-4/1 for CAP who presented to [facility] for evaluation of knee pain  following a fall on April 2.  Per chart, she was found to have a left hip fracture and is now status post surgery with ortho on 4/7.  Developed delirium with left upper extremity weakness on 4/8 and code stroke was called.  MRI revealed Area of restricted diffusion involving the posterior aspect of the right insula and right frontoparietal region, corresponding to area of increased T-max on prior CT perfusion, consistent with acute/subacute infarct, remote infarct in the right basal ganglia region, moderate chronic small vessel ischemia.      SLP Plan  Continue with current plan of care  Patient does not need any further Speech Lanaguage Pathology Services (No ST services recommended for speech-language cognitive but continue with dysphagia tx)    Recommendations  Diet recommendations: Dysphagia 1 (puree);Nectar-thick liquid Liquids provided via: Cup;Teaspoon Medication Administration: Crushed with puree Supervision: Full supervision/cueing for compensatory strategies;Staff to assist with self feeding Compensations: Minimize environmental distractions;Slow rate;Small sips/bites Postural Changes and/or Swallow Maneuvers: Seated upright 90 degrees                Oral Care Recommendations: Oral care BID Follow up Recommendations: Skilled Nursing facility SLP Visit Diagnosis: Dysphagia, unspecified (R13.10) Plan: Continue with current plan of care       GO                Angela Nevin, MA, CCC-SLP Speech Therapy [facility] Acute Rehab

## 2020-12-01 NOTE — Progress Notes (Signed)
SPORTS MEDICINE AND JOINT REPLACEMENT  Georgena Spurling, MD    Laurier Nancy, PA-C [address] [place], [place], [place]  [address]                             [phone]   PROGRESS NOTE  Subjective:  negative for Chest Pain  negative for Shortness of Breath  negative for Nausea/Vomiting   negative for Calf Pain  negative for Bowel Movement   Tolerating Diet: yes         Patient reports pain as 4 on 0-10 scale.    Objective: Vital signs in last 24 hours:   Patient Vitals for the past 24 hrs:  BP Temp Temp src Pulse Resp SpO2  12/01/20 0845 -- -- -- -- -- 93 %  12/01/20 0757 (!) 176/67 97.9 F (36.6 C) -- 67 18 93 %  12/01/20 0400 (!) 178/78 98.3 F (36.8 C) -- 97 17 92 %  11/30/20 2342 (!) 179/80 98.2 F (36.8 C) Oral 83 18 93 %  11/30/20 1948 (!) 142/59 98.3 F (36.8 C) Oral 89 17 96 %  11/30/20 1532 (!) 157/68 98.4 F (36.9 C) Oral 79 16 94 %  11/30/20 1150 -- -- -- -- -- 95 %  11/30/20 1143 (!) 134/58 98.2 F (36.8 C) Oral 79 14 95 %    @flow {1959:LAST@   Intake/Output from previous day:   04/09 0701 - 04/10 0700 In: -  Out: 450 [Urine:450]   Intake/Output this shift:   No intake/output data recorded.   Intake/Output      04/09 0701 04/10 0700 04/10 0701 04/11 0700   P.O.     I.V. (mL/kg)     IV Piggyback     Total Intake(mL/kg)     Urine (mL/kg/hr) 450 (0.4)    Emesis/NG output     Stool     Total Output 450    Net -450            LABORATORY DATA: Recent Labs    11/27/20 1313 11/28/20 0826 11/29/20 0409 11/30/20 1239  WBC 17.0* 18.6* 18.0* 13.4*  HGB 12.4 11.6* 10.2* 9.7*  HCT 37.3 35.0* 32.1* 28.8*  PLT 617* 528* 459* 377   Recent Labs    11/27/20 1313 11/28/20 0826 11/29/20 0409 11/30/20 1239 12/01/20 0237  NA 135 138 134* 134* 135  K 3.5 3.5 4.2 2.7* 2.6*  CL 98 101 99 101 99  CO2 23 25 24 27 28   BUN 18 20 20 9 8   CREATININE 0.98 0.87 0.85 0.65 0.64  GLUCOSE 132* 107* 98 138* 98  CALCIUM 9.7 9.6 9.3 8.8* 8.7*   Lab  Results  Component Value Date   INR 1.8 (H) 12/01/2020   INR 1.3 (H) 01/29/2020   INR 1.11 11/12/2016    Examination:  General appearance: alert and cooperative Extremities: extremities normal, atraumatic, no cyanosis or edema  Wound Exam: clean, dry, intact   Drainage:  None: wound tissue dry  Motor Exam: Quadriceps and Hamstrings Intact  Sensory Exam: Superficial Peroneal, Deep Peroneal and Tibial normal   Assessment:    3 Days Post-Op  Procedure(s) (LRB): CANNULATED HIP PINNING (Left)  ADDITIONAL DIAGNOSIS:  Principal Problem:   Hip fracture (HCC) Active Problems:   Paroxysmal SVT (supraventricular tachycardia) (HCC)   History of CVA (cerebrovascular accident)   CKD (chronic kidney disease), stage III (HCC)   Hypertension   Prolonged QT interval   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 Minimal left hip pain and surgical site looks normal. Stable from an ortho standpoint, will continue to follow    Guy Sandifer 12/01/2020, 10:09 AM

## 2020-12-01 NOTE — Plan of Care (Signed)
  Problem: Education: Goal: Knowledge of General Education information will improve Description: Including pain rating scale, medication(s)/side effects and non-pharmacologic comfort measures Outcome: Progressing   Problem: Health Behavior/Discharge Planning: Goal: Ability to manage health-related needs will improve Outcome: Progressing   Problem: Clinical Measurements: Goal: Ability to maintain clinical measurements within normal limits will improve Outcome: Progressing Goal: Will remain free from infection Outcome: Progressing Goal: Diagnostic test results will improve Outcome: Progressing Goal: Respiratory complications will improve Outcome: Progressing Goal: Cardiovascular complication will be avoided Outcome: Progressing   Problem: Activity: Goal: Risk for activity intolerance will decrease Outcome: Progressing   Problem: Nutrition: Goal: Adequate nutrition will be maintained Outcome: Progressing   Problem: Coping: Goal: Level of anxiety will decrease Outcome: Progressing   Problem: Elimination: Goal: Will not experience complications related to bowel motility Outcome: Progressing Goal: Will not experience complications related to urinary retention Outcome: Progressing   Problem: Pain Managment: Goal: General experience of comfort will improve Outcome: Progressing   Problem: Safety: Goal: Ability to remain free from injury will improve Outcome: Progressing   Problem: Skin Integrity: Goal: Risk for impaired skin integrity will decrease Outcome: Progressing   Problem: Education: Goal: Knowledge of disease or condition will improve Outcome: Progressing Goal: Knowledge of secondary prevention will improve Outcome: Progressing Goal: Knowledge of patient specific risk factors addressed and post discharge goals established will improve Outcome: Progressing Goal: Individualized Educational Video(s) Outcome: Progressing   Problem: Coping: Goal: Will verbalize  positive feelings about self Outcome: Progressing   Problem: Self-Care: Goal: Ability to participate in self-care as condition permits will improve Outcome: Progressing Goal: Verbalization of feelings and concerns over difficulty with self-care will improve Outcome: Progressing Goal: Ability to communicate needs accurately will improve Outcome: Progressing   Problem: Nutrition: Goal: Risk of aspiration will decrease Outcome: Progressing Goal: Dietary intake will improve Outcome: Progressing   Problem: Ischemic Stroke/TIA Tissue Perfusion: Goal: Complications of ischemic stroke/TIA will be minimized Outcome: Progressing   

## 2020-12-01 NOTE — Progress Notes (Addendum)
   Palliative Medicine Inpatient Follow Up Note  Reason for consult:  "Goals of Care "goals of care, stroke, failed SLP eval, NPO, aspiration pneumonia"  HPI:  Per intake H&P --> 80 year old female with past medical history of dementia, anxiety, depression,s/p R MCA stent in 3570 complicated by CVA, PVD s/p embolectomy for popliteal occlusions,CKD 3, cerebral aneurysm, COPD, GERD, paroxysmal SVT, orthostatic hypotension, mitral valve prolapse, recent hospitalization from 3/28-4/1 for CAP who presented to [facility] for evaluation of knee pain following a fall on April 2.She was found to have a left hip fracture and is now status post surgery with ortho on 4/7. Her hospitalization was complicated by delirium with left upper extremity weakness on 4/8 and code stroke was called. [facility] to assess for possible placement of stent in severely narrowed right MCA branchwith Dr. Estanislado Pandy on Monday 4/11.SLP evaluation recommending dysphagia 1 diet.  Palliative care has been asked to get involved to aid in additional goals of care conversations.   Today's Discussion (12/01/2020):  *Please note that this is a verbal dictation therefore any spelling or grammatical errors are due to the "[employer]" system interpretation.  Chart reviewed. Patients PO's remain quite poor at about 25%. PT/OT had seen [facility] yesterday and recommended SNF placement. Plan for DSA tomorrow.   I met with Orabelle at bedside this morning.  She was awake and alert.  Was not noted to be in any distress.  Ongoing support _______________________________________________ Addendum:  I met with patients granddaughter at bedside and completed a brief update. Of not patients PO intake has been incredibly poor per nursing.  Objective Assessment: Vital Signs Vitals:   12/01/20 0757 12/01/20 0845  BP: (!) 176/67   Pulse: 67   Resp: 18   Temp: 97.9 F (36.6 C)    SpO2: 93% 93%    Intake/Output Summary (Last 24 hours) at 12/01/2020 1779 Last data filed at 11/30/2020 2255 Gross per 24 hour  Intake --  Output 450 ml  Net -450 ml   Last Weight  Most recent update: 11/27/2020  2:19 PM   Weight  42.3 kg (93 lb 4.8 oz)           Gen:  Frail elderly F in NAD HEENT: moist mucous membranes CV: Regular rate and rhythm  PULM: On RA ABD: soft/nontender  EXT: No edema  Neuro: Alert - disoriented  SUMMARY OF RECOMMENDATIONS DNAR/DNI  MOST Completed, paper copy placed onto the chart electric copy can be found in [place]  DNR Form Completed, paper copy placed onto the chart electric copy can be found in [place]  Time trial of therapies - 72 hours (Started Saturday)  Ongoing GOC in the oncoming days - if patient declines her daughter, [name] would be interested in transitioning [place] [facility] home with [facility] (has rapport with this hospice and RN live 5 minutes "down the road")  Time Spent: 25 Greater than 50% of the time was spent in counseling and coordination of care ______________________________________________________________________________________ [place] Team Team Cell Phone: [phone] Please utilize secure chat with additional questions, if there is no response within 30 minutes please call the above phone number  Palliative Medicine Team providers are available by phone from 7am to 7pm daily and can be reached through the team cell phone.  Should this patient require assistance outside of these hours, please call the patient's attending physician.

## 2020-12-01 NOTE — Progress Notes (Addendum)
STROKE TEAM PROGRESS NOTE    Interval History   No acute events overnight.  She has prior history of right MCA pipeline stent with known restenosis and presents with recurrent right MCA branch infarct with underlying significant restenosis in the stent. Still waiting for diagnostic catheter angiogram scheduled tomorrow with Dr. Estanislado Pandy.  Family has met with palliative care and has agreed to DNR and would even consider hospice if the patient's condition declines further. Pertinent Lab Work and Imaging    11/29/20 CT Head WO IV Contrast 1. No acute abnormality and no change from 2 days prior 2. ASPECTS is 10 3. Atrophy and chronic ischemic changes, stable from the prior Study.  11/29/20 CT Angio Head and Neck W WO IV Contrast 1. 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 2. Approximately 15 mm area penumbra/mismatch in the posterior right MCA territory, which likely is in the territory supplied by the M2 MCA stenosis described above. No core infarct identified. 3. Streak artifact limits evaluation for right MCA in-stent stenosis; however, there is opacification proximal and distal to the stent, similar to prior and consistent with stent patency. 4. Redemonstrated flow diverter device within the M1 right MCA. 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 5. Similar 1-2 mm anteriorly projecting outpouching arising from the anterior communicating artery, which may represent a small aneurysm versus infundibulum with small associated vessel not seen.  11/29/20 MRI Brain WO IV Contrast 1. Area of restricted diffusion involving the posterior aspect of the right insula and right frontoparietal region, corresponding to area of increased T-max on prior CT perfusion, consistent with acute/subacute infarct. 2. Remote infarct in the right basal ganglia region. 3. Moderate chronic small vessel  ischemia. 4. Left maxillary sinus disease with small fluid level. Correlate for acute sinusitis. 5. Bilateral mastoid effusion.  11/19/20 Echocardiogram Complete  1. Left ventricular ejection fraction, by estimation, is 65 to 70%. The  left ventricle has normal function. The left ventricle has no regional  wall motion abnormalities. Left ventricular diastolic parameters are  indeterminate.  2. Right ventricular systolic function is normal. The right ventricular  size is normal. There is moderately elevated pulmonary artery systolic  pressure.  3. A small pericardial effusion is present. The pericardial effusion is  circumferential. There is no evidence of cardiac tamponade.  4. The mitral valve is normal in structure. Trivial mitral valve  regurgitation. No evidence of mitral stenosis.  5. The aortic valve is normal in structure. Aortic valve regurgitation is  trivial. No aortic stenosis is present.  6. The inferior vena cava is dilated in size with >50% respiratory  variability, suggesting right atrial pressure of 8 mmHg.   Physical Examination   Constitutional: Frail elderly Caucasian lady not in distress Cardiovascular: Normal RR Respiratory: No increased WOB   Mental status: Confused, oriented to self only. Follows commands ( make fist/open, close eyes/open show two fingers.  Exam limited due to confusion today. Speech: Fluent w/repetition and naming intact  Cranial nerves: EOMI, VFF, Face appears symmetric, Tongue midline Motor: Normal bulk and tone. RUE 5/5 RLE 2/5 left hemiparesis with LUE 3/5 LLE 2/5 ( exam to LLE limited by pain in the setting of recent hip surgery) Drift noted in LUE.  Decreased effort on exam today especially with BLE.  Sensory: Difficult to assess, sensation seems to be intact to light touch bilaterally  Coordination: FNF intact  Gait: Deferred  NIHSS:  1a Level of Conscious: 0  1b LOC Questions: 0 1c LOC Commands: 0 2 Best Gaze: 0 3  Visual: 0 4 Facial Palsy: 0 5a Motor Arm - left: 1 5b Motor Arm - Right: 0 6a Motor Leg - Left: 2 6b Motor Leg - Right: 2 7 Limb Ataxia: 0 8 Sensory: 0 9 Best Language: 0 10 Dysarthria: 1 11 Extinct. and Inatten: 1  TOTAL: 7  Assessment and Plan   Barbara Hill is a 80 y.o. female w/pmh of dementia, anxiety, depression s/p R MCA stent in 9242 complicated by CVA, PVD s/p embolectomy for popliteal occlusions,CKD 3, cerebral aneurysm, COPD, GERD, paroxysmal SVT, orthostatic hypotension, mitral valve prolapse, recent hospitalization from 3/28-4/1 for CAP, fall on 4/2 with resulting left femoral neck fracture for which she underwent left cannulated hip pinning on 4/7 who presents with a R MCA stroke.   #R MCA Stroke  Patient was initially admitted on 4/7, presenting with knee pain following a fall on April 2nd, for which she underwent surgery w/left cannulated hip pinning that was done on day of admission. On 4/8 the day following surgery she developed left sided weakness w/LKW 1050, sx onset 1102. A code stroke was called. CTH w/no acute abnormality. CTA Head and Neck pertinent for focal high grade stenosis of a right proximal M2 MCA branch with distal opacification, new from prior. Streak artifact limits evaluation for right MCA in-stent stenosis; however, there is opacification proximal and distal to the stent, similar to prior and consistent with stent patency. Echo done 11/19/20 w/EF 65 to 70 %, LA normal in size, no PFO.  Stroke labs are currently pending. In regards to her stroke etiology, hypoperfusion in the setting of known RMCA stenosis was considered however per review of blood pressure trends she did not become hypotensive at the time of surgery. More than likely her R MCA stroke is in the setting of new right M2 MCA high grade stenosis.  - Continue DAPT for secondary stroke prevention purposes  - Will initiate statin pending lipid panel  - DSA Monday w/Dr. Estanislado Pandy - Follow up  stroke labs   #Hypertension She has a history of HTN and takes Amlodipine 5 mg QD + Losartan 25 mg QD at home. Given stenosis are recommending permissive HTN up to 180. SBP trending 130-180 with one isolated SBP at 190  - Do not resume home anti hypertensives to allow for permissive HTN   #Hyperlipidemia From a stroke prevention stand point, the LDL goal is < 70. Lipid panel pending.   Hospital day # 4  Ruta Hinds, NP  [facility] Nurse Practitioner Patient seen and discussed with attending physician Dr. Leonie Man    I have personally obtained history,examined this patient, reviewed notes, independently viewed imaging studies, participated in medical decision making and plan of care.ROS completed by me personally and pertinent positives fully documented  I have made any additions or clarifications directly to the above note. Agree with note above.  Continue dual antiplatelet therapy.  Dr. Estanislado Pandy to do diagnostic angiogram tomorrow.  Family has agreed to DNR after discussion with palliative care team would want to continue therapy and support for the next few days.  If condition declines further they may be willing for comfort care.  Greater than 50% time during this 25-minute visit was spent on counseling and coordination of care and discussion with patient and care team and answering questions  Antony Contras, MD Medical Director [facility] Pager:  [phone] 12/01/2020 4:57 PM  dual antiplatelet therapy and aggressive risk factor modification.  Diagnostic catheter angiogram next week with Dr. Estanislado Pandy decide if patient is a candidate for repeat MCA angioplasty.  Long discussion with patient and daughter at the bedside and answered questions.  Antony Contras, MD Medical Director [place] Pager: [phone] 12/01/2020 9:20 AM   To contact Stroke Continuity provider, please refer to [email or web address] After hours, contact General Neurology

## 2020-12-01 NOTE — Evaluation (Signed)
Speech Language Pathology Evaluation Patient Details Name: Barbara Hill MRN: [id number] DOB: 08-01-1941 Today's Date: 12/01/2020 Time: 1000-1020 SLP Time Calculation (min) (ACUTE ONLY): 20 min  Problem List:  Patient Active Problem List   Diagnosis Date Noted  . 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 Prolonged QT interval 11/28/2020  . Hip fracture (HCC) 11/27/2020  . Hypertension   . Sepsis due to pneumonia (HCC) 11/19/2020  . CAP (community acquired pneumonia) 11/18/2020  . TIA (transient ischemic attack) 09/17/2020  . Protein-calorie malnutrition, severe 02/05/2020  . Ischemic leg 01/25/2020  . COPD (chronic obstructive pulmonary disease) (HCC) 01/25/2020  . Normocytic anemia 01/25/2020  . History of CVA (cerebrovascular accident) 01/25/2020  . Anxiety 01/25/2020  . CKD (chronic kidney disease), stage III (HCC) 01/25/2020  . 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 Brain aneurysm 06/29/2016  . Lumbar contusion   . Orthostatic hypotension   . First degree AV block   . Hypokalemia   . Syncope 04/10/2016  . Paroxysmal SVT (supraventricular tachycardia) (HCC) 04/21/2011  . 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 Tobacco use disorder 04/21/2011   Past Medical History:  Past Medical History:  Diagnosis Date  . Anxiety   . Brain aneurysm    pipeline stent 2017   . COPD (chronic obstructive pulmonary disease) (HCC)   . Fatigue   . H/O: hysterectomy   . Headache(784.0)   . History of cardiac catheterization    LHC 10/02: Normal coronary arteries  . History of nuclear stress test    Myoview 10/12: Normal stress nuclear study.  . Hypertension   . Mitral valve prolapse    Echo 8/17:Mild LVH, EF 60-65, normal wall motion, grade 2 diastolic dysfunction, mild AI, mild MR, mild LAE // echo 11/01: EF 60%, mild anterior MVP with trivial MR, PASP 35-40 mmHg, LAE  . Orthostasis   . PSVT (paroxysmal supraventricular tachycardia)  (HCC)    palps  . Radiculopathy    Past Surgical History:  Past Surgical History:  Procedure Laterality Date  . ABDOMINAL HYSTERECTOMY    . APPENDECTOMY    . BUBBLE STUDY  01/29/2020   Procedure: BUBBLE STUDY;  Surgeon: Parke Poisson, MD;  Location: [facility] ENDOSCOPY;  Service: Cardiology;;  . CARDIAC CATHETERIZATION  12/95  . CATARACT EXTRACTION    . CHOLECYSTECTOMY    . EMBOLECTOMY Bilateral 01/25/2020   Procedure: Bilateral popliteal EMBOLECTOMY.;  Surgeon: [name], MD;  Location: [facility] OR;  Service: Vascular;  Laterality: Bilateral;  . FASCIOTOMY Bilateral 01/26/2020   Procedure: FASCIOTOMY;  Surgeon: [name], MD;  Location: [facility] OR;  Service: Vascular;  Laterality: Bilateral;  . HIP PINNING,CANNULATED Left 11/28/2020   Procedure: CANNULATED HIP PINNING;  Surgeon: Teryl Lucy, MD;  Location: [facility] ORS;  Service: Orthopedics;  Laterality: Left;  . IR GENERIC HISTORICAL  06/02/2016   IR RADIOLOGIST EVAL & MGMT 06/02/2016 [facility]  . IR GENERIC HISTORICAL  06/29/2016   IR ANGIO INTRA EXTRACRAN SEL INTERNAL CAROTID UNI R MOD SED 06/29/2016 [name], MD [facility]  . IR GENERIC HISTORICAL  06/29/2016   IR ANGIOGRAM FOLLOW UP STUDY 06/29/2016 [name], MD [facility]  . IR GENERIC HISTORICAL  06/29/2016   IR NEURO EACH ADD'L AFTER BASIC UNI RIGHT (MS) 06/29/2016 [name], MD [facility]  . IR GENERIC HISTORICAL  06/29/2016   IR 3D INDEPENDENT WKST 06/29/2016 [name], MD [facility]  . IR GENERIC HISTORICAL  06/29/2016  IR TRANSCATH/EMBOLIZ 06/29/2016 [name], MD [facility]  . IR GENERIC HISTORICAL  06/29/2016   IR ANGIO VERTEBRAL SEL SUBCLAVIAN INNOMINATE UNI R MOD SED 06/29/2016 [name], MD [facility]  . IR GENERIC HISTORICAL  07/28/2016   IR RADIOLOGIST EVAL & MGMT 07/28/2016 [facility]  . IR GENERIC HISTORICAL  11/12/2016   IR ANGIO INTRA EXTRACRAN SEL COM CAROTID INNOMINATE BILAT MOD SED 11/12/2016 [name], MD [facility]  . IR GENERIC HISTORICAL  11/12/2016   IR ANGIO VERTEBRAL SEL VERTEBRAL UNI L MOD SED 11/12/2016 [name], MD [facility]  . IR GENERIC HISTORICAL  11/12/2016   IR ANGIO VERTEBRAL SEL SUBCLAVIAN INNOMINATE UNI R MOD SED 11/12/2016 [name], MD [facility]  . NASAL SEPTUM SURGERY    . RADIOLOGY WITH ANESTHESIA N/A 06/29/2016   Procedure: EMBOLIZATION;  Surgeon: [name], MD;  Location: [facility] OR;  Service: Radiology;  Laterality: N/A;  . ROTATOR CUFF REPAIR    . TEE WITHOUT CARDIOVERSION N/A 01/29/2020   Procedure: TRANSESOPHAGEAL ECHOCARDIOGRAM (TEE);  Surgeon: Parke Poisson, MD;  Location: [facility] ENDOSCOPY;  Service: Cardiology;  Laterality: N/A;   HPI:  80 year old female with past medical history of dementia, anxiety, depression, s/p R MCA stent in 2017 complicated by CVA, PVD s/p embolectomy for popliteal occlusions, CKD 3, cerebral aneurysm, COPD, GERD, paroxysmal SVT, orthostatic hypotension, mitral valve prolapse, recent hospitalization from 3/28-4/1 for CAP who presented to [facility] for evaluation of knee pain following a fall on April 2.  Per chart, she was found to have a left hip fracture and is now status post surgery with ortho on 4/7.  Developed delirium with left upper extremity weakness on 4/8 and code stroke was called.  MRI revealed Area of restricted diffusion involving the posterior aspect of the right insula and right frontoparietal region, corresponding to area of increased T-max on prior CT perfusion, consistent with acute/subacute infarct, remote infarct in the right basal ganglia region, moderate chronic small vessel ischemia.   Assessment / Plan / Recommendation Clinical Impression  Patient presents with a mod-severe cognitive-linguistic impairment and is likely either at or near her cognitive baseline (h/o dementia). She was alert and communicative but kept eyes closed and head turned to right side. She was able to follow simple  one step instructions to "open eyes", "stick out tongue", etc. with moderate verbal and tactile cues. She was able to state her name, birthday and correct age but was not able to demonstrate orientation to time, "its June, July, August". She did state place as "[employer]" and then said "its a hospital place". When SLP told her she was at [facility] she replied, "thats in [place] isnt it?" When asked how she was feeling she stated "not too good" an d"I feel like my head's been filled, emptied and filled" and that she has had a headache "all weekend". SLP not recommending acute level ST intervention as patient is likely at or near baseline and will have 24 hour supervision and assist from family upon discharge.    SLP Assessment  SLP Recommendation/Assessment: Patient does not need any further Speech Lanaguage Pathology Services (No ST services recommended for speech-language cognitive but continue with dysphagia tx) SLP Visit Diagnosis: Cognitive communication deficit (R41.841)    Follow Up Recommendations  Skilled Nursing facility    Frequency and Duration   N/A        SLP Evaluation Cognition  Overall Cognitive Status: Difficult to assess Orientation Level: Oriented to person;Disoriented to  situation;Disoriented to time;Disoriented to place;Other (comment) (stated place as "[facility], a hospital place") Attention: Focused Focused Attention: Impaired Focused Attention Impairment: Verbal basic;Functional basic Memory: Impaired Memory Impairment: Retrieval deficit;Storage deficit Awareness: Impaired Awareness Impairment: Intellectual impairment Problem Solving: Impaired Problem Solving Impairment: Functional basic Safety/Judgment: Impaired       Comprehension  Auditory Comprehension Overall Auditory Comprehension: Impaired Yes/No Questions: Impaired Basic Biographical Questions: 26-50% accurate Commands: Impaired One Step Basic Commands: 0-24% accurate Conversation:  Simple Interfering Components: Attention;Processing speed EffectiveTechniques: Extra processing time;Repetition;Visual/Gestural cues Visual Recognition/Discrimination Discrimination: Not tested Reading Comprehension Reading Status: Not tested    Expression Expression Primary Mode of Expression: Verbal Verbal Expression Overall Verbal Expression: Impaired Level of Generative/Spontaneous Verbalization: Phrase;Conversation;Sentence Pragmatics: Impairment Impairments: Eye contact;Interpretation of nonverbal communication Interfering Components: Attention;Premorbid deficit Effective Techniques: Open ended questions Non-Verbal Means of Communication: Not applicable   Oral / Motor  Oral Motor/Sensory Function Overall Oral Motor/Sensory Function: Within functional limits   GO                   Angela Nevin, MA, CCC-SLP Speech Therapy [facility]

## 2020-12-01 NOTE — Progress Notes (Signed)
PROGRESS NOTE    Barbara Hill  EQA:[id number] DOB: 03-04-1941 DOA: 11/27/2020 PCP: Richmond Campbell., PA-C    Chief Complaint  Patient presents with  . Fall    Brief Narrative:  80 year old female with past medical history of dementia, anxiety, depression, s/p R MCA stent in 2017 complicated by CVA, PVD s/p embolectomy for popliteal occlusions, CKD 3, cerebral aneurysm, COPD, GERD, paroxysmal SVT, orthostatic hypotension, mitral valve prolapse, recent hospitalization from 3/28-4/1 for CAP who presented to [facility] for evaluation of knee pain following a fall on April 2. She was found to have a left hip fracture and is now status post surgery with ortho on 4/7.  Her hospitalization was complicated by delirium with left upper extremity weakness on 4/8 and code stroke was called.  She was transferred to [facility] for diagnostic cerebral angiogram on Monday to assess for possible placement of stent in severely narrowed right MCA branch with Dr. [name] on Monday 4/11.  SLP evaluation recommending dysphagia 1 diet with nectar thick liquids. .  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  Acute metabolic encephalopathy probably secondary to stroke and recent surgery. As per the family patient at baseline is independent with ADLs. Minimize sedating medications and pain  medications.  Code stroke called on 11/29/2020 for altered mental status and left upper extremity weakness CT of the head is negative without acute abnormality CT angiogram of the head and neck showed focal high-grade stenosis of the right proximal M2 MCA branch with distal opacification new from prior approximately 15 mm area of penumbra/mismatch in the posterior right MCA territory, likely in the territory supplied by the M2 MCA stenosis.  Neurology consulted recommended aspirin and Plavix and transferred to the [facility] for neuro interventional radiology evaluation. Neuro IR on board recommended catheter angiogram, plan for ? MCA ANGIOPLASTY.  Hemoglobin A1c is 5.7% LDL is 69. Last echocardiogram in March 2022 showed left ventricular ejection fraction of 65 to 70%. Therapy evaluations done, recommending SNF versus home health PT. She was started on aspirin 81 mg and Plavix 75 mg daily. Modified permissive hypertensive protocol given advanced age.    Dysphagia in the setting of altered mental status and stroke NG tube was attempted 2-3 times without success.  SLP evaluation today recommended dysphagia 1 diet with nectar thick liquids..   History of stage IIIa CKD Creatinine appears to be better than baseline now.   Hypokalemia and hypomagnesemia:  Replaced.  Repeat in am.     Anemia of chronic disease Patient's baseline hemoglobin around 10. Recheck hemoglobin in the morning.   Essential hypertension Modified permissive hypertension protocol given advanced age As needed hydralazine to keep systolic blood pressure less than 180 mmHg   Bibasilar pneumonia, possibly aspiration pneumonia.  Started the patient on Unasyn.  Recheck CXR in am.  She remains afebrile,and leukocytosis is improving.  DVT prophylaxis: Lovenox code Status: Full code Family Communication: None at bedside Disposition:   Status is: Inpatient  Remains inpatient appropriate because:Ongoing  diagnostic testing needed not appropriate for outpatient work up, Unsafe d/c plan and IV treatments appropriate due to intensity of illness or inability to take PO   Dispo: The patient is from: Home              Anticipated d/c is to: Pending              Patient currently is not medically stable to d/c.   Difficult to place patient No       Consultants:   Neurology  Orthopedics  Procedures: None Antimicrobials: None  Subjective: Patient alert not in any kind of distress oriented to person only. Objective: Vitals:   11/30/20 2342 12/01/20 0400 12/01/20 0757 12/01/20 0845  BP: (!) 179/80 (!) 178/78 (!) 176/67   Pulse: 83 97 67   Resp: 18 17 18    Temp: 98.2 F (36.8 C) 98.3 F (36.8 C) 97.9 F (36.6 C)   TempSrc: Oral     SpO2: 93% 92% 93% 93%  Weight:      Height:        Intake/Output Summary (Last 24 hours) at 12/01/2020 1412 Last data filed at 11/30/2020 2255 Gross per 24 hour  Intake --  Output 450 ml  Net -450 ml   Filed Weights   11/27/20 1419  Weight: 42.3 kg    Examination:  General exam: alert and comfortable.  Respiratory system: diminished air entry fair, no wheezing heard Cardiovascular system:s1s2, RRR no JVD.  Gastrointestinal system: Abdomen is soft, NT BS+ Central nervous system: pt is alert , but confused, ale to follow simple commands.  Extremities: no pedal edema.  Skin: No rashes seen.   psychiatry: Mood is appropriate.     Data Reviewed: I have personally reviewed following labs and imaging studies  CBC: Recent Labs  Lab 11/27/20 1313 11/28/20 0826 11/29/20 0409 11/30/20 1239  WBC 17.0* 18.6* 18.0* 13.4*  NEUTROABS  --   --   --  10.4*  HGB 12.4 11.6* 10.2* 9.7*  HCT 37.3 35.0* 32.1* 28.8*  MCV 89.7 90.7 94.4 91.1  PLT 617* 528* 459* 377    Basic Metabolic Panel: Recent Labs  Lab 11/27/20 1313 11/28/20 0826 11/29/20 0409 11/30/20 1239 12/01/20 0237  NA 135 138 134* 134* 135  K 3.5 3.5 4.2 2.7* 2.6*  CL 98 101  99 101 99  CO2 23 25 24 27 28   GLUCOSE 132* 107* 98 138* 98  BUN 18 20 20 9 8   CREATININE 0.98 0.87 0.85 0.65 0.64  CALCIUM 9.7 9.6 9.3 8.8* 8.7*  MG  --  1.5*  --  1.2*  --     GFR: Estimated Creatinine Clearance: 38.7 mL/min (by C-G formula based on SCr of 0.64 mg/dL).  Liver Function Tests: No results for input(s): AST, ALT, ALKPHOS, BILITOT, PROT, ALBUMIN in the last 168 hours.  CBG: Recent Labs  Lab 11/29/20 1133 11/30/20 0609 11/30/20 2133  GLUCAP 100* 118* 120*     Recent Results (from the past 240 hour(s))  Resp Panel by RT-PCR (Flu A&B, Covid) Nasopharyngeal Swab     Status: None   Collection Time: 11/27/20  3:39 PM   Specimen: Nasopharyngeal Swab; Nasopharyngeal(NP) swabs in vial transport medium  Result Value Ref Range Status   SARS Coronavirus 2 by RT PCR NEGATIVE NEGATIVE Final    Comment: (NOTE) SARS-CoV-2 target nucleic  acids are NOT DETECTED.  The SARS-CoV-2 RNA is generally detectable in upper respiratory specimens during the acute phase of infection. The lowest concentration of SARS-CoV-2 viral copies this assay can detect is 138 copies/mL. A negative result does not preclude SARS-Cov-2 infection and should not be used as the sole basis for treatment or other patient management decisions. A negative result may occur with  improper specimen collection/handling, submission of specimen other than nasopharyngeal swab, presence of viral mutation(s) within the areas targeted by this assay, and inadequate number of viral copies(<138 copies/mL). A negative result must be combined with clinical observations, patient history, and epidemiological information. The expected result is Negative.  Fact Sheet for Patients:  BloggerCourse.com  Fact Sheet for Healthcare Providers:  SeriousBroker.it  This test is no t yet approved or cleared by the [place] FDA and  has been authorized for detection and/or  diagnosis of SARS-CoV-2 by FDA under an Emergency Use Authorization (EUA). This EUA will remain  in effect (meaning this test can be used) for the duration of the COVID-19 declaration under Section 564(b)(1) of the Act, 21 U.S.C.section 360bbb-3(b)(1), unless the authorization is terminated  or revoked sooner.       Influenza A by PCR NEGATIVE NEGATIVE Final   Influenza B by PCR NEGATIVE NEGATIVE Final    Comment: (NOTE) The Xpert Xpress SARS-CoV-2/FLU/RSV plus assay is intended as an aid in the diagnosis of influenza from Nasopharyngeal swab specimens and should not be used as a sole basis for treatment. Nasal washings and aspirates are unacceptable for Xpert Xpress SARS-CoV-2/FLU/RSV testing.  Fact Sheet for Patients: BloggerCourse.com  Fact Sheet for Healthcare Providers: SeriousBroker.it  This test is not yet approved or cleared by the [place] FDA and has been authorized for detection and/or diagnosis of SARS-CoV-2 by FDA under an Emergency Use Authorization (EUA). This EUA will remain in effect (meaning this test can be used) for the duration of the COVID-19 declaration under Section 564(b)(1) of the Act, 21 U.S.C. section 360bbb-3(b)(1), unless the authorization is terminated or revoked.  Performed at [facility], [address], [place], [place] [address]   Surgical PCR screen     Status: None   Collection Time: 11/28/20 11:58 AM   Specimen: Nasal Mucosa; Nasal Swab  Result Value Ref Range Status   MRSA, PCR NEGATIVE NEGATIVE Final   Staphylococcus aureus NEGATIVE NEGATIVE Final    Comment: (NOTE) The Xpert SA Assay (FDA approved for NASAL specimens in patients 37 years of age and older), is one component of a comprehensive surveillance program. It is not intended to diagnose infection nor to guide or monitor treatment. Performed at [facility], [address], [place], [place] [address]          Radiology Studies: MR BRAIN WO CONTRAST  Result Date: 11/29/2020 CLINICAL DATA:  Neuro deficit, acute, stroke suspected. Altered mental status. EXAM: MRI HEAD WITHOUT CONTRAST TECHNIQUE: Multiplanar, multiecho pulse sequences of the brain and surrounding structures were obtained without intravenous contrast. COMPARISON:  Head CT November 29, 2020 FINDINGS: Brain: Area of restricted diffusion involving the posterior aspect of the right insula and right frontoparietal region, consistent with acute/subacute infarct, corresponding to area of increased T-max on prior CT perfusion. No hemorrhage, hydrocephalus, extra-axial collection or mass lesion. Remote infarct in the right basal ganglia region. Scattered and confluent foci of T2 hyperintensity are seen within the white matter of cerebral hemispheres, nonspecific, most likely related to chronic small vessel ischemia. Susceptibility  artifact in the anterior right sylvian fissure related to right M1/MCA stent. Vascular: Normal flow voids. Skull and upper cervical spine: Normal marrow signal. Sinuses/Orbits: Mild mucosal thickening of the left maxillary sinus with small fluid level. Right lens surgery. Other: Bilateral mastoid effusion. IMPRESSION: 1. Area of restricted diffusion involving the posterior aspect of the right insula and right frontoparietal region, corresponding to area of increased T-max on prior CT perfusion, consistent with acute/subacute infarct. 2. Remote infarct in the right basal ganglia region. 3. Moderate chronic small vessel ischemia. 4. Left maxillary sinus disease with small fluid level. Correlate for acute sinusitis. 5. Bilateral mastoid effusion. Electronically Signed   By: Baldemar LenisKatyucia  De [place] Rodrigues M.D.   On: 11/29/2020 16:20   DG CHEST PORT 1 VIEW  Result Date: 11/29/2020 CLINICAL DATA:  Postoperative hip fracture repair.  Cough. EXAM: PORTABLE CHEST 1 VIEW COMPARISON:  November 27, 2020 FINDINGS:  There is consolidation in the left lower lobe. There is ill-defined patchy airspace opacity in the right base. The heart size and pulmonary vascularity are normal. No adenopathy. No bone lesions. Contrast noted in each renal collecting system. IMPRESSION: Airspace consolidation with volume loss left lower lung region. Patchy infiltrate right base. Suspect bibasilar pneumonia. Underlying aspiration is possible. Heart size normal. Electronically Signed   By: Bretta BangWilliam  Woodruff III M.D.   On: 11/29/2020 16:46        Scheduled Meds: .  stroke: mapping our early stages of recovery book   Does not apply Once  . aspirin  81 mg Oral Daily   Or  . aspirin  300 mg Rectal Daily  . Chlorhexidine Gluconate Cloth  6 each Topical Daily  . clopidogrel  75 mg Oral Daily  . docusate sodium  100 mg Oral BID  . enoxaparin (LOVENOX) injection  40 mg Subcutaneous Q24H  . famotidine  20 mg Oral Daily  . ferrous sulfate  325 mg Oral TID PC  . fluticasone furoate-vilanterol  1 puff Inhalation Daily  . mupirocin ointment  1 application Nasal BID  . pantoprazole  40 mg Oral Daily  . potassium chloride  40 mEq Oral BID  . senna  1 tablet Oral BID   Continuous Infusions: . sodium chloride 10 mL/hr at 11/30/20 1640  . ampicillin-sulbactam (UNASYN) IV 1.5 g (12/01/20 1216)  . magnesium sulfate bolus IVPB    . potassium chloride       LOS: 4 days      Kathlen ModyVijaya Prajna Vanderpool, MD [facility]   To contact the attending provider between 7A-7P or the covering provider during after hours 7P-7A, please log into the web site [email or web address] and access using universal [place] password for that web site. If you do not have the password, please call the hospital operator.  12/01/2020, 2:12 PM

## 2020-12-02 DIAGNOSIS — S72002D Fracture of unspecified part of neck of left femur, subsequent encounter for closed fracture with routine healing: Secondary | ICD-10-CM | POA: Diagnosis not present

## 2020-12-02 DIAGNOSIS — I63311 Cerebral infarction due to thrombosis of right middle cerebral artery: Secondary | ICD-10-CM | POA: Diagnosis not present

## 2020-12-02 DIAGNOSIS — I10 Essential (primary) hypertension: Secondary | ICD-10-CM | POA: Diagnosis not present

## 2020-12-02 DIAGNOSIS — Z8673 Personal history of transient ischemic attack (TIA), and cerebral infarction without residual deficits: Secondary | ICD-10-CM | POA: Diagnosis not present

## 2020-12-02 DIAGNOSIS — N1831 Chronic kidney disease, stage 3a: Secondary | ICD-10-CM | POA: Diagnosis not present

## 2020-12-02 LAB — BASIC METABOLIC PANEL
Anion gap: 7 (ref 5–15)
Anion gap: 7 (ref 5–15)
BUN: 10 mg/dL (ref 8–23)
BUN: 14 mg/dL (ref 8–23)
CO2: 25 mmol/L (ref 22–32)
CO2: 28 mmol/L (ref 22–32)
Calcium: 9 mg/dL (ref 8.9–10.3)
Calcium: 9.3 mg/dL (ref 8.9–10.3)
Chloride: 100 mmol/L (ref 98–111)
Chloride: 99 mmol/L (ref 98–111)
Creatinine, Ser: 0.76 mg/dL (ref 0.44–1.00)
Creatinine, Ser: 0.8 mg/dL (ref 0.44–1.00)
GFR, Estimated: >60 mL/min (ref >60)
GFR, Estimated: >60 mL/min (ref >60)
Glucose, Bld: 95 mg/dL (ref 70–99)
Glucose, Bld: 101 mg/dL — ABNORMAL HIGH (ref 70–99)
Potassium: 4 mmol/L (ref 3.5–5.1)
Potassium: 3.3 mmol/L — ABNORMAL LOW (ref 3.5–5.1)
Sodium: 132 mmol/L — ABNORMAL LOW (ref 135–145)
Sodium: 134 mmol/L — ABNORMAL LOW (ref 135–145)

## 2020-12-02 LAB — PROTIME-INR
INR: 1.6 — ABNORMAL HIGH (ref 0.8–1.2)
Prothrombin Time: 18.7 seconds — ABNORMAL HIGH (ref 11.4–15.2)

## 2020-12-02 LAB — MAGNESIUM: Magnesium: 2.1 mg/dL (ref 1.7–2.4)

## 2020-12-02 MED ORDER — ENSURE ENLIVE PO LIQD
237.0000 mL | Freq: Three times a day (TID) | ORAL | Status: DC
  Administered 2020-12-02 – 2020-12-04 (×2): 237 mL via ORAL

## 2020-12-02 MED ORDER — ADULT MULTIVITAMIN W/MINERALS CH
1.0000 | ORAL_TABLET | Freq: Every day | ORAL | Status: DC
  Administered 2020-12-02 – 2020-12-04 (×3): 1 via ORAL
  Filled 2020-12-02 (×3): qty 1

## 2020-12-02 MED ORDER — DOCUSATE SODIUM 50 MG/5ML PO LIQD
100.0000 mg | Freq: Two times a day (BID) | ORAL | Status: DC
  Administered 2020-12-02 – 2020-12-04 (×5): 100 mg via ORAL
  Filled 2020-12-02 (×5): qty 10

## 2020-12-02 NOTE — Progress Notes (Signed)
Interventional Radiology Brief Note  Patient brought to [facility] today for possible angiogram.  Patient recently admitted with hip fracture s/p OR fixation.  Since surgery, patient has experienced ongoing debility with AMS.  Per daughter, she is lucid at times but incoherent at others.  Patient is able to follow basic commands, however due to debility with AMS, will defer angiogram today as she is not likely a candidate for intervention in her current state.  Dr. [name] has discussed with her daughter who is in agreement.  Plan to d/c to rehab once medically read, then obtain CTA Head/Neck in 3 months.    Loyce Dys, MS RD PA-C

## 2020-12-02 NOTE — Progress Notes (Signed)
Physical Therapy Treatment Patient Details Name: Barbara Hill MRN: [id number] DOB: 01-13-1941 Today's Date: 12/02/2020    History of Present Illness 80 y/o female presented to ED on 4/2 after fall, s/p L cannulated hip pinning 4/7. Code stroke was initiated on 4/8. 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 PMH: dementia, anxiety, depression, brain aneurysm, CVA, CKD 3, COPD, GERD, paroxysmal SVT, normocytic anemia, orthostatic hypotension, mitral valve prolapse    PT Comments    Pt seen by PT/OT today, tolerated mobility of LLE well with minimal discomfort with bed mobility but session limited by orthostatic hypotension, pt symptomatic with dizziness. BP supine 155/66, sitting 119/66, 111/66 at 3 mins, systolic returned to 160s with return to supine. Performed SL to sit multiple times as well as LE there ex in supine and sitting but pt continued to be too symptomatic to attempt transfers or ambulation. Family present during session. RN aware of BP issues. PT will continue to follow.     Follow Up Recommendations  SNF;Supervision/Assistance - 24 hour     Equipment Recommendations  None recommended by PT    Recommendations for Other Services       Precautions / Restrictions Precautions Precautions: Fall Precaution Comments: orthostatic BP Restrictions Weight Bearing Restrictions: Yes LLE Weight Bearing: Weight bearing as tolerated    Mobility  Bed Mobility Overal bed mobility: Needs Assistance Bed Mobility: Supine to Sit;Sit to Supine     Supine to sit: +2 for physical assistance;+2 for safety/equipment;Max assist Sit to supine: Total assist;+2 for physical assistance;+2 for safety/equipment   General bed mobility comments: pt able to initiate mvmt by sliding  LE's to L when cued, and reaching for L rail with R hand when cued. Max A +2 to complete coming to EOB. Pt required tot A +2 to return to supine and manage LE's and positioning. Performed SL to sit multiple times for practice and trying to manage BP    Transfers                 General transfer comment: unable to attempt due to orthostatic BP  Ambulation/Gait             General Gait Details: pt with symptomativ orthostasis   Stairs             Wheelchair Mobility    Modified Rankin (Stroke Patients Only) Modified Rankin (Stroke Patients Only) Pre-Morbid Rankin Score: No symptoms Modified Rankin: Severe disability     Balance Overall balance assessment: Needs assistance Sitting-balance support: Feet supported Sitting balance-Leahy Scale: Poor Sitting balance - Comments: min A in sitting, due to dizziness                                    Cognition Arousal/Alertness: Lethargic;Suspect due to medications Behavior During Therapy: [facility] for tasks assessed/performed Overall Cognitive Status: Impaired/Different from baseline                                 General Comments: pt unable to state recent course of events that has happened to her but is easily reoriented to this. R gaze preference. Dementia at baseline      Exercises  General Exercises - Lower Extremity Ankle Circles/Pumps: AROM;Both;10 reps;Supine;Seated Long Arc Quad: AROM;Both;5 reps;Seated Heel Slides: Both;5 reps;Supine;AAROM    General Comments General comments (skin integrity, edema, etc.): BP supine 144/66, sit 119/66, 111/66 at 3 mins, systolic in 160's after return to supine.      Pertinent Vitals/Pain Pain Assessment: Faces Faces Pain Scale: Hurts little more Pain Location: L LE with movement Pain Descriptors / Indicators: Grimacing Pain Intervention(s): Limited activity within patient's tolerance;Monitored during session    Home Living                       Prior Function            PT Goals (current goals can now be found in the care plan section) Acute Rehab PT Goals Patient Stated Goal: did not state PT Goal Formulation: With family Time For Goal Achievement: 12/14/20 Potential to Achieve Goals: Fair Progress towards PT goals: Not progressing toward goals - comment (hypotension)    Frequency    Min 3X/week      PT Plan Current plan remains appropriate    Co-evaluation PT/OT/SLP Co-Evaluation/Treatment: Yes Reason for Co-Treatment: Complexity of the patient's impairments (multi-system involvement);Necessary to address cognition/behavior during functional activity;For patient/therapist safety PT goals addressed during session: Mobility/safety with mobility;Balance;Proper use of DME;Strengthening/ROM        AM-PAC PT "6 Clicks" Mobility   Outcome Measure  Help needed turning from your back to your side while in a flat bed without using bedrails?: A Lot Help needed moving from lying on your back to sitting on the side of a flat bed without using bedrails?: A Lot Help needed moving to and from a bed to a chair (including a wheelchair)?: A Lot Help needed standing up from a chair using your arms (e.g., wheelchair or bedside chair)?: A Lot Help needed to walk in hospital room?: Total Help needed climbing 3-5 steps with a railing? : Total 6 Click Score: 10    End of Session Equipment Utilized During Treatment: Oxygen Activity Tolerance: Treatment limited secondary to medical complications (Comment) (BP) Patient left: in bed;with call bell/phone within reach;with bed alarm set;with family/visitor present Nurse Communication: Mobility status PT Visit Diagnosis: Unsteadiness on feet (R26.81);Other abnormalities of gait and mobility (R26.89);Muscle weakness (generalized) (M62.81);Pain Pain - Right/Left: Left Pain - part of body: Hip     Time: 8115-7262 PT Time Calculation (min) (ACUTE ONLY): 24  min  Charges:  $Therapeutic Activity: 8-22 mins                     Lyanne Co, PT  Acute Rehab Services  Pager [phone] Office [phone]    Lawana Chambers Labrenda Lasky 12/02/2020, 1:06 PM

## 2020-12-02 NOTE — TOC CAGE-AID Note (Signed)
Transition of Care [facility]) - CAGE-AID Screening   Patient Details  Name: NATALLY RIBERA MRN: [id number] Date of Birth: Jan 01, 1941    Clinical Narrative: Ms. Heringer was admitted for a M2 MCA occulusion. Palliative care team now involved. Family has made patient a DNR. Pt is currently confused and un-able to participate in the substance and alcohol use questionnaire. Screening complete at this time.    CAGE-AID Screening: Substance Abuse Screening unable to be completed due to: : Patient unable to participate (pt has been made a DNR, and Pallative team involved)

## 2020-12-02 NOTE — Progress Notes (Signed)
SLP Cancellation Note  Patient Details Name: BERNARDETTE WALDRON MRN: [id number] DOB: 1940-10-18   Cancelled treatment:       Reason Eval/Treat Not Completed: Patient at procedure or test/unavailable. Note indicates pt will go for a procedure today. She still has a diet written, but diet might be being held? Will f/u tomorrow as appropriate.    Admire Bunnell, Riley Nearing 12/02/2020, 12:37 PM

## 2020-12-02 NOTE — Progress Notes (Addendum)
STROKE TEAM PROGRESS NOTE    Interval History   No acute events overnight. Neurological examination is stable.   She has prior history of right MCA pipeline stent with known restenosis and presents with recurrent right MCA branch infarct with underlying significant restenosis in the stent. She will go for diagnostic catheter angiogram scheduled with Dr. Estanislado Pandy today.   Of note, family has met with palliative care and has agreed to DNR and would even consider hospice if the patient's condition declines further.  Pertinent Lab Work and Imaging    11/29/20 CT Head WO IV Contrast 1. No acute abnormality and no change from 2 days prior 2. ASPECTS is 10 3. Atrophy and chronic ischemic changes, stable from the prior Study.  11/29/20 CT Angio Head and Neck W WO IV Contrast 1. 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 2. Approximately 15 mm area penumbra/mismatch in the posterior right MCA territory, which likely is in the territory supplied by the M2 MCA stenosis described above. No core infarct identified. 3. Streak artifact limits evaluation for right MCA in-stent stenosis; however, there is opacification proximal and distal to the stent, similar to prior and consistent with stent patency. 4. Redemonstrated flow diverter device within the M1 right MCA. 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 5. Similar 1-2 mm anteriorly projecting outpouching arising from the anterior communicating artery, which may represent a small aneurysm versus infundibulum with small associated vessel not seen.  11/29/20 MRI Brain WO IV Contrast 1. Area of restricted diffusion involving the posterior aspect of the right insula and right frontoparietal region, corresponding to area of increased T-max on prior CT perfusion, consistent with acute/subacute infarct. 2. Remote infarct in the right basal ganglia  region. 3. Moderate chronic small vessel ischemia. 4. Left maxillary sinus disease with small fluid level. Correlate for acute sinusitis. 5. Bilateral mastoid effusion.  11/19/20 Echocardiogram Complete  1. Left ventricular ejection fraction, by estimation, is 65 to 70%. The  left ventricle has normal function. The left ventricle has no regional  wall motion abnormalities. Left ventricular diastolic parameters are  indeterminate.  2. Right ventricular systolic function is normal. The right ventricular  size is normal. There is moderately elevated pulmonary artery systolic  pressure.  3. A small pericardial effusion is present. The pericardial effusion is  circumferential. There is no evidence of cardiac tamponade.  4. The mitral valve is normal in structure. Trivial mitral valve  regurgitation. No evidence of mitral stenosis.  5. The aortic valve is normal in structure. Aortic valve regurgitation is  trivial. No aortic stenosis is present.  6. The inferior vena cava is dilated in size with >50% respiratory  variability, suggesting right atrial pressure of 8 mmHg.   Physical Examination   Constitutional: Frail elderly Caucasian lady not in distress Cardiovascular: Normal RR Respiratory: No increased WOB   Mental status: Oriented to self and year. Not oriented to location or month. Follows commands ( make fist/open, close eyes/open show two fingers) Speech: Fluent w/repetition and naming intact  Cranial nerves: EOMI, VFF, Face appears symmetric, Tongue midline Motor: Normal bulk and tone. RUE 5/5 LUE 3/5 RLE 4/5 LLE 3/5 Drift to LUE + LLE Sensory: Difficult to assess, sensation seems to be intact to light touch bilaterally  Coordination: FNF intact  Gait: Deferred    NIHSS:  1a Level of Conscious: 0  1b LOC Questions: 0 1c LOC  Commands: 0 2 Best Gaze: 0 3 Visual: 0 4 Facial Palsy: 0 5a Motor Arm - left: 1 5b Motor Arm - Right: 0 6a Motor Leg - Left: 1 6b Motor Leg -  Right: 0 7 Limb Ataxia: 0 8 Sensory: 0 9 Best Language: 0 10 Dysarthria: 1 11 Extinct. and Inatten: 1  TOTAL: 4  Assessment and Plan   Barbara Hill is a 80 y.o. female w/pmh of dementia, anxiety, depression s/p R MCA stent in 7017 complicated by CVA, PVD s/p embolectomy for popliteal occlusions,CKD 3, cerebral aneurysm, COPD, GERD, paroxysmal SVT, orthostatic hypotension, mitral valve prolapse, recent hospitalization from 3/28-4/1 for CAP, fall on 4/2 with resulting left femoral neck fracture for which she underwent left cannulated hip pinning on 4/7 who presents with a R MCA stroke.   #R MCA Stroke  Patient was initially admitted on 4/7, presenting with knee pain following a fall on April 2nd, for which she underwent surgery w/left cannulated hip pinning that was done on day of admission. On 4/8 the day following surgery she developed left sided weakness w/LKW 1050, sx onset 1102. A code stroke was called. CTH w/no acute abnormality. CTA Head and Neck pertinent for focal high grade stenosis of a right proximal M2 MCA branch with distal opacification, new from prior. Streak artifact limits evaluation for right MCA in-stent stenosis; however, there is opacification proximal and distal to the stent, similar to prior and consistent with stent patency. Echo done 11/19/20 w/EF 65 to 70 %, LA normal in size, no PFO.  Stroke labs w/A1C 5.4, LDL 69. In regards to her stroke etiology, hypoperfusion in the setting of known RMCA stenosis was considered however per review of blood pressure trends she did not become hypotensive at the time of surgery. More than likely her R MCA stroke is in the setting of new right M2 MCA high grade stenosis.  - Continue DAPT for secondary stroke prevention purposes  - LDL 69, will start Rosuvastatin 10 mg QD  - DSA today w/Dr. Estanislado Pandy  #Hypertension She has a history of HTN and takes Amlodipine 5 mg QD + Losartan 25 mg QD at home. Given stenosis are recommending  permissive HTN up to 180. SBP trending 130-180.   - Do not resume home anti hypertensives to allow for permissive HTN   #Hyperlipidemia From a stroke prevention stand point, the LDL goal is < 70. LDL 69, at goal, given atherosclerosis will start Rosuvastatin 10 mg QD   Hospital day # 5  Ruta Hinds, NP  [facility] Nurse Practitioner Patient seen and discussed with attending physician Dr. Leonie Man    No neurological changes.  Patient lying comfortably in bed.  She is scheduled for diagnostic catheter angiogram today.  Dr. Estanislado Pandy to do diagnostic angiogram tomorrow.  Family has agreed to DNR after discussion with palliative care team would want to continue therapy and support for the next few days.  If condition declines further they may be willing for comfort care.   Antony Contras, MD Medical Director [place] Pager: [phone] 12/02/2020 8:28 AM       To contact Stroke Continuity provider, please refer to [email or web address] After hours, contact General Neurology

## 2020-12-02 NOTE — Plan of Care (Signed)
  Problem: Clinical Measurements: Goal: Ability to maintain clinical measurements within normal limits will improve Outcome: Progressing Goal: Will remain free from infection Outcome: Progressing Goal: Diagnostic test results will improve Outcome: Progressing Goal: Respiratory complications will improve Outcome: Progressing Goal: Cardiovascular complication will be avoided Outcome: Progressing   Problem: Activity: Goal: Risk for activity intolerance will decrease Outcome: Progressing   Problem: Elimination: Goal: Will not experience complications related to bowel motility Outcome: Progressing Goal: Will not experience complications related to urinary retention Outcome: Progressing   Problem: Pain Managment: Goal: General experience of comfort will improve Outcome: Progressing   Problem: Safety: Goal: Ability to remain free from injury will improve Outcome: Progressing   Problem: Skin Integrity: Goal: Risk for impaired skin integrity will decrease Outcome: Progressing   Problem: Education: Goal: Knowledge of patient specific risk factors addressed and post discharge goals established will improve Outcome: Progressing Goal: Individualized Educational Video(s) Outcome: Progressing   Problem: Coping: Goal: Will verbalize positive feelings about self Outcome: Progressing   Problem: Self-Care: Goal: Verbalization of feelings and concerns over difficulty with self-care will improve Outcome: Progressing Goal: Ability to communicate needs accurately will improve Outcome: Progressing   Problem: Nutrition: Goal: Risk of aspiration will decrease Outcome: Progressing   Problem: Ischemic Stroke/TIA Tissue Perfusion: Goal: Complications of ischemic stroke/TIA will be minimized Outcome: Progressing

## 2020-12-02 NOTE — Progress Notes (Signed)
PROGRESS NOTE    Barbara Hill  WUJ:[id number] DOB: 10/06/1940 DOA: 11/27/2020 PCP: Richmond CampbellKaplan, Kristen W., PA-C    Chief Complaint  Patient presents with  . Fall    Brief Narrative:  80 year old female with past medical history of dementia, anxiety, depression, s/p R MCA stent in 2017 complicated by CVA, PVD s/p embolectomy for popliteal occlusions, CKD 3, cerebral aneurysm, COPD, GERD, paroxysmal SVT, orthostatic hypotension, mitral valve prolapse, recent hospitalization from 3/28-4/1 for CAP who presented to [facility] for evaluation of knee pain following a fall on April 2. She was found to have a left hip fracture and is now status post surgery with ortho on 4/7.  Her hospitalization was complicated by delirium with left upper extremity weakness on 4/8 and code stroke was called.  She was [facility] for diagnostic cerebral angiogram on Monday to assess for possible placement of stent in severely narrowed right MCA branch with Dr. Corliss Skainseveshwar on Monday 4/11.   but in view of her debility with AMS, NIR deferred angiogram today as she is not likely a candidate for intervention in her current state.  Dr. Corliss Skainseveshwar has discussed with her daughter who is in agreement.  Plan to d/c to rehab once medically read, then obtain CTA Head/Neck in 3 months SLP evaluation recommending dysphagia 1 diet with nectar thick liquids. . Discussed with daughter at bedside and answered all her questions.   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  Acute metabolic encephalopathy probably secondary to stroke and recent surgery. As per the family patient at baseline is independent with ADLs. Minimize sedating medications and pain medications.  Code stroke called on 11/29/2020 for altered mental status and left upper extremity weakness CT of the head is negative without acute abnormality CT angiogram of the head and neck showed focal high-grade stenosis of the right proximal M2 MCA branch with distal opacification new from prior approximately 15 mm area of penumbra/mismatch in the posterior right MCA territory, likely in the territory supplied by the M2 MCA stenosis.  Neurology consulted recommended aspirin and Plavix and transferred to the [facility] for neuro interventional radiology evaluation. Neuro IR on board recommended catheter angiogram, plan for ? MCA ANGIOPLASTY.   but in view of her debility with AMS, IR deerred angiogram today as she is not likely a candidate for intervention in her current state.  Dr. Corliss Skainseveshwar has discussed with her daughter who is in agreement.  Plan to d/c to rehab once medically read, then obtain CTA Head/Neck in 3 months Hemoglobin A1c is 5.7% LDL is 69. Last echocardiogram in March 2022 showed left ventricular ejection fraction of 65 to 70%. Therapy evaluations done, recommending SNF versus home health PT. She was started on aspirin 81 mg and Plavix 75 mg daily. Modified permissive hypertensive protocol given advanced age.    Dysphagia in the setting of altered mental status and  stroke NG tube was attempted 2-3 times without success.  SLP evaluation today recommended dysphagia 1 diet with nectar thick liquids..   History of stage IIIa CKD Creatinine appears to be better than baseline now.   Hypokalemia and hypomagnesemia:  Replaced.      Anemia of chronic disease Patient's baseline hemoglobin around 10. Hemoglobin stable around 9.7.     Essential hypertension Modified permissive hypertension protocol given advanced age As needed hydralazine to keep systolic blood pressure less than 180 mmHg   Bibasilar pneumonia, possibly aspiration pneumonia.  Started the patient on Unasyn.  She remains afebrile,and leukocytosis is improving.  CXR shows bibasilar infiltrate.      DVT prophylaxis: Lovenox code Status: Full code Family Communication: None at bedside Disposition:   Status is: Inpatient  Remains inpatient appropriate because:Ongoing diagnostic testing needed not appropriate for outpatient work up, Unsafe d/c plan and IV treatments appropriate due to intensity of illness or inability to take PO   Dispo: The patient is from: Home              Anticipated d/c is to: Pending              Patient currently is not medically stable to d/c.   Difficult to place patient No       Consultants:   Neurology  Orthopedics  Procedures: None Antimicrobials: None  Subjective: Arousable, appears comfortable.  Objective: Vitals:   12/02/20 0351 12/02/20 0727 12/02/20 1121 12/02/20 1548  BP: (!) 157/90 (!) 171/71 (!) 144/63 125/61  Pulse: 88  82 96  Resp: 20 18    Temp: 98.5 F (36.9 C) 98.3 F (36.8 C)    TempSrc: Oral Oral    SpO2: 100% 100% 95% 97%  Weight:      Height:        Intake/Output Summary (Last 24 hours) at 12/02/2020 1617 Last data filed at 12/02/2020 0650 Gross per 24 hour  Intake 496.34 ml  Output --  Net 496.34 ml   Filed Weights   11/27/20 1419  Weight: 42.3 kg    Examination:  General exam: Elderly woman,  Alert and comfortable,  Respiratory system: diminished at baseline, no wheezing or rhonchi.  Cardiovascular system: S1S2, RRR, no JVD,  Gastrointestinal system: Abdomen is soft, non tender non distended bowel sounds wnl.  Central nervous system: pt follows commands.  Very deconditioned.  Extremities: no pedal edema.  Skin: No rashes seen.   psychiatry: cannot be assessed.     Data Reviewed: I have personally reviewed following labs and imaging studies  CBC: Recent Labs  Lab 11/27/20 1313 11/28/20 0826 11/29/20 0409 11/30/20 0313 11/30/20 1239  WBC 17.0* 18.6* 18.0* 13.1* 13.4*  NEUTROABS  --   --   --   --  10.4*  HGB 12.4 11.6* 10.2* 10.3* 9.7*  HCT 37.3 35.0* 32.1* 30.9* 28.8*  MCV 89.7 90.7 94.4 92.0 91.1  PLT 617* 528* 459* 389 377    Basic Metabolic Panel: Recent Labs  Lab 11/28/20 0826 11/29/20 0409 11/30/20 0313 11/30/20 1239 12/01/20 0237 12/02/20 0252  NA 138 134* 135 134* 135 132*  K 3.5 4.2 3.2* 2.7* 2.6* 4.0  CL 101 99 99 101 99 100  CO2 25 24 29 27 28 25   GLUCOSE 107* 98 114* 138* 98 95  BUN 20 20 11 9 8 10   CREATININE 0.87 0.85 0.74 0.65 0.64 0.76  CALCIUM 9.6 9.3 9.2 8.8* 8.7* 9.0  MG 1.5*  --   --  1.2*  --  2.1    GFR: Estimated Creatinine Clearance: 38.7 mL/min (by C-G formula based on SCr of 0.76 mg/dL).  Liver Function Tests: No results for input(s): AST, ALT, ALKPHOS, BILITOT, PROT, ALBUMIN in the last 168 hours.  CBG: Recent Labs  Lab 11/29/20 1133 11/30/20 0609 11/30/20 2133  GLUCAP 100* 118* 120*     Recent Results (from the past 240 hour(s))  Resp Panel by RT-PCR (Flu A&B, Covid) Nasopharyngeal Swab     Status: None   Collection Time: 11/27/20  3:39 PM   Specimen: Nasopharyngeal Swab; Nasopharyngeal(NP) swabs in vial transport medium  Result Value Ref Range Status   SARS Coronavirus 2 by RT PCR NEGATIVE NEGATIVE Final    Comment: (NOTE) SARS-CoV-2 target nucleic acids are NOT DETECTED.  The SARS-CoV-2 RNA is generally detectable in upper respiratory specimens during the acute phase of infection. The lowest concentration of SARS-CoV-2 viral copies this assay can detect is 138 copies/mL. A negative result does not preclude SARS-Cov-2 infection and should not be used as the sole basis  for treatment or other patient management decisions. A negative result may occur with  improper specimen collection/handling, submission of specimen other than nasopharyngeal swab, presence of viral mutation(s) within the areas targeted by this assay, and inadequate number of viral copies(<138 copies/mL). A negative result must be combined with clinical observations, patient history, and epidemiological information. The expected result is Negative.  Fact Sheet for Patients:  BloggerCourse.com  Fact Sheet for Healthcare Providers:  SeriousBroker.it  This test is no t yet approved or cleared by the [place] FDA and  has been authorized for detection and/or diagnosis of SARS-CoV-2 by FDA under an Emergency Use Authorization (EUA). This EUA will remain  in effect (meaning this test can be used) for the duration of the COVID-19 declaration under Section 564(b)(1) of the Act, 21 U.S.C.section 360bbb-3(b)(1), unless the authorization is terminated  or revoked sooner.       Influenza A by PCR NEGATIVE NEGATIVE Final   Influenza B by PCR NEGATIVE NEGATIVE Final    Comment: (NOTE) The Xpert Xpress SARS-CoV-2/FLU/RSV plus assay is intended as an aid in the diagnosis of influenza from Nasopharyngeal swab specimens and should not be used as a sole basis for treatment. Nasal washings and aspirates are unacceptable for Xpert Xpress SARS-CoV-2/FLU/RSV testing.  Fact Sheet for Patients: BloggerCourse.com  Fact Sheet for Healthcare Providers: SeriousBroker.it  This test is not yet approved or cleared by the [place] FDA and has been authorized for detection and/or diagnosis of SARS-CoV-2 by FDA under an Emergency Use Authorization (EUA). This EUA will remain in effect (meaning this test can be used) for the duration of the COVID-19 declaration under Section 564(b)(1) of the Act, 21  U.S.C. section 360bbb-3(b)(1), unless the authorization is terminated or revoked.  Performed at [facility], [address] [place], [place] [address]   Surgical PCR screen     Status: None   Collection Time: 11/28/20 11:58 AM   Specimen: Nasal Mucosa; Nasal Swab  Result Value Ref Range Status   MRSA, PCR NEGATIVE NEGATIVE Final   Staphylococcus aureus NEGATIVE NEGATIVE Final    Comment: (NOTE) The Xpert SA Assay (FDA approved for NASAL specimens in patients 77 years of age and older), is one component of a comprehensive surveillance program. It is not intended to diagnose infection nor to guide or monitor treatment. Performed at [facility], [address], [place], [place] [address]  Radiology Studies: DG Chest 2 View  Result Date: 12/01/2020 CLINICAL DATA:  Shortness of breath. EXAM: CHEST - 2 VIEW COMPARISON:  November 29, 2020 FINDINGS: The lungs are hyperinflated. Persistent areas of bibasilar atelectasis and/or infiltrate are noted. Mild, stable left-sided volume loss is seen. There is no evidence of a pleural effusion or pneumothorax. The heart size and mediastinal contours are within normal limits. The visualized skeletal structures are unremarkable. IMPRESSION: Persistent bibasilar atelectasis and/or infiltrate. Electronically Signed   By: Aram Candela M.D.   On: 12/01/2020 20:38        Scheduled Meds: .  stroke: mapping our early stages of recovery book   Does not apply Once  . aspirin  81 mg Oral Daily   Or  . aspirin  300 mg Rectal Daily  . Chlorhexidine Gluconate Cloth  6 each Topical Daily  . clopidogrel  75 mg Oral Daily  . docusate  100 mg Oral BID  . enoxaparin (LOVENOX) injection  40 mg Subcutaneous Q24H  . famotidine  20 mg Oral Daily  . feeding supplement  237 mL Oral TID BM  . ferrous sulfate  325 mg Oral TID PC  . fluticasone furoate-vilanterol  1 puff Inhalation Daily  . multivitamin with minerals  1  tablet Oral Daily  . mupirocin ointment  1 application Nasal BID  . pantoprazole  40 mg Oral Daily  . senna  1 tablet Oral BID   Continuous Infusions: . sodium chloride 10 mL/hr at 11/30/20 1640  . ampicillin-sulbactam (UNASYN) IV 1.5 g (12/02/20 1259)     LOS: 5 days      Kathlen Mody, MD [facility]   To contact the attending provider between 7A-7P or the covering provider during after hours 7P-7A, please log into the web site [email or web address] and access using universal [place] password for that web site. If you do not have the password, please call the hospital operator.  12/02/2020, 4:17 PM

## 2020-12-02 NOTE — Progress Notes (Signed)
Daughter at bedside asking about IR procedure, MD paged

## 2020-12-02 NOTE — Progress Notes (Signed)
Occupational Therapy Treatment Patient Details Name: Barbara Hill MRN: [id number] DOB: [birth year] Today's Date: 12/02/2020    History of present illness 80 y/o female presented to ED on 4/2 after fall, s/p L cannulated hip pinning 4/7. Code stroke was initiated on 4/8. 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 PMH: dementia, anxiety, depression, brain aneurysm, CVA, CKD 3, COPD, GERD, paroxysmal SVT, normocytic anemia, orthostatic hypotension, mitral valve prolapse   OT comments  Patient agreeable to OT/PT session.  Patient requires max assist +2 to transition to EOB, seated EOB with min guard to min assist but limited by dizziness.  BP monitored and pt + orthostatic hypotension- see below for details.  Requires total assist for LB dressing.  Encouraged family to sit on pts L side due to inattention, able to scan with cueing.  Will follow acutely.    Follow Up Recommendations  SNF    Equipment Recommendations  None recommended by OT    Recommendations for Other Services      Precautions / Restrictions Precautions Precautions: Fall Precaution Comments: orthostatic BP Restrictions Weight Bearing Restrictions: Yes LLE Weight Bearing: Weight bearing as tolerated       Mobility Bed Mobility Overal bed mobility: Needs Assistance Bed Mobility: Supine to Sit;Sit to Supine     Supine to sit: +2 for physical assistance;+2 for safety/equipment;Max assist Sit to supine: Total assist;+2 for physical assistance;+2 for safety/equipment   General bed mobility comments: pt able to initiate mvmt by sliding LE's to L when cued, and reaching for L rail with R hand when cued. Max A +2 to complete coming to EOB. Pt required tot A +2 to return to supine and manage LE's and  positioning. Performed SL to sit multiple times for practice and trying to manage BP    Transfers                 General transfer comment: unable to attempt due to orthostatic BP    Balance Overall balance assessment: Needs assistance Sitting-balance support: No upper extremity supported;Feet supported Sitting balance-Leahy Scale: Fair Sitting balance - Comments: min guard to min assist in sitting due to dizziness, dynamic balance NT                                   ADL either performed or assessed with clinical judgement   ADL Overall ADL's : Needs assistance/impaired                     Lower Body Dressing: Total assistance;Sitting/lateral leans;Sit to/from stand;+2 for physical assistance     Toilet Transfer Details (indicate cue type and reason): deferred due to orthostatic         Functional mobility during ADLs: Maximal assistance;+2 for physical assistance;+2 for safety/equipment General ADL Comments: pt limited by weakness, L hip pain, balance and orthostatic hypotension     Vision   Additional Comments: able to scan towards L side when cued, contiue visual assessment   Perception     Praxis      Cognition Arousal/Alertness: Lethargic;Suspect due to medications Behavior During Therapy: [facility] for tasks assessed/performed Overall Cognitive Status: History of cognitive impairments - at baseline Area of Impairment: Following commands;Awareness;Problem solving;Attention  Current Attention Level: Sustained   Following Commands: Follows one step commands consistently;Follows one step commands with increased time;Follows multi-step commands inconsistently   Awareness: Intellectual Problem Solving: Difficulty sequencing;Requires verbal cues;Slow processing;Decreased initiation;Requires tactile cues General Comments: pt with hx of cog deficits but oriented to self but not situation, follows commands with increased  time and mulitmodal cueing. L inattention        Exercises Exercises: General Lower Extremity General Exercises - Lower Extremity Ankle Circles/Pumps: AROM;Both;10 reps;Supine;Seated Long Arc Quad: AROM;Both;5 reps;Seated Heel Slides: Both;5 reps;Supine;AAROM   Shoulder Instructions       General Comments BP supine 144/66, sit 119/66, 111/66 at 3 mins, systolic in 160's after return to supine. Educated family on importance of L UE on pillow and encourage use of L UE functionally, staying on her L side to increase awareness.    Pertinent Vitals/ Pain       Pain Assessment: Faces Faces Pain Scale: Hurts little more Pain Location: L LE with movement Pain Descriptors / Indicators: Grimacing Pain Intervention(s): Limited activity within patient's tolerance;Monitored during session;Repositioned  Home Living                                          Prior Functioning/Environment              Frequency  Min 2X/week        Progress Toward Goals  OT Goals(current goals can now be found in the care plan section)  Progress towards OT goals: Progressing toward goals  Acute Rehab OT Goals Patient Stated Goal: did not state  Plan Discharge plan remains appropriate;Frequency remains appropriate    Co-evaluation    PT/OT/SLP Co-Evaluation/Treatment: Yes Reason for Co-Treatment: For patient/therapist safety;Complexity of the patient's impairments (multi-system involvement);To address functional/ADL transfers PT goals addressed during session: Mobility/safety with mobility;Balance;Proper use of DME;Strengthening/ROM OT goals addressed during session: ADL's and self-care      AM-PAC OT "6 Clicks" Daily Activity     Outcome Measure   Help from another person eating meals?: A Little Help from another person taking care of personal grooming?: A Little Help from another person toileting, which includes using toliet, bedpan, or urinal?: Total Help from another  person bathing (including washing, rinsing, drying)?: Total Help from another person to put on and taking off regular upper body clothing?: A Lot Help from another person to put on and taking off regular lower body clothing?: Total 6 Click Score: 11    End of Session    OT Visit Diagnosis: Unsteadiness on feet (R26.81);Muscle weakness (generalized) (M62.81);Pain;Other symptoms and signs involving cognitive function Pain - Right/Left: Left Pain - part of body: Hip   Activity Tolerance Treatment limited secondary to medical complications (Comment) (orthostatic hypotension)   Patient Left in bed;with call bell/phone within reach;with bed alarm set;with family/visitor present   Nurse Communication Mobility status;Precautions        Time: 9833-8250 OT Time Calculation (min): 24 min  Charges: OT General Charges $OT Visit: 1 Visit OT Treatments $Self Care/Home Management : 8-22 mins  Barry Brunner, OT Acute Rehabilitation Services Pager [phone] Office [phone]    Chancy Milroy 12/02/2020, 1:23 PM

## 2020-12-02 NOTE — Progress Notes (Signed)
Nutrition Follow-up  DOCUMENTATION CODES:   Severe malnutrition in context of chronic illness,Underweight  INTERVENTION:   Monitor for bowel movement. None documented since 4/5. Recommend initiation of bowel regimen.   Ensure Enlive po TID, each supplement provides 350 kcal and 20 grams of protein, thickened to appropriate consistency  Magic cup TID with meals, each supplement provides 290 kcal and 9 grams of protein  MVI with minerals daily  NUTRITION DIAGNOSIS:   Severe Malnutrition related to chronic illness (dementia) as evidenced by severe fat depletion,severe muscle depletion.  ongoing  GOAL:   Patient will meet greater than or equal to 90% of their needs  progressing  MONITOR:   Diet advancement,Labs,Weight trends  REASON FOR ASSESSMENT:   Malnutrition Screening Tool,Consult Hip fracture protocol  ASSESSMENT:   80 year old female with medical history of dementia, anxiety, depression, CVA, stage 3 CKD, cerebral aneurysm, COPD, GERD, orthostatic hypotension, mitral valve prolapse, and hospitalization from 3/28-4/1 for CAP. She presented to the ED due to knee pain subsequent to a fall 4/2. Xray in the ED showed L hip fx.  4/7 s/p L cannulated hip pinning  4/8 code stroke called for AMS and upper extremity weakness 4/9 dysphagia 1 diet with nectar thick liquids ordered 4/11 transferred to [facility] for diagnostic cerebral angiogram to assess for possible placement of stent in severely narrowed  R MCA branch.   Pt has had very poor intake since admit, though there has been very limited meal documentation. 3 meals recorded as 10% completion. Will order oral nutrition supplements in hopes of increasing pt's protein/kcal intake. Note that PMT has met with pt/family and they would be interested in a trial of TF if necessary.   No UOP documented.   Monitor for bowel movement. None documented since 4/5. Recommend initiation of bowel regimen.   Medications: colace, pepcid,  protonix, senokot, ferrous sulfate Labs: Na 132 (L)  Diet Order:   Diet Order            DIET - DYS 1 Room service appropriate? No; Fluid consistency: Nectar Thick  Diet effective now                 EDUCATION NEEDS:   No education needs have been identified at this time  Skin:  Skin Assessment: Skin Integrity Issues: Skin Integrity Issues:: Incisions Incisions: hip  Last BM:  4/5  Height:   Ht Readings from Last 1 Encounters:  11/27/20 _0  (1.6 m)    Weight:   Wt Readings from Last 1 Encounters:  11/27/20 42.3 kg    Ideal Body Weight:  52.27 kg  BMI:  Body mass index is 16.53 kg/m.  Estimated Nutritional Needs:   Kcal:  1600-1800 kcal  Protein:  70-85 grams  Fluid:  >/= 1.7 L/day    Larkin Ina, MS, RD, LDN RD pager number and weekend/on-call pager number located in [place].

## 2020-12-02 NOTE — Progress Notes (Signed)
Subjective: 4 Days Post-Op s/p Procedure(s): CANNULATED HIP PINNING  Left hip pain moderate this morning. Denies CP, SOB, numbness or tingling, pain in other joints.  Objective:  PE: VITALS:   Vitals:   12/01/20 2008 12/01/20 2338 12/02/20 0351 12/02/20 0727  BP: (!) 148/74 (!) 170/77 (!) 157/90 (!) 171/71  Pulse: 86 85 88   Resp: 17 17 20 18   Temp: 98.2 F (36.8 C) 98.7 F (37.1 C) 98.5 F (36.9 C) 98.3 F (36.8 C)  TempSrc: Oral Oral Oral Oral  SpO2: 97% 97% 100% 100%  Weight:      Height:       General: Alert, elderly female, no acute distress,sitting up in bed.Oriented to self only.  MUSCULOSKELETAL:Surgical dressing intact, no drainage.Dorsiflexion and plantarflexion intact bilaterally. Patient able to move all toes of bilateral feet. Patient endorses sensation to all aspects of bilateral feet. 2+ DP pulses. No ecchymosis noted at left leg, moderate TTP to left proximal femur.   LABS  Results for orders placed or performed during the hospital encounter of 11/27/20 (from the past 24 hour(s))  Vitamin B12     Status: Abnormal   Collection Time: 12/01/20 10:23 AM  Result Value Ref Range   Vitamin B-12 1,379 (H) 180 - 914 pg/mL  Iron and TIBC     Status: Abnormal   Collection Time: 12/01/20 10:23 AM  Result Value Ref Range   Iron 27 (L) 28 - 170 ug/dL   TIBC 01/31/21 (L) 332 - 951 ug/dL   Saturation Ratios 15 10.4 - 31.8 %   UIBC 158 ug/dL  Ferritin     Status: Abnormal   Collection Time: 12/01/20 10:23 AM  Result Value Ref Range   Ferritin 409 (H) 11 - 307 ng/mL  Reticulocytes     Status: Abnormal   Collection Time: 12/01/20 10:23 AM  Result Value Ref Range   Retic Ct Pct 1.8 0.4 - 3.1 %   RBC. 3.73 (L) 3.87 - 5.11 MIL/uL   Retic Count, Absolute 65.6 19.0 - 186.0 K/uL   Immature Retic Fract 10.9 2.3 - 15.9 %  Folate     Status: None   Collection Time: 12/01/20 10:23 AM  Result Value Ref Range   Folate 28.5 >5.9 ng/mL  Basic metabolic panel      Status: Abnormal   Collection Time: 12/02/20  2:52 AM  Result Value Ref Range   Sodium 132 (L) 135 - 145 mmol/L   Potassium 4.0 3.5 - 5.1 mmol/L   Chloride 100 98 - 111 mmol/L   CO2 25 22 - 32 mmol/L   Glucose, Bld 95 70 - 99 mg/dL   BUN 10 8 - 23 mg/dL   Creatinine, Ser 02/01/21 0.44 - 1.00 mg/dL   Calcium 9.0 8.9 - 1.66 mg/dL   GFR, Estimated 06.3 >01 mL/min   Anion gap 7 5 - 15  Magnesium     Status: None   Collection Time: 12/02/20  2:52 AM  Result Value Ref Range   Magnesium 2.1 1.7 - 2.4 mg/dL    DG Chest 2 View  Result Date: 12/01/2020 CLINICAL DATA:  Shortness of breath. EXAM: CHEST - 2 VIEW COMPARISON:  November 29, 2020 FINDINGS: The lungs are hyperinflated. Persistent areas of bibasilar atelectasis and/or infiltrate are noted. Mild, stable left-sided volume loss is seen. There is no evidence of a pleural effusion or pneumothorax. The heart size and mediastinal contours are within normal limits. The visualized skeletal structures are unremarkable. IMPRESSION: Persistent  bibasilar atelectasis and/or infiltrate. Electronically Signed   By: Aram Candela M.D.   On: 12/01/2020 20:38    Assessment/Plan: Left femoral neck fracture: 4 Days Post-Op s/p Procedure(s): CANNULATED HIP PINNING - stable from orthopedic standpoint Weightbearing: WBAT LLE Insicional and dressing care: Reinforce dressings as needed VTE prophylaxis: Plan for lovenox 40mg  qd for 4 weeks, on Plavix and aspirin per neurology Pain control: per primary Follow - up plan: 2 weeks with Dr. after discharge Dispo: Not stable for discharge at this time. PT recommending SNF vs HHPT with 24 hour supervision. Patient does have good support at home, lives with daughter and her family. Would recommend repeat OT and PT evals after angiogram and other interventions, prior to discharge.  Will continue to follow along intermittently while she remains inpatient, please reach out for any urgent needs.   Contact  information:   Weekdays 8-5 04-27-2006, Janine Ores [place] A fter hours and holidays please check [email or web address] for group call information for [facility]  [phone] 12/02/2020, 7:51 AM

## 2020-12-03 DIAGNOSIS — R1319 Other dysphagia: Secondary | ICD-10-CM | POA: Diagnosis not present

## 2020-12-03 DIAGNOSIS — N1831 Chronic kidney disease, stage 3a: Secondary | ICD-10-CM | POA: Diagnosis not present

## 2020-12-03 DIAGNOSIS — S72002D Fracture of unspecified part of neck of left femur, subsequent encounter for closed fracture with routine healing: Secondary | ICD-10-CM | POA: Diagnosis not present

## 2020-12-03 DIAGNOSIS — R627 Adult failure to thrive: Secondary | ICD-10-CM

## 2020-12-03 DIAGNOSIS — I63311 Cerebral infarction due to thrombosis of right middle cerebral artery: Secondary | ICD-10-CM | POA: Diagnosis not present

## 2020-12-03 MED ORDER — RESOURCE THICKENUP CLEAR PO POWD: ORAL | 2 refills | Status: AC

## 2020-12-03 MED ORDER — ASPIRIN 81 MG PO CHEW: 81.0000 mg | CHEWABLE_TABLET | Freq: Every day | ORAL | 1 refills | Status: AC

## 2020-12-03 MED ORDER — FAMOTIDINE 20 MG PO TABS: 20.0000 mg | ORAL_TABLET | Freq: Every day | ORAL | Status: AC

## 2020-12-03 MED ORDER — POTASSIUM CHLORIDE CRYS ER 20 MEQ PO TBCR
40.0000 meq | EXTENDED_RELEASE_TABLET | Freq: Once | ORAL | Status: AC
  Administered 2020-12-03: 40 meq via ORAL
  Filled 2020-12-03: qty 2

## 2020-12-03 MED ORDER — SENNA 8.6 MG PO TABS: 1.0000 | ORAL_TABLET | Freq: Two times a day (BID) | ORAL | 0 refills | Status: AC

## 2020-12-03 NOTE — Plan of Care (Signed)
  Problem: Education: Goal: Knowledge of General Education information will improve Description: Including pain rating scale, medication(s)/side effects and non-pharmacologic comfort measures Outcome: Progressing   Problem: Health Behavior/Discharge Planning: Goal: Ability to manage health-related needs will improve Outcome: Progressing   Problem: Clinical Measurements: Goal: Ability to maintain clinical measurements within normal limits will improve Outcome: Progressing Goal: Will remain free from infection Outcome: Progressing Goal: Diagnostic test results will improve Outcome: Progressing Goal: Respiratory complications will improve Outcome: Progressing Goal: Cardiovascular complication will be avoided Outcome: Progressing   Problem: Activity: Goal: Risk for activity intolerance will decrease Outcome: Progressing   Problem: Nutrition: Goal: Adequate nutrition will be maintained Outcome: Progressing   Problem: Coping: Goal: Level of anxiety will decrease Outcome: Progressing   Problem: Elimination: Goal: Will not experience complications related to bowel motility Outcome: Progressing Goal: Will not experience complications related to urinary retention Outcome: Progressing   Problem: Pain Managment: Goal: General experience of comfort will improve Outcome: Progressing   Problem: Safety: Goal: Ability to remain free from injury will improve Outcome: Progressing   Problem: Skin Integrity: Goal: Risk for impaired skin integrity will decrease Outcome: Progressing   Problem: Education: Goal: Knowledge of disease or condition will improve Outcome: Progressing Goal: Knowledge of secondary prevention will improve Outcome: Progressing Goal: Knowledge of patient specific risk factors addressed and post discharge goals established will improve Outcome: Progressing Goal: Individualized Educational Video(s) Outcome: Progressing   Problem: Coping: Goal: Will verbalize  positive feelings about self Outcome: Progressing   Problem: Self-Care: Goal: Ability to participate in self-care as condition permits will improve Outcome: Progressing Goal: Verbalization of feelings and concerns over difficulty with self-care will improve Outcome: Progressing Goal: Ability to communicate needs accurately will improve Outcome: Progressing   Problem: Nutrition: Goal: Risk of aspiration will decrease Outcome: Progressing Goal: Dietary intake will improve Outcome: Progressing   Problem: Ischemic Stroke/TIA Tissue Perfusion: Goal: Complications of ischemic stroke/TIA will be minimized Outcome: Progressing   

## 2020-12-03 NOTE — Progress Notes (Signed)
Patient ID: Barbara Hill, female   DOB: July 17, 1941, 80 y.o.   MRN: [id number]   Medical records reviewed   80 year old female with past medical history of dementia, anxiety, depression,s/p R MCA stent in 2017 complicated by CVA, PVD s/p embolectomy for popliteal occlusions,CKD 3, cerebral aneurysm, COPD, GERD, paroxysmal SVT, orthostatic hypotension, mitral valve prolapse, recent hospitalization from 3/28-4/1 for CAP who presented to [facility] for evaluation of knee pain following a fall on April 2.She was found to have a left hip fracture and is now status post surgery with ortho on 4/7. Her hospitalization was complicated by delirium with left upper extremity weakness on 4/8 and code stroke was called. She was transferred to [facility] for diagnostic cerebral angiogram on Monday to assess for possible placement of stent in severely narrowed right MCA branchwith Dr. [name] on Monday 4/11.  but in view of her debility with AMS, NIR deferred angiogram today as she is not likely a candidate for intervention in her current state. Dr. [name] has discussed with her daughter who is in agreement. Plan to d/c to rehab once medically read, then obtain CTA Head/Neck in 3 months SLP evaluation recommending dysphagia 1 diet with nectar thick liquids. . Discussed with daughter at bedside and answered all her questions.  This NP visited patient at the bedside as a follow up to  yesterday's GOCs meeting. Continued conversation regarding current medical situation.  Education offered on the natural trajectory of adult failure to thrive and dementia.  Education offered on the patient's high risk for aspiration, we discussed aspiration precautions.  Ultimately focus of care for this patient is comfort,  quality and dignity.  Daughter wishes for patient to be discharged home when medically stable.  Plan of care - DNR/DNI - No artificial feeding now or in the future - No desire for SNF for rehabilitation - Family  is hopeful for patient to return home with hospice services/[facility] hosp[facility] - Continue adjusting care plan depending on patient outcomes with the guidance of hospice/prognosis is likely less than 6 months   Discussed with daughter  the importance of continued conversation with all family and the  medical providers regarding overall plan of care and treatment options,  ensuring decisions are within the context of the patients values and GOCs.  Questions and concerns addressed   Discussed with Dr Blake Divine  Total time spent on the unit was 35 minutes  Greater than 50% of the time was spent in counseling and coordination of care  Lorinda Creed NP  Palliative Medicine Team Team Phone # [phone] Pager [phone]

## 2020-12-03 NOTE — Progress Notes (Signed)
PROGRESS NOTE    VICY MEDICO  TIW:[id number] DOB: Nov 23, 1940 DOA: 11/27/2020 PCP: Richmond Campbell., PA-C    Chief Complaint  Patient presents with  . Fall    Brief Narrative:  80 year old female with past medical history of dementia, anxiety, depression, s/p R MCA stent in 2017 complicated by CVA, PVD s/p embolectomy for popliteal occlusions, CKD 3, cerebral aneurysm, COPD, GERD, paroxysmal SVT, orthostatic hypotension, mitral valve prolapse, recent hospitalization from 3/28-4/1 for CAP who presented to [facility] for evaluation of knee pain following a fall on April 2. She was found to have a left hip fracture and is now status post surgery with ortho on 4/7.  Her hospitalization was complicated by delirium with left upper extremity weakness on 4/8 and code stroke was called.  She was transferred to [facility] for diagnostic cerebral angiogram on Monday to assess for possible placement of stent in severely narrowed right MCA branch with Dr. [name] on Monday 4/11.   but in view of her debility with AMS, NIR deferred angiogram today as she is not likely a candidate for intervention in her current state.  Dr. [name] has discussed with her daughter who is in agreement.  Plan to d/c to rehab once medically read, then obtain CTA Head/Neck in 3 months SLP evaluation recommending dysphagia 1 diet with nectar thick liquids. . Discussed with daughter at bedside and answered all her questions.   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  Acute metabolic encephalopathy probably secondary to stroke and recent surgery. As per the family patient at baseline is independent with ADLs. Minimize sedating medications and pain medications.  Code stroke called on 11/29/2020 for altered mental status and left upper extremity weakness CT of the head is negative without acute abnormality CT angiogram of the head and neck showed focal high-grade stenosis of the right proximal M2 MCA branch with distal opacification new from prior approximately 15 mm area of penumbra/mismatch in the posterior right MCA territory, likely in the territory supplied by the M2 MCA stenosis.  Neurology consulted recommended aspirin and Plavix and transferred to the [facility] for neuro interventional radiology evaluation. Neuro IR on board recommended catheter angiogram, possible? MCA ANGIOPLASTY.   but in view of her debility with AMS, IR deferred angiogram  as she is not likely a candidate for intervention in her current state.  Dr. [name] has discussed with her daughter who is in agreement.  Plan to d/c to home  once medically read, then obtain CTA Head/Neck in 3 months.  Hemoglobin A1c is 5.7% LDL is 69. Last echocardiogram in March 2022 showed left ventricular ejection fraction of 65 to 70%. Therapy evaluations done, recommending SNF versus home health PT. But after meeting with palliative care , family decided for home hospice in am.  She was started on aspirin 81 mg and Plavix 75 mg daily. Modified permissive hypertensive protocol  given advanced age.    Dysphagia in the setting of altered mental status and stroke NG tube was attempted 2-3 times without success.  SLP evaluation today recommended dysphagia 1 diet with nectar thick liquids..   History of stage IIIa CKD Creatinine appears to  be better than baseline now.   Hypokalemia and hypomagnesemia:  Replaced.     Anemia of chronic disease Patient's baseline hemoglobin around 10. Hemoglobin stable around 9.7.     Essential hypertension Modified permissive hypertension protocol given advanced age BP parameters are much better .    Bibasilar pneumonia, possibly aspiration pneumonia.  Completed the course of unasyn for pneumonia.  She remains afebrile,and leukocytosis is improving.  CXR shows bibasilar infiltrate.      DVT prophylaxis: Lovenox code Status: Full code Family Communication: None at bedside Disposition:   Status is: Inpatient  Remains inpatient appropriate because:Ongoing diagnostic testing needed not appropriate for outpatient work up, Unsafe d/c plan and IV treatments appropriate due to intensity of illness or inability to take PO   Dispo: The patient is from: Home              Anticipated d/c is to: Pending              Patient currently is not medically stable to d/c.   Difficult to place patient No       Consultants:   Neurology  Orthopedics  Procedures: None Antimicrobials: None  Subjective: No new complaints.  Objective: Vitals:   12/03/20 0502 12/03/20 0829 12/03/20 0837 12/03/20 1214  BP: (!) 156/85 (!) 161/75  (!) 164/76  Pulse:  74  82  Resp:  18  16  Temp:  98.2 F (36.8 C)  98.5 F (36.9 C)  TempSrc:  Oral  Oral  SpO2:  95% 93% 96%  Weight:      Height:        Intake/Output Summary (Last 24 hours) at 12/03/2020 1429 Last data filed at 12/03/2020 0830 Gross per 24 hour  Intake 581.22 ml  Output 600 ml  Net -18.78 ml   Filed Weights   11/27/20 1419  Weight: 42.3 kg    Examination:  Pt alert and comfortable, confused.  CVS S1S2 RRR no JVD,  Lungs clear to auscultation, no wheezing or rhonchi.  Abdomen is soft NT ND BS+ EXT no pedal edema  Neuro pt alert but confused.     Data Reviewed: I have personally reviewed following labs and imaging  studies  CBC: Recent Labs  Lab 11/27/20 1313 11/28/20 0826 11/29/20 0409 11/30/20 0313 11/30/20 1239  WBC 17.0* 18.6* 18.0* 13.1* 13.4*  NEUTROABS  --   --   --   --  10.4*  HGB 12.4 11.6* 10.2* 10.3* 9.7*  HCT 37.3 35.0* 32.1* 30.9* 28.8*  MCV 89.7 90.7 94.4 92.0 91.1  PLT 617* 528* 459* 389 377    Basic Metabolic Panel: Recent Labs  Lab 11/28/20 0826 11/29/20 0409 11/30/20 0313 11/30/20 1239 12/01/20 0237 12/02/20 0252 12/02/20 1805  NA 138   < > 135 134* 135 132* 134*  K 3.5   < > 3.2* 2.7* 2.6* 4.0 3.3*  CL 101   < > 99 101 99 100 99  CO2 25   < > 29 27 28 25 28   GLUCOSE 107*   < > 114* 138* 98 95 101*  BUN 20   < > 11 9 8 10 14   CREATININE 0.87   < > 0.74 0.65 0.64 0.76 0.80  CALCIUM 9.6   < > 9.2 8.8* 8.7* 9.0 9.3  MG 1.5*  --   --  1.2*  --  2.1  --    < > = values in this interval not displayed.    GFR: Estimated Creatinine Clearance: 38.7 mL/min (by C-G formula based on SCr of 0.8 mg/dL).  Liver Function Tests: No results for input(s): AST, ALT, ALKPHOS, BILITOT, PROT, ALBUMIN in the last 168 hours.  CBG: Recent Labs  Lab 11/29/20 1133 11/30/20 0609 11/30/20 2133  GLUCAP 100* 118* 120*     Recent Results (from the past 240 hour(s))  Resp Panel by RT-PCR (Flu A&B, Covid) Nasopharyngeal Swab     Status: None   Collection Time: 11/27/20  3:39 PM   Specimen: Nasopharyngeal Swab; Nasopharyngeal(NP) swabs in vial transport medium  Result Value Ref Range Status   SARS Coronavirus 2 by RT PCR NEGATIVE NEGATIVE Final    Comment: (NOTE) SARS-CoV-2 target nucleic acids are NOT DETECTED.  The SARS-CoV-2 RNA is generally detectable in upper respiratory specimens during the acute phase of infection. The lowest concentration of SARS-CoV-2 viral copies this assay can detect is 138 copies/mL. A negative result does not preclude SARS-Cov-2 infection and should not be used as the sole basis for treatment or other patient management decisions. A negative  result may occur with  improper specimen collection/handling, submission of specimen other than nasopharyngeal swab, presence of viral mutation(s) within the areas targeted by this assay, and inadequate number of viral copies(<138 copies/mL). A negative result must be combined with clinical observations, patient history, and epidemiological information. The expected result is Negative.  Fact Sheet for Patients:  BloggerCourse.com  Fact Sheet for Healthcare Providers:  SeriousBroker.it  This test is no t yet approved or cleared by the [place] FDA and  has been authorized for detection and/or diagnosis of SARS-CoV-2 by FDA under an Emergency Use Authorization (EUA). This EUA will remain  in effect (meaning this test can be used) for the duration of the COVID-19 declaration under Section 564(b)(1) of the Act, 21 U.S.C.section 360bbb-3(b)(1), unless the authorization is terminated  or revoked sooner.       Influenza A by PCR NEGATIVE NEGATIVE Final   Influenza B by PCR NEGATIVE NEGATIVE Final    Comment: (NOTE) The Xpert Xpress SARS-CoV-2/FLU/RSV plus assay is intended as an aid in the diagnosis of influenza from Nasopharyngeal swab specimens and should not be used as a sole basis for treatment. Nasal washings and aspirates are unacceptable for Xpert Xpress SARS-CoV-2/FLU/RSV testing.  Fact Sheet for Patients: BloggerCourse.com  Fact Sheet for Healthcare Providers: SeriousBroker.it  This test is not yet approved or cleared by the [place] FDA and has been authorized for detection and/or diagnosis of SARS-CoV-2 by FDA under an Emergency Use Authorization (EUA). This EUA will remain in effect (meaning this test can be used) for the duration of the COVID-19 declaration under Section 564(b)(1) of the Act, 21 U.S.C. section 360bbb-3(b)(1), unless the authorization is  terminated or revoked.  Performed at [facility], [address], [place], [place] [address]   Surgical PCR screen     Status: None   Collection Time: 11/28/20 11:58 AM   Specimen: Nasal Mucosa; Nasal Swab  Result Value Ref Range Status   MRSA, PCR NEGATIVE NEGATIVE Final   Staphylococcus aureus NEGATIVE NEGATIVE Final    Comment: (NOTE) The Xpert SA Assay (FDA approved for NASAL specimens in patients 61 years of age and older), is one  component of a comprehensive surveillance program. It is not intended to diagnose infection nor to guide or monitor treatment. Performed at  [facility], [address] [place], [place] [address]          Radiology Studies: DG Chest 2 View  Result Date: 12/01/2020 CLINICAL DATA:  Shortness of breath. EXAM: CHEST - 2 VIEW COMPARISON:  November 29, 2020 FINDINGS: The lungs are hyperinflated. Persistent areas of bibasilar atelectasis and/or infiltrate are noted. Mild, stable left-sided volume loss is seen. There is no evidence of a pleural effusion or pneumothorax. The heart size and mediastinal contours are within normal limits. The visualized skeletal structures are unremarkable. IMPRESSION: Persistent bibasilar atelectasis and/or infiltrate. Electronically Signed   By: Aram Candelahaddeus  Houston M.D.   On: 12/01/2020 20:38        Scheduled Meds: .  stroke: mapping our early stages of recovery book   Does not apply Once  . aspirin  81 mg Oral Daily   Or  . aspirin  300 mg Rectal Daily  . Chlorhexidine Gluconate Cloth  6 each Topical Daily  . clopidogrel  75 mg Oral Daily  . docusate  100 mg Oral BID  . enoxaparin (LOVENOX) injection  40 mg Subcutaneous Q24H  . famotidine  20 mg Oral Daily  . feeding supplement  237 mL Oral TID BM  . ferrous sulfate  325 mg Oral TID PC  . fluticasone furoate-vilanterol  1 puff Inhalation Daily  . multivitamin with minerals  1 tablet Oral Daily  . pantoprazole  40 mg Oral Daily  .  senna  1 tablet Oral BID   Continuous Infusions: . sodium chloride 10 mL/hr at 11/30/20 1640  . ampicillin-sulbactam (UNASYN) IV 1.5 g (12/03/20 1147)     LOS: 6 days      Kathlen ModyVijaya Deborh Pense, MD [facility]   To contact the attending provider between 7A-7P or the covering provider during after hours 7P-7A, please log into the web site [email or web address] and access using universal [place] password for that web site. If you do not have the password, please call the hospital operator.  12/03/2020, 2:29 PM

## 2020-12-03 NOTE — Progress Notes (Addendum)
STROKE TEAM PROGRESS NOTE    Interval History   No acute events overnight. Neurological examination is stable.   I spoke to Dr. [name] who feels that he is not continuing further diagnostic cerebral angiogram or angioplasty stenting given patient's frail general medical condition at the present time.  Pertinent Lab Work and Imaging    11/29/20 CT Head WO IV Contrast 1. No acute abnormality and no change from 2 days prior 2. ASPECTS is 10 3. Atrophy and chronic ischemic changes, stable from the prior Study.  11/29/20 CT Angio Head and Neck W WO IV Contrast 1. 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 2. Approximately 15 mm area penumbra/mismatch in the posterior right MCA territory, which likely is in the territory supplied by the M2 MCA stenosis described above. No core infarct identified. 3. Streak artifact limits evaluation for right MCA in-stent stenosis; however, there is opacification proximal and distal to the stent, similar to prior and consistent with stent patency. 4. Redemonstrated flow diverter device within the M1 right MCA. 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 5. Similar 1-2 mm anteriorly projecting outpouching arising from the anterior communicating artery, which may represent a small aneurysm versus infundibulum with small associated vessel not seen.  11/29/20 MRI Brain WO IV Contrast 1. Area of restricted diffusion involving the posterior aspect of the right insula and right frontoparietal region, corresponding to area of increased T-max on prior CT perfusion, consistent with acute/subacute infarct. 2. Remote infarct in the right basal ganglia region. 3. Moderate chronic small vessel ischemia. 4. Left maxillary sinus disease with small fluid level. Correlate for acute sinusitis. 5. Bilateral mastoid effusion.  11/19/20 Echocardiogram Complete  1. Left  ventricular ejection fraction, by estimation, is 65 to 70%. The  left ventricle has normal function. The left ventricle has no regional  wall motion abnormalities. Left ventricular diastolic parameters are  indeterminate.  2. Right ventricular systolic function is normal. The right ventricular  size is normal. There is moderately elevated pulmonary artery systolic  pressure.  3. A small pericardial effusion is present. The pericardial effusion is  circumferential. There is no evidence of cardiac tamponade.  4. The mitral valve is normal in structure. Trivial mitral valve  regurgitation. No evidence of mitral stenosis.  5. The aortic valve is normal in structure. Aortic valve regurgitation is  trivial. No aortic stenosis is present.  6. The inferior vena cava is dilated in size with >50% respiratory  variability, suggesting right atrial pressure of 8 mmHg.   Physical Examination   Constitutional: Frail elderly Caucasian lady not in distress Cardiovascular: Normal RR Respiratory: No increased WOB   Mental status: Oriented to self and year. Not oriented to location or month. Follows commands ( make fist/open, close eyes/open show two fingers) Speech: Fluent w/repetition and naming intact  Cranial nerves: EOMI, VFF, Face appears symmetric, Tongue midline Motor: Normal bulk and tone. RUE 5/5 LUE 3/5 RLE 4/5 LLE 3/5 Drift to LUE + LLE Sensory: Difficult to assess, sensation seems to be intact to light touch bilaterally  Coordination: FNF intact  Gait: Deferred    NIHSS:  1a Level of Conscious: 0  1b LOC Questions: 0 1c LOC Commands: 0 2 Best Gaze: 0 3 Visual: 0 4 Facial Palsy: 0 5a Motor Arm - left: 1 5b Motor Arm - Right: 0 6a Motor Leg - Left: 1 6b Motor Leg - Right:  0 7 Limb Ataxia: 0 8 Sensory: 0 9 Best Language: 0 10 Dysarthria: 1 11 Extinct. and Inatten: 1  TOTAL: 4  Assessment and Plan   Ms. KIMIMILA TAUZIN is a 80 y.o. female w/pmh of dementia, anxiety,  depression s/p R MCA stent in 2017 complicated by CVA, PVD s/p embolectomy for popliteal occlusions,CKD 3, cerebral aneurysm, COPD, GERD, paroxysmal SVT, orthostatic hypotension, mitral valve prolapse, recent hospitalization from 3/28-4/1 for CAP, fall on 4/2 with resulting left femoral neck fracture for which she underwent left cannulated hip pinning on 4/7 who presents with a R MCA stroke.   #R MCA Stroke  Patient was initially admitted on 4/7, presenting with knee pain following a fall on April 2nd, for which she underwent surgery w/left cannulated hip pinning that was done on day of admission. On 4/8 the day following surgery she developed left sided weakness w/LKW 1050, sx onset 1102. A code stroke was called. CTH w/no acute abnormality. CTA Head and Neck pertinent for focal high grade stenosis of a right proximal M2 MCA branch with distal opacification, new from prior. Streak artifact limits evaluation for right MCA in-stent stenosis; however, there is opacification proximal and distal to the stent, similar to prior and consistent with stent patency. Echo done 11/19/20 w/EF 65 to 70 %, LA normal in size, no PFO.  Stroke labs w/A1C 5.4, LDL 69. In regards to her stroke etiology, hypoperfusion in the setting of known RMCA stenosis was considered however per review of blood pressure trends she did not become hypotensive at the time of surgery. More than likely her R MCA stroke is in the setting of new right M2 MCA high grade stenosis.  - Continue DAPT for secondary stroke prevention purposes  - LDL 69, will start Rosuvastatin 10 mg QD  - DSA declined by Dr. [name]  #Hypertension She has a history of HTN and takes Amlodipine 5 mg QD + Losartan 25 mg QD at home. Given stenosis are recommending permissive HTN up to 180. SBP trending 130-180.   - Do not resume home anti hypertensives to allow for permissive HTN   #Hyperlipidemia From a stroke prevention stand point, the LDL goal is < 70. LDL 69, at  goal, given atherosclerosis will start Rosuvastatin 10 mg QD   Hospital day # 6      No neurological changes.  Patient lying comfortably in bed.  No current plans for diagnostic angiogram or repeat angioplasty as per Dr. [name].  Continue dual antiplatelet therapy and aggressive risk factor modification.  Stroke team will sign off.  Kindly call for questions..  Family has agreed to DNR after discussion with palliative care team would want to continue therapy and support for the next few days.  If condition declines further they may be willing for comfort care.  Discussed with Dr. Blake Divine.  Kindly call for questions. Delia Heady, MD Medical Director [facility] Pager: [phone] 12/03/2020 2:21 PM       To contact Stroke Continuity provider, please refer to [email or web address]. After hours, contact General Neurology

## 2020-12-03 NOTE — Progress Notes (Signed)
PT Cancellation Note  Patient Details Name: ADDISSON FRATE MRN: [id number] DOB: 07-31-1941   Cancelled Treatment:    Reason Eval/Treat Not Completed: (P) Medical issues which prohibited therapy (Pt now with plans for home with hopsice, will defer PT tx and asked MD to d/c order.)   Sury Wentworth Artis Delay 12/03/2020, 4:51 PM  Bonney Leitz , PTA Acute Rehabilitation Services Pager [phone] Office [phone]

## 2020-12-03 NOTE — Progress Notes (Signed)
  Speech Language Pathology Treatment: Dysphagia  Patient Details Name: Barbara Hill MRN: [id number] DOB: 12-01-1940 Today's Date: 12/03/2020 Time: 6063-0160 SLP Time Calculation (min) (ACUTE ONLY): 20 min  Assessment / Plan / Recommendation Clinical Impression  Second session provided as dentures were located and RN indicated pt wanted solids.  SlP obtained adhesive from rehab office and placed pt's denture - which effectively provided seal.  Faciliated po intake by hand over hand assist with intake of thin Pepsi, yogurt and graham crackers dipped in yogurt.  She inconsistently performed suction on straw when places between lips - thus SLP often aided intake by use of tip of straw or spoon boluses.   Pt able to hold finger foods in right hand and bring them to her mouth with moini assist.  Prolonged mastication noted and mild oral retention with oral transit faciliated by giving pt yogurt to aid transiting.  She continued to verbalize to people who were not in the room and was reoriented. RN informed SlP that pt is to dc home with hospice tomorrow.  Will follow up next date for family education re: comfort feeding.   Due to pt's inefficient swallow with solids - recommend institutionalized feeding of puree continue and staff and family be able to provide pt with soft whole foods as pt tolerates.  No family present during session - thus will follow up next date for education.    80 year old female with past medical history of dementia, anxiety, depression, s/p R MCA stent in 2017 complicated by CVA, PVD s/p embolectomy for popliteal occlusions, CKD 3, cerebral aneurysm, COPD, GERD, paroxysmal SVT, orthostatic hypotension, mitral valve prolapse, recent hospitalization from 3/28-4/1 for CAP who presented to [facility] for evaluation of knee pain following a fall on April 2.  Per chart, she was found to have a left hip fracture and is now status post surgery with ortho on 4/7.  Developed delirium with left upper  extremity weakness on 4/8 and code stroke was called.  MRI revealed Area of restricted diffusion involving the posterior aspect of the right insula and right frontoparietal region, corresponding to area of increased T-max on prior CT perfusion, consistent with acute/subacute infarct, remote infarct in the right basal ganglia region, moderate chronic small vessel ischemia.  HPI        SLP Plan  Continue with current plan of care       Recommendations  Diet recommendations: Dysphagia 1 (puree);Thin liquid;Nectar-thick liquid (soft food ok from family) Liquids provided via: Cup;Teaspoon Medication Administration: Crushed with puree Supervision: Full supervision/cueing for compensatory strategies;Staff to assist with self feeding (hand over hand assist - right handed) Compensations: Slow rate;Small sips/bites;Other (Comment) (observe pt to swallow before providing more po, oral suction if needed) Postural Changes and/or Swallow Maneuvers: Seated upright 90 degrees;Upright 30-60 min after meal                Oral Care Recommendations: Oral care BID Follow up Recommendations: Skilled Nursing facility SLP Visit Diagnosis: Dysphagia, unspecified (R13.10) Plan: Continue with current plan of care       GO                Chales Abrahams 12/03/2020, 10:11 PM   Rolena Infante, MS [facility] SLP Acute Rehab Services Office [phone] Pager [phone]

## 2020-12-03 NOTE — Progress Notes (Signed)
  Speech Language Pathology Treatment: Dysphagia  Patient Details Name: Barbara Hill MRN: [id number] DOB: 24-Nov-1940 Today's Date: 12/03/2020 Time: 3295-1884 SLP Time Calculation (min) (ACUTE ONLY): 35 min  Assessment / Plan / Recommendation Clinical Impression  Skilled intervention included visit to determine if pt s ready for dietary advancement.  RN reports pt is unhappy with her current diet as she stated earlier "I can't eat any more of this.'  Upon walking into room, pt with mild secretion retention in oral cavity.  Oral care provided using toothbrush with oral suctioning and water.  Pt demonstrates cough with oral care, presumed potential mobilization of secretions.  Cues to cough and "hock" strongly were not effective but pt made attempts.  Pt consumed single ice chips (dipped in Pepsi) with requirement for moderate cues to swallow.  Right labial anterior spillage noted despite max verbal and visual cues and pt required total cues to attempt to maintain her head at a midline position.   Also provided pt with nectar thick Pepsi - to provide her more comfort/enjoyment, both modifications provided comfort and clinically improved her comfort.  GOC meeting plan is pending.    HPI  80 year old female with past medical history of dementia, anxiety, depression, s/p R MCA stent in 2017 complicated by CVA, PVD s/p embolectomy for popliteal occlusions, CKD 3, cerebral aneurysm, COPD, GERD, paroxysmal SVT, orthostatic hypotension, mitral valve prolapse, recent hospitalization from 3/28-4/1 for CAP who presented to [facility] for evaluation of knee pain following a fall on April 2.  Per chart, she was found to have a left hip fracture and is now status post surgery with ortho on 4/7.  Developed delirium with left upper extremity weakness on 4/8 and code stroke was called.  MRI revealed Area of restricted diffusion involving the posterior aspect of the right insula and right frontoparietal region, corresponding to  area of increased T-max on prior CT perfusion, consistent with acute/subacute infarct, remote infarct in the right basal ganglia region, moderate chronic small vessel ischemia.      SLP Plan  Continue with current plan of care       Recommendations  Diet recommendations: Dysphagia 1 (puree);Nectar-thick liquid;Other(comment) (ice chips flavored with pepsi) Liquids provided via: Cup;Teaspoon Medication Administration: Crushed with puree Supervision: Full supervision/cueing for compensatory strategies;Staff to assist with self feeding Compensations: Slow rate;Small sips/bites;Other (Comment) (observe pt to swallow before providing more po, oral suction if needed) Postural Changes and/or Swallow Maneuvers: Seated upright 90 degrees;Upright 30-60 min after meal                Oral Care Recommendations: Oral care BID Follow up Recommendations: Skilled Nursing facility SLP Visit Diagnosis: Dysphagia, unspecified (R13.10) Plan: Continue with current plan of care       GO                Barbara Hill 12/03/2020, 9:49 PM  Rolena Infante, MS [facility] SLP Acute Rehab Services Office [phone] Pager [phone]

## 2020-12-03 NOTE — TOC Progression Note (Signed)
Transition of Care [facility]) - Progression Note    Patient Details  Name: Barbara Hill MRN: [id number] Date of Birth: [birth year]  Transition of Care [facility]) CM/SW Contact  Kermit Balo, RN Phone Number: 12/03/2020, 3:20 PM  Clinical Narrative:    Plan is for home with hospice services tomorrow. CM spoke with patients daughter: [name] over the phone. She is asking to use [facility] at d/c. She is not wanting a hospital bed at this time but is requesting a wheelchair. CM called Tim with [facility] and made referral. He is aware of the wheelchair request.  CM will talk with daughter in am and plan for transport home.   Expected Discharge Plan: Home w Hospice Care Barriers to Discharge: Continued Medical Work up  Expected Discharge Plan and Services Expected Discharge Plan: Home w Hospice Care   Discharge Planning Services: CM Consult Post Acute Care Choice: Hospice Living arrangements for the past 2 months: Single Family Home                             HH Agency:  [facility])         Social Determinants of Health (SDOH) Interventions    Readmission Risk Interventions Readmission Risk Prevention Plan 11/22/2020  Transportation Screening Complete  PCP or Specialist Appt within 3-5 Days Complete  HRI or Home Care Consult Complete  Social Work Consult for Recovery Care Planning/Counseling Complete  Palliative Care Screening Not Applicable  Medication Review Oceanographer) Complete  Some recent data might be hidden

## 2020-12-04 DIAGNOSIS — S72002D Fracture of unspecified part of neck of left femur, subsequent encounter for closed fracture with routine healing: Secondary | ICD-10-CM | POA: Diagnosis not present

## 2020-12-04 MED ORDER — OXYCODONE HCL 5 MG PO TABS: 2.5000 mg | ORAL_TABLET | ORAL | 0 refills | Status: AC | PRN

## 2020-12-04 MED ORDER — CLOPIDOGREL BISULFATE 75 MG PO TABS
75.0000 mg | ORAL_TABLET | Freq: Every day | ORAL | 3 refills | Status: AC
Start: 2020-12-04 — End: ?

## 2020-12-04 MED ORDER — ATORVASTATIN CALCIUM 40 MG PO TABS: 40.0000 mg | ORAL_TABLET | Freq: Every day | ORAL | 3 refills | Status: AC

## 2020-12-04 NOTE — TOC Transition Note (Signed)
Transition of Care [facility]) - CM/SW Discharge Note   Patient Details  Name: Barbara Hill MRN: [id number] Date of Birth: 1940-10-24  Transition of Care [facility]) CM/SW Contact:  Kermit Balo, RN Phone Number: 12/04/2020, 10:47 AM   Clinical Narrative:    Pt is discharging home with continuation of hospice services through [facility]. [facility] aware of d/c home today. CM spoke to pts daughter and she is requesting PT[facility] home. PTAR arranged for 2:30 pick up per daughter request. D/c packet at the desk and bedside RN updated.   Final next level of care: Home w Hospice Care Barriers to Discharge: No Barriers Identified   Patient Goals and CMS Choice   CMS Medicare.gov Compare Post Acute Care list provided to:: Patient Represenative (must comment) Choice offered to / list presented to : Adult Children  Discharge Placement                       Discharge Plan and Services   Discharge Planning Services: CM Consult Post Acute Care Choice: Hospice                      HH Agency:  [facility])        Social Determinants of Health (SDOH) Interventions     Readmission Risk Interventions Readmission Risk Prevention Plan 11/22/2020  Transportation Screening Complete  PCP or Specialist Appt within 3-5 Days Complete  HRI or Home Care Consult Complete  Social Work Consult for Recovery Care Planning/Counseling Complete  Palliative Care Screening Not Applicable  Medication Review Oceanographer) Complete  Some recent data might be hidden

## 2020-12-04 NOTE — Plan of Care (Signed)
Problem: Education: Goal: Knowledge of General Education information will improve Description: Including pain rating scale, medication(s)/side effects and non-pharmacologic comfort measures 12/04/2020 1219 by Drue Dun, RN Outcome: Adequate for Discharge 12/04/2020 1218 by Drue Dun, RN Outcome: Adequate for Discharge   Problem: Health Behavior/Discharge Planning: Goal: Ability to manage health-related needs will improve 12/04/2020 1219 by Drue Dun, RN Outcome: Adequate for Discharge 12/04/2020 1218 by Drue Dun, RN Outcome: Adequate for Discharge   Problem: Clinical Measurements: Goal: Ability to maintain clinical measurements within normal limits will improve 12/04/2020 1219 by Drue Dun, RN Outcome: Adequate for Discharge 12/04/2020 1218 by Drue Dun, RN Outcome: Adequate for Discharge Goal: Will remain free from infection 12/04/2020 1219 by Drue Dun, RN Outcome: Adequate for Discharge 12/04/2020 1218 by Drue Dun, RN Outcome: Adequate for Discharge Goal: Diagnostic test results will improve 12/04/2020 1219 by Drue Dun, RN Outcome: Adequate for Discharge 12/04/2020 1218 by Drue Dun, RN Outcome: Adequate for Discharge Goal: Respiratory complications will improve 12/04/2020 1219 by Drue Dun, RN Outcome: Adequate for Discharge 12/04/2020 1218 by Drue Dun, RN Outcome: Adequate for Discharge Goal: Cardiovascular complication will be avoided 12/04/2020 1219 by Drue Dun, RN Outcome: Adequate for Discharge 12/04/2020 1218 by Drue Dun, RN Outcome: Adequate for Discharge   Problem: Activity: Goal: Risk for activity intolerance will decrease 12/04/2020 1219 by Drue Dun, RN Outcome: Adequate for Discharge 12/04/2020 1218 by Drue Dun, RN Outcome: Adequate for Discharge   Problem: Nutrition: Goal: Adequate nutrition will be maintained 12/04/2020 1219 by Drue Dun,  RN Outcome: Adequate for Discharge 12/04/2020 1218 by Drue Dun, RN Outcome: Adequate for Discharge   Problem: Coping: Goal: Level of anxiety will decrease 12/04/2020 1219 by Drue Dun, RN Outcome: Adequate for Discharge 12/04/2020 1218 by Drue Dun, RN Outcome: Adequate for Discharge   Problem: Elimination: Goal: Will not experience complications related to bowel motility 12/04/2020 1219 by Drue Dun, RN Outcome: Adequate for Discharge 12/04/2020 1218 by Drue Dun, RN Outcome: Adequate for Discharge Goal: Will not experience complications related to urinary retention 12/04/2020 1219 by Drue Dun, RN Outcome: Adequate for Discharge 12/04/2020 1218 by Drue Dun, RN Outcome: Adequate for Discharge   Problem: Pain Managment: Goal: General experience of comfort will improve 12/04/2020 1219 by Drue Dun, RN Outcome: Adequate for Discharge 12/04/2020 1218 by Drue Dun, RN Outcome: Adequate for Discharge   Problem: Safety: Goal: Ability to remain free from injury will improve 12/04/2020 1219 by Drue Dun, RN Outcome: Adequate for Discharge 12/04/2020 1218 by Drue Dun, RN Outcome: Adequate for Discharge   Problem: Skin Integrity: Goal: Risk for impaired skin integrity will decrease 12/04/2020 1219 by Drue Dun, RN Outcome: Adequate for Discharge 12/04/2020 1218 by Drue Dun, RN Outcome: Adequate for Discharge   Problem: Education: Goal: Knowledge of disease or condition will improve 12/04/2020 1219 by Drue Dun, RN Outcome: Adequate for Discharge 12/04/2020 1218 by Drue Dun, RN Outcome: Adequate for Discharge Goal: Knowledge of secondary prevention will improve 12/04/2020 1219 by Drue Dun, RN Outcome: Adequate for Discharge 12/04/2020 1218 by Drue Dun, RN Outcome: Adequate for Discharge Goal: Knowledge of patient specific risk factors addressed and post discharge  goals established will improve 12/04/2020 1219 by Drue Dun, RN Outcome: Adequate for Discharge 12/04/2020 1218 by Drue Dun, RN Outcome: Adequate for Discharge Goal: Individualized Educational Video(s) 12/04/2020 1219 by Drue Dun, RN Outcome: Adequate for Discharge 12/04/2020 1218 by Drue Dun, RN Outcome: Adequate for Discharge   Problem: Coping: Goal: Will verbalize positive feelings about self 12/04/2020 1219 by Sullivan Lone,  Samara Deist, RN Outcome: Adequate for Discharge 12/04/2020 1218 by Drue Dun, RN Outcome: Adequate for Discharge   Problem: Self-Care: Goal: Ability to participate in self-care as condition permits will improve 12/04/2020 1219 by Drue Dun, RN Outcome: Adequate for Discharge 12/04/2020 1218 by Drue Dun, RN Outcome: Adequate for Discharge Goal: Verbalization of feelings and concerns over difficulty with self-care will improve 12/04/2020 1219 by Drue Dun, RN Outcome: Adequate for Discharge 12/04/2020 1218 by Drue Dun, RN Outcome: Adequate for Discharge Goal: Ability to communicate needs accurately will improve 12/04/2020 1219 by Drue Dun, RN Outcome: Adequate for Discharge 12/04/2020 1218 by Drue Dun, RN Outcome: Adequate for Discharge   Problem: Nutrition: Goal: Risk of aspiration will decrease 12/04/2020 1219 by Drue Dun, RN Outcome: Adequate for Discharge 12/04/2020 1218 by Drue Dun, RN Outcome: Adequate for Discharge Goal: Dietary intake will improve 12/04/2020 1219 by Drue Dun, RN Outcome: Adequate for Discharge 12/04/2020 1218 by Drue Dun, RN Outcome: Adequate for Discharge   Problem: Ischemic Stroke/TIA Tissue Perfusion: Goal: Complications of ischemic stroke/TIA will be minimized 12/04/2020 1219 by Drue Dun, RN Outcome: Adequate for Discharge 12/04/2020 1218 by Drue Dun, RN Outcome: Adequate for Discharge

## 2020-12-04 NOTE — Discharge Summary (Signed)
Physician Discharge Summary  Barbara Hill QQI:[id number] DOB: 1941-05-25 DOA: 11/27/2020  PCP: Richmond Campbell., PA-C  Admit date: 11/27/2020 Discharge date: 12/04/2020  Admitted From: Home  Disposition:  Home with Hospice   Recommendations for Outpatient Follow-up:  For Hospice medical team: If patient improves her oral intake, physical function at home, please encourage family to follow up with PCP       Home Health: None  Equipment/Devices: As needed from Hospice  Discharge Condition: Declining, likely less than 6 months prognosis  CODE STATUS: DNR Diet recommendation: Liberal  Brief/Interim Summary: Barbara Hill is a 80 y.o. F with vascular dementia, home dwelling, diffuse cerebrovascular and peripheral vascular disease, CKD stage IIIa, and recent hospitalization for community-acquired pneumonia who presented with leg pain after a fall and was found to have a left hip fracture.Marland Kitchen  Hospitalization complicated by delirium, subsequently complicated by acute left lower extremity weakness, where she was found to have symptomatic right MCA stenosis.  IR were consulted, but given her progressive metabolic encephalopathy, poor functional status, and overall declining health, intervention was deferred.  Palliative care were consulted, and patient and family elected to pursue more supportive cares, defer aggressive aggressive interventions at this time.       PRINCIPAL HOSPITAL DIAGNOSIS: Acute impacted and displaced left femoral neck fracture    Discharge Diagnoses:  Acute impacted and displaced left femoral neck fracture Status post left cannulated hip pinning 4/7 Given aspirin and Plavix was started for stroke, will defer additional DVT prophylaxis.   Acute metabolic encephalopathy due to stroke, surgery  Acute right MCA stroke Patient developed acute stroke symptoms post-operatively.  Neurology, neuro-interventional radiology, and Palliative care were consulted.  Given the  patient's encephalopathy, declining functional status and poor PO intake, aggressive interventions were deferred.  She was discharged on aspirin and Plavix.  She should have close follow up with Hospice providers for contineud goals of care discussions.  My clinical opinion is that she has less than 6 months life expectancy regardless of intervention for her stroke, and that these procedures would more likely detract from her remaining quality of life and expressed wishes to be at home and provide no patient-centric benefits.   CKD stage IIIa  SVT Resume flecainide and antihypertensives for now.  Revisit goals of care as clinical course reveals itself at home.  Hypokalemia and hypomagnesemia Resolved with treatment  Anemia of chronic disease  Hypertension  Aspiration pneumonia Complete course of antibiotics       Discharge Instructions  Discharge Instructions    Discharge instructions   Complete by: As directed    From Dr. Maryfrances Bunnell: You were admitted for a hip fracture.   While you were here, you had a small stroke For this stroke, you should: Continue low dose aspirin 81 mg daily  Start back on clopidogrel/Plavix 75 mg daily Take both of these from now on In addition, it would be prudent to start a statin medicine, I recmmend atorvastatin/Lipitor 40 mg nightly    Lastly, resume all your normal home heart medicines (amlodipine, metoprolol, flecainide, and losartan).  None of these have changed.   Depending on how your mother does over the next few days to weeks, discuss with the providers and nurses from Hospice whether to continue these medicines longer, or to shift to focus on comfort alone   For anxiety, take your normal Xanax  For pain, take oxycodone 2.5 mg (half tab) up to three times daily, but be cautious and do not combine with  Xanax too closely   Discharge wound care:   Complete by: As directed    Cover with simple dressing   Increase activity slowly    Complete by: As directed      Allergies as of 12/04/2020      Reactions   Dobutamine Other (See Comments)   Heart beating hard with CP, neck pain, and weakness   Epinephrine Other (See Comments)   Fast heart beat   Aspirin Nausea And Vomiting   History of stomach ulcer   Clarithromycin Other (See Comments)   Yeast infection   Doxycycline Other (See Comments)   Makes stomach hurt   Excedrin Extra Strength [aspirin-acetaminophen-caffeine] Nausea And Vomiting   Penicillins Other (See Comments)   Yeast infection   Prednisone Other (See Comments)   Shakes   Propoxyphene Nausea And Vomiting      Medication List    STOP taking these medications   cyanocobalamin 1000 MCG tablet   ibuprofen 200 MG tablet Commonly known as: ADVIL   QUEtiapine 25 MG tablet Commonly known as: SEROQUEL     TAKE these medications   acetaminophen 500 MG tablet Commonly known as: TYLENOL Take 1,000 mg by mouth every 6 (six) hours as needed for moderate pain.   ALPRAZolam 0.5 MG tablet Commonly known as: XANAX Take 1 tablet (0.5 mg total) by mouth 3 (three) times daily as needed for anxiety. What changed: how much to take   amLODipine 5 MG tablet Commonly known as: NORVASC TAKE 1 TABLET (5 MG TOTAL) BY MOUTH DAILY.   aspirin 81 MG chewable tablet Chew 1 tablet (81 mg total) by mouth daily.   atorvastatin 40 MG tablet Commonly known as: Lipitor Take 1 tablet (40 mg total) by mouth daily.   budesonide-formoterol 80-4.5 MCG/ACT inhaler Commonly known as: Symbicort Inhale 2 puffs into the lungs 2 (two) times daily.   butalbital-acetaminophen-caffeine 50-325-40 MG tablet Commonly known as: FIORICET Take 1 tablet by mouth every 8 (eight) hours as needed for headache or migraine.   cholecalciferol 1000 units tablet Commonly known as: VITAMIN D Take 1,000 Units by mouth daily.   clopidogrel 75 MG tablet Commonly known as: PLAVIX Take 1 tablet (75 mg total) by mouth daily.   famotidine  20 MG tablet Commonly known as: PEPCID Take 1 tablet (20 mg total) by mouth daily. What changed: when to take this   feeding supplement Liqd Take 237 mLs by mouth 2 (two) times daily between meals.   ferrous sulfate 325 (65 FE) MG tablet Take 1 tablet (325 mg total) by mouth 2 (two) times daily with a meal.   flecainide 50 MG tablet Commonly known as: TAMBOCOR Take 1 tablet (50 mg total) by mouth 2 (two) times daily.   FLUoxetine 20 MG capsule Commonly known as: PROZAC Take 20 mg by mouth 2 (two) times daily.   Foltx 1.13-25-2 MG Tabs Take 1 tablet by mouth daily.   levalbuterol 45 MCG/ACT inhaler Commonly known as: XOPENEX HFA Inhale 2 puffs into the lungs every 4 (four) hours as needed for wheezing. Use 2 times daily x4 days, then every 4 hours as needed.   losartan 25 MG tablet Commonly known as: COZAAR Take 25 mg by mouth daily.   metoprolol succinate 25 MG 24 hr tablet Commonly known as: TOPROL-XL TAKE 1 TABLET BY MOUTH EVERY DAY AT BEDTIME   mirtazapine 7.5 MG tablet Commonly known as: REMERON Take 7.5 mg by mouth at bedtime.   multivitamin with minerals Tabs tablet Take  1 tablet by mouth daily.   omeprazole 40 MG capsule Commonly known as: PRILOSEC Take 40 mg by mouth daily.   oxyCODONE 5 MG immediate release tablet Commonly known as: Oxy IR/ROXICODONE Take 0.5 tablets (2.5 mg total) by mouth every 4 (four) hours as needed for severe pain.   Potassium 99 MG Tabs Take 99 mg by mouth 2 (two) times daily.   Resource ThickenUp Clear Powd Use with every meals.   senna 8.6 MG Tabs tablet Commonly known as: SENOKOT Take 1 tablet (8.6 mg total) by mouth 2 (two) times daily.            Discharge Care Instructions  (From admission, onward)         Start     Ordered   12/04/20 0000  Discharge wound care:       Comments: Cover with simple dressing   12/04/20 1029          Follow-up Information    Teryl Lucy, MD. Schedule an appointment as  soon as possible for a visit in 2 weeks.   Specialty: Orthopedic Surgery Contact information: [address] [place] [address] [phone]        [name], MD Follow up in 3 month(s).   Specialties: Interventional Radiology, Radiology Why: Please plan to follow-up with Dr. [name] for CTA head/neck 3 months after discharge. Our office will call you to set up this imaging scan. Contact information: [address] [place] [address] [phone]        [facility] Follow up.   Why: The hospice agency will contact you for the first home visit. Contact information: [phone]             Allergies  Allergen Reactions  . Dobutamine Other (See Comments)    Heart beating hard with CP, neck pain, and weakness  . Epinephrine Other (See Comments)    Fast heart beat  . Aspirin Nausea And Vomiting    History of stomach ulcer  . Clarithromycin Other (See Comments)    Yeast infection  . Doxycycline Other (See Comments)    Makes stomach hurt  . Excedrin Extra Strength [Aspirin-Acetaminophen-Caffeine] Nausea And Vomiting  . Penicillins Other (See Comments)    Yeast infection  . Prednisone Other (See Comments)    Shakes   . Propoxyphene Nausea And Vomiting    Consultations:  Neurology  Interventional radiology  Palliative Care  Orthopedics   Procedures/Studies: DG Chest 2 View  Result Date: 12/01/2020 CLINICAL DATA:  Shortness of breath. EXAM: CHEST - 2 VIEW COMPARISON:  November 29, 2020 FINDINGS: The lungs are hyperinflated. Persistent areas of bibasilar atelectasis and/or infiltrate are noted. Mild, stable left-sided volume loss is seen. There is no evidence of a pleural effusion or pneumothorax. The heart size and mediastinal contours are within normal limits. The visualized skeletal structures are unremarkable. IMPRESSION: Persistent bibasilar atelectasis and/or infiltrate. Electronically Signed   By: Aram Candela M.D.   On: 12/01/2020 20:38   DG Knee 2 Views Left  Result Date: 11/27/2020 CLINICAL DATA:  Pain following fall EXAM: LEFT KNEE - 1-2 VIEW COMPARISON:  None. FINDINGS: Frontal and lateral views obtained. No fracture or dislocation. No joint effusion. Joint spaces appear normal. No erosive change. IMPRESSION: No fracture, dislocation, or joint effusion. No evident arthropathy. Electronically Signed   By: Bretta Bang III M.D.   On: 11/27/2020 14:20   DG Abd 1 View  Result Date: 11/29/2020 CLINICAL DATA:  Nasogastric tube placement. EXAM: ABDOMEN - 1 VIEW COMPARISON:  None. FINDINGS: Nasogastric tube is seen entering the right mainstem bronchus with distal tip in the right lower lobe. Immediate withdrawal is recommended. IMPRESSION: Nasogastric tube seen in right mainstem bronchus with distal tip in right lower lobe. Immediate removal is recommended. Electronically Signed   By: Lupita Raider M.D.   On: 11/29/2020 14:24   CT Head Wo Contrast  Result Date: 11/27/2020 CLINICAL DATA:  Fall on 11/23/2020. Diffuse left-sided pain. History of dementia. EXAM: CT HEAD WITHOUT CONTRAST CT CERVICAL SPINE WITHOUT CONTRAST TECHNIQUE: Multidetector CT imaging of the head and cervical spine was performed following the standard protocol without intravenous contrast. Multiplanar CT image reconstructions of the cervical spine were also generated. COMPARISON:  Head CT 09/17/2020. Head and neck CTA 09/17/2020. Head MRI 09/18/2020. FINDINGS: CT HEAD FINDINGS Brain: There is no evidence of an acute infarct, intracranial hemorrhage, mass, midline shift, or extra-axial fluid collection. Hypodensities in the cerebral white matter bilaterally are unchanged and nonspecific but compatible with chronic small vessel ischemic disease which is mild for age. Chronic lacunar infarcts in the right basal ganglia are unchanged. There is mild cerebral atrophy. Vascular: Calcified atherosclerosis at the skull base. Right MCA flow  diverter device. Skull: No fracture or suspicious osseous lesion. Sinuses/Orbits: Small volume bubbly fluid in the left maxillary sinus. Persistent small left mastoid effusion. Right cataract extraction. Other: None. CT CERVICAL SPINE FINDINGS Alignment: Chronic mild reversal of the normal cervical lordosis. Unchanged trace retrolisthesis of C2 on C3 and C5 on C6 and grade 1 anterolisthesis of C4 on C5 and C7 on T1. Skull base and vertebrae: No acute fracture or suspicious osseous lesion. Moderate median C1-2 arthropathy. Soft tissues and spinal canal: No prevertebral fluid or swelling. No visible canal hematoma. Disc levels: Cervical disc degeneration most advanced at C5-6 where there is severe disc space narrowing, degenerative endplate sclerosis and spurring, and vacuum disc. Mild disc degeneration at other levels. Severe facet arthrosis on the right at C3-4, on the left at C4-5, and on the left at C7-T1. Mild-to-moderate multilevel neural foraminal stenosis. Upper chest: Clear lung apices. Other: None. IMPRESSION: 1. No evidence of acute intracranial abnormality. 2. Mild chronic small vessel ischemic disease. 3. No acute cervical spine fracture. Electronically Signed   By: Sebastian Ache M.D.   On: 11/27/2020 14:25   CT Cervical Spine Wo Contrast  Result Date: 11/27/2020 CLINICAL DATA:  Fall on 11/23/2020. Diffuse left-sided pain. History of dementia. EXAM: CT HEAD WITHOUT CONTRAST CT CERVICAL SPINE WITHOUT CONTRAST TECHNIQUE: Multidetector CT imaging of the head and cervical spine was performed following the standard protocol without intravenous contrast. Multiplanar CT image reconstructions of the cervical spine were also generated. COMPARISON:  Head CT 09/17/2020. Head and neck CTA 09/17/2020. Head MRI 09/18/2020. FINDINGS: CT HEAD FINDINGS Brain: There is no evidence of an acute infarct, intracranial hemorrhage, mass, midline shift, or extra-axial fluid collection. Hypodensities in the cerebral white matter  bilaterally are unchanged and nonspecific but compatible with chronic small vessel ischemic disease which is mild for age. Chronic lacunar infarcts in the right basal ganglia are unchanged. There is mild cerebral atrophy. Vascular: Calcified atherosclerosis at the skull base. Right MCA flow diverter device. Skull: No fracture or suspicious osseous lesion. Sinuses/Orbits: Small volume bubbly fluid in the left maxillary sinus. Persistent small left mastoid effusion. Right cataract extraction. Other: None. CT CERVICAL SPINE FINDINGS Alignment: Chronic mild reversal of the normal cervical lordosis. Unchanged trace retrolisthesis of C2 on  C3 and C5 on C6 and grade 1 anterolisthesis of C4 on C5 and C7 on T1. Skull base and vertebrae: No acute fracture or suspicious osseous lesion. Moderate median C1-2 arthropathy. Soft tissues and spinal canal: No prevertebral fluid or swelling. No visible canal hematoma. Disc levels: Cervical disc degeneration most advanced at C5-6 where there is severe disc space narrowing, degenerative endplate sclerosis and spurring, and vacuum disc. Mild disc degeneration at other levels. Severe facet arthrosis on the right at C3-4, on the left at C4-5, and on the left at C7-T1. Mild-to-moderate multilevel neural foraminal stenosis. Upper chest: Clear lung apices. Other: None. IMPRESSION: 1. No evidence of acute intracranial abnormality. 2. Mild chronic small vessel ischemic disease. 3. No acute cervical spine fracture. Electronically Signed   By: Sebastian Ache M.D.   On: 11/27/2020 14:25   MR BRAIN WO CONTRAST  Result Date: 11/29/2020 CLINICAL DATA:  Neuro deficit, acute, stroke suspected. Altered mental status. EXAM: MRI HEAD WITHOUT CONTRAST TECHNIQUE: Multiplanar, multiecho pulse sequences of the brain and surrounding structures were obtained without intravenous contrast. COMPARISON:  Head CT November 29, 2020 FINDINGS: Brain: Area of restricted diffusion involving the posterior aspect of the right  insula and right frontoparietal region, consistent with acute/subacute infarct, corresponding to area of increased T-max on prior CT perfusion. No hemorrhage, hydrocephalus, extra-axial collection or mass lesion. Remote infarct in the right basal ganglia region. Scattered and confluent foci of T2 hyperintensity are seen within the white matter of cerebral hemispheres, nonspecific, most likely related to chronic small vessel ischemia. Susceptibility artifact in the anterior right sylvian fissure related to right M1/MCA stent. Vascular: Normal flow voids. Skull and upper cervical spine: Normal marrow signal. Sinuses/Orbits: Mild mucosal thickening of the left maxillary sinus with small fluid level. Right lens surgery. Other: Bilateral mastoid effusion. IMPRESSION: 1. Area of restricted diffusion involving the posterior aspect of the right insula and right frontoparietal region, corresponding to area of increased T-max on prior CT perfusion, consistent with acute/subacute infarct. 2. Remote infarct in the right basal ganglia region. 3. Moderate chronic small vessel ischemia. 4. Left maxillary sinus disease with small fluid level. Correlate for acute sinusitis. 5. Bilateral mastoid effusion. Electronically Signed   By: Baldemar Lenis M.D.   On: 11/29/2020 16:20   Pelvis Portable  Result Date: 11/28/2020 CLINICAL DATA:  Postop. EXAM: PORTABLE PELVIS 1-2 VIEWS COMPARISON:  Preoperative radiograph yesterday. FINDINGS: Three screws traverse left femoral neck fracture. Slightly improved fracture alignment from prior exam. No periprosthetic lucency. Bones diffusely under mineralized. No additional fracture. IMPRESSION: Three screws traverse left femoral neck fracture. No immediate postoperative complication. Electronically Signed   By: Narda Rutherford M.D.   On: 11/28/2020 18:54   CT Hip Left Wo Contrast  Result Date: 11/27/2020 CLINICAL DATA:  Left hip pain after falling today. Abnormal radiographs with  questionable left femoral head fracture. EXAM: CT OF THE LEFT HIP WITHOUT CONTRAST TECHNIQUE: Multidetector CT imaging of the left hip was performed according to the standard protocol. Multiplanar CT image reconstructions were also generated. COMPARISON:  Radiographs 11/27/2020 and 09/17/2020. Pelvic CT 09/17/2020. FINDINGS: Bones/Joint/Cartilage There is a new mildly impacted subcapital fracture of the left femoral neck, best seen on the reformatted images. This demonstrates up to 6 mm of inferior medial displacement on coronal image 33/3. There are underlying degenerative changes of the left hip with joint space narrowing and femoral head osteophytes. No dislocation or fracture of the left hemipelvis. There is a small left hip joint effusion. Ligaments  Suboptimally assessed by CT. Muscles and Tendons Unremarkable. Soft tissues Mild periarticular soft tissue swelling without focal hematoma or foreign body. Iliofemoral atherosclerosis and diffuse sigmoid diverticulosis are noted. IMPRESSION: 1. Acute mildly impacted and displaced fracture of the left femoral neck. 2. Underlying moderate left hip osteoarthritis. 3. No periarticular hematoma. Electronically Signed   By: Carey Bullocks M.D.   On: 11/27/2020 15:21   CT CEREBRAL PERFUSION W CONTRAST  Result Date: 11/29/2020 CLINICAL DATA:  Acute stroke suspected. EXAM: CT HEAD WITHOUT CONTRAST CT ANGIOGRAPHY HEAD AND NECK CT PERFUSION BRAIN TECHNIQUE: Multidetector CT imaging of the head and neck was performed using the standard protocol during bolus administration of intravenous contrast. Multiplanar CT image reconstructions and MIPs were obtained to evaluate the vascular anatomy. Carotid stenosis measurements (when applicable) are obtained utilizing NASCET criteria, using the distal internal carotid diameter as the denominator. Multiphase CT imaging of the brain was performed following IV bolus contrast injection. Subsequent parametric perfusion maps were  calculated using RAPID software. CONTRAST:  19mL OMNIPAQUE IOHEXOL 350 MG/ML SOLN; 81mL OMNIPAQUE IOHEXOL 350 MG/ML SOLN COMPARISON:  CTA 09/17/2020 FINDINGS: CTA NECK FINDINGS Aortic arch: Brachiocephalic and common carotid origins are not imaged. Otherwise, patent great vessels. Right carotid system: No evidence of dissection, significant stenosis (50% or greater) or occlusion. Left carotid system: Left common carotid artery origin is not imaged. No visible dissection, significant stenosis (50% or greater), or occlusion. Vertebral arteries: Codominant. No evidence of dissection, stenosis (50% or greater) or occlusion. Similar multifocal mild stenosis of the right V1 vertebral artery. Skeleton: No evidence of acute bony abnormality on limited assessment. Similar alignment. Similar multilevel degenerative change, including moderate degenerative disc disease at C5-C6 with disc height loss, vacuum disc phenomenon and posterior disc osteophyte complex. Other neck: No mass or adenopathy. Upper chest: Emphysema.  Visualized lung apices are clear. Review of the MIP images confirms the above findings CTA HEAD FINDINGS Anterior circulation: Patent bilateral internal carotid arteries with similar calcific atherosclerosis. No evidence of greater than 50% stenosis of the ICAs. Redemonstrated fluid over device within the right M1 MCA. Artifact limits evaluation for in stent stenosis; however, there is flow proximal and distal to the stent, compatible with patency. 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 Left MCA and ACAs are patent. Similar 1-2 mm vascular protrusion projecting anteriorly from the anterior communicating artery Posterior circulation: No evidence large vessel occlusion or proximal hemodynamically significant stenosis. Similar right posterior communicating artery. Venous sinuses: As permitted by contrast  timing, patent. Review of the MIP images confirms the above findings CT Brain Perfusion Findings: CBF (<30%) Volume: 42mL Perfusion (Tmax>6.0s) volume: 49mL Mismatch Volume: 71mL in the posterior right MCA territory, which likely is in the territory supplied by the M2 MCA stenosis described above. Infarction Location:None. IMPRESSION: 1. 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 2. Approximately 15 mm area penumbra/mismatch in the posterior right MCA territory, which likely is in the territory supplied by the M2 MCA stenosis described above. No core infarct identified. 3. Streak artifact limits evaluation for right MCA in-stent stenosis; however, there is opacification proximal and distal to the stent, similar to prior and consistent with stent patency. 4. Redemonstrated flow diverter device within the M1 right MCA. 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 5. Similar 1-2 mm anteriorly projecting  outpouching arising from the anterior communicating artery, which may represent a small aneurysm versus infundibulum with small associated vessel not seen. CTA findings were communicated on 11/29/2020 at 12:08 pm to provider Dr. Otelia Limes via telephones. Perfusion findings communicated on 11/29/2020 at 12:19 pm to provider Dr. Otelia Limes via telephone. Electronically Signed   By: Feliberto Harts MD   On: 11/29/2020 12:31   DG CHEST PORT 1 VIEW  Result Date: 11/29/2020 CLINICAL DATA:  Postoperative hip fracture repair.  Cough. EXAM: PORTABLE CHEST 1 VIEW COMPARISON:  November 27, 2020 FINDINGS: There is consolidation in the left lower lobe. There is ill-defined patchy airspace opacity in the right base. The heart size and pulmonary vascularity are normal. No adenopathy. No bone lesions. Contrast noted in each renal collecting system. IMPRESSION: Airspace consolidation with volume loss left lower lung region. Patchy infiltrate right base.  Suspect bibasilar pneumonia. Underlying aspiration is possible. Heart size normal. Electronically Signed   By: Bretta Bang III M.D.   On: 11/29/2020 16:46   DG Chest Portable 1 View  Result Date: 11/27/2020 CLINICAL DATA:  Left hip fracture. EXAM: PORTABLE CHEST 1 VIEW COMPARISON:  November 18, 2020. FINDINGS: The heart size and mediastinal contours are within normal limits. No pneumothorax or pleural effusion is noted. Mild bibasilar subsegmental atelectasis is noted. The visualized skeletal structures are unremarkable. IMPRESSION: Mild bibasilar subsegmental atelectasis. Electronically Signed   By: Lupita Raider M.D.   On: 11/27/2020 16:45   DG Chest Portable 1 View  Result Date: 11/18/2020 CLINICAL DATA:  Shortness of breath. EXAM: PORTABLE CHEST 1 VIEW COMPARISON:  September 18, 2020. FINDINGS: The heart size and mediastinal contours are within normal limits. No pneumothorax is noted. Mild bibasilar atelectasis or infiltrates are noted. Small left pleural effusion is noted. The visualized skeletal structures are unremarkable. IMPRESSION: Mild bibasilar atelectasis or infiltrates are noted with small left pleural effusion. Followup PA and lateral chest X-ray is recommended in 3-4 weeks following trial of antibiotic therapy to ensure resolution and exclude underlying malignancy. Electronically Signed   By: Lupita Raider M.D.   On: 11/18/2020 14:35   DG C-Arm 1-60 Min-No Report  Result Date: 11/28/2020 Fluoroscopy was utilized by the requesting physician.  No radiographic interpretation.   ECHOCARDIOGRAM COMPLETE  Result Date: 11/19/2020    ECHOCARDIOGRAM REPORT   Patient Name:   LAVRA IMLER Date of Exam: 11/19/2020 Medical Rec #:  [id number]      Height:       63.0 in Accession #:    [id number]     Weight:       101.6 lb Date of Birth:  1940-09-15       BSA:          1.450 m Patient Age:    79 years       BP:           136/82 mmHg Patient Gender: F              HR:           111 bpm. Exam  Location:  Inpatient Procedure: 2D Echo, 3D Echo, Cardiac Doppler and Color Doppler Indications:    I48.91* Unspeicified atrial fibrillation  History:        Patient has prior history of Echocardiogram examinations, most                 recent 01/29/2020. Abnormal ECG and AV block, COPD and Stroke;  Risk Factors:Current Smoker and Hypertension.  Sonographer:    Sheralyn Boatman RDCS Referring Phys: [id number] STEPHEN K CHIU IMPRESSIONS  1. Left ventricular ejection fraction, by estimation, is 65 to 70%. The left ventricle has normal function. The left ventricle has no regional wall motion abnormalities. Left ventricular diastolic parameters are indeterminate.  2. Right ventricular systolic function is normal. The right ventricular size is normal. There is moderately elevated pulmonary artery systolic pressure.  3. A small pericardial effusion is present. The pericardial effusion is circumferential. There is no evidence of cardiac tamponade.  4. The mitral valve is normal in structure. Trivial mitral valve regurgitation. No evidence of mitral stenosis.  5. The aortic valve is normal in structure. Aortic valve regurgitation is trivial. No aortic stenosis is present.  6. The inferior vena cava is dilated in size with >50% respiratory variability, suggesting right atrial pressure of 8 mmHg. FINDINGS  Left Ventricle: Left ventricular ejection fraction, by estimation, is 65 to 70%. The left ventricle has normal function. The left ventricle has no regional wall motion abnormalities. The left ventricular internal cavity size was normal in size. There is  no left ventricular hypertrophy. Left ventricular diastolic parameters are indeterminate. Right Ventricle: The right ventricular size is normal. No increase in right ventricular wall thickness. Right ventricular systolic function is normal. There is moderately elevated pulmonary artery systolic pressure. The tricuspid regurgitant velocity is 2.94 m/s, and with an assumed  right atrial pressure of 15 mmHg, the estimated right ventricular systolic pressure is 49.6 mmHg. Left Atrium: Left atrial size was normal in size. Right Atrium: Right atrial size was normal in size. Pericardium: A small pericardial effusion is present. The pericardial effusion is circumferential. There is no evidence of cardiac tamponade. Mitral Valve: The mitral valve is normal in structure. Trivial mitral valve regurgitation. No evidence of mitral valve stenosis. Tricuspid Valve: The tricuspid valve is normal in structure. Tricuspid valve regurgitation is mild . No evidence of tricuspid stenosis. Aortic Valve: The aortic valve is normal in structure. Aortic valve regurgitation is trivial. No aortic stenosis is present. Pulmonic Valve: The pulmonic valve was normal in structure. Pulmonic valve regurgitation is not visualized. No evidence of pulmonic stenosis. Aorta: The aortic root is normal in size and structure. Venous: The inferior vena cava is dilated in size with greater than 50% respiratory variability, suggesting right atrial pressure of 8 mmHg. IAS/Shunts: No atrial level shunt detected by color flow Doppler.  LEFT VENTRICLE PLAX 2D LVIDd:         3.00 cm LVIDs:         1.70 cm LV PW:         0.99 cm LV IVS:        1.20 cm LVOT diam:     1.90 cm LV SV:         42 LV SV Index:   29 LVOT Area:     2.84 cm  LV Volumes (MOD) LV vol d, MOD A2C: 31.5 ml LV vol d, MOD A4C: 27.1 ml LV vol s, MOD A2C: 11.1 ml LV vol s, MOD A4C: 9.3 ml LV SV MOD A2C:     20.4 ml LV SV MOD A4C:     27.1 ml LV SV MOD BP:      20.6 ml RIGHT VENTRICLE            IVC RV S prime:     9.79 cm/s  IVC diam: 2.20 cm TAPSE (M-mode): 3.4 cm LEFT ATRIUM  Index       RIGHT ATRIUM           Index LA diam:        4.40 cm 3.03 cm/m  RA Area:     10.20 cm LA Vol (A2C):   30.5 ml 21.03 ml/m RA Volume:   15.60 ml  10.76 ml/m LA Vol (A4C):   23.2 ml 16.00 ml/m LA Biplane Vol: 25.8 ml 17.79 ml/m  AORTIC VALVE LVOT Vmax:   87.20 cm/s  LVOT Vmean:  58.500 cm/s LVOT VTI:    0.149 m  AORTA Ao Root diam: 3.10 cm MITRAL VALVE               TRICUSPID VALVE MV Area (PHT): 4.07 cm    TR Peak grad:   34.6 mmHg MV Decel Time: 187 msec    TR Vmax:        294.00 cm/s MV E velocity: 92.15 cm/s                            SHUNTS                            Systemic VTI:  0.15 m                            Systemic Diam: 1.90 cm Chilton Si MD Electronically signed by Chilton Si MD Signature Date/Time: 11/19/2020/4:30:21 PM    Final    DG Hip Port Unilat With Pelvis 1V Left  Result Date: 11/28/2020 CLINICAL DATA:  Post left hip pinning EXAM: DG HIP (WITH OR WITHOUT PELVIS) 1V PORT LEFT COMPARISON:  None. FINDINGS: Screws within the left femoral neck. No complicating feature. Mild degenerative changes in the left hip. IMPRESSION: Pinning of the left hip.  No visible complicating feature. Electronically Signed   By: Charlett Nose M.D.   On: 11/28/2020 18:04   DG HIP OPERATIVE UNILAT W OR W/O PELVIS LEFT  Result Date: 11/28/2020 CLINICAL DATA:  Left hip pinning. EXAM: OPERATIVE LEFT HIP (WITH PELVIS IF PERFORMED) TECHNIQUE: Fluoroscopic spot image(s) were submitted for interpretation post-operatively. COMPARISON:  Preoperative radiograph yesterday. FINDINGS: Three fluoroscopic spot views obtained of the left hip in the operating room in frontal and lateral projections. Two screws traverse femoral neck fracture. Total fluoroscopy time 43 seconds. IMPRESSION: Procedural fluoroscopy after left hip fracture pinning. Electronically Signed   By: Narda Rutherford M.D.   On: 11/28/2020 18:52   DG Hip Unilat W or Wo Pelvis 2-3 Views Left  Result Date: 11/27/2020 CLINICAL DATA:  Pain following fall EXAM: DG HIP (WITH OR WITHOUT PELVIS) 2-3V LEFT COMPARISON:  None. FINDINGS: Frontal pelvis as well as frontal and lateral views of the left hip joint were obtained. There is a subtle lucency in the medial aspect of the left femoral head, concerning for potential  incomplete fracture in this area. No other findings suggesting potential fracture. No dislocation. There is mild symmetric narrowing of each hip joint. No erosive change. IMPRESSION: Questionable incomplete fracture in the medial aspect of the left femoral head. This appearance warrants further assessment with CT or MR of the left hip to further evaluate. MR would be the optimum study of choice to further evaluate, given ability to demonstrate marrow edema with MR. No other findings suggesting potential fracture. No dislocation. Mild symmetric narrowing each hip joint. Electronically Signed  By: Bretta Bang III M.D.   On: 11/27/2020 14:19   CT HEAD CODE STROKE WO CONTRAST  Result Date: 11/29/2020 CLINICAL DATA:  Code stroke. Acute neuro deficit. Rule out stroke. Left-sided weakness. EXAM: CT HEAD WITHOUT CONTRAST TECHNIQUE: Contiguous axial images were obtained from the base of the skull through the vertex without intravenous contrast. COMPARISON:  CT head 11/27/2020 FINDINGS: Brain: Generalized atrophy. Chronic microvascular ischemic change in the white matter. Chronic lacunar infarction in the right head of caudate and adjacent internal capsule, unchanged. Negative for acute infarct, hemorrhage, mass. Vascular: Negative for hyperdense vessel. Stent in the right M1 segment unchanged. Skull: Negative Sinuses/Orbits: Air-fluid level left maxillary sinus. Remaining sinuses clear. Right cataract extraction. No orbital mass. Other: Image quality degraded by motion. ASPECTS [facility] Stroke Program Early CT Score) - Ganglionic level infarction (caudate, lentiform nuclei, internal capsule, insula, M1-M3 cortex): 7 - Supraganglionic infarction (M4-M6 cortex): 3 Total score (0-10 with 10 being normal): 10 IMPRESSION: 1. No acute abnormality and no change from 2 days prior 2. ASPECTS is 10 3. Atrophy and chronic ischemic changes, stable from the prior study. 4. These results were called by telephone at the time of  interpretation on 11/29/2020 at 11:56 am to provider A Shaune Spittle , who verbally acknowledged these results. Electronically Signed   By: Marlan Palau M.D.   On: 11/29/2020 11:57   CT ANGIO HEAD NECK W WO CM (CODE STROKE)  Result Date: 11/29/2020 CLINICAL DATA:  Acute stroke suspected. EXAM: CT HEAD WITHOUT CONTRAST CT ANGIOGRAPHY HEAD AND NECK CT PERFUSION BRAIN TECHNIQUE: Multidetector CT imaging of the head and neck was performed using the standard protocol during bolus administration of intravenous contrast. Multiplanar CT image reconstructions and MIPs were obtained to evaluate the vascular anatomy. Carotid stenosis measurements (when applicable) are obtained utilizing NASCET criteria, using the distal internal carotid diameter as the denominator. Multiphase CT imaging of the brain was performed following IV bolus contrast injection. Subsequent parametric perfusion maps were calculated using RAPID software. CONTRAST:  60mL OMNIPAQUE IOHEXOL 350 MG/ML SOLN; 40mL OMNIPAQUE IOHEXOL 350 MG/ML SOLN COMPARISON:  CTA 09/17/2020 FINDINGS: CTA NECK FINDINGS Aortic arch: Brachiocephalic and common carotid origins are not imaged. Otherwise, patent great vessels. Right carotid system: No evidence of dissection, significant stenosis (50% or greater) or occlusion. Left carotid system: Left common carotid artery origin is not imaged. No visible dissection, significant stenosis (50% or greater), or occlusion. Vertebral arteries: Codominant. No evidence of dissection, stenosis (50% or greater) or occlusion. Similar multifocal mild stenosis of the right V1 vertebral artery. Skeleton: No evidence of acute bony abnormality on limited assessment. Similar alignment. Similar multilevel degenerative change, including moderate degenerative disc disease at C5-C6 with disc height loss, vacuum disc phenomenon and posterior disc osteophyte complex. Other neck: No mass or adenopathy. Upper chest: Emphysema.  Visualized lung apices are  clear. Review of the MIP images confirms the above findings CTA HEAD FINDINGS Anterior circulation: Patent bilateral internal carotid arteries with similar calcific atherosclerosis. No evidence of greater than 50% stenosis of the ICAs. Redemonstrated fluid over device within the right M1 MCA. Artifact limits evaluation for in stent stenosis; however, there is flow proximal and distal to the stent, compatible with patency. 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 Left MCA and ACAs are patent. Similar 1-2 mm vascular protrusion projecting anteriorly from the anterior communicating artery Posterior circulation: No evidence large vessel occlusion  or proximal hemodynamically significant stenosis. Similar right posterior communicating artery. Venous sinuses: As permitted by contrast timing, patent. Review of the MIP images confirms the above findings CT Brain Perfusion Findings: CBF (<30%) Volume: 0mL Perfusion (Tmax>6.0s) volume: 15mL Mismatch Volume: 15mL in the posterior right MCA territory, which likely is in the territory supplied by the M2 MCA stenosis described above. Infarction Location:None. IMPRESSION: 1. 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 2. Approximately 15 mm area penumbra/mismatch in the posterior right MCA territory, which likely is in the territory supplied by the M2 MCA stenosis described above. No core infarct identified. 3. Streak artifact limits evaluation for right MCA in-stent stenosis; however, there is opacification proximal and distal to the stent, similar to prior and consistent with stent patency. 4. Redemonstrated flow diverter device within the M1 right MCA. 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 5. Similar 1-2 mm anteriorly projecting outpouching arising from the anterior  communicating artery, which may represent a small aneurysm versus infundibulum with small associated vessel not seen. CTA findings were communicated on 11/29/2020 at 12:08 pm to provider Dr. Otelia Limes via telephones. Perfusion findings communicated on 11/29/2020 at 12:19 pm to provider Dr. Otelia Limes via telephone. Electronically Signed   By: Feliberto Harts MD   On: 11/29/2020 12:31      Subjective: Mild hip pain.  Mild confusion.  NO fever.  No respiratory symptoms.  Discharge Exam: Vitals:   12/04/20 1100 12/04/20 1656  BP: (!) 169/78 (!) 154/84  Pulse: 77 79  Resp: 20 20  Temp: 99 F (37.2 C) 99.3 F (37.4 C)  SpO2: 95%    Vitals:   12/04/20 0340 12/04/20 0741 12/04/20 1100 12/04/20 1656  BP: (!) 169/76 (!) 178/88 (!) 169/78 (!) 154/84  Pulse: 74 84 77 79  Resp: 19 18 20 20   Temp: 98.5 F (36.9 C) 98.1 F (36.7 C) 99 F (37.2 C) 99.3 F (37.4 C)  TempSrc: Oral Oral Oral Oral  SpO2: 94% 94% 95%   Weight:      Height:        General: Pt is sleeping, but rouses easily, no acute distress, lying in bed  Cardiovascular: RRR, nl S1-S2, no murmurs appreciated.   No LE edema.   Respiratory: Normal respiratory rate and rhythm.  CTAB without rales or wheezes. Abdominal: Abdomen soft and non-tender.  No distension or HSM.   MSK: Very frail Neuro/Psych: Strength symmetric in upper and lower extremities.  Judgment and insight appear impaired.   The results of significant diagnostics from this hospitalization (including imaging, microbiology, ancillary and laboratory) are listed below for reference.     Microbiology: Recent Results (from the past 240 hour(s))  Resp Panel by RT-PCR (Flu A&B, Covid) Nasopharyngeal Swab     Status: None   Collection Time: 11/27/20  3:39 PM   Specimen: Nasopharyngeal Swab; Nasopharyngeal(NP) swabs in vial transport medium  Result Value Ref Range Status   SARS Coronavirus 2 by RT PCR NEGATIVE NEGATIVE Final    Comment: (NOTE) SARS-CoV-2 target nucleic  acids are NOT DETECTED.  The SARS-CoV-2 RNA is generally detectable in upper respiratory specimens during the acute phase of infection. The lowest concentration of SARS-CoV-2 viral copies this assay can detect is 138 copies/mL. A negative result does not preclude SARS-Cov-2 infection and should not be used as the sole basis for treatment or other patient management decisions. A negative result may  occur with  improper specimen collection/handling, submission of specimen other than nasopharyngeal swab, presence of viral mutation(s) within the areas targeted by this assay, and inadequate number of viral copies(<138 copies/mL). A negative result must be combined with clinical observations, patient history, and epidemiological information. The expected result is Negative.  Fact Sheet for Patients:  BloggerCourse.comhttps://www.fda.gov/media/152166/download  Fact Sheet for Healthcare Providers:  SeriousBroker.ithttps://www.fda.gov/media/152162/download  This test is no t yet approved or cleared by the [place] FDA and  has been authorized for detection and/or diagnosis of SARS-CoV-2 by FDA under an Emergency Use Authorization (EUA). This EUA will remain  in effect (meaning this test can be used) for the duration of the COVID-19 declaration under Section 564(b)(1) of the Act, 21 U.S.C.section 360bbb-3(b)(1), unless the authorization is terminated  or revoked sooner.       Influenza A by PCR NEGATIVE NEGATIVE Final   Influenza B by PCR NEGATIVE NEGATIVE Final    Comment: (NOTE) The Xpert Xpress SARS-CoV-2/FLU/RSV plus assay is intended as an aid in the diagnosis of influenza from Nasopharyngeal swab specimens and should not be used as a sole basis for treatment. Nasal washings and aspirates are unacceptable for Xpert Xpress SARS-CoV-2/FLU/RSV testing.  Fact Sheet for Patients: BloggerCourse.comhttps://www.fda.gov/media/152166/download  Fact Sheet for Healthcare Providers: SeriousBroker.ithttps://www.fda.gov/media/152162/download  This  test is not yet approved or cleared by the [place] FDA and has been authorized for detection and/or diagnosis of SARS-CoV-2 by FDA under an Emergency Use Authorization (EUA). This EUA will remain in effect (meaning this test can be used) for the duration of the COVID-19 declaration under Section 564(b)(1) of the Act, 21 U.S.C. section 360bbb-3(b)(1), unless the authorization is terminated or revoked.  Performed at [facility] [place], [address] [place], [place] [address]   Surgical PCR screen     Status: None   Collection Time: 11/28/20 11:58 AM   Specimen: Nasal Mucosa; Nasal Swab  Result Value Ref Range Status   MRSA, PCR NEGATIVE NEGATIVE Final   Staphylococcus aureus NEGATIVE NEGATIVE Final    Comment: (NOTE) The Xpert SA Assay (FDA approved for NASAL specimens in patients 79 years of age and older), is one component of a comprehensive surveillance program. It is not intended to diagnose infection nor to guide or monitor treatment. Performed at [facility], [address] [place], [place] [address]      Labs: BNP (last 3 results) Recent Labs    09/18/20 1527  BNP 974.0*   Basic Metabolic Panel: Recent Labs  Lab 11/28/20 0826 11/29/20 0409 11/30/20 0313 11/30/20 1239 12/01/20 0237 12/02/20 0252 12/02/20 1805  NA 138   < > 135 134* 135 132* 134*  K 3.5   < > 3.2* 2.7* 2.6* 4.0 3.3*  CL 101   < > 99 101 99 100 99  CO2 25   < > 29 27 28 25 28   GLUCOSE 107*   < > 114* 138* 98 95 101*  BUN 20   < > 11 9 8 10 14   CREATININE 0.87   < > 0.74 0.65 0.64 0.76 0.80  CALCIUM 9.6   < > 9.2 8.8* 8.7* 9.0 9.3  MG 1.5*  --   --  1.2*  --  2.1  --    < > = values in this interval not displayed.   Liver Function Tests: No results for input(s): AST, ALT, ALKPHOS, BILITOT, PROT, ALBUMIN in the last 168 hours. No results for input(s): LIPASE, AMYLASE in the last  168 hours. No results for input(s): AMMONIA in the last 168 hours. CBC: Recent  Labs  Lab 11/28/20 0826 11/29/20 0409 11/30/20 0313 11/30/20 1239  WBC 18.6* 18.0* 13.1* 13.4*  NEUTROABS  --   --   --  10.4*  HGB 11.6* 10.2* 10.3* 9.7*  HCT 35.0* 32.1* 30.9* 28.8*  MCV 90.7 94.4 92.0 91.1  PLT 528* 459* 389 377   Cardiac Enzymes: No results for input(s): CKTOTAL, CKMB, CKMBINDEX, TROPONINI in the last 168 hours. BNP: Invalid input(s): POCBNP CBG: Recent Labs  Lab 11/29/20 1133 11/30/20 0609 11/30/20 2133  GLUCAP 100* 118* 120*   D-Dimer No results for input(s): DDIMER in the last 72 hours. Hgb A1c No results for input(s): HGBA1C in the last 72 hours. Lipid Profile No results for input(s): CHOL, HDL, LDLCALC, TRIG, CHOLHDL, LDLDIRECT in the last 72 hours. Thyroid function studies No results for input(s): TSH, T4TOTAL, T3FREE, THYROIDAB in the last 72 hours.  Invalid input(s): FREET3 Anemia work up No results for input(s): VITAMINB12, FOLATE, FERRITIN, TIBC, IRON, RETICCTPCT in the last 72 hours. Urinalysis    Component Value Date/Time   COLORURINE YELLOW 11/18/2020 1728   APPEARANCEUR CLEAR 11/18/2020 1728   APPEARANCEUR Clear 09/08/2012 0030   LABSPEC 1.025 11/18/2020 1728   LABSPEC 1.013 09/08/2012 0030   PHURINE 5.0 11/18/2020 1728   GLUCOSEU NEGATIVE 11/18/2020 1728   GLUCOSEU Negative 09/08/2012 0030   HGBUR MODERATE (A) 11/18/2020 1728   BILIRUBINUR NEGATIVE 11/18/2020 1728   BILIRUBINUR Negative 09/08/2012 0030   KETONESUR NEGATIVE 11/18/2020 1728   PROTEINUR NEGATIVE 11/18/2020 1728   UROBILINOGEN 0.2 01/18/2015 2006   NITRITE NEGATIVE 11/18/2020 1728   LEUKOCYTESUR NEGATIVE 11/18/2020 1728   LEUKOCYTESUR Negative 09/08/2012 0030   Sepsis Labs Invalid input(s): PROCALCITONIN,  WBC,  LACTICIDVEN Microbiology Recent Results (from the past 240 hour(s))  Resp Panel by RT-PCR (Flu A&B, Covid) Nasopharyngeal Swab     Status: None   Collection Time: 11/27/20  3:39 PM   Specimen: Nasopharyngeal Swab; Nasopharyngeal(NP) swabs in vial  transport medium  Result Value Ref Range Status   SARS Coronavirus 2 by RT PCR NEGATIVE NEGATIVE Final    Comment: (NOTE) SARS-CoV-2 target nucleic acids are NOT DETECTED.  The SARS-CoV-2 RNA is generally detectable in upper respiratory specimens during the acute phase of infection. The lowest concentration of SARS-CoV-2 viral copies this assay can detect is 138 copies/mL. A negative result does not preclude SARS-Cov-2 infection and should not be used as the sole basis for treatment or other patient management decisions. A negative result may occur with  improper specimen collection/handling, submission of specimen other than nasopharyngeal swab, presence of viral mutation(s) within the areas targeted by this assay, and inadequate number of viral copies(<138 copies/mL). A negative result must be combined with clinical observations, patient history, and epidemiological information. The expected result is Negative.  Fact Sheet for Patients:  BloggerCourse.com  Fact Sheet for Healthcare Providers:  SeriousBroker.it  This test is no t yet approved or cleared by the [place] FDA and  has been authorized for detection and/or diagnosis of SARS-CoV-2 by FDA under an Emergency Use Authorization (EUA). This EUA will remain  in effect (meaning this test can be used) for the duration of the COVID-19 declaration under Section 564(b)(1) of the Act, 21 U.S.C.section 360bbb-3(b)(1), unless the authorization is terminated  or revoked sooner.       Influenza A by PCR NEGATIVE NEGATIVE Final   Influenza B by PCR NEGATIVE NEGATIVE Final  Comment: (NOTE) The Xpert Xpress SARS-CoV-2/FLU/RSV plus assay is intended as an aid in the diagnosis of influenza from Nasopharyngeal swab specimens and should not be used as a sole basis for treatment. Nasal washings and aspirates are unacceptable for Xpert Xpress SARS-CoV-2/FLU/RSV testing.  Fact  Sheet for Patients: BloggerCourse.com  Fact Sheet for Healthcare Providers: SeriousBroker.it  This test is not yet approved or cleared by the [place] FDA and has been authorized for detection and/or diagnosis of SARS-CoV-2 by FDA under an Emergency Use Authorization (EUA). This EUA will remain in effect (meaning this test can be used) for the duration of the COVID-19 declaration under Section 564(b)(1) of the Act, 21 U.S.C. section 360bbb-3(b)(1), unless the authorization is terminated or revoked.  Performed at [facility], [address], [place], [place] [address]   Surgical PCR screen     Status: None   Collection Time: 11/28/20 11:58 AM   Specimen: Nasal Mucosa; Nasal Swab  Result Value Ref Range Status   MRSA, PCR NEGATIVE NEGATIVE Final   Staphylococcus aureus NEGATIVE NEGATIVE Final    Comment: (NOTE) The Xpert SA Assay (FDA approved for NASAL specimens in patients 4 years of age and older), is one component of a comprehensive surveillance program. It is not intended to diagnose infection nor to guide or monitor treatment. Performed at [facility], [address], [place], [place] [address]      Time coordinating discharge: 25  minutes The [place] controlled substances registry was not reviewed for this patient discharging with Hospice      30 Day Unplanned Readmission Risk Score   Flowsheet Row ED to Hosp-Admission (Current) from 11/27/2020 in [facility]  30 Day Unplanned Readmission Risk Score (%) 28.96 Filed at 12/04/2020 0801     This score is the patient's risk of an unplanned readmission within 30 days of being discharged (0 -100%). The score is based on dignosis, age, lab data, medications, orders, and past utilization.   Low:  0-14.9   Medium: 15-21.9   High: 22-29.9   Extreme: 30 and above           SIGNED:   Alberteen Sam, MD  [facility] 12/04/2020, 8:57 PM

## 2020-12-04 NOTE — Progress Notes (Signed)
Pt medically stable for discharge per MD order. IV and tele removed. Pt belongings: clothing, shoes. [facility] to transport to home. Packet with discharge information and education in patient belongings bag, and also given to transporters.

## 2020-12-20 ENCOUNTER — Encounter: Payer: Medicare Other | Admitting: Counselor

## 2020-12-22 DEATH — deceased

## 2020-12-27 ENCOUNTER — Encounter: Payer: Medicare Other | Admitting: Counselor

## 2020-12-31 ENCOUNTER — Other Ambulatory Visit: Payer: Self-pay | Admitting: Internal Medicine

## 2021-01-30 ENCOUNTER — Ambulatory Visit: Payer: Medicare Other | Admitting: Neurology

## 2021-02-10 ENCOUNTER — Ambulatory Visit: Payer: Medicare Other | Admitting: Neurology
